# Patient Record
Sex: Male | Born: 1990
Health system: Southern US, Community
[De-identification: ages and names within clinical notes are randomized; demographics above are authoritative.]

## PROBLEM LIST (undated history)

## (undated) DIAGNOSIS — M1990 Unspecified osteoarthritis, unspecified site: Secondary | ICD-10-CM

## (undated) DIAGNOSIS — S0990XA Unspecified injury of head, initial encounter: Secondary | ICD-10-CM

## (undated) DIAGNOSIS — F419 Anxiety disorder, unspecified: Secondary | ICD-10-CM

## (undated) DIAGNOSIS — R4689 Other symptoms and signs involving appearance and behavior: Secondary | ICD-10-CM

## (undated) DIAGNOSIS — K5641 Fecal impaction: Secondary | ICD-10-CM

## (undated) DIAGNOSIS — F1910 Other psychoactive substance abuse, uncomplicated: Secondary | ICD-10-CM

## (undated) DIAGNOSIS — F329 Major depressive disorder, single episode, unspecified: Secondary | ICD-10-CM

## (undated) DIAGNOSIS — M549 Dorsalgia, unspecified: Secondary | ICD-10-CM

## (undated) DIAGNOSIS — T401X1A Poisoning by heroin, accidental (unintentional), initial encounter: Secondary | ICD-10-CM

## (undated) DIAGNOSIS — I429 Cardiomyopathy, unspecified: Secondary | ICD-10-CM

## (undated) DIAGNOSIS — J309 Allergic rhinitis, unspecified: Secondary | ICD-10-CM

## (undated) DIAGNOSIS — T1491XA Suicide attempt, initial encounter: Secondary | ICD-10-CM

## (undated) DIAGNOSIS — F32A Depression, unspecified: Secondary | ICD-10-CM

## (undated) DIAGNOSIS — F988 Other specified behavioral and emotional disorders with onset usually occurring in childhood and adolescence: Secondary | ICD-10-CM

## (undated) DIAGNOSIS — K5900 Constipation, unspecified: Secondary | ICD-10-CM

## (undated) DIAGNOSIS — G8929 Other chronic pain: Secondary | ICD-10-CM

## (undated) HISTORY — DX: Other specified behavioral and emotional disorders with onset usually occurring in childhood and adolescence: F98.8

## (undated) HISTORY — PX: FOOT SURGERY: SHX648

## (undated) HISTORY — PX: NO PAST SURGERIES: SHX2092

## (undated) HISTORY — DX: Allergic rhinitis, unspecified: J30.9

---
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
## 2010-07-23 ENCOUNTER — Emergency Department (HOSPITAL_COMMUNITY): Payer: Medicaid Other

## 2010-07-23 ENCOUNTER — Emergency Department (HOSPITAL_COMMUNITY)
Admission: EM | Admit: 2010-07-23 | Discharge: 2010-07-23 | Disposition: A | Discharge status: Home / Self Care | Payer: Medicaid Other | Attending: Emergency Medicine | Admitting: Emergency Medicine

## 2010-07-23 DIAGNOSIS — W138XXA Fall from, out of or through other building or structure, initial encounter: Secondary | ICD-10-CM | POA: Insufficient documentation

## 2010-07-23 DIAGNOSIS — Y92009 Unspecified place in unspecified non-institutional (private) residence as the place of occurrence of the external cause: Secondary | ICD-10-CM | POA: Insufficient documentation

## 2010-07-23 DIAGNOSIS — IMO0002 Reserved for concepts with insufficient information to code with codable children: Secondary | ICD-10-CM | POA: Insufficient documentation

## 2010-07-23 DIAGNOSIS — R109 Unspecified abdominal pain: Secondary | ICD-10-CM | POA: Insufficient documentation

## 2010-07-23 DIAGNOSIS — F17200 Nicotine dependence, unspecified, uncomplicated: Secondary | ICD-10-CM | POA: Insufficient documentation

## 2010-07-23 MED ORDER — IOHEXOL 300 MG/ML  SOLN
100.0000 mL | Freq: Once | INTRAMUSCULAR | Status: AC | PRN
  Administered 2010-07-23: 100 mL via INTRAVENOUS

## 2010-07-24 ENCOUNTER — Emergency Department (HOSPITAL_COMMUNITY): Payer: Medicaid Other

## 2010-07-24 ENCOUNTER — Emergency Department (HOSPITAL_COMMUNITY)
Admission: EM | Admit: 2010-07-24 | Discharge: 2010-07-25 | Disposition: A | Discharge status: Home / Self Care | Payer: Medicaid Other | Attending: Emergency Medicine | Admitting: Emergency Medicine

## 2010-07-24 DIAGNOSIS — S02609A Fracture of mandible, unspecified, initial encounter for closed fracture: Secondary | ICD-10-CM | POA: Insufficient documentation

## 2010-07-24 DIAGNOSIS — M79609 Pain in unspecified limb: Secondary | ICD-10-CM | POA: Insufficient documentation

## 2010-07-24 DIAGNOSIS — W1789XA Other fall from one level to another, initial encounter: Secondary | ICD-10-CM | POA: Insufficient documentation

## 2010-07-25 ENCOUNTER — Emergency Department (HOSPITAL_COMMUNITY)
Admission: EM | Admit: 2010-07-25 | Discharge: 2010-07-25 | Disposition: A | Discharge status: Home / Self Care | Payer: Medicaid Other | Attending: Emergency Medicine | Admitting: Emergency Medicine

## 2010-07-25 DIAGNOSIS — S02609A Fracture of mandible, unspecified, initial encounter for closed fracture: Secondary | ICD-10-CM | POA: Insufficient documentation

## 2010-07-25 DIAGNOSIS — R221 Localized swelling, mass and lump, neck: Secondary | ICD-10-CM | POA: Insufficient documentation

## 2010-07-25 DIAGNOSIS — R51 Headache: Secondary | ICD-10-CM | POA: Insufficient documentation

## 2010-07-25 DIAGNOSIS — W19XXXA Unspecified fall, initial encounter: Secondary | ICD-10-CM | POA: Insufficient documentation

## 2010-07-25 DIAGNOSIS — R220 Localized swelling, mass and lump, head: Secondary | ICD-10-CM | POA: Insufficient documentation

## 2010-07-25 DIAGNOSIS — R209 Unspecified disturbances of skin sensation: Secondary | ICD-10-CM | POA: Insufficient documentation

## 2010-07-25 DIAGNOSIS — R259 Unspecified abnormal involuntary movements: Secondary | ICD-10-CM | POA: Insufficient documentation

## 2010-08-12 NOTE — Consult Note (Signed)
  NAMERITO, LECOMTE              ACCOUNT NO.:  [id number]  MEDICAL RECORD NO.:  [id number]  LOCATION:  [facility]                         FACILITY:  [facility]  PHYSICIAN:  Kristine Garbe. Ezzard Standing, M.D.DATE OF BIRTH:  09-02-1990  DATE OF CONSULTATION:  07/25/2010 DATE OF DISCHARGE:  07/25/2010                                CONSULTATION   REASON FOR CONSULTATION:  Evaluate the patient with mandibular fracture.  BRIEF NOTE:  Philip Thompson is a 20 year old gentleman who was seen at [facility] last night, where he had a CT scan that showed mandible fracture.  He apparently fell while fishing 3 days ago on July 22, 2010, landing on his face.  He was initially seen in the hospital and sent home on pain medicine but because of persistent pain in his jaw, went back to the hospital on July 24, 2010, at which time he had a CT scan which shows bilateral subcondylar fractures, mandibular neck fractures, and a nondisplaced right body fracture.  He presents here for examination and review of the films.  He is otherwise healthy.  He complains of some numbness on the right side of his face as well as pain and discomfort and a little bit trismus opening his mouth.  PHYSICAL EXAMINATION:  He has minimal swelling.  There is no intraoral openings or lacerations along the jaw or no bleeding.  He has a small laceration of his tongue.  He has had several dental extractions but on bite, his teeth lined up fairly well with a good positioning of teeth. The left neck fracture is nondisplaced, right neck fracture has maybe a millimeter of displacement, but is fairly well lined.  The body fracture is cracked, but really no displacement.  IMPRESSION:  Bilateral nondisplaced subcondylar fractures and the right body fracture, nondisplaced.  RECOMMENDATION:  I discussed with Philip Thompson that I thick we can probably treat this conservatively with a liquid diet, antibiotics, and pain medicine, and reviewed this with him.  I  will have him follow up in my office in 1 week for recheck and Panorex.          ______________________________ Kristine Garbe Ezzard Standing, M.D.     CEN/MEDQ  D:  07/25/2010  T:  07/25/2010  Job:  [id number]  Electronically Signed by Dillard Cannon M.D. on 08/12/2010 11:40:01 AM

## 2010-12-12 ENCOUNTER — Other Ambulatory Visit (HOSPITAL_COMMUNITY): Payer: Self-pay | Admitting: Psychiatry

## 2011-06-21 ENCOUNTER — Ambulatory Visit (HOSPITAL_COMMUNITY): Payer: Medicaid Other | Admitting: Physical Therapy

## 2012-04-10 ENCOUNTER — Other Ambulatory Visit: Payer: Self-pay

## 2012-04-10 MED ORDER — CYCLOBENZAPRINE HCL 10 MG PO TABS: 10.0000 mg | ORAL_TABLET | Freq: Three times a day (TID) | ORAL | Status: DC | PRN

## 2012-06-30 ENCOUNTER — Encounter: Payer: Self-pay | Admitting: *Deleted

## 2012-07-05 ENCOUNTER — Ambulatory Visit (INDEPENDENT_AMBULATORY_CARE_PROVIDER_SITE_OTHER): Payer: Medicaid Other | Admitting: Family Medicine

## 2012-07-05 ENCOUNTER — Encounter: Payer: Self-pay | Admitting: Family Medicine

## 2012-07-05 VITALS — BP 120/80 | Wt 173.0 lb

## 2012-07-05 DIAGNOSIS — M549 Dorsalgia, unspecified: Secondary | ICD-10-CM

## 2012-07-05 DIAGNOSIS — G8929 Other chronic pain: Secondary | ICD-10-CM

## 2012-07-05 MED ORDER — CYCLOBENZAPRINE HCL 10 MG PO TABS: 10.0000 mg | ORAL_TABLET | Freq: Three times a day (TID) | ORAL | Status: DC | PRN

## 2012-07-05 MED ORDER — HYDROCODONE-ACETAMINOPHEN 10-325 MG PO TABS: 1.0000 | ORAL_TABLET | Freq: Four times a day (QID) | ORAL | Status: DC | PRN

## 2012-07-05 NOTE — Progress Notes (Signed)
  Subjective:    Patient ID: Philip Thompson, male    DOB: July 06, 1990, 22 y.o.   MRN: [id number]  HPI Patient arrives at office for follow up on muscle spasms. Condition has occurred off and on for a year. They states low back pain he's had MRI in the past as having dramatically changed has constant pressure pain discomfort certain movements make it worse does a lot of physical labor PMH benign family history noncontributory  Review of Systems Negative abdominal pain rectal bleeding hematuria no numbness or tingling down the legs    Objective:   Physical Exam  Lungs clear hearts regular abdomen soft low back moderate tenderness negative straight leg raise reflexes good      Assessment & Plan:  Chronic pain-hydrocodone 4 times daily patient was talked to at length about the importance of following medication not abusing the medication not combining it with other things. He understands he needs to followup in approximately 4 months and adherent to followup here for further medication. Flexeril may be used only at nighttime

## 2012-07-05 NOTE — Patient Instructions (Signed)
As part of your visit today we have covered chronic pain. You have been given prescription for pain medicines. Certain pain medications such as hydrocodone  allow for refills. Other pain medications such as oxycodone require separate prescriptions. Nonetheless, your expected to come in for a office visit before further pain medications are issued.   We will not refill medications or early nor will we give an extended month supply at the end of these prescriptions.It is your responsibility to keep up with medications. They will not be replaced.  It is your responsibility to schedule an office visit in 3-4 months to be seen before you are out of your medication. Do not call our office to request early refills or additional refills.   We are required by law to adhere to regulations. Failure on our part to follow these regulations could jeopardize our prescription license which in turn would cause us not to be able to care for you.  

## 2012-07-07 ENCOUNTER — Ambulatory Visit: Payer: Self-pay | Admitting: Nurse Practitioner

## 2012-07-10 ENCOUNTER — Ambulatory Visit: Payer: Self-pay | Admitting: Nurse Practitioner

## 2012-08-04 ENCOUNTER — Telehealth: Payer: Self-pay | Admitting: Family Medicine

## 2012-08-04 MED ORDER — LACTULOSE 10 GM/15ML PO SOLN: ORAL | Status: DC

## 2012-08-04 NOTE — Telephone Encounter (Signed)
Sent in Lactulose liq one tblspoon bid for three days, then daily 300 ccs to [facility] in [place]. Mom was notified.

## 2012-08-04 NOTE — Telephone Encounter (Signed)
Patient needs something called in for patients constipation. Mom states he is severely backed up. Cannot afford appointment at this time.    [facility] in [place]

## 2012-08-04 NOTE — Telephone Encounter (Signed)
Lactulose liq one tblspoon bid for three days, then daily 300 ccs

## 2012-09-13 ENCOUNTER — Other Ambulatory Visit: Payer: Self-pay | Admitting: *Deleted

## 2012-09-13 MED ORDER — CYCLOBENZAPRINE HCL 10 MG PO TABS: 10.0000 mg | ORAL_TABLET | Freq: Three times a day (TID) | ORAL | Status: DC | PRN

## 2012-11-13 ENCOUNTER — Other Ambulatory Visit: Payer: Self-pay

## 2012-11-13 MED ORDER — CYCLOBENZAPRINE HCL 10 MG PO TABS: 10.0000 mg | ORAL_TABLET | Freq: Three times a day (TID) | ORAL | Status: DC | PRN

## 2012-11-27 ENCOUNTER — Encounter: Payer: Self-pay | Admitting: Family Medicine

## 2012-11-27 ENCOUNTER — Ambulatory Visit (INDEPENDENT_AMBULATORY_CARE_PROVIDER_SITE_OTHER): Payer: Medicaid Other | Admitting: Family Medicine

## 2012-11-27 VITALS — BP 132/78 | Ht 68.0 in | Wt 186.0 lb

## 2012-11-27 DIAGNOSIS — G8929 Other chronic pain: Secondary | ICD-10-CM

## 2012-11-27 DIAGNOSIS — M549 Dorsalgia, unspecified: Secondary | ICD-10-CM

## 2012-11-27 MED ORDER — HYDROCODONE-ACETAMINOPHEN 10-325 MG PO TABS: 1.0000 | ORAL_TABLET | Freq: Four times a day (QID) | ORAL | Status: DC | PRN

## 2012-11-27 MED ORDER — ZOLPIDEM TARTRATE 10 MG PO TABS: 10.0000 mg | ORAL_TABLET | Freq: Every evening | ORAL | Status: DC | PRN

## 2012-11-27 NOTE — Progress Notes (Signed)
  Subjective:    Patient ID: Philip Thompson, male    DOB: June 29, 1990, 22 y.o.   MRN: [id number]  Back Pain The current episode started more than 1 year ago. He has tried muscle relaxant (hydrocodone) for the symptoms.  This patient was seen today for chronic pain  The medication list was reviewed and updated.  Discussion was held with the patient regarding compliance with pain medication. The patient was advised the importance of maintaining medication and not using illegal substances with these. The patient was educated that we can provide 3 monthly scripts for their medication, it is their responsibility to follow the instructions. Discussion was held with the patient to make sure they're not having significant side effects. Patient is aware that pain medications are meant to minimize the severity of the pain to allow their pain levels to improve to allow for better function. They are aware of that pain medications cannot totally remove their pain.    Wants to discuss a medication to help sleep. He stopped trazadone because it gave him nightmares.  We discussed this at length. He would like to try other options. Discussed Ambien including side effects and the need to do but at least 8 hours he agrees to Ambien. He denies abusing medications.  Review of Systems  Musculoskeletal: Positive for back pain.   denies chest pain shortness breath nausea vomiting diarrhea.     Objective:   Physical Exam Lungs are clear hearts regular pulse normal blood pressure good subjective discomfort in his lower back negative straight leg raise       Assessment & Plan:  Chronic back pain-hydrocodone 3 prescriptions were given followup ongoing troubles proper lifting mechanics discussed Insomnia Ambien 10 mg at nighttime as necessary to help sleep devote at least 8 hours toward sleep when using this medicine

## 2012-12-19 ENCOUNTER — Other Ambulatory Visit: Payer: Self-pay | Admitting: *Deleted

## 2012-12-19 MED ORDER — ZOLPIDEM TARTRATE 10 MG PO TABS: 10.0000 mg | ORAL_TABLET | Freq: Every evening | ORAL | Status: DC | PRN

## 2013-02-20 ENCOUNTER — Ambulatory Visit (INDEPENDENT_AMBULATORY_CARE_PROVIDER_SITE_OTHER): Payer: Self-pay | Admitting: Family Medicine

## 2013-02-20 ENCOUNTER — Encounter: Payer: Self-pay | Admitting: Family Medicine

## 2013-02-20 VITALS — BP 132/78 | Ht 67.75 in | Wt 173.0 lb

## 2013-02-20 DIAGNOSIS — G4700 Insomnia, unspecified: Secondary | ICD-10-CM | POA: Insufficient documentation

## 2013-02-20 DIAGNOSIS — M549 Dorsalgia, unspecified: Secondary | ICD-10-CM

## 2013-02-20 DIAGNOSIS — G8929 Other chronic pain: Secondary | ICD-10-CM

## 2013-02-20 MED ORDER — ZOLPIDEM TARTRATE 10 MG PO TABS: 10.0000 mg | ORAL_TABLET | Freq: Every evening | ORAL | Status: DC | PRN

## 2013-02-20 MED ORDER — HYDROCODONE-ACETAMINOPHEN 10-325 MG PO TABS: 1.0000 | ORAL_TABLET | Freq: Four times a day (QID) | ORAL | Status: DC | PRN

## 2013-02-20 MED ORDER — HYDROCODONE-ACETAMINOPHEN 10-325 MG PO TABS
1.0000 | ORAL_TABLET | Freq: Four times a day (QID) | ORAL | Status: DC | PRN
Start: 2013-02-20 — End: 2013-05-23

## 2013-02-20 NOTE — Progress Notes (Signed)
   Subjective:    Patient ID: Philip Thompson, male    DOB: 06-12-1990, 23 y.o.   MRN: [id number]  HPIFollow up back pain. No concerns. Needs refills on pain meds and on ambien.  Patient relates that he does a good job taking his medicines. He denies abusing them. Uses between 3 and 4 per day. He states he is going to try to wean himself off of pain medicines over the next several months. He is interested in flying planes. He also uses Ambien at nighttime to help with sleep. PMH benign chronic pain   Review of Systems  Constitutional: Negative for activity change, appetite change and fatigue.  Respiratory: Negative for cough.   Cardiovascular: Negative for chest pain.  Gastrointestinal: Negative for abdominal pain.  Neurological: Negative for headaches.  Psychiatric/Behavioral: Negative for behavioral problems.       Objective:   Physical Exam  Vitals reviewed. Constitutional: He appears well-nourished.  Cardiovascular: Normal rate, regular rhythm and normal heart sounds.   No murmur heard. Pulmonary/Chest: Effort normal and breath sounds normal.  Musculoskeletal: He exhibits no edema.  Has subjective discomfort in the low back negative straight leg raise good flexibility  Lymphadenopathy:    He has no cervical adenopathy.  Neurological: He is alert.  Psychiatric: His behavior is normal.          Assessment & Plan:  Patient has chronic back pain he will use his pain medicine followup again in 3-4 months he states he is going try to wean himself off of pain medicines. Hopefully they'll be sometime later this year.  Insomnia use Ambien when necessary

## 2013-05-23 ENCOUNTER — Ambulatory Visit (INDEPENDENT_AMBULATORY_CARE_PROVIDER_SITE_OTHER): Payer: Self-pay | Admitting: Family Medicine

## 2013-05-23 ENCOUNTER — Encounter: Payer: Self-pay | Admitting: Family Medicine

## 2013-05-23 VITALS — BP 132/86 | Ht 69.0 in | Wt 180.2 lb

## 2013-05-23 DIAGNOSIS — G8929 Other chronic pain: Secondary | ICD-10-CM

## 2013-05-23 DIAGNOSIS — M549 Dorsalgia, unspecified: Secondary | ICD-10-CM

## 2013-05-23 DIAGNOSIS — J309 Allergic rhinitis, unspecified: Secondary | ICD-10-CM

## 2013-05-23 MED ORDER — HYDROCODONE-ACETAMINOPHEN 10-325 MG PO TABS: 1.0000 | ORAL_TABLET | Freq: Four times a day (QID) | ORAL | Status: DC | PRN

## 2013-05-23 MED ORDER — ZOLPIDEM TARTRATE 10 MG PO TABS: 10.0000 mg | ORAL_TABLET | Freq: Every evening | ORAL | Status: DC | PRN

## 2013-05-23 MED ORDER — FLUTICASONE PROPIONATE 50 MCG/ACT NA SUSP: 2.0000 | Freq: Every day | NASAL | Status: DC

## 2013-05-23 NOTE — Progress Notes (Signed)
   Subjective:    Patient ID: Philip Thompson, male    DOB: [birth year], 23 y.o.   MRN: [id number]  HPI Patient arrives for a follow up on pain management. Patient states he is taking 2-3 hydrocodone a day-has not weaned down yet but open to discussing what you think about this.  Patient stated he would also like something for his allergies.   Review of Systems He denies drowsiness nausea vomiting diarrhea denies numbness just relates low back pain.    Objective:   Physical Exam Lungs are clear heart regular pulse normal subjective discomfort back negative straight leg raise       Assessment & Plan:  Chronic back pain he uses anywhere between 3 and 4 pain medications per day 3 prescriptions for 120 were given patient denies abusing it. He will followup when he needs any prescription. Prescriptions were verified with his drugstore he is feeling them responsibly  Moderate allergy OTC measures measures as well as Flonase discuss

## 2013-06-21 ENCOUNTER — Other Ambulatory Visit: Payer: Self-pay | Admitting: Family Medicine

## 2013-07-25 ENCOUNTER — Ambulatory Visit (INDEPENDENT_AMBULATORY_CARE_PROVIDER_SITE_OTHER): Payer: BC Managed Care – PPO | Admitting: Family Medicine

## 2013-07-25 ENCOUNTER — Encounter: Payer: Self-pay | Admitting: Family Medicine

## 2013-07-25 VITALS — BP 132/70 | Temp 98.0°F | Ht 69.0 in | Wt 175.0 lb

## 2013-07-25 DIAGNOSIS — J329 Chronic sinusitis, unspecified: Secondary | ICD-10-CM

## 2013-07-25 MED ORDER — BENZONATATE 100 MG PO CAPS: 100.0000 mg | ORAL_CAPSULE | Freq: Three times a day (TID) | ORAL | Status: DC | PRN

## 2013-07-25 MED ORDER — CEFPROZIL 500 MG PO TABS: 500.0000 mg | ORAL_TABLET | Freq: Two times a day (BID) | ORAL | Status: DC

## 2013-07-25 NOTE — Progress Notes (Signed)
   Subjective:    Patient ID: Philip Thompson, male    DOB: 1990-11-19, 23 y.o.   MRN: [id number]  Cough This is a new problem. The current episode started in the past 7 days. Associated symptoms include headaches, myalgias and wheezing. He has tried nothing for the symptoms.   Started a week ago,  First began with cough, real bad sor e thro and runnny nose  Felt bad aching and pain and muscle pain and tenderness  No energy  Coughing up phlegm  No one else sick at home    Review of Systems  Respiratory: Positive for cough and wheezing.   Musculoskeletal: Positive for myalgias.  Neurological: Positive for headaches.       Objective:   Physical Exam  Alert moderate malaise. Good hydration. Neck supple. Pharynx erythematous. Tender anterior nodes. Nasal discharge yellowish. Lungs clear. Heart regular in rhythm.      Assessment & Plan:  Impression rhinosinusitis with lymphadenitis plan antibiotics prescribed. Symptomatic care discussed. WSL

## 2013-08-20 ENCOUNTER — Encounter: Payer: Self-pay | Admitting: Family Medicine

## 2013-08-20 ENCOUNTER — Ambulatory Visit (INDEPENDENT_AMBULATORY_CARE_PROVIDER_SITE_OTHER): Payer: BC Managed Care – PPO | Admitting: Family Medicine

## 2013-08-20 VITALS — BP 120/72 | Ht 69.0 in | Wt 171.0 lb

## 2013-08-20 DIAGNOSIS — G8929 Other chronic pain: Secondary | ICD-10-CM

## 2013-08-20 DIAGNOSIS — M549 Dorsalgia, unspecified: Secondary | ICD-10-CM

## 2013-08-20 MED ORDER — HYDROCODONE-ACETAMINOPHEN 10-325 MG PO TABS: 1.0000 | ORAL_TABLET | Freq: Four times a day (QID) | ORAL | Status: DC | PRN

## 2013-08-20 MED ORDER — FLUTICASONE PROPIONATE 50 MCG/ACT NA SUSP: 2.0000 | Freq: Every day | NASAL | Status: DC

## 2013-08-20 MED ORDER — HYDROCODONE-ACETAMINOPHEN 10-325 MG PO TABS
1.0000 | ORAL_TABLET | Freq: Four times a day (QID) | ORAL | Status: DC | PRN
Start: 2013-08-20 — End: 2013-08-20

## 2013-08-20 NOTE — Progress Notes (Signed)
   Subjective:    Patient ID: Philip Thompson, male    DOB: 09/16/1990, 23 y.o.   MRN: [id number]  HPI This patient was seen today for chronic pain  The medication list was reviewed and updated.   -Compliance with pain medication: YES  The patient was advised the importance of maintaining medication and not using illegal substances with these.  Refills needed: YES on Hydrocodone and Flonase  The patient was educated that we can provide 3 monthly scripts for their medication, it is their responsibility to follow the instructions.  Side effects or complications from medications: none  Patient is aware that pain medications are meant to minimize the severity of the pain to allow their pain levels to improve to allow for better function. They are aware of that pain medications cannot totally remove their pain.  Due for UDT ( at least once per year) : this fall        Review of Systems He denies any numbness or radiation down the legs he does relate low back pain    Objective:   Physical Exam  Vitals reviewed. Constitutional: He appears well-nourished.  Cardiovascular: Normal rate, regular rhythm and normal heart sounds.   No murmur heard. Pulmonary/Chest: Effort normal and breath sounds normal.  Musculoskeletal: He exhibits no edema.  Lymphadenopathy:    He has no cervical adenopathy.  Neurological: He is alert.  Psychiatric: His behavior is normal.   Patient has subjective pain and discomfort in his lower back. Patient had abnormal MRI several years ago.      Assessment & Plan:  Chronic back pain this is been stable pain medication is helping continue current pain medication. Prescriptions given followup 3 months.  Patient counseled to quit smoking

## 2013-08-27 ENCOUNTER — Encounter (HOSPITAL_COMMUNITY): Payer: Self-pay | Admitting: Emergency Medicine

## 2013-08-27 ENCOUNTER — Inpatient Hospital Stay (HOSPITAL_COMMUNITY)
Admission: EM | Admit: 2013-08-27 | Discharge: 2013-08-30 | DRG: 917 | Discharge status: Against Medical Advice | Payer: BC Managed Care – PPO | Attending: Internal Medicine | Admitting: Internal Medicine

## 2013-08-27 ENCOUNTER — Emergency Department (HOSPITAL_COMMUNITY): Payer: BC Managed Care – PPO

## 2013-08-27 DIAGNOSIS — F603 Borderline personality disorder: Secondary | ICD-10-CM | POA: Diagnosis present

## 2013-08-27 DIAGNOSIS — F10921 Alcohol use, unspecified with intoxication delirium: Secondary | ICD-10-CM

## 2013-08-27 DIAGNOSIS — F1010 Alcohol abuse, uncomplicated: Secondary | ICD-10-CM | POA: Diagnosis not present

## 2013-08-27 DIAGNOSIS — F1210 Cannabis abuse, uncomplicated: Secondary | ICD-10-CM | POA: Diagnosis present

## 2013-08-27 DIAGNOSIS — F10929 Alcohol use, unspecified with intoxication, unspecified: Secondary | ICD-10-CM | POA: Diagnosis present

## 2013-08-27 DIAGNOSIS — R45851 Suicidal ideations: Secondary | ICD-10-CM

## 2013-08-27 DIAGNOSIS — R45850 Homicidal ideations: Secondary | ICD-10-CM

## 2013-08-27 DIAGNOSIS — R4182 Altered mental status, unspecified: Secondary | ICD-10-CM | POA: Diagnosis not present

## 2013-08-27 DIAGNOSIS — G8929 Other chronic pain: Secondary | ICD-10-CM

## 2013-08-27 DIAGNOSIS — F1994 Other psychoactive substance use, unspecified with psychoactive substance-induced mood disorder: Secondary | ICD-10-CM | POA: Diagnosis not present

## 2013-08-27 DIAGNOSIS — F17200 Nicotine dependence, unspecified, uncomplicated: Secondary | ICD-10-CM | POA: Diagnosis present

## 2013-08-27 DIAGNOSIS — T50904S Poisoning by unspecified drugs, medicaments and biological substances, undetermined, sequela: Secondary | ICD-10-CM

## 2013-08-27 DIAGNOSIS — F988 Other specified behavioral and emotional disorders with onset usually occurring in childhood and adolescence: Secondary | ICD-10-CM | POA: Diagnosis present

## 2013-08-27 DIAGNOSIS — T50901A Poisoning by unspecified drugs, medicaments and biological substances, accidental (unintentional), initial encounter: Principal | ICD-10-CM | POA: Diagnosis present

## 2013-08-27 DIAGNOSIS — T50902A Poisoning by unspecified drugs, medicaments and biological substances, intentional self-harm, initial encounter: Secondary | ICD-10-CM | POA: Diagnosis present

## 2013-08-27 DIAGNOSIS — I10 Essential (primary) hypertension: Secondary | ICD-10-CM | POA: Diagnosis present

## 2013-08-27 DIAGNOSIS — M549 Dorsalgia, unspecified: Secondary | ICD-10-CM

## 2013-08-27 DIAGNOSIS — T40601A Poisoning by unspecified narcotics, accidental (unintentional), initial encounter: Secondary | ICD-10-CM

## 2013-08-27 DIAGNOSIS — Z781 Physical restraint status: Secondary | ICD-10-CM | POA: Diagnosis present

## 2013-08-27 DIAGNOSIS — F1910 Other psychoactive substance abuse, uncomplicated: Secondary | ICD-10-CM | POA: Diagnosis not present

## 2013-08-27 DIAGNOSIS — R4689 Other symptoms and signs involving appearance and behavior: Secondary | ICD-10-CM | POA: Diagnosis present

## 2013-08-27 DIAGNOSIS — G9340 Encephalopathy, unspecified: Secondary | ICD-10-CM | POA: Diagnosis present

## 2013-08-27 DIAGNOSIS — E870 Hyperosmolality and hypernatremia: Secondary | ICD-10-CM | POA: Diagnosis present

## 2013-08-27 DIAGNOSIS — E876 Hypokalemia: Secondary | ICD-10-CM | POA: Diagnosis present

## 2013-08-27 LAB — URINALYSIS, ROUTINE W REFLEX MICROSCOPIC
Bilirubin Urine: NEGATIVE
Glucose, UA: NEGATIVE mg/dL
HGB URINE DIPSTICK: NEGATIVE
Ketones, ur: NEGATIVE mg/dL
LEUKOCYTES UA: NEGATIVE
Nitrite: NEGATIVE
PH: 5.5 (ref 5.0–8.0)
Protein, ur: NEGATIVE mg/dL
SPECIFIC GRAVITY, URINE: 1.01 (ref 1.005–1.030)
Urobilinogen, UA: 0.2 mg/dL (ref 0.0–1.0)

## 2013-08-27 LAB — COMPREHENSIVE METABOLIC PANEL
ALK PHOS: 88 U/L (ref 39–117)
ALT: 13 U/L (ref 0–53)
AST: 13 U/L (ref 0–37)
Albumin: 4 g/dL (ref 3.5–5.2)
Anion gap: 16 — ABNORMAL HIGH (ref 5–15)
BUN: 5 mg/dL — ABNORMAL LOW (ref 6–23)
CO2: 20 mEq/L (ref 19–32)
Calcium: 8.5 mg/dL (ref 8.4–10.5)
Chloride: 111 mEq/L (ref 96–112)
Creatinine, Ser: 0.93 mg/dL (ref 0.50–1.35)
GFR calc Af Amer: >90 mL/min (ref >90)
GLUCOSE: 111 mg/dL — AB (ref 70–99)
POTASSIUM: 3.2 meq/L — AB (ref 3.7–5.3)
Sodium: 147 mEq/L (ref 137–147)
Total Bilirubin: 0.2 mg/dL — ABNORMAL LOW (ref 0.3–1.2)
Total Protein: 6.7 g/dL (ref 6.0–8.3)

## 2013-08-27 LAB — LIPASE, BLOOD: Lipase: 38 U/L (ref 11–59)

## 2013-08-27 LAB — CBC WITH DIFFERENTIAL/PLATELET
Basophils Absolute: 0 10*3/uL (ref 0.0–0.1)
Basophils Relative: 0 % (ref 0–1)
EOS ABS: 0 10*3/uL (ref 0.0–0.7)
Eosinophils Relative: 0 % (ref 0–5)
HEMATOCRIT: 43.7 % (ref 39.0–52.0)
Hemoglobin: 14.8 g/dL (ref 13.0–17.0)
LYMPHS ABS: 1.1 10*3/uL (ref 0.7–4.0)
Lymphocytes Relative: 16 % (ref 12–46)
MCH: 28.8 pg (ref 26.0–34.0)
MCHC: 33.9 g/dL (ref 30.0–36.0)
MCV: 85.2 fL (ref 78.0–100.0)
Monocytes Absolute: 0.7 10*3/uL (ref 0.1–1.0)
Monocytes Relative: 10 % (ref 3–12)
Neutro Abs: 4.9 10*3/uL (ref 1.7–7.7)
Neutrophils Relative %: 74 % (ref 43–77)
PLATELETS: 161 10*3/uL (ref 150–400)
RBC: 5.13 MIL/uL (ref 4.22–5.81)
RDW: 13.6 % (ref 11.5–15.5)
WBC: 6.7 10*3/uL (ref 4.0–10.5)

## 2013-08-27 LAB — RAPID URINE DRUG SCREEN, HOSP PERFORMED
Amphetamines: NOT DETECTED
Barbiturates: NOT DETECTED
Benzodiazepines: NOT DETECTED
Cocaine: NOT DETECTED
Opiates: NOT DETECTED
TETRAHYDROCANNABINOL: POSITIVE — AB

## 2013-08-27 LAB — ETHANOL: ALCOHOL ETHYL (B): 255 mg/dL — AB (ref 0–11)

## 2013-08-27 MED ORDER — SODIUM CHLORIDE 0.9 % IV SOLN
INTRAVENOUS | Status: DC
  Administered 2013-08-27: 23:00:00 via INTRAVENOUS

## 2013-08-27 MED ORDER — ZIPRASIDONE MESYLATE 20 MG IM SOLR
20.0000 mg | Freq: Once | INTRAMUSCULAR | Status: AC
  Administered 2013-08-27: 20 mg via INTRAMUSCULAR

## 2013-08-27 MED ORDER — ZIPRASIDONE MESYLATE 20 MG IM SOLR
INTRAMUSCULAR | Status: AC
  Administered 2013-08-27: 20 mg via INTRAMUSCULAR
  Filled 2013-08-27: qty 20

## 2013-08-27 MED ORDER — STERILE WATER FOR INJECTION IJ SOLN
INTRAMUSCULAR | Status: AC
  Filled 2013-08-27: qty 10

## 2013-08-27 MED ORDER — SODIUM CHLORIDE 0.9 % IV BOLUS (SEPSIS)
500.0000 mL | Freq: Once | INTRAVENOUS | Status: AC
  Administered 2013-08-27: 500 mL via INTRAVENOUS

## 2013-08-27 MED ORDER — LORAZEPAM 2 MG/ML IJ SOLN
2.0000 mg | Freq: Once | INTRAMUSCULAR | Status: AC
  Administered 2013-08-28: 2 mg via INTRAVENOUS
  Filled 2013-08-27: qty 1

## 2013-08-27 MED ORDER — STERILE WATER FOR INJECTION IJ SOLN
INTRAMUSCULAR | Status: AC
  Administered 2013-08-27: 22:00:00
  Filled 2013-08-27: qty 10

## 2013-08-27 NOTE — ED Notes (Signed)
Pt continues to be aggressive and fighting in the bed. EDP made aware. Stated no more sedation at this time.

## 2013-08-27 NOTE — ED Notes (Signed)
Pt not following commands; responsive to pain. Will not open eyes. Pt aggressive in the bed, kicking and throwing arms. Sheriff's officer has pt in shackles and handcuffed to the bed with spit guard. . No known or observed drug ingestion. Per officer: pt's family reports pt threatening to kill himself and others.

## 2013-08-27 NOTE — ED Notes (Signed)
Pt becoming more verbal. Language is incomprehensible with occasional word coming through "Ya'' got me in [place] jail." Will not follow commands or open eyes to verbal stimuli. Still fighting in the stretcher. Unable to calm pt. Sheriff's officer and security remain at bedside with pt in four point handcuff restraints by Emergency planning/management officer.

## 2013-08-27 NOTE — ED Notes (Signed)
Per EMS: called in by neighbor for possible overdose. Pt combative, not verbally responsive. Escorted by Engineer, manufacturing systems in [place]. Hx of coccaine use. EMS gave 1 mg of narcan with no response. Per sheriff's officer: pt has been "acting out all night" per the father. Threatening to kill everyone and then began taking various drugs". ETOH on board.

## 2013-08-27 NOTE — ED Provider Notes (Addendum)
CSN: [id number]     Arrival date & time 08/27/13  2145 History  This chart was scribed for Philip Mulders, MD by Elon Spanner, ED Scribe. This patient was seen in room APA01/APA01 and the patient's care was started at 9:54 PM.   Chief Complaint  Patient presents with  . Altered Mental Status   LEVEL 5 CAVEAT (ALTERED MENTAL STATUS)  The history is provided by the EMS personnel and the police. No language interpreter was used.    HPI Comments: Philip Thompson is a 23 y.o. male brought in by ambulance, who presents to the Emergency Department complaining of altered mental status.  Per EMS, the sheriff's department responded to the patient's home after being called by the patient's neighbor earlier today following constant erratic behavior during which he expressed suicidal ideation with no specific plan as well as homocidal ideation and active threats toward his family members.  EMS gave 1 mg of Narcan with no response.  EMS states that the patient's father noticed some medication missing from the bathroom cabinet and EMS found 3 pill bottles, Flexeril, Norvasc, and Lasix, at the home.  The flexeril was empty.   EMS states the patient is currently intoxicated.  Per the sheriff's department the patient has a history of previous episodes of similar behavior as well as polysubstance abuse including crack/cocaine, alcohol, and opiates.      Past Medical History  Diagnosis Date  . ADD (attention deficit disorder)   . Allergic rhinitis    History reviewed. No pertinent past surgical history. No family history on file. History  Substance Use Topics  . Smoking status: Current Every Day Smoker  . Smokeless tobacco: Not on file  . Alcohol Use: Yes    Review of Systems  Unable to perform ROS: Mental status change      Allergies  Review of patient's allergies indicates no known allergies.  Home Medications   Prior to Admission medications   Medication Sig Start Date End Date Taking?  Authorizing Provider  cyclobenzaprine (FLEXERIL) 10 MG tablet Take 10 mg by mouth 3 (three) times daily as needed for muscle spasms.   Yes Historical Provider, MD  fluticasone (FLONASE) 50 MCG/ACT nasal spray Place 2 sprays into both nostrils daily. 08/20/13   [name], MD  HYDROcodone-acetaminophen (NORCO) 10-325 MG per tablet Take 1 tablet by mouth every 6 (six) hours as needed. 08/20/13   [name], MD   BP 95/61  Pulse 125  Temp(Src) 99.1 F (37.3 C) (Core (Comment))  Resp 28  SpO2 95% Physical Exam  Nursing note and vitals reviewed. Constitutional: He appears well-developed and well-nourished. No distress.  AGitated.   HENT:  Head: Normocephalic and atraumatic.  Eyes: Conjunctivae and EOM are normal.  Neck: Neck supple. No tracheal deviation present.  Cardiovascular: Normal rate and regular rhythm.   Tachycardic  Pulmonary/Chest: Effort normal and breath sounds normal. No respiratory distress. He has no wheezes. He has no rales.  Abdominal: Bowel sounds are normal. He exhibits no distension.  Flat  Musculoskeletal: Normal range of motion. He exhibits no edema.  Neurological: No cranial nerve deficit. He exhibits normal muscle tone. Coordination normal.  Skin: Skin is warm and dry. No rash noted.  Psychiatric: He has a normal mood and affect. His behavior is normal.    ED Course  Procedures (including critical care time)  DIAGNOSTIC STUDIES: Oxygen Saturation is 98% on RA, normal by my interpretation.    COORDINATION OF CARE:  10:08 PM  Will order labs and imaging.   Labs Review Labs Reviewed  COMPREHENSIVE METABOLIC PANEL - Abnormal; Notable for the following:    Potassium 3.2 (*)    Glucose, Bld 111 (*)    BUN 5 (*)    Total Bilirubin 0.2 (*)    Anion gap 16 (*)    All other components within normal limits  ETHANOL - Abnormal; Notable for the following:    Alcohol, Ethyl (B) 255 (*)    All other components within normal limits  URINE RAPID DRUG SCREEN  (HOSP PERFORMED) - Abnormal; Notable for the following:    Tetrahydrocannabinol POSITIVE (*)    All other components within normal limits  URINALYSIS, ROUTINE W REFLEX MICROSCOPIC  LIPASE, BLOOD  CBC WITH DIFFERENTIAL  ACETAMINOPHEN LEVEL  BLOOD GAS, ARTERIAL  SALICYLATE LEVEL   Results for orders placed during the hospital encounter of 08/27/13  URINALYSIS, ROUTINE W REFLEX MICROSCOPIC      Result Value Ref Range   Color, Urine YELLOW  YELLOW   APPearance CLEAR  CLEAR   Specific Gravity, Urine 1.010  1.005 - 1.030   pH 5.5  5.0 - 8.0   Glucose, UA NEGATIVE  NEGATIVE mg/dL   Hgb urine dipstick NEGATIVE  NEGATIVE   Bilirubin Urine NEGATIVE  NEGATIVE   Ketones, ur NEGATIVE  NEGATIVE mg/dL   Protein, ur NEGATIVE  NEGATIVE mg/dL   Urobilinogen, UA 0.2  0.0 - 1.0 mg/dL   Nitrite NEGATIVE  NEGATIVE   Leukocytes, UA NEGATIVE  NEGATIVE  COMPREHENSIVE METABOLIC PANEL      Result Value Ref Range   Sodium 147  137 - 147 mEq/L   Potassium 3.2 (*) 3.7 - 5.3 mEq/L   Chloride 111  96 - 112 mEq/L   CO2 20  19 - 32 mEq/L   Glucose, Bld 111 (*) 70 - 99 mg/dL   BUN 5 (*) 6 - 23 mg/dL   Creatinine, Ser 1.610.93  0.50 - 1.35 mg/dL   Calcium 8.5  8.4 - 09.610.5 mg/dL   Total Protein 6.7  6.0 - 8.3 g/dL   Albumin 4.0  3.5 - 5.2 g/dL   AST 13  0 - 37 U/L   ALT 13  0 - 53 U/L   Alkaline Phosphatase 88  39 - 117 U/L   Total Bilirubin 0.2 (*) 0.3 - 1.2 mg/dL   GFR calc non Af Amer >90  >90 mL/min   GFR calc Af Amer >90  >90 mL/min   Anion gap 16 (*) 5 - 15  LIPASE, BLOOD      Result Value Ref Range   Lipase 38  11 - 59 U/L  CBC WITH DIFFERENTIAL      Result Value Ref Range   WBC 6.7  4.0 - 10.5 K/uL   RBC 5.13  4.22 - 5.81 MIL/uL   Hemoglobin 14.8  13.0 - 17.0 g/dL   HCT 04.543.7  40.939.0 - 81.152.0 %   MCV 85.2  78.0 - 100.0 fL   MCH 28.8  26.0 - 34.0 pg   MCHC 33.9  30.0 - 36.0 g/dL   RDW 91.413.6  78.211.5 - 95.615.5 %   Platelets 161  150 - 400 K/uL   Neutrophils Relative % 74  43 - 77 %   Neutro Abs 4.9  1.7 -  7.7 K/uL   Lymphocytes Relative 16  12 - 46 %   Lymphs Abs 1.1  0.7 - 4.0 K/uL   Monocytes Relative 10  3 - 12 %  Monocytes Absolute 0.7  0.1 - 1.0 K/uL   Eosinophils Relative 0  0 - 5 %   Eosinophils Absolute 0.0  0.0 - 0.7 K/uL   Basophils Relative 0  0 - 1 %   Basophils Absolute 0.0  0.0 - 0.1 K/uL  ETHANOL      Result Value Ref Range   Alcohol, Ethyl (B) 255 (*) 0 - 11 mg/dL  URINE RAPID DRUG SCREEN (HOSP PERFORMED)      Result Value Ref Range   Opiates NONE DETECTED  NONE DETECTED   Cocaine NONE DETECTED  NONE DETECTED   Benzodiazepines NONE DETECTED  NONE DETECTED   Amphetamines NONE DETECTED  NONE DETECTED   Tetrahydrocannabinol POSITIVE (*) NONE DETECTED   Barbiturates NONE DETECTED  NONE DETECTED     Imaging Review Ct Head Wo Contrast  08/27/2013   CLINICAL DATA:  Altered mental status.  EXAM: CT HEAD WITHOUT CONTRAST  TECHNIQUE: Contiguous axial images were obtained from the base of the skull through the vertex without intravenous contrast.  COMPARISON:  CT of the head and cervical spine May 31, 2013  FINDINGS: Motion degraded examination, which required repeat imaging with mild image quality improvement.  The ventricles and sulci are normal. No intraparenchymal hemorrhage, mass effect nor midline shift. No acute large vascular territory infarcts.  No abnormal extra-axial fluid collections. Basal cisterns are patent.  No skull fracture. The included ocular globes and orbital contents are non-suspicious. Mild paranasal sinus mucosal thickening without air-fluid levels. The mastoid air cells are well aerated.  IMPRESSION: Motion degraded examination without acute intracranial process.   Electronically Signed   By: Awilda Metro   On: 08/27/2013 23:33   Dg Chest Port 1 View  08/27/2013   CLINICAL DATA:  Altered mental status. Possible overdose. History of smoking.  EXAM: PORTABLE CHEST - 1 VIEW  COMPARISON:  Chest radiograph performed 05/31/2013  FINDINGS: The lungs are  well-aerated. Mild vascular congestion is noted. There is no evidence of focal opacification, pleural effusion or pneumothorax.  The cardiomediastinal silhouette is within normal limits. No acute osseous abnormalities are seen.  IMPRESSION: Mild vascular congestion noted; lungs remain grossly clear.   Electronically Signed   By: Roanna Raider M.D.   On: 08/27/2013 22:52     EKG Interpretation   Date/Time:  Monday August 27 2013 21:52:20 EDT Ventricular Rate:  156 PR Interval:  127 QRS Duration: 102 QT Interval:  363 QTC Calculation: 585 R Axis:   89 Text Interpretation:  Sinus tachycardia LAE, consider biatrial enlargement  Minimal ST depression, inferior leads Prolonged QT interval Baseline  wander in lead(s) V3 No previous ECGs available Confirmed by Mehkai Gallo   MD, Jonn [phone]) on 08/27/2013 11:08:52 PM      CRITICAL CARE Performed by: Philip Thompson Total critical care time: 30 Critical care time was exclusive of separately billable procedures and treating other patients. Critical care was necessary to treat or prevent imminent or life-threatening deterioration. Critical care was time spent personally by me on the following activities: development of treatment plan with patient and/or surrogate as well as nursing, discussions with consultants, evaluation of patient's response to treatment, examination of patient, obtaining history from patient or surrogate, ordering and performing treatments and interventions, ordering and review of laboratory studies, ordering and review of radiographic studies, pulse oximetry and re-evaluation of patient's condition.    MDM   Final diagnoses:  Alcohol intoxication, with unspecified complication  Overdose, undetermined intent, sequela    Patient arrived very combative. Presumed  substance abuse. Probably not suicide attempt. It was very aggressive to family members and threatening them. Arrived here extremely agitated not mentating properly.  Patient had to be handcuffed to the bed. Patient was monitored IV was established patient was satting above 90% was tachycardic around 140. The a sinus tach on the monitor. Patient was given 20 mg of Geodon. Some sedative effect but not complete. Urine drug screen showed only THC in the system no cocaine. Alcohol was elevated to 55. Patient's EKG had no widening of the QRS. Electrolytes without any significant abnormalities other than mild hypokalemia. Ativan given patient became very chilled. Prior to giving Ativan, patient was starting to verbalize with profanity words. Overall was starting to show signs of improvement. Discussed with hospitalist will be admitted to step down unit. Arterial blood gas is pending for complete sake aspirin and Tylenol levels are pending. No evidence of a metabolic acidosis on electrolytes. No liver function test abnormalities. There was some question that maybe the patient had overdosed on Flexeril however the bottle for that was back in June it wasn't that many pills with salt and consume any pills at all. Police state the patient has done this in the past usually this is when he gets when he is using drugs.  I personally performed the services described in this documentation, which was scribed in my presence. The recorded information has been reviewed and is accurate.      Philip Mulders, MD 08/28/13 [id number]  Philip Mulders, MD 08/28/13 [phone]

## 2013-08-28 ENCOUNTER — Encounter (HOSPITAL_COMMUNITY): Payer: Self-pay

## 2013-08-28 DIAGNOSIS — T50901A Poisoning by unspecified drugs, medicaments and biological substances, accidental (unintentional), initial encounter: Secondary | ICD-10-CM | POA: Diagnosis present

## 2013-08-28 DIAGNOSIS — F1010 Alcohol abuse, uncomplicated: Secondary | ICD-10-CM | POA: Diagnosis not present

## 2013-08-28 DIAGNOSIS — E870 Hyperosmolality and hypernatremia: Secondary | ICD-10-CM | POA: Diagnosis present

## 2013-08-28 DIAGNOSIS — T40601A Poisoning by unspecified narcotics, accidental (unintentional), initial encounter: Secondary | ICD-10-CM

## 2013-08-28 DIAGNOSIS — E876 Hypokalemia: Secondary | ICD-10-CM | POA: Diagnosis present

## 2013-08-28 DIAGNOSIS — R4689 Other symptoms and signs involving appearance and behavior: Secondary | ICD-10-CM | POA: Diagnosis present

## 2013-08-28 DIAGNOSIS — F1210 Cannabis abuse, uncomplicated: Secondary | ICD-10-CM | POA: Diagnosis present

## 2013-08-28 DIAGNOSIS — F17200 Nicotine dependence, unspecified, uncomplicated: Secondary | ICD-10-CM | POA: Diagnosis present

## 2013-08-28 DIAGNOSIS — G9340 Encephalopathy, unspecified: Secondary | ICD-10-CM | POA: Diagnosis present

## 2013-08-28 DIAGNOSIS — F988 Other specified behavioral and emotional disorders with onset usually occurring in childhood and adolescence: Secondary | ICD-10-CM | POA: Diagnosis present

## 2013-08-28 DIAGNOSIS — F1994 Other psychoactive substance use, unspecified with psychoactive substance-induced mood disorder: Secondary | ICD-10-CM | POA: Diagnosis not present

## 2013-08-28 DIAGNOSIS — R4182 Altered mental status, unspecified: Secondary | ICD-10-CM | POA: Diagnosis present

## 2013-08-28 DIAGNOSIS — F1910 Other psychoactive substance abuse, uncomplicated: Secondary | ICD-10-CM | POA: Diagnosis not present

## 2013-08-28 DIAGNOSIS — R45851 Suicidal ideations: Secondary | ICD-10-CM

## 2013-08-28 DIAGNOSIS — F603 Borderline personality disorder: Secondary | ICD-10-CM | POA: Diagnosis present

## 2013-08-28 DIAGNOSIS — R45850 Homicidal ideations: Secondary | ICD-10-CM | POA: Diagnosis not present

## 2013-08-28 DIAGNOSIS — I10 Essential (primary) hypertension: Secondary | ICD-10-CM | POA: Diagnosis present

## 2013-08-28 DIAGNOSIS — F10929 Alcohol use, unspecified with intoxication, unspecified: Secondary | ICD-10-CM | POA: Diagnosis present

## 2013-08-28 DIAGNOSIS — Z781 Physical restraint status: Secondary | ICD-10-CM | POA: Diagnosis present

## 2013-08-28 DIAGNOSIS — T50902A Poisoning by unspecified drugs, medicaments and biological substances, intentional self-harm, initial encounter: Secondary | ICD-10-CM | POA: Diagnosis present

## 2013-08-28 LAB — BLOOD GAS, ARTERIAL
Acid-base deficit: 2.5 mmol/L — ABNORMAL HIGH (ref 0.0–2.0)
Bicarbonate: 22 mEq/L (ref 20.0–24.0)
DRAWN BY: 21310
O2 SAT: 96.2 %
PATIENT TEMPERATURE: 37
PCO2 ART: 38.8 mmHg (ref 35.0–45.0)
TCO2: 19.3 mmol/L (ref 0–100)
pH, Arterial: 7.371 (ref 7.350–7.450)
pO2, Arterial: 96.8 mmHg (ref 80.0–100.0)

## 2013-08-28 LAB — BASIC METABOLIC PANEL
Anion gap: 13 (ref 5–15)
BUN: 4 mg/dL — ABNORMAL LOW (ref 6–23)
CO2: 22 mEq/L (ref 19–32)
Calcium: 8.6 mg/dL (ref 8.4–10.5)
Chloride: 113 mEq/L — ABNORMAL HIGH (ref 96–112)
Creatinine, Ser: 0.82 mg/dL (ref 0.50–1.35)
GFR calc Af Amer: >90 mL/min (ref >90)
GFR calc non Af Amer: >90 mL/min (ref >90)
Glucose, Bld: 97 mg/dL (ref 70–99)
Potassium: 4 mEq/L (ref 3.7–5.3)
Sodium: 148 mEq/L — ABNORMAL HIGH (ref 137–147)

## 2013-08-28 LAB — MRSA PCR SCREENING: MRSA by PCR: POSITIVE — AB

## 2013-08-28 LAB — ACETAMINOPHEN LEVEL

## 2013-08-28 LAB — SALICYLATE LEVEL

## 2013-08-28 MED ORDER — SODIUM CHLORIDE 0.45 % IV SOLN
INTRAVENOUS | Status: DC
  Administered 2013-08-28 – 2013-08-30 (×5): via INTRAVENOUS

## 2013-08-28 MED ORDER — SODIUM CHLORIDE 0.9 % IV SOLN: INTRAVENOUS | Status: DC

## 2013-08-28 MED ORDER — SODIUM CHLORIDE 0.9 % IV SOLN
INTRAVENOUS | Status: DC
  Administered 2013-08-28: 02:00:00 via INTRAVENOUS

## 2013-08-28 MED ORDER — ONDANSETRON HCL 4 MG/2ML IJ SOLN
4.0000 mg | Freq: Once | INTRAMUSCULAR | Status: AC
  Administered 2013-08-28: 4 mg via INTRAVENOUS
  Filled 2013-08-28: qty 2

## 2013-08-28 MED ORDER — FOLIC ACID 1 MG PO TABS
1.0000 mg | ORAL_TABLET | Freq: Every day | ORAL | Status: DC
  Administered 2013-08-28 – 2013-08-30 (×3): 1 mg via ORAL
  Filled 2013-08-28 (×3): qty 1

## 2013-08-28 MED ORDER — VITAMIN B-1 100 MG PO TABS
100.0000 mg | ORAL_TABLET | Freq: Every day | ORAL | Status: DC
  Administered 2013-08-28 – 2013-08-30 (×3): 100 mg via ORAL
  Filled 2013-08-28 (×3): qty 1

## 2013-08-28 MED ORDER — ENOXAPARIN SODIUM 40 MG/0.4ML ~~LOC~~ SOLN
40.0000 mg | SUBCUTANEOUS | Status: DC
  Administered 2013-08-28 – 2013-08-30 (×3): 40 mg via SUBCUTANEOUS
  Filled 2013-08-28 (×3): qty 0.4

## 2013-08-28 MED ORDER — LORAZEPAM 2 MG/ML IJ SOLN
2.0000 mg | INTRAMUSCULAR | Status: DC | PRN
  Administered 2013-08-28: 3 mg via INTRAVENOUS
  Administered 2013-08-28: 2 mg via INTRAVENOUS
  Administered 2013-08-28: 1 mg via INTRAVENOUS
  Administered 2013-08-28 (×3): 2 mg via INTRAVENOUS
  Filled 2013-08-28: qty 2
  Filled 2013-08-28 (×5): qty 1
  Filled 2013-08-28: qty 2

## 2013-08-28 MED ORDER — MUPIROCIN 2 % EX OINT
1.0000 "application " | TOPICAL_OINTMENT | Freq: Two times a day (BID) | CUTANEOUS | Status: DC
  Administered 2013-08-28 – 2013-08-30 (×5): 1 via NASAL
  Filled 2013-08-28 (×2): qty 22

## 2013-08-28 MED ORDER — HALOPERIDOL LACTATE 5 MG/ML IJ SOLN
5.0000 mg | Freq: Four times a day (QID) | INTRAMUSCULAR | Status: DC | PRN
  Administered 2013-08-28: 5 mg via INTRAMUSCULAR
  Filled 2013-08-28: qty 1

## 2013-08-28 MED ORDER — DEXMEDETOMIDINE HCL IN NACL 200 MCG/50ML IV SOLN
0.2000 ug/kg/h | INTRAVENOUS | Status: AC
  Administered 2013-08-28: 0.2 ug/kg/h via INTRAVENOUS
  Administered 2013-08-28 – 2013-08-29 (×4): 0.5 ug/kg/h via INTRAVENOUS
  Filled 2013-08-28 (×5): qty 50

## 2013-08-28 MED ORDER — ADULT MULTIVITAMIN W/MINERALS CH
1.0000 | ORAL_TABLET | Freq: Every day | ORAL | Status: DC
  Administered 2013-08-28 – 2013-08-30 (×3): 1 via ORAL
  Filled 2013-08-28 (×3): qty 1

## 2013-08-28 MED ORDER — CHLORHEXIDINE GLUCONATE CLOTH 2 % EX PADS
6.0000 | MEDICATED_PAD | Freq: Every day | CUTANEOUS | Status: DC
  Administered 2013-08-28 – 2013-08-30 (×3): 6 via TOPICAL

## 2013-08-28 NOTE — ED Notes (Signed)
Per CT tech and security; pt was handcuffed when he went through CT; his hands were placed on his chest and stomach; abrasions noted to his stomach. No bleeding noted.

## 2013-08-28 NOTE — ED Notes (Signed)
Philip Thompson - mother: [id number]

## 2013-08-28 NOTE — ED Notes (Signed)
MD at bedside. 

## 2013-08-28 NOTE — ED Notes (Signed)
Pt resting. Nad noted at present. Occasional movement with verbal grunting.

## 2013-08-28 NOTE — H&P (Signed)
History and Physical    FOCH WEIDA UXY:[id number] DOB: 09-Feb-1990 DOA: 08/27/2013  Referring physician: Dr. Deretha Emory PCP: Lilyan Punt, MD  Specialists: psychiatry   Chief Complaint: AMS  HPI: Philip Thompson is a 23 y.o. male has a past medical history significant for polysubstance abuse, HTN, is being brought by the police after being called by family and neighbors for erratic behavior. Per report, patient has been expressing suicidal and homicidal thoughts and active threats towards family members and this is in the setting of substance abuse. EMS and the police was called and patient was brought to the ER. In the ER he continued to be very aggressive and needed to be cuffed to the bed as he was trying to bite and spit on ER staff, s/p Geodon with temporary improvement. Per report, his family has noticed that some medications from the flexeril bottle were missing but they are not sure how many. He is also on Norvasc and lasix. Patient does have a history of similar behavior in the past.  UDS positive for marijuana, tylenol and salicylate levels normal, ETOH level elevated.   Review of Systems: unable to obtain ROS to acute encephalopathy  Past Medical History  Diagnosis Date  . ADD (attention deficit disorder)   . Allergic rhinitis    History reviewed. No pertinent past surgical history. Social History:  reports that he has been smoking.  He does not have any smokeless tobacco history on file. He reports that he drinks alcohol. He reports that he uses illicit drugs.  No Known Allergies  No family history on file. unable to verify family history  Prior to Admission medications   Medication Sig Start Date End Date Taking? Authorizing Provider  cyclobenzaprine (FLEXERIL) 10 MG tablet Take 10 mg by mouth 3 (three) times daily as needed for muscle spasms.   Yes Historical Provider, MD  fluticasone (FLONASE) 50 MCG/ACT nasal spray Place 2 sprays into both nostrils daily. 08/20/13    [name], MD  HYDROcodone-acetaminophen (NORCO) 10-325 MG per tablet Take 1 tablet by mouth every 6 (six) hours as needed. 08/20/13   [name], MD   Physical Exam: Filed Vitals:   08/28/13 0005 08/28/13 0030 08/28/13 0100 08/28/13 0103  BP:  95/61 106/66   Pulse:  125 115   Temp: 99.1 F (37.3 C)   98 F (36.7 C)  TempSrc: Core (Comment)   Core (Comment)  Resp:  28 24   SpO2:  95% 94%     General:  Sleeping on my evaluation  Cardiovascular: regular rate without MRG  Respiratory: CTA biL on anterior auscultation, no wheezing  Abdomen: non tender to palpation, positive bowel sounds  Skin: no rashes  Musculoskeletal: no peripheral edema  Labs on Admission:  Basic Metabolic Panel:  Recent Labs Lab 08/27/13 2222  NA 147  K 3.2*  CL 111  CO2 20  GLUCOSE 111*  BUN 5*  CREATININE 0.93  CALCIUM 8.5   Liver Function Tests:  Recent Labs Lab 08/27/13 2222  AST 13  ALT 13  ALKPHOS 88  BILITOT 0.2*  PROT 6.7  ALBUMIN 4.0    Recent Labs Lab 08/27/13 2222  LIPASE 38   No results found for this basename: AMMONIA,  in the last 168 hours CBC:  Recent Labs Lab 08/27/13 2222  WBC 6.7  NEUTROABS 4.9  HGB 14.8  HCT 43.7  MCV 85.2  PLT 161   Cardiac Enzymes: No results found for this basename: CKTOTAL, CKMB,  CKMBINDEX, TROPONINI,  in the last 168 hours  BNP (last 3 results) No results found for this basename: PROBNP,  in the last 8760 hours CBG: No results found for this basename: GLUCAP,  in the last 168 hours  Radiological Exams on Admission: Ct Head Wo Contrast  08/27/2013   CLINICAL DATA:  Altered mental status.  EXAM: CT HEAD WITHOUT CONTRAST  TECHNIQUE: Contiguous axial images were obtained from the base of the skull through the vertex without intravenous contrast.  COMPARISON:  CT of the head and cervical spine May 31, 2013  FINDINGS: Motion degraded examination, which required repeat imaging with mild image quality improvement.  The  ventricles and sulci are normal. No intraparenchymal hemorrhage, mass effect nor midline shift. No acute large vascular territory infarcts.  No abnormal extra-axial fluid collections. Basal cisterns are patent.  No skull fracture. The included ocular globes and orbital contents are non-suspicious. Mild paranasal sinus mucosal thickening without air-fluid levels. The mastoid air cells are well aerated.  IMPRESSION: Motion degraded examination without acute intracranial process.   Electronically Signed   By: Awilda Metroourtnay  Bloomer   On: 08/27/2013 23:33   Dg Chest Port 1 View  08/27/2013   CLINICAL DATA:  Altered mental status. Possible overdose. History of smoking.  EXAM: PORTABLE CHEST - 1 VIEW  COMPARISON:  Chest radiograph performed 05/31/2013  FINDINGS: The lungs are well-aerated. Mild vascular congestion is noted. There is no evidence of focal opacification, pleural effusion or pneumothorax.  The cardiomediastinal silhouette is within normal limits. No acute osseous abnormalities are seen.  IMPRESSION: Mild vascular congestion noted; lungs remain grossly clear.   Electronically Signed   By: Roanna RaiderJeffery  Chang M.D.   On: 08/27/2013 22:52    EKG: Independently reviewed. Sinus tachycardia  Assessment/Plan Active Problems:   Overdose   Alcohol intoxication   Physically aggressive behavior   Severely aggressive behavior   Marijuana abuse   Suicidal ideation   Homicidal ideation   #1 Polysubstance abuse - marijuana, ETOH and possible drug overdose of prescription medication - monitor in SDU for now - ABG reassuring  #2 ETOH abuse - monitor on CIWA - patient still very agitated after high doses of Ativan, will use haldol PRN as well. Closely monitor as it may lower seizure threshold, but patient is high risk for harm to self and others.   #3 Aggressive behavious with HI and reported SI - psychiatry consult  Diet: NPO Fluids: none  DVT Prophylaxis: lovenox  Code Status: Full  Family  Communication: none  Disposition Plan: inpatient  Time spent: 50  This note has been created with Education officer, environmentalDragon speech recognition software and smart phrase technology. Any transcriptional errors are unintentional.   Costin M. Elvera LennoxGherghe, MD [facility] Pager [phone]  If 7PM-7AM, please contact night-coverage [email or web address] Password [email or web address] 08/28/2013, 1:25 AM

## 2013-08-28 NOTE — ED Notes (Signed)
Pt asleep. Nad noted at present. Sheriff's officer remains at bedside.

## 2013-08-28 NOTE — Progress Notes (Signed)
Chart reviewed and patient examined.  Gen: lethargic but easily aroused and combative when awake CV: tachycardic but regular, no m/g/r/ no LE edema Resp: normal effort BS distant clear no wheeze no rhonchi Abd: soft non-distended. Sluggish bowel sound MS: 4 point handcuffs in tact. Joint without swelling/erythema    #1 Polysubstance abuse - marijuana, ETOH and possible drug overdose of prescription medication. ABG reassuring. Continue sedation and restraints  #2 ETOH abuse - monitor on CIWA  - patient still very agitated after high doses of Ativan, will use haldol PRN as well. Closely monitor as it may lower seizure threshold, but patient is high risk for harm to self and others.   #3 Aggressive behavious with HI and reported SI  - psychiatry consult when awake and cooperative  #4. Hypernatremia: mild. Will adjust fluids. Monitor intake and output.     Philip Smothers NP

## 2013-08-28 NOTE — Progress Notes (Signed)
DEPUTY SHERRIFFS CHANGED SHIFTS @ THIS TIME. REDNESS OBSERVED AROUND BOTH ANKLES WHICH ARE CUFFED TO HOSPITAL BED. DEPUTY'S EXAMINED AREAS AND TOLD us THAT THESE WERE OLD INJURIES,NOT FROM CUFFS. HOWEVER CUFFS WERE LOOSENED.  BILATERAL WRIST CUFFS ALSO LOOSENED.Marland Kitchen CUFFS NOT REMOVED YET D/T PT OCCASIONALLY BECOMING AGITATED AND ATTEMPTING TO GET OOB IN ANGER.

## 2013-08-28 NOTE — Plan of Care (Signed)
Problem: Consults Goal: General Medical Patient Education See Patient Education Module for specific education.  Outcome: Not Applicable Date Met:  50/41/36 PT REMAINS W/ AMS D/T ALCOHOL AND DRUG INTOXICATION. UNABLE TO DO ANY EDUCATION, OR TO ARRANGE TELEPSYC  CONCULT.

## 2013-08-28 NOTE — Plan of Care (Signed)
Problem: Phase I Progression Outcomes Goal: Pain controlled with appropriate interventions Outcome: Progressing combative Goal: OOB as tolerated unless otherwise ordered Outcome: Not Applicable Date Met:  71/24/58 Bed rest in handcuffs Deputy in room Goal: Initial discharge plan identified Outcome: Not Met (add Reason) Unknown at time psych consult to be called per order Goal: Voiding-avoid urinary catheter unless indicated Outcome: Progressing Catheter in place/in use Goal: Hemodynamically stable Outcome: Progressing Stable at this time - hemodynamically

## 2013-08-28 NOTE — Progress Notes (Signed)
UR chart review completed.  

## 2013-08-28 NOTE — Progress Notes (Signed)
Pt seen and examined with Ms. Vedia Coffer. Briefly, pt presents with polysubstance abuse and ETOH intoxication. Pt still combative with police at bedside. Agree with Psych when pt is better able to interact.

## 2013-08-29 DIAGNOSIS — F1910 Other psychoactive substance abuse, uncomplicated: Secondary | ICD-10-CM

## 2013-08-29 DIAGNOSIS — F1994 Other psychoactive substance use, unspecified with psychoactive substance-induced mood disorder: Secondary | ICD-10-CM

## 2013-08-29 DIAGNOSIS — R45851 Suicidal ideations: Secondary | ICD-10-CM

## 2013-08-29 DIAGNOSIS — F603 Borderline personality disorder: Secondary | ICD-10-CM

## 2013-08-29 DIAGNOSIS — F10931 Alcohol use, unspecified with withdrawal delirium: Secondary | ICD-10-CM

## 2013-08-29 DIAGNOSIS — F10231 Alcohol dependence with withdrawal delirium: Secondary | ICD-10-CM

## 2013-08-29 DIAGNOSIS — F1210 Cannabis abuse, uncomplicated: Secondary | ICD-10-CM

## 2013-08-29 LAB — BASIC METABOLIC PANEL
Anion gap: 13 (ref 5–15)
BUN: 9 mg/dL (ref 6–23)
CO2: 23 mEq/L (ref 19–32)
CREATININE: 0.79 mg/dL (ref 0.50–1.35)
Calcium: 9.5 mg/dL (ref 8.4–10.5)
Chloride: 105 mEq/L (ref 96–112)
GFR calc non Af Amer: >90 mL/min (ref >90)
Glucose, Bld: 86 mg/dL (ref 70–99)
Potassium: 3.9 mEq/L (ref 3.7–5.3)
Sodium: 141 mEq/L (ref 137–147)

## 2013-08-29 LAB — CBC
HEMATOCRIT: 44 % (ref 39.0–52.0)
Hemoglobin: 14.7 g/dL (ref 13.0–17.0)
MCH: 28.9 pg (ref 26.0–34.0)
MCHC: 33.4 g/dL (ref 30.0–36.0)
MCV: 86.6 fL (ref 78.0–100.0)
Platelets: 166 10*3/uL (ref 150–400)
RBC: 5.08 MIL/uL (ref 4.22–5.81)
RDW: 13.7 % (ref 11.5–15.5)
WBC: 7.2 10*3/uL (ref 4.0–10.5)

## 2013-08-29 MED ORDER — NICOTINE 21 MG/24HR TD PT24
21.0000 mg | MEDICATED_PATCH | Freq: Every day | TRANSDERMAL | Status: DC
  Administered 2013-08-29 – 2013-08-30 (×2): 21 mg via TRANSDERMAL
  Filled 2013-08-29 (×2): qty 1

## 2013-08-29 MED ORDER — LORAZEPAM 2 MG/ML IJ SOLN
1.0000 mg | INTRAMUSCULAR | Status: DC | PRN
  Administered 2013-08-29: 1 mg via INTRAVENOUS
  Administered 2013-08-29: 2 mg via INTRAVENOUS
  Administered 2013-08-29: 1 mg via INTRAVENOUS
  Administered 2013-08-30 (×2): 2 mg via INTRAVENOUS
  Filled 2013-08-29 (×5): qty 1

## 2013-08-29 NOTE — Progress Notes (Signed)
[facility] PROGRESS NOTE  Coral SpikesScott L Lenk ZOX:[id number] DOB: [birth year] DOA: 08/27/2013 PCP: Lilyan PuntLUKING,Rainey, MD  Assessment/Plan: 1. Acute encephalopathy with combativeness and reported suicidal ideations. Patient was initially admitted with altered mental status, was extremely agitated and combative, required 4 point handcuff restraints. He was started on a Precedex infusion which markedly improved his symptoms. At this time, he is calm, cooperative, appears to be oriented and is able to answer questions appropriately. We'll attempt to wean off Precedex and use when necessary Ativan. It is likely that his encephalopathy was related to polysubstance abuse. His mother reports that [facility] has [facility] the past for behavioral issues. Apparently at that time, he also overdosed on medications. The patient says that he was diagnosed with bipolar disorder at some point in the past, but has not been on any medications. His mother is concerned that he may have overdosed on medications prior to this admission in an effort to harm himself. We'll request psychiatry evaluation to determine further disposition. 2. Polysubstance abuse. Patient reports using marijuana, alcohol, as well as hydrocodone. He denies using any other illicit drugs. 3. Alcohol abuse. Patient reports that he drinks every other day he has not experienced alcohol withdrawal in the past. At this time, he is still tachycardic and somewhat tremulous. He has been weaned off a Precedex. Will continue to use Ativan as needed. Mental status appears to be improving. 4. Hypernatremia. Mild. Likely related to decreased by mouth intake. Improved with IV fluid 5.   Code Status: Full code Family Communication: Discussed with patient and mother at the bedside  Disposition Plan: Pending psychiatry evaluation   Consultants:  Psychiatry pending  Procedures:    Antibiotics:    HPI/Subjective: Patient is restrained in  bed. Appears calm, cooperative.  Denies any complaints at present.  Is hungry and wants to eat. Does not have any recollection of events that lead to his hospitalization. He does describe himself as having "mood fluctuations" in the past. His mother reports that he has previously been admitted at her sides hospital for behavioral issues. He denies any current illicit drug use other than marijuana. He was questioned after his mother was asked to leave the room.  Objective: Filed Vitals:   08/29/13 1700  BP: 130/80  Pulse: 112  Temp:   Resp: 20    Intake/Output Summary (Last 24 hours) at 08/29/13 1738 Last data filed at 08/29/13 1700  Gross per 24 hour  Intake 2052.91 ml  Output   2690 ml  Net -637.09 ml   Filed Weights   08/28/13 0303 08/29/13 0436  Weight: 85.1 kg (187 lb 9.8 oz) 87.2 kg (192 lb 3.9 oz)    Exam:   General:  NAD, calm, cooperative  Cardiovascular: S1, S2 tachycardic  Respiratory: CTA B  Abdomen: soft, nt, nd, bs+  Musculoskeletal: no edema b/l   Data Reviewed: Basic Metabolic Panel:  Recent Labs Lab 08/27/13 2222 08/28/13 0403 08/29/13 0433  NA 147 148* 141  K 3.2* 4.0 3.9  CL 111 113* 105  CO2 20 22 23   GLUCOSE 111* 97 86  BUN 5* 4* 9  CREATININE 0.93 0.82 0.79  CALCIUM 8.5 8.6 9.5   Liver Function Tests:  Recent Labs Lab 08/27/13 2222  AST 13  ALT 13  ALKPHOS 88  BILITOT 0.2*  PROT 6.7  ALBUMIN 4.0    Recent Labs Lab 08/27/13 2222  LIPASE 38   No results found for this basename: AMMONIA,  in the  last 168 hours CBC:  Recent Labs Lab 08/27/13 2222 08/29/13 0433  WBC 6.7 7.2  NEUTROABS 4.9  --   HGB 14.8 14.7  HCT 43.7 44.0  MCV 85.2 86.6  PLT 161 166   Cardiac Enzymes: No results found for this basename: CKTOTAL, CKMB, CKMBINDEX, TROPONINI,  in the last 168 hours BNP (last 3 results) No results found for this basename: PROBNP,  in the last 8760 hours CBG: No results found for this basename: GLUCAP,  in the last  168 hours  Recent Results (from the past 240 hour(s))  MRSA PCR SCREENING     Status: Abnormal   Collection Time    08/28/13  1:45 AM      Result Value Ref Range Status   MRSA by PCR POSITIVE (*) NEGATIVE Final   Comment:            The GeneXpert MRSA Assay (FDA     approved for NASAL specimens     only), is one component of a     comprehensive MRSA colonization     surveillance program. It is not     intended to diagnose MRSA     infection nor to guide or     monitor treatment for     MRSA infections.     RESULT CALLED TO, READ BACK BY AND VERIFIED WITH:     Clent Ridges AT 0515 ON [id number] BY FORSYTH K     Studies: Ct Head Wo Contrast  08/27/2013   CLINICAL DATA:  Altered mental status.  EXAM: CT HEAD WITHOUT CONTRAST  TECHNIQUE: Contiguous axial images were obtained from the base of the skull through the vertex without intravenous contrast.  COMPARISON:  CT of the head and cervical spine May 31, 2013  FINDINGS: Motion degraded examination, which required repeat imaging with mild image quality improvement.  The ventricles and sulci are normal. No intraparenchymal hemorrhage, mass effect nor midline shift. No acute large vascular territory infarcts.  No abnormal extra-axial fluid collections. Basal cisterns are patent.  No skull fracture. The included ocular globes and orbital contents are non-suspicious. Mild paranasal sinus mucosal thickening without air-fluid levels. The mastoid air cells are well aerated.  IMPRESSION: Motion degraded examination without acute intracranial process.   Electronically Signed   By: Awilda Metro   On: 08/27/2013 23:33   Dg Chest Port 1 View  08/27/2013   CLINICAL DATA:  Altered mental status. Possible overdose. History of smoking.  EXAM: PORTABLE CHEST - 1 VIEW  COMPARISON:  Chest radiograph performed 05/31/2013  FINDINGS: The lungs are well-aerated. Mild vascular congestion is noted. There is no evidence of focal opacification, pleural effusion or  pneumothorax.  The cardiomediastinal silhouette is within normal limits. No acute osseous abnormalities are seen.  IMPRESSION: Mild vascular congestion noted; lungs remain grossly clear.   Electronically Signed   By: Roanna Raider M.D.   On: 08/27/2013 22:52    Scheduled Meds: . Chlorhexidine Gluconate Cloth  6 each Topical Q0600  . enoxaparin (LOVENOX) injection  40 mg Subcutaneous Q24H  . folic acid  1 mg Oral Daily  . multivitamin with minerals  1 tablet Oral Daily  . mupirocin ointment  1 application Nasal BID  . nicotine  21 mg Transdermal Daily  . thiamine  100 mg Oral Daily   Continuous Infusions: . sodium chloride 75 mL/hr at 08/29/13 0600    Active Problems:   Overdose   Alcohol intoxication   Physically aggressive behavior   Severely  aggressive behavior   Marijuana abuse   Suicidal ideation   Homicidal ideation    Time spent:    [facility]  [facility] Pager [phone]. If 7PM-7AM, please contact night-coverage at [email or web address], password [facility] 08/29/2013, 5:38 PM  LOS: 2 days

## 2013-08-29 NOTE — Progress Notes (Signed)
Patient rested throughout the shift.  Patient's mom called throughout the night to check on him.  Patient was arousable at various times.  Neuro checks done every two hours upon midnight to facilitate less interruptions during his rest last night.
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
## 2013-08-30 ENCOUNTER — Encounter: Payer: Self-pay | Admitting: Family Medicine

## 2013-08-30 DIAGNOSIS — M549 Dorsalgia, unspecified: Secondary | ICD-10-CM

## 2013-08-30 DIAGNOSIS — Y33XXXS Other specified events, undetermined intent, sequela: Secondary | ICD-10-CM

## 2013-08-30 DIAGNOSIS — T50901S Poisoning by unspecified drugs, medicaments and biological substances, accidental (unintentional), sequela: Secondary | ICD-10-CM

## 2013-08-30 DIAGNOSIS — G8929 Other chronic pain: Secondary | ICD-10-CM

## 2013-08-30 LAB — BASIC METABOLIC PANEL
Anion gap: 11 (ref 5–15)
BUN: 8 mg/dL (ref 6–23)
CALCIUM: 9.5 mg/dL (ref 8.4–10.5)
CO2: 27 mEq/L (ref 19–32)
CREATININE: 0.89 mg/dL (ref 0.50–1.35)
Chloride: 105 mEq/L (ref 96–112)
Glucose, Bld: 88 mg/dL (ref 70–99)
Potassium: 3.6 mEq/L — ABNORMAL LOW (ref 3.7–5.3)
Sodium: 143 mEq/L (ref 137–147)

## 2013-08-30 LAB — CBC
HCT: 44.5 % (ref 39.0–52.0)
Hemoglobin: 15.1 g/dL (ref 13.0–17.0)
MCH: 29.3 pg (ref 26.0–34.0)
MCHC: 33.9 g/dL (ref 30.0–36.0)
MCV: 86.2 fL (ref 78.0–100.0)
PLATELETS: 169 10*3/uL (ref 150–400)
RBC: 5.16 MIL/uL (ref 4.22–5.81)
RDW: 13.6 % (ref 11.5–15.5)
WBC: 5.9 10*3/uL (ref 4.0–10.5)

## 2013-08-30 LAB — MAGNESIUM: MAGNESIUM: 2 mg/dL (ref 1.5–2.5)

## 2013-08-30 MED ORDER — POTASSIUM CHLORIDE CRYS ER 20 MEQ PO TBCR
40.0000 meq | EXTENDED_RELEASE_TABLET | Freq: Once | ORAL | Status: AC
  Administered 2013-08-30: 40 meq via ORAL
  Filled 2013-08-30: qty 2

## 2013-08-30 MED ORDER — LORAZEPAM 2 MG/ML IJ SOLN
1.0000 mg | INTRAMUSCULAR | Status: DC | PRN
  Administered 2013-08-30: 1 mg via INTRAVENOUS
  Administered 2013-08-30: 2 mg via INTRAVENOUS
  Filled 2013-08-30 (×2): qty 1

## 2013-08-30 MED ORDER — DIPHENHYDRAMINE HCL 25 MG PO CAPS: 25.0000 mg | ORAL_CAPSULE | Freq: Every evening | ORAL | Status: DC | PRN

## 2013-08-30 NOTE — Progress Notes (Signed)
[facility] PROGRESS NOTE  Philip SpikesScott L Thompson ZOX:[id number] DOB: 17-Sep-1990 DOA: 08/27/2013 PCP: Philip PuntLUKING,Bobbi, MD  Assessment/Plan: 1. Acute encephalopathy with combativeness and reported suicidal ideations. Patient was initially admitted with altered mental status, was extremely agitated and combative, required 4 point handcuff restraints. He was started on a Precedex infusion which markedly improved his symptoms. At this time, he is calm, cooperative, appears to be oriented and is able to answer questions appropriately. Precedex has since weaned off and he has required intermittent Ativan. He was evaluated by psychiatry and felt that he did not make right ear for inpatient psychiatric admission. Recommendations were for outpatient psychiatry followup. 2. Polysubstance abuse. Patient reports using marijuana, alcohol, as well as hydrocodone. He denies using any other illicit drugs. We'll request social work input. 3. Alcohol abuse. Patient reports that he drinks every other day he has not experienced alcohol withdrawal in the past. At this time, he is still tachycardic. He has been weaned off a Precedex. It has required several doses of Ativan overnight. Would continue current treatments for now.. Mental status appears to be improving. 4. Hypernatremia. Mild. Likely related to decreased by mouth intake. Improved with IV fluid   Code Status: Full code Family Communication: Discussed with patient and mother at the bedside  Disposition Plan: Discharge home once improved, likely another 24 hours   Consultants:  Psychiatry  Procedures:    Antibiotics:    HPI/Subjective: Patient is significantly improved. Had some trouble sleeping overnight. Denies any other complaints  Objective: Filed Vitals:   08/30/13 1200  BP: 127/82  Pulse: 110  Temp: 97.7 F (36.5 C)  Resp: 21    Intake/Output Summary (Last 24 hours) at 08/30/13 2008 Last data filed at 08/30/13 1000  Gross per 24 hour   Intake   1950 ml  Output   2500 ml  Net   -550 ml   Filed Weights   08/28/13 0303 08/29/13 0436 08/30/13 0500  Weight: 85.1 kg (187 lb 9.8 oz) 87.2 kg (192 lb 3.9 oz) 84.414 kg (186 lb 1.6 oz)    Exam:   General:  NAD, calm, cooperative  Cardiovascular: S1, S2 tachycardic  Respiratory: CTA B  Abdomen: soft, nt, nd, bs+  Musculoskeletal: no edema b/l   Data Reviewed: Basic Metabolic Panel:  Recent Labs Lab 08/27/13 2222 08/28/13 0403 08/29/13 0433 08/30/13 0856  NA 147 148* 141 143  K 3.2* 4.0 3.9 3.6*  CL 111 113* 105 105  CO2 20 22 23 27   GLUCOSE 111* 97 86 88  BUN 5* 4* 9 8  CREATININE 0.93 0.82 0.79 0.89  CALCIUM 8.5 8.6 9.5 9.5  MG  --   --   --  2.0   Liver Function Tests:  Recent Labs Lab 08/27/13 2222  AST 13  ALT 13  ALKPHOS 88  BILITOT 0.2*  PROT 6.7  ALBUMIN 4.0    Recent Labs Lab 08/27/13 2222  LIPASE 38   No results found for this basename: AMMONIA,  in the last 168 hours CBC:  Recent Labs Lab 08/27/13 2222 08/29/13 0433 08/30/13 0856  WBC 6.7 7.2 5.9  NEUTROABS 4.9  --   --   HGB 14.8 14.7 15.1  HCT 43.7 44.0 44.5  MCV 85.2 86.6 86.2  PLT 161 166 169   Cardiac Enzymes: No results found for this basename: CKTOTAL, CKMB, CKMBINDEX, TROPONINI,  in the last 168 hours BNP (last 3 results) No results found for this basename: PROBNP,  in the last 8760 hours  CBG: No results found for this basename: GLUCAP,  in the last 168 hours  Recent Results (from the past 240 hour(s))  MRSA PCR SCREENING     Status: Abnormal   Collection Time    08/28/13  1:45 AM      Result Value Ref Range Status   MRSA by PCR POSITIVE (*) NEGATIVE Final   Comment:            The GeneXpert MRSA Assay (FDA     approved for NASAL specimens     only), is one component of a     comprehensive MRSA colonization     surveillance program. It is not     intended to diagnose MRSA     infection nor to guide or     monitor treatment for     MRSA  infections.     RESULT CALLED TO, READ BACK BY AND VERIFIED WITH:     WILSON T AT 0515 ON [id number] BY FORSYTH K     Studies: No results found.  Scheduled Meds:  Continuous Infusions:   Active Problems:   Overdose   Alcohol intoxication   Physically aggressive behavior   Severely aggressive behavior   Marijuana abuse   Suicidal ideation   Homicidal ideation    Time spent:    [facility]  [facility] Pager [phone]. If 7PM-7AM, please contact night-coverage at [email or web address], password [facility] 08/30/2013, 8:08 PM  LOS: 3 days

## 2013-08-30 NOTE — Discharge Summary (Signed)
Physician Discharge Summary  LOVELL NUTTALL KGM:[id number] DOB: 02-28-1990 DOA: 08/27/2013  PCP: Lilyan Punt, MD  Admit date: 08/27/2013 Discharge date: 08/30/2013  Time spent: 25 minutes  Recommendations for Outpatient Follow-up:  1. PATIENT LEFT THE HOSPITAL AGAINST MEDICAL ADVICE 2. It was recommended that he followup with psychiatry as an outpatient  Discharge Diagnoses:  Active Problems:   Overdose   Alcohol intoxication   Physically aggressive behavior   Severely aggressive behavior   Marijuana abuse   Suicidal ideation   Homicidal ideation    Filed Weights   08/28/13 0303 08/29/13 0436 08/30/13 0500  Weight: 85.1 kg (187 lb 9.8 oz) 87.2 kg (192 lb 3.9 oz) 84.414 kg (186 lb 1.6 oz)    History of present illness and hospital course:  This patient was admitted to the hospital with altered mental status after he had taken an unknown amount of pills as well as alcohol. The patient was combative. His family felt that he was likely suicidal which is white and overdose of medications. He will require a Precedex infusion to help control his agitation. As his clinical condition condition improved his mentation also improved. He became calm, cooperative and was able to provide an appropriate history. He was reported that he did have a prior history of overdose in the past was admitted at [facility]. There was questionable history of bipolar, but he was not placed on any treatment. He was seen by psychiatry who felt that he did not need inpatient psychiatry criteria and recommended outpatient followup. The patient continues to be tachycardic and required significant doses of Ativan. It was recommended that he continue an inpatient stay until his tachycardia had further improved. The patient was adamant about discharge and insisted on leaving the hospital. He was explained the risks of leaving the hospital AGAINST MEDICAL ADVICE including development of worsening tachycardia, seizures  and life-threatening conditions. The patient acknowledged these risks and still wishes to leave the hospital AGAINST MEDICAL ADVICE.  Consultations:  Psychiatry  Discharge Exam: Filed Vitals:   08/30/13 1200  BP: 127/82  Pulse: 110  Temp: 97.7 F (36.5 C)  Resp: 21    General: Awake, alert, cooperative, no distress Cardiovascular: S1, S2, tachycardic Respiratory: Clear to auscultation bilaterally  Discharge Instructions You were cared for by a hospitalist during your hospital stay. If you have any questions about your discharge medications or the care you received while you were in the hospital after you are discharged, you can call the unit and asked to speak with the hospitalist on call if the hospitalist that took care of you is not available. Once you are discharged, your primary care physician will handle any further medical issues. Please note that NO REFILLS for any discharge medications will be authorized once you are discharged, as it is imperative that you return to your primary care physician (or establish a relationship with a primary care physician if you do not have one) for your aftercare needs so that they can reassess your need for medications and monitor your lab values.     Medication List    ASK your doctor about these medications       cyclobenzaprine 10 MG tablet  Commonly known as:  FLEXERIL  Take 10 mg by mouth every 8 (eight) hours as needed for muscle spasms.     HYDROcodone-acetaminophen 10-325 MG per tablet  Commonly known as:  NORCO  Take 1 tablet by mouth every 6 (six) hours as needed. pain  No Known Allergies    The results of significant diagnostics from this hospitalization (including imaging, microbiology, ancillary and laboratory) are listed below for reference.    Significant Diagnostic Studies: Ct Head Wo Contrast  08/27/2013   CLINICAL DATA:  Altered mental status.  EXAM: CT HEAD WITHOUT CONTRAST  TECHNIQUE: Contiguous axial  images were obtained from the base of the skull through the vertex without intravenous contrast.  COMPARISON:  CT of the head and cervical spine May 31, 2013  FINDINGS: Motion degraded examination, which required repeat imaging with mild image quality improvement.  The ventricles and sulci are normal. No intraparenchymal hemorrhage, mass effect nor midline shift. No acute large vascular territory infarcts.  No abnormal extra-axial fluid collections. Basal cisterns are patent.  No skull fracture. The included ocular globes and orbital contents are non-suspicious. Mild paranasal sinus mucosal thickening without air-fluid levels. The mastoid air cells are well aerated.  IMPRESSION: Motion degraded examination without acute intracranial process.   Electronically Signed   By: Awilda Metroourtnay  Bloomer   On: 08/27/2013 23:33   Dg Chest Port 1 View  08/27/2013   CLINICAL DATA:  Altered mental status. Possible overdose. History of smoking.  EXAM: PORTABLE CHEST - 1 VIEW  COMPARISON:  Chest radiograph performed 05/31/2013  FINDINGS: The lungs are well-aerated. Mild vascular congestion is noted. There is no evidence of focal opacification, pleural effusion or pneumothorax.  The cardiomediastinal silhouette is within normal limits. No acute osseous abnormalities are seen.  IMPRESSION: Mild vascular congestion noted; lungs remain grossly clear.   Electronically Signed   By: Roanna RaiderJeffery  Chang M.D.   On: 08/27/2013 22:52    Microbiology: Recent Results (from the past 240 hour(s))  MRSA PCR SCREENING     Status: Abnormal   Collection Time    08/28/13  1:45 AM      Result Value Ref Range Status   MRSA by PCR POSITIVE (*) NEGATIVE Final   Comment:            The GeneXpert MRSA Assay (FDA     approved for NASAL specimens     only), is one component of a     comprehensive MRSA colonization     surveillance program. It is not     intended to diagnose MRSA     infection nor to guide or     monitor treatment for     MRSA  infections.     RESULT CALLED TO, READ BACK BY AND VERIFIED WITH:     WILSON T AT 0515 ON [id number] BY FORSYTH K     Labs: Basic Metabolic Panel:  Recent Labs Lab 08/27/13 2222 08/28/13 0403 08/29/13 0433 08/30/13 0856  NA 147 148* 141 143  K 3.2* 4.0 3.9 3.6*  CL 111 113* 105 105  CO2 20 22 23 27   GLUCOSE 111* 97 86 88  BUN 5* 4* 9 8  CREATININE 0.93 0.82 0.79 0.89  CALCIUM 8.5 8.6 9.5 9.5  MG  --   --   --  2.0   Liver Function Tests:  Recent Labs Lab 08/27/13 2222  AST 13  ALT 13  ALKPHOS 88  BILITOT 0.2*  PROT 6.7  ALBUMIN 4.0    Recent Labs Lab 08/27/13 2222  LIPASE 38   No results found for this basename: AMMONIA,  in the last 168 hours CBC:  Recent Labs Lab 08/27/13 2222 08/29/13 0433 08/30/13 0856  WBC 6.7 7.2 5.9  NEUTROABS 4.9  --   --  HGB 14.8 14.7 15.1  HCT 43.7 44.0 44.5  MCV 85.2 86.6 86.2  PLT 161 166 169   Cardiac Enzymes: No results found for this basename: CKTOTAL, CKMB, CKMBINDEX, TROPONINI,  in the last 168 hours BNP: BNP (last 3 results) No results found for this basename: PROBNP,  in the last 8760 hours CBG: No results found for this basename: GLUCAP,  in the last 168 hours     Signed:  MEMON,JEHANZEB  [facility] 08/30/2013, 8:14 PM

## 2013-08-30 NOTE — Progress Notes (Addendum)
Patient's heart rate has been running in the 130's to 145's last night with no arrhythmia's.  Talk to the on-call MD, Dr. Craige Cotta who suggested we give him the Ativan with the CIWA protocol to decrease his heart rate.  It has still sustained between 115's and 125's throughout the night even after three doses of Ativan.

## 2013-08-30 NOTE — Consult Note (Signed)
Case discussed, agree with plan 

## 2013-09-21 ENCOUNTER — Emergency Department (HOSPITAL_COMMUNITY)
Admission: EM | Admit: 2013-09-21 | Discharge: 2013-09-22 | Disposition: A | Discharge status: Home / Self Care | Payer: BC Managed Care – PPO | Attending: Emergency Medicine | Admitting: Emergency Medicine

## 2013-09-21 ENCOUNTER — Encounter (HOSPITAL_COMMUNITY): Payer: Self-pay | Admitting: Emergency Medicine

## 2013-09-21 DIAGNOSIS — Z008 Encounter for other general examination: Secondary | ICD-10-CM | POA: Diagnosis not present

## 2013-09-21 DIAGNOSIS — Z046 Encounter for general psychiatric examination, requested by authority: Secondary | ICD-10-CM | POA: Diagnosis present

## 2013-09-21 DIAGNOSIS — Z0000 Encounter for general adult medical examination without abnormal findings: Secondary | ICD-10-CM

## 2013-09-21 DIAGNOSIS — Z8709 Personal history of other diseases of the respiratory system: Secondary | ICD-10-CM | POA: Diagnosis not present

## 2013-09-21 DIAGNOSIS — F17200 Nicotine dependence, unspecified, uncomplicated: Secondary | ICD-10-CM | POA: Diagnosis not present

## 2013-09-21 DIAGNOSIS — Z8659 Personal history of other mental and behavioral disorders: Secondary | ICD-10-CM | POA: Diagnosis not present

## 2013-09-21 LAB — BASIC METABOLIC PANEL
Anion gap: 10 (ref 5–15)
BUN: 11 mg/dL (ref 6–23)
CHLORIDE: 102 meq/L (ref 96–112)
CO2: 27 meq/L (ref 19–32)
CREATININE: 0.97 mg/dL (ref 0.50–1.35)
Calcium: 9.3 mg/dL (ref 8.4–10.5)
GFR calc Af Amer: >90 mL/min (ref >90)
GFR calc non Af Amer: >90 mL/min (ref >90)
Glucose, Bld: 95 mg/dL (ref 70–99)
Potassium: 4 mEq/L (ref 3.7–5.3)
Sodium: 139 mEq/L (ref 137–147)

## 2013-09-21 LAB — CBC WITH DIFFERENTIAL/PLATELET
BASOS ABS: 0 10*3/uL (ref 0.0–0.1)
BASOS PCT: 0 % (ref 0–1)
Eosinophils Absolute: 0.1 10*3/uL (ref 0.0–0.7)
Eosinophils Relative: 2 % (ref 0–5)
HCT: 40.5 % (ref 39.0–52.0)
Hemoglobin: 13.8 g/dL (ref 13.0–17.0)
Lymphocytes Relative: 26 % (ref 12–46)
Lymphs Abs: 2.1 10*3/uL (ref 0.7–4.0)
MCH: 29.3 pg (ref 26.0–34.0)
MCHC: 34.1 g/dL (ref 30.0–36.0)
MCV: 86 fL (ref 78.0–100.0)
MONO ABS: 0.7 10*3/uL (ref 0.1–1.0)
Monocytes Relative: 9 % (ref 3–12)
NEUTROS ABS: 5.1 10*3/uL (ref 1.7–7.7)
Neutrophils Relative %: 63 % (ref 43–77)
PLATELETS: 169 10*3/uL (ref 150–400)
RBC: 4.71 MIL/uL (ref 4.22–5.81)
RDW: 13.2 % (ref 11.5–15.5)
WBC: 8.1 10*3/uL (ref 4.0–10.5)

## 2013-09-21 LAB — ETHANOL: Alcohol, Ethyl (B): <11 mg/dL (ref 0–11)

## 2013-09-21 NOTE — ED Notes (Signed)
Pt states he has not used drugs in 3 weeks

## 2013-09-21 NOTE — BH Assessment (Addendum)
Per Dr. Preston Fleeting Pt is under IVC by mother due to saying he is going to kill himself. Pt denies SI reports occasional drinking, that sts he uses marijuana but denies use in past three weeks. Dr. Preston Fleeting is looking for more information to determine if he will reverse the IVC.  Requested TA equipment be set up for assessment and that IVC paperwork be faxed to [phone]. [facility] staff will call [facility] cart when ready to do assessment.   TA to commence shortly.   Clista Bernhardt, [facility] Triage Specialist 09/22/2013 12:00 AM

## 2013-09-21 NOTE — ED Provider Notes (Signed)
CSN: [id number]     Arrival date & time 09/21/13  2206 History   First MD Initiated Contact with Patient 09/21/13 2330     Chief Complaint  Patient presents with  . V70.1     (Consider location/radiation/quality/duration/timing/severity/associated sxs/prior Treatment) The history is provided by the patient.   23 year old male is brought in by police because his mother felt Treatment papers on him. IVC papers state that he has been depressed and talking of killing himself and stated that he would try to get into an altercation with police to have them shoot him. Patient states that he is not depressed and has never had any suicidal thoughts and that he and his mother have been arguing for the last 2 days and he thinks that his mother was just trying to teach them a lesson. He denies crying spells, or early morning wakening, or anhedonia. He does have a history of drug use but states that the only drug use uses marijuana and is not used to the last 3 weeks. He does smoke one pack of cigarettes a day and drinks occasionally. He denies prior psychiatric history.  Past Medical History  Diagnosis Date  . ADD (attention deficit disorder)   . Allergic rhinitis    History reviewed. No pertinent past surgical history. No family history on file. History  Substance Use Topics  . Smoking status: Current Every Day Smoker -- 1.00 packs/day  . Smokeless tobacco: Not on file  . Alcohol Use: Yes     Comment: occasionaly    Review of Systems  All other systems reviewed and are negative.     Allergies  Review of patient's allergies indicates no known allergies.  Home Medications   Prior to Admission medications   Medication Sig Start Date End Date Taking? Authorizing Provider  cyclobenzaprine (FLEXERIL) 10 MG tablet Take 10 mg by mouth every 8 (eight) hours as needed for muscle spasms.   Yes Historical Provider, MD  HYDROcodone-acetaminophen (NORCO) 10-325 MG per tablet Take 1 tablet by mouth  every 6 (six) hours as needed. pain   Yes Historical Provider, MD   BP 143/95  Pulse 109  Temp(Src) 98.2 F (36.8 C) (Oral)  Resp 16  Ht 5\' 9"  (1.753 m)  Wt 175 lb (79.379 kg)  BMI 25.83 kg/m2  SpO2 100% Physical Exam  Nursing note and vitals reviewed.  22 year old male, resting comfortably and in no acute distress. Vital signs are significant for borderline hypertension, and tachycardia. Oxygen saturation is 100%, which is normal. Head is normocephalic and atraumatic. PERRLA, EOMI. Oropharynx is clear. Neck is nontender and supple without adenopathy or JVD. Back is nontender and there is no CVA tenderness. Lungs are clear without rales, wheezes, or rhonchi. Chest is nontender. Heart has regular rate and rhythm without murmur. Abdomen is soft, flat, nontender without masses or hepatosplenomegaly and peristalsis is normoactive. Extremities have no cyanosis or edema, full range of motion is present. Skin is warm and dry without rash. Neurologic: Mental status is normal, cranial nerves are intact, there are no motor or sensory deficits. Psychiatric: He has a normal affect and makes good eye contact and speaks with normal infections. He does not appear depressed in any way.  ED Course  Procedures (including critical care time) Labs Review Results for orders placed during the hospital encounter of 09/21/13  CBC WITH DIFFERENTIAL      Result Value Ref Range   WBC 8.1  4.0 - 10.5 K/uL   RBC 4.71  4.22 - 5.81 MIL/uL   Hemoglobin 13.8  13.0 - 17.0 g/dL   HCT 16.1  09.6 - 04.5 %   MCV 86.0  78.0 - 100.0 fL   MCH 29.3  26.0 - 34.0 pg   MCHC 34.1  30.0 - 36.0 g/dL   RDW 40.9  81.1 - 91.4 %   Platelets 169  150 - 400 K/uL   Neutrophils Relative % 63  43 - 77 %   Neutro Abs 5.1  1.7 - 7.7 K/uL   Lymphocytes Relative 26  12 - 46 %   Lymphs Abs 2.1  0.7 - 4.0 K/uL   Monocytes Relative 9  3 - 12 %   Monocytes Absolute 0.7  0.1 - 1.0 K/uL   Eosinophils Relative 2  0 - 5 %   Eosinophils  Absolute 0.1  0.0 - 0.7 K/uL   Basophils Relative 0  0 - 1 %   Basophils Absolute 0.0  0.0 - 0.1 K/uL  ETHANOL      Result Value Ref Range   Alcohol, Ethyl (B) <11  0 - 11 mg/dL  URINE RAPID DRUG SCREEN (HOSP PERFORMED)      Result Value Ref Range   Opiates POSITIVE (*) NONE DETECTED   Cocaine NONE DETECTED  NONE DETECTED   Benzodiazepines NONE DETECTED  NONE DETECTED   Amphetamines NONE DETECTED  NONE DETECTED   Tetrahydrocannabinol POSITIVE (*) NONE DETECTED   Barbiturates NONE DETECTED  NONE DETECTED  BASIC METABOLIC PANEL      Result Value Ref Range   Sodium 139  137 - 147 mEq/L   Potassium 4.0  3.7 - 5.3 mEq/L   Chloride 102  96 - 112 mEq/L   CO2 27  19 - 32 mEq/L   Glucose, Bld 95  70 - 99 mg/dL   BUN 11  6 - 23 mg/dL   Creatinine, Ser 7.82  0.50 - 1.35 mg/dL   Calcium 9.3  8.4 - 95.6 mg/dL   GFR calc non Af Amer >90  >90 mL/min   GFR calc Af Amer >90  >90 mL/min   Anion gap 10  5 - 15   MDM   Final diagnoses:  Normal physical exam    Alleged suicidal ideation. All records are reviewed and he does have some visits at [facility] in 2011 but I cannot access those records. Based on my exam, I do not feel he is suicidal but will ask for a psychiatric consultation for confirmation.  TTS consultation is appreciated. They are in agreement that he does not meet criteria for involuntary commitment. He is discharged and is given referral to [facility].    Dione Booze, MD 09/22/13 [phone]

## 2013-09-21 NOTE — ED Notes (Signed)
Pt arrives with officer, Mother took out IVC, Per forms mother states pt threatens to kill himself everyday, pt denies.  Pt denies any drug use today.

## 2013-09-22 LAB — RAPID URINE DRUG SCREEN, HOSP PERFORMED
Amphetamines: NOT DETECTED
BARBITURATES: NOT DETECTED
Benzodiazepines: NOT DETECTED
Cocaine: NOT DETECTED
Opiates: POSITIVE — AB
Tetrahydrocannabinol: POSITIVE — AB

## 2013-09-22 NOTE — BH Assessment (Signed)
Per mother [name], "Honestly, I am extremely concerned for Philip Thompson." Reports daily that he wants to die, and threatens to take his life. "It really scares me." Mom worries that he will follow through because he has overdosed twice.  Per mom pt sts he is hopeless, constantly tells mom within a year he will dead. "I had to at least try to get him some kind of professional help." Per mom has threatened to take pills, and that if he had a gun he would kill himself.   He has overdosed twice. First cocaine, and xanax with other pills taken to [facility] about three months ago. Per mom may have been accidental.  A couple of weeks ago at [facility] OD but is was never discovered what medication was taken. Mom reports this was done on purpose.   Mom reports at [facility] pt was diagnosed with depression and bipolar a couple of years ago. Then he started going to [facility] in [place], and then quit about 4 months ago. Per mom this treatment seemed to help but pt just stopped going.   "[name] tends to let things bother him more than the usual person." Police office across the street posted some things about pt on Facebook that pt was a hell-raiser, and loud in the yard. Other neighbors jumped in and made comments which upset [name]. Pt became upset and wanted to kill himself. This happened about one month ago. Yesterday office called in pt for driving without license, and pt heard it on scanner.    Mom reports pt is prescribed pain medication, and drinks vodka heavily and seems more likely to hurt himself when he is drinking. "I love my kid and I don't want to hurt him in anyway possible." "Going to take out papers was the hardest thing I've ever done." She sts she wishes he could be forced to do OP counseling as it was helpful to him.   Clista Bernhardt, [facility] Triage Specialist 09/22/2013 12:50 AM

## 2013-09-22 NOTE — BH Assessment (Addendum)
Relayed results of assessment to Maryjean Morn, PA. Per Leonette Most, [place] pt does not meet inpt criteria. Pt can be discharged with OP referrals.   Spoke with Dr. Preston Fleeting, EDP who is in agreement with discharging pt with OP referrals if he would like them. Dr. Preston Fleeting reports they have referral sheets to give pt.  Clista Bernhardt, [facility] Triage Specialist 09/22/2013 1:15 AM

## 2013-09-22 NOTE — BH Assessment (Signed)
TA Completed.  Called mom [name] who filed for the IVC. No answer. Left a voicemail requesting a call back. Will attempt to call back if no response in 20 minutes.   Awaiting results of UDS.   Clista Bernhardt, [facility] Triage Specialist 09/22/2013 12:31 AM

## 2013-09-22 NOTE — BH Assessment (Signed)
Tele Assessment Note   Philip Thompson is an 23 y.o. male. Brought in under IVC filed by mother. Information was provided by pt during tele assessment and by mother Philip Thompson via phone call with pt permission. Pt is alert and oriented times 4. Pt denies SI, HI, a/v hallucinations, and self-harm. Pt sts he has been drinking 2-3 weekends a months, 4 -4 beers for the past couple of years. Pt sts he uses marijuana a couple of hits about once a month, socially for the past few years. Pt denies other drug use. Pt denies history of mental health problems or SA treatment. Pt sts he went to counselor a few times four years ago following the unexpected death of his boss. Pt sts he believes his mother filed paper work to teach him a Administrator, Civil Service as they have been arguing the past couple of days. Pt reports before he was served the argument had blown over and he feels safe to return home. Mom reports he is able to return home when discharged.   When pt was seen on 08-27-13 he was discharged to follow up with [facility]. He was dx with substance induced mood disorder.  PER IVC: Respondent has been hospitalized twice within the last 4 months for overdoses. The last time was approximately one months ago. Everyday he talks about killing himself he says that his life is hopeless, what does he have to live for. He talks about suicide by Technical sales engineer. Make them think he has a gun and is going to shoot them so they will shoot him. His mother states he definitely doesn't have a gun. 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 His mother states they have to hide all medication in the home.   Per Mother Philip Thompson:  Pt has had two overdoses in the past two months, and has been drinking vodka heavily. Mom reports pt often makes comments about his life being hopeless and wanting to die. Mom sts he has told her he would not be alive in a year. Mom is concerned for his safety and would like him to return to OP  counseling. Mom did not describe pt as an immanent threat to himself and others.   Axis I: Substance Induced Mood Disorder, per history Axis II: Deferred Axis III:  Past Medical History  Diagnosis Date  . ADD (attention deficit disorder)   . Allergic rhinitis    Axis IV: none Axis V: 51-60 moderate symptoms  Past Medical History:  Past Medical History  Diagnosis Date  . ADD (attention deficit disorder)   . Allergic rhinitis     History reviewed. No pertinent past surgical history.  Family History: No family history on file.  Social History:  reports that he has been smoking.  He does not have any smokeless tobacco history on file. He reports that he drinks alcohol. He reports that he uses illicit drugs.  Additional Social History:  Alcohol / Drug Use Pain Medications: has been taking pain medication for about 1 year, due to back pain See MAR Prescriptions: See MAR Over the Counter: Ibprofen as needed per pt See MAR History of alcohol / drug use?: Yes (Pt denies history of substance abuse. He reports drinking 4-5 beers 2-3 weekends a month for the past couple of years. Pt reports he uses marijuana socially taking a few hits when he is around others who are using, the last use being 2-3 weeks ago. ) Longest period of  sobriety (when/how long): weeks  Negative Consequences of Use: Legal;Personal relationships (Possession of marijuana charge 2 years ago) Withdrawal Symptoms:  (denies) Substance #1 Name of Substance 1: alcohol  1 - Age of First Use: 17 1 - Amount (size/oz): 4-5 beers 1 - Frequency: 2-3 weekends a month 1 - Duration: "couple of years" 1 - Last Use / Amount: last Saturday 4- 5 beers Substance #2 Name of Substance 2: marijuana 2 - Age of First Use: [age] 2 - Amount (size/oz): couple of hits 2 - Frequency: about once per month 2 - Duration: 3 years 2 - Last Use / Amount: 2-3 weeks ago, a couple of hits, reports only uses socially with others Substance #3 Name  of Substance 3: cocaine and xanax 3 - Age of First Use: denies 3 - Amount (size/oz): denies 3 - Frequency: denies 3 - Duration: denies 3 - Last Use / Amount: Mom reports pt overdoses on cocaine and Xanax 3 months ago and was admitted to [facility]  CIWA: CIWA-Ar BP: 143/95 mmHg Pulse Rate: 109 COWS:    PATIENT STRENGTHS: (choose at least two) Special hobby/interest Supportive family/friends Work skills  Allergies: No Known Allergies  Home Medications:  (Not in a hospital admission)  OB/GYN Status:  No LMP for male patient.  General Assessment Data Location of Assessment: [facility] Is this a Tele or Face-to-Face Assessment?: Tele Assessment Is this an Initial Assessment or a Re-assessment for this encounter?: Initial Assessment Living Arrangements: Parent (mother and father) Can pt return to current living arrangement?: Yes Admission Status: Involuntary Is patient capable of signing voluntary admission?: No Transfer from: Home Referral Source:  (mother)     [facility] Crisis Care Plan Living Arrangements: Parent (mother and father) Name of Psychiatrist: none Name of Therapist: none  Education Status Is patient currently in school?: No Current Grade: none Highest grade of school patient has completed: 42 Name of school: na Contact person: na  Risk to self with the past 6 months Suicidal Ideation: No Suicidal Intent: No Is patient at risk for suicide?: No Suicidal Plan?: No Access to Means: No What has been your use of drugs/alcohol within the last 12 months?: Pt reports using alcohol and marijuana. Alcohol 4-5 beers 2-3 times per months and marijuana about once monthly. He is prescribed hydrocodone for pain and reports he takes it as prescribed Previous Attempts/Gestures: No How many times?: 0 (mom reports he has overdosed twice, in past 3 months) Other Self Harm Risks: none Triggers for Past Attempts: None known Intentional Self Injurious Behavior:  None Family Suicide History: No Recent stressful life event(s): Conflict (Comment) (reports recent arguments with mother) Persecutory voices/beliefs?: No Depression: No Depression Symptoms:  (denies) Substance abuse history and/or treatment for substance abuse?: No Suicide prevention information given to non-admitted patients: Yes  Risk to Others within the past 6 months Thoughts of Harm to Others: No Current Homicidal Intent: No Current Homicidal Plan: No Access to Homicidal Means: No Identified Victim: none History of harm to others?: No Assessment of Violence: None Noted Violent Behavior Description: none Does patient have access to weapons?: No Criminal Charges Pending?: No Does patient have a court date: No  Psychosis Hallucinations: None noted Delusions: None noted  Mental Status Report Appear/Hygiene: Unremarkable Eye Contact: Good Motor Activity: Unremarkable Speech: Logical/coherent Level of Consciousness: Alert Mood:  (neutral) Affect:  (consistent with mood) Anxiety Level: None Thought Processes: Coherent;Relevant Judgement: Unimpaired Orientation: Person;Place;Time;Situation Obsessive Compulsive Thoughts/Behaviors: None  Cognitive Functioning Concentration: Normal Memory: Recent Intact;Remote  Intact IQ: Average Insight: Fair Impulse Control: Fair Appetite: Good Weight Loss: 0 Weight Gain: 0 Sleep: No Change Total Hours of Sleep: 8 Vegetative Symptoms: None  ADLScreening [facility] Assessment Services) Patient's cognitive ability adequate to safely complete daily activities?: Yes Patient able to express need for assistance with ADLs?: Yes Independently performs ADLs?: Yes (appropriate for developmental age)  Prior Inpatient Therapy Prior Inpatient Therapy:  (denies, mom reports [place] 3 months ago) Prior Therapy Dates: 3 months ago Prior Therapy Facilty/Provider(s): [facility] Reason for Treatment: Drug overdose  Prior Outpatient Therapy Prior  Outpatient Therapy: Yes Prior Therapy Dates: 2011 and 2015 Prior Therapy Facilty/Provider(s): [facility] [place] Reason for Treatment: depression, bipolar  ADL Screening (condition at time of admission) Patient's cognitive ability adequate to safely complete daily activities?: Yes Is the patient deaf or have difficulty hearing?: No Does the patient have difficulty seeing, even when wearing glasses/contacts?: No Does the patient have difficulty concentrating, remembering, or making decisions?: No Patient able to express need for assistance with ADLs?: Yes Does the patient have difficulty dressing or bathing?: No Independently performs ADLs?: Yes (appropriate for developmental age) Does the patient have difficulty walking or climbing stairs?: No Weakness of Legs: None Weakness of Arms/Hands: None  Home Assistive Devices/Equipment Home Assistive Devices/Equipment: None    Abuse/Neglect Assessment (Assessment to be complete while patient is alone) Physical Abuse: Denies Verbal Abuse: Denies Sexual Abuse: Denies Exploitation of patient/patient's resources: Denies Self-Neglect: Denies Values / Beliefs Cultural Requests During Hospitalization: None Spiritual Requests During Hospitalization: None   Advance Directives (For Healthcare) Does patient have an advance directive?: No Would patient like information on creating an advanced directive?: No - patient declined information Nutrition Screen- [facility] Adult/[facility] Patient's home diet: Regular  Additional Information 1:1 In Past 12 Months?: Yes CIRT Risk: No Elopement Risk: No Does patient have medical clearance?: Yes     Disposition:  Per Maryjean Morn, PA pt does not meet inpt criteria and can be discharged with OP referrals. Dr. Preston Fleeting will rescind IVC.   Clista Bernhardt, [facility] Triage Specialist 09/22/2013 1:33 AM   Glanda Spanbauer M 09/22/2013 1:22 AM

## 2013-09-22 NOTE — Discharge Instructions (Signed)
°  Emergency Department Resource Guide  [facility] Organization         Address  Phone  Notes  [facility]  [phone]   Angie Fava, PhD [address] [place], [place]   [phone] or [phone]   [facility]   [address] [place], [place] [phone]   [facility] [address] [place], [place] [phone] Insurance/Medicaid/sponsorship through [facility] [address]                                    [place], [place] [phone] Therapy/tele-psych/case  [facility] [address], [place] [phone]    Dr. Lolly Mustache  [phone]   [facility]  [employer] [facility] Dept. 1) [address] [place] 2) [address], [place] 3)  [address], [place] [phone]  [phone]   [facility] Child Abuse Hotline [phone] or [phone] (After Hours)

## 2013-11-15 ENCOUNTER — Ambulatory Visit: Payer: BC Managed Care – PPO | Admitting: Family Medicine

## 2014-10-13 ENCOUNTER — Encounter (HOSPITAL_COMMUNITY): Payer: Self-pay | Admitting: *Deleted

## 2014-10-13 ENCOUNTER — Emergency Department (HOSPITAL_COMMUNITY)
Admission: EM | Admit: 2014-10-13 | Discharge: 2014-10-13 | Disposition: A | Discharge status: Home / Self Care | Payer: Medicaid Other | Attending: Emergency Medicine | Admitting: Emergency Medicine

## 2014-10-13 DIAGNOSIS — Z720 Tobacco use: Secondary | ICD-10-CM | POA: Insufficient documentation

## 2014-10-13 DIAGNOSIS — F1210 Cannabis abuse, uncomplicated: Secondary | ICD-10-CM | POA: Insufficient documentation

## 2014-10-13 DIAGNOSIS — Z0000 Encounter for general adult medical examination without abnormal findings: Secondary | ICD-10-CM | POA: Insufficient documentation

## 2014-10-13 DIAGNOSIS — Z8659 Personal history of other mental and behavioral disorders: Secondary | ICD-10-CM | POA: Insufficient documentation

## 2014-10-13 NOTE — ED Provider Notes (Signed)
CSN: [id number]     Arrival date & time 10/13/14  1742 History   First MD Initiated Contact with Patient 10/13/14 1853     Chief Complaint  Patient presents with  . V70.1     (Consider location/radiation/quality/duration/timing/severity/associated sxs/prior Treatment) HPI .Marland Kitchen... Patient encouraged to come to the emergency department by his probation officer. Apparently probation officers made a home visit today and found a small amount of marijuana in his room. They were concerned that he might need detox. Additionally he took 2 pain tablets last night for dental pain. He is also uses recreational marijuana. No suicidal or homicidal ideation. No psychosis.  Past Medical History  Diagnosis Date  . ADD (attention deficit disorder)   . Allergic rhinitis    History reviewed. No pertinent past surgical history. No family history on file. Social History  Substance Use Topics  . Smoking status: Current Every Day Smoker -- 1.00 packs/day  . Smokeless tobacco: None  . Alcohol Use: Yes     Comment: occasionaly    Review of Systems  All other systems reviewed and are negative.     Allergies  Review of patient's allergies indicates no known allergies.  Home Medications   Prior to Admission medications   Medication Sig Start Date End Date Taking? Authorizing Provider  cyclobenzaprine (FLEXERIL) 10 MG tablet Take 10 mg by mouth every 8 (eight) hours as needed for muscle spasms.    Historical Provider, MD  HYDROcodone-acetaminophen (NORCO) 10-325 MG per tablet Take 1 tablet by mouth every 6 (six) hours as needed. pain    Historical Provider, MD   BP 138/91 mmHg  Pulse 105  Temp(Src) 98.2 F (36.8 C) (Oral)  Resp 16  Ht 5\' 9"  (1.753 m)  Wt 175 lb (79.379 kg)  BMI 25.83 kg/m2  SpO2 96% Physical Exam  Constitutional: He is oriented to person, place, and time. He appears well-developed and well-nourished.  HENT:  Head: Normocephalic and atraumatic.  Eyes: Conjunctivae and EOM are  normal. Pupils are equal, round, and reactive to light.  Neck: Normal range of motion. Neck supple.  Cardiovascular: Normal rate and regular rhythm.   Pulmonary/Chest: Effort normal and breath sounds normal.  Abdominal: Soft. Bowel sounds are normal.  Musculoskeletal: Normal range of motion.  Neurological: He is alert and oriented to person, place, and time.  Skin: Skin is warm and dry.  Psychiatric: He has a normal mood and affect. His behavior is normal.  Nursing note and vitals reviewed.   ED Course  Procedures (including critical care time) Labs Review Labs Reviewed - No data to display  Imaging Review No results found. I have personally reviewed and evaluated these images and lab results as part of my medical decision-making.   EKG Interpretation None      MDM   Final diagnoses:  Normal physical exam    Patient had normal physical exam. No suicidal or homicidal ideation. No psychosis. I encouraged patient to avoid any nonprescribed medication or street drugs    Donnetta Hutching, MD 10/13/14 [phone]

## 2014-10-13 NOTE — Discharge Instructions (Signed)
Avoid any behavior that will get you in trouble with your probation officer.

## 2014-10-13 NOTE — ED Notes (Addendum)
Pt states that his probation officer showed up at his house and found marijuana on him. States that she wanted him to come and make sure he didn't need any kind of detox. Pt states he took 2 pain pills for dental pain that were not prescribed to him last night as well. States the only other drug used is marijuana. Pt denies SI, HI.

## 2014-11-25 ENCOUNTER — Ambulatory Visit: Payer: Self-pay | Admitting: Nurse Practitioner

## 2015-04-03 ENCOUNTER — Encounter (HOSPITAL_COMMUNITY): Payer: Self-pay | Admitting: Emergency Medicine

## 2015-04-03 ENCOUNTER — Emergency Department (HOSPITAL_COMMUNITY)
Admission: EM | Admit: 2015-04-03 | Discharge: 2015-04-03 | Disposition: A | Discharge status: Home / Self Care | Payer: Self-pay | Attending: Emergency Medicine | Admitting: Emergency Medicine

## 2015-04-03 ENCOUNTER — Emergency Department (HOSPITAL_COMMUNITY): Payer: Self-pay

## 2015-04-03 DIAGNOSIS — S0990XA Unspecified injury of head, initial encounter: Secondary | ICD-10-CM | POA: Insufficient documentation

## 2015-04-03 DIAGNOSIS — F4310 Post-traumatic stress disorder, unspecified: Secondary | ICD-10-CM | POA: Insufficient documentation

## 2015-04-03 DIAGNOSIS — S0285XA Fracture of orbit, unspecified, initial encounter for closed fracture: Secondary | ICD-10-CM

## 2015-04-03 DIAGNOSIS — Y929 Unspecified place or not applicable: Secondary | ICD-10-CM | POA: Insufficient documentation

## 2015-04-03 DIAGNOSIS — Y999 Unspecified external cause status: Secondary | ICD-10-CM | POA: Insufficient documentation

## 2015-04-03 DIAGNOSIS — Y939 Activity, unspecified: Secondary | ICD-10-CM | POA: Insufficient documentation

## 2015-04-03 DIAGNOSIS — F17210 Nicotine dependence, cigarettes, uncomplicated: Secondary | ICD-10-CM | POA: Insufficient documentation

## 2015-04-03 DIAGNOSIS — S00511A Abrasion of lip, initial encounter: Secondary | ICD-10-CM | POA: Insufficient documentation

## 2015-04-03 DIAGNOSIS — S0232XA Fracture of orbital floor, left side, initial encounter for closed fracture: Secondary | ICD-10-CM | POA: Insufficient documentation

## 2015-04-03 HISTORY — DX: Unspecified injury of head, initial encounter: S09.90XA

## 2015-04-03 MED ORDER — CYCLOPENTOLATE-PHENYLEPHRINE OP SOLN OPTIME - NO CHARGE
OPHTHALMIC | Status: AC
  Filled 2015-04-03: qty 2

## 2015-04-03 MED ORDER — CYCLOPENTOLATE HCL 1 % OP SOLN
2.0000 [drp] | Freq: Once | OPHTHALMIC | Status: DC
Start: 2015-04-03 — End: 2015-04-03
  Filled 2015-04-03: qty 2

## 2015-04-03 MED ORDER — HYDROCODONE-ACETAMINOPHEN 5-325 MG PO TABS: 1.0000 | ORAL_TABLET | ORAL | Status: DC | PRN

## 2015-04-03 MED ORDER — CYCLOPENTOLATE-PHENYLEPHRINE 0.2-1 % OP SOLN
2.0000 [drp] | Freq: Once | OPHTHALMIC | Status: AC
  Administered 2015-04-03: 2 [drp] via OPHTHALMIC

## 2015-04-03 MED ORDER — OXYCODONE-ACETAMINOPHEN 5-325 MG PO TABS
1.0000 | ORAL_TABLET | Freq: Once | ORAL | Status: AC
  Administered 2015-04-03: 1 via ORAL
  Filled 2015-04-03: qty 1

## 2015-04-03 MED ORDER — AMOXICILLIN 500 MG PO CAPS: 500.0000 mg | ORAL_CAPSULE | Freq: Three times a day (TID) | ORAL | Status: AC

## 2015-04-03 MED ORDER — AMOXICILLIN 250 MG PO CAPS
500.0000 mg | ORAL_CAPSULE | Freq: Once | ORAL | Status: AC
  Administered 2015-04-03: 500 mg via ORAL
  Filled 2015-04-03: qty 2
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
## 2015-04-03 NOTE — Discharge Instructions (Signed)
Take the entire course of antibiotics to help avoid infection around your eye as it currently communicates with the broken sinus as discussed.  You may take the oxycodone prescribed for pain relief.  This will make you drowsy - do not drive within 4 hours of taking this medication.  Call Dr. Pollyann Kennedy as discussed.
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
## 2016-06-29 ENCOUNTER — Emergency Department (HOSPITAL_COMMUNITY)
Admission: EM | Admit: 2016-06-29 | Discharge: 2016-06-30 | Disposition: A | Discharge status: Other Acute Inpatient Hospital | Payer: Medicaid Other | Attending: Emergency Medicine | Admitting: Emergency Medicine

## 2016-06-29 ENCOUNTER — Encounter (HOSPITAL_COMMUNITY): Payer: Self-pay | Admitting: Emergency Medicine

## 2016-06-29 DIAGNOSIS — S61411A Laceration without foreign body of right hand, initial encounter: Secondary | ICD-10-CM | POA: Insufficient documentation

## 2016-06-29 DIAGNOSIS — Y929 Unspecified place or not applicable: Secondary | ICD-10-CM | POA: Insufficient documentation

## 2016-06-29 DIAGNOSIS — X780XXA Intentional self-harm by sharp glass, initial encounter: Secondary | ICD-10-CM | POA: Insufficient documentation

## 2016-06-29 DIAGNOSIS — Z79899 Other long term (current) drug therapy: Secondary | ICD-10-CM | POA: Insufficient documentation

## 2016-06-29 DIAGNOSIS — Y999 Unspecified external cause status: Secondary | ICD-10-CM | POA: Insufficient documentation

## 2016-06-29 DIAGNOSIS — R45851 Suicidal ideations: Secondary | ICD-10-CM

## 2016-06-29 DIAGNOSIS — Y939 Activity, unspecified: Secondary | ICD-10-CM | POA: Insufficient documentation

## 2016-06-29 DIAGNOSIS — F17210 Nicotine dependence, cigarettes, uncomplicated: Secondary | ICD-10-CM | POA: Insufficient documentation

## 2016-06-29 DIAGNOSIS — R454 Irritability and anger: Secondary | ICD-10-CM

## 2016-06-29 DIAGNOSIS — S61412A Laceration without foreign body of left hand, initial encounter: Secondary | ICD-10-CM | POA: Insufficient documentation

## 2016-06-29 LAB — COMPREHENSIVE METABOLIC PANEL WITH GFR
ALT: 16 U/L — ABNORMAL LOW (ref 17–63)
AST: 20 U/L (ref 15–41)
Albumin: 4.2 g/dL (ref 3.5–5.0)
Alkaline Phosphatase: 59 U/L (ref 38–126)
Anion gap: 6 (ref 5–15)
BUN: 7 mg/dL (ref 6–20)
CO2: 30 mmol/L (ref 22–32)
Calcium: 9.4 mg/dL (ref 8.9–10.3)
Chloride: 105 mmol/L (ref 101–111)
Creatinine, Ser: 0.79 mg/dL (ref 0.61–1.24)
GFR calc Af Amer: >60 mL/min
GFR calc non Af Amer: >60 mL/min
Glucose, Bld: 101 mg/dL — ABNORMAL HIGH (ref 65–99)
Potassium: 3.5 mmol/L (ref 3.5–5.1)
Sodium: 141 mmol/L (ref 135–145)
Total Bilirubin: 0.8 mg/dL (ref 0.3–1.2)
Total Protein: 7 g/dL (ref 6.5–8.1)

## 2016-06-29 LAB — RAPID URINE DRUG SCREEN, HOSP PERFORMED
Amphetamines: NOT DETECTED
Barbiturates: NOT DETECTED
Benzodiazepines: POSITIVE — AB
Cocaine: NOT DETECTED
Opiates: NOT DETECTED
Tetrahydrocannabinol: POSITIVE — AB

## 2016-06-29 LAB — CBC WITH DIFFERENTIAL/PLATELET
Basophils Absolute: 0 10*3/uL (ref 0.0–0.1)
Basophils Relative: 0 %
Eosinophils Absolute: 0.2 10*3/uL (ref 0.0–0.7)
Eosinophils Relative: 3 %
HCT: 41.5 % (ref 39.0–52.0)
Hemoglobin: 14 g/dL (ref 13.0–17.0)
Lymphocytes Relative: 16 %
Lymphs Abs: 1.1 10*3/uL (ref 0.7–4.0)
MCH: 28.5 pg (ref 26.0–34.0)
MCHC: 33.7 g/dL (ref 30.0–36.0)
MCV: 84.3 fL (ref 78.0–100.0)
Monocytes Absolute: 0.8 10*3/uL (ref 0.1–1.0)
Monocytes Relative: 11 %
Neutro Abs: 5 10*3/uL (ref 1.7–7.7)
Neutrophils Relative %: 70 %
Platelets: 125 10*3/uL — ABNORMAL LOW (ref 150–400)
RBC: 4.92 MIL/uL (ref 4.22–5.81)
RDW: 12.8 % (ref 11.5–15.5)
WBC: 7.1 10*3/uL (ref 4.0–10.5)

## 2016-06-29 LAB — ETHANOL: Alcohol, Ethyl (B): <5 mg/dL

## 2016-06-29 MED ORDER — IBUPROFEN 400 MG PO TABS: 600.0000 mg | ORAL_TABLET | Freq: Three times a day (TID) | ORAL | Status: DC | PRN

## 2016-06-29 MED ORDER — ONDANSETRON HCL 4 MG PO TABS: 4.0000 mg | ORAL_TABLET | Freq: Three times a day (TID) | ORAL | Status: DC | PRN

## 2016-06-29 MED ORDER — ACETAMINOPHEN 325 MG PO TABS: 650.0000 mg | ORAL_TABLET | ORAL | Status: DC | PRN

## 2016-06-29 NOTE — Progress Notes (Signed)
Pt meets criteria for in-patient treatment per Leighton Ruff.  Referrals sent to the following: [address],  [place],  [address],  [address],  [address],  [place],  [place] T. Kaylyn Lim, MSW, LCSWA Clinical Social Work Disposition [phone]
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
## 2016-06-29 NOTE — ED Notes (Signed)
Pt ambulated to restroom & returned to room w/ no complications. 
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
## 2016-06-29 NOTE — ED Notes (Addendum)
Pt informed he was going to staying here while he is being placed for in patient treatment. Pt asking to use the phone, pt informed he could use the phone. Pt called family & started w/ profanity. [facility] & this nurse advised that language would not be allowed.

## 2016-06-29 NOTE — ED Notes (Signed)
After TTS patient had a fit of anger throwing his drink and yelling. When this nurse asked the patient why he was angry he said, "Its because that lady doesn't believe what I said. She wants to keep me." Patient states his main concern is his dad who can't take care of himself at home.  Patient was verbally deescalated using calming conversation.
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
## 2016-06-30 ENCOUNTER — Inpatient Hospital Stay (HOSPITAL_COMMUNITY)
Admission: AD | Admit: 2016-06-30 | Discharge: 2016-07-05 | DRG: 897 | Disposition: A | Discharge status: Home / Self Care | Payer: No Typology Code available for payment source | Attending: Psychiatry | Admitting: Psychiatry

## 2016-06-30 ENCOUNTER — Encounter (HOSPITAL_COMMUNITY): Payer: Self-pay | Admitting: *Deleted

## 2016-06-30 DIAGNOSIS — Z813 Family history of other psychoactive substance abuse and dependence: Secondary | ICD-10-CM | POA: Diagnosis not present

## 2016-06-30 DIAGNOSIS — F17210 Nicotine dependence, cigarettes, uncomplicated: Secondary | ICD-10-CM | POA: Diagnosis present

## 2016-06-30 DIAGNOSIS — F1994 Other psychoactive substance use, unspecified with psychoactive substance-induced mood disorder: Secondary | ICD-10-CM | POA: Diagnosis present

## 2016-06-30 DIAGNOSIS — F1120 Opioid dependence, uncomplicated: Secondary | ICD-10-CM | POA: Clinically undetermined

## 2016-06-30 DIAGNOSIS — Z87820 Personal history of traumatic brain injury: Secondary | ICD-10-CM

## 2016-06-30 DIAGNOSIS — F988 Other specified behavioral and emotional disorders with onset usually occurring in childhood and adolescence: Secondary | ICD-10-CM | POA: Diagnosis present

## 2016-06-30 DIAGNOSIS — R45851 Suicidal ideations: Secondary | ICD-10-CM | POA: Diagnosis present

## 2016-06-30 DIAGNOSIS — F19929 Other psychoactive substance use, unspecified with intoxication, unspecified: Secondary | ICD-10-CM | POA: Diagnosis present

## 2016-06-30 DIAGNOSIS — F1220 Cannabis dependence, uncomplicated: Secondary | ICD-10-CM | POA: Clinically undetermined

## 2016-06-30 DIAGNOSIS — F1290 Cannabis use, unspecified, uncomplicated: Secondary | ICD-10-CM | POA: Diagnosis not present

## 2016-06-30 DIAGNOSIS — F419 Anxiety disorder, unspecified: Secondary | ICD-10-CM | POA: Diagnosis present

## 2016-06-30 DIAGNOSIS — F1390 Sedative, hypnotic, or anxiolytic use, unspecified, uncomplicated: Secondary | ICD-10-CM | POA: Diagnosis not present

## 2016-06-30 HISTORY — DX: Anxiety disorder, unspecified: F41.9

## 2016-06-30 HISTORY — DX: Unspecified osteoarthritis, unspecified site: M19.90

## 2016-06-30 MED ORDER — ACETAMINOPHEN 325 MG PO TABS: 650.0000 mg | ORAL_TABLET | Freq: Four times a day (QID) | ORAL | Status: DC | PRN

## 2016-06-30 MED ORDER — MAGNESIUM HYDROXIDE 400 MG/5ML PO SUSP: 30.0000 mL | Freq: Every day | ORAL | Status: DC | PRN

## 2016-06-30 MED ORDER — ALUM & MAG HYDROXIDE-SIMETH 200-200-20 MG/5ML PO SUSP: 30.0000 mL | ORAL | Status: DC | PRN

## 2016-06-30 MED ORDER — LORAZEPAM 1 MG PO TABS: 1.0000 mg | ORAL_TABLET | ORAL | Status: DC | PRN

## 2016-06-30 MED ORDER — QUETIAPINE FUMARATE 100 MG PO TABS
100.0000 mg | ORAL_TABLET | Freq: Every evening | ORAL | Status: DC | PRN
  Administered 2016-06-30 – 2016-07-04 (×8): 100 mg via ORAL
  Filled 2016-06-30: qty 1
  Filled 2016-06-30 (×2): qty 14
  Filled 2016-06-30 (×13): qty 1

## 2016-06-30 MED ORDER — ZIPRASIDONE MESYLATE 20 MG IM SOLR: 20.0000 mg | INTRAMUSCULAR | Status: DC | PRN

## 2016-06-30 MED ORDER — RISPERIDONE 2 MG PO TBDP: 2.0000 mg | ORAL_TABLET | Freq: Three times a day (TID) | ORAL | Status: DC | PRN

## 2016-06-30 MED ORDER — HYDROXYZINE HCL 25 MG PO TABS
25.0000 mg | ORAL_TABLET | Freq: Four times a day (QID) | ORAL | Status: DC | PRN
  Administered 2016-07-01: 25 mg via ORAL
  Filled 2016-06-30: qty 10
  Filled 2016-06-30: qty 1

## 2016-06-30 NOTE — Tx Team (Signed)
Initial Treatment Plan 06/30/2016 10:21 PM Philip Thompson [id number]    PATIENT STRESSORS: Loss of brother and sister within past 10 months Substance abuse   PATIENT STRENGTHS: Ability for insight Active sense of humor Average or above average intelligence Communication skills General fund of knowledge Physical Health Religious Affiliation Special hobby/interest Supportive family/friends Work skills   PATIENT IDENTIFIED PROBLEMS: "When I get real stressed out, how to calm down"  "How to deal with other angry people better"    Increased risk for suicide  Substance abuse  Anger Issues           DISCHARGE CRITERIA:  Ability to meet basic life and health needs Adequate post-discharge living arrangements Improved stabilization in mood, thinking, and/or behavior Motivation to continue treatment in a less acute level of care Need for constant or close observation no longer present Safe-care adequate arrangements made Verbal commitment to aftercare and medication compliance  PRELIMINARY DISCHARGE PLAN: Attend 12-step recovery group Participate in family therapy Placement in alternative living arrangements Return to previous living arrangement  PATIENT/FAMILY INVOLVEMENT: This treatment plan has been presented to and reviewed with the patient, Philip Thompson, and/or family member.  The patient and family have been given the opportunity to ask questions and make suggestions.  Doristine Johns, RN 06/30/2016, 10:21 PM

## 2016-06-30 NOTE — BHH Counselor (Signed)
Pt is a 26 year old male who presented to [facility] on 06/29/16 under IVC.  Mother reported that Pt is increasingly aggressive toward her and family, and that Pt was suicidal.  Reassessed Pt this morning.  Pt denied suicidal ideation, homicidal ideation, hallucination. Pt: "I didn't think my mom would take this far.  I don't think anyone thought it would go this far."  Pt said that he wanted to go home.  Per IVC paperwork, Pt threatened to kill himself numerous times over the weekend, and he also has a TBI.  Per Dr. Lucianne Muss, Pt meets inpt criteria.

## 2016-06-30 NOTE — Progress Notes (Signed)
Pt accepted to [facility], Bed 501-2.  Elta Guadeloupe, NP accepting, Dr. Elna Breslow attending.  Call report to [phone]. Pt may arrive at  20:00.  Philip Thompson. Kaylyn Lim, MSW, LCSWA Clinical Social Work Disposition [phone]

## 2016-07-01 DIAGNOSIS — Z813 Family history of other psychoactive substance abuse and dependence: Secondary | ICD-10-CM

## 2016-07-01 MED ORDER — DIVALPROEX SODIUM 250 MG PO DR TAB
250.0000 mg | DELAYED_RELEASE_TABLET | Freq: Two times a day (BID) | ORAL | Status: DC
  Administered 2016-07-01 – 2016-07-05 (×9): 250 mg via ORAL
  Filled 2016-07-01 (×14): qty 1

## 2016-07-01 NOTE — Progress Notes (Signed)
Recreation Therapy Notes  Date: 07/01/16 Time: 1000 Location: 500 Hall Dayroom  Group Topic: Wellness  Goal Area(s) Addresses:  Patient will define components of whole wellness. Patient will verbalize benefit of whole wellness.  Intervention: 2 small beach balls, chairs  Activity: Group Juggle.   Patients were arranged in a circle.  Patients were given one ball to start.  Patients were to get the ball going in a good rotation without it coming to a stop, but it the ball could bounce off the ground.  LRT would time how long the ball was in play.  Once the first ball has been in rotation for a while, LRT would add a second ball to the mix.  Patients were to keep both balls going for as long as they could.  Anytime the balls stopped, the time would start over.   Education: Wellness, Building control surveyor.   Education Outcome: Acknowledges education/In group clarification offered/Needs additional education.   Clinical Observations/Feedback: Pt did not attend group.   Caroll Rancher, LRT/CTRS         Caroll Rancher A 07/01/2016 12:13 PM

## 2016-07-01 NOTE — Tx Team (Signed)
Interdisciplinary Treatment and Diagnostic Plan Update  07/05/2016 Time of Session: 9:00 AM Philip Thompson MRN: [id number]  Principal Diagnosis: Bipolar Disorder  Secondary Diagnoses: Principal Problem:   Substance or medication-induced bipolar and related disorder with onset during intoxication [facility]) Active Problems:   Opioid use disorder, moderate, dependence (HCC)   Cannabis use disorder, severe, dependence ([place])   Current Medications:  Current Facility-Administered Medications  Medication Dose Route Frequency Provider Last Rate Last Dose  . acetaminophen (TYLENOL) tablet 650 mg  650 mg Oral Q6H PRN Laverle Hobby, PA-C      . alum & mag hydroxide-simeth (MAALOX/MYLANTA) 200-200-20 MG/5ML suspension 30 mL  30 mL Oral Q4H PRN Patriciaann Clan E, PA-C      . divalproex (DEPAKOTE) DR tablet 250 mg  250 mg Oral Q12H Ambrose Finland, MD   250 mg at 07/01/16 1027  . hydrOXYzine (ATARAX/VISTARIL) tablet 25 mg  25 mg Oral Q6H PRN Patriciaann Clan E, PA-C   25 mg at 07/01/16 1027  . risperiDONE (RISPERDAL M-TABS) disintegrating tablet 2 mg  2 mg Oral Q8H PRN Laverle Hobby, PA-C       And  . LORazepam (ATIVAN) tablet 1 mg  1 mg Oral PRN Patriciaann Clan E, PA-C       And  . ziprasidone (GEODON) injection 20 mg  20 mg Intramuscular PRN Patriciaann Clan E, PA-C      . magnesium hydroxide (MILK OF MAGNESIA) suspension 30 mL  30 mL Oral Daily PRN Laverle Hobby, PA-C      . QUEtiapine (SEROQUEL) tablet 100 mg  100 mg Oral QHS,MR X 1 Laverle Hobby, PA-C   100 mg at 06/30/16 2338    PTA Medications: No prescriptions prior to admission.    Treatment Modalities: Medication Management, Group therapy, Case management,  1 to 1 session with clinician, Psychoeducation, Recreational therapy.   Physician Treatment Plan for Primary Diagnosis: Substance or medication-induced bipolar and related disorder with onset during intoxication ([place]) Long Term Goal(s): Improvement in symptoms so as  ready for discharge  Short Term Goals: Ability to identify changes in lifestyle to reduce recurrence of condition will improve Ability to verbalize feelings will improve Ability to disclose and discuss suicidal ideas Ability to demonstrate self-control will improve Ability to identify and develop effective coping behaviors will improve Ability to maintain clinical measurements within normal limits will improve Compliance with prescribed medications will improve Ability to identify triggers associated with substance abuse/mental health issues will improve  Medication Management: Evaluate patient's response, side effects, and tolerance of medication regimen.  Therapeutic Interventions: 1 to 1 sessions, Unit Group sessions and Medication administration.  Evaluation of Outcomes: Adequate for discharge   Physician Treatment Plan for Secondary Diagnosis: Principal Problem:   Substance or medication-induced bipolar and related disorder with onset during intoxication ([place]) Active Problems:   Opioid use disorder, moderate, dependence ([place])   Cannabis use disorder, severe, dependence ([place])   Long Term Goal(s): Improvement in symptoms so as ready for discharge  Short Term Goals: Ability to identify changes in lifestyle to reduce recurrence of condition will improve Ability to verbalize feelings will improve Ability to disclose and discuss suicidal ideas Ability to demonstrate self-control will improve Ability to identify and develop effective coping behaviors will improve Ability to maintain clinical measurements within normal limits will improve Compliance with prescribed medications will improve Ability to identify triggers associated with substance abuse/mental health issues will improve  Medication Management: Evaluate patient's response, side effects, and tolerance of  medication regimen.  Therapeutic Interventions: 1 to 1 sessions, Unit Group sessions and Medication  administration.  Evaluation of Outcomes: Adequate for discharge    RN Treatment Plan for Primary Diagnosis: Bipolar Disorder Long Term Goal(s): Knowledge of disease and therapeutic regimen to maintain health will improve  Short Term Goals: Ability to identify and develop effective coping behaviors will improve and Compliance with prescribed medications will improve  Medication Management: RN will administer medications as ordered by provider, will assess and evaluate patient's response and provide education to patient for prescribed medication. RN will report any adverse and/or side effects to prescribing provider.  Therapeutic Interventions: 1 on 1 counseling sessions, Psychoeducation, Medication administration, Evaluate responses to treatment, Monitor vital signs and CBGs as ordered, Perform/monitor CIWA, COWS, AIMS and Fall Risk screenings as ordered, Perform wound care treatments as ordered.  Evaluation of Outcomes: Adequate for discharge    LCSW Treatment Plan for Primary Diagnosis: Bipolar Disorder Long Term Goal(s): Safe transition to appropriate next level of care at discharge, Engage patient in therapeutic group addressing interpersonal concerns.  Short Term Goals: Engage patient in aftercare planning with referrals and resources  Therapeutic Interventions: Assess for all discharge needs, 1 to 1 time with Social worker, Explore available resources and support systems, Assess for adequacy in community support network, Educate family and significant other(s) on suicide prevention, Complete Psychosocial Assessment, Interpersonal group therapy.  Evaluation of Outcomes: Met  Return home, follow up outpt   Progress in Treatment: Attending groups: Yes Participating in groups: Yes Taking medication as prescribed: Yes Toleration medication: Yes, no side effects reported at this time Family/Significant other contact made: No Patient understands diagnosis: Yes AEB asking for help with  mood-primarily depression and anger Discussing patient identified problems/goals with staff: Yes Medical problems stabilized or resolved: Yes Denies suicidal/homicidal ideation: Yes Issues/concerns per patient self-inventory: None Other: N/A  New problem(s) identified: None identified at this time.   New Short Term/Long Term Goal(s): None identified at this time.   Discharge Plan or Barriers: Patient will discharge home and follow-up with [facility].  Reason for Continuation of Hospitalization:  Depression Anger Medication stabilization   Estimated Length of Stay: 3-5 days  Attendees: Patient: 07/05/2016  10:52 AM  Physician: Ursula Alert, MD 07/05/2016  10:52 AM  Nursing: Gaylan Gerold, RN 07/05/2016  10:52 AM  RN Care Manager:  07/05/2016  10:52 AM  Social Worker: Glorious Peach, MSW, LCSW-A 07/05/2016  10:52 AM  Recreational Therapist:   07/05/2016  10:52 AM  Other:  07/05/2016  10:52 AM  Other:  07/05/2016  10:52 AM    Scribe for Treatment Team:  Glorious Peach, MSW, LCSW-A  07/05/2016 10:52 AM
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
## 2016-07-01 NOTE — Progress Notes (Signed)
DAR NOTE: Patient presents with anxious affect and depressed mood.  Denies auditory and visual hallucinations.  Described energy level as low and concentration as good.  Rates depression at 2, hopelessness at 2, and anxiety at 3.  Maintained on routine safety checks.  Medications given as prescribed.  Support and encouragement offered as needed.  Attended group and participated.  States goal for today is "to go home."  Patient was visible in the dayroom with minimal interaction with peers.  Vistaril 25 mg given for anxiety with good effect.

## 2016-07-01 NOTE — BHH Counselor (Signed)
Adult Comprehensive Assessment  Patient ID: ARIZ LIFSEY, male   DOB: 10-31-1990, 26 y.o.   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  "It seemed like a good idea at the timje.  I was really drunk."  Back pain and neck pain-left hip pain Substance abuse: Admits to smoking cannabis daily.  Admits to benzo and opiate use-buys off of the street-but minimizes.  Admits to DUI charge in past, to being drunk when he fell off of the bridge  Living/Environment/Situation:  Living Arrangements: Parent How long has patient lived in current situation?: all my life What is atmosphere in current home: Comfortable, Supportive, Chaotic  Family History:  Marital status: Single Are you sexually active?: No What is your sexual orientation?: straight Does patient have children?: No  Childhood History:  By whom was/is the patient raised?: Both parents Description of patient's relationship with caregiver when they were a child: good Patient's description of current relationship with people who raised him/her: "It's rough.  they're fixin to separate Does patient have siblings?: Yes Number of Siblings: 7 Description of patient's current relationship with siblings: all step siblings, 2 died within the past 10 mos-1 died of hepatitis, i died in his sleep Did patient suffer any verbal/emotional/physical/sexual abuse as a child?: No Did patient suffer from severe childhood neglect?: No Has patient ever been sexually abused/assaulted/raped as an adolescent or adult?: No Was the patient ever a victim of a crime or a  disaster?: No Witnessed domestic violence?: No Has patient been effected by domestic violence as an adult?: No  Education:  Highest grade of school patient has completed: 12, plus 1 semester Currently a student?: No Learning disability?: No  Employment/Work Situation:   Employment situation: Employed 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 How long has patient been employed?: since age of 2 Patient's job has been impacted by current illness: No What is the longest time patient has a held a job?: see above Has patient ever been in the [employer]?: No Are There Guns or Other Weapons in Your Home?: No  Financial Resources:   Financial resources: Income from employment, Support from parents / caregiver Does patient have a Lawyer or guardian?: No  Alcohol/Substance Abuse:   What has been your use of drugs/alcohol within the last 12 months?: Weed dailyt, Benzos periodically, optiates every once in awhile,  If attempted suicide, did drugs/alcohol play a role in this?: No Has alcohol/substance abuse ever caused legal problems?: Yes (DUI and possession, 10 days in jail)  Social Support System:   Patient's Community Support System: Good Describe Community Support System: Parents, 2 best friends Type of faith/religion: Ephriam Knuckles How does patient's faith help to cope with current illness?: N/A  Leisure/Recreation:   Leisure and Hobbies: racing go Nurse, learning disability  Strengths/Needs:   What things does the patient do well?: working on Chiropodist, woodworking 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, dispensing  Discharge Plan:   Does patient have access to transportation?: Yes Will patient be returning to same living situation after discharge?: Yes Currently receiving community mental health services: No If no, would patient  like referral for services when discharged?: Yes (What county?) [facility]) Does patient have  financial barriers related to discharge medications?: Yes Patient description of barriers related to discharge medications: no insurance, limited income  Summary/Recommendations:   Summary and Recommendations (to be completed by the evaluator): Coolidge is a 26 YO Caucasian male diagnosed with Bipolar D/O and Polysubstance Use.  He was IVC'd by parents due to out of contro behavior at home, including aggression towards parents and dog, and SI.  He deneis and minimizes these symptoms, as well as substance abuse issues.  Zackari states he has TBI, frontal lobe trauma, from a fall he took from a bridege when he was doing pull-ups over the side in a drunken stupor.  Liandro can benefit from crises stabilization, medicaiton management, therapeutic milieu and referral for services.  Ida Rogue. 07/01/2016

## 2016-07-01 NOTE — B[facility] Group Notes (Signed)
[facility] Group Therapy  07/01/2016 , 10:59 AM    Type of Therapy:  M[facility] [employer] Presentation  Participation Level:  Active  Participation Quality:  Attentive  Affect:  Blunted  Cognitive:  Oriented  Insight:  Limited  Engagement in Therapy:  Engaged  Modes of Intervention:  Discussion, Education and Socialization  Summary of Progress/Problems:  Onalee Hua from [facility] came to present his recovery story and play the guitar.  Stayed the entire time, engaged throughout.  Daryel Gerald B 07/01/2016 , 10:59 AM

## 2016-07-01 NOTE — BHH Suicide Risk Assessment (Signed)
[facility] Admission Suicide Risk Assessment   Nursing information obtained from:  Patient Demographic factors:  Male, Caucasian, Unemployed Current Mental Status:  NA Loss Factors:  Loss of significant relationship Historical Factors:  Family history of mental illness or substance abuse Risk Reduction Factors:  Sense of responsibility to family, Living with another person, especially a relative, Positive social support  Total Time spent with patient: 30 minutes Principal Problem: <principal problem not specified> Diagnosis:   Patient Active Problem List   Diagnosis Date Noted  . Substance induced mood disorder (HCC) [F19.94] 06/30/2016  . Overdose [T50.901A] 08/28/2013  . Alcohol intoxication (HCC) [F10.929] 08/28/2013  . Physically aggressive behavior [R46.89] 08/28/2013  . Severely aggressive behavior [R46.89] 08/28/2013  . Marijuana abuse [F12.10] 08/28/2013  . Suicidal ideation [R45.851] 08/28/2013  . Homicidal ideation [R45.850] 08/28/2013  . Insomnia [G47.00] 02/20/2013  . Chronic back pain [M54.9, G89.29] 07/05/2012   Subjective Data: Patient is a 26 years old young male admitted from [facility] with IVC for increased agitation and aggression. He has history of TBI, substance abuse and no current medication treatment.    Continued Clinical Symptoms:  Alcohol Use Disorder Identification Test Final Score (AUDIT): 0 The "Alcohol Use Disorders Identification Test", Guidelines for Use in Primary Care, Second Edition.  World Science writer [facility]). Score between 0-7:  no or low risk or alcohol related problems. Score between 8-15:  moderate risk of alcohol related problems. Score between 16-19:  high risk of alcohol related problems. Score 20 or above:  warrants further diagnostic evaluation for alcohol dependence and treatment.   CLINICAL FACTORS:   Severe Anxiety and/or Agitation Bipolar Disorder:   Mixed State Depression:   Aggression Impulsivity Recent sense of  peace/wellbeing Alcohol/Substance Abuse/Dependencies More than one psychiatric diagnosis Unstable or Poor Therapeutic Relationship Previous Psychiatric Diagnoses and Treatments Medical Diagnoses and Treatments/Surgeries   Musculoskeletal: Strength & Muscle Tone: within normal limits Gait & Station: normal Patient leans: N/A  Psychiatric Specialty Exam: Physical Exam  ROS  Blood pressure 113/82, pulse 100, temperature 98.6 F (37 C), resp. rate 18, height 5\' 8"  (1.727 m), weight 74.8 kg (165 lb).Body mass index is 25.09 kg/m.  General Appearance: Guarded  Eye Contact:  Good  Speech:  Clear and Coherent  Volume:  Normal  Mood:  Angry, Depressed and Irritable  Affect:  Constricted, Inappropriate and Labile  Thought Process:  Coherent and Goal Directed  Orientation:  Full (Time, Place, and Person)  Thought Content:  WDL and Rumination  Suicidal Thoughts:  No  Homicidal Thoughts:  No  Memory:  Immediate;   Good Recent;   Fair Remote;   Fair  Judgement:  Impaired  Insight:  Fair  Psychomotor Activity:  Decreased  Concentration:  Concentration: Fair and Attention Span: Fair  Recall:  Good  Fund of Knowledge:  Good  Language:  Good  Akathisia:  Negative  Handed:  Right  AIMS (if indicated):     Assets:  Communication Skills Desire for Improvement Financial Resources/Insurance Housing Leisure Time Resilience Social Support Talents/Skills Transportation Vocational/Educational  ADL's:  Intact  Cognition:  Impaired,  Mild  Sleep:  Number of Hours: 5.25      COGNITIVE FEATURES THAT CONTRIBUTE TO RISK:  Closed-mindedness, Loss of executive function and Polarized thinking    SUICIDE RISK:   Mild:  Suicidal ideation of limited frequency, intensity, duration, and specificity.  There are no identifiable plans, no associated intent, mild dysphoria and related symptoms, good self-control (both objective and subjective assessment), few other risk factors,  and identifiable  protective factors, including available and accessible social support.  PLAN OF CARE: Admitted from [facility] with IVC for increased agitation and aggression. He has history of TBI, substance abuse and no current medication treatment. He needs crisis stabilization, medication management and safety monitoring.   I certify that inpatient services furnished can reasonably be expected to improve the patient's condition.   Leata Mouse, MD 07/01/2016, 9:40 AM

## 2016-07-02 LAB — TSH: TSH: 1.648 u[IU]/mL (ref 0.350–4.500)

## 2016-07-02 LAB — LIPID PANEL
CHOL/HDL RATIO: 3.8 ratio
Cholesterol: 124 mg/dL (ref 0–200)
HDL: 33 mg/dL — AB (ref >40)
LDL CALC: 77 mg/dL (ref 0–99)
Triglycerides: 69 mg/dL (ref <150)
VLDL: 14 mg/dL (ref 0–40)

## 2016-07-02 LAB — T4, FREE: FREE T4: 0.95 ng/dL (ref 0.61–1.12)

## 2016-07-02 NOTE — BHH Suicide Risk Assessment (Addendum)
[facility] INPATIENT:  Family/Significant Other Suicide Prevention Education  Suicide Prevention Education:  Education Completed; Mother, Philip Thompson, mother, [phone],  (name of family member/significant other) has been identified by the patient as the family member/significant other with whom the patient will be residing, and identified as the person(s) who will aid the patient in the event of a mental health crisis (suicidal ideations/suicide attempt).  With written consent from the patient, the family member/significant other has been provided the following suicide prevention education, prior to the and/or following the discharge of the patient.  The suicide prevention education provided includes the following:  Suicide risk factors  Suicide prevention and interventions  National Suicide Hotline telephone number  [facility] assessment telephone number  [facility] Emergency Assistance 911  [facility] and/or Residential Mobile Crisis Unit telephone number  Request made of family/significant other to:  Remove weapons (e.g., guns, rifles, knives), all items previously/currently identified as safety concern.    Remove drugs/medications (over-the-counter, prescriptions, illicit drugs), all items previously/currently identified as a safety concern.  The family member/significant other verbalizes understanding of the suicide prevention education information provided.  The family member/significant other agrees to remove the items of safety concern listed above.  Wants information on patients treatment plan and diagnosis.  Mother visited 6/14 and felt "he looked really good" "as sad as the situation is, I think he's doing as well as he can be."  Is tearful about son's decompensation and current sickness.  Mother took out IVC due to aggressive behavior and suicidal threats.  "I don't know if he was just going through a mental breakdown but I know he's had this mental thing  going on from Friday - Tuesday, felt like I didn't have any other options."  Mother feels patient's symptoms have significantly increased since patient fell off bridge 2 - 3 years ago and sustained TBI.  Mother has discussed patient going to [facility] in the past, CSW discussed discharge planning, mother is willing to transport to appointments and at discharge.  Mother wonders if patient has opioid addiction after being prescribed opioids to deal w chronic pain after fall from bridge.  After major medical incident approx 2 years ago when patient was airlifted to [facility], PCP decided to discontinue opioid prescribing. Reviewed safety planning w mother - father has locked all medications in safe in the home, mother will secure patient's medications if patient allows.  Mother will ensure that patient has no access to unsecured firearms in the home.  Patient plans to discharge to live w father in his home; mother visits the home daily and is very available to patient during the day (she works night shifts).     Mother states that others in the family have been diagnosed with Bipolar Disorder and she wonders if patient may have this diagnosis as well.  Philip Thompson 07/02/2016, 12:08 PM

## 2016-07-02 NOTE — BHH Group Notes (Signed)
[facility] LCSW Group Therapy   07/02/2016 1:30 PM   Type of Therapy: Group Therapy   Participation Level: Active   Participation Quality: Attentive, Sharing and Supportive   Affect: Appropriate   Cognitive: Alert and Oriented   Insight: Developing/Improving and Engaged   Engagement in Therapy: Developing/Improving and Engaged   Modes of Intervention: Clarification, Confrontation, Discussion, Education, Exploration, Limit-setting, Orientation, Problem-solving, Rapport Building, Dance movement psychotherapist, Socialization and Support   Summary of Progress/Problems: The topic for today was feelings about relapse. Pt discussed what relapse prevention is to them and identified triggers that they are on the path to relapse. Pt processed their feeling towards relapse and was able to relate to peers. Pt discussed coping skills that can be used for relapse prevention. Patient engaged throughout group. He stated triggers for relapse is feeling judged for past and substance abuse. Patient identified medication compliance and community support as ways to order to reduce relapse.   Hampton Abbot, MSW, LCSW-A 07/02/2016, 3:06PM

## 2016-07-02 NOTE — Progress Notes (Signed)
D: Pt denies SI/HI/AVH. Pt stated he was ok , stated he was doing better.   A: Pt was offered support and encouragement. Pt was given scheduled medications. Pt was encourage to attend groups. Q 15 minute checks were done for safety.   R:Pt attends groups and interacts well with peers and staff. Pt is taking medication. Pt has no complaints.Pt receptive to treatment and safety maintained on unit.

## 2016-07-02 NOTE — Plan of Care (Signed)
Problem: Safety: Goal: Periods of time without injury will increase Outcome: Progressing Pt safe at this time   

## 2016-07-02 NOTE — Progress Notes (Signed)
   D: Pt approached the writer with an intense stare. When asked about his day pt stated, "good".  Then stated, "Now I know what is expected of me and how the days go", referring to the schedule of the unit. Writer observed pt with a visitor whom he stated was his mother. Pt has no questions or concerns.    A:  Support and encouragement was offered. 15 min checks continued for safety.  R: Pt remains safe.

## 2016-07-02 NOTE — Progress Notes (Signed)
Adult Psychoeducational Group Note  Date:  07/02/2016 Time:  9:29 PM  Group Topic/Focus:  Wrap-Up Group:   The focus of this group is to help patients review their daily goal of treatment and discuss progress on daily workbooks.  Participation Level:  Minimal  Participation Quality:  Appropriate  Affect:  Appropriate  Cognitive:  Appropriate  Insight: Appropriate  Engagement in Group:  Engaged  Modes of Intervention:  Socialization and Support  Additional Comments:  Patient attended and participated in group tonight. He reports that today he started to open up, he is not feeling as anxious.  He went to the gym today and his mother visited with him today  Scot Dock 07/02/2016, 9:29 PM

## 2016-07-02 NOTE — Progress Notes (Signed)
Pt did not attend the evening karaoke group. Pt stayed back on the unit during group. [name], NT 07/01/16 

## 2016-07-03 DIAGNOSIS — F1390 Sedative, hypnotic, or anxiolytic use, unspecified, uncomplicated: Secondary | ICD-10-CM

## 2016-07-03 DIAGNOSIS — G4700 Insomnia, unspecified: Secondary | ICD-10-CM

## 2016-07-03 DIAGNOSIS — F1290 Cannabis use, unspecified, uncomplicated: Secondary | ICD-10-CM

## 2016-07-03 DIAGNOSIS — F419 Anxiety disorder, unspecified: Secondary | ICD-10-CM

## 2016-07-03 LAB — HEMOGLOBIN A1C
HEMOGLOBIN A1C: 5.4 % (ref 4.8–5.6)
MEAN PLASMA GLUCOSE: 108 mg/dL

## 2016-07-03 LAB — PROLACTIN: Prolactin: 11.4 ng/mL (ref 4.0–15.2)

## 2016-07-03 NOTE — Progress Notes (Signed)
D: Pt denies SI/HI/AVH. Pt is pleasant and cooperative. Pt stated he was feeling better due to the medications, and feeling more at ease and relaxed. Pt stated he wanted to know when he was leaving.   A: Pt was offered support and encouragement. Pt was given scheduled medications. Pt was encourage to attend groups. Q 15 minute checks were done for safety.   R:Pt attends groups and interacts well with peers and staff. Pt is taking medication. Pt has no complaints.Pt receptive to treatment and safety maintained on unit.

## 2016-07-03 NOTE — Plan of Care (Signed)
Problem: Coping: Goal: Ability to cope will improve Outcome: Progressing Pt denies SI at this time   

## 2016-07-03 NOTE — Progress Notes (Signed)
D: Patient's self inventory sheet: patient has good sleep, recieved sleep medication.fair  Appetite, normal energy level, good concentration. Rated depression 1/10, hopeless 1/10, anxiety 1/10. SI/HI/AVH: denies all. Physical complaints are denied. Goal is "learn to better control my anxiety". Plans to work on "talking with my peers".   A: Medications administered, assessed medication knowledge and education given on medication regimen.  Emotional support and encouragement given patient. R: Denies SI and HI , contracts for safety. Safety maintained with 15 minute checks.
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
## 2016-07-03 NOTE — [facility] Group Notes (Signed)
[facility] LCSW Group Therapy Note  07/03/2016  and  11:15 AM  Type of Therapy and Topic:  Group Therapy: Avoiding Self-Sabotaging and Enabling Behaviors  Participation Level:  Active  Participation Quality:  Attentive and Sharing  Affect:  Flat  Cognitive:  Alert and Oriented  Insight:  Developing/Improving  Engagement in Therapy:  Developing/Improving   Therapeutic models used: Cognitive Behavioral Therapy,  Person-Centered Therapy and Motivational Interviewing  Modes of Intervention:  Discussion, Exploration, Orientation, Rapport Building, Socialization and Support   Summary of Progress/Problems:  The main focus of today's process group was for the patient to identify ways in which they have in the past sabotaged their own recovery. Motivational Interviewing was utilized to identify motivation they may have for wanting to change. The patient identified signs of his tendency to hyper focus and negative outcomes of same.   Carney Bern, LCSW

## 2016-07-03 NOTE — Progress Notes (Signed)
Adult Psychoeducational Group Note  Date:  07/03/2016 Time:  9:38 PM  Group Topic/Focus:  Wrap-Up Group:   The focus of this group is to help patients review their daily goal of treatment and discuss progress on daily workbooks.  Participation Level:  Minimal  Participation Quality:  Intrusive and Sharing  Affect:  Flat  Cognitive:  Oriented  Insight: Improving  Engagement in Group:  Engaged  Modes of Intervention:  Socialization and Support  Additional Comments:  Patient attended and participated in group tonight. She reports having a good day. His mother visited and he socialized with peers. His coping skills is watching nature.  Lita Mains [facility] 07/03/2016, 9:38 PM

## 2016-07-04 NOTE — Progress Notes (Signed)
D: Patient states "I think am doing better". Patient's mother visited this evening and patient stated "It was positive". Seen on dayroom interacting with peers. Verbalizes no concern. Denies pain, SI/HI, AH/VH at this time. No behavior issues noted.  A: Staff offered support and encouragement as needed. Routine safety checks maintained. Will continue to monitor patient.  R: Patient remains safe on unit.
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
## 2016-07-04 NOTE — Progress Notes (Signed)
D: Patient's self inventory sheet: patient has fair sleep, received sleep medication.good  Appetite, normal energy level, good concentration. Rated depression 1/10, hopeless 0/10, anxiety 1/10. SI/HI/AVH: denies all. Physical complaints are denied. Goal is "call and talk to father, go outside to talk with peers about coping skills". Plans to work on "talk with peers and go to all groups".   A: Medications administered, assessed medication knowledge and education given on medication regimen.  Emotional support and encouragement given patient. R: Denies SI and HI , contracts for safety. Safety maintained with 15 minute checks.

## 2016-07-04 NOTE — [facility] Group Notes (Signed)
[facility] LCSW Group Therapy  07/04/2016 11 AM  Type of Therapy:  Group Therapy  Participation Level:  Active  Participation Quality:  Attentive and sharing  Affect:  Appropriate  Cognitive:  Alert and Oriented  Insight:  Developing/Improving  Engagement in Therapy:  Developing/Improving  Modes of Intervention:  Discussion, Education, Exploration, Problem-solving, Socialization and Support  Summary of Progress/Problems: Topic for today was thoughts and feelings regarding discharge. We discussed fears of upcoming changes including judgements, expectations and stigma of mental health issues. We then discussed supports: what constitutes a supportive framework, identification of supports and what to do when others are not supportive. Patients then discussed a specific coping tool to use when others are not available. P:atient processed important qualities for him, in a support.   Carney Bern, LCSW

## 2016-07-05 DIAGNOSIS — F1120 Opioid dependence, uncomplicated: Secondary | ICD-10-CM | POA: Clinically undetermined

## 2016-07-05 DIAGNOSIS — F1994 Other psychoactive substance use, unspecified with psychoactive substance-induced mood disorder: Principal | ICD-10-CM

## 2016-07-05 DIAGNOSIS — F19929 Other psychoactive substance use, unspecified with intoxication, unspecified: Secondary | ICD-10-CM | POA: Diagnosis present

## 2016-07-05 DIAGNOSIS — F1220 Cannabis dependence, uncomplicated: Secondary | ICD-10-CM | POA: Clinically undetermined

## 2016-07-05 DIAGNOSIS — F17210 Nicotine dependence, cigarettes, uncomplicated: Secondary | ICD-10-CM

## 2016-07-05 LAB — VALPROIC ACID LEVEL: VALPROIC ACID LVL: 40 ug/mL — AB (ref 50.0–100.0)

## 2016-07-05 MED ORDER — NICOTINE POLACRILEX 2 MG MT GUM
CHEWING_GUM | OROMUCOSAL | Status: AC
  Filled 2016-07-05: qty 1

## 2016-07-05 MED ORDER — NICOTINE POLACRILEX 2 MG MT GUM: 2.0000 mg | CHEWING_GUM | OROMUCOSAL | 0 refills | Status: DC | PRN

## 2016-07-05 MED ORDER — DIVALPROEX SODIUM 250 MG PO DR TAB: 250.0000 mg | DELAYED_RELEASE_TABLET | Freq: Two times a day (BID) | ORAL | 0 refills | Status: DC

## 2016-07-05 MED ORDER — HYDROXYZINE HCL 25 MG PO TABS: 25.0000 mg | ORAL_TABLET | Freq: Four times a day (QID) | ORAL | 0 refills | Status: DC | PRN

## 2016-07-05 MED ORDER — DIVALPROEX SODIUM 500 MG PO DR TAB
500.0000 mg | DELAYED_RELEASE_TABLET | Freq: Two times a day (BID) | ORAL | Status: DC
  Filled 2016-07-05: qty 14
  Filled 2016-07-05 (×2): qty 1
  Filled 2016-07-05: qty 14

## 2016-07-05 MED ORDER — QUETIAPINE FUMARATE 100 MG PO TABS: 100.0000 mg | ORAL_TABLET | Freq: Every evening | ORAL | 0 refills | Status: DC | PRN

## 2016-07-05 MED ORDER — DIVALPROEX SODIUM 500 MG PO DR TAB: 500.0000 mg | DELAYED_RELEASE_TABLET | Freq: Two times a day (BID) | ORAL | 0 refills | Status: DC

## 2016-07-05 MED ORDER — NICOTINE POLACRILEX 2 MG MT GUM
2.0000 mg | CHEWING_GUM | OROMUCOSAL | Status: DC | PRN
  Administered 2016-07-05: 2 mg via ORAL

## 2016-07-05 NOTE — BHH Suicide Risk Assessment (Signed)
[facility] Discharge Suicide Risk Assessment   Principal Problem: Substance or medication-induced bipolar and related disorder with onset during intoxication [facility]) Discharge Diagnoses:  Patient Active Problem List   Diagnosis Date Noted  . Substance or medication-induced bipolar and related disorder with onset during intoxication (HCC) [F19.94] 07/05/2016  . Opioid use disorder, moderate, dependence (HCC) [F11.20] 07/05/2016  . Cannabis use disorder, severe, dependence (HCC) [F12.20] 07/05/2016  . Overdose [T50.901A] 08/28/2013  . Alcohol intoxication (HCC) [F10.929] 08/28/2013  . Physically aggressive behavior [R46.89] 08/28/2013  . Severely aggressive behavior [R46.89] 08/28/2013  . Marijuana abuse [F12.10] 08/28/2013  . Suicidal ideation [R45.851] 08/28/2013  . Homicidal ideation [R45.850] 08/28/2013  . Insomnia [G47.00] 02/20/2013  . Chronic back pain [M54.9, G89.29] 07/05/2012    Total Time spent with patient: 30 minutes  Musculoskeletal: Strength & Muscle Tone: within normal limits Gait & Station: normal Patient leans: N/A  Psychiatric Specialty Exam: Review of Systems  Psychiatric/Behavioral: Positive for substance abuse. Negative for depression, hallucinations and suicidal ideas.  All other systems reviewed and are negative.   Blood pressure 111/77, pulse 88, temperature 97.5 F (36.4 C), temperature source Oral, resp. rate 20, height 5\' 8"  (1.727 m), weight 74.8 kg (165 lb).Body mass index is 25.09 kg/m.  General Appearance: Casual  Eye Contact::  Fair  Speech:  Clear and Coherent409  Volume:  Normal  Mood:  Euthymic  Affect:  Congruent  Thought Process:  Goal Directed and Descriptions of Associations: Circumstantial  Orientation:  Full (Time, Place, and Person)  Thought Content:  Logical  Suicidal Thoughts:  No  Homicidal Thoughts:  No  Memory:  Immediate;   Fair Recent;   Fair Remote;   Fair  Judgement:  Fair  Insight:  Fair  Psychomotor Activity:  Normal   Concentration:  Fair  Recall:  [employer] of Knowledge:Fair  Language: Fair  Akathisia:  No  Handed:  Right  AIMS (if indicated):     Assets:  Communication Skills Desire for Improvement  Sleep:  Number of Hours: 6.25  Cognition: WNL  ADL's:  Intact   Mental Status Per Nursing Assessment::   On Admission:  NA  Demographic Factors:  Male and Caucasian  Loss Factors: NA  Historical Factors: Impulsivity  Risk Reduction Factors:   Positive social support and Positive therapeutic relationship  Continued Clinical Symptoms:  Previous Psychiatric Diagnoses and Treatments  Cognitive Features That Contribute To Risk:  None    Suicide Risk:  Minimal: No identifiable suicidal ideation.  Patients presenting with no risk factors but with morbid ruminations; may be classified as minimal risk based on the severity of the depressive symptoms  Follow-up Information    [facility], [facility]. Go on 07/07/2016.   Why:  Please follow-up with [facility] walk-in clinic on June 20th at 8:30AM. Please arrive on time for prompt services.  Contact information: [address] [place] [address] [phone]           Plan Of Care/Follow-up recommendations:  Activity:  no restrictions Diet:  regular Tests:  Depakpte level in 5 days - 07/09/16 in the AM prior to Depakote AM dose Other:  none  Mishel Sans, MD 07/05/2016, 11:28 AM

## 2016-07-05 NOTE — Progress Notes (Signed)
Patient ID: Philip Thompson, male   DOB: Jul 13, 1990, 26 y.o.   MRN: [id number]  DAR/Discharge Note- Pt. Denies SI/HI and A/V hallucinations. Patient appears with anxious affect and glaring eye contact on first approach however is cooperative and pleasant during interaction with this Clinical research associate. He reports he feels ready for discharge today. Belongings returned to patient at time of discharge. Patient denies any pain or discomfort. Discharge instructions and medications were reviewed with patient. Patient verbalized understanding of both medications and discharge instructions. Patient discharged to lobby where his ride was waiting for him. Q15 minute safety checks maintained until discharge. No distress upon discharge.

## 2016-07-05 NOTE — Progress Notes (Signed)
Recreation Therapy Notes  Date: 07/05/16 Time: 1000 Location: 500 [address] Dayroom  Group Topic: Coping Skills  Goal Area(s) Addresses:  Patients will be able to identify positive coping skills. Patients will be able to identify the benefits of using positive coping skills. Patients will be able to identify the benefits of using coping skills post d/c.  Behavioral Response: Engaged  Intervention: Orthoptist, pencils  Activity: Orthoptist.  Patients were given a worksheet of a blank spider web.  Patients were to identify the things that have gotten them stuck and brought them into the hospital.  Patients were to then identify coping skills they can use to help deal with these situations and put them outside the web.  Education: Pharmacologist, Building control surveyor.   Education Outcome: Acknowledges understanding/In group clarification offered/Needs additional education.   Clinical Observations/Feedback: Pt stated dealing with the deaths of his brother and sister, built up stress and helping his mother and dad deal with their feelings are what led him here because "I wasn't taking care of myself and let out aggression in the wrong way".  Pt stated his coping skills were listening to pod casts, working with hands (woodworking, build engines) and going to batting cages.  Pt stated using his coping skills will help him "take the time to focus on myself".   Philip Thompson, LRT/CTRS         Philip Thompson, [place] A 07/05/2016 11:55 AM

## 2016-07-05 NOTE — Progress Notes (Signed)
  [facility] Adult Case Management Discharge Plan :  Will you be returning to the same living situation after discharge:  Yes,  return home with mother. At discharge, do you have transportation home?: Yes,  mother will pick patient up. Do you have the ability to pay for your medications: No.  Release of information consent forms completed and in the chart;  Patient's signature needed at discharge.  Patient to Follow up at: Follow-up Information    Services, [facility]. Go on 07/07/2016.   Why:  Please follow-up with [facility] walk-in clinic on June 20th at 8:30AM. Please arrive on time for prompt services.  Contact information: [address] [place] [address] [phone]           Next level of care provider has access to [facility] Link:no  Safety Planning and Suicide Prevention discussed: Yes,  SPE completed with patient and pt's mother.  Have you used any form of tobacco in the last 30 days? (Cigarettes, Smokeless Tobacco, Cigars, and/or Pipes): Yes  Has patient been referred to the Quitline?: Patient refused referral  Patient has been referred for addiction treatment: Yes  Lynden Oxford, MSW, LCSW-A 07/05/2016, 10:59 AM

## 2016-07-05 NOTE — Progress Notes (Signed)
Adult Psychoeducational Group Note  Date:  07/05/2016 Time:  1:32 AM  Group Topic/Focus:  Wrap-Up Group:   The focus of this group is to help patients review their daily goal of treatment and discuss progress on daily workbooks.  Participation Level:  Active  Participation Quality:  Appropriate and Attentive  Affect:  Appropriate  Cognitive:  Alert and Appropriate  Insight: Appropriate and Good  Engagement in Group:  Engaged  Modes of Intervention:  Discussion  Additional Comments:  Patient stated his goal for today was participate in even recreation and to interact with peers. Patient stated he felt great when he achieved his goal today. Patient rated his day a 8 out of 10. Patient stated something positive that happened today was he had a great conversation on the phone with his father and his mother came by to visit him today. Patient stated his goal for tomorrow was to open up more to his peers.   Felipa Furnace 07/05/2016, 1:32 AM
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
## 2016-09-11 ENCOUNTER — Encounter (HOSPITAL_COMMUNITY): Payer: Self-pay | Admitting: Adult Health

## 2016-09-11 ENCOUNTER — Emergency Department (HOSPITAL_COMMUNITY)
Admission: EM | Admit: 2016-09-11 | Discharge: 2016-09-12 | Disposition: A | Discharge status: Home / Self Care | Payer: Self-pay | Attending: Emergency Medicine | Admitting: Emergency Medicine

## 2016-09-11 DIAGNOSIS — F558 Abuse of other non-psychoactive substances: Secondary | ICD-10-CM | POA: Insufficient documentation

## 2016-09-11 DIAGNOSIS — R339 Retention of urine, unspecified: Secondary | ICD-10-CM | POA: Insufficient documentation

## 2016-09-11 DIAGNOSIS — F17210 Nicotine dependence, cigarettes, uncomplicated: Secondary | ICD-10-CM | POA: Insufficient documentation

## 2016-09-11 DIAGNOSIS — Z79899 Other long term (current) drug therapy: Secondary | ICD-10-CM | POA: Insufficient documentation

## 2016-09-11 DIAGNOSIS — R451 Restlessness and agitation: Secondary | ICD-10-CM | POA: Insufficient documentation

## 2016-09-11 DIAGNOSIS — F1910 Other psychoactive substance abuse, uncomplicated: Secondary | ICD-10-CM

## 2016-09-11 LAB — COMPREHENSIVE METABOLIC PANEL
ALT: 11 U/L — AB (ref 17–63)
AST: 14 U/L — ABNORMAL LOW (ref 15–41)
Albumin: 4.4 g/dL (ref 3.5–5.0)
Alkaline Phosphatase: 59 U/L (ref 38–126)
Anion gap: 6 (ref 5–15)
BUN: 13 mg/dL (ref 6–20)
CHLORIDE: 104 mmol/L (ref 101–111)
CO2: 29 mmol/L (ref 22–32)
CREATININE: 0.84 mg/dL (ref 0.61–1.24)
Calcium: 9.4 mg/dL (ref 8.9–10.3)
GFR calc non Af Amer: >60 mL/min (ref >60)
Glucose, Bld: 94 mg/dL (ref 65–99)
POTASSIUM: 3.9 mmol/L (ref 3.5–5.1)
Sodium: 139 mmol/L (ref 135–145)
Total Bilirubin: 0.6 mg/dL (ref 0.3–1.2)
Total Protein: 6.9 g/dL (ref 6.5–8.1)

## 2016-09-11 LAB — CBC WITH DIFFERENTIAL/PLATELET
Basophils Absolute: 0 10*3/uL (ref 0.0–0.1)
Basophils Relative: 0 %
EOS PCT: 2 %
Eosinophils Absolute: 0.2 10*3/uL (ref 0.0–0.7)
HCT: 39.6 % (ref 39.0–52.0)
Hemoglobin: 13.2 g/dL (ref 13.0–17.0)
LYMPHS ABS: 1.7 10*3/uL (ref 0.7–4.0)
Lymphocytes Relative: 15 %
MCH: 28.5 pg (ref 26.0–34.0)
MCHC: 33.3 g/dL (ref 30.0–36.0)
MCV: 85.5 fL (ref 78.0–100.0)
MONO ABS: 1.4 10*3/uL — AB (ref 0.1–1.0)
MONOS PCT: 12 %
Neutro Abs: 8.5 10*3/uL — ABNORMAL HIGH (ref 1.7–7.7)
Neutrophils Relative %: 71 %
Platelets: 122 10*3/uL — ABNORMAL LOW (ref 150–400)
RBC: 4.63 MIL/uL (ref 4.22–5.81)
RDW: 14.1 % (ref 11.5–15.5)
WBC: 11.8 10*3/uL — ABNORMAL HIGH (ref 4.0–10.5)

## 2016-09-11 LAB — RAPID URINE DRUG SCREEN, HOSP PERFORMED
AMPHETAMINES: POSITIVE — AB
BENZODIAZEPINES: NOT DETECTED
Barbiturates: NOT DETECTED
COCAINE: NOT DETECTED
Opiates: NOT DETECTED
Tetrahydrocannabinol: POSITIVE — AB

## 2016-09-11 LAB — ETHANOL

## 2016-09-11 MED ORDER — ACETAMINOPHEN 325 MG PO TABS: 650.0000 mg | ORAL_TABLET | ORAL | Status: DC | PRN

## 2016-09-11 MED ORDER — NICOTINE 21 MG/24HR TD PT24
21.0000 mg | MEDICATED_PATCH | Freq: Every day | TRANSDERMAL | Status: DC
  Administered 2016-09-12: 21 mg via TRANSDERMAL
  Filled 2016-09-11 (×2): qty 1

## 2016-09-11 MED ORDER — QUETIAPINE FUMARATE 100 MG PO TABS
100.0000 mg | ORAL_TABLET | Freq: Every evening | ORAL | Status: DC | PRN
  Administered 2016-09-11: 100 mg via ORAL
  Filled 2016-09-11 (×2): qty 1

## 2016-09-11 MED ORDER — DIVALPROEX SODIUM 250 MG PO DR TAB
500.0000 mg | DELAYED_RELEASE_TABLET | Freq: Two times a day (BID) | ORAL | Status: DC
  Administered 2016-09-11 – 2016-09-12 (×2): 500 mg via ORAL
  Filled 2016-09-11 (×2): qty 2

## 2016-09-11 MED ORDER — HYDROXYZINE HCL 25 MG PO TABS
25.0000 mg | ORAL_TABLET | Freq: Four times a day (QID) | ORAL | Status: DC | PRN
  Administered 2016-09-11: 25 mg via ORAL
  Filled 2016-09-11: qty 1

## 2016-09-11 MED ORDER — ALUM & MAG HYDROXIDE-SIMETH 200-200-20 MG/5ML PO SUSP: 30.0000 mL | Freq: Four times a day (QID) | ORAL | Status: DC | PRN

## 2016-09-11 NOTE — ED Triage Notes (Signed)
PResents with IVC papers, PT cooperative. IVC papers state that mother reports pt is violent and threatening to kill self and father. PER pt and Field seismologist, pt cares for demented father in home and mother comes into town once a day to see them, she has done this paperwork on him multiple times. PT denies thoughts of SI, HI and denies violence. PT is anxious. HE reports that, "I told my mom something that was personal about myself and it made her mad. I don't want to share what it was but it upset her and unsettled her sna that is why she did this"
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
## 2016-09-11 NOTE — ED Notes (Signed)
Patient called father to check on his well being. Father stated to patient that he was not welcomed to come back to the house. Then he called mother and became argumentative  With her, states that she needs to come up her and tell the truth about why he is up here. Patient is very angry with mother stating that he is not going to tell them anything. Patient seems to have crying spell .
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
## 2016-09-12 DIAGNOSIS — F411 Generalized anxiety disorder: Secondary | ICD-10-CM

## 2016-09-12 DIAGNOSIS — F1220 Cannabis dependence, uncomplicated: Secondary | ICD-10-CM

## 2016-09-12 DIAGNOSIS — F1910 Other psychoactive substance abuse, uncomplicated: Secondary | ICD-10-CM

## 2016-09-12 DIAGNOSIS — F17210 Nicotine dependence, cigarettes, uncomplicated: Secondary | ICD-10-CM

## 2016-09-12 LAB — URINALYSIS, ROUTINE W REFLEX MICROSCOPIC
BILIRUBIN URINE: NEGATIVE
Glucose, UA: NEGATIVE mg/dL
Hgb urine dipstick: NEGATIVE
Ketones, ur: NEGATIVE mg/dL
Leukocytes, UA: NEGATIVE
Nitrite: NEGATIVE
PH: 7 (ref 5.0–8.0)
Protein, ur: NEGATIVE mg/dL
SPECIFIC GRAVITY, URINE: 1.005 (ref 1.005–1.030)

## 2016-09-12 MED ORDER — LIDOCAINE HCL 2 % EX GEL
1.0000 "application " | Freq: Once | CUTANEOUS | Status: AC
  Administered 2016-09-12: 1 via TOPICAL
  Filled 2016-09-12: qty 10

## 2016-09-12 MED ORDER — HYDROXYZINE HCL 25 MG PO TABS
25.0000 mg | ORAL_TABLET | Freq: Three times a day (TID) | ORAL | Status: DC | PRN
  Administered 2016-09-12: 25 mg via ORAL
  Filled 2016-09-12: qty 1

## 2016-09-12 MED ORDER — QUETIAPINE FUMARATE 100 MG PO TABS
100.0000 mg | ORAL_TABLET | Freq: Once | ORAL | Status: AC
  Administered 2016-09-12: 100 mg via ORAL

## 2016-09-12 NOTE — ED Notes (Signed)
Pt up and down to bathroom around 10 times in the past hour. When asked about it pt states that this began "a few hours ago", pt broken out in cold sweat and states he is having difficulty urinating and defecating. RN Thayer Ohm [place] notified.
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
## 2016-09-12 NOTE — ED Notes (Signed)
Bladder scan performed, results showed >999 ml of urine, Dr Bebe Shaggy notified,

## 2016-09-12 NOTE — Progress Notes (Signed)
Per Ferne Reus NP, patient does not meet inpatient treatment criteria and was recommended discharge. [facility] RN Mindy was notified.  Melbourne Abts, MSW, LCSWA Clinical social worker in disposition [facility], TTS Office [phone] and [phone] 09/12/2016 11:38 AM

## 2016-09-12 NOTE — ED Notes (Signed)
Pt c/o not being able to urinate again.  Bladder scan showed >630ml.  In and out done and returned.  Pt refused indwelling foley.

## 2016-09-12 NOTE — ED Notes (Addendum)
Pt states to this tech and to RN that he has been having urinary issues for months and that when he told his mother about this she IVC'd him to get him to a hospital.

## 2016-09-12 NOTE — Discharge Instructions (Signed)
Follow up with your family md  °

## 2016-09-12 NOTE — BH Assessment (Signed)
Pt is cooperative and oriented x 4. He reports he didn't sleep well d/t his having a catheter but then he slept better earlier this am. He denies SI and HI. Pt denies [facility] and no delusions noted. Pt reports he has "been doing really good" since d/c from [facility] this past June. He reports he is complaint with his psych meds. Pt reports his mom came to visit from [place] last night, and he told her about his ongoing health issues (urinary). He says he told her that eventually he needed to go to a hospital but he wasn't ready to go yet. Pt reports mom then said he had to go to the hospital and she left to go to magistrate. Pt says he hasn't seen or spoken with mom since last night. [name] disposition pending discussion with TTS provider.

## 2016-09-12 NOTE — ED Provider Notes (Signed)
Pt had episode of urinary retention Bladder scan revealed urine >106ml He has had this previously No new back pain  no leg weakness Will monitor in ED while awaiting psych dispo    Zadie Rhine, MD 09/12/16 [phone]

## 2016-09-12 NOTE — ED Notes (Signed)
Pt states that he told his mother that he has been having problems with his bowels and bladder for " couple of months". She ivc'd him to try and get him some medical help because he would not get it himself. Pt had 1300 mls of straw colored urine out with an in and out catheter.

## 2016-10-10 ENCOUNTER — Encounter (HOSPITAL_COMMUNITY): Payer: Self-pay | Admitting: *Deleted

## 2016-10-10 ENCOUNTER — Emergency Department (HOSPITAL_COMMUNITY)
Admission: EM | Admit: 2016-10-10 | Discharge: 2016-10-11 | Disposition: A | Discharge status: Other Acute Inpatient Hospital | Payer: Self-pay | Attending: Emergency Medicine | Admitting: Emergency Medicine

## 2016-10-10 DIAGNOSIS — F329 Major depressive disorder, single episode, unspecified: Secondary | ICD-10-CM

## 2016-10-10 DIAGNOSIS — F314 Bipolar disorder, current episode depressed, severe, without psychotic features: Secondary | ICD-10-CM | POA: Insufficient documentation

## 2016-10-10 DIAGNOSIS — Z79899 Other long term (current) drug therapy: Secondary | ICD-10-CM | POA: Insufficient documentation

## 2016-10-10 DIAGNOSIS — F17210 Nicotine dependence, cigarettes, uncomplicated: Secondary | ICD-10-CM | POA: Insufficient documentation

## 2016-10-10 DIAGNOSIS — F909 Attention-deficit hyperactivity disorder, unspecified type: Secondary | ICD-10-CM | POA: Insufficient documentation

## 2016-10-10 DIAGNOSIS — F32A Depression, unspecified: Secondary | ICD-10-CM

## 2016-10-10 LAB — CBC
HCT: 37.7 % — ABNORMAL LOW (ref 39.0–52.0)
HEMOGLOBIN: 12.7 g/dL — AB (ref 13.0–17.0)
MCH: 28.5 pg (ref 26.0–34.0)
MCHC: 33.7 g/dL (ref 30.0–36.0)
MCV: 84.7 fL (ref 78.0–100.0)
Platelets: 149 10*3/uL — ABNORMAL LOW (ref 150–400)
RBC: 4.45 MIL/uL (ref 4.22–5.81)
RDW: 13.9 % (ref 11.5–15.5)
WBC: 12.4 10*3/uL — ABNORMAL HIGH (ref 4.0–10.5)

## 2016-10-10 LAB — COMPREHENSIVE METABOLIC PANEL
ALBUMIN: 3.9 g/dL (ref 3.5–5.0)
ALK PHOS: 51 U/L (ref 38–126)
ALT: 9 U/L — ABNORMAL LOW (ref 17–63)
ANION GAP: 6 (ref 5–15)
AST: 12 U/L — ABNORMAL LOW (ref 15–41)
BILIRUBIN TOTAL: 0.4 mg/dL (ref 0.3–1.2)
BUN: 11 mg/dL (ref 6–20)
CALCIUM: 9 mg/dL (ref 8.9–10.3)
CO2: 28 mmol/L (ref 22–32)
CREATININE: 0.81 mg/dL (ref 0.61–1.24)
Chloride: 103 mmol/L (ref 101–111)
GFR calc Af Amer: >60 mL/min (ref >60)
GFR calc non Af Amer: >60 mL/min (ref >60)
GLUCOSE: 77 mg/dL (ref 65–99)
Potassium: 3.8 mmol/L (ref 3.5–5.1)
Sodium: 137 mmol/L (ref 135–145)
TOTAL PROTEIN: 6.2 g/dL — AB (ref 6.5–8.1)

## 2016-10-10 NOTE — ED Triage Notes (Signed)
Pt in with [employer] with IVC papers that the pt's mother took out on him. Pt denies SI/HI and states that what is on the IVC papers are fabricated to "get him out of the house."  Pt was asked about the comment he made about wanting to "hang himself in the woods where animals could eat his body so no one could find him."  Mother reports pt has became physically and verbally aggressive, is not taking his medications for depression as prescribed and is abusing opiates.

## 2016-10-10 NOTE — ED Notes (Signed)
ED Provider at bedside. 

## 2016-10-11 ENCOUNTER — Encounter (HOSPITAL_COMMUNITY): Payer: Self-pay | Admitting: *Deleted

## 2016-10-11 ENCOUNTER — Inpatient Hospital Stay (HOSPITAL_COMMUNITY)
Admission: AD | Admit: 2016-10-11 | Discharge: 2016-10-14 | DRG: 897 | Disposition: A | Discharge status: Home / Self Care | Payer: Federal, State, Local not specified - Other | Attending: Psychiatry | Admitting: Psychiatry

## 2016-10-11 DIAGNOSIS — M1990 Unspecified osteoarthritis, unspecified site: Secondary | ICD-10-CM | POA: Diagnosis present

## 2016-10-11 DIAGNOSIS — F1914 Other psychoactive substance abuse with psychoactive substance-induced mood disorder: Secondary | ICD-10-CM | POA: Diagnosis present

## 2016-10-11 DIAGNOSIS — Z811 Family history of alcohol abuse and dependence: Secondary | ICD-10-CM | POA: Diagnosis not present

## 2016-10-11 DIAGNOSIS — F329 Major depressive disorder, single episode, unspecified: Secondary | ICD-10-CM | POA: Diagnosis present

## 2016-10-11 DIAGNOSIS — J309 Allergic rhinitis, unspecified: Secondary | ICD-10-CM | POA: Diagnosis present

## 2016-10-11 DIAGNOSIS — Z818 Family history of other mental and behavioral disorders: Secondary | ICD-10-CM

## 2016-10-11 DIAGNOSIS — F17210 Nicotine dependence, cigarettes, uncomplicated: Secondary | ICD-10-CM | POA: Diagnosis present

## 2016-10-11 DIAGNOSIS — F19929 Other psychoactive substance use, unspecified with intoxication, unspecified: Secondary | ICD-10-CM | POA: Diagnosis present

## 2016-10-11 DIAGNOSIS — F988 Other specified behavioral and emotional disorders with onset usually occurring in childhood and adolescence: Secondary | ICD-10-CM | POA: Diagnosis present

## 2016-10-11 DIAGNOSIS — F419 Anxiety disorder, unspecified: Secondary | ICD-10-CM | POA: Diagnosis present

## 2016-10-11 DIAGNOSIS — R45851 Suicidal ideations: Secondary | ICD-10-CM | POA: Diagnosis present

## 2016-10-11 DIAGNOSIS — F1120 Opioid dependence, uncomplicated: Secondary | ICD-10-CM | POA: Diagnosis present

## 2016-10-11 DIAGNOSIS — F19129 Other psychoactive substance abuse with intoxication, unspecified: Principal | ICD-10-CM | POA: Diagnosis present

## 2016-10-11 DIAGNOSIS — F1220 Cannabis dependence, uncomplicated: Secondary | ICD-10-CM | POA: Diagnosis present

## 2016-10-11 DIAGNOSIS — M2550 Pain in unspecified joint: Secondary | ICD-10-CM | POA: Diagnosis present

## 2016-10-11 DIAGNOSIS — F319 Bipolar disorder, unspecified: Secondary | ICD-10-CM | POA: Diagnosis present

## 2016-10-11 DIAGNOSIS — F1994 Other psychoactive substance use, unspecified with psychoactive substance-induced mood disorder: Secondary | ICD-10-CM | POA: Diagnosis present

## 2016-10-11 DIAGNOSIS — F1590 Other stimulant use, unspecified, uncomplicated: Secondary | ICD-10-CM | POA: Clinically undetermined

## 2016-10-11 LAB — RAPID URINE DRUG SCREEN, HOSP PERFORMED
AMPHETAMINES: POSITIVE — AB
BARBITURATES: NOT DETECTED
Benzodiazepines: NOT DETECTED
Cocaine: NOT DETECTED
OPIATES: NOT DETECTED
TETRAHYDROCANNABINOL: POSITIVE — AB

## 2016-10-11 LAB — SALICYLATE LEVEL: Salicylate Lvl: <7 mg/dL (ref 2.8–30.0)

## 2016-10-11 LAB — ETHANOL

## 2016-10-11 LAB — ACETAMINOPHEN LEVEL: Acetaminophen (Tylenol), Serum: <10 ug/mL — ABNORMAL LOW (ref 10–30)

## 2016-10-11 LAB — VALPROIC ACID LEVEL: VALPROIC ACID LVL: 19 ug/mL — AB (ref 50.0–100.0)

## 2016-10-11 MED ORDER — MAGNESIUM HYDROXIDE 400 MG/5ML PO SUSP: 30.0000 mL | Freq: Every day | ORAL | Status: DC | PRN

## 2016-10-11 MED ORDER — ONDANSETRON HCL 4 MG PO TABS: 4.0000 mg | ORAL_TABLET | Freq: Three times a day (TID) | ORAL | Status: DC | PRN

## 2016-10-11 MED ORDER — HYDROXYZINE HCL 25 MG PO TABS
25.0000 mg | ORAL_TABLET | Freq: Four times a day (QID) | ORAL | Status: DC | PRN
  Administered 2016-10-11: 25 mg via ORAL
  Filled 2016-10-11: qty 1

## 2016-10-11 MED ORDER — NICOTINE 21 MG/24HR TD PT24
21.0000 mg | MEDICATED_PATCH | Freq: Once | TRANSDERMAL | Status: DC
  Administered 2016-10-11: 21 mg via TRANSDERMAL
  Filled 2016-10-11: qty 1

## 2016-10-11 MED ORDER — ACETAMINOPHEN 325 MG PO TABS: 650.0000 mg | ORAL_TABLET | ORAL | Status: DC | PRN

## 2016-10-11 MED ORDER — HYDROXYZINE HCL 25 MG PO TABS
25.0000 mg | ORAL_TABLET | Freq: Four times a day (QID) | ORAL | Status: DC | PRN
  Administered 2016-10-11 – 2016-10-13 (×4): 25 mg via ORAL
  Filled 2016-10-11: qty 1
  Filled 2016-10-11: qty 10
  Filled 2016-10-11 (×3): qty 1

## 2016-10-11 MED ORDER — ZOLPIDEM TARTRATE 5 MG PO TABS: 5.0000 mg | ORAL_TABLET | Freq: Every evening | ORAL | Status: DC | PRN

## 2016-10-11 MED ORDER — DIVALPROEX SODIUM 500 MG PO DR TAB
500.0000 mg | DELAYED_RELEASE_TABLET | Freq: Two times a day (BID) | ORAL | Status: DC
  Administered 2016-10-11 – 2016-10-14 (×6): 500 mg via ORAL
  Filled 2016-10-11 (×2): qty 1
  Filled 2016-10-11: qty 14
  Filled 2016-10-11 (×2): qty 1
  Filled 2016-10-11: qty 14
  Filled 2016-10-11 (×2): qty 1
  Filled 2016-10-11: qty 14
  Filled 2016-10-11 (×3): qty 1
  Filled 2016-10-11: qty 14
  Filled 2016-10-11: qty 1

## 2016-10-11 MED ORDER — DIVALPROEX SODIUM 250 MG PO DR TAB
500.0000 mg | DELAYED_RELEASE_TABLET | Freq: Two times a day (BID) | ORAL | Status: DC
  Administered 2016-10-11 (×2): 500 mg via ORAL
  Filled 2016-10-11 (×2): qty 2

## 2016-10-11 MED ORDER — LORAZEPAM 1 MG PO TABS
2.0000 mg | ORAL_TABLET | Freq: Once | ORAL | Status: AC | PRN
  Administered 2016-10-11: 2 mg via ORAL
  Filled 2016-10-11: qty 2

## 2016-10-11 MED ORDER — ALUM & MAG HYDROXIDE-SIMETH 200-200-20 MG/5ML PO SUSP: 30.0000 mL | ORAL | Status: DC | PRN

## 2016-10-11 MED ORDER — QUETIAPINE FUMARATE 100 MG PO TABS
100.0000 mg | ORAL_TABLET | Freq: Every evening | ORAL | Status: DC | PRN
  Administered 2016-10-11 (×2): 100 mg via ORAL
  Filled 2016-10-11: qty 1

## 2016-10-11 MED ORDER — QUETIAPINE FUMARATE 100 MG PO TABS
100.0000 mg | ORAL_TABLET | Freq: Every evening | ORAL | Status: DC | PRN
  Administered 2016-10-11 (×2): 100 mg via ORAL
  Filled 2016-10-11 (×6): qty 1

## 2016-10-11 MED ORDER — ACETAMINOPHEN 325 MG PO TABS
650.0000 mg | ORAL_TABLET | Freq: Four times a day (QID) | ORAL | Status: DC | PRN
Start: 2016-10-11 — End: 2016-10-14

## 2016-10-11 NOTE — Progress Notes (Signed)
Admission Note:  26 year old male who presents IVC, in no acute distress, for the treatment of SI and anxiety.  Patient appears anxious and fidgety on admission. Patient was cooperative with admission process. Patient denies SI and contracts for safety upon admission. Patient states "My dad who is in late stage dementia told my mom, following an argument between me and him, that I said I wanted to go out in the middle of the woods and hang myself for animals to eat".  Patient states "I never said such a thing and would not say that".  Patient denies AVH.  Patient identifies "watching my father slowly die of dementia" as his main stressor.  Patient lives with his father and identifies his "friends and family" as support system.  Patient reports previous [facility] admission in June 2018.  Patient denies substance abuse and states "I take an occasional Adderall every once in a while".  While at [facility], patient would like to "get out as fast as possible" and "see medication is adequate".  Skin was assessed and found to be clear of any abnormal marks.  Patient searched and no contraband found, POC and unit policies explained and understanding verbalized. Consents obtained. Food and fluids offered and accepted. Patient had no additional questions or concerns.

## 2016-10-11 NOTE — ED Provider Notes (Signed)
I was informed by nursing staff that patient had received a transfer acceptance at [facility] by Dr. Jama Flavors for further psychiatric evaluation and care.  They were previously medically cleared by Dr. Lynelle Doctor .  On my assessment patient appeared to be medically stable. Their affect was normal and they were upset about pending transfer.  VS were:   Vitals:   10/10/16 2213 10/11/16 0741  BP: 120/66 (!) 106/55  Pulse: 89 87  Resp: 18 16  Temp: 98.2 F (36.8 C) 97.8 F (36.6 C)  SpO2: 100% 99%   Patient repeatedly asked him to talk to me as he was not suicidal says there is some type of mixup. I talked to TTS he said that they reevaluated him twice and that was enough that they recommended inpatient and did not feel the need to reevaluate at this time. Patient was already involuntarily committed by his mother and with uncertainty of the situation, I will uphold the IVC and have him transferred to [facility] for further evaluation and management. Patient is understanding and begrudgingly in agreement with this plan.   I explained risks/benefits of transfer and patient understood.     Marily Memos, MD 10/11/16 1455

## 2016-10-11 NOTE — ED Notes (Addendum)
EDP is in with it now. Pt requesting to be re assessed by [facility] .  Order placed

## 2016-10-11 NOTE — ED Notes (Signed)
Called [facility] for transportation to [facility].

## 2016-10-11 NOTE — ED Notes (Signed)
Pt woke up asking sitter when he was leaving and the pt was notified that he meets inpatient criteria.

## 2016-10-11 NOTE — ED Notes (Signed)
Pt requested to use phone

## 2016-10-11 NOTE — ED Notes (Signed)
Pt meets inpatient criteria  

## 2016-10-11 NOTE — ED Notes (Signed)
MD at bedside. 
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
## 2016-10-11 NOTE — Progress Notes (Signed)
Adult Psychoeducational Group Note  Date:  10/11/2016 Time:  8:46 PM  Group Topic/Focus:  Wrap-Up Group:   The focus of this group is to help patients review their daily goal of treatment and discuss progress on daily workbooks.  Participation Level:  Active  Participation Quality:  Appropriate  Affect:  Appropriate  Cognitive:  Appropriate  Insight: Appropriate  Engagement in Group:  Engaged  Modes of Intervention:  Discussion  Additional Comments:  The patient expressed that he  rates today a 4 because he did not like the SAPPU.  Philip Thompson 10/11/2016, 8:46 PM

## 2016-10-11 NOTE — ED Notes (Signed)
Pt is getting extremely anxious. Is upset and cursing about his mother IVC'ing him. States, "That's all lies. This is fu**ing bull*hit. I want to talk to a doctor." Have notified Dr. Clayborne Dana.
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
## 2016-10-11 NOTE — Tx Team (Signed)
Initial Treatment Plan 10/11/2016 6:14 PM JAMILE DEPETRIS KAJ:[id number]    PATIENT STRESSORS: Marital or family conflict   PATIENT STRENGTHS: Ability for insight Average or above average intelligence Communication skills Physical Health Supportive family/friends   PATIENT IDENTIFIED PROBLEMS: At risk for suicide  Anxiety  "Get out as fast as possible"  "Have doctors see that medication I'm already taking is adequate"               DISCHARGE CRITERIA:  Ability to meet basic life and health needs Improved stabilization in mood, thinking, and/or behavior Motivation to continue treatment in a less acute level of care Verbal commitment to aftercare and medication compliance  PRELIMINARY DISCHARGE PLAN: Outpatient therapy Return to previous living arrangement  PATIENT/FAMILY INVOLVEMENT: This treatment plan has been presented to and reviewed with the patient, Lc L Kashani.  The patient and family have been given the opportunity to ask questions and make suggestions.  Carleene Overlie, RN 10/11/2016, 6:14 PM

## 2016-10-11 NOTE — ED Notes (Signed)
Dr. Erin Hearing in to speak pt again regarding disposition. [facility] continues to say pt will be inpatient

## 2016-10-11 NOTE — ED Notes (Addendum)
Per Dr. Delford Field notes, Dr. Lynelle Doctor feels as if the IVC papers should be rescinded. Dr.Knapp is not rescinding the IVC papers. Dr. Lynelle Doctor states she will not fill out/sign the certificate and the psychiatrist in the am can fill it out.

## 2016-10-11 NOTE — Progress Notes (Signed)
Per Berneice Heinrich , [facility], patient has been accepted to [facility], bed 305-1 ; Accepting provider is Nira Conn, NP; Attending provider is Dr. Jama Flavors .  Patient can arrive now, bed is ready. Number for report is [phone].    Jerald Kief, RN notified.   Baldo Daub MSW, LCSWA CSW Disposition [phone]

## 2016-10-11 NOTE — ED Notes (Signed)
Pt mother, [name], called to report pt posted on Facebook at 2030 last night that he has "lost hope" "has given up" "no one cares". Pt mother would like to be contacted if psychiatric recommendation changes [phone]. Samantha at [facility] aware.

## 2016-10-12 ENCOUNTER — Encounter (HOSPITAL_COMMUNITY): Payer: Self-pay | Admitting: Psychiatry

## 2016-10-12 DIAGNOSIS — R450 Nervousness: Secondary | ICD-10-CM

## 2016-10-12 DIAGNOSIS — R451 Restlessness and agitation: Secondary | ICD-10-CM

## 2016-10-12 DIAGNOSIS — F1994 Other psychoactive substance use, unspecified with psychoactive substance-induced mood disorder: Secondary | ICD-10-CM

## 2016-10-12 DIAGNOSIS — R4586 Emotional lability: Secondary | ICD-10-CM

## 2016-10-12 DIAGNOSIS — F1590 Other stimulant use, unspecified, uncomplicated: Secondary | ICD-10-CM | POA: Clinically undetermined

## 2016-10-12 DIAGNOSIS — G4700 Insomnia, unspecified: Secondary | ICD-10-CM

## 2016-10-12 DIAGNOSIS — F419 Anxiety disorder, unspecified: Secondary | ICD-10-CM

## 2016-10-12 DIAGNOSIS — F319 Bipolar disorder, unspecified: Secondary | ICD-10-CM

## 2016-10-12 DIAGNOSIS — Z9114 Patient's other noncompliance with medication regimen: Secondary | ICD-10-CM

## 2016-10-12 DIAGNOSIS — R454 Irritability and anger: Secondary | ICD-10-CM

## 2016-10-12 MED ORDER — QUETIAPINE FUMARATE 300 MG PO TABS
300.0000 mg | ORAL_TABLET | Freq: Every day | ORAL | Status: DC
  Administered 2016-10-12 – 2016-10-13 (×2): 300 mg via ORAL
  Filled 2016-10-12: qty 7
  Filled 2016-10-12: qty 1
  Filled 2016-10-12: qty 7
  Filled 2016-10-12 (×2): qty 1

## 2016-10-12 MED ORDER — PROPRANOLOL HCL 10 MG PO TABS
10.0000 mg | ORAL_TABLET | Freq: Two times a day (BID) | ORAL | Status: DC
  Administered 2016-10-12 – 2016-10-14 (×4): 10 mg via ORAL
  Filled 2016-10-12: qty 1
  Filled 2016-10-12: qty 14
  Filled 2016-10-12: qty 1
  Filled 2016-10-12: qty 14
  Filled 2016-10-12 (×2): qty 1
  Filled 2016-10-12: qty 14
  Filled 2016-10-12 (×2): qty 1
  Filled 2016-10-12: qty 14

## 2016-10-12 MED ORDER — LORAZEPAM 2 MG/ML IJ SOLN: 1.0000 mg | Freq: Four times a day (QID) | INTRAMUSCULAR | Status: DC | PRN

## 2016-10-12 MED ORDER — LORAZEPAM 1 MG PO TABS: 1.0000 mg | ORAL_TABLET | Freq: Four times a day (QID) | ORAL | Status: DC | PRN

## 2016-10-12 NOTE — [facility] Suicide Risk Assessment (Signed)
[facility] Admission Suicide Risk Assessment   Nursing information obtained from:  Patient Demographic factors:  Male, Caucasian, Unemployed Current Mental Status:  NA Loss Factors:  Loss of significant relationship Historical Factors:  Family history of mental illness or substance abuse Risk Reduction Factors:  Living with another person, especially a relative, Positive social support  Total Time spent with patient: 30 minutes Principal Problem: Substance or medication-induced bipolar and related disorder with onset during intoxication (HCC) Diagnosis:  ( stimulant, cannabis) Patient Active Problem List   Diagnosis Date Noted  . Stimulant use disorder [F15.90] 10/12/2016  . Substance or medication-induced bipolar and related disorder with onset during intoxication (HCC) [F19.94] 07/05/2016  . Opioid use disorder, moderate, dependence (HCC) [F11.20] 07/05/2016  . Cannabis use disorder, severe, dependence (HCC) [F12.20] 07/05/2016  . Overdose [T50.901A] 08/28/2013  . Alcohol intoxication (HCC) [F10.929] 08/28/2013  . Physically aggressive behavior [R46.89] 08/28/2013  . Severely aggressive behavior [R46.89] 08/28/2013  . Marijuana abuse [F12.10] 08/28/2013  . Suicidal ideation [R45.851] 08/28/2013  . Homicidal ideation [R45.850] 08/28/2013  . Insomnia [G47.00] 02/20/2013  . Chronic back pain [M54.9, G89.29] 07/05/2012   Subjective Data: Patient with hx of substance abuse - attempts to minimize abuse , does have past hx, was admitted here in June for similar reasons. Unsure if he were compliant with meds. He states he was, however his depakote level was low - 19. Pt however, currently denies any SI, reports he wants to be started back on medications. Father has dementia and that is a stressor for him since they live together. Has support from girl friend who stays with him.   Continued Clinical Symptoms:  Alcohol Use Disorder Identification Test Final Score (AUDIT): 0 The "Alcohol Use  Disorders Identification Test", Guidelines for Use in Primary Care, Second Edition.  World Science writer [facility]). Score between 0-7:  no or low risk or alcohol related problems. Score between 8-15:  moderate risk of alcohol related problems. Score between 16-19:  high risk of alcohol related problems. Score 20 or above:  warrants further diagnostic evaluation for alcohol dependence and treatment.   CLINICAL FACTORS:   Severe Anxiety and/or Agitation Alcohol/Substance Abuse/Dependencies Previous Psychiatric Diagnoses and Treatments   Musculoskeletal: Strength & Muscle Tone: within normal limits Gait & Station: normal Patient leans: N/A  Psychiatric Specialty Exam: Physical Exam  Review of Systems  Psychiatric/Behavioral: Positive for substance abuse. The patient is nervous/anxious.   All other systems reviewed and are negative.   Blood pressure 103/61, pulse (!) 129, temperature (!) 97.5 F (36.4 C), resp. rate 20, height 5' 7.5" (1.715 m), weight 74.8 kg (165 lb), SpO2 99 %.Body mass index is 25.46 kg/m.  General Appearance: Casual  Eye Contact:  Fair  Speech:  Normal Rate  Volume:  Normal  Mood:  Anxious  Affect:  Congruent  Thought Process:  Goal Directed and Descriptions of Associations: Intact  Orientation:  Full (Time, Place, and Person)  Thought Content:  Rumination  Suicidal Thoughts:  No  Homicidal Thoughts:  No  Memory:  Immediate;   Fair Recent;   Fair Remote;   Fair  Judgement:  Fair  Insight:  Shallow  Psychomotor Activity:  Restlessness  Concentration:  Concentration: Fair and Attention Span: Fair  Recall:  [employer] of Knowledge:  Fair  Language:  Fair  Akathisia:  No  Handed:  Right  AIMS (if indicated):     Assets:  Communication Skills Desire for Improvement  ADL's:  Intact  Cognition:  WNL  Sleep:  Number of Hours: 6.75      COGNITIVE FEATURES THAT CONTRIBUTE TO RISK:  Closed-mindedness, Polarized thinking and Thought constriction  (tunnel vision)    SUICIDE RISK:   Moderate:  Frequent suicidal ideation with limited intensity, and duration, some specificity in terms of plans, no associated intent, good self-control, limited dysphoria/symptomatology, some risk factors present, and identifiable protective factors, including available and accessible social support.  PLAN OF CARE: Discussed restarting Seroquel, Depakote. Pt agrees. Will refer to substance abuse treatment program.  I certify that inpatient services furnished can reasonably be expected to improve the patient's condition.   Jomarie Longs, MD 10/12/2016, 11:55 AM

## 2016-10-12 NOTE — Progress Notes (Signed)
Recreation Therapy Notes  Animal-Assisted Activity (AAA) Program Checklist/Progress Notes Patient Eligibility Criteria Checklist & Daily Group note for Rec Tx Intervention  Date: 09.28.2018 Time: 2:45pm Location: [address]    AAA/T Program Assumption of Risk Form signed by Patient/ or Parent Legal Guardian Yes  Patient is free of allergies or sever asthma Yes  Patient reports no fear of animals Yes  Patient reports no history of cruelty to animals Yes  Patient understands his/her participation is voluntary Yes  Patient washes hands before animal contact Yes  Patient washes hands after animal contact Yes  Behavioral Response: Appropriate    Education: Hand Washing, Appropriate Animal Interaction   Education Outcome: Acknowledges education.   Clinical Observations/Feedback: Patient discussed with MD for appropriateness in pet therapy session. Both LRT and MD agree patient is appropriate for participation. Patient offered participation in session and signed necessary consent form without issue. Patient attended, session, pet therapy dog and primarily observed peer interaction with therapy dog. At approximately 3:15pm patient was asked to leave group by LCSW, patient did not return.   Philip Thompson, LRT/CTRS        Philip Thompson L 10/12/2016 3:14 PM

## 2016-10-12 NOTE — Progress Notes (Signed)
Adult Psychoeducational Group Note  Date:  10/12/2016 Time:  9:31 PM  Group Topic/Focus:  Wrap-Up Group:   The focus of this group is to help patients review their daily goal of treatment and discuss progress on daily workbooks.  Participation Level:  Active  Participation Quality:  Appropriate  Affect:  Appropriate  Cognitive:  Appropriate  Insight: Appropriate  Engagement in Group:  Engaged  Modes of Intervention:  Discussion  Additional Comments:  The patient expressed that he rates today a 10.The patient also said that he attended all groups.  Philip Thompson 10/12/2016, 9:31 PM

## 2016-10-12 NOTE — Progress Notes (Signed)
DAR NOTE: Patient presents with calm affect and pleasant mood.  Denies pain, auditory and visual hallucinations.  Rates depression at 0, hopelessness at 0, and anxiety at 0.  Maintained on routine safety checks.  Medications given as prescribed.  Support and encouragement offered as needed.  Attended group and participated.  Patient observed socializing with peers in the dayroom.  Offered no complaint.

## 2016-10-12 NOTE — Progress Notes (Signed)
  D:Writer observe pt walk to the nurses station several times. When asked if he needed assistance pt informed the writer that he was "wondering about his medications" and also asked the writer for "the paper to write his mother's name down". Stated, his mother needed to tell the nurse about his prescriptions. Writer retrieved the form and pt added his mothers name to the consent.  When asked the circumstances surrounding his adm pt stated, "me and my daddy got into an argument a few days ago and he told my mom some stuff that wasn't true". Pt has no other questions or concerns.    A:  Support and encouragement was offered. 15 min checks continued for safety.  R: Pt remains safe.
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
## 2016-10-12 NOTE — Progress Notes (Signed)
Recreation Therapy Notes  Date: 10/12/16 Time: 1000 Location: [facility] Dayroom  Group Topic: Wellness  Goal Area(s) Addresses:  Patient will define components of whole wellness. Patient will verbalize benefit of whole wellness.  Intervention:  Chairs, 2 small beach balls     Activity: Group Juggle.  Patients were seated in a circle.  Patients were to toss the ball around to each other.  Once patients were able to get the first ball going, LRT would add second ball to the rotation.  Patients were to keep the balls going.  Patients could bounce the balls off the floor but the balls could not stop.  If one of the balls stopped, the person closest to it would throw it back into play.  If both balls stopped, play would start over with one ball.  Education: Wellness, Building control surveyor.   Education Outcome: Acknowledges education/In group clarification offered/Needs additional education.   Clinical Observations/Feedback: Pt did not attend group.   Caroll Rancher, LRT/CTRS         Caroll Rancher A 10/12/2016 11:46 AM
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
## 2016-10-12 NOTE — B[facility] Group Notes (Signed)
B[facility] LCSW Group Therapy  10/12/2016 2:25 PM   Type of Therapy:  Group Therapy  Participation Level:  Active  Participation Quality:  Attentive  Affect:  Appropriate  Cognitive:  Appropriate  Insight:  Improving  Engagement in Therapy:  Engaged  Modes of Intervention:  Clarification, Education, Exploration and Socialization  Summary of Progress/Problems: Today's group focused on resilience.  Stayed the entire time, engaged throughout.  "I'm helping care for my dad who has Alzheimmer's.  He gets really confused, like he was trying to glue wood together with grease but he thought it was glue, and then he gets frustrated and mad and I get frustrated and mad.  But I just remind myself that "It could be worse."  That's what helps me keep gong and not lose my temper."  Philip Thompson 10/12/2016 , 2:25 PM

## 2016-10-13 NOTE — B[facility] Group Notes (Signed)
LCSW Group Therapy Note  10/13/2016 1:15pm  Type of Therapy/Topic:  Group Therapy:  Balance in Life  Participation Level:  Active  Description of Group:    This group will address the concept of balance and how it feels and looks when one is unbalanced. Patients will be encouraged to process areas in their lives that are out of balance and identify reasons for remaining unbalanced. Facilitators will guide patients in utilizing problem-solving interventions to address and correct the stressor making their life unbalanced. Understanding and applying boundaries will be explored and addressed for obtaining and maintaining a balanced life. Patients will be encouraged to explore ways to assertively make their unbalanced needs known to significant others in their lives, using other group members and facilitator for support and feedback.  Therapeutic Goals: 1. Patient will identify two or more emotions or situations they have that consume much of in their lives. 2. Patient will identify signs/triggers that life has become out of balance:  3. Patient will identify two ways to set boundaries in order to achieve balance in their lives:  4. Patient will demonstrate ability to communicate their needs through discussion and/or role plays  Summary of Patient Progress:  Philip Thompson attended group and was there the entire time.  He was an active participant. He feels he is balanced today because he is choosing to be around other people.  He feels that his emotions are even, not up or down. He feels unbalanced when he is lonely.  He attempts to regain balance by getting up and doing something with his hands to keep him busy.  He tries to focus on something different until someone comes back home.    Therapeutic Modalities:   Cognitive Behavioral Therapy Solution-Focused Therapy Assertiveness Training  Aram Beecham, Student-Social Work 10/13/2016 1:24 PM

## 2016-10-13 NOTE — Progress Notes (Signed)
Recreation Therapy Notes  INPATIENT RECREATION THERAPY ASSESSMENT  Patient Details Name: DAYNA EIBEN MRN: [id number] DOB: 05/07/1990 Today's Date: 10/13/2016  Patient Stressors: Family (Dad's health)  Pt stated he was here because he had an argument with his dad and his dad told his mother some things that weren't true.  Coping Skills:   Avoidance, Exercise, Art/Dance, Talking, Music  Personal Challenges: Social Interaction  Leisure Interests (2+):  Crafts - Woodworking, Individual - Other (Comment) (building race engines, Geophysicist/field seismologist)  Awareness of Community Resources:  Yes  Community Resources:  Library, Public affairs consultant, Other (Comment) Stage manager)  Current Use: Yes  Patient Strengths:  Good at working with hands; creative  Patient Identified Areas of Improvement:  Trying to learn with electricity; shop safety  Current Recreation Participation:  Everyday  Patient Goal for Hospitalization:  "Get out as soon as possible"  [place] of Residence:  [place] of Residence:  [place]  Current [place] (including self-harm):  No  Current HI:  No  Consent to Intern Participation: N/A   Caroll Rancher, LRT/CTRS  Caroll Rancher A 10/13/2016, 12:26 PM

## 2016-10-13 NOTE — Progress Notes (Signed)
D: Pt informed the Clinical research associate that his mother visited. Stated, he's being "released in the morning". Informed the Clinical research associate that he plans to go to [facility]. Pt has no questions or concerns.    A:  Support and encouragement was offered. 15 min checks continued for safety.  R: Pt remains safe.

## 2016-10-13 NOTE — Tx Team (Signed)
Interdisciplinary Treatment and Diagnostic Plan Update  10/13/2016 Time of Session: 8:38 AM  Philip Thompson MRN: [id number]  Principal Diagnosis: Substance or medication-induced bipolar and related disorder with onset during intoxication [facility])  Secondary Diagnoses: Principal Problem:   Substance or medication-induced bipolar and related disorder with onset during intoxication ([place]) Active Problems:   Cannabis use disorder, severe, dependence ([place])   Stimulant use disorder   Current Medications:  Current Facility-Administered Medications  Medication Dose Route Frequency Provider Last Rate Last Dose  . acetaminophen (TYLENOL) tablet 650 mg  650 mg Oral Q6H PRN Niel Hummer, NP      . alum & mag hydroxide-simeth (MAALOX/MYLANTA) 200-200-20 MG/5ML suspension 30 mL  30 mL Oral Q4H PRN Elmarie Shiley A, NP      . divalproex (DEPAKOTE) DR tablet 500 mg  500 mg Oral Q12H Elmarie Shiley A, NP   500 mg at 10/13/16 1950  . hydrOXYzine (ATARAX/VISTARIL) tablet 25 mg  25 mg Oral Q6H PRN Niel Hummer, NP   25 mg at 10/12/16 2137  . LORazepam (ATIVAN) tablet 1 mg  1 mg Oral Q6H PRN Ursula Alert, MD       Or  . LORazepam (ATIVAN) injection 1 mg  1 mg Intramuscular Q6H PRN Eappen, Saramma, MD      . magnesium hydroxide (MILK OF MAGNESIA) suspension 30 mL  30 mL Oral Daily PRN Elmarie Shiley A, NP      . ondansetron (ZOFRAN) tablet 4 mg  4 mg Oral Q8H PRN Elmarie Shiley A, NP      . propranolol (INDERAL) tablet 10 mg  10 mg Oral BID Ursula Alert, MD   10 mg at 10/13/16 0829  . QUEtiapine (SEROQUEL) tablet 300 mg  300 mg Oral QHS Eappen, Ria Clock, MD   300 mg at 10/12/16 2136    PTA Medications: Prescriptions Prior to Admission  Medication Sig Dispense Refill Last Dose  . divalproex (DEPAKOTE) 500 MG DR tablet Take 1 tablet (500 mg total) by mouth every 12 (twelve) hours. 60 tablet 0 09/11/2016 at [place]  . hydrOXYzine (ATARAX/VISTARIL) 25 MG tablet Take 1 tablet (25 mg total) by mouth every 6 (six)  hours as needed for anxiety. (Patient taking differently: Take 25 mg by mouth 3 (three) times daily. ) 30 tablet 0 09/11/2016 at Unknown time  . nicotine polacrilex (NICORETTE) 2 MG gum Take 1 each (2 mg total) by mouth as needed for smoking cessation. (Patient not taking: Reported on 09/11/2016) 100 tablet 0 Not Taking at Unknown time  . QUEtiapine (SEROQUEL) 100 MG tablet Take 1 tablet (100 mg total) by mouth at bedtime and may repeat dose one time if needed. (Patient taking differently: Take 300 mg by mouth. ) 30 tablet 0 09/10/2016 at Unknown time    Patient Stressors: Marital or family conflict  Patient Strengths: Ability for insight Average or above average intelligence Communication skills Physical Health Supportive family/friends  Treatment Modalities: Medication Management, Group therapy, Case management,  1 to 1 session with clinician, Psychoeducation, Recreational therapy.   Physician Treatment Plan for Primary Diagnosis: Substance or medication-induced bipolar and related disorder with onset during intoxication ([place]) Long Term Goal(s): Improvement in symptoms so as ready for discharge  Short Term Goals: Ability to identify changes in lifestyle to reduce recurrence of condition will improve Ability to verbalize feelings will improve Ability to demonstrate self-control will improve Ability to identify and develop effective coping behaviors will improve Compliance with prescribed medications will improve Ability to identify triggers  associated with substance abuse/mental health issues will improve  Medication Management: Evaluate patient's response, side effects, and tolerance of medication regimen.  Therapeutic Interventions: 1 to 1 sessions, Unit Group sessions and Medication administration.  Evaluation of Outcomes: Adequate for Discharge  Physician Treatment Plan for Secondary Diagnosis: Principal Problem:   Substance or medication-induced bipolar and related disorder with  onset during intoxication ([place]) Active Problems:   Cannabis use disorder, severe, dependence ([place])   Stimulant use disorder   Long Term Goal(s): Improvement in symptoms so as ready for discharge  Short Term Goals: Ability to identify changes in lifestyle to reduce recurrence of condition will improve Ability to verbalize feelings will improve Ability to demonstrate self-control will improve Ability to identify and develop effective coping behaviors will improve Compliance with prescribed medications will improve Ability to identify triggers associated with substance abuse/mental health issues will improve  Medication Management: Evaluate patient's response, side effects, and tolerance of medication regimen.  Therapeutic Interventions: 1 to 1 sessions, Unit Group sessions and Medication administration.  Evaluation of Outcomes: Adequate for Discharge   RN Treatment Plan for Primary Diagnosis: Substance or medication-induced bipolar and related disorder with onset during intoxication ([place]) Long Term Goal(s): Knowledge of disease and therapeutic regimen to maintain health will improve  Short Term Goals: Ability to identify and develop effective coping behaviors will improve and Compliance with prescribed medications will improve  Medication Management: RN will administer medications as ordered by provider, will assess and evaluate patient's response and provide education to patient for prescribed medication. RN will report any adverse and/or side effects to prescribing provider.  Therapeutic Interventions: 1 on 1 counseling sessions, Psychoeducation, Medication administration, Evaluate responses to treatment, Monitor vital signs and CBGs as ordered, Perform/monitor CIWA, COWS, AIMS and Fall Risk screenings as ordered, Perform wound care treatments as ordered.  Evaluation of Outcomes: Adequate for Discharge    Recreational Therapy Treatment Plan for Primary Diagnosis: Substance or  medication-induced bipolar and related disorder with onset during intoxication ([place]) Long Term Goal(s): Patient will participate in recreation therapy treatment in at least 2 group sessions without prompting from LRT  Short Term Goals: Patient will demonstrate increased ability to follow instructions, as demonstrated by ability to follow LRT instructions on first prompt during recreation therapy group sessions  Treatment Modalities: Group and Pet Therapy  Therapeutic Interventions: Psychoeducation  Evaluation of Outcomes: Progressing   LCSW Treatment Plan for Primary Diagnosis: Substance or medication-induced bipolar and related disorder with onset during intoxication ([place]) Long Term Goal(s): Safe transition to appropriate next level of care at discharge, Engage patient in therapeutic group addressing interpersonal concerns.  Short Term Goals: Engage patient in aftercare planning with referrals and resources  Therapeutic Interventions: Assess for all discharge needs, 1 to 1 time with Social worker, Explore available resources and support systems, Assess for adequacy in community support network, Educate family and significant other(s) on suicide prevention, Complete Psychosocial Assessment, Interpersonal group therapy.  Evaluation of Outcomes: Met Return home, follow up [place] in Treatment: Attending groups: Yes Participating in groups: Yes Taking medication as prescribed: Yes Toleration medication: Yes, no side effects reported at this time Family/Significant other contact made: No  Left message for mother Patient understands diagnosis: No Limited insight Discussing patient identified problems/goals with staff: Yes Medical problems stabilized or resolved: Yes Denies suicidal/homicidal ideation: Yes Issues/concerns per patient self-inventory: None Other: N/A  New problem(s) identified: None identified at this time.   New Short Term/Long Term Goal(s): "I want to  stay on the same medication, and be d/ced as soon as possible."  Discharge Plan or Barriers:   Reason for Continuation of Hospitalization: Mood Instability Medication stabilization   Estimated Length of Stay: Likely d/c tomorrow  Attendees: Patient: Philip Thompson 10/13/2016  8:38 AM  Physician: Ursula Alert, MD 10/13/2016  8:38 AM  Nursing: Sena Hitch, RN 10/13/2016  8:38 AM  RN Care Manager: Lars Pinks, RN 10/13/2016  8:38 AM  Social Worker: Ripley Fraise 10/13/2016  8:38 AM  Recreational Therapist: Victorino Sparrow, LRT/CTRS 10/13/2016  8:38 AM  Other: Norberto Sorenson 10/13/2016  8:38 AM  Other:  10/13/2016  8:38 AM    Scribe for Treatment Team:  Roque Lias LCSW 10/13/2016 8:38 AM

## 2016-10-13 NOTE — Progress Notes (Signed)
D: Patient's self inventory sheet: patient has good sleep, received sleep medication.good  Appetite, normal energy level, good concentration. Rated depression 1/10, hopeless 0/10, anxiety 2/10. SI/HI/AVH: Denies all. Physical complaints are denied. Goal is "after care plan to go home". Plans to work on "talk to doctor".   A: Medications administered, assessed medication knowledge and education given on medication regimen.  Emotional support and encouragement given patient. R: Denies SI and HI , contracts for safety. Safety maintained with 15 minute checks.

## 2016-10-13 NOTE — Progress Notes (Signed)
[facility] MD Progress Note  10/13/2016 12:15 PM Philip Thompson  MRN:  [id number] Subjective:  Patient states " I am OK."  Objective:Patient seen and chart reviewed.Discussed patient with treatment team. Pt today seen as calm , denies any depressive sx today. Pt reports his anxiety is getting better. Pt reports he is tolerating his medications well. Pt denies any ADRs.  Mother - [name] - Research officer, political party. Returned call to mother . Per mother pt was out of control and very depressed and suicidal . Mother thinks he may have not been taking his medications. Mother feels he was acting out and she was afraid for his safety. Mother however , visited him last night and feels he is doing much better and she is OK with him coming home soon.      Principal Problem: Substance or medication-induced bipolar and related disorder with onset during intoxication [facility]) Diagnosis:   Patient Active Problem List   Diagnosis Date Noted  . Stimulant use disorder [F15.90] 10/12/2016  . Substance or medication-induced bipolar and related disorder with onset during intoxication (HCC) [F19.94] 07/05/2016  . Opioid use disorder, moderate, dependence (HCC) [F11.20] 07/05/2016  . Cannabis use disorder, severe, dependence (HCC) [F12.20] 07/05/2016  . Overdose [T50.901A] 08/28/2013  . Alcohol intoxication (HCC) [F10.929] 08/28/2013  . Physically aggressive behavior [R46.89] 08/28/2013  . Severely aggressive behavior [R46.89] 08/28/2013  . Marijuana abuse [F12.10] 08/28/2013  . Suicidal ideation [R45.851] 08/28/2013  . Homicidal ideation [R45.850] 08/28/2013  . Insomnia [G47.00] 02/20/2013  . Chronic back pain [M54.9, G89.29] 07/05/2012   Total Time spent with patient: 30 minutes  Past Psychiatric History: Please see H&P.   Past Medical History:  Past Medical History:  Diagnosis Date  . ADD (attention deficit disorder)   . Allergic rhinitis   . Anxiety   . Arthritis    joint pain  . Frontal head injury      Past Surgical History:  Procedure Laterality Date  . FOOT SURGERY     to get a BB out  . NO PAST SURGERIES     Family History:  Family History  Problem Relation Age of Onset  . Dementia Father   . Alcohol abuse Father    Family Psychiatric  History: Please see H&P.  Social History: Please see H&P.  History  Alcohol Use  . Yes    Comment: hx of. last use 01/2016     History  Drug Use  . Types: Marijuana    Comment: last use last night    Social History   Social History  . Marital status: Single    Spouse name: N/A  . Number of children: N/A  . Years of education: N/A   Social History Main Topics  . Smoking status: Current Every Day Smoker    Packs/day: 1.00    Years: 7.00    Types: Cigarettes  . Smokeless tobacco: Never Used  . Alcohol use Yes     Comment: hx of. last use 01/2016  . Drug use: Yes    Types: Marijuana     Comment: last use last night  . Sexual activity: Yes    Birth control/ protection: Condom   Other Topics Concern  . None   Social History Narrative  . None   Additional Social History:                         Sleep: Fair  Appetite:  Fair  Current Medications: Current Facility-Administered  Medications  Medication Dose Route Frequency Provider Last Rate Last Dose  . acetaminophen (TYLENOL) tablet 650 mg  650 mg Oral Q6H PRN Thermon Leyland, NP      . alum & mag hydroxide-simeth (MAALOX/MYLANTA) 200-200-20 MG/5ML suspension 30 mL  30 mL Oral Q4H PRN Fransisca Kaufmann A, NP      . divalproex (DEPAKOTE) DR tablet 500 mg  500 mg Oral Q12H Fransisca Kaufmann A, NP   500 mg at 10/13/16 4098  . hydrOXYzine (ATARAX/VISTARIL) tablet 25 mg  25 mg Oral Q6H PRN Thermon Leyland, NP   25 mg at 10/12/16 2137  . LORazepam (ATIVAN) tablet 1 mg  1 mg Oral Q6H PRN Jomarie Longs, MD       Or  . LORazepam (ATIVAN) injection 1 mg  1 mg Intramuscular Q6H PRN Wiktoria Hemrick, MD      . magnesium hydroxide (MILK OF MAGNESIA) suspension 30 mL  30 mL Oral Daily  PRN Fransisca Kaufmann A, NP      . ondansetron (ZOFRAN) tablet 4 mg  4 mg Oral Q8H PRN Fransisca Kaufmann A, NP      . propranolol (INDERAL) tablet 10 mg  10 mg Oral BID Jomarie Longs, MD   10 mg at 10/13/16 0829  . QUEtiapine (SEROQUEL) tablet 300 mg  300 mg Oral QHS Allaina Brotzman, Levin Bacon, MD   300 mg at 10/12/16 2136    Lab Results: No results found for this or any previous visit (from the past 48 hour(s)).  Blood Alcohol level:  Lab Results  Component Value Date   ETH <5 10/10/2016   ETH <5 09/11/2016    Metabolic Disorder Labs: Lab Results  Component Value Date   HGBA1C 5.4 07/02/2016   MPG 108 07/02/2016   Lab Results  Component Value Date   PROLACTIN 11.4 07/02/2016   Lab Results  Component Value Date   CHOL 124 07/02/2016   TRIG 69 07/02/2016   HDL 33 (L) 07/02/2016   CHOLHDL 3.8 07/02/2016   VLDL 14 07/02/2016   LDLCALC 77 07/02/2016    Physical Findings: AIMS: Facial and Oral Movements Muscles of Facial Expression: None, normal Lips and Perioral Area: None, normal Jaw: None, normal Tongue: None, normal,Extremity Movements Upper (arms, wrists, hands, fingers): None, normal Lower (legs, knees, ankles, toes): None, normal, Trunk Movements Neck, shoulders, hips: None, normal, Overall Severity Severity of abnormal movements (highest score from questions above): None, normal Incapacitation due to abnormal movements: None, normal Patient's awareness of abnormal movements (rate only patient's report): No Awareness, Dental Status Current problems with teeth and/or dentures?: No Does patient usually wear dentures?: No  CIWA:    COWS:     Musculoskeletal: Strength & Muscle Tone: within normal limits Gait & Station: normal Patient leans: N/A  Psychiatric Specialty Exam: Physical Exam  Nursing note and vitals reviewed.   Review of Systems  Psychiatric/Behavioral: Positive for substance abuse. The patient is nervous/anxious.   All other systems reviewed and are negative.    Blood pressure 102/63, pulse 91, temperature (!) 97.4 F (36.3 C), temperature source Oral, resp. rate 16, height 5' 7.5" (1.715 m), weight 74.8 kg (165 lb), SpO2 99 %.Body mass index is 25.46 kg/m.  General Appearance: Casual  Eye Contact:  Fair  Speech:  Clear and Coherent  Volume:  Decreased  Mood:  Anxious improving  Affect:  Congruent  Thought Process:  Goal Directed and Descriptions of Associations: Circumstantial  Orientation:  Full (Time, Place, and Person)  Thought Content:  Rumination,  improving  Suicidal Thoughts:  No  Homicidal Thoughts:  No  Memory:  Immediate;   Fair Recent;   Fair Remote;   Fair  Judgement:  Fair  Insight:  Fair  Psychomotor Activity:  Normal  Concentration:  Concentration: Fair and Attention Span: Fair  Recall:  [employer] of Knowledge:  Fair  Language:  Fair  Akathisia:  No  Handed:  Right  AIMS (if indicated):     Assets:  Desire for Improvement  ADL's:  Intact  Cognition:  WNL  Sleep:  Number of Hours: 6.75     Treatment Plan Summary:Patient presented with aggression and paranoid , likely substance induced. Pt currently making progress. Mother as documented above - is OK with taking her son home soon. Possible discharge tomorrow.  Daily contact with patient to assess and evaluate symptoms and progress in treatment, Medication management and Plan see below   Depakote DR 500 mg po bid for mood sx. Depakote level - 10/15/2016. Seroquel 300 mg po qhs for psychosis. Propranolol 10 mg po bid for tachycardia. Refer to substance abuse treatment program. EKG reviewed - qtc - wnl. CSW will continue to work on disposition.  Jomarie Longs, MD 10/13/2016, 12:15 PM

## 2016-10-13 NOTE — Progress Notes (Signed)
Adult Psychoeducational Group Note  Date:  10/13/2016 Time:  8:56 PM  Group Topic/Focus:  Wrap-Up Group:   The focus of this group is to help patients review their daily goal of treatment and discuss progress on daily workbooks.  Participation Level:  Active  Participation Quality:  Appropriate  Affect:  Appropriate  Cognitive:  Appropriate  Insight: Appropriate  Engagement in Group:  Engaged  Modes of Intervention:  Discussion  Additional Comments:  The patient expressed that he attended group and rates today 9.The patient also said that he reach his goals.  Octavio Manns 10/13/2016, 8:56 PM

## 2016-10-13 NOTE — [facility] Suicide Risk Assessment (Signed)
[facility] INPATIENT:  Family/Significant Other Suicide Prevention Education  Suicide Prevention Education:  Contact Attempts: [name], mother, [phone] has been identified by the patient as the family member/significant other with whom the patient will be residing, and identified as the person(s) who will aid the patient in the event of a mental health crisis.  With written consent from the patient, two attempts were made to provide suicide prevention education, prior to and/or following the patient's discharge.  We were unsuccessful in providing suicide prevention education.  A suicide education pamphlet was given to the patient to share with family/significant other.  Date and time of first attempt:10/13/2016 9:58 AM  Date and time of second attempt:10/13/2016 4:46 PM   Ida Rogue 10/13/2016, 9:57 AM

## 2016-10-14 ENCOUNTER — Encounter (HOSPITAL_COMMUNITY): Payer: Self-pay | Admitting: *Deleted

## 2016-10-14 ENCOUNTER — Other Ambulatory Visit: Payer: Self-pay

## 2016-10-14 ENCOUNTER — Emergency Department (HOSPITAL_COMMUNITY)
Admission: EM | Admit: 2016-10-14 | Discharge: 2016-10-15 | Disposition: A | Discharge status: Home / Self Care | Payer: Self-pay | Attending: Emergency Medicine | Admitting: Emergency Medicine

## 2016-10-14 DIAGNOSIS — Z811 Family history of alcohol abuse and dependence: Secondary | ICD-10-CM

## 2016-10-14 DIAGNOSIS — F17210 Nicotine dependence, cigarettes, uncomplicated: Secondary | ICD-10-CM

## 2016-10-14 DIAGNOSIS — R440 Auditory hallucinations: Secondary | ICD-10-CM

## 2016-10-14 DIAGNOSIS — F1590 Other stimulant use, unspecified, uncomplicated: Secondary | ICD-10-CM

## 2016-10-14 DIAGNOSIS — F1290 Cannabis use, unspecified, uncomplicated: Secondary | ICD-10-CM

## 2016-10-14 DIAGNOSIS — Z6379 Other stressful life events affecting family and household: Secondary | ICD-10-CM

## 2016-10-14 DIAGNOSIS — F1910 Other psychoactive substance abuse, uncomplicated: Secondary | ICD-10-CM

## 2016-10-14 DIAGNOSIS — R4587 Impulsiveness: Secondary | ICD-10-CM | POA: Insufficient documentation

## 2016-10-14 DIAGNOSIS — F1994 Other psychoactive substance use, unspecified with psychoactive substance-induced mood disorder: Secondary | ICD-10-CM | POA: Diagnosis present

## 2016-10-14 DIAGNOSIS — Z810 Family history of intellectual disabilities: Secondary | ICD-10-CM

## 2016-10-14 DIAGNOSIS — T40604A Poisoning by unspecified narcotics, undetermined, initial encounter: Secondary | ICD-10-CM | POA: Insufficient documentation

## 2016-10-14 HISTORY — DX: Poisoning by heroin, accidental (unintentional), initial encounter: T40.1X1A

## 2016-10-14 LAB — COMPREHENSIVE METABOLIC PANEL
ALT: 12 U/L — ABNORMAL LOW (ref 17–63)
AST: 13 U/L — ABNORMAL LOW (ref 15–41)
Albumin: 4.6 g/dL (ref 3.5–5.0)
Alkaline Phosphatase: 60 U/L (ref 38–126)
Anion gap: 8 (ref 5–15)
BUN: 17 mg/dL (ref 6–20)
CO2: 25 mmol/L (ref 22–32)
Calcium: 9.9 mg/dL (ref 8.9–10.3)
Chloride: 106 mmol/L (ref 101–111)
Creatinine, Ser: 0.94 mg/dL (ref 0.61–1.24)
GFR calc Af Amer: >60 mL/min (ref >60)
GFR calc non Af Amer: >60 mL/min (ref >60)
Glucose, Bld: 102 mg/dL — ABNORMAL HIGH (ref 65–99)
Potassium: 4.2 mmol/L (ref 3.5–5.1)
Sodium: 139 mmol/L (ref 135–145)
Total Bilirubin: 0.3 mg/dL (ref 0.3–1.2)
Total Protein: 7.8 g/dL (ref 6.5–8.1)

## 2016-10-14 LAB — CBC WITH DIFFERENTIAL/PLATELET
Basophils Absolute: 0 10*3/uL (ref 0.0–0.1)
Basophils Relative: 0 %
Eosinophils Absolute: 0.2 10*3/uL (ref 0.0–0.7)
Eosinophils Relative: 1 %
HCT: 44 % (ref 39.0–52.0)
Hemoglobin: 14.8 g/dL (ref 13.0–17.0)
Lymphocytes Relative: 12 %
Lymphs Abs: 2.1 10*3/uL (ref 0.7–4.0)
MCH: 28.5 pg (ref 26.0–34.0)
MCHC: 33.6 g/dL (ref 30.0–36.0)
MCV: 84.6 fL (ref 78.0–100.0)
Monocytes Absolute: 1.6 10*3/uL — ABNORMAL HIGH (ref 0.1–1.0)
Monocytes Relative: 9 %
Neutro Abs: 13.4 10*3/uL — ABNORMAL HIGH (ref 1.7–7.7)
Neutrophils Relative %: 78 %
Platelets: 158 10*3/uL (ref 150–400)
RBC: 5.2 MIL/uL (ref 4.22–5.81)
RDW: 13.9 % (ref 11.5–15.5)
WBC: 17.3 10*3/uL — ABNORMAL HIGH (ref 4.0–10.5)

## 2016-10-14 LAB — ETHANOL: Alcohol, Ethyl (B): <10 mg/dL (ref <10)

## 2016-10-14 MED ORDER — NALOXONE HCL 2 MG/2ML IJ SOSY
2.0000 mg | PREFILLED_SYRINGE | Freq: Once | INTRAMUSCULAR | Status: AC
  Administered 2016-10-14: 2 mg via INTRAVENOUS

## 2016-10-14 MED ORDER — HYDROXYZINE HCL 25 MG PO TABS: 25.0000 mg | ORAL_TABLET | Freq: Four times a day (QID) | ORAL | 0 refills | Status: DC | PRN

## 2016-10-14 MED ORDER — QUETIAPINE FUMARATE 300 MG PO TABS: 300.0000 mg | ORAL_TABLET | Freq: Every day | ORAL | 0 refills | Status: DC

## 2016-10-14 MED ORDER — DIVALPROEX SODIUM 500 MG PO DR TAB: 500.0000 mg | DELAYED_RELEASE_TABLET | Freq: Two times a day (BID) | ORAL | 0 refills | Status: DC

## 2016-10-14 MED ORDER — NALOXONE HCL 2 MG/2ML IJ SOSY
PREFILLED_SYRINGE | INTRAMUSCULAR | Status: AC
  Administered 2016-10-14: 2 mg via INTRAVENOUS
  Filled 2016-10-14: qty 2

## 2016-10-14 MED ORDER — PROPRANOLOL HCL 10 MG PO TABS: 10.0000 mg | ORAL_TABLET | Freq: Two times a day (BID) | ORAL | 0 refills | Status: DC

## 2016-10-14 NOTE — ED Notes (Signed)
Pt has clothes and lighter. Placed in locked locker.

## 2016-10-14 NOTE — [facility] Suicide Risk Assessment (Signed)
[facility] Discharge Suicide Risk Assessment   Principal Problem: Substance or medication-induced bipolar and related disorder with onset during intoxication [facility]) Discharge Diagnoses:  Patient Active Problem List   Diagnosis Date Noted  . Stimulant use disorder [F15.90] 10/12/2016  . Substance or medication-induced bipolar and related disorder with onset during intoxication (HCC) [F19.94] 07/05/2016  . Opioid use disorder, moderate, dependence (HCC) [F11.20] 07/05/2016  . Cannabis use disorder, severe, dependence (HCC) [F12.20] 07/05/2016  . Overdose [T50.901A] 08/28/2013  . Alcohol intoxication (HCC) [F10.929] 08/28/2013  . Physically aggressive behavior [R46.89] 08/28/2013  . Severely aggressive behavior [R46.89] 08/28/2013  . Marijuana abuse [F12.10] 08/28/2013  . Suicidal ideation [R45.851] 08/28/2013  . Homicidal ideation [R45.850] 08/28/2013  . Insomnia [G47.00] 02/20/2013  . Chronic back pain [M54.9, G89.29] 07/05/2012    Total Time spent with patient: 30 minutes  Musculoskeletal: Strength & Muscle Tone: within normal limits Gait & Station: normal Patient leans: N/A  Psychiatric Specialty Exam: ROS denies headache, no chest pain, no dyspnea, no nausea, no vomiting   Blood pressure 93/64, pulse 82, temperature 97.7 F (36.5 C), resp. rate 16, height 5' 7.5" (1.715 m), weight 74.8 kg (165 lb), SpO2 99 %.Body mass index is 25.46 kg/m.  General Appearance: Fairly Groomed  Patent attorney::  Good  Speech:  Normal Rate409  Volume:  Normal  Mood:  denies depression, states " my mood is more upbeat today"  Affect:  Appropriate  Thought Process:  Linear and Descriptions of Associations: Intact  Orientation:  Other:  fully alert and attentive   Thought Content:  denies hallucinations, no delusions, not internally preoccupied   Suicidal Thoughts:  No- denies any suicidal or self injurious ideations   Homicidal Thoughts:  No- denies any violent or homicidal ideations towards anyone   Memory:  recent and remote grossly intact   Judgement:  Other:  improving   Insight:  improving   Psychomotor Activity:  Normal  Concentration:  Good  Recall:  Good  Fund of Knowledge:Good  Language: Good  Akathisia:  Negative  Handed:  Right  AIMS (if indicated):   no abnormal or involuntary movements noted or reported   Assets:  Communication Skills Desire for Improvement Resilience  Sleep:  Number of Hours: 6.75  Cognition: WNL  ADL's:  Intact   Mental Status Per Nursing Assessment::   On Admission:  NA  Demographic Factors:  26 year old single male , lives with father, unemployed   Loss Factors: Argument with father, who has dementia  Historical Factors: History of bipolar disorder , episodes of depression, has history of suicidal attempt in the past . Cannabis Abuse  Risk Reduction Factors:   Sense of responsibility to family, Living with another person, especially a relative and Positive coping skills or problem solving skills  Continued Clinical Symptoms:  At this time patient is alert, attentive, calm, polite, no psychomotor agitation, reports mood is "OK", denies depression, affect is appropriate, slightly anxious, not irritable or expansive, no thought disorder, no suicidal or self injurious ideations, no homicidal or violent ideations, no hallucinations, no delusions expressed. With patient's consent I spoke with his mother via phone, who has been visiting . She corroborates patient is doing better, is at baseline and she agrees that he is ready for discharge, she will pick him up later today . Denies medication side effects, we discussed medication side effects. Cognitive Features That Contribute To Risk:  No gross cognitive deficits noted upon discharge. Is alert , attentive, and oriented x 3  Suicide Risk:  Mild:  Suicidal ideation of limited frequency, intensity, duration, and specificity.  There are no identifiable plans, no associated intent, mild  dysphoria and related symptoms, good self-control (both objective and subjective assessment), few other risk factors, and identifiable protective factors, including available and accessible social support.  Follow-up Information    [facility] Follow up on 10/19/2016.   Why:  Tuesday at 1:00 with Zannie Kehr.  Then October 23 at 2;30 for medication management. Contact information: [address] [place] [address] [phone]           Plan Of Care/Follow-up recommendations:  Activity:  as tolerated  Diet:  Regular  Tests:  NA Other:  NA Patient reports readiness for discharge, no grounds for ongoing involuntary commitment at this time Leaving unit in good spirits, plans to return home, mother will pick him up later today Craige Cotta, MD 10/14/2016, 10:58 AM

## 2016-10-14 NOTE — Progress Notes (Signed)
Recreation Therapy Notes  Date: 10/14/16 Time: 1000 Location: 500 Hall Dayroom  Group Topic: Self-Esteem  Goal Area(s) Addresses:  Patient will successfully identify positive attributes about themselves.  Patient will successfully identify benefit of improved self-esteem.   Behavioral Response: Engaged  Intervention: Worksheets, Freight forwarder  Activity: My Conservator, museum/gallery.  Patients were given a worksheet that was broken down into eight areas (things I'm good at, appearance, helped others, most value, compliments I received, challenges I've overcome, what makes you unique and times I've made others happy).  Patients were to list 3 things for each section.  Education:  Self-Esteem, Building control surveyor.   Education Outcome: Acknowledges education/In group clarification offered/Needs additional education  Clinical Observations/Feedback: Pt expressed he was good at building things, woodworking; what he likes about his appearance is his hair color and he's smaller than he was not to long ago; helped others by telling them their self worth and teaching them what he knows; overcame falling off a bridge; and what makes him unique is graduating from high school at age 26.   Caroll Rancher, LRT/CTRS         Caroll Rancher A 10/14/2016 12:25 PM

## 2016-10-14 NOTE — Progress Notes (Signed)
  [facility] Adult Case Management Discharge Plan :  Will you be returning to the same living situation after discharge:  Yes,  home At discharge, do you have transportation home?: Yes,  mother Do you have the ability to pay for your medications: Yes,  mental health  Release of information consent forms completed and in the chart;  Patient's signature needed at discharge.  Patient to Follow up at: Follow-up Information    [facility] Follow up on 10/19/2016.   Why:  Tuesday at 1:00 with Zannie Kehr.  Then October 23 at 2;30 for medication management. Contact information: [address] [place] [address] [phone]           Next level of care provider has access to [facility] Link:no  Safety Planning and Suicide Prevention discussed: Yes,  yes  Have you used any form of tobacco in the last 30 days? (Cigarettes, Smokeless Tobacco, Cigars, and/or Pipes): Yes  Has patient been referred to the Quitline?: Patient refused referral  Patient has been referred for addiction treatment: Pt. refused referral  Ida Rogue, LCSW 10/14/2016, 10:25 AM
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
## 2016-10-14 NOTE — ED Triage Notes (Signed)
Pt in via [facility] with IVC papers taken out by his mother. Pt very lethargic, pale, with shallow respirations. Pt denies SI/HI and states he took 2-oxycontin today.Pt given narcan and now pt is sitting up taking to the doctor.

## 2016-10-14 NOTE — Discharge Summary (Signed)
Physician Discharge Summary Note  Patient:  Philip Thompson is an 26 y.o., male MRN:  [id number] DOB:  03/12/1990 Patient phone:  [phone] (home)  Patient address:   [address] [place] [address],  Total Time spent with patient: Greater than 30 minutes.  Date of Admission:  10/11/2016  Date of Discharge: 10/14/2016  Reason for Admission: Drug intoxication triggering mood instability.  Principal Problem: Substance or medication-induced bipolar and related disorder with onset during intoxication [facility]  Discharge Diagnoses: Patient Active Problem List   Diagnosis Date Noted  . Stimulant use disorder [F15.90] 10/12/2016  . Substance or medication-induced bipolar and related disorder with onset during intoxication (HCC) [F19.94] 07/05/2016  . Opioid use disorder, moderate, dependence (HCC) [F11.20] 07/05/2016  . Cannabis use disorder, severe, dependence (HCC) [F12.20] 07/05/2016  . Overdose [T50.901A] 08/28/2013  . Alcohol intoxication (HCC) [F10.929] 08/28/2013  . Physically aggressive behavior [R46.89] 08/28/2013  . Severely aggressive behavior [R46.89] 08/28/2013  . Marijuana abuse [F12.10] 08/28/2013  . Suicidal ideation [R45.851] 08/28/2013  . Homicidal ideation [R45.850] 08/28/2013  . Insomnia [G47.00] 02/20/2013  . Chronic back pain [M54.9, G89.29] 07/05/2012   Past Psychiatric History: Stimulant & THC use disorder, Substance induced mood disorder.  Past Medical History:  Past Medical History:  Diagnosis Date  . ADD (attention deficit disorder)   . Allergic rhinitis   . Anxiety   . Arthritis    joint pain  . Frontal head injury     Past Surgical History:  Procedure Laterality Date  . FOOT SURGERY     to get a BB out  . NO PAST SURGERIES     Family History:  Family History  Problem Relation Age of Onset  . Dementia Father   . Alcohol abuse Father    Family Psychiatric  History: See H&P  Social History:  History  Alcohol Use  . Yes   Comment: hx of. last use 01/2016     History  Drug Use  . Types: Marijuana    Comment: last use last night    Social History   Social History  . Marital status: Single    Spouse name: N/A  . Number of children: N/A  . Years of education: N/A   Social History Main Topics  . Smoking status: Current Every Day Smoker    Packs/day: 1.00    Years: 7.00    Types: Cigarettes  . Smokeless tobacco: Never Used  . Alcohol use Yes     Comment: hx of. last use 01/2016  . Drug use: Yes    Types: Marijuana     Comment: last use last night  . Sexual activity: Yes    Birth control/ protection: Condom   Other Topics Concern  . None   Social History Narrative  . None   Hospital Course: This is an admission for Tavita, a 26 year old Caucasian male with hx of mental illness. He is known in this [facility] adult unit from previous hospitalization for mood stabilization treatments. He was discharged from this hospital on 07-05-16 with referral to follow-up care on outpatient basis at the [facility]. Tami is also with hx of substance abuse issues.  During this assessment, Pantelis attempts to minimize the substance abuse, even though his UDS was positive for Amphetamine & THC. He does have past hx, was admitted here in June for similar reasons. Unsure if he was compliant with the recommended discharge medications. He states he was compliant, however his depakote level was  below the therapeutic range at26. Patient currently denies any SIHI. He reports he wants to be started back on those previous medications. He reports that his father has dementia and that is a stressor for him since they live together. Has support from girl friend who stays with him.  After evaluation of his presenting symptoms, Yazir was medicated & discharged on; Depakote DR 500 mg for mood stabilization, Hydroxyzine 25 prn mg for anxiety, Propranolol  10 mg for anxiety & Seroquel 300 mg for mood control. He was also enrolled &  participated in the group counseling sessions being offered & held on this unit. He learned coping skills. He presented no other significant pre-existing medical problems that required treatment or monitoring. He tolerated his treatment regimen without any adverse effects reported.  As Dimetrius's treatment progressed, Improvement was monitored & noted by observation of his daily reports of symptom reduction. His emotional & mental status were also monitored by daily self-inventory assessment reports completed by him & the clinical staff. He was evaluated daily by the treatment team for stability and plans for continued recovery after discharge. He was recommended further treatment options upon discharge by referring & scheduling him an outpatient psychiatric clinic for follow-up visits & medication managment as listed below.     Upon discharge, Gust was both mentally and medically stable. He denies SIHI, auditory/visual/tactile hallucinations, delusional thoughts & or paranoia. He was provided with a 7 days worth supply samples of his [facility] discharge medications. He left [facility] in no apparent distress. Transportation per mother.  Physical Findings: AIMS: Facial and Oral Movements Muscles of Facial Expression: None, normal Lips and Perioral Area: None, normal Jaw: None, normal Tongue: None, normal,Extremity Movements Upper (arms, wrists, hands, fingers): None, normal Lower (legs, knees, ankles, toes): None, normal, Trunk Movements Neck, shoulders, hips: None, normal, Overall Severity Severity of abnormal movements (highest score from questions above): None, normal Incapacitation due to abnormal movements: None, normal Patient's awareness of abnormal movements (rate only patient's report): No Awareness, Dental Status Current problems with teeth and/or dentures?: No Does patient usually wear dentures?: No  CIWA:    COWS:     Musculoskeletal: Strength & Muscle Tone: within normal limits Gait & Station:  normal Patient leans: N/A  Psychiatric Specialty Exam: See SRA by MD Physical Exam  Nursing note and vitals reviewed. Constitutional: He appears well-developed.  HENT:  Head: Normocephalic.  Eyes: Pupils are equal, round, and reactive to light.  Cardiovascular: Normal rate.   Respiratory: Effort normal.  GI: Soft.  Genitourinary:  Genitourinary Comments: Deferred  Musculoskeletal: Normal range of motion.  Neurological: He is alert.  Skin: Skin is warm.  Psychiatric: He has a normal mood and affect. His behavior is normal.    Review of Systems  Constitutional: Negative.   HENT: Negative.   Eyes: Negative.   Respiratory: Negative.   Cardiovascular: Negative.   Gastrointestinal: Negative.   Genitourinary: Negative.   Musculoskeletal: Negative.   Skin: Negative.   Neurological: Negative.   Endo/Heme/Allergies: Negative.   Psychiatric/Behavioral: Positive for depression (Stabilized with medication prior to discharge), hallucinations (Hx. psychosis: Stabilized with medication prior to discharge) and substance abuse (Hx. Amphetamine & THC use disorder). Negative for memory loss and suicidal ideas. The patient has insomnia (Stabilized with medication prior to discharge). The patient is not nervous/anxious.     Blood pressure 93/64, pulse 82, temperature 97.7 F (36.5 C), resp. rate 16, height 5' 7.5" (1.715 m), weight 74.8 kg (165 lb), SpO2 99 %.Body mass index is  25.46 kg/m.   Have you used any form of tobacco in the last 30 days? (Cigarettes, Smokeless Tobacco, Cigars, and/or Pipes): Yes  Has this patient used any form of tobacco in the last 30 days? (Cigarettes, Smokeless Tobacco, Cigars, and/or Pipes): No  Blood Alcohol level:  Lab Results  Component Value Date   [facility] <5 10/10/2016   ETH <5 09/11/2016   Metabolic Disorder Labs:  Lab Results  Component Value Date   HGBA1C 5.4 07/02/2016   MPG 108 07/02/2016   Lab Results  Component Value Date   PROLACTIN 11.4  07/02/2016   Lab Results  Component Value Date   CHOL 124 07/02/2016   TRIG 69 07/02/2016   HDL 33 (L) 07/02/2016   CHOLHDL 3.8 07/02/2016   VLDL 14 07/02/2016   LDLCALC 77 07/02/2016   See Psychiatric Specialty Exam and Suicide Risk Assessment completed by Attending Physician prior to discharge.  Discharge destination:  Home  Is patient on multiple antipsychotic therapies at discharge:  No   Has Patient had three or more failed trials of antipsychotic monotherapy by history:  No  Recommended Plan for Multiple Antipsychotic Therapies: NA  Allergies as of 10/14/2016   No Known Allergies     Medication List    STOP taking these medications   nicotine polacrilex 2 MG gum Commonly known as:  NICORETTE     TAKE these medications     Indication  divalproex 500 MG DR tablet Commonly known as:  DEPAKOTE Take 1 tablet (500 mg total) by mouth every 12 (twelve) hours. For mood stabilization What changed:  additional instructions  Indication:  Mood stabilization   hydrOXYzine 25 MG tablet Commonly known as:  ATARAX/VISTARIL Take 1 tablet (25 mg total) by mouth every 6 (six) hours as needed for anxiety. What changed:  when to take this  reasons to take this  Indication:  Feeling Anxious   propranolol 10 MG tablet Commonly known as:  INDERAL Take 1 tablet (10 mg total) by mouth 2 (two) times daily. For anxiety  Indication:  Anxiety   QUEtiapine 300 MG tablet Commonly known as:  SEROQUEL Take 1 tablet (300 mg total) by mouth at bedtime. For mood control What changed:  medication strength  how much to take  when to take this  additional instructions  Indication:  Mood control      Follow-up Information    [facility] up on 10/19/2016.   Why:  Tuesday at 1:00 with Zannie Kehr.  Then October 23 at 2;30 for medication management. Contact information: [address] [place] [address] [phone]          Follow-up recommendations: Activity:   As tolerated Diet: As recommended by your primary care doctor. Keep all scheduled follow-up appointments as recommended.   Comments: Patient is instructed prior to discharge to: Take all medications as prescribed by his/her mental healthcare provider. Report any adverse effects and or reactions from the medicines to his/her outpatient provider promptly. Patient has been instructed & cautioned: To not engage in alcohol and or illegal drug use while on prescription medicines. In the event of worsening symptoms, patient is instructed to call the crisis hotline, 911 and or go to the nearest ED for appropriate evaluation and treatment of symptoms. To follow-up with his/her primary care provider for your other medical issues, concerns and or health care needs.     Sanjuana Kava, NP, PMHNP, FNP-BC 10/14/2016, 11:29 AM

## 2016-10-14 NOTE — ED Notes (Addendum)
Pt becoming aggitated and cursing at officer. Pt placed in handcuff to right wrist and bed. Pt stating he will "take matters into his own hands."

## 2016-10-14 NOTE — Progress Notes (Signed)
Recreation Therapy Notes  INPATIENT RECREATION TR PLAN  Patient Details Name: Philip Thompson MRN: [id number] DOB: [birth year] Today's Date: 10/14/2016  Rec Therapy Plan Is patient appropriate for Therapeutic Recreation?: Yes Treatment times per week: about 3 days Estimated Length of Stay: 5-7 days TR Treatment/Interventions: Group participation (Comment)  Discharge Criteria Pt will be discharged from therapy if:: Discharged Treatment plan/goals/alternatives discussed and agreed upon by:: Patient/family  Discharge Summary Short term goals set: Patient will attend and participate in Recreation Therapy Group Sessions  Short term goals met: Adequate for discharge Progress toward goals comments: Groups attended Which groups?: Self-esteem Reason goals not met: None Therapeutic equipment acquired: N/A Reason patient discharged from therapy: Discharge from hospital Pt/family agrees with progress & goals achieved: Yes Date patient discharged from therapy: 10/14/16   Victorino Sparrow, LRT/CTRS  Ria Comment, Yecheskel Kurek A 10/14/2016, 12:32 PM

## 2016-10-14 NOTE — Plan of Care (Signed)
Problem: [facility] Participation in Recreation Therapeutic Interventions Goal: STG-Patient will attend/participate in Rec Therapy Group Ses STG-The Patient will attend and participate in Recreation Therapy Group Sessions  Outcome: Adequate for Discharge Pt attended and participated in self esteem recreation therapy group session.  Caroll Rancher, LRT/CTRS

## 2016-10-14 NOTE — ED Notes (Signed)
Pts belongings locked in locker, pt changed into scrubs, pt states he does not need to urinate at this moment

## 2016-10-14 NOTE — Progress Notes (Signed)
Pt discharged to lobby. Pt was stable and appreciative at that time. All papers, samples and prescriptions were given and valuables returned. Verbal understanding expressed. Denies SI/HI and A/VH. Pt given opportunity to express concerns and ask questions.  

## 2016-10-15 ENCOUNTER — Encounter (HOSPITAL_COMMUNITY): Payer: Self-pay | Admitting: Emergency Medicine

## 2016-10-15 DIAGNOSIS — F17210 Nicotine dependence, cigarettes, uncomplicated: Secondary | ICD-10-CM

## 2016-10-15 DIAGNOSIS — F1590 Other stimulant use, unspecified, uncomplicated: Secondary | ICD-10-CM

## 2016-10-15 DIAGNOSIS — T43624A Poisoning by amphetamines, undetermined, initial encounter: Secondary | ICD-10-CM

## 2016-10-15 DIAGNOSIS — Z811 Family history of alcohol abuse and dependence: Secondary | ICD-10-CM

## 2016-10-15 DIAGNOSIS — F1910 Other psychoactive substance abuse, uncomplicated: Secondary | ICD-10-CM

## 2016-10-15 DIAGNOSIS — T424X4A Poisoning by benzodiazepines, undetermined, initial encounter: Secondary | ICD-10-CM

## 2016-10-15 DIAGNOSIS — R450 Nervousness: Secondary | ICD-10-CM

## 2016-10-15 DIAGNOSIS — F419 Anxiety disorder, unspecified: Secondary | ICD-10-CM

## 2016-10-15 DIAGNOSIS — R4587 Impulsiveness: Secondary | ICD-10-CM | POA: Insufficient documentation

## 2016-10-15 DIAGNOSIS — Z810 Family history of intellectual disabilities: Secondary | ICD-10-CM

## 2016-10-15 DIAGNOSIS — F1994 Other psychoactive substance use, unspecified with psychoactive substance-induced mood disorder: Secondary | ICD-10-CM

## 2016-10-15 LAB — RAPID URINE DRUG SCREEN, HOSP PERFORMED
Amphetamines: POSITIVE — AB
Barbiturates: NOT DETECTED
Benzodiazepines: POSITIVE — AB
Cocaine: NOT DETECTED
Opiates: POSITIVE — AB
Tetrahydrocannabinol: POSITIVE — AB

## 2016-10-15 LAB — ACETAMINOPHEN LEVEL: Acetaminophen (Tylenol), Serum: <10 ug/mL — ABNORMAL LOW (ref 10–30)

## 2016-10-15 LAB — SALICYLATE LEVEL: Salicylate Lvl: <7 mg/dL (ref 2.8–30.0)

## 2016-10-15 MED ORDER — IBUPROFEN 400 MG PO TABS: 600.0000 mg | ORAL_TABLET | Freq: Three times a day (TID) | ORAL | Status: DC | PRN

## 2016-10-15 MED ORDER — PROPRANOLOL HCL 10 MG PO TABS
10.0000 mg | ORAL_TABLET | Freq: Two times a day (BID) | ORAL | Status: DC
  Filled 2016-10-15 (×2): qty 1

## 2016-10-15 MED ORDER — ONDANSETRON HCL 4 MG PO TABS: 4.0000 mg | ORAL_TABLET | Freq: Three times a day (TID) | ORAL | Status: DC | PRN

## 2016-10-15 MED ORDER — HYDROXYZINE HCL 25 MG PO TABS
25.0000 mg | ORAL_TABLET | Freq: Four times a day (QID) | ORAL | Status: DC | PRN
  Administered 2016-10-15: 25 mg via ORAL
  Filled 2016-10-15: qty 1

## 2016-10-15 MED ORDER — QUETIAPINE FUMARATE 100 MG PO TABS
300.0000 mg | ORAL_TABLET | Freq: Every day | ORAL | Status: DC
  Administered 2016-10-15: 300 mg via ORAL
  Filled 2016-10-15: qty 3

## 2016-10-15 MED ORDER — DIVALPROEX SODIUM 250 MG PO DR TAB
500.0000 mg | DELAYED_RELEASE_TABLET | Freq: Two times a day (BID) | ORAL | Status: DC
  Administered 2016-10-15: 500 mg via ORAL
  Filled 2016-10-15 (×2): qty 2

## 2016-10-15 MED ORDER — NICOTINE 21 MG/24HR TD PT24
21.0000 mg | MEDICATED_PATCH | Freq: Every day | TRANSDERMAL | Status: DC
  Administered 2016-10-15: 21 mg via TRANSDERMAL
  Filled 2016-10-15: qty 1

## 2016-10-15 NOTE — BH Assessment (Addendum)
Tele Assessment Note   Patient Name: Philip Thompson MRN: [id number] Referring Physician: Wilson Singer, MD Location of Patient: [facility] Location of Provider: [place] is an 26 y.o. male.   Philip Thompson is an 26 y.o. single male who presents unaccompanied to [facility] ED after being petitioned for involuntary commitment by his mother, Philip Thompson [phone]. Pt was discharged from [facility] 10/14/16 at about 2 pm. Per prt record and LE pt returned home and overdosed unintentionally on Oxycontin/Oxycodone. Pt strongly denies any SI. Pt denies HI, SHI and AVH. Upon arriving by [facility] in the [place], pt reportedly was cursing and agitated. LE stayed bedside and as pt became more agitated, pt was handcuffed to his bed for safety.  Pt reports he cares for his father who he lives with, who has dementia. Per pt, his father can be agitated and unreasonable. Pt reports his father overtakes his pain medications, goes into withdrawal and becomes angry. [name] mother who lives in another location checks on pt and father "several times a week." Pt reports he also lives with his girlfriend. He is currently unemployed. He denies any history of abuse or trauma. He denies any legal problems. Pt says he is currently receiving outpatient medication management through [facility].  Pt says he is compliant with medications. Pt has multiple admissions to [facility] this year and several ED visits in which he was observed overnight and then discharged.   Pt denies any history of suicide attempts however pt hx reports at least 3 suicide attempts and other unintentional overdoses. Pt denies current homicidal ideation or any history of aggression or violence however, pt has a hx of physical and verbal aggression with his parents, LE and ED staff as displayed tonight in the ED. Pt denies any history of auditory or visual hallucinations. Pt reports he smokes one joint of marijuana 2-3 times per week. He  denies any other substance use, but then added that he sometimes takes unprescribed Adderall. [name] BAL is negative however urine drug screen is positive for amphetamines, cannabis, benzodiazepines and opiates.   Pt is dressed in hospital scrubs, sleepy and oriented x4. Pt speaks in a slow slurred tone, at normal volume and pace. Motor behavior appears slower than normall. Eye contact is good. [name] mood is stated as "okay" and affect is blunted. Thought process is coherent and relevant. There is no indication pt is currently responding to internal stimuli or experiencing delusional thought content. Pt insists that he is not depressed, he is not suicidal and that he is safe to go home.    Diagnosis: Bipolar I Disorder, Current Episode Depressed, Severe Without Psychotic Features   Past Medical History:  Past Medical History:  Diagnosis Date  . ADD (attention deficit disorder)   . Allergic rhinitis   . Anxiety   . Arthritis    joint pain  . Frontal head injury     Past Surgical History:  Procedure Laterality Date  . FOOT SURGERY     to get a BB out  . NO PAST SURGERIES      Family History:  Family History  Problem Relation Age of Onset  . Dementia Father   . Alcohol abuse Father     Social History:  reports that he has been smoking Cigarettes.  He has a 7.00 pack-year smoking history. He has never used smokeless tobacco. He reports that he drinks alcohol. He reports that he uses drugs, including Marijuana.  Additional  Social History:  Alcohol / Drug Use Prescriptions: SEE MAR History of alcohol / drug use?: Yes Longest period of sobriety (when/how long): UNKNOWN Substance #1 Name of Substance 1: OXYCONTIN/OXYCODONE 1 - Age of First Use: UNK 1 - Amount (size/oz): OD - BROUGHT BACK BY [place] BY PD 1 - Frequency: UNKNOWN 1 - Duration: ONGOING 1 - Last Use / Amount: 10/14/16 Substance #2 Name of Substance 2: ADDERALL - OFF THE STREET 2 - Age of First Use: CHILD 2 - Amount  (size/oz): VARIES 2 - Frequency: WEEKLY 2 - Duration: ONGOING 2 - Last Use / Amount: 10/11/16 Substance #3 Name of Substance 3: CANNABIS 3 - Age of First Use: TEEN 3 - Amount (size/oz): 1/2 TO 1 JOINT 3 - Frequency: 2-3 X WEEK 3 - Duration: ONGOING 3 - Last Use / Amount: 10/14/16 Substance #4 Name of Substance 4: NICOTINE/CIGARETTES 4 - Age of First Use: TEENS 4 - Amount (size/oz): 1 PACK 4 - Frequency: DAY 4 - Duration: ONGOING 4 - Last Use / Amount: 10/14/16 Substance #5 Name of Substance 5: ALCOHOL 5 - Age of First Use: TEEN 5 - Amount (size/oz): UNK 5 - Frequency: UNK 5 - Duration: STOPPED 5 - Last Use / Amount: STS STOPPED 01/2016  CIWA: CIWA-Ar BP: 115/68 Pulse Rate: (!) 123 COWS:    PATIENT STRENGTHS: (choose at least two) Average or above average intelligence Communication skills Supportive family/friends  Allergies: No Known Allergies  Home Medications:  (Not in a hospital admission)  OB/GYN Status:  No LMP for male patient.  General Assessment Data Location of Assessment: [facility] TTS Assessment: In system Is this a Tele or Face-to-Face Assessment?: Tele Assessment Is this an Initial Assessment or a Re-assessment for this encounter?: Initial Assessment Marital status: Single Is patient pregnant?: No Pregnancy Status: No Living Arrangements:  (DAD, GF) Can pt return to current living arrangement?: Yes Admission Status: Involuntary Is patient capable of signing voluntary admission?: Yes Referral Source: Self/Family/Friend Insurance type:  (SELF PAY)     Crisis Care Plan Living Arrangements:  (DAD, GF) Name of Psychiatrist:  ([facility]) Name of Therapist:  ([facility] )  Education Status Is patient currently in school?: No Highest grade of school patient has completed:  ([employer])  Risk to self with the past 6 months Suicidal Ideation: No (DENIES) Has patient been a risk to self within the past 6 months prior to admission? : Yes Suicidal  Intent: No Has patient had any suicidal intent within the past 6 months prior to admission? : Yes Is patient at risk for suicide?: Yes Suicidal Plan?: No Has patient had any suicidal plan within the past 6 months prior to admission? 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 : Yes (comment) Thoughts of Harm to Others: No (DENIES) Current Homicidal Intent: No Current Homicidal  Plan: No Access to Homicidal Means: No Identified Victim:  (NONE) History of harm to others?: Yes Assessment of Violence: On admission Violent Behavior Description:  (ASSAULTIVE BEHAVIOR TOWARD LE & ED STAFF TONIGHT) Does patient have access to weapons?: No Criminal Charges Pending?: No (DENIES) Does patient have a court date: No Is patient on probation?: No  Psychosis Hallucinations: None noted (DENIES) Delusions: None noted  Mental Status Report Appearance/Hygiene: Disheveled Eye Contact: Good Motor Activity: Freedom of movement, Psychomotor retardation Speech: Logical/coherent, Slow, Slurred Level of Consciousness: Quiet/awake Mood: Apathetic, Pleasant Affect: Apathetic, Blunted Anxiety Level: None Thought Processes:  Coherent, Relevant Judgement: Partial Orientation: Person, Place, Time, Situation Obsessive Compulsive Thoughts/Behaviors: None  Cognitive Functioning Concentration: Decreased Memory: Recent Intact, Remote Intact IQ: Average Insight: Poor Impulse Control: Poor Appetite: Fair Weight Loss:  (UTA) Weight Gain:  (UTA) Sleep: No Change Total Hours of Sleep:  (7) Vegetative Symptoms: None  ADLScreening [facility]) Patient's cognitive ability adequate to safely complete daily activities?: Yes Patient able to express need for assistance with ADLs?: Yes Independently performs ADLs?: Yes (appropriate for developmental age)  Prior Inpatient Therapy Prior Inpatient Therapy: Yes Prior Therapy Dates:  (MULTIPLE - LAST D/O 10/14/16 FROM [facility]) Prior Therapy Facilty/Provider(s):  [facility]) Reason for Treatment:  (SUBSTANCE-INDUCED BIPOLAR BY HX; SA)  Prior Outpatient Therapy Prior Outpatient Therapy: Yes Prior Therapy Dates:  (CURRENT) Prior Therapy Facilty/Provider(s):  ([facility]) Reason for Treatment:  (SA, SUBSTANCE-INDUCED BIPOLAR BY HX) Does patient have an ACCT team?: No Does patient have Intensive In-House Services?  : No Does patient have [facility] services? : No Does patient have P4CC services?: No  ADL Screening (condition at time of admission) Patient's cognitive ability adequate to safely complete daily activities?: Yes Patient able to express need for assistance with ADLs?: Yes Independently performs ADLs?: Yes (appropriate for developmental age)       Abuse/Neglect Assessment (Assessment to be complete while patient is alone) Physical Abuse: Denies Verbal Abuse: Denies Sexual Abuse: Denies Exploitation of patient/patient's resources: Denies Self-Neglect: Denies     Regulatory affairs officer (For Healthcare) Does Patient Have a Medical Advance Directive?: No Would patient like information on creating a medical advance directive?: No - Patient declined     Additional Information 1:1 In Past 12 Months?: Yes CIRT Risk: Yes Elopement Risk: Yes Does patient have medical clearance?: Yes     Disposition:  Disposition Initial Assessment Completed for this Encounter: Yes Disposition of Patient: Other dispositions Type of inpatient treatment program: Adult Other disposition(s): Other (Comment) (PENDING REVIEW W [facility] EXTENDER)  This service was provided via telemedicine using a 2-way, interactive audio and video technology.  Names of all persons participating in this telemedicine service and their role in this encounter. Name: Philip Thompson Role: Patient  Name:  Role:   Name:  Role:   Name:  Role:     Consulted Lindon Romp, NP. Recommend observation for 24 hours for safety & stability. Re-evaluation for possible discharge.   Spoke to Dr. Wyvonnia Dusky, [place] [facility] [place] and advised of recommendation. He stated he agreed.   Philip Kurtz, MS, CRC, [place] Triage Specialist [facility] T 10/15/2016 2:21 AM

## 2016-10-15 NOTE — Progress Notes (Signed)
Writer faxed outpatient resources for patient at discharge. [facility] Elmarie Shiley was informed.  Melbourne Abts, MSW, LCSWA Clinical social worker in disposition [facility], TTS Office [phone]

## 2016-10-15 NOTE — ED Notes (Signed)
On telephone with mother

## 2016-10-15 NOTE — ED Notes (Signed)
Meal provided 

## 2016-10-15 NOTE — ED Notes (Signed)
Discussion with pt regarding plan of care, reassessment by [facility] NP  Unknown time till then  Pt ask for and given telephone

## 2016-10-15 NOTE — ED Notes (Signed)
Per [facility] note, pt is to be discharged with outpt SA referrals

## 2016-10-15 NOTE — Discharge Instructions (Signed)
Follow up with primary doctor and outpatient behavioral health resources.

## 2016-10-15 NOTE — ED Notes (Signed)
Pt with multple request regarding how much narcan it took to revive him.  He became very animated regarding his resuscitation and amount of med to recover him.

## 2016-10-15 NOTE — ED Notes (Signed)
Handcuffs removed by [employer].

## 2016-10-15 NOTE — ED Notes (Signed)
Pt speaking with mother on the phone.

## 2016-10-15 NOTE — ED Notes (Signed)
Awaiting eval/reeval bu [facility]

## 2016-10-15 NOTE — ED Notes (Signed)
Pt request to see EDP. Dr Hyacinth Meeker apprised

## 2016-10-15 NOTE — ED Notes (Signed)
TTS Machine in room awaiting [facility] call

## 2016-10-15 NOTE — ED Notes (Signed)
Unable to find clothing for pt  Given blue scrubs to wear home

## 2016-10-15 NOTE — Consult Note (Signed)
Telepsych Consultation   Reason for Consult:  Overdose Referring Physician:  Virgel Manifold, MD Location of Patient: [facility] Location of Provider: [facility]  Patient Identification: Philip Thompson MRN:  [id number] Principal Diagnosis: Polysubstance abuse [facility]) Diagnosis:   Patient Active Problem List   Diagnosis Date Noted  . Polysubstance abuse ([place]) [F19.10] 10/15/2016  . Stimulant use disorder [F15.90] 10/12/2016  . Substance or medication-induced bipolar and related disorder with onset during intoxication ([place]) [F19.94] 07/05/2016  . Opioid use disorder, moderate, dependence ([place]) [F11.20] 07/05/2016  . Cannabis use disorder, severe, dependence ([place]) [F12.20] 07/05/2016  . Substance induced mood disorder ([place]) [F19.94] 06/30/2016  . Overdose [T50.901A] 08/28/2013  . Alcohol intoxication ([place]) [F10.929] 08/28/2013  . Physically aggressive behavior [R46.89] 08/28/2013  . Severely aggressive behavior [R46.89] 08/28/2013  . Marijuana abuse [F12.10] 08/28/2013  . Suicidal ideation [R45.851] 08/28/2013  . Homicidal ideation [R45.850] 08/28/2013  . Insomnia [G47.00] 02/20/2013  . Chronic back pain [M54.9, G89.29] 07/05/2012    Total Time spent with patient: 45 minutes  Subjective:   [name] is a 26 y.o. male patient presents to [place] with complaint of unintentional overdose.Marland Kitchen  HPI:  Philip Thompson, 26 y.o., male patient seen by this provider on 10/15/16.  Chart reviewed and consulted with Dr. Dwyane Dee.  On evaluation Arafat L Pietsch reports that he was just discharged from [facility].  The day he was discharged he brought pills from a friend, Pain pill, Xanax and Adderall.   States that he is also taking Seroquel 300 mg Q hs.  States that he was not trying to kill himself and made excuse why he was taking the medications.  "When I got out the hospital I needed something for the back pain.  I am taking care of my father who has early stages of dementia and it takes  a lot out of me.  I need something to help me relax.  Discussed that the combinations of pills that he was taking decreases CNS and is the cause of a lot of accidental overdoses.  Patient states that he does not want to die.  Patient denies suicidal/homicidal ideation, psychosis, and paranoia.   During interview patient calm/cooperative, alert/oriented x 4, and does not appear to be responding to internal/external stimuli.  Denies suicidal/homicidal ideation, psychosis, and paranoia.  Patient informed of the risk with his polysubstance abuse and offered resources.  Patient stated that he was working with [facility] and has his next appointment next week.    Past Psychiatric History: Polysubstance abuse, Stimulant and THC use disorder, Substance induced mood disorder, MDD,   Risk to Self: Suicidal Ideation: No (DENIES) Suicidal Intent: No Is patient at risk for suicide?: Yes Suicidal Plan?: No Access to Means: No (DENIES ACCESS TO GUNS) What has been your use of drugs/alcohol within the last 12 months?:  (REGULAR USE) How many times?:  (3) Other Self Harm Risks:  (NONE REPORTED) Triggers for Past Attempts: None known Intentional Self Injurious Behavior: None Risk to Others: Homicidal Ideation: No (DENIES) Thoughts of Harm to Others: No (DENIES) Current Homicidal Intent: No Current Homicidal Plan: No Access to Homicidal Means: No Identified Victim:  (NONE) History of harm to others?: Yes Assessment of Violence: On admission Violent Behavior Description:  (ASSAULTIVE BEHAVIOR TOWARD LE & ED STAFF TONIGHT) Does patient have access to weapons?: No Criminal Charges Pending?: No (DENIES) Does patient have a court date: No Prior Inpatient Therapy: Prior Inpatient Therapy: Yes Prior Therapy Dates:  (MULTIPLE -  LAST D/O 10/14/16 FROM [facility]) Prior Therapy Facilty/Provider(s):  [facility]) Reason for Treatment:  (SUBSTANCE-INDUCED BIPOLAR BY HX; SA) Prior Outpatient Therapy: Prior Outpatient Therapy:  Yes Prior Therapy Dates:  (CURRENT) Prior Therapy Facilty/Provider(s):  ([facility]) Reason for Treatment:  (SA, SUBSTANCE-INDUCED BIPOLAR BY HX) Does patient have an ACCT team?: No Does patient have Intensive In-House Services?  : No Does patient have [facility] services? : No Does patient have P4CC services?: No  Past Medical History:  Past Medical History:  Diagnosis Date  . ADD (attention deficit disorder)   . Allergic rhinitis   . Anxiety   . Arthritis    joint pain  . Frontal head injury   . Heroin overdose     Past Surgical History:  Procedure Laterality Date  . FOOT SURGERY     to get a BB out  . NO PAST SURGERIES     Family History:  Family History  Problem Relation Age of Onset  . Dementia Father   . Alcohol abuse Father    Family Psychiatric  History: See above Social History:  History  Alcohol Use  . Yes    Comment: hx of. last use 01/2016     History  Drug Use  . Types: Marijuana    Comment: last use last night    Social History   Social History  . Marital status: Single    Spouse name: N/A  . Number of children: N/A  . Years of education: N/A   Social History Main Topics  . Smoking status: Current Every Day Smoker    Packs/day: 1.00    Years: 7.00    Types: Cigarettes  . Smokeless tobacco: Never Used  . Alcohol use Yes     Comment: hx of. last use 01/2016  . Drug use: Yes    Types: Marijuana     Comment: last use last night  . Sexual activity: Yes    Birth control/ protection: Condom   Other Topics Concern  . None   Social History Narrative  . None   Additional Social History:    Allergies:  No Known Allergies  Labs:  Results for orders placed or performed during the hospital encounter of 10/14/16 (from the past 48 hour(s))  Comprehensive metabolic panel     Status: Abnormal   Collection Time: 10/14/16 10:01 PM  Result Value Ref Range   Sodium 139 135 - 145 mmol/L   Potassium 4.2 3.5 - 5.1 mmol/L   Chloride 106 101 - 111  mmol/L   CO2 25 22 - 32 mmol/L   Glucose, Bld 102 (H) 65 - 99 mg/dL   BUN 17 6 - 20 mg/dL   Creatinine, Ser 0.94 0.61 - 1.24 mg/dL   Calcium 9.9 8.9 - 10.3 mg/dL   Total Protein 7.8 6.5 - 8.1 g/dL   Albumin 4.6 3.5 - 5.0 g/dL   AST 13 (L) 15 - 41 U/L   ALT 12 (L) 17 - 63 U/L   Alkaline Phosphatase 60 38 - 126 U/L   Total Bilirubin 0.3 0.3 - 1.2 mg/dL   GFR calc non Af Amer >60 >60 mL/min   GFR calc Af Amer >60 >60 mL/min    Comment: (NOTE) The eGFR has been calculated using the CKD EPI equation. This calculation has not been validated in all clinical situations. eGFR's persistently <60 mL/min signify possible Chronic Kidney Disease.    Anion gap 8 5 - 15  CBC with Diff     Status: Abnormal  Collection Time: 10/14/16 10:01 PM  Result Value Ref Range   WBC 17.3 (H) 4.0 - 10.5 K/uL   RBC 5.20 4.22 - 5.81 MIL/uL   Hemoglobin 14.8 13.0 - 17.0 g/dL   HCT 44.0 39.0 - 52.0 %   MCV 84.6 78.0 - 100.0 fL   MCH 28.5 26.0 - 34.0 pg   MCHC 33.6 30.0 - 36.0 g/dL   RDW 13.9 11.5 - 15.5 %   Platelets 158 150 - 400 K/uL   Neutrophils Relative % 78 %   Neutro Abs 13.4 (H) 1.7 - 7.7 K/uL   Lymphocytes Relative 12 %   Lymphs Abs 2.1 0.7 - 4.0 K/uL   Monocytes Relative 9 %   Monocytes Absolute 1.6 (H) 0.1 - 1.0 K/uL   Eosinophils Relative 1 %   Eosinophils Absolute 0.2 0.0 - 0.7 K/uL   Basophils Relative 0 %   Basophils Absolute 0.0 0.0 - 0.1 K/uL  Ethanol     Status: None   Collection Time: 10/14/16 10:02 PM  Result Value Ref Range   Alcohol, Ethyl (B) <10 <10 mg/dL    Comment:        LOWEST DETECTABLE LIMIT FOR SERUM ALCOHOL IS 10 mg/dL FOR MEDICAL PURPOSES ONLY Please note change in reference range.   Urine rapid drug screen (hosp performed)     Status: Abnormal   Collection Time: 10/14/16 10:03 PM  Result Value Ref Range   Opiates POSITIVE (A) NONE DETECTED   Cocaine NONE DETECTED NONE DETECTED   Benzodiazepines POSITIVE (A) NONE DETECTED   Amphetamines POSITIVE (A) NONE  DETECTED   Tetrahydrocannabinol POSITIVE (A) NONE DETECTED   Barbiturates NONE DETECTED NONE DETECTED    Comment:        DRUG SCREEN FOR MEDICAL PURPOSES ONLY.  IF CONFIRMATION IS NEEDED FOR ANY PURPOSE, NOTIFY LAB WITHIN 5 DAYS.        LOWEST DETECTABLE LIMITS FOR URINE DRUG SCREEN Drug Class       Cutoff (ng/mL) Amphetamine      1000 Barbiturate      200 Benzodiazepine   536 Tricyclics       144 Opiates          300 Cocaine          300 THC              50   Salicylate level     Status: None   Collection Time: 10/14/16 11:29 PM  Result Value Ref Range   Salicylate Lvl <3.1 2.8 - 30.0 mg/dL  Acetaminophen level     Status: Abnormal   Collection Time: 10/14/16 11:29 PM  Result Value Ref Range   Acetaminophen (Tylenol), Serum <10 (L) 10 - 30 ug/mL    Comment:        THERAPEUTIC CONCENTRATIONS VARY SIGNIFICANTLY. A RANGE OF 10-30 ug/mL MAY BE AN EFFECTIVE CONCENTRATION FOR MANY PATIENTS. HOWEVER, SOME ARE BEST TREATED AT CONCENTRATIONS OUTSIDE THIS RANGE. ACETAMINOPHEN CONCENTRATIONS >150 ug/mL AT 4 HOURS AFTER INGESTION AND >50 ug/mL AT 12 HOURS AFTER INGESTION ARE OFTEN ASSOCIATED WITH TOXIC REACTIONS.     Medications:  Current Facility-Administered Medications  Medication Dose Route Frequency Provider Last Rate Last Dose  . divalproex (DEPAKOTE) DR tablet 500 mg  500 mg Oral Q12H Virgel Manifold, MD   500 mg at 10/15/16 1018  . hydrOXYzine (ATARAX/VISTARIL) tablet 25 mg  25 mg Oral Q6H PRN Virgel Manifold, MD   25 mg at 10/15/16 0737  . ibuprofen (ADVIL,MOTRIN) tablet 600  mg  600 mg Oral Q8H PRN Virgel Manifold, MD      . nicotine (NICODERM CQ - dosed in mg/24 hours) patch 21 mg  21 mg Transdermal Daily Virgel Manifold, MD   21 mg at 10/15/16 1018  . ondansetron (ZOFRAN) tablet 4 mg  4 mg Oral Q8H PRN Virgel Manifold, MD      . propranolol (INDERAL) tablet 10 mg  10 mg Oral BID Virgel Manifold, MD      . QUEtiapine (SEROQUEL) tablet 300 mg  300 mg Oral QHS Virgel Manifold, MD   300 mg at 10/15/16 0228   Current Outpatient Prescriptions  Medication Sig Dispense Refill  . divalproex (DEPAKOTE) 500 MG DR tablet Take 1 tablet (500 mg total) by mouth every 12 (twelve) hours. For mood stabilization 60 tablet 0  . hydrOXYzine (ATARAX/VISTARIL) 25 MG tablet Take 1 tablet (25 mg total) by mouth every 6 (six) hours as needed for anxiety. 60 tablet 0  . QUEtiapine (SEROQUEL) 300 MG tablet Take 1 tablet (300 mg total) by mouth at bedtime. For mood control 30 tablet 0  . propranolol (INDERAL) 10 MG tablet Take 1 tablet (10 mg total) by mouth 2 (two) times daily. For anxiety 60 tablet 0    Musculoskeletal: Strength & Muscle Tone: within normal limits Gait & Station: normal Patient leans: N/A  Psychiatric Specialty Exam: Physical Exam  Nursing note and vitals reviewed. Constitutional: He is oriented to person, place, and time.  Neck: Normal range of motion.  Respiratory: Effort normal.  Musculoskeletal: Normal range of motion.  Neurological: He is alert and oriented to person, place, and time.  Psychiatric: His speech is normal and behavior is normal. Thought content normal. His mood appears anxious. Cognition and memory are normal. He expresses impulsivity. He exhibits a depressed mood.    Review of Systems  Psychiatric/Behavioral: Positive for depression (Stable) and substance abuse (Polysubstance abuse). Hallucinations: denies. Memory loss: Denies. Suicidal ideas: Denies. The patient is nervous/anxious (Stable). Insomnia: Denies.   All other systems reviewed and are negative.   Blood pressure 118/78, pulse 88, temperature (!) 96.7 F (35.9 C), temperature source Axillary, resp. rate 20, SpO2 100 %.There is no height or weight on file to calculate BMI.  General Appearance: Casual  Eye Contact:  Good  Speech:  Clear and Coherent and Normal Rate  Volume:  Normal  Mood:  Anxious  Affect:  Congruent  Thought Process:  Coherent and Goal Directed   Orientation:  Full (Time, Place, and Person)  Thought Content:  denies hallucinations, delusions, and paranoia  Suicidal Thoughts:  No  Homicidal Thoughts:  No  Memory:  Immediate;   Good Recent;   Good Remote;   Good  Judgement:  Fair  Insight:  Lacking  Psychomotor Activity:  Normal  Concentration:  Concentration: Good and Attention Span: Good  Recall:  Good  Fund of Knowledge:  Fair  Language:  Good  Akathisia:  No  Handed:  Right  AIMS (if indicated):     Assets:  Communication Skills Desire for Improvement Housing Social Support  ADL's:  Intact  Cognition:  WNL  Sleep:        Treatment Plan Summary: Plan Discharge home with outpatient resources for substance abuse.  Keep appointment with [facility]  Disposition: No evidence of imminent risk to self or others at present.   Patient does not meet criteria for psychiatric inpatient admission.  This service was provided via telemedicine using a 2-way, interactive audio and video technology.  Names of all persons participating in this telemedicine service and their role in this encounter. Name: Earleen Newport NP Role: Telepsych  Name: Dr. Dwyane Dee Role: Psychiatrist  Name:  Role:   Name:  Role:     Earleen Newport, NP 10/15/2016 5:11 PM

## 2016-10-15 NOTE — ED Notes (Signed)
Pt is pale and sleeping  Respirations are shallow Pulse ox applied

## 2016-10-15 NOTE — ED Notes (Signed)
Pt is now very lethargic and cannot hold his eyes open.  Pt has not left his room and had no belongings to have taken any meds.  Will continue to monitor.

## 2016-10-15 NOTE — ED Notes (Signed)
Pt reports that he is due meds, wants to know what is on his IVC paperwork   Pt is educated that meds will be determined after eval by [facility] NP and that determination of commitment is physician and extender as well as pt determination

## 2016-10-15 NOTE — ED Notes (Signed)
Pt very hyperverbal at this time stating he does not need to be here.  When asked if he felt he had a substance abuse issue, pt stated he did not think so.  Admits to using substances recreationally but does not think this is a problem.  Denies SI/HI and states he is tired of people controlling his life.

## 2016-10-15 NOTE — ED Notes (Signed)
Lunch has been provided

## 2016-10-15 NOTE — ED Notes (Signed)
[employer] signed off  

## 2017-01-21 ENCOUNTER — Ambulatory Visit: Payer: BLUE CROSS/BLUE SHIELD | Admitting: Nurse Practitioner

## 2017-01-21 ENCOUNTER — Other Ambulatory Visit: Payer: Self-pay | Admitting: Nurse Practitioner

## 2017-01-21 ENCOUNTER — Telehealth: Payer: Self-pay | Admitting: Nurse Practitioner

## 2017-01-21 DIAGNOSIS — F1994 Other psychoactive substance use, unspecified with psychoactive substance-induced mood disorder: Secondary | ICD-10-CM

## 2017-01-21 DIAGNOSIS — F19929 Other psychoactive substance use, unspecified with intoxication, unspecified: Secondary | ICD-10-CM | POA: Diagnosis not present

## 2017-01-21 MED ORDER — QUETIAPINE FUMARATE 300 MG PO TABS: 300.0000 mg | ORAL_TABLET | Freq: Every day | ORAL | 0 refills | Status: DC

## 2017-01-21 MED ORDER — DIVALPROEX SODIUM 500 MG PO DR TAB: 500.0000 mg | DELAYED_RELEASE_TABLET | Freq: Two times a day (BID) | ORAL | 0 refills | Status: DC

## 2017-01-21 NOTE — Telephone Encounter (Signed)
I sent in one month refills on his meds to buy [place] time to get him to another mental health provider. Let us know Monday what she finds out. Thanks.

## 2017-01-21 NOTE — Telephone Encounter (Signed)
Patient is unable to get into [place] today, and all of the home behavioral health offices seemed to be closed today.  She is going to try again on Monday.

## 2017-01-21 NOTE — Telephone Encounter (Signed)
Patient notified and is going to get an appointment with mental health for further refills.

## 2017-01-22 ENCOUNTER — Encounter: Payer: Self-pay | Admitting: Nurse Practitioner

## 2017-01-22 NOTE — Progress Notes (Addendum)
Subjective:  Presents with his mother to discuss his mental health care. Mother present per his request. Has stopped Propranolol but continues to take his Depakote and Seroquel. Started on these medications during an admission to the behavioral health unit on 9/24. Both he and his mother state he is stable and doing much better. Went to [facility] for care but they did not want to continue his medications. Sought care at [facility] but they stated that there is nothing they can do for him. He was discharged with some of his meds but is getting to run out and wants to find a source for his mental health. Denies suicidal or homicidal thoughts or ideation.  Depression screen [facility] 2/9 01/21/2017 08/20/2013  Decreased Interest 1 0  Down, Depressed, Hopeless 0 0  PHQ - 2 Score 1 0     Objective:   There were no vitals taken for this visit. Patient is calm. Thoughts logical, coherent and relevant. Dressed appropriately. Making good eye contact.   Assessment:   Problem List Items Addressed This Visit      Nervous and Auditory   Substance or medication-induced bipolar and related disorder with onset during intoxication [facility]) - Primary       Plan:  Explained to patient and his mother that his complex needs require psychiatric care. He was in the office around 2:20. We gave him the number for [facility] in [place] which accepts walk ins until 3 pm but they were booked for the day when his mother called them. Plans to try again Monday. Given one month refill on his medications to allow time to find appropriate care. Also consulted with our referral coordinator for assistance. Given numbers for [facility] in [place] and [place]. To call us back if they need assistance or referral. Seek help immediately if urgent issues.

## 2017-01-27 ENCOUNTER — Other Ambulatory Visit: Payer: Self-pay | Admitting: Nurse Practitioner

## 2017-02-07 NOTE — Progress Notes (Deleted)
Psychiatric Initial Adult Assessment   Patient Identification: Philip Thompson MRN:  [id number] Date of Evaluation:  02/07/2017 Referral Source: *** Chief Complaint:   Visit Diagnosis: No diagnosis found.  History of Present Illness:   Philip Thompson is a 27 y.o. year old male with a history of polysubstance abuse (stimulant, THC), r/o substance induced mood disorder, depression, who presents for follow up appointment for No diagnosis found.      Associated Signs/Symptoms: Depression Symptoms:  {DEPRESSION SYMPTOMS:20000} (Hypo) Manic Symptoms:  {BHH MANIC SYMPTOMS:22872} Anxiety Symptoms:  {BHH ANXIETY SYMPTOMS:22873} Psychotic Symptoms:  {BHH PSYCHOTIC WUJWJXBJ:47829} PTSD Symptoms: {BHH PTSD FAOZHYQM:57846}  Past Psychiatric History:  Outpatient: in 08/2016, IVC'd by his mother for becoming violent in the setting of amphetamines and marijuana use Psychiatry admission: in June, September 2018 at [facility] for worsening aggression in the setting of substance use Previous suicide attempt:  Past trials of medication:  History of violence:   Previous Psychotropic Medications: {YES/NO:21197}  Substance Abuse History in the last 12 months:  {yes no:314532}  Consequences of Substance Abuse: {BHH CONSEQUENCES OF SUBSTANCE ABUSE:22880}  Past Medical History:  Past Medical History:  Diagnosis Date  . ADD (attention deficit disorder)   . Allergic rhinitis   . Anxiety   . Arthritis    joint pain  . Frontal head injury   . Heroin overdose [facility])     Past Surgical History:  Procedure Laterality Date  . FOOT SURGERY     to get a BB out  . NO PAST SURGERIES      Family Psychiatric History: ***  Family History:  Family History  Problem Relation Age of Onset  . Dementia Father   . Alcohol abuse Father     Social History:   Social History   Socioeconomic History  . Marital status: Single    Spouse name: Not on file  . Number of children: Not on file  . Years of  education: Not on file  . Highest education level: Not on file  Social Needs  . Financial resource strain: Not on file  . Food insecurity - worry: Not on file  . Food insecurity - inability: Not on file  . Transportation needs - medical: Not on file  . Transportation needs - non-medical: Not on file  Occupational History  . Not on file  Tobacco Use  . Smoking status: Current Every Day Smoker    Packs/day: 1.00    Years: 7.00    Pack years: 7.00    Types: Cigarettes  . Smokeless tobacco: Never Used  Substance and Sexual Activity  . Alcohol use: Yes    Comment: hx of. last use 01/2016  . Drug use: Yes    Types: Marijuana    Comment: last use last night  . Sexual activity: Yes    Birth control/protection: Condom  Other Topics Concern  . Not on file  Social History Narrative  . Not on file    Additional Social History: ***  Allergies:  No Known Allergies  Metabolic Disorder Labs: Lab Results  Component Value Date   HGBA1C 5.4 07/02/2016   MPG 108 07/02/2016   Lab Results  Component Value Date   PROLACTIN 11.4 07/02/2016   Lab Results  Component Value Date   CHOL 124 07/02/2016   TRIG 69 07/02/2016   HDL 33 (L) 07/02/2016   CHOLHDL 3.8 07/02/2016   VLDL 14 07/02/2016   LDLCALC 77 07/02/2016     Current Medications: Current Outpatient Medications  Medication Sig Dispense Refill  . divalproex (DEPAKOTE) 500 MG DR tablet Take 1 tablet (500 mg total) by mouth every 12 (twelve) hours. For mood stabilization 60 tablet 0  . hydrOXYzine (ATARAX/VISTARIL) 25 MG tablet Take 1 tablet (25 mg total) by mouth every 6 (six) hours as needed for anxiety. 60 tablet 0  . propranolol (INDERAL) 10 MG tablet Take 1 tablet (10 mg total) by mouth 2 (two) times daily. For anxiety 60 tablet 0  . QUEtiapine (SEROQUEL) 300 MG tablet Take 1 tablet (300 mg total) by mouth at bedtime. For mood control 30 tablet 0   No current facility-administered medications for this visit.      Neurologic: Headache: {BHH YES OR NO:22294} Seizure: {BHH YES OR NO:22294} Paresthesias:{BHH YES OR LG:92119}  Musculoskeletal: Strength & Muscle Tone: {desc; muscle tone:32375} Gait & Station: {PE GAIT ED ERDE:08144} Patient leans: {Patient Leans:21022755}  Psychiatric Specialty Exam: ROS  There were no vitals taken for this visit.There is no height or weight on file to calculate BMI.  General Appearance: {Appearance:22683}  Eye Contact:  {BHH EYE CONTACT:22684}  Speech:  {Speech:22685}  Volume:  {Volume (PAA):22686}  Mood:  {BHH MOOD:22306}  Affect:  {Affect (PAA):22687}  Thought Process:  {Thought Process (PAA):22688}  Orientation:  {BHH ORIENTATION (PAA):22689}  Thought Content:  {Thought Content:22690}  Suicidal Thoughts:  {ST/HT (PAA):22692}  Homicidal Thoughts:  {ST/HT (PAA):22692}  Memory:  {BHH MEMORY:22881}  Judgement:  {Judgement (PAA):22694}  Insight:  {Insight (PAA):22695}  Psychomotor Activity:  {Psychomotor (PAA):22696}  Concentration:  {Concentration:21399}  Recall:  {BHH GOOD/FAIR/POOR:22877}  Fund of Knowledge:{BHH GOOD/FAIR/POOR:22877}  Language: {BHH GOOD/FAIR/POOR:22877}  Akathisia:  {BHH YES OR NO:22294}  Handed:  {Handed:22697}  AIMS (if indicated):  ***  Assets:  {Assets (PAA):22698}  ADL's:  {BHH YJE'H:63149}  Cognition: {chl bhh cognition:304700322}  Sleep:  ***    Treatment Plan Summary: {CHL AMB BH MD TX FWYO:[id number]}   Neysa Hotter, MD 1/21/20199:32 AM

## 2017-02-09 ENCOUNTER — Ambulatory Visit (HOSPITAL_COMMUNITY): Payer: Self-pay | Admitting: Psychiatry

## 2017-02-10 ENCOUNTER — Other Ambulatory Visit: Payer: Self-pay | Admitting: Nurse Practitioner

## 2017-02-10 ENCOUNTER — Telehealth: Payer: Self-pay | Admitting: Nurse Practitioner

## 2017-02-10 MED ORDER — HYDROXYZINE HCL 25 MG PO TABS: 25.0000 mg | ORAL_TABLET | Freq: Four times a day (QID) | ORAL | 0 refills | Status: DC | PRN

## 2017-02-10 MED ORDER — QUETIAPINE FUMARATE 300 MG PO TABS: 300.0000 mg | ORAL_TABLET | Freq: Every day | ORAL | 0 refills | Status: DC

## 2017-02-10 MED ORDER — DIVALPROEX SODIUM 500 MG PO DR TAB: 500.0000 mg | DELAYED_RELEASE_TABLET | Freq: Two times a day (BID) | ORAL | 0 refills | Status: DC

## 2017-02-10 NOTE — Telephone Encounter (Signed)
Mom called stating that [facility] called him two days before his appt to let him know that they wouldn't see him on his scheduled appt. Pt is unable to be seen until April. Mom is wanting to know if we will refill his meds until his is able to be seen. Please advise.

## 2017-02-10 NOTE — Telephone Encounter (Signed)
Left message to return call 

## 2017-02-10 NOTE — Telephone Encounter (Signed)
Sent in a month's refills. Since this is so far out for him to be seen, I recommend an office visit with me sometime in the next 30 days just for [place] to touch base and make sure he is stable. Then I can refill to last him to psychiatry appointment. Thanks.

## 2017-02-10 NOTE — Telephone Encounter (Signed)
Pt notified. He will call back to scheduled office visit

## 2017-02-10 NOTE — Telephone Encounter (Signed)
Left message to return call to find out which meds he needs refills on

## 2017-02-10 NOTE — Telephone Encounter (Signed)
appt April 2nd with specialist. Can you refill depakote, seroquel and vistaril to last til then the drug store.

## 2017-02-25 ENCOUNTER — Ambulatory Visit: Payer: BLUE CROSS/BLUE SHIELD | Admitting: Nurse Practitioner

## 2017-03-09 ENCOUNTER — Encounter: Payer: Self-pay | Admitting: Nurse Practitioner

## 2017-03-09 ENCOUNTER — Ambulatory Visit: Payer: BLUE CROSS/BLUE SHIELD | Admitting: Nurse Practitioner

## 2017-03-09 VITALS — BP 128/80 | Ht 67.5 in | Wt 194.2 lb

## 2017-03-09 DIAGNOSIS — Z029 Encounter for administrative examinations, unspecified: Secondary | ICD-10-CM

## 2017-03-09 DIAGNOSIS — G8929 Other chronic pain: Secondary | ICD-10-CM

## 2017-03-09 DIAGNOSIS — F1994 Other psychoactive substance use, unspecified with psychoactive substance-induced mood disorder: Secondary | ICD-10-CM | POA: Diagnosis not present

## 2017-03-09 DIAGNOSIS — M5442 Lumbago with sciatica, left side: Secondary | ICD-10-CM

## 2017-03-09 DIAGNOSIS — F5104 Psychophysiologic insomnia: Secondary | ICD-10-CM

## 2017-03-09 DIAGNOSIS — F19929 Other psychoactive substance use, unspecified with intoxication, unspecified: Secondary | ICD-10-CM | POA: Diagnosis not present

## 2017-03-09 MED ORDER — TRAZODONE HCL 50 MG PO TABS: ORAL_TABLET | ORAL | 2 refills | Status: DC

## 2017-03-09 NOTE — Progress Notes (Signed)
Subjective:    Patient ID: Philip Thompson, male    DOB: 15-Aug-1990, 27 y.o.   MRN: [id number]  CC: med discussion  HPI:  27 year old white male presents today for med discussion.  He is concerned because his medications have not been adequately controlling his anxiety and he has been having trouble sleeping because of that.  Has taken Trazodone in the past without difficulty. He also has trouble sleeping because of pain in his left leg.  The pain started five years ago but has gotten worse over the past two years.  It occurs every other week for multiple days.  He describes the pain as a severe pulling sensation, like his muscles are tearing apart.  The pain travels from his hip to his toes.  There is no obvious trigger for the pain, but he believes his previous back issues contribute to it.  He has seen Dr Channing Mutters in the past for his back problems.  Changing positions, stretching, and flexeril do not help the pain.  Past Medical History:  Diagnosis Date  . ADD (attention deficit disorder)   . Allergic rhinitis   . Anxiety   . Arthritis    joint pain  . Frontal head injury   . Heroin overdose [facility])    Past Surgical History:  Procedure Laterality Date  . FOOT SURGERY     to get a BB out  . NO PAST SURGERIES     Accidents/injuries: none in the past three months. Hospitalizations: none in the past three months. Immunizations: UTD  Family History  Problem Relation Age of Onset  . Dementia Father   . Alcohol abuse Father   . Diabetes Brother   . Cancer - Ovarian Maternal Grandmother   . Cancer - Lung Paternal Grandmother   . Cancer Paternal Grandfather   . Diabetes Other   . Hypertension Other   . Heart disease Other   . Heart attack Other    Psychosocial history: currently unemployed and lives with his father.  Has had increased stress since his father's stroke. Tries to maintain a healthy diet.  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 Smokes one ppd for the last eight  years.  Does not drink alcohol.  Denies any other drug use.  Has not traveled outside the [place] in the last six months.  Sexual history: currently does not have any sexual partners.  Used condoms for STI protection and contraception in the past.  No Known Allergies  Outpatient Medications Prior to Visit  Medication Sig Dispense Refill  . divalproex (DEPAKOTE) 500 MG DR tablet Take 1 tablet (500 mg total) by mouth every 12 (twelve) hours. For mood stabilization 60 tablet 0  . hydrOXYzine (ATARAX/VISTARIL) 25 MG tablet Take 1 tablet (25 mg total) by mouth every 6 (six) hours as needed for anxiety. 60 tablet 0  . propranolol (INDERAL) 10 MG tablet Take 1 tablet (10 mg total) by mouth 2 (two) times daily. For anxiety 60 tablet 0  . QUEtiapine (SEROQUEL) 300 MG tablet Take 1 tablet (300 mg total) by mouth at bedtime. For mood control 30 tablet 0   No facility-administered medications prior to visit.   Stopped taking propanolol because it was not helping him.  Review of Systems  Constitutional: Negative for activity change, appetite change and fatigue.  Respiratory: Negative for chest tightness and shortness of breath.   Cardiovascular: Negative for chest pain.  Musculoskeletal: Positive for myalgias.  Muscle spasms in left leg.  Travels from left hip down to toes.  Psychiatric/Behavioral: Positive for sleep disturbance. Negative for agitation, decreased concentration, hallucinations, self-injury and suicidal ideas. The patient is nervous/anxious.        No thoughts of harming self or others.  He is seeing a psychiatrist on April 1st       Objective:   Physical Exam  Blood pressure 128/80, height 5' 7.5" (1.715 m), weight 194 lb 3.2 oz (88.1 kg). Body mass index is 29.97 kg/m.  Constitutional: not in acute distress, well nourished. Respiratory: lung sounds clear. CV: S1S2 auscultated, no adventitious heart sounds. MS:  When sitting he leans toward his right side and supports his left  lower back with his left arm.  Appears uncomfortable while sitting.  Tenderness of left hip and lower back on palpation.  Straight leg raise positive for left leg, negative for right.  Normal ROM left hip. Spine flexion smooth and without pain, able to touch toes.  Difficulty walking on toes on left side.  See MRI lumbar spine report from 07/03/2009. Neuro:  Alert; patellar reflex 2+ bilaterally.  Achilles reflex 1+ bilaterally.  Psych: Attentive, mildly anxious.  No thoughts of harming self or others. Thoughts logical, coherent and relevant. Dressed appropriately. Making good eye contact.      Assessment & Plan:    Problem List Items Addressed This Visit      Nervous and Auditory   Substance or medication-induced bipolar and related disorder with onset during intoxication (HCC) - Primary     Other   Chronic back pain   Relevant Orders   MR Lumbar Spine Wo Contrast   Insomnia     Meds ordered this encounter  Medications  . traZODone (DESYREL) 50 MG tablet    Sig: One po qhs x 7 d then 2 po qhs    Dispense:  60 tablet    Refill:  2    Order Specific Question:   Supervising Provider    Answer:   Merlyn Albert [[id number]]    Diagnostic tests: MRI lumbar spine without contrast  Patient education:  Counseled on the importance of following up with a psychiatrist on 04/18/17 regarding his psychiatric medicine.  Will restart trazodone 50 mg today for sleep, after one week increase to 100 mg.  If symptoms do not improve after two weeks call the office, sooner if problems. Further follow up based on MRI results.  Return if symptoms worsen or fail to improve.

## 2017-03-10 ENCOUNTER — Encounter: Payer: Self-pay | Admitting: Nurse Practitioner

## 2017-03-10 NOTE — Progress Notes (Signed)
25 minutes was spent with the patient.  This statement verifies that 25 minutes was indeed spent with the patient. Greater than half the time was spent in discussion, counseling and answering questions  regarding the issues that the patient came in for today as reflected in the diagnosis (s) please refer to documentation for further details.

## 2017-03-15 ENCOUNTER — Telehealth: Payer: Self-pay | Admitting: Family Medicine

## 2017-03-15 NOTE — Telephone Encounter (Signed)
[facility] for pt - need to give pt appt info for MRI & explain that the insurance did not approve the prior auth request & pt will be responsible for paying for scan  [facility] for pt's mom - can only ask mom to have pt call us, there's not a DPR on file (will also need this so that [facility] billing can set up payment plan with mom due to pt is an adult & must give permission for mom to be given info)

## 2017-03-17 ENCOUNTER — Ambulatory Visit (HOSPITAL_COMMUNITY)
Admission: RE | Admit: 2017-03-17 | Discharge: 2017-03-17 | Disposition: A | Discharge status: Home / Self Care | Payer: Self-pay | Source: Ambulatory Visit | Attending: Nurse Practitioner | Admitting: Nurse Practitioner

## 2017-03-17 DIAGNOSIS — M5126 Other intervertebral disc displacement, lumbar region: Secondary | ICD-10-CM | POA: Insufficient documentation

## 2017-03-17 DIAGNOSIS — M545 Low back pain: Secondary | ICD-10-CM | POA: Diagnosis not present

## 2017-03-17 DIAGNOSIS — G8929 Other chronic pain: Secondary | ICD-10-CM | POA: Insufficient documentation

## 2017-03-17 DIAGNOSIS — M4316 Spondylolisthesis, lumbar region: Secondary | ICD-10-CM | POA: Insufficient documentation

## 2017-03-17 DIAGNOSIS — M5442 Lumbago with sciatica, left side: Secondary | ICD-10-CM | POA: Insufficient documentation

## 2017-03-21 ENCOUNTER — Other Ambulatory Visit: Payer: Self-pay | Admitting: Nurse Practitioner

## 2017-03-21 ENCOUNTER — Telehealth: Payer: Self-pay | Admitting: Family Medicine

## 2017-03-21 DIAGNOSIS — M545 Low back pain: Secondary | ICD-10-CM

## 2017-03-21 MED ORDER — GABAPENTIN 300 MG PO CAPS: 300.0000 mg | ORAL_CAPSULE | Freq: Two times a day (BID) | ORAL | 2 refills | Status: DC

## 2017-03-21 MED ORDER — CYCLOBENZAPRINE HCL 10 MG PO TABS: 10.0000 mg | ORAL_TABLET | Freq: Three times a day (TID) | ORAL | 0 refills | Status: DC | PRN

## 2017-03-21 MED ORDER — PREDNISONE 20 MG PO TABS: ORAL_TABLET | ORAL | 0 refills | Status: DC

## 2017-03-21 NOTE — Telephone Encounter (Signed)
Spoke with pt, MRI done 03/17/17

## 2017-03-21 NOTE — Telephone Encounter (Signed)
Caller name: Lynden Ang Relationship to patient: Mother DPR: NO Call back Number: Pharmacy: Last visit: 03/09/17   Reason for call: Patients mother called wanting to know the results of the MRI informed her that due to HIPPA we were not able to give her those. I informed her we would need to speak directly to the patient regarding his care until the DPR is on file she is listed as his emergency contact. She reports that the patient is in severe pain and having back spasms. She is asking if he can have anything for pain. I let her know I would forward the request back to the nurse she said she would call back at 1pm with patient on the line to get results. Per mom he would like to know next steps and want to get some relief of pain asking Eber Jones to prescribe something until he can get in to see who ever we recommend.

## 2017-03-21 NOTE — Telephone Encounter (Signed)
Will call in medication for back spasms and nerve pain. Let us know by the end of the week if no improvement. Please put in referral to neurosurgeon of his choice. Thanks.

## 2017-03-21 NOTE — Telephone Encounter (Signed)
Patient advised per Philip Thompson #1. His MRI is still showing bulging discs with some compression of a nerve. Recommend referral to neurologist of his choice. #2. He has a large amount of stool in the colon (constipation). Recommend he take Miralax or other OTC product to get bowels cleaned out. Patient verbalized understanding. Referral ordered in Epic.

## 2017-03-21 NOTE — Telephone Encounter (Signed)
Please see MRI report. We have tried to get in touch with them about his results.

## 2017-03-23 ENCOUNTER — Encounter: Payer: Self-pay | Admitting: Family Medicine

## 2017-03-25 ENCOUNTER — Inpatient Hospital Stay (HOSPITAL_COMMUNITY)
Admission: AD | Admit: 2017-03-25 | Discharge: 2017-03-29 | DRG: 885 | Disposition: A | Discharge status: Home / Self Care | Payer: BLUE CROSS/BLUE SHIELD | Source: Intra-hospital | Attending: Psychiatry | Admitting: Psychiatry

## 2017-03-25 ENCOUNTER — Encounter (HOSPITAL_COMMUNITY): Payer: Self-pay

## 2017-03-25 ENCOUNTER — Emergency Department (HOSPITAL_COMMUNITY)
Admission: EM | Admit: 2017-03-25 | Discharge: 2017-03-25 | Disposition: A | Discharge status: Other Acute Inpatient Hospital | Payer: Self-pay | Attending: Emergency Medicine | Admitting: Emergency Medicine

## 2017-03-25 ENCOUNTER — Encounter (HOSPITAL_COMMUNITY): Payer: Self-pay | Admitting: *Deleted

## 2017-03-25 DIAGNOSIS — F1293 Cannabis use, unspecified with withdrawal: Secondary | ICD-10-CM | POA: Insufficient documentation

## 2017-03-25 DIAGNOSIS — J309 Allergic rhinitis, unspecified: Secondary | ICD-10-CM | POA: Diagnosis present

## 2017-03-25 DIAGNOSIS — M62838 Other muscle spasm: Secondary | ICD-10-CM | POA: Diagnosis present

## 2017-03-25 DIAGNOSIS — F1994 Other psychoactive substance use, unspecified with psychoactive substance-induced mood disorder: Secondary | ICD-10-CM | POA: Diagnosis present

## 2017-03-25 DIAGNOSIS — Z811 Family history of alcohol abuse and dependence: Secondary | ICD-10-CM | POA: Diagnosis not present

## 2017-03-25 DIAGNOSIS — Z810 Family history of intellectual disabilities: Secondary | ICD-10-CM | POA: Diagnosis not present

## 2017-03-25 DIAGNOSIS — G4700 Insomnia, unspecified: Secondary | ICD-10-CM | POA: Diagnosis present

## 2017-03-25 DIAGNOSIS — F988 Other specified behavioral and emotional disorders with onset usually occurring in childhood and adolescence: Secondary | ICD-10-CM | POA: Diagnosis not present

## 2017-03-25 DIAGNOSIS — F332 Major depressive disorder, recurrent severe without psychotic features: Secondary | ICD-10-CM | POA: Diagnosis not present

## 2017-03-25 DIAGNOSIS — Z809 Family history of malignant neoplasm, unspecified: Secondary | ICD-10-CM

## 2017-03-25 DIAGNOSIS — F1910 Other psychoactive substance abuse, uncomplicated: Secondary | ICD-10-CM | POA: Diagnosis present

## 2017-03-25 DIAGNOSIS — R45851 Suicidal ideations: Secondary | ICD-10-CM | POA: Diagnosis present

## 2017-03-25 DIAGNOSIS — F1290 Cannabis use, unspecified, uncomplicated: Secondary | ICD-10-CM | POA: Diagnosis not present

## 2017-03-25 DIAGNOSIS — R440 Auditory hallucinations: Secondary | ICD-10-CM | POA: Diagnosis not present

## 2017-03-25 DIAGNOSIS — Z833 Family history of diabetes mellitus: Secondary | ICD-10-CM | POA: Diagnosis not present

## 2017-03-25 DIAGNOSIS — M1990 Unspecified osteoarthritis, unspecified site: Secondary | ICD-10-CM | POA: Diagnosis not present

## 2017-03-25 DIAGNOSIS — F209 Schizophrenia, unspecified: Secondary | ICD-10-CM | POA: Diagnosis present

## 2017-03-25 DIAGNOSIS — F3110 Bipolar disorder, current episode manic without psychotic features, unspecified: Secondary | ICD-10-CM | POA: Insufficient documentation

## 2017-03-25 DIAGNOSIS — Z79899 Other long term (current) drug therapy: Secondary | ICD-10-CM | POA: Diagnosis not present

## 2017-03-25 DIAGNOSIS — F17210 Nicotine dependence, cigarettes, uncomplicated: Secondary | ICD-10-CM | POA: Diagnosis not present

## 2017-03-25 DIAGNOSIS — F1110 Opioid abuse, uncomplicated: Secondary | ICD-10-CM | POA: Insufficient documentation

## 2017-03-25 DIAGNOSIS — F3111 Bipolar disorder, current episode manic without psychotic features, mild: Secondary | ICD-10-CM

## 2017-03-25 LAB — SALICYLATE LEVEL: Salicylate Lvl: <7 mg/dL (ref 2.8–30.0)

## 2017-03-25 LAB — COMPREHENSIVE METABOLIC PANEL
ALBUMIN: 4.1 g/dL (ref 3.5–5.0)
ALT: 12 U/L — AB (ref 17–63)
AST: 18 U/L (ref 15–41)
Alkaline Phosphatase: 70 U/L (ref 38–126)
Anion gap: 12 (ref 5–15)
BUN: 11 mg/dL (ref 6–20)
CHLORIDE: 97 mmol/L — AB (ref 101–111)
CO2: 26 mmol/L (ref 22–32)
CREATININE: 0.82 mg/dL (ref 0.61–1.24)
Calcium: 9.4 mg/dL (ref 8.9–10.3)
GFR calc Af Amer: >60 mL/min (ref >60)
GFR calc non Af Amer: >60 mL/min (ref >60)
GLUCOSE: 106 mg/dL — AB (ref 65–99)
POTASSIUM: 4.2 mmol/L (ref 3.5–5.1)
Sodium: 135 mmol/L (ref 135–145)
Total Bilirubin: 0.7 mg/dL (ref 0.3–1.2)
Total Protein: 7.2 g/dL (ref 6.5–8.1)

## 2017-03-25 LAB — CBC
HEMATOCRIT: 38.7 % — AB (ref 39.0–52.0)
HEMOGLOBIN: 12.7 g/dL — AB (ref 13.0–17.0)
MCH: 27.7 pg (ref 26.0–34.0)
MCHC: 32.8 g/dL (ref 30.0–36.0)
MCV: 84.3 fL (ref 78.0–100.0)
Platelets: 169 10*3/uL (ref 150–400)
RBC: 4.59 MIL/uL (ref 4.22–5.81)
RDW: 14.5 % (ref 11.5–15.5)
WBC: 11 10*3/uL — AB (ref 4.0–10.5)

## 2017-03-25 LAB — ETHANOL: Alcohol, Ethyl (B): <10 mg/dL (ref <10)

## 2017-03-25 LAB — RAPID URINE DRUG SCREEN, HOSP PERFORMED
AMPHETAMINES: POSITIVE — AB
BENZODIAZEPINES: POSITIVE — AB
Barbiturates: NOT DETECTED
Cocaine: NOT DETECTED
Opiates: POSITIVE — AB
TETRAHYDROCANNABINOL: POSITIVE — AB

## 2017-03-25 LAB — ACETAMINOPHEN LEVEL: Acetaminophen (Tylenol), Serum: <10 ug/mL — ABNORMAL LOW (ref 10–30)

## 2017-03-25 LAB — VALPROIC ACID LEVEL: VALPROIC ACID LVL: 31 ug/mL — AB (ref 50.0–100.0)

## 2017-03-25 MED ORDER — NICOTINE 21 MG/24HR TD PT24
MEDICATED_PATCH | TRANSDERMAL | Status: AC
  Filled 2017-03-25: qty 1

## 2017-03-25 MED ORDER — QUETIAPINE FUMARATE 300 MG PO TABS
300.0000 mg | ORAL_TABLET | Freq: Every day | ORAL | Status: DC
  Administered 2017-03-26 – 2017-03-28 (×3): 300 mg via ORAL
  Filled 2017-03-25 (×4): qty 1
  Filled 2017-03-25: qty 7
  Filled 2017-03-25 (×2): qty 1

## 2017-03-25 MED ORDER — ALUM & MAG HYDROXIDE-SIMETH 200-200-20 MG/5ML PO SUSP: 30.0000 mL | ORAL | Status: DC | PRN

## 2017-03-25 MED ORDER — DIVALPROEX SODIUM 500 MG PO DR TAB
500.0000 mg | DELAYED_RELEASE_TABLET | Freq: Two times a day (BID) | ORAL | Status: DC
  Administered 2017-03-26 – 2017-03-29 (×7): 500 mg via ORAL
  Filled 2017-03-25 (×5): qty 1
  Filled 2017-03-25: qty 14
  Filled 2017-03-25: qty 1
  Filled 2017-03-25: qty 14
  Filled 2017-03-25 (×6): qty 1

## 2017-03-25 MED ORDER — TRAZODONE HCL 50 MG PO TABS: 50.0000 mg | ORAL_TABLET | Freq: Every evening | ORAL | Status: DC | PRN

## 2017-03-25 MED ORDER — GABAPENTIN 300 MG PO CAPS
300.0000 mg | ORAL_CAPSULE | Freq: Two times a day (BID) | ORAL | Status: DC
  Administered 2017-03-25 – 2017-03-28 (×6): 300 mg via ORAL
  Filled 2017-03-25 (×12): qty 1

## 2017-03-25 MED ORDER — ACETAMINOPHEN 325 MG PO TABS
650.0000 mg | ORAL_TABLET | Freq: Four times a day (QID) | ORAL | Status: DC | PRN
  Administered 2017-03-27 – 2017-03-29 (×3): 650 mg via ORAL
  Filled 2017-03-25 (×3): qty 2

## 2017-03-25 MED ORDER — MAGNESIUM HYDROXIDE 400 MG/5ML PO SUSP: 30.0000 mL | Freq: Every day | ORAL | Status: DC | PRN

## 2017-03-25 MED ORDER — HYDROXYZINE HCL 25 MG PO TABS
25.0000 mg | ORAL_TABLET | Freq: Four times a day (QID) | ORAL | Status: DC | PRN
  Administered 2017-03-25 – 2017-03-26 (×2): 25 mg via ORAL
  Filled 2017-03-25 (×2): qty 1

## 2017-03-25 NOTE — BH Assessment (Addendum)
Tele Assessment Note   Patient Name: Philip Thompson MRN: [id number] Referring Physician: Devoria Albe, MD Location of Patient: [facility] Location of Provider: Behavioral Health TTS Department  Philip Thompson is an 27 y.o. male who presents to the ED under IVC initiated by his father. According to the IVC, the respondent "has been doing some type of drugs. He is hearing voices and he talks in some strange language. Respondent said today that he was going to hang himself or overdose on something. He has attempted suicide before. He has also cut himself in the past. Respondent also mixes alcohol with drugs. He has been diagnosed with Bipolar and Schizophrenia. He has lost weight recently and he does not sleep properly. Personal hygiene is suffering as well."  TTS spoke with the pt's mother in order to obtain collateral information. [name] mother states the incident began on Wednesday, Mom states the pt was talking to himself. Mom reports on Thursday things escalated and the pt made threats to kill himself, continued talking to himself, experiencing VH, and mom was afraid the pt was going to harm himself. Mom reports the pt told her that he was going to kill himself and was talking to her on the phone as if he was standing in the same room with him. Pt reportedly said father was in the other room going through his belongings at that exact moment and stealing his things, however his father was not in the home. [name] mother reports that the pt said many times yesterday that he wanted to kill himself, became combative, and she thought he was going to hurt father. Mom states the pt was crying hysterically with her on the phone and told her that he wished he was dead and did not exist. Mom states the pt has attempted suicide in the past and currently does not have an OPT provider. Pt reportedly has an upcoming appt with BH - OPT on April 1st.   During the assessment the pt appeared anxious. The pt responded to this  writer's questions with irrelevant statements. Pt was asked to identify his highest level of education and pt began speaking rapidly and incoherently for several minutes until he stopped speaking abruptly and placed his hand on his head. Pt was asked if he has used any drugs PTA and pt declined. This Clinical research associate asked the pt why he believes his labs are currently positive for opiates, Benzodiazepines, Amphetamines, and cannabis and pt stated "I don't know." Pt appears to me minimizing his substance abuse. Upon review of the pt's chart, he has an extensive hx of substance abuse and inpt admissions to [facility] c/o similar concerns. Pt has been IVC'd by his mother in the past. Most recent TTS assessment took place on 10/15/16, At that time the pt was IVC'd by his mother due to stealing his fathers pain medications, hx of suicide attempts, and hx of violence and aggression towards his family. Pt vehemently denies this during the assessment. Pt states he does not want TTS to contact his family because he does not trust his mother because "she's the one who put me in here."  Per Nira Conn, NP pt is recommended for inpt treatment, [facility] bed availability pending per [facility]. [name] nurse Claris Gower, RN has been advised of the disposition and states she will advise EDP Devoria Albe, MD of the disposition.   Diagnosis: Bipolar I Disorder, Current Episode Manic, Severe With Psychotic Features; Opioid use disorder, severe; Cannabis use disorder, severe  Past Medical History:  Past Medical History:  Diagnosis Date  . ADD (attention deficit disorder)   . Allergic rhinitis   . Anxiety   . Arthritis    joint pain  . Frontal head injury   . Heroin overdose [facility])     Past Surgical History:  Procedure Laterality Date  . FOOT SURGERY     to get a BB out  . NO PAST SURGERIES      Family History:  Family History  Problem Relation Age of Onset  . Dementia Father   . Alcohol abuse Father   . Diabetes Brother   . Cancer -  Ovarian Maternal Grandmother   . Cancer - Lung Paternal Grandmother   . Cancer Paternal Grandfather   . Diabetes Other   . Hypertension Other   . Heart disease Other   . Heart attack Other     Social History:  reports that he has been smoking cigarettes.  He has a 7.00 pack-year smoking history. he has never used smokeless tobacco. He reports that he drinks alcohol. He reports that he uses drugs. Drug: Marijuana.  Additional Social History:  Alcohol / Drug Use Pain Medications: See MAR Prescriptions: See MAR Over the Counter: See MAR History of alcohol / drug use?: Yes Longest period of sobriety (when/how long): unknown, pt denies abuse even though chart shows severe substance abuse  Substance #1 Name of Substance 1: Cannabis  1 - Age of First Use: unknown, pt is denying use even though labs are positive on arrival to ED 1 - Last Use / Amount: unknown, pt is denying use even though labs are positive on arrival to ED Substance #2 Name of Substance 2: Benzos 2 - Age of First Use: unknown, pt is denying use even though labs are positive on arrival to ED 2 - Last Use / Amount: unknown, pt is denying use even though labs are positive on arrival to ED Substance #3 Name of Substance 3: Opiates  3 - Age of First Use: unknown, pt is denying use even though labs are positive on arrival to ED 3 - Last Use / Amount: unknown, pt is denying use even though labs are positive on arrival to ED Substance #4 Name of Substance 4: Amphetamine  4 - Age of First Use: unknown, pt is denying use even though labs are positive on arrival to ED 4 - Last Use / Amount: unknown, pt is denying use even though labs are positive on arrival to ED  CIWA: CIWA-Ar BP: 138/85 Pulse Rate: (!) 115 COWS:    Allergies: No Known Allergies  Home Medications:  (Not in a hospital admission)  OB/GYN Status:  No LMP for male patient.  General Assessment Data Location of Assessment: [facility] TTS Assessment: In system Is  this a Tele or Face-to-Face Assessment?: Tele Assessment Is this an Initial Assessment or a Re-assessment for this encounter?: Initial Assessment Marital status: Single Is patient pregnant?: No Pregnancy Status: No Living Arrangements: Parent Can pt return to current living arrangement?: Yes Admission Status: Involuntary Is patient capable of signing voluntary admission?: No Referral Source: Self/Family/Friend Insurance type: none     Crisis Care Plan Living Arrangements: Parent Name of Psychiatrist: none reported Name of Therapist: none reported  Education Status Is patient currently in school?: No Is the patient employed, unemployed or receiving disability?: Unemployed  Risk to self with the past 6 months Suicidal Ideation: Yes-Currently Present(per reports from collateral ) Has patient been a risk to self within the past 6  months prior to admission? : Yes Suicidal Intent: No-Not Currently/Within Last 6 Months Has patient had any suicidal intent within the past 6 months prior to admission? : Yes Is patient at risk for suicide?: Yes Suicidal Plan?: No-Not Currently/Within Last 6 Months Has patient had any suicidal plan within the past 6 months prior to admission? : Yes Access to Means: (unknown) What has been your use of drugs/alcohol within the last 12 months?: pt denies use, labs are positive for cannabis, opiates, amphetamines, and benzos. Possibly prescribed medication,  Previous Attempts/Gestures: Yes How many times?: 3(per reports from collateral ) Triggers for Past Attempts: Unpredictable Intentional Self Injurious Behavior: None Family Suicide History: No Recent stressful life event(s): Other (Comment), Conflict (Comment)(ongoing substance abuse per chart ) Persecutory voices/beliefs?: No Depression: Yes Depression Symptoms: Tearfulness, Feeling angry/irritable, Feeling worthless/self pity, Isolating Substance abuse history and/or treatment for substance abuse?:  Yes Suicide prevention information given to non-admitted patients: Not applicable  Risk to Others within the past 6 months Homicidal Ideation: No Does patient have any lifetime risk of violence toward others beyond the six months prior to admission? : Yes (comment)(per reports from collateral ) Thoughts of Harm to Others: No-Not Currently Present/Within Last 6 Months Current Homicidal Intent: No Current Homicidal Plan: No Access to Homicidal Means: No History of harm to others?: Yes Assessment of Violence: In past 6-12 months Violent Behavior Description: per reports from collateral and chart review, pt has been aggressive and violent with family in the past  Does patient have access to weapons?: No Criminal Charges Pending?: No Does patient have a court date: No Is patient on probation?: No  Psychosis Hallucinations: Visual(per reports from collateral ) Delusions: None noted  Mental Status Report Appearance/Hygiene: In scrubs, Bizarre Eye Contact: Poor Motor Activity: Restlessness Speech: Incoherent, Pressured, Slurred Level of Consciousness: Restless Mood: Anxious, Labile, Helpless, Depressed Affect: Anxious, Depressed Anxiety Level: Severe Thought Processes: Thought Blocking, Irrelevant Judgement: Impaired Orientation: Person, Place, Time, Appropriate for developmental age Obsessive Compulsive Thoughts/Behaviors: None  Cognitive Functioning Concentration: Decreased Memory: Remote Intact, Recent Intact Is patient IDD: No Is patient DD?: No Insight: Poor Impulse Control: Poor Appetite: Good Have you had any weight changes? : No Change Sleep: No Change Total Hours of Sleep: 9 Vegetative Symptoms: None  ADLScreening [facility] Assessment Services) Patient's cognitive ability adequate to safely complete daily activities?: Yes Patient able to express need for assistance with ADLs?: Yes Independently performs ADLs?: Yes (appropriate for developmental age)  Prior Inpatient  Therapy Prior Inpatient Therapy: Yes Prior Therapy Dates: 2018 Prior Therapy Facilty/Provider(s): [facility] Reason for Treatment: SUBSTANCE ABUSE, OVERDOSE  Prior Outpatient Therapy Prior Outpatient Therapy: Yes Prior Therapy Dates: 2018 Prior Therapy Facilty/Provider(s): [facility] Reason for Treatment: med management  Does patient have an ACCT team?: No Does patient have Intensive In-House Services?  : No Does patient have [facility] services? : No Does patient have P4CC services?: No  ADL Screening (condition at time of admission) Patient's cognitive ability adequate to safely complete daily activities?: Yes Is the patient deaf or have difficulty hearing?: No Does the patient have difficulty seeing, even when wearing glasses/contacts?: No Does the patient have difficulty concentrating, remembering, or making decisions?: Yes Patient able to express need for assistance with ADLs?: Yes Does the patient have difficulty dressing or bathing?: No Independently performs ADLs?: Yes (appropriate for developmental age) Does the patient have difficulty walking or climbing stairs?: No Weakness of Legs: None Weakness of Arms/Hands: None  Home Assistive Devices/Equipment Home Assistive Devices/Equipment: None  Abuse/Neglect Assessment (Assessment to be complete while patient is alone) Abuse/Neglect Assessment Can Be Completed: Yes Physical Abuse: Denies Verbal Abuse: Denies Sexual Abuse: Denies Exploitation of patient/patient's resources: Denies Self-Neglect: Denies     Merchant navy officer (For Healthcare) Does Patient Have a Medical Advance Directive?: No Would patient like information on creating a medical advance directive?: No - Patient declined    Additional Information 1:1 In Past 12 Months?: No CIRT Risk: No Elopement Risk: No Does patient have medical clearance?: Yes     Disposition: Per Nira Conn, NP pt is recommended for inpt treatment, [facility] bed availability pending per  [facility]. [name] nurse Claris Gower, RN has been advised of the disposition and states she will advise EDP Devoria Albe, MD of the disposition.   Disposition Initial Assessment Completed for this Encounter: Yes Disposition of Patient: Admit Type of inpatient treatment program: Adult(per Nira Conn, NP) Patient refused recommended treatment: No  This service was provided via telemedicine using a 2-way, interactive audio and video technology.  Names of all persons participating in this telemedicine service and their role in this encounter. Name: JAIS DEMIR Role: Patient   Name: Princess Bruins Role: TTS          Karolee Ohs 03/25/2017 5:38 AM

## 2017-03-25 NOTE — ED Notes (Signed)
Report to Boyd Kerbs RN, [facility] Adult unit
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
## 2017-03-25 NOTE — ED Notes (Addendum)
Pt given phone with instructions for five minute phone call allowed.- pt unable to get in contact with anyone

## 2017-03-25 NOTE — Progress Notes (Signed)
Patient did not attend AA group meeting. 

## 2017-03-25 NOTE — Progress Notes (Signed)
TTS spoke with pt's mother, [name] at [phone] who states the pt has [employer] and she has a copy of his insurance card if needed.  Princess Bruins, MSW, LCSW Therapeutic Triage Specialist  [phone]

## 2017-03-25 NOTE — Progress Notes (Signed)
Pt accepted to [facility] Bed 302-1  Celesta Gentile, is the accepting provider.  Dr. Jama Flavors is the attending provider.  Call report to [phone]  Deanna, RN @  [facility] notified.   Pt is IVC.  Pt may be transported by [facility]. Pt is scheduled to arrive at [facility] as soon as transport can be arranged.  Timmothy Euler. Kaylyn Lim, MSW, LCSWA Disposition Clinical Social Work [phone] (cell) [phone] (office)

## 2017-03-25 NOTE — ED Notes (Signed)
Pt standing at doorway cussing . He believes he has heard his fathers truck and mothers voice. Attempt to reorient, unable to reorient. Security called

## 2017-03-25 NOTE — ED Triage Notes (Signed)
Pt in by [employer] with IVC papers.  Pt states he thinks his mother took out papers on him because he was arguing with and yelling at his father.  IVC papers state pt stated he wanted to hang himself or overdose, but patient denies this and states "she always does this to me"   Pt is very wide eyed and appears anxious, denies recent drug use or abuse.

## 2017-03-25 NOTE — ED Notes (Signed)
Pt accepted at [facility] Rm 302. Accepting Nira Conn

## 2017-03-25 NOTE — Progress Notes (Signed)
Nursing Progress Note: 7p-7a D: Pt currently presents with a flat/bizarre/isolative affect and behavior. Pt states "I'm not going out of this bed." Not interacting with the milieu. Pt reports fair sleep during the previous night with current medication regimen. Pt did not attend wrap-up group.  A: Pt refused medications per providers orders. Pt bathroom covered in feces. Pt smeared fecal matter on floor and toilet with towels. [name] labs and vitals were monitored throughout the night. Pt supported emotionally and encouraged to express concerns and questions. Pt educated on medications.  R: [name] safety ensured with 15 minute and environmental checks. Pt currently denies SI, HI, and AVH. Pt verbally contracts to seek staff if SI,HI, or AVH occurs and to consult with staff before acting on any harmful thoughts. Will continue to monitor.

## 2017-03-25 NOTE — Progress Notes (Signed)
Per Nira Conn, NP pt is recommended for inpt treatment, [facility] bed availability pending per [facility]. [name] nurse Claris Gower, RN has been advised of the disposition and states she will advise EDP Devoria Albe, MD of the disposition.   Princess Bruins, MSW, LCSW Therapeutic Triage Specialist  [phone]

## 2017-03-25 NOTE — ED Notes (Signed)
Spoke with [facility]- recommending inpatient tx and report they will probably have a bed this morning after some discharges.

## 2017-03-25 NOTE — ED Provider Notes (Signed)
[facility] EMERGENCY DEPARTMENT Provider Note   CSN: [id number] Arrival date & time: 03/25/17  0018  Time seen 02:10 AM   History   Chief Complaint Chief Complaint  Patient presents with  . Medical Clearance  . Addiction Problem   Level 5 caveat for psychiatric illness   HPI Philip Thompson is a 27 y.o. male.  HPI patient was brought in by the police with IVC papers that his mother took out on him.  Patient states his father has dementia and had a heart attack 3 weeks ago.  He states his father is no longer allowed to drive however his father did not understand that.  He states he has been fussing with his father for the past 3 days.  He states tonight his mother walked in and did not understand what they were fussing about.  He denies saying anything about harming himself.  He denies arguments getting physical.  However patient is very difficult to get history from.  When I asked him questions he will start answering about something else.  For instance he states that he wants to be on [employer] because it keeps him calm.  That is where he lives.  However we were talking about something else.  I asked him if he is ever done anything to harm himself in the past and he states he fell off a bridge and broke several bones.  When I asked him if he did it on purpose he said no.  Patient states he goes to behavioral health and he states he is taking his medications.  He states his brother recently died from a MI although I could not tell if that is what really happened, he also states his sister died from hepatitis C and drug abuse.  PCP [name], MD Psych  [facility]  Past Medical History:  Diagnosis Date  . ADD (attention deficit disorder)   . Allergic rhinitis   . Anxiety   . Arthritis    joint pain  . Frontal head injury   . Heroin overdose [facility])     Patient Active Problem List   Diagnosis Date Noted  . Polysubstance abuse (HCC) 10/15/2016  . Poor impulse control   .  Stimulant use disorder 10/12/2016  . Substance or medication-induced bipolar and related disorder with onset during intoxication (HCC) 07/05/2016  . Opioid use disorder, moderate, dependence (HCC) 07/05/2016  . Cannabis use disorder, severe, dependence (HCC) 07/05/2016  . Substance induced mood disorder (HCC) 06/30/2016  . Opiate overdose (HCC) 08/28/2013  . Alcohol intoxication (HCC) 08/28/2013  . Physically aggressive behavior 08/28/2013  . Severely aggressive behavior 08/28/2013  . Marijuana abuse 08/28/2013  . Suicidal ideation 08/28/2013  . Homicidal ideation 08/28/2013  . Insomnia 02/20/2013  . Chronic back pain 07/05/2012    Past Surgical History:  Procedure Laterality Date  . FOOT SURGERY     to get a BB out  . NO PAST SURGERIES         Home Medications    Prior to Admission medications   Medication Sig Start Date End Date Taking? Authorizing Provider  cyclobenzaprine (FLEXERIL) 10 MG tablet Take 1 tablet (10 mg total) by mouth 3 (three) times daily as needed for muscle spasms. 03/21/17   Campbell Riches, NP  divalproex (DEPAKOTE) 500 MG DR tablet Take 1 tablet (500 mg total) by mouth every 12 (twelve) hours. For mood stabilization 02/10/17   Campbell Riches, NP  gabapentin (NEURONTIN) 300 MG  capsule Take 1 capsule (300 mg total) by mouth 2 (two) times daily. 03/21/17   Campbell Riches, NP  hydrOXYzine (ATARAX/VISTARIL) 25 MG tablet Take 1 tablet (25 mg total) by mouth every 6 (six) hours as needed for anxiety. 02/10/17   Campbell Riches, NP  predniSONE (DELTASONE) 20 MG tablet 3 po qd x 3 d then 2 po qd x 3 d then 1 po qd x 2 d 03/21/17   Campbell Riches, NP  propranolol (INDERAL) 10 MG tablet Take 1 tablet (10 mg total) by mouth 2 (two) times daily. For anxiety 10/14/16   Armandina Stammer I, NP  QUEtiapine (SEROQUEL) 300 MG tablet Take 1 tablet (300 mg total) by mouth at bedtime. For mood control 02/10/17   Campbell Riches, NP  traZODone (DESYREL) 50 MG tablet One  po qhs x 7 d then 2 po qhs 03/09/17   Campbell Riches, NP    Family History Family History  Problem Relation Age of Onset  . Dementia Father   . Alcohol abuse Father   . Diabetes Brother   . Cancer - Ovarian Maternal Grandmother   . Cancer - Lung Paternal Grandmother   . Cancer Paternal Grandfather   . Diabetes Other   . Hypertension Other   . Heart disease Other   . Heart attack Other     Social History Social History   Tobacco Use  . Smoking status: Current Every Day Smoker    Packs/day: 1.00    Years: 7.00    Pack years: 7.00    Types: Cigarettes  . Smokeless tobacco: Never Used  Substance Use Topics  . Alcohol use: Yes    Comment: hx of. last use 01/2016  . Drug use: Yes    Types: Marijuana    Comment: last use last night  lives with parents   Allergies   Patient has no known allergies.   Review of Systems Review of Systems  Unable to perform ROS: Psychiatric disorder     Physical Exam Updated Vital Signs BP 138/85 (BP Location: Right Arm)   Pulse (!) 115   Temp 98.3 F (36.8 C) (Oral)   Resp 20   Ht 5\' 7"  (1.702 m)   Wt 83.9 kg (185 lb)   SpO2 98%   BMI 28.98 kg/m   Vital signs normal except for tachycardia   Physical Exam  Constitutional: He appears well-developed and well-nourished.  Non-toxic appearance. He does not appear ill. No distress.  HENT:  Head: Normocephalic and atraumatic.  Right Ear: External ear normal.  Left Ear: External ear normal.  Nose: Nose normal. No mucosal edema or rhinorrhea.  Mouth/Throat: Oropharynx is clear and moist and mucous membranes are normal. No dental abscesses or uvula swelling.  Eyes: Conjunctivae and EOM are normal. Pupils are equal, round, and reactive to light.  Neck: Normal range of motion and full passive range of motion without pain. Neck supple.  Cardiovascular: Normal rate, regular rhythm and normal heart sounds. Exam reveals no gallop and no friction rub.  No murmur heard. Pulmonary/Chest:  Effort normal and breath sounds normal. No respiratory distress. He has no wheezes. He has no rhonchi. He has no rales. He exhibits no tenderness and no crepitus.  Abdominal: Soft. Normal appearance and bowel sounds are normal. He exhibits no distension. There is no tenderness. There is no rebound and no guarding.  Musculoskeletal: Normal range of motion. He exhibits no edema or tenderness.  Moves all extremities well.  Neurological: He is alert. He has normal strength. No cranial nerve deficit.  Patient states it is February and that it is Saturday, both are wrong, he did get the correct year  Skin: Skin is warm, dry and intact. No rash noted. No erythema. No pallor.  Psychiatric: His mood appears anxious. His speech is rapid and/or pressured and tangential.  Although patient appears to be cooperative his thought processes very disorganized.  He jumps around from topic to topic and does not always answer the question that is being asked of him.  His speech at times can be rapid and soft and hard to understand.  Nursing note and vitals reviewed.    ED Treatments / Results  Labs (all labs ordered are listed, but only abnormal results are displayed) Results for orders placed or performed during the hospital encounter of 03/25/17  Comprehensive metabolic panel  Result Value Ref Range   Sodium 135 135 - 145 mmol/L   Potassium 4.2 3.5 - 5.1 mmol/L   Chloride 97 (L) 101 - 111 mmol/L   CO2 26 22 - 32 mmol/L   Glucose, Bld 106 (H) 65 - 99 mg/dL   BUN 11 6 - 20 mg/dL   Creatinine, Ser 9.20 0.61 - 1.24 mg/dL   Calcium 9.4 8.9 - 10.0 mg/dL   Total Protein 7.2 6.5 - 8.1 g/dL   Albumin 4.1 3.5 - 5.0 g/dL   AST 18 15 - 41 U/L   ALT 12 (L) 17 - 63 U/L   Alkaline Phosphatase 70 38 - 126 U/L   Total Bilirubin 0.7 0.3 - 1.2 mg/dL   GFR calc non Af Amer >60 >60 mL/min   GFR calc Af Amer >60 >60 mL/min   Anion gap 12 5 - 15  Ethanol  Result Value Ref Range   Alcohol, Ethyl (B) <10 <10 mg/dL    Salicylate level  Result Value Ref Range   Salicylate Lvl <7.0 2.8 - 30.0 mg/dL  Acetaminophen level  Result Value Ref Range   Acetaminophen (Tylenol), Serum <10 (L) 10 - 30 ug/mL  cbc  Result Value Ref Range   WBC 11.0 (H) 4.0 - 10.5 K/uL   RBC 4.59 4.22 - 5.81 MIL/uL   Hemoglobin 12.7 (L) 13.0 - 17.0 g/dL   HCT 71.2 (L) 19.7 - 58.8 %   MCV 84.3 78.0 - 100.0 fL   MCH 27.7 26.0 - 34.0 pg   MCHC 32.8 30.0 - 36.0 g/dL   RDW 32.5 49.8 - 26.4 %   Platelets 169 150 - 400 K/uL  Rapid urine drug screen (hospital performed)  Result Value Ref Range   Opiates POSITIVE (A) NONE DETECTED   Cocaine NONE DETECTED NONE DETECTED   Benzodiazepines POSITIVE (A) NONE DETECTED   Amphetamines POSITIVE (A) NONE DETECTED   Tetrahydrocannabinol POSITIVE (A) NONE DETECTED   Barbiturates NONE DETECTED NONE DETECTED  Valproic acid level  Result Value Ref Range   Valproic Acid Lvl 31 (L) 50.0 - 100.0 ug/mL   Laboratory interpretation all normal except positive UDS, subtherapeutic valproic acid, leukocytosis    EKG  EKG Interpretation None       Radiology No results found.  Procedures Procedures (including critical care time)  Medications Ordered in ED Medications - No data to display   Initial Impression / Assessment and Plan / ED Course  I have reviewed the triage vital signs and the nursing notes.  Pertinent labs & imaging results that were available during my care of the patient were reviewed  by me and considered in my medical decision making (see chart for details).    Patient presents with IVC papers initiated by his mother.  Although he does not express suicidal thoughts to me patient does not appear to be mentally normal, he is having tangential thinking and inappropriate response to questions.  3:15 AM after reviewing his laboratory results TTS consult was ordered.  TTS has evaluated patient and recommends inpatient admission.  They also report he has bipolar disorder and  manic state.  They state he can come to behavioral health after 8 AM this morning for admission.  I did the second opinion of his IVC paperwork.  Final Clinical Impressions(s) / ED Diagnoses   Final diagnoses:  Polysubstance abuse (HCC)  Bipolar disord, crnt episode manic w/o psych features, mild [facility])    Plan admission to [facility]  Devoria Albe, MD, Concha Pyo, MD 03/25/17 [phone]

## 2017-03-25 NOTE — ED Notes (Signed)
Communications called to send law enforcement to transport pt to [facility].

## 2017-03-25 NOTE — Progress Notes (Signed)
Pt is 27 yo caucasian male admitted to [facility] today after being IVC'd by his mother ( note says his father) because of drug abuse and inappropriate behaviors. He has a garbled-like speech that sometimes makes it difficult to understand his words. HE says he does not workm, and that he stays at home to care for his father. He denies suicidality in his family but states he has tried to attmept suicide before. HE says he smokes pot and takes " painpills" and his uds is positive for pot, benzodiazepine, and amphetamines. He reports his 2 siblings ( brother and sisiter) died both from drug overdose. At this point, he coptnracts for safety, admission is complete and he is oriented to the unit.

## 2017-03-25 NOTE — Progress Notes (Signed)
Disposition pending review of IVC and collateral information. RN advised that [facility] needs a copy of IVC papers faxed to [phone].  Princess Bruins, MSW, LCSW Therapeutic Triage Specialist  [phone]

## 2017-03-25 NOTE — ED Notes (Signed)
Awaiting transport.

## 2017-03-25 NOTE — ED Notes (Signed)
Pt making nonsensical statements

## 2017-03-25 NOTE — ED Notes (Signed)
ED Provider at bedside. 

## 2017-03-25 NOTE — Progress Notes (Signed)
[facility] Group Notes:  (Nursing/MHT/Case Management/Adjunct)  Date:  03/25/2017  Time:  4:46 PM  Type of Therapy:  Nurse Education  Participation Level:  Active  Participation Quality:  Appropriate  Affect:  Appropriate  Cognitive:  Alert and Oriented  Insight:  Appropriate  Engagement in Group:  Engaged  Modes of Intervention:  Activity, Socialization and Support  Summary of Progress/Problems:The purpose of this group is to educate and support patients on the benefits of relaxation techniques.   Beatrix Shipper 03/25/2017, 4:46 PM

## 2017-03-25 NOTE — ED Notes (Signed)
Call to [facility] [phone] for transport

## 2017-03-25 NOTE — Tx Team (Signed)
Initial Treatment Plan 03/25/2017 6:46 PM IZREAL PU [id number]    PATIENT STRESSORS: Educational concerns Financial difficulties Health problems   PATIENT STRENGTHS: Ability for insight Active sense of humor Average or above average intelligence   PATIENT IDENTIFIED PROBLEMS: MDD with SI  Polysubstance Abuse            " It could always be worse"  " I am strong enough"     DISCHARGE CRITERIA:  Ability to meet basic life and health needs Adequate post-discharge living arrangements Improved stabilization in mood, thinking, and/or behavior  PRELIMINARY DISCHARGE PLAN: Attend aftercare/continuing care group Attend PHP/IOP Attend 12-step recovery group  PATIENT/FAMILY INVOLVEMENT: This treatment plan has been presented to and reviewed with the patient, [facility], and/or family member.  The patient and family have been given the opportunity to ask questions and make suggestions.  Rich Brave, RN 03/25/2017, 6:46 PM

## 2017-03-26 DIAGNOSIS — F332 Major depressive disorder, recurrent severe without psychotic features: Principal | ICD-10-CM

## 2017-03-26 DIAGNOSIS — F1994 Other psychoactive substance use, unspecified with psychoactive substance-induced mood disorder: Secondary | ICD-10-CM

## 2017-03-26 DIAGNOSIS — R440 Auditory hallucinations: Secondary | ICD-10-CM

## 2017-03-26 MED ORDER — NAPROXEN 500 MG PO TABS
500.0000 mg | ORAL_TABLET | Freq: Two times a day (BID) | ORAL | Status: DC | PRN
  Administered 2017-03-26 – 2017-03-27 (×4): 500 mg via ORAL
  Filled 2017-03-26 (×4): qty 1

## 2017-03-26 MED ORDER — ONDANSETRON 4 MG PO TBDP
ORAL_TABLET | ORAL | Status: AC
  Filled 2017-03-26: qty 1

## 2017-03-26 MED ORDER — LORAZEPAM 1 MG PO TABS
1.0000 mg | ORAL_TABLET | Freq: Four times a day (QID) | ORAL | Status: DC | PRN
  Administered 2017-03-26 – 2017-03-27 (×3): 1 mg via ORAL
  Filled 2017-03-26 (×3): qty 1

## 2017-03-26 MED ORDER — LORAZEPAM 1 MG PO TABS
1.0000 mg | ORAL_TABLET | Freq: Four times a day (QID) | ORAL | Status: DC | PRN
  Administered 2017-03-26: 1 mg via ORAL
  Filled 2017-03-26: qty 1

## 2017-03-26 MED ORDER — VITAMIN B-1 100 MG PO TABS
100.0000 mg | ORAL_TABLET | Freq: Every day | ORAL | Status: DC
  Administered 2017-03-26 – 2017-03-29 (×4): 100 mg via ORAL
  Filled 2017-03-26 (×7): qty 1

## 2017-03-26 MED ORDER — LORAZEPAM 1 MG PO TABS
ORAL_TABLET | ORAL | Status: AC
  Filled 2017-03-26: qty 1

## 2017-03-26 MED ORDER — METHOCARBAMOL 500 MG PO TABS
ORAL_TABLET | ORAL | Status: AC
  Filled 2017-03-26: qty 1

## 2017-03-26 MED ORDER — METHOCARBAMOL 500 MG PO TABS
500.0000 mg | ORAL_TABLET | Freq: Three times a day (TID) | ORAL | Status: DC | PRN
  Administered 2017-03-26: 500 mg via ORAL

## 2017-03-26 MED ORDER — ADULT MULTIVITAMIN W/MINERALS CH
1.0000 | ORAL_TABLET | Freq: Every day | ORAL | Status: DC
  Administered 2017-03-26 – 2017-03-29 (×4): 1 via ORAL
  Filled 2017-03-26 (×7): qty 1

## 2017-03-26 MED ORDER — LORAZEPAM 1 MG PO TABS
1.0000 mg | ORAL_TABLET | Freq: Once | ORAL | Status: AC
  Administered 2017-03-26: 1 mg via ORAL

## 2017-03-26 MED ORDER — LORAZEPAM 1 MG PO TABS: 1.0000 mg | ORAL_TABLET | Freq: Four times a day (QID) | ORAL | Status: DC | PRN

## 2017-03-26 MED ORDER — HYDROXYZINE HCL 25 MG PO TABS
25.0000 mg | ORAL_TABLET | Freq: Four times a day (QID) | ORAL | Status: DC | PRN
  Administered 2017-03-27 – 2017-03-28 (×4): 25 mg via ORAL
  Filled 2017-03-26: qty 10
  Filled 2017-03-26 (×3): qty 1

## 2017-03-26 MED ORDER — ONDANSETRON 4 MG PO TBDP
4.0000 mg | ORAL_TABLET | Freq: Three times a day (TID) | ORAL | Status: DC | PRN
  Administered 2017-03-26 – 2017-03-27 (×2): 4 mg via ORAL
  Filled 2017-03-26: qty 1

## 2017-03-26 MED ORDER — METHOCARBAMOL 750 MG PO TABS
750.0000 mg | ORAL_TABLET | Freq: Three times a day (TID) | ORAL | Status: DC | PRN
  Administered 2017-03-26 – 2017-03-27 (×2): 750 mg via ORAL
  Filled 2017-03-26 (×2): qty 1

## 2017-03-26 MED ORDER — THIAMINE HCL 100 MG/ML IJ SOLN: 100.0000 mg | Freq: Once | INTRAMUSCULAR | Status: DC

## 2017-03-26 NOTE — [facility] Group Notes (Signed)
[facility] Group Notes:  (Nursing/MHT/Case Management/Adjunct)  Date:  03/26/2017  Time:  3:03 PM  Type of Therapy:  Psychoeducational Skills  Participation Level:  Did Not Attend  Participation Quality:  Did not attend  Affect:  Did not attend  Cognitive:  Did not attend  Insight:  None  Engagement in Group:  Did not attend  Modes of Intervention:  Did not attend  Summary of Progress/Problems: Pt did not attend Psychoeducational group with music.   Jacquelyne Balint Shanta 03/26/2017, 3:03 PM

## 2017-03-26 NOTE — Progress Notes (Signed)
Patient ID: Philip Thompson, male   DOB: 1990/08/26, 27 y.o.   MRN: [id number]     D: pt has been very isolative today on the unit, he remained in his room most of the day. Pt did not attend any groups nor did he engage in any treatment. Pt reported lost of withdrawal symptoms today on the unit, Tanika NP was made aware new orders noted. Pt reported that he felt better after getting medication and that the medication was helping with his withdrawal. Pt reported that he was depressed, hopeless, and that that he was having lots of anxiety. Pt also reported that he was having spasms all over his body, body aches, and was having some N/V.  Pt reported being negative SI/HI, no AH/VH noted. A: 15 min checks continued for patient safety. R: Pt safety maintained.

## 2017-03-26 NOTE — [facility] Group Notes (Signed)
[facility] Group Notes:  (Nursing/MHT/Case Management/Adjunct)  Date:  03/26/2017  Time:  10:25 AM  Type of Therapy:  Psychoeducational Skills  Participation Level:  Did Not Attend  Participation Quality:  Did not attend  Affect:  Did not attend  Cognitive:  Did not attend  Insight:  None  Engagement in Group:  Did not attend  Modes of Intervention:  Did not attend  Summary of Progress/Problems: Pt did not attend Psychoeducational group with topic anger management.   Evolett Somarriba Shanta 03/26/2017, 10:25 AM 

## 2017-03-26 NOTE — B[facility] Counselor (Signed)
Adult Comprehensive Assessment  Patient ID: Philip Thompson, male   DOB: 01-20-1990, 27 y.o.   MRN: [id number]  Information Source: Information source: Patient  Current Stressors:  Education issues:  Denies stressors Employment / Job issues: Denies stressors - self employed as Lawyer Family Relationships:   Father had a stroke 2 weeks ago, and pt takes care of him at night, has been stressful.  Father also has dementia, and the stroke has worsened this coniderably. Financial / Lack of resources (include bankruptcy): Denies stressors Physical health (include injuries &life threatening diseases):  Ongoing pain, just had a MRI with his neurosurgeon last week, they want to put rods and screws in his lower back, but there is a money issue - In 2011 fell 45 feet from a bridge doing pull ups over the edge. "It seemed like a good idea at the time. I was really drunk." Back pain and neck pain-left hip pain.  He has TBI/frontal lobe damage, problems with memory. Social relationships:  Denies stressors, states there are no negative people in his life Substance abuse:  States substance abuse is not a stressor. Admits to smoking cannabis-states he has gone from daily to weekends. Admits to opiate use-buys off of the street-but minimizes. Admits to DUI charge in past, to being drunk when he fell off of the bridge Bereavement/Loss:  Denies stressors  Living/Environment/Situation:  Living Arrangements: Parent, living with dad-parents separated, mom comes into the home several hours a day to help How long has patient lived in current situation?: all my life What is atmosphere in current home: Comfortable, Supportive, Chaotic - Mother takes care of father during the day, pt takes care of him at night  Family History:  Marital status: Single, broke up with girlfriend "still best friend, but no longer romantically involved" Are you sexually active?: Yes What is your sexual  orientation?: straight Does patient have children?: No  Childhood History:  By whom was/is the patient raised?: Both parents Description of patient's relationship with caregiver when they were a child: good Patient's description of current relationship with people who raised him/her: "It's rough. they're fixin to separate Does patient have siblings?: Yes Number of Siblings: 7 Description of patient's current relationship with siblings: all step siblings, 2 died within the past 10 mos-1 died of hepatitis, i died in his sleep Did patient suffer any verbal/emotional/physical/sexual abuse as a child?: No Did patient suffer from severe childhood neglect?: No Has patient ever been sexually abused/assaulted/raped as an adolescent or adult?: No Was the patient ever a victim of a crime or a disaster?: No Witnessed domestic violence?: No Has patient been effected by domestic violence as an adult?: No  Education:  Highest grade of school patient has completed: 12, plus 1 semester Currently a student?: No Learning disability?: No  Employment/Work Situation:  Employment situation: Employed 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 How long has patient been employed?: since age of 48 Patient's job has been impacted by current illness: No What is the longest time patient has a held a job?: see above Has patient ever been in the [employer]?: No Are There Guns or Other Weapons in Your Home?: No  Financial Resources:  Financial resources: Income from employment, Support from parents / caregiver / income from employment (Has [employer]) Does patient have a Lawyer or guardian?: No  Alcohol/Substance Abuse:  What has been your use of drugs/alcohol within the last 12 months?: Weed mainly on weekends,  opiates  every once in awhile, no alcohol (states he and alcohol do not mix) If attempted suicide, did drugs/alcohol play a role in this?: No Has  alcohol/substance abuse ever caused legal problems?: Yes (DUI and possession, 10 days in jail)  Social Support System: Patient's Community Support System: Good Describe Community Support System: Parents, 2 best friends Type of faith/religion: Ephriam Knuckles How does patient's faith help to cope with current illness?: Helps knowing he has a support system in times of need  Leisure/Recreation:  Leisure and Hobbies: racing go Nurse, learning disability  Strengths/Needs:  What things does the patient do well?: working on Chi[employer], woodworking In what areas does patient struggle / problems for patient:  Getting driver's license back, seeing how far and fast father is going downhill since stroke 2 weeks ago  Discharge Plan:  Does patient have access to transportation?: Yes (mother) Will patient be returning to same living situation after discharge?: Yes (with father) Currently receiving community mental health services: Yes, [facility] (medication, therapy) If no, would patient like referral for services when discharged?:  Does patient have financial barriers related to discharge medications?: No Patient description of barriers related to discharge medication:  Has BCBS now  Summary/Recommendations:   Summary and Recommendations (to be completed by the evaluator): Patient is a 26yo male readmitted under IVC with reports of suicidal ideation, disorganized behavior, auditory/visual hallucinations, delusions, mixing drugs and alcohol, and losing weight/not sleeping/deteriorating hygiene.  He reports weekend use of marijuana and occasional use of opiates and denies alcohol use, but his labs were positive also for benzos and amphetamines.  Primary stressors include father with dementia having a stroke 2 weeks ago and having increased dementia symptoms as a result.  He minimizes his drug use and denies suicidal ideation, states he needs to leave the hospital and go help care for his father.  He had  frontal lobe damage in 2011 from a fall from a bridge while intoxicated and doing pull ups, states he has memory issues and his mother helps him.  He thinks he follows up with [facility], but collateral information shows he does not have current providers, has an upcoming appointment at [facility].  Patient will benefit from crisis stabilization, medication evaluation, group therapy and psychoeducation, in addition to case management for discharge planning. At discharge it is recommended that Patient adhere to the established discharge plan and continue in treatment.  Ambrose Mantle, LCSW 03/26/2017, 3:52 PM

## 2017-03-26 NOTE — Progress Notes (Signed)
Adult Psychoeducational Group Note  Date:  03/26/2017 Time:  8:31 PM  Group Topic/Focus:  Wrap-Up Group:   The focus of this group is to help patients review their daily goal of treatment and discuss progress on daily workbooks.  Participation Level:  Active  Participation Quality:  Appropriate  Affect:  Appropriate  Cognitive:  Appropriate  Insight: Appropriate  Engagement in Group:  Engaged  Modes of Intervention:  Discussion  Additional Comments:  The patient expressed that he rates today a 4.The patient also said that he attended music therapy.  Octavio Manns 03/26/2017, 8:31 PM

## 2017-03-26 NOTE — [facility] Group Notes (Signed)
LCSW Group Therapy Note  03/26/2017   9:30-10:30am (300 hall)                10:30-11:30am (400 hall)                11:30am-12:00pm (500 hall)  Type of Therapy and Topic:  Group Therapy: Anger Cues and Responses  Participation Level:  Did Not Attend   Description of Group:   In this group, patients learned how to recognize the physical, cognitive, emotional, and behavioral responses they have to anger-provoking situations.  They identified a recent time they became angry and how they reacted.  They analyzed how their reaction was possibly beneficial and how it was possibly unhelpful.  The group discussed a variety of healthier coping skills that could help with such a situation in the future.  Deep breathing was practiced briefly.  Therapeutic Goals: 1. Patients will remember their last incident of anger and how they felt emotionally and physically, what their thoughts were at the time, and how they behaved. 2. Patients will identify how their behavior at that time worked for them, as well as how it worked against them. 3. Patients will explore possible new behaviors to use in future anger situations. 4. Patients will learn that anger itself is normal and cannot be eliminated, and that healthier reactions can assist with resolving conflict rather than worsening situations.  Summary of Patient Progress:  N/A  Therapeutic Modalities:   Cognitive Behavioral Therapy  Lynnell Chad  03/26/2017 12:00pm

## 2017-03-26 NOTE — [facility] Group Notes (Signed)
[facility] Group Notes:  (Nursing/MHT/Case Management/Adjunct)  Date:  03/26/2017  Time:  9:41 AM  Type of Therapy:  Orientation/Goals group  Participation Level:  Did Not Attend  Participation Quality:  Did Not Attend  Affect:  Did Not Attend  Cognitive:  Did Not Attend  Insight:  None  Engagement in Group:  Did Not Attend  Modes of Intervention:  Did Not Attend  Summary of Progress/Problems: Pt did not attend patient self inventory group/orientation group.   Danyella Mcginty Shanta 03/26/2017, 9:41 AM 

## 2017-03-26 NOTE — [facility] Suicide Risk Assessment (Addendum)
[facility] Admission Suicide Risk Assessment   Nursing information obtained from:  Patient Demographic factors:  Male, Caucasian, Low socioeconomic status Current Mental Status:  Suicidal ideation indicated by patient Loss Factors:  Decrease in vocational status, Loss of significant relationship, Financial problems / change in socioeconomic status Historical Factors:  Family history of suicide Risk Reduction Factors:  Sense of responsibility to family  Total Time spent with patient: 45 minutes Principal Problem: Substance induced mood disorder ([place]) Diagnosis:   Patient Active Problem List   Diagnosis Date Noted  . MDD (major depressive disorder), recurrent severe, without psychosis ([place]) [F33.2] 03/25/2017  . Polysubstance abuse ([place]) [F19.10] 10/15/2016  . Poor impulse control [R45.87]   . Stimulant use disorder [F15.90] 10/12/2016  . Substance or medication-induced bipolar and related disorder with onset during intoxication ([place]) [T41.962, F19.94] 07/05/2016  . Opioid use disorder, moderate, dependence ([place]) [F11.20] 07/05/2016  . Cannabis use disorder, severe, dependence ([place]) [F12.20] 07/05/2016  . Substance induced mood disorder ([place]) [F19.94] 06/30/2016  . Opiate overdose ([place]) [T40.601A] 08/28/2013  . Alcohol intoxication ([place]) [F10.929] 08/28/2013  . Physically aggressive behavior [R46.89] 08/28/2013  . Severely aggressive behavior [R46.89] 08/28/2013  . Marijuana abuse [F12.10] 08/28/2013  . Suicidal ideation [R45.851] 08/28/2013  . Homicidal ideation [R45.850] 08/28/2013  . Insomnia [G47.00] 02/20/2013  . Chronic back pain [M54.9, G89.29] 07/05/2012    Continued Clinical Symptoms:    The "Alcohol Use Disorders Identification Test", Guidelines for Use in Primary Care, Second Edition.  World Pharmacologist [facility]). Score between 0-7:  no or low risk or alcohol related problems. Score between 8-15:  moderate risk of alcohol related problems. Score between 16-19:  high risk  of alcohol related problems. Score 20 or above:  warrants further diagnostic evaluation for alcohol dependence and treatment.   CLINICAL FACTORS:  27 year old male. Presented to ED on 3/8 under IVC.  As per IVC the respondent "has been doing some type of drugs. He is hearing voices and he talks in some strange language. Respondent said today that he was going to hang himself or overdose on something. He has attempted suicide before. He has also cut himself in the past. Respondent also mixes alcohol with drugs. He has been diagnosed with Bipolar and Schizophrenia. He has lost weight recently and he does not sleep properly. Personal hygiene is suffering as well." Of note, patient presented anxious,pressured and at times incoherent in ED, and yesterday  patient was exhibiting isolative,  Disorganized, bizarre  behaviors on unit yesterday ,including  smearing feces on floor  At present patient presents calm, better organized.  Minimizes events leading to admission and states he feels his mother generated IVC " because my father and I were arguing ". He denies having recent suicidal ideations . Reports he has been doing " OK recently", minimizes depression, denies SI.  Patient endorse cannabis abuse and recent use of Xanax X1 ( denies pattern of BZD abuse or dependence). Admission UDS positive for Amphetamines, BZDs, Opiates, Cannabis. Admision BAL <7. He denies alcohol abuse   Patient has history of prior psychiatric admissions , and was admitted to [facility] in 09/2016, due to suicidal ideations, aggressive behavior and medication noncompliance . He was diagnosed with Substance Induced Mood Disorder . He was discharged on Depakote, Seroquel, Inderal. Patient states he feels these medications helped significantly, denies side effects .   Dx- Substance Induced Mood Disorder, Substance Induced Psychosis. Consider Polysubstance Dependence   Plan- Inpatient admission  Restarted on Depakote 500 mgrs Q 12  hours,  Seroquel 300 mgrs QHS , and on Ativan 1 mgr Q 6 hours PRN for anxiety, agitation or potential WDL .         Musculoskeletal: Strength & Muscle Tone: within normal limits no psychomotor agitation at this time, no overt restlessness, no distal tremors noted  Gait & Station: normal Patient leans: N/A  Psychiatric Specialty Exam: Physical Exam  ROS denies headache, denies visual disturbances, denies chest pain, no shortness of breath, no vomiting   Blood pressure 120/70, pulse (!) 102, temperature 98.1 F (36.7 C), temperature source Oral, resp. rate 16, height _0  (1.702 m), weight 88.9 kg (196 lb), SpO2 99 %.Body mass index is 30.7 kg/m.  General Appearance: Fairly Groomed  Eye Contact:  Good  Speech:  Normal Rate  Volume:  Normal  Mood:  denies feeling depressed, presents anxious   Affect:  anxious   Thought Process:  Linear and Descriptions of Associations: Intact- mostly linear but does become vaguely tangential with open ended questions   Orientation:  Full (Time, Place, and Person)- currently alert, attentive, oriented, no presentation of delirium at present   Thought Content:  denies hallucinations, no delusions expressed,does not currently present internally preoccupied   Suicidal Thoughts:  No denies suicidal or self injurious ideations, contracts for safety on unit, denies homicidal or violent ideations , specifically also denies homicidal ideations toward family members   Homicidal Thoughts:  No  Memory:  immediate recall 3/3  and 3/3 at 3 minutes   Judgement:  Other:  limited   Insight:  limited   Psychomotor Activity:  Normal no current overt psychomotor agitation   Concentration:  Concentration: Fair and Attention Span: Fair  Recall:  Good  Fund of Knowledge:  Good  Language:  Good  Akathisia:  Negative  Handed:  Right  AIMS (if indicated):     Assets:  Desire for Improvement Resilience  ADL's:  Improving   Cognition:  WNL  Sleep:  Number of Hours: 6.75       COGNITIVE FEATURES THAT CONTRIBUTE TO RISK:  Closed-mindedness and Loss of executive function    SUICIDE RISK:   Moderate:  Frequent suicidal ideation with limited intensity, and duration, some specificity in terms of plans, no associated intent, good self-control, limited dysphoria/symptomatology, some risk factors present, and identifiable protective factors, including available and accessible social support.  PLAN OF CARE: Patient will be admitted to inpatient psychiatric unit for stabilization and safety. Will provide and encourage milieu participation. Provide medication management and maked adjustments as needed.  Will follow daily.    I certify that inpatient services furnished can reasonably be expected to improve the patient's condition.   Jenne Campus, MD 03/26/2017, 3:04 PM

## 2017-03-27 DIAGNOSIS — Z79899 Other long term (current) drug therapy: Secondary | ICD-10-CM

## 2017-03-27 DIAGNOSIS — F17210 Nicotine dependence, cigarettes, uncomplicated: Secondary | ICD-10-CM

## 2017-03-27 LAB — HEMOGLOBIN A1C
Hgb A1c MFr Bld: 5.4 % (ref 4.8–5.6)
Mean Plasma Glucose: 108.28 mg/dL

## 2017-03-27 LAB — LIPID PANEL
CHOLESTEROL: 115 mg/dL (ref 0–200)
HDL: 35 mg/dL — ABNORMAL LOW (ref >40)
LDL CALC: 65 mg/dL (ref 0–99)
Total CHOL/HDL Ratio: 3.3 RATIO
Triglycerides: 76 mg/dL (ref <150)
VLDL: 15 mg/dL (ref 0–40)

## 2017-03-27 LAB — TSH: TSH: 3.679 u[IU]/mL (ref 0.350–4.500)

## 2017-03-27 MED ORDER — CYCLOBENZAPRINE HCL 5 MG PO TABS
ORAL_TABLET | ORAL | Status: AC
  Filled 2017-03-27: qty 1

## 2017-03-27 MED ORDER — CYCLOBENZAPRINE HCL 5 MG PO TABS
5.0000 mg | ORAL_TABLET | Freq: Three times a day (TID) | ORAL | Status: DC | PRN
  Administered 2017-03-27 – 2017-03-29 (×5): 5 mg via ORAL
  Filled 2017-03-27 (×4): qty 1
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
## 2017-03-27 NOTE — [facility] Group Notes (Signed)
[facility] Group Notes:  (Nursing/MHT/Case Management/Adjunct)  Date:  03/27/2017  Time:  10:14 AM  Type of Therapy:  Orientation/Goals group  Participation Level:  Did Not Attend  Participation Quality:  Did Not Attend  Affect:  Did Not Attend  Cognitive:  Did Not Attend  Insight:  None  Engagement in Group:  Did Not Attend  Modes of Intervention:  Did Not Attend  Summary of Progress/Problems: Pt did not attend patient self inventory group/orientation group.   Jacquelyne Balint Shanta 03/27/2017, 10:14 AM

## 2017-03-27 NOTE — Progress Notes (Signed)
Writer spoke with patient 1:1 and he reports that his day was better. His mother visited and he was informed of his medications for hs. He attended group and watched TV briefly in the dayroom before taking his medications. He reports that he has social anxiety and does not feel comfortable around a lot of people and he likes it better on the 500 hall. Support given and safety maintained on unit with 15 min checks.

## 2017-03-27 NOTE — [facility] Group Notes (Signed)
[facility] Group Notes:  (Nursing/MHT/Case Management/Adjunct)  Date:  03/27/2017  Time:  2:55 PM  Type of Therapy:  Psychoeducational Skills  Participation Level:  Did Not Attend  Participation Quality:  Did not attend  Affect:  Did not attend  Cognitive:  Did not attend  Insight:  None  Engagement in Group:  Did not attend  Modes of Intervention:  Did not attend  Summary of Progress/Problems: Pt did not attend Psychoeducational group with topic love languages.  Philip Thompson 03/27/2017, 2:55 PM

## 2017-03-27 NOTE — Progress Notes (Signed)
Patient ID: Philip Thompson, male   DOB: Mar 26, 1990, 27 y.o.   MRN: [id number]    D: Pt has been very isolative today on the unit, he remained in his room most of the day. Pt did not attend any groups nor did he engage in any treatment. Pt reported lost of withdrawal symptoms today on the unit, Tanika NP was made aware new orders noted. Pt reported that he felt better after getting medication and that the medication was helping with his withdrawal. Pt reported that he was depressed, hopeless, and that that he was having lots of anxiety. Pt also reported that he was having spasms all over his body, body aches, and was having some N/V.  Pt reported being negative SI/HI, no AH/VH noted. A: 15 min checks continued for patient safety. R: Pt safety maintained.

## 2017-03-27 NOTE — [facility] Group Notes (Signed)
[facility] Group Notes:  (Nursing/MHT/Case Management/Adjunct)  Date:  03/27/2017  Time:  10:18 AM  Type of Therapy:  Psychoeducational Skills  Participation Level:  Did Not Attend  Participation Quality:  Did not attend  Affect:  Did not attend  Cognitive:  Did not attend  Insight:  None  Engagement in Group:  Did not attend  Modes of Intervention:  Did not attend  Summary of Progress/Problems: Pt did not attend Psychoeducational group with topic healthy support systems.   Jacquelyne Balint Shanta 03/27/2017, 10:18 AM

## 2017-03-27 NOTE — [facility] Group Notes (Signed)
[facility] Group Notes: (Clinical Social Work)   03/27/2017      Type of Therapy:  Group Therapy   Participation Level:  Did Not Attend despite MHT prompting   Ambrose Mantle, LCSW 03/27/2017, 11:38 AM

## 2017-03-27 NOTE — Progress Notes (Signed)
Patient has been isolative to his room most of the night. His mother came to visit and inquired about his medications. Patient c/o muscle spasms he experiences in his left leg from a past fall. He received robaxin, heat packs and naproxen for his pain. He c/o experiencing withdrawal symptoms, sweating, chill and body aches. He received medication to aid with his withdrawal and encouraged plenty of fluids. Safety maintained on unit with 15 min checks.

## 2017-03-28 MED ORDER — IBUPROFEN 600 MG PO TABS
600.0000 mg | ORAL_TABLET | Freq: Four times a day (QID) | ORAL | Status: DC | PRN
  Administered 2017-03-28 – 2017-03-29 (×2): 600 mg via ORAL
  Filled 2017-03-28 (×2): qty 1

## 2017-03-28 MED ORDER — GABAPENTIN 300 MG PO CAPS
300.0000 mg | ORAL_CAPSULE | Freq: Three times a day (TID) | ORAL | Status: DC
  Administered 2017-03-28 – 2017-03-29 (×4): 300 mg via ORAL
  Filled 2017-03-28 (×2): qty 21
  Filled 2017-03-28 (×5): qty 1
  Filled 2017-03-28: qty 21
  Filled 2017-03-28: qty 1

## 2017-03-28 NOTE — Progress Notes (Signed)
Nursing Progress Note: 7p-7a D: Pt currently presents with a anxious/depressed/sad affect and behavior. Pt states "I feel bad physically but better mentally. I have had a lot of muscle spasms today. They have really been something. I feel better and more hopeful for the new day mentally." Interacting appropriately with the milieu. Pt reports good sleep during the previous night with current medication regimen. Pt did attend wrap-up group.  A: Pt provided with medications per providers orders. [name] labs and vitals were monitored throughout the night. Pt supported emotionally and encouraged to express concerns and questions. Pt educated on medications.  R: [name] safety ensured with 15 minute and environmental checks. Pt currently denies SI, HI, and AVH. Pt verbally contracts to seek staff if SI,HI, or AVH occurs and to consult with staff before acting on any harmful thoughts. Will continue to monitor.

## 2017-03-28 NOTE — Progress Notes (Signed)
Psychoeducational Group Note  Date:  03/28/2017 Time:  0922  Group Topic/Focus:  Goals Group:   The focus of this group is to help patients establish daily goals to achieve during treatment and discuss how the patient can incorporate goal setting into their daily lives to aide in recovery.  Participation Level: Did Not Attend  Participation Quality:  Not Applicable  Affect:  Not Applicable  Cognitive:  Not Applicable  Insight:  Not Applicable  Engagement in Group: Not Applicable  Additional Comments:  Pt was asleep and could not attend the AM group.  Jaleeya Mcnelly E 03/28/2017, 9:22 AM

## 2017-03-28 NOTE — [facility] Group Notes (Signed)
LCSW Group Therapy Note   03/28/2017 1:15pm   Type of Therapy and Topic:  Group Therapy:  Overcoming Obstacles   Participation Level:  Did Not Attend   Description of Group:    In this group patients will be encouraged to explore what they see as obstacles to their own wellness and recovery. They will be guided to discuss their thoughts, feelings, and behaviors related to these obstacles. The group will process together ways to cope with barriers, with attention given to specific choices patients can make. Each patient will be challenged to identify changes they are motivated to make in order to overcome their obstacles. This group will be process-oriented, with patients participating in exploration of their own experiences as well as giving and receiving support and challenge from other group members.   Therapeutic Goals: 1. Patient will identify personal and current obstacles as they relate to admission. 2. Patient will identify barriers that currently interfere with their wellness or overcoming obstacles.  3. Patient will identify feelings, thought process and behaviors related to these barriers. 4. Patient will identify two changes they are willing to make to overcome these obstacles:      Summary of Patient Progress      Therapeutic Modalities:   Cognitive Behavioral Therapy Solution Focused Therapy Motivational Interviewing Relapse Prevention Therapy  Ida Rogue, LCSW 03/28/2017 2:10 PM

## 2017-03-28 NOTE — Progress Notes (Signed)
The patient shared in group that he had a pretty good day, but he mentioned that he was experiencing muscle spasms. His goal for tomorrow is to simply "push through" his day and deal with his issues.

## 2017-03-28 NOTE — [facility] Suicide Risk Assessment (Addendum)
[facility] INPATIENT:  Family/Significant Other Suicide Prevention Education  Suicide Prevention Education:  Education Completed; Philip Thompson, mother [phone] has been identified by the patient as the family member/significant other with whom the patient will be residing, and identified as the person(s) who will aid the patient in the event of a mental health crisis (suicidal ideations/suicide attempt).  With written consent from the patient, the family member/significant other has been provided the following suicide prevention education, prior to the and/or following the discharge of the patient.  The suicide prevention education provided includes the following:  Suicide risk factors  Suicide prevention and interventions  National Suicide Hotline telephone number  [facility] assessment telephone number  [facility] Emergency Assistance 911  [facility] and/or Residential Mobile Crisis Unit telephone number  Request made of family/significant other to:  Remove weapons (e.g., guns, rifles, knives), all items previously/currently identified as safety concern.    Remove drugs/medications (over-the-counter, prescriptions, illicit drugs), all items previously/currently identified as a safety concern.  The family member/significant other verbalizes understanding of the suicide prevention education information provided.  The family member/significant other agrees to remove the items of safety concern listed above. Mother is concerned about drug use-she is afraid that he could die from overdose.  Mother has encouraged pt to get help, but he has declined to do so. Right now she feels she does not have leverage because he is living with his father, and his father wants to have him stay there  because 2 of his siblings have died of OD.   Baldo Daub [facility] Apr 15, 2017, 9:49 AM

## 2017-03-28 NOTE — Progress Notes (Signed)
Recreation Therapy Notes  INPATIENT RECREATION THERAPY ASSESSMENT  Patient Details Name: Philip Thompson MRN: [id number] DOB: December 06, 1990 Today's Date: 03/28/2017       Information Obtained From: Patient  Able to Participate in Assessment/Interview: Yes  Patient Presentation: Responsive  Reason for Admission (Per Patient): Patient reports being admitted into the hospital because his mother brought him in for taking too much medicine   Patient Stressors: Patient did not report any stressors   Coping Skills:   Ashby Dawes, wood working, Solicitor (2+):  Crafts - Woodworking  Frequency of Recreation/Participation: Data processing manager of Community Resources:  Yes  Community Resources:  Park, Tree surgeon, Research scientist (physical sciences)  Current Use: Yes  Expressed Interest in [employer] Information: No  [employer] of Residence:  [place]   Patient Main Form of Transportation: Set designer  Patient Strengths:  Glass blower/designer, working with Firefighter, Optician, dispensing   Patient Identified Areas of Improvement:  Patient was not able to identigy any areas of improvements   Patient Goal for Hospitalization:  To go home   Current SI (including self-harm):  No  Current HI:  No  Current AVH: No  Staff Intervention Plan: Group Attendance, Collaborate with Interdisciplinary Treatment Team  Consent to Intern Participation: Yes   Sheryle Hail, Recreation Therapy Intern   Sheryle Hail 03/28/2017, 2:19 PM

## 2017-03-28 NOTE — Progress Notes (Signed)
DAR NOTE: Patient presents with anxious affect and depressed mood.  Denies suicidal thoughts, auditory and visual hallucinations.  Described energy level as normal and concentration as good.  Rates depression at 2, hopelessness at 2, and anxiety at 4.  Maintained on routine safety checks.  Medications given as prescribed.  Support and encouragement offered as needed.   States goal for today is "group activities."  Patient is withdrawn and isolates to his room.   Vistaril 25 mg given for complain of anxiety with good effect.  Complain of leg spasm.  Requested and received Flexeril 5 mg with good effect.

## 2017-03-28 NOTE — Tx Team (Signed)
Interdisciplinary Treatment and Diagnostic Plan Update  03/28/2017 Time of Session: 8:55 AM  Philip Thompson MRN: [id number]  Principal Diagnosis: Substance induced mood disorder ([place])  Secondary Diagnoses: Principal Problem:   Substance induced mood disorder ([place]) Active Problems:   Polysubstance abuse ([place])   Current Medications:  Current Facility-Administered Medications  Medication Dose Route Frequency Provider Last Rate Last Dose  . acetaminophen (TYLENOL) tablet 650 mg  650 mg Oral Q6H PRN Ethelene Hal, NP   650 mg at 03/27/17 2003  . alum & mag hydroxide-simeth (MAALOX/MYLANTA) 200-200-20 MG/5ML suspension 30 mL  30 mL Oral Q4H PRN Ethelene Hal, NP      . cyclobenzaprine (FLEXERIL) tablet 5 mg  5 mg Oral TID PRN Derrill Center, NP   5 mg at 03/28/17 0803  . divalproex (DEPAKOTE) DR tablet 500 mg  500 mg Oral Q12H Ethelene Hal, NP   500 mg at 03/28/17 0803  . gabapentin (NEURONTIN) capsule 300 mg  300 mg Oral BID Ethelene Hal, NP   300 mg at 03/28/17 7544  . hydrOXYzine (ATARAX/VISTARIL) tablet 25 mg  25 mg Oral Q6H PRN Cobos, Myer Peer, MD   25 mg at 03/27/17 2145  . LORazepam (ATIVAN) tablet 1 mg  1 mg Oral Q6H PRN Cobos, Myer Peer, MD   1 mg at 03/27/17 1729  . magnesium hydroxide (MILK OF MAGNESIA) suspension 30 mL  30 mL Oral Daily PRN Ethelene Hal, NP      . multivitamin with minerals tablet 1 tablet  1 tablet Oral Daily Cobos, Myer Peer, MD   1 tablet at 03/28/17 0803  . naproxen (NAPROSYN) tablet 500 mg  500 mg Oral BID PRN Derrill Center, NP   500 mg at 03/27/17 1728  . ondansetron (ZOFRAN-ODT) disintegrating tablet 4 mg  4 mg Oral Q8H PRN Derrill Center, NP   4 mg at 03/27/17 0834  . QUEtiapine (SEROQUEL) tablet 300 mg  300 mg Oral QHS Ethelene Hal, NP   300 mg at 03/27/17 2145  . thiamine (VITAMIN B-1) tablet 100 mg  100 mg Oral Daily Cobos, Myer Peer, MD   100 mg at 03/28/17 0803    PTA  Medications: Medications Prior to Admission  Medication Sig Dispense Refill Last Dose  . cyclobenzaprine (FLEXERIL) 10 MG tablet Take 1 tablet (10 mg total) by mouth 3 (three) times daily as needed for muscle spasms. 30 tablet 0 Past Month at Unknown time  . divalproex (DEPAKOTE) 500 MG DR tablet Take 1 tablet (500 mg total) by mouth every 12 (twelve) hours. For mood stabilization 60 tablet 0 Past Month at Unknown time  . gabapentin (NEURONTIN) 300 MG capsule Take 1 capsule (300 mg total) by mouth 2 (two) times daily. 60 capsule 2 Past Month at Unknown time  . hydrOXYzine (ATARAX/VISTARIL) 25 MG tablet Take 1 tablet (25 mg total) by mouth every 6 (six) hours as needed for anxiety. 60 tablet 0 03/24/2017 at Unknown time  . QUEtiapine (SEROQUEL) 300 MG tablet Take 1 tablet (300 mg total) by mouth at bedtime. For mood control 30 tablet 0 03/24/2017 at Unknown time  . predniSONE (DELTASONE) 20 MG tablet 3 po qd x 3 d then 2 po qd x 3 d then 1 po qd x 2 d 17 tablet 0 Unknown at Unknown time  . propranolol (INDERAL) 10 MG tablet Take 1 tablet (10 mg total) by mouth 2 (two) times daily. For anxiety 60 tablet 0 Unknown  at Unknown time  . traZODone (DESYREL) 50 MG tablet One po qhs x 7 d then 2 po qhs 60 tablet 2 More than a month at Unknown time    Patient Stressors: Educational concerns Financial difficulties Health problems  Patient Strengths: Ability for insight Active sense of humor Average or above average intelligence  Treatment Modalities: Medication Management, Group therapy, Case management,  1 to 1 session with clinician, Psychoeducation, Recreational therapy.   Physician Treatment Plan for Primary Diagnosis: Substance induced mood disorder ([place]) Long Term Goal(s): Improvement in symptoms so as ready for discharge  Short Term Goals: Compliance with prescribed medications will improve Ability to identify triggers associated with substance abuse/mental health issues will improve Ability to  verbalize feelings will improve Ability to disclose and discuss suicidal ideas Ability to demonstrate self-control will improve  Medication Management: Evaluate patient's response, side effects, and tolerance of medication regimen.  Therapeutic Interventions: 1 to 1 sessions, Unit Group sessions and Medication administration.  Evaluation of Outcomes: Progressing  Physician Treatment Plan for Secondary Diagnosis: Principal Problem:   Substance induced mood disorder ([place]) Active Problems:   Polysubstance abuse ([place])   Long Term Goal(s): Improvement in symptoms so as ready for discharge  Short Term Goals: Compliance with prescribed medications will improve Ability to identify triggers associated with substance abuse/mental health issues will improve Ability to verbalize feelings will improve Ability to disclose and discuss suicidal ideas Ability to demonstrate self-control will improve  Medication Management: Evaluate patient's response, side effects, and tolerance of medication regimen.  Therapeutic Interventions: 1 to 1 sessions, Unit Group sessions and Medication administration.  Evaluation of Outcomes: Progressing   RN Treatment Plan for Primary Diagnosis: Substance induced mood disorder ([place]) Long Term Goal(s): Knowledge of disease and therapeutic regimen to maintain health will improve  Short Term Goals: Ability to identify and develop effective coping behaviors will improve and Compliance with prescribed medications will improve  Medication Management: RN will administer medications as ordered by provider, will assess and evaluate patient's response and provide education to patient for prescribed medication. RN will report any adverse and/or side effects to prescribing provider.  Therapeutic Interventions: 1 on 1 counseling sessions, Psychoeducation, Medication administration, Evaluate responses to treatment, Monitor vital signs and CBGs as ordered, Perform/monitor CIWA,  COWS, AIMS and Fall Risk screenings as ordered, Perform wound care treatments as ordered.  Evaluation of Outcomes: Progressing   LCSW Treatment Plan for Primary Diagnosis: Substance induced mood disorder ([place]) Long Term Goal(s): Safe transition to appropriate next level of care at discharge, Engage patient in therapeutic group addressing interpersonal concerns.  Short Term Goals: Engage patient in aftercare planning with referrals and resources  Therapeutic Interventions: Assess for all discharge needs, 1 to 1 time with Social worker, Explore available resources and support systems, Assess for adequacy in community support network, Educate family and significant other(s) on suicide prevention, Complete Psychosocial Assessment, Interpersonal group therapy.  Evaluation of Outcomes: Met  Although offered a referral to substance abuse rehab, pt declined.  Will return home, follow up Daphne in Treatment: Attending groups: Yes Participating in groups: Yes Taking medication as prescribed: Yes Toleration medication: Yes, no side effects reported at this time Family/Significant other contact made: Yes Patient understands diagnosis: No Limited insight-in denial about substance abuse Discussing patient identified problems/goals with staff: Yes Medical problems stabilized or resolved: Yes Denies suicidal/homicidal ideation: Yes Issues/concerns per patient self-inventory: None Other: N/A  New problem(s) identified: None identified at this time.   New  Short Term/Long Term Goal(s): "I don't really have any goals for my stay.  The meds are good. I think I may have overused some prescribed medication."  Discharge Plan or Barriers:   Reason for Continuation of Hospitalization:  Medication stabilization   Estimated Length of Stay: 3/15  Attendees: Patient: Philip Thompson 03/28/2017  8:55 AM  Physician: Maris Berger, MD 03/28/2017  8:55 AM  Nursing: Sena Hitch, RN 03/28/2017  8:55 AM  RN Care Manager: Lars Pinks, RN 03/28/2017  8:55 AM  Social Worker: Ripley Fraise 03/28/2017  8:55 AM  Recreational Therapist: Winfield Cunas 03/28/2017  8:55 AM  Other: Norberto Sorenson 03/28/2017  8:55 AM  Other:  03/28/2017  8:55 AM    Scribe for Treatment Team:  Roque Lias LCSW 03/28/2017 8:55 AM

## 2017-03-28 NOTE — Progress Notes (Addendum)
Recreation Therapy Notes  Date: 3.11.19 Time: 10:00 am Location: [address] Dayroom   Group Topic: Coping Skills   Goal Area(s) Addresses:  Goal 1.1: To improve coping skills  . Patient will Identify what a coping skills is  . Patient will identify personal coping skills  . Patient will be able to identify how coping skills can improve their wellness  Behavioral Response: Appropriate   Intervention: Game   Activity: Coping Skills Bingo: Each patient received one game board. Patients were able to design their own bingo card using colored pencils. Patients then passed around a cup of calling cards. Patient pulled one calling card and then explained how the selected coping skill could benefit them. The activity finished once all the calling cards have been called.   Education: Radiographer, therapeutic, Self-Esteem   Education Outcome: Acknowledges education  Clinical Observations/Feedback: Patient attended and participated appropriately during Recreation Therapy group treatment. Patient actively engaged in group activity, successfully identifying what a coping skill was and how they can be used in his everyday life. Patient was able to identify his personal coping skills stating "listening to Podcasts and working outside." Patient successfully met Goal 1.1 (see above)   Ranell Patrick, Recreation Therapy Intern  Ranell Patrick 03/28/2017 11:24 AM

## 2017-03-28 NOTE — Progress Notes (Signed)
[facility] MD Progress Note  03/28/2017 3:36 PM Philip Thompson  MRN:  [id number] Subjective:    Philip Thompson is a 27 y/o M with history of MDD, substance-induced mood disorder, and polysubstance abuse who was admitted on IVC with agitation, psychosis, and reported SI with plan to hang himself as per collateral from his mother. Pt was restarted on previous discharge medication combination of depakote, seroquel, and inderal. He was also started on gabapentin and flexeril for his complaint of muscle spasm. Pt reported improvement of his presenting symptoms.  Today upon evaluation, pt shares, "I've been dealing with these spasms and I basically over took my medications and it made me sort of foggy headed." Pt does not recall if he ever made any statements of SI, and he denies current and any recent SI. He denies HI/AVH as well. He reports that he is tolerating his medication well today, and his only complaint is some ongoing sensation of muscle spasm in his leg. Pt agrees to continue his current regimen without changes with exception of increasing dose of gabapentin.  Principal Problem: Substance induced mood disorder (HCC) Diagnosis:   Patient Active Problem List   Diagnosis Date Noted  . MDD (major depressive disorder), recurrent severe, without psychosis (HCC) [F33.2] 03/25/2017  . Polysubstance abuse (HCC) [F19.10] 10/15/2016  . Poor impulse control [R45.87]   . Stimulant use disorder [F15.90] 10/12/2016  . Substance or medication-induced bipolar and related disorder with onset during intoxication (HCC) [N82.956, F19.94] 07/05/2016  . Opioid use disorder, moderate, dependence (HCC) [F11.20] 07/05/2016  . Cannabis use disorder, severe, dependence (HCC) [F12.20] 07/05/2016  . Substance induced mood disorder (HCC) [F19.94] 06/30/2016  . Opiate overdose (HCC) [T40.601A] 08/28/2013  . Alcohol intoxication (HCC) [F10.929] 08/28/2013  . Physically aggressive behavior [R46.89] 08/28/2013  . Severely  aggressive behavior [R46.89] 08/28/2013  . Marijuana abuse [F12.10] 08/28/2013  . Suicidal ideation [R45.851] 08/28/2013  . Homicidal ideation [R45.850] 08/28/2013  . Insomnia [G47.00] 02/20/2013  . Chronic back pain [M54.9, G89.29] 07/05/2012   Total Time spent with patient: 30 minutes  Past Psychiatric History: see H&P  Past Medical History:  Past Medical History:  Diagnosis Date  . ADD (attention deficit disorder)   . Allergic rhinitis   . Anxiety   . Arthritis    joint pain  . Frontal head injury   . Heroin overdose [facility])     Past Surgical History:  Procedure Laterality Date  . FOOT SURGERY     to get a BB out  . NO PAST SURGERIES     Family History:  Family History  Problem Relation Age of Onset  . Dementia Father   . Alcohol abuse Father   . Diabetes Brother   . Cancer - Ovarian Maternal Grandmother   . Cancer - Lung Paternal Grandmother   . Cancer Paternal Grandfather   . Diabetes Other   . Hypertension Other   . Heart disease Other   . Heart attack Other    Family Psychiatric  History: see H&P Social History:  Social History   Substance and Sexual Activity  Alcohol Use Yes   Comment: hx of. last use 01/2016     Social History   Substance and Sexual Activity  Drug Use Yes  . Types: Marijuana   Comment: last use last night    Social History   Socioeconomic History  . Marital status: Single    Spouse name: None  . Number of children: None  . Years of education: None  .  Highest education level: None  Social Needs  . Financial resource strain: None  . Food insecurity - worry: None  . Food insecurity - inability: None  . Transportation needs - medical: None  . Transportation needs - non-medical: None  Occupational History  . None  Tobacco Use  . Smoking status: Current Every Day Smoker    Packs/day: 1.00    Years: 7.00    Pack years: 7.00    Types: Cigarettes  . Smokeless tobacco: Never Used  Substance and Sexual Activity  . Alcohol  use: Yes    Comment: hx of. last use 01/2016  . Drug use: Yes    Types: Marijuana    Comment: last use last night  . Sexual activity: Yes    Birth control/protection: Condom  Other Topics Concern  . None  Social History Narrative  . None   Additional Social History:    Pain Medications: (P) See MAR Prescriptions: (P) See MAR Over the Counter: (P) See MAR History of alcohol / drug use?: (P) Yes Negative Consequences of Use: (P) Financial, Legal, Personal relationships Withdrawal Symptoms: (P) Anorexia Name of Substance 1: (P) Cannabis  1 - Age of First Use: (P) unknown, pt is denying use even though labs are positive on arrival to ED 1 - Last Use / Amount: (P) unknown, pt is denying use even though labs are positive on arrival to ED Name of Substance 2: (P) Benzos 2 - Age of First Use: (P) unknown, pt is denying use even though labs are positive on arrival to ED 2 - Amount (size/oz): (P) VARIES Name of Substance 3: (P) Opiates  3 - Frequency: (P) 2-3 X WEEK 3 - Last Use / Amount: (P) unknown, pt is denying use even though labs are positive on arrival to ED Name of Substance 4: (P) Amphetamine  4 - Age of First Use: (P) unknown, pt is denying use even though labs are positive on arrival to ED            Sleep: Fair  Appetite:  Good  Current Medications: Current Facility-Administered Medications  Medication Dose Route Frequency Provider Last Rate Last Dose  . acetaminophen (TYLENOL) tablet 650 mg  650 mg Oral Q6H PRN Laveda Abbe, NP   650 mg at 03/28/17 1043  . alum & mag hydroxide-simeth (MAALOX/MYLANTA) 200-200-20 MG/5ML suspension 30 mL  30 mL Oral Q4H PRN Laveda Abbe, NP      . cyclobenzaprine (FLEXERIL) tablet 5 mg  5 mg Oral TID PRN Oneta Rack, NP   5 mg at 03/28/17 0803  . divalproex (DEPAKOTE) DR tablet 500 mg  500 mg Oral Q12H Laveda Abbe, NP   500 mg at 03/28/17 0803  . gabapentin (NEURONTIN) capsule 300 mg  300 mg Oral TID  Jolyne Loa T, MD   300 mg at 03/28/17 1414  . hydrOXYzine (ATARAX/VISTARIL) tablet 25 mg  25 mg Oral Q6H PRN Cobos, Rockey Situ, MD   25 mg at 03/28/17 0942  . ibuprofen (ADVIL,MOTRIN) tablet 600 mg  600 mg Oral Q6H PRN Micheal Likens, MD      . LORazepam (ATIVAN) tablet 1 mg  1 mg Oral Q6H PRN Cobos, Rockey Situ, MD   1 mg at 03/27/17 1729  . magnesium hydroxide (MILK OF MAGNESIA) suspension 30 mL  30 mL Oral Daily PRN Laveda Abbe, NP      . multivitamin with minerals tablet 1 tablet  1 tablet Oral Daily Cobos, Madaline Guthrie  A, MD   1 tablet at 03/28/17 0803  . ondansetron (ZOFRAN-ODT) disintegrating tablet 4 mg  4 mg Oral Q8H PRN Oneta Rack, NP   4 mg at 03/27/17 0834  . QUEtiapine (SEROQUEL) tablet 300 mg  300 mg Oral QHS Laveda Abbe, NP   300 mg at 03/27/17 2145  . thiamine (VITAMIN B-1) tablet 100 mg  100 mg Oral Daily Cobos, Rockey Situ, MD   100 mg at 03/28/17 0803    Lab Results:  Results for orders placed or performed during the hospital encounter of 03/25/17 (from the past 48 hour(s))  TSH     Status: None   Collection Time: 03/27/17  6:54 AM  Result Value Ref Range   TSH 3.679 0.350 - 4.500 uIU/mL    Comment: Performed by a 3rd Generation assay with a functional sensitivity of <=0.01 uIU/mL. Performed at [facility], [address] [place], [place] [address]   Lipid panel     Status: Abnormal   Collection Time: 03/27/17  6:54 AM  Result Value Ref Range   Cholesterol 115 0 - 200 mg/dL   Triglycerides 76 <604 mg/dL   HDL 35 (L) >54 mg/dL   Total CHOL/HDL Ratio 3.3 RATIO   VLDL 15 0 - 40 mg/dL   LDL Cholesterol 65 0 - 99 mg/dL    Comment:        Total Cholesterol/HDL:CHD Risk Coronary Heart Disease Risk Table                     Men   Women  1/2 Average Risk   3.4   3.3  Average Risk       5.0   4.4  2 X Average Risk   9.6   7.1  3 X Average Risk  23.4   11.0        Use the calculated Patient Ratio above and  the CHD Risk Table to determine the patient's CHD Risk.        ATP III CLASSIFICATION (LDL):  <100     mg/dL   Optimal  098-119  mg/dL   Near or Above                    Optimal  130-159  mg/dL   Borderline  147-829  mg/dL   High  >562     mg/dL   Very High Performed at [facility], [address], [place], [place] [address]   Hemoglobin A1c     Status: None   Collection Time: 03/27/17  6:54 AM  Result Value Ref Range   Hgb A1c MFr Bld 5.4 4.8 - 5.6 %    Comment: (NOTE) Pre diabetes:          5.7%-6.4% Diabetes:              >6.4% Glycemic control for   <7.0% adults with diabetes    Mean Plasma Glucose 108.28 mg/dL    Comment: Performed at [facility], [address], [place], [place] [address]    Blood Alcohol level:  Lab Results  Component Value Date   [facility] <10 03/25/2017   ETH <10 10/14/2016    Metabolic Disorder Labs: Lab Results  Component Value Date   HGBA1C 5.4 03/27/2017   MPG 108.28 03/27/2017   MPG 108 07/02/2016   Lab Results  Component Value Date   PROLACTIN 11.4 07/02/2016   Lab Results  Component  Value Date   CHOL 115 03/27/2017   TRIG 76 03/27/2017   HDL 35 (L) 03/27/2017   CHOLHDL 3.3 03/27/2017   VLDL 15 03/27/2017   LDLCALC 65 03/27/2017   LDLCALC 77 07/02/2016    Physical Findings: AIMS: Facial and Oral Movements Muscles of Facial Expression: None, normal Lips and Perioral Area: None, normal Jaw: None, normal Tongue: None, normal,Extremity Movements Upper (arms, wrists, hands, fingers): None, normal Lower (legs, knees, ankles, toes): None, normal, Trunk Movements Neck, shoulders, hips: None, normal, Overall Severity Severity of abnormal movements (highest score from questions above): None, normal Incapacitation due to abnormal movements: None, normal Patient's awareness of abnormal movements (rate only patient's report): No Awareness, Dental Status Current problems with teeth and/or dentures?: No Does  patient usually wear dentures?: No  CIWA:  CIWA-Ar Total: 0 COWS:  COWS Total Score: 3  Musculoskeletal: Strength & Muscle Tone: within normal limits Gait & Station: normal Patient leans: N/A  Psychiatric Specialty Exam: Physical Exam  Nursing note and vitals reviewed.   Review of Systems  Constitutional: Negative for chills and fever.  Respiratory: Negative for cough and shortness of breath.   Cardiovascular: Negative for chest pain.  Gastrointestinal: Negative for abdominal pain, heartburn, nausea and vomiting.  Musculoskeletal:       Reports L leg spasms    Blood pressure 97/69, pulse (!) 120, temperature (!) 97.2 F (36.2 C), temperature source Oral, resp. rate 20, height 5\' 7"  (1.702 m), weight 88.9 kg (196 lb), SpO2 99 %.Body mass index is 30.7 kg/m.  General Appearance: Casual  Eye Contact:  Good  Speech:  Clear and Coherent and Normal Rate  Volume:  Normal  Mood:  Anxious and Euthymic  Affect:  Appropriate, Congruent and Constricted  Thought Process:  Coherent and Goal Directed  Orientation:  Full (Time, Place, and Person)  Thought Content:  Logical  Suicidal Thoughts:  No  Homicidal Thoughts:  No  Memory:  Immediate;   Fair Recent;   Fair Remote;   Fair  Judgement:  Poor  Insight:  Lacking  Psychomotor Activity:  Normal  Concentration:  Concentration: Fair  Recall:  [employer] of Knowledge:  Fair  Language:  Fair  Akathisia:  No  Handed:    AIMS (if indicated):     Assets:  Communication Skills Resilience Social Support  ADL's:  Intact  Cognition:  WNL  Sleep:  Number of Hours: 6.25   Treatment Plan Summary: Daily contact with patient to assess and evaluate symptoms and progress in treatment and Medication management   -Continue inpatient hospitalization  - Substance-induced mood disorder   - continue Depakote 500 mg PO BID   -change  Neurontin 300 mg PO BID to neurontin 300mg  po TID   - continue Seroquel 300 mg PO  QHS  -Insomnia   -Continue  with Trazodone 100 mg PO for insomnia  -Will continue to monitor vitals ,medication compliance and treatment side effects while patient is here.   -CSW will continue working on disposition.   -Patient to participate in therapeutic milieu  Micheal Likens, MD 03/28/2017, 3:36 PM

## 2017-03-29 DIAGNOSIS — F1290 Cannabis use, unspecified, uncomplicated: Secondary | ICD-10-CM

## 2017-03-29 DIAGNOSIS — R45851 Suicidal ideations: Secondary | ICD-10-CM

## 2017-03-29 DIAGNOSIS — G4700 Insomnia, unspecified: Secondary | ICD-10-CM

## 2017-03-29 DIAGNOSIS — Z810 Family history of intellectual disabilities: Secondary | ICD-10-CM

## 2017-03-29 DIAGNOSIS — Z811 Family history of alcohol abuse and dependence: Secondary | ICD-10-CM

## 2017-03-29 MED ORDER — HYDROXYZINE HCL 25 MG PO TABS: 25.0000 mg | ORAL_TABLET | Freq: Four times a day (QID) | ORAL | 0 refills | Status: DC | PRN

## 2017-03-29 MED ORDER — IBUPROFEN 600 MG PO TABS: 600.0000 mg | ORAL_TABLET | Freq: Four times a day (QID) | ORAL | 0 refills | Status: DC | PRN

## 2017-03-29 MED ORDER — DIVALPROEX SODIUM 500 MG PO DR TAB: 500.0000 mg | DELAYED_RELEASE_TABLET | Freq: Two times a day (BID) | ORAL | 0 refills | Status: DC

## 2017-03-29 MED ORDER — CYCLOBENZAPRINE HCL 10 MG PO TABS: 10.0000 mg | ORAL_TABLET | Freq: Three times a day (TID) | ORAL | 0 refills | Status: DC | PRN

## 2017-03-29 MED ORDER — QUETIAPINE FUMARATE 300 MG PO TABS
300.0000 mg | ORAL_TABLET | Freq: Every day | ORAL | 0 refills | Status: DC
Start: 2017-03-29 — End: 2017-04-19

## 2017-03-29 MED ORDER — GABAPENTIN 300 MG PO CAPS: 300.0000 mg | ORAL_CAPSULE | Freq: Three times a day (TID) | ORAL | 0 refills | Status: DC

## 2017-03-29 NOTE — Progress Notes (Signed)
D: Pt A & O X 4. Denies SI, HI, AVH and pain when assessed. Presents with flat affect and depressed mood. Visible in milieu at intervals during shift. Minimal but appropriate interactions observed with peers and staff. Pt d/c home as ordered. Picked up in the lobby by his mother.  A: Scheduled and PRN medications administered as ordered with verbal education and effects monitored. D/C instructions reviewed with pt including medication samples, prescriptions and follow up appointment; compliance encouraged. All belongings in locker 35 returned to pt at time of departure. Q 15 minutes safety checks maintained till time of d/cwithout self harm gestures or outburst. R: Pt receptive to care. Compliant with medications, denied adverse drug reactions at this time. Verbalized understanding related to d/c instructions. Pt signed belonging sheet in agreement with items received. Vitals stable, appears to be in no physical distress at time of d/c from facility.

## 2017-03-29 NOTE — Plan of Care (Signed)
3.12.19 Patient attended and participated appropriately during Recreation Therapy group session successfully engaging in groups with a calm and appropriate mood at least 2x

## 2017-03-29 NOTE — Progress Notes (Signed)
Recreation Therapy Notes  Date: 3.12.19 Time: 10:00 a.m. Location: [address] Dayroom   Group Topic: Self-Esteem   Goal Area(s) Addresses:  Goal 1.1: To increase self-esteem  - Group will improve mood through participation during Recreation Therapy tx.  - Group will identify at least two positive traits by the end of therapy session.   - Group will identify the importance of self-esteem  Behavioral Response: Appropriate   Intervention: Craft/Art    Activity: Positive Affirmation Banner: Patients had the opportunity to color in a positive affirmation banner of their choice using the color pencils and markers provided.    Education: Self-Esteem   Education Outcome: Acknowledges Education  Clinical Observations/Feedback: Patient was engaged during group activity appropriately participating during Recreation Therapy group tx. Patient was able to identify two positive traits stating "persistant and challenging". Patient was able to identify the importance of self-esteem. Patient actively participated during introduction and closing discussion. Patient successfully met Goal 1.1 (see above).   Ranell Patrick, Recreation Therapy Intern   Ranell Patrick 03/29/2017 11:19 AM

## 2017-03-29 NOTE — Discharge Summary (Addendum)
Physician Discharge Summary Note  Patient:  Philip Thompson is an 27 y.o., male MRN:  [id number] DOB:  [birth year] Patient phone:  [phone] (home)  Patient address:   [address] [place] [address],  Total Time spent with patient: Greater than 30 minutes.  Date of Admission:  03/25/2017  Date of Discharge: 03/29/2017  Reason for Admission: Worsening psychosis, agitation & suicidal ideations with plans to hang himself.  Principal Problem: Substance induced mood disorder Philip Thompson)  Discharge Diagnoses: Patient Active Problem List   Diagnosis Date Noted  . MDD (major depressive disorder), recurrent severe, without psychosis (HCC) [F33.2] 03/25/2017  . Polysubstance abuse (HCC) [F19.10] 10/15/2016  . Poor impulse control [R45.87]   . Stimulant use disorder [F15.90] 10/12/2016  . Substance or medication-induced bipolar and related disorder with onset during intoxication (HCC) [O13.086, F19.94] 07/05/2016  . Opioid use disorder, moderate, dependence (HCC) [F11.20] 07/05/2016  . Cannabis use disorder, severe, dependence (HCC) [F12.20] 07/05/2016  . Substance induced mood disorder (HCC) [F19.94] 06/30/2016  . Opiate overdose (HCC) [T40.601A] 08/28/2013  . Alcohol intoxication (HCC) [F10.929] 08/28/2013  . Physically aggressive behavior [R46.89] 08/28/2013  . Severely aggressive behavior [R46.89] 08/28/2013  . Marijuana abuse [F12.10] 08/28/2013  . Suicidal ideation [R45.851] 08/28/2013  . Homicidal ideation [R45.850] 08/28/2013  . Insomnia [G47.00] 02/20/2013  . Chronic back pain [M54.9, G89.29] 07/05/2012   Past Psychiatric History: Stimulant & THC use disorder, Substance induced mood disorder.  Past Medical History:  Past Medical History:  Diagnosis Date  . ADD (attention deficit disorder)   . Allergic rhinitis   . Anxiety   . Arthritis    joint pain  . Frontal head injury   . Heroin overdose [facility])     Past Surgical History:  Procedure Laterality Date  . FOOT  SURGERY     to get a BB out  . NO PAST SURGERIES     Family History:  Family History  Problem Relation Age of Onset  . Dementia Father   . Alcohol abuse Father   . Diabetes Brother   . Cancer - Ovarian Maternal Grandmother   . Cancer - Lung Paternal Grandmother   . Cancer Paternal Grandfather   . Diabetes Other   . Hypertension Other   . Heart disease Other   . Heart attack Other    Family Psychiatric  History: See H&P  Social History:  Social History   Substance and Sexual Activity  Alcohol Use Yes   Comment: hx of. last use 01/2016     Social History   Substance and Sexual Activity  Drug Use Yes  . Types: Marijuana   Comment: last use last night    Social History   Socioeconomic History  . Marital status: Single    Spouse name: None  . Number of children: None  . Years of education: None  . Highest education level: None  Social Needs  . Financial resource strain: None  . Food insecurity - worry: None  . Food insecurity - inability: None  . Transportation needs - medical: None  . Transportation needs - non-medical: None  Occupational History  . None  Tobacco Use  . Smoking status: Current Every Day Smoker    Packs/day: 1.00    Years: 7.00    Pack years: 7.00    Types: Cigarettes  . Smokeless tobacco: Never Used  Substance and Sexual Activity  . Alcohol use: Yes    Comment: hx of. last use 01/2016  . Drug use: Yes  Types: Marijuana    Comment: last use last night  . Sexual activity: Yes    Birth control/protection: Condom  Other Topics Concern  . None  Social History Narrative  . None   Hospital Course: (Per md's SRA):  Philip Thompson is a 27 y/o M with history of MDD, substance-induced mood disorder, and polysubstance abuse who was admitted on IVC with agitation, psychosis, and reported SI with plan to hang himself as per collateral from his mother. Pt was restarted on previous discharge medication combination of depakote, seroquel, and inderal.  He was also started on gabapentin and flexeril for his complaint of muscle spasm. Pt reported improvement of his presenting symptoms.  After admission assessment & evaluation of his presenting symptoms, Philip Thompson was started on the medication regimen for his presenting symptoms. He received & was discharged on; Depakote DR 500 mg for mood stabilization, Gabapentin 300 mg for agitation, Hydroxyzine 25 prn mg for anxiety & Seroquel 300 mg for mood control. He was also enrolled & participated in the group counseling sessions being offered & held on this unit. He learned coping skills. He presented other significant pre-existing medical problems that required treatment or monitoring. He was resumed & discharged on his pertinent home medications for those health issues. He tolerated his treatment regimen without any adverse effects reported.  Today upon his discharge evaluation by the attending psychiatrist, pt shares, "I'm good today." He denies any specific concerns. His only physical complaint is some ongoing leg muscle spasms, which he reports are at baseline and are minimally bothersome. He denies SI/HI/AH/VH. He is sleeping well and his appetite is good. He is tolerating his medication regimen well, and he is in agreement to continue his current regimen without changes. Discussed with patient about importance of only taking medications exactly as prescribed to him, and pt verbalized good understanding. He was able to engage in safety planning including plan to return to [facility] or contact emergency services if he feels unable to maintain his own safety or the safety of others. Pt had no further questions, comments, or concerns.  Upon discharge, Philip Thompson was both mentally and medically stable. He denies SIHI, auditory/visual/tactile hallucinations, delusional thoughts & or paranoia. He will continue mental health care on an outpatient basis as noted below. He was provided with a 7 days worth supply samples of his [facility]  discharge medications. He left [facility] in no apparent distress with all personal belongings. Transportation per mother.  Physical Findings: AIMS: Facial and Oral Movements Muscles of Facial Expression: None, normal Lips and Perioral Area: None, normal Jaw: None, normal Tongue: None, normal,Extremity Movements Upper (arms, wrists, hands, fingers): None, normal Lower (legs, knees, ankles, toes): None, normal, Trunk Movements Neck, shoulders, hips: None, normal, Overall Severity Severity of abnormal movements (highest score from questions above): None, normal Incapacitation due to abnormal movements: None, normal Patient's awareness of abnormal movements (rate only patient's report): No Awareness, Dental Status Current problems with teeth and/or dentures?: No Does patient usually wear dentures?: No  CIWA:  CIWA-Ar Total: 7 COWS:  COWS Total Score: 3  Musculoskeletal: Strength & Muscle Tone: within normal limits Gait & Station: normal Patient leans: N/A  Psychiatric Specialty Exam: See SRA by MD Physical Exam  Nursing note and vitals reviewed. Constitutional: He appears well-developed.  HENT:  Head: Normocephalic.  Eyes: Pupils are equal, round, and reactive to light.  Cardiovascular: Normal rate.  Respiratory: Effort normal.  GI: Soft.  Genitourinary:  Genitourinary Comments: Deferred  Musculoskeletal: Normal range of motion.  Neurological: He is alert.  Skin: Skin is warm.  Psychiatric: He has a normal mood and affect. His behavior is normal.    Review of Systems  Constitutional: Negative.   HENT: Negative.   Eyes: Negative.   Respiratory: Negative.   Cardiovascular: Negative.   Gastrointestinal: Negative.   Genitourinary: Negative.   Musculoskeletal: Negative.   Skin: Negative.   Neurological: Negative.   Endo/Heme/Allergies: Negative.   Psychiatric/Behavioral: Positive for depression (Stabilized with medication prior to discharge), hallucinations (Hx. psychosis:  Stabilized with medication prior to discharge) and substance abuse (Hx. Amphetamine & THC use disorder). Negative for memory loss and suicidal ideas. The patient has insomnia (Stabilized with medication prior to discharge). The patient is not nervous/anxious.     Blood pressure 125/67, pulse (!) 120, temperature 97.7 F (36.5 C), temperature source Oral, resp. rate 20, height 5\' 7"  (1.702 m), weight 88.9 kg (196 lb), SpO2 99 %.Body mass index is 30.7 kg/m.      Has this patient used any form of tobacco in the last 30 days? (Cigarettes, Smokeless Tobacco, Cigars, and/or Pipes): No  Blood Alcohol level:  Lab Results  Component Value Date   ETH <10 03/25/2017   ETH <10 10/14/2016   Metabolic Disorder Labs:  Lab Results  Component Value Date   HGBA1C 5.4 03/27/2017   MPG 108.28 03/27/2017   MPG 108 07/02/2016   Lab Results  Component Value Date   PROLACTIN 11.4 07/02/2016   Lab Results  Component Value Date   CHOL 115 03/27/2017   TRIG 76 03/27/2017   HDL 35 (L) 03/27/2017   CHOLHDL 3.3 03/27/2017   VLDL 15 03/27/2017   LDLCALC 65 03/27/2017   LDLCALC 77 07/02/2016   See Psychiatric Specialty Exam and Suicide Risk Assessment completed by Attending Physician prior to discharge.  Discharge destination:  Home  Is patient on multiple antipsychotic therapies at discharge:  No   Has Patient had three or more failed trials of antipsychotic monotherapy by history:  No  Recommended Plan for Multiple Antipsychotic Therapies: NA  Allergies as of 03/29/2017   No Known Allergies     Medication List    STOP taking these medications   predniSONE 20 MG tablet Commonly known as:  DELTASONE   propranolol 10 MG tablet Commonly known as:  INDERAL   traZODone 50 MG tablet Commonly known as:  DESYREL     TAKE these medications     Indication  cyclobenzaprine 10 MG tablet Commonly known as:  FLEXERIL Take 1 tablet (10 mg total) by mouth 3 (three) times daily as needed for  muscle spasms.  Indication:  Muscle Spasm   divalproex 500 MG DR tablet Commonly known as:  DEPAKOTE Take 1 tablet (500 mg total) by mouth every 12 (twelve) hours. For mood stabilization  Indication:  Mood stabilization   gabapentin 300 MG capsule Commonly known as:  NEURONTIN Take 1 capsule (300 mg total) by mouth 3 (three) times daily. For agitation What changed:    when to take this  additional instructions  Indication:  Agitation   hydrOXYzine 25 MG tablet Commonly known as:  ATARAX/VISTARIL Take 1 tablet (25 mg total) by mouth every 6 (six) hours as needed for anxiety.  Indication:  Feeling Anxious   ibuprofen 600 MG tablet Commonly known as:  ADVIL,MOTRIN Take 1 tablet (600 mg total) by mouth every 6 (six) hours as needed for moderate pain. (May buy from over the counter): For pain  Indication:  Pain   QUEtiapine  300 MG tablet Commonly known as:  SEROQUEL Take 1 tablet (300 mg total) by mouth at bedtime. For mood control  Indication:  Mood control      Follow-up Information    [facility] [place] Follow up on 04/19/2017.   Specialty:  Behavioral Health Why:  The first Tuesday in April at 1:00 with Dr Rene Kocher for your hospital follow up appointment Contact information: [address] [place] [address] [id number] mc [place] [address] [phone]         Follow-up recommendations: Activity:  As tolerated Diet: As recommended by your primary care doctor. Keep all scheduled follow-up appointments as recommended.   Comments: Patient is instructed prior to discharge to: Take all medications as prescribed by his/her mental healthcare provider. Report any adverse effects and or reactions from the medicines to his/her outpatient provider promptly. Patient has been instructed & cautioned: To not engage in alcohol and or illegal drug use while on prescription medicines. In the event of worsening symptoms, patient is instructed to call  the crisis hotline, 911 and or go to the nearest ED for appropriate evaluation and treatment of symptoms. To follow-up with his/her primary care provider for your other medical issues, concerns and or health care needs.     Armandina Stammer, NP, PMHNP, FNP-BC 03/29/2017, 10:45 AM   Patient seen, Suicide Assessment Completed.  Disposition Plan Reviewed

## 2017-03-29 NOTE — Progress Notes (Signed)
  [facility] Adult Case Management Discharge Plan :  Will you be returning to the same living situation after discharge:  Yes,  home At discharge, do you have transportation home?: Yes,  mother Do you have the ability to pay for your medications: insurance   Release of information consent forms completed and in the chart;  Patient's signature needed at discharge.  Patient to Follow up at: Follow-up Information    BE[facility] OUTPAT[facility] THERAPY [place] Follow up on 04/19/2017.   Specialty:  Behavioral Health Why:  The first Tuesday in April at 1:00 with Dr Rene Kocher for your hospital follow up appointment Contact information: [address] [place] [phone] mc [place] [address] [phone]          Next level of care provider has access to [facility] Link:yes  Safety Planning and Suicide Prevention discussed: Yes,  yes     Has patient been referred to the Quitline?: Patient refused referral  Patient has been referred for addiction treatment: Pt. refused referral  Ida Rogue, LCSW 03/29/2017, 9:49 AM

## 2017-03-29 NOTE — Progress Notes (Signed)
Recreation Therapy Notes  INPATIENT RECREATION TR PLAN  Patient Details Name: BRAXEN DOBEK MRN: [id number] DOB: July 12, 1990 Today's Date: 03/29/2017  Rec Therapy Plan Is patient appropriate for Therapeutic Recreation?: Yes Treatment times per week: At least three  Estimated Length of Stay: 5-7 days  TR Treatment/Interventions: Group participation (Appropriate participation in Recreation Therapy group tx.)  Discharge Criteria Pt will be discharged from therapy if:: Discharged Treatment plan/goals/alternatives discussed and agreed upon by:: Patient/family  Discharge Summary Short term goals set: See Care Plan Short term goals met: Complete Progress toward goals comments: Groups attended Which groups?: Coping skills, Self-esteem Therapeutic equipment acquired: None  Reason patient discharged from therapy: Discharge from hospital Pt/family agrees with progress & goals achieved: Yes Date patient discharged from therapy: 03/29/17  Ranell Patrick, Recreation Therapy Intern   Ranell Patrick 03/29/2017, 9:36 AM

## 2017-03-29 NOTE — [facility] Suicide Risk Assessment (Signed)
[facility] Discharge Suicide Risk Assessment   Principal Problem: Substance induced mood disorder [facility]) Discharge Diagnoses:  Patient Active Problem List   Diagnosis Date Noted  . MDD (major depressive disorder), recurrent severe, without psychosis (HCC) [F33.2] 03/25/2017  . Polysubstance abuse (HCC) [F19.10] 10/15/2016  . Poor impulse control [R45.87]   . Stimulant use disorder [F15.90] 10/12/2016  . Substance or medication-induced bipolar and related disorder with onset during intoxication (HCC) [K80.034, F19.94] 07/05/2016  . Opioid use disorder, moderate, dependence (HCC) [F11.20] 07/05/2016  . Cannabis use disorder, severe, dependence (HCC) [F12.20] 07/05/2016  . Substance induced mood disorder (HCC) [F19.94] 06/30/2016  . Opiate overdose (HCC) [T40.601A] 08/28/2013  . Alcohol intoxication (HCC) [F10.929] 08/28/2013  . Physically aggressive behavior [R46.89] 08/28/2013  . Severely aggressive behavior [R46.89] 08/28/2013  . Marijuana abuse [F12.10] 08/28/2013  . Suicidal ideation [R45.851] 08/28/2013  . Homicidal ideation [R45.850] 08/28/2013  . Insomnia [G47.00] 02/20/2013  . Chronic back pain [M54.9, G89.29] 07/05/2012    Total Time spent with patient: 30 minutes  Musculoskeletal: Strength & Muscle Tone: within normal limits Gait & Station: normal Patient leans: N/A  Psychiatric Specialty Exam: Review of Systems  Constitutional: Negative for chills and fever.  Respiratory: Negative for cough and shortness of breath.   Cardiovascular: Negative for chest pain.  Gastrointestinal: Negative for abdominal pain, heartburn, nausea and vomiting.  Psychiatric/Behavioral: Negative for depression, hallucinations and suicidal ideas. The patient is not nervous/anxious.     Blood pressure 125/67, pulse (!) 120, temperature 97.7 F (36.5 C), temperature source Oral, resp. rate 20, height 5\' 7"  (1.702 m), weight 88.9 kg (196 lb), SpO2 99 %.Body mass index is 30.7 kg/m.  General Appearance:  Casual and Fairly Groomed  Patent attorney::  Good  Speech:  Clear and Coherent and Normal Rate  Volume:  Normal  Mood:  Euthymic  Affect:  Appropriate, Congruent and Constricted  Thought Process:  Coherent and Goal Directed  Orientation:  Full (Time, Place, and Person)  Thought Content:  Logical  Suicidal Thoughts:  No  Homicidal Thoughts:  No  Memory:  Immediate;   Fair Recent;   Fair Remote;   Fair  Judgement:  Fair  Insight:  Fair  Psychomotor Activity:  Normal  Concentration:  Fair  Recall:  [employer] of Knowledge:Fair  Language: Fair  Akathisia:  No  Handed:    AIMS (if indicated):     Assets:  Architect Housing Physical Health Resilience Social Support  Sleep:  Number of Hours: 5.5  Cognition: WNL  ADL's:  Intact   Mental Status Per Nursing Assessment::   On Admission:  Suicidal ideation indicated by patient  Demographic Factors:  Male, Caucasian, Low socioeconomic status and Unemployed  Loss Factors: Financial problems/change in socioeconomic status  Historical Factors: Prior suicide attempts, Family history of mental illness or substance abuse and Impulsivity  Risk Reduction Factors:   Sense of responsibility to family, Living with another person, especially a relative, Positive social support, Positive therapeutic relationship and Positive coping skills or problem solving skills  Continued Clinical Symptoms:  Severe Anxiety and/or Agitation Alcohol/Substance Abuse/Dependencies More than one psychiatric diagnosis  Cognitive Features That Contribute To Risk:  None    Suicide Risk:  Minimal: No identifiable suicidal ideation.  Patients presenting with no risk factors but with morbid ruminations; may be classified as minimal risk based on the severity of the depressive symptoms  Follow-up Information    BEH[facility]IORAL [facility] OUTPATIENT THERAPY [place] Follow up on 04/19/2017.   Specialty:  Behavioral Health Why:   The first Tuesday in April at 1:00 with Dr Rene Kocher for your hospital follow up appointment Contact information: [address] [place] [phone] mc [place] [address] [phone]        Subjective Data:  Philip Thompson is a 27 y/o M with history of MDD, substance-induced mood disorder, and polysubstance abuse who was admitted on IVC with agitation, psychosis, and reported SI with plan to hang himself as per collateral from his mother. Pt was restarted on previous discharge medication combination of depakote, seroquel, and inderal. He was also started on gabapentin and flexeril for his complaint of muscle spasm. Pt reported improvement of his presenting symptoms.  Today upon evaluation, pt shares, "I'm good today." He denies any specific concerns. His only physical complaint is some ongoing leg muscle spasms, which he reports are at baseline and are minimally bothersome. He denies SI/HI/AH/VH. He is sleeping well and his appetite is good. He is tolerating his medication regimen well, and he is in agreement to continue his current regimen without changes. Discussed with patient about importance of only taking medications exactly as prescribed to him, and pt verbalized good understanding. He was able to engage in safety planning including plan to return to [facility] or contact emergency services if he feels unable to maintain his own safety or the safety of others. Pt had no further questions, comments, or concerns.   Plan Of Care/Follow-up recommendations:   -Discharge to outpatient level of care  - Substance-induced mood disorder             - continue Depakote 500 mg PO BID             -continue neurontin 300mg  po TID             - continue Seroquel 300 mg PO QHS   -Insomnia             -Continue with Trazodone 100 mgPOfor insomnia  Activity:  as tolerated Diet:  normal Tests:  NA Other:  see above for DC plan  Philip Likens, MD 03/29/2017, 11:06 AM

## 2017-04-12 DIAGNOSIS — M545 Low back pain: Secondary | ICD-10-CM | POA: Diagnosis not present

## 2017-04-12 DIAGNOSIS — M4317 Spondylolisthesis, lumbosacral region: Secondary | ICD-10-CM | POA: Diagnosis not present

## 2017-04-12 DIAGNOSIS — M5417 Radiculopathy, lumbosacral region: Secondary | ICD-10-CM | POA: Diagnosis not present

## 2017-04-12 DIAGNOSIS — M4306 Spondylolysis, lumbar region: Secondary | ICD-10-CM | POA: Diagnosis not present

## 2017-04-14 ENCOUNTER — Ambulatory Visit: Payer: BLUE CROSS/BLUE SHIELD | Admitting: Family Medicine

## 2017-04-19 ENCOUNTER — Encounter (HOSPITAL_COMMUNITY): Payer: Self-pay | Admitting: Psychiatry

## 2017-04-19 ENCOUNTER — Ambulatory Visit (HOSPITAL_COMMUNITY): Payer: BLUE CROSS/BLUE SHIELD | Admitting: Psychiatry

## 2017-04-19 DIAGNOSIS — R4587 Impulsiveness: Secondary | ICD-10-CM | POA: Diagnosis not present

## 2017-04-19 DIAGNOSIS — S069X1S Unspecified intracranial injury with loss of consciousness of 30 minutes or less, sequela: Secondary | ICD-10-CM | POA: Diagnosis not present

## 2017-04-19 DIAGNOSIS — Z810 Family history of intellectual disabilities: Secondary | ICD-10-CM | POA: Diagnosis not present

## 2017-04-19 DIAGNOSIS — F332 Major depressive disorder, recurrent severe without psychotic features: Secondary | ICD-10-CM

## 2017-04-19 DIAGNOSIS — F1994 Other psychoactive substance use, unspecified with psychoactive substance-induced mood disorder: Secondary | ICD-10-CM

## 2017-04-19 DIAGNOSIS — Z813 Family history of other psychoactive substance abuse and dependence: Secondary | ICD-10-CM

## 2017-04-19 DIAGNOSIS — F1910 Other psychoactive substance abuse, uncomplicated: Secondary | ICD-10-CM

## 2017-04-19 DIAGNOSIS — F17210 Nicotine dependence, cigarettes, uncomplicated: Secondary | ICD-10-CM

## 2017-04-19 DIAGNOSIS — F19929 Other psychoactive substance use, unspecified with intoxication, unspecified: Secondary | ICD-10-CM

## 2017-04-19 DIAGNOSIS — F4010 Social phobia, unspecified: Secondary | ICD-10-CM

## 2017-04-19 DIAGNOSIS — S069XAA Unspecified intracranial injury with loss of consciousness status unknown, initial encounter: Secondary | ICD-10-CM | POA: Insufficient documentation

## 2017-04-19 DIAGNOSIS — Z811 Family history of alcohol abuse and dependence: Secondary | ICD-10-CM | POA: Diagnosis not present

## 2017-04-19 DIAGNOSIS — F1290 Cannabis use, unspecified, uncomplicated: Secondary | ICD-10-CM

## 2017-04-19 DIAGNOSIS — F09 Unspecified mental disorder due to known physiological condition: Secondary | ICD-10-CM

## 2017-04-19 DIAGNOSIS — F419 Anxiety disorder, unspecified: Secondary | ICD-10-CM

## 2017-04-19 DIAGNOSIS — S069X9A Unspecified intracranial injury with loss of consciousness of unspecified duration, initial encounter: Secondary | ICD-10-CM | POA: Insufficient documentation

## 2017-04-19 MED ORDER — CITALOPRAM HYDROBROMIDE 20 MG PO TABS: ORAL_TABLET | ORAL | 0 refills | Status: DC

## 2017-04-19 MED ORDER — QUETIAPINE FUMARATE 300 MG PO TABS: 300.0000 mg | ORAL_TABLET | Freq: Every day | ORAL | 0 refills | Status: DC

## 2017-04-19 MED ORDER — GABAPENTIN 300 MG PO CAPS: 300.0000 mg | ORAL_CAPSULE | Freq: Three times a day (TID) | ORAL | 0 refills | Status: DC

## 2017-04-19 MED ORDER — DIVALPROEX SODIUM 500 MG PO DR TAB: 500.0000 mg | DELAYED_RELEASE_TABLET | Freq: Two times a day (BID) | ORAL | 0 refills | Status: DC

## 2017-04-19 MED ORDER — QUETIAPINE FUMARATE 50 MG PO TABS: 50.0000 mg | ORAL_TABLET | Freq: Two times a day (BID) | ORAL | 2 refills | Status: DC | PRN

## 2017-04-19 MED ORDER — GABAPENTIN 300 MG PO CAPS
300.0000 mg | ORAL_CAPSULE | Freq: Three times a day (TID) | ORAL | 0 refills | Status: DC
Start: 2017-04-19 — End: 2017-04-19

## 2017-04-19 MED ORDER — QUETIAPINE FUMARATE 200 MG PO TABS: 200.0000 mg | ORAL_TABLET | Freq: Every day | ORAL | 0 refills | Status: DC

## 2017-04-19 NOTE — Patient Instructions (Signed)
[facility] - [facility] Memory and cognitive testing [phone]
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
## 2017-04-27 ENCOUNTER — Telehealth: Payer: Self-pay | Admitting: Family Medicine

## 2017-04-27 NOTE — Telephone Encounter (Signed)
Feet and legs swollen. One is swollen so tight that if he hits it it will weep. Going on for about a month. Mom was wondering if  Urgent care would xray? Consulted with Dr. Lorin Picket and he stated to go to ER therefore if any test need to be run, they would have the proper equipment to do so. Pt mom and pt advised to go to ER. Both verbalized understanding.

## 2017-05-11 ENCOUNTER — Ambulatory Visit: Payer: BLUE CROSS/BLUE SHIELD | Admitting: Nurse Practitioner

## 2017-05-11 ENCOUNTER — Encounter: Payer: Self-pay | Admitting: Nurse Practitioner

## 2017-05-11 VITALS — BP 108/72 | Wt 208.2 lb

## 2017-05-11 DIAGNOSIS — R609 Edema, unspecified: Secondary | ICD-10-CM | POA: Diagnosis not present

## 2017-05-11 MED ORDER — FUROSEMIDE 20 MG PO TABS: 20.0000 mg | ORAL_TABLET | Freq: Every day | ORAL | 2 refills | Status: DC

## 2017-05-11 MED ORDER — POTASSIUM CHLORIDE CRYS ER 10 MEQ PO TBCR: 10.0000 meq | EXTENDED_RELEASE_TABLET | Freq: Every day | ORAL | 2 refills | Status: DC

## 2017-05-11 NOTE — Progress Notes (Signed)
Subjective: Presents with complaints of swelling in his legs that began about 2 months ago.  Noticed it more in the left leg at first, began in the right leg over the past 2 to 3 weeks.  No recent changes in medications or dosages.  His current mental health regimen is working well.  Regular follow-up with mental health.  No chest pain, shortness of breath, palpitations or orthopnea.  No excessive sodium intake.  Objective:   BP 108/72   Wt 208 lb 3.2 oz (94.4 kg)   BMI 32.61 kg/m  NAD.  Alert, oriented.  Lungs clear.  Heart regular rate and rhythm.  No murmur or gallop noted.  No JVD.  Lower extremities 2-3+ pitting edema noted bilaterally. Patient had extensive lab work done in the month of March see results.  Nothing noted that would explain this level of edema.  Assessment:  Peripheral edema    Plan:   Meds ordered this encounter  Medications  . furosemide (LASIX) 20 MG tablet    Sig: Take 1 tablet (20 mg total) by mouth daily. Prn leg swelling    Dispense:  30 tablet    Refill:  2    Order Specific Question:   Supervising Provider    Answer:   Merlyn Albert [[id number]]  . potassium chloride (K-DUR,KLOR-CON) 10 MEQ tablet    Sig: Take 1 tablet (10 mEq total) by mouth daily. With furosemide    Dispense:  30 tablet    Refill:  2    Order Specific Question:   Supervising Provider    Answer:   Merlyn Albert [[id number]]   With no other symptomatology, it is possible that edema is due to 1 of his medications prescribed by his psychiatrist.  Patient to discuss this with mental health at his next visit.  Trial of Lasix along with potassium as directed.  Call back in 1 week if no improvement, sooner if worse.  Warning signs were reviewed including signs of CHF and orthopnea.

## 2017-05-23 ENCOUNTER — Other Ambulatory Visit: Payer: Self-pay | Admitting: Nurse Practitioner

## 2017-05-31 ENCOUNTER — Ambulatory Visit (HOSPITAL_COMMUNITY): Payer: BLUE CROSS/BLUE SHIELD | Admitting: Licensed Clinical Social Worker

## 2017-05-31 DIAGNOSIS — F1994 Other psychoactive substance use, unspecified with psychoactive substance-induced mood disorder: Secondary | ICD-10-CM

## 2017-05-31 DIAGNOSIS — F332 Major depressive disorder, recurrent severe without psychotic features: Secondary | ICD-10-CM | POA: Diagnosis not present

## 2017-05-31 DIAGNOSIS — F19929 Other psychoactive substance use, unspecified with intoxication, unspecified: Secondary | ICD-10-CM | POA: Diagnosis not present

## 2017-06-01 ENCOUNTER — Encounter (HOSPITAL_COMMUNITY): Payer: Self-pay | Admitting: Licensed Clinical Social Worker
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
## 2017-06-21 ENCOUNTER — Encounter (HOSPITAL_COMMUNITY): Payer: Self-pay | Admitting: Psychiatry

## 2017-06-21 ENCOUNTER — Ambulatory Visit (HOSPITAL_COMMUNITY): Payer: BLUE CROSS/BLUE SHIELD | Admitting: Psychiatry

## 2017-06-21 DIAGNOSIS — Z79899 Other long term (current) drug therapy: Secondary | ICD-10-CM

## 2017-06-21 DIAGNOSIS — S069X1S Unspecified intracranial injury with loss of consciousness of 30 minutes or less, sequela: Secondary | ICD-10-CM

## 2017-06-21 DIAGNOSIS — Z818 Family history of other mental and behavioral disorders: Secondary | ICD-10-CM

## 2017-06-21 DIAGNOSIS — F332 Major depressive disorder, recurrent severe without psychotic features: Secondary | ICD-10-CM

## 2017-06-21 DIAGNOSIS — F17210 Nicotine dependence, cigarettes, uncomplicated: Secondary | ICD-10-CM | POA: Diagnosis not present

## 2017-06-21 DIAGNOSIS — Z811 Family history of alcohol abuse and dependence: Secondary | ICD-10-CM

## 2017-06-21 DIAGNOSIS — R4587 Impulsiveness: Secondary | ICD-10-CM

## 2017-06-21 MED ORDER — DIVALPROEX SODIUM 500 MG PO DR TAB: 500.0000 mg | DELAYED_RELEASE_TABLET | Freq: Two times a day (BID) | ORAL | 0 refills | Status: DC

## 2017-06-21 MED ORDER — QUETIAPINE FUMARATE 300 MG PO TABS: 600.0000 mg | ORAL_TABLET | Freq: Every day | ORAL | 0 refills | Status: DC

## 2017-06-21 MED ORDER — CITALOPRAM HYDROBROMIDE 40 MG PO TABS: 40.0000 mg | ORAL_TABLET | Freq: Every day | ORAL | 0 refills | Status: DC

## 2017-06-21 MED ORDER — TRAZODONE HCL 100 MG PO TABS: 100.0000 mg | ORAL_TABLET | Freq: Every day | ORAL | 0 refills | Status: DC

## 2017-06-21 MED ORDER — BUSPIRONE HCL 15 MG PO TABS: 15.0000 mg | ORAL_TABLET | Freq: Two times a day (BID) | ORAL | 1 refills | Status: DC | PRN
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
## 2017-07-18 ENCOUNTER — Ambulatory Visit (HOSPITAL_COMMUNITY): Payer: BLUE CROSS/BLUE SHIELD | Admitting: Licensed Clinical Social Worker

## 2017-07-22 ENCOUNTER — Telehealth (HOSPITAL_COMMUNITY): Payer: Self-pay

## 2017-07-22 DIAGNOSIS — S069X1S Unspecified intracranial injury with loss of consciousness of 30 minutes or less, sequela: Secondary | ICD-10-CM

## 2017-07-22 DIAGNOSIS — F332 Major depressive disorder, recurrent severe without psychotic features: Secondary | ICD-10-CM

## 2017-07-22 NOTE — Telephone Encounter (Addendum)
Patients mom called asking can the patient received a refill on Seroquel 300mg  tabs.    Mom said that the son is taking 2 1/2 tablets at night that is the only way that he can fall asleep. Next appointment is 08-03-17 Please advise

## 2017-07-22 NOTE — Telephone Encounter (Signed)
okay to send a refill of 2.5 tablets - 750 mg qhs for 30 days

## 2017-07-25 MED ORDER — QUETIAPINE FUMARATE 300 MG PO TABS: 600.0000 mg | ORAL_TABLET | Freq: Every day | ORAL | 0 refills | Status: DC

## 2017-08-03 ENCOUNTER — Ambulatory Visit (HOSPITAL_COMMUNITY): Payer: BLUE CROSS/BLUE SHIELD | Admitting: Psychiatry

## 2017-08-03 ENCOUNTER — Encounter (HOSPITAL_COMMUNITY): Payer: Self-pay | Admitting: Psychiatry

## 2017-08-03 DIAGNOSIS — S069X1S Unspecified intracranial injury with loss of consciousness of 30 minutes or less, sequela: Secondary | ICD-10-CM

## 2017-08-03 DIAGNOSIS — R4587 Impulsiveness: Secondary | ICD-10-CM

## 2017-08-03 DIAGNOSIS — F332 Major depressive disorder, recurrent severe without psychotic features: Secondary | ICD-10-CM

## 2017-08-03 MED ORDER — DIVALPROEX SODIUM 500 MG PO DR TAB: 500.0000 mg | DELAYED_RELEASE_TABLET | Freq: Two times a day (BID) | ORAL | 0 refills | Status: DC

## 2017-08-03 MED ORDER — CITALOPRAM HYDROBROMIDE 40 MG PO TABS: 40.0000 mg | ORAL_TABLET | Freq: Every day | ORAL | 0 refills | Status: DC

## 2017-08-03 MED ORDER — QUETIAPINE FUMARATE 300 MG PO TABS: 600.0000 mg | ORAL_TABLET | Freq: Every day | ORAL | 3 refills | Status: DC

## 2017-08-03 MED ORDER — GABAPENTIN 300 MG PO CAPS: 300.0000 mg | ORAL_CAPSULE | Freq: Three times a day (TID) | ORAL | 0 refills | Status: DC

## 2017-08-03 MED ORDER — BUSPIRONE HCL 15 MG PO TABS: 15.0000 mg | ORAL_TABLET | Freq: Three times a day (TID) | ORAL | 3 refills | Status: DC

## 2017-08-03 MED ORDER — TRAZODONE HCL 100 MG PO TABS: 100.0000 mg | ORAL_TABLET | Freq: Every day | ORAL | 0 refills | Status: DC
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
## 2017-08-08 ENCOUNTER — Ambulatory Visit (HOSPITAL_COMMUNITY): Payer: BLUE CROSS/BLUE SHIELD | Admitting: Licensed Clinical Social Worker

## 2017-08-08 DIAGNOSIS — F332 Major depressive disorder, recurrent severe without psychotic features: Secondary | ICD-10-CM

## 2017-08-09 ENCOUNTER — Encounter (HOSPITAL_COMMUNITY): Payer: Self-pay | Admitting: Licensed Clinical Social Worker

## 2017-08-09 NOTE — Progress Notes (Signed)
   THERAPIST PROGRESS NOTE  Session Time: 4:00 pm-4:50 pm  Participation Level: Active  Behavioral Response: CasualAlertDepressed  Type of Therapy: Individual Therapy  Treatment Goals addressed: Coping  Interventions: CBT and Solution Focused  Summary: Philip Thompson is a 27 y.o. male who presents  oriented x5 (person, place, situation, time and object), alert, casually dressed, appropriately groomed, average height, average weight and cooperative for an assessment to address mood and sleep. Patient has a history of medical treatment including frontal head injury and heroin overdose. Patient has a history of mental health treatment including outpatient therapy and medication management. Patient denies suicidal and homicidal ideations. Patient denies psychosis including auditory and visual hallucinations. Patient current substance abuse but admits to casual substance use. Patient denies a history of elopement. Patient is at low risk for lethality.   Physically: Patient is having leg spasms that are painful for him. His hip and back hurt but they always hurt. Patient is experiencing improved sleep. Spiritually/values: None identified.  Relationships: Patient is getting along with his mother and family. Patient's friend's mother passed away. Patient is being a support to his friend which has brought up feelings of his siblings passing away.  Emotionally/Mentally/Behavior: Patient's mood has been depressive for the past several days. He had a friend whose mother passed away. He is trying to be a support but it brought up feelings of how disrespectful the funeral home was to his family when his siblings passed away (didn't do a good job preparing the bodies).After discussion, patient said the most helpful thing for him is to be consistent with getting out of his room, getting out of the house, spend time with friends when he can and keep up with his health.     Patient engaged in session. He  responded well to interventions. Patient continues to meet criteria for Major depressive disorder, recurrent severe, without psychosis and Substance or medication-induced bipolar and related disorder with onset during intoxication. Patient will continue in outpatient therapy due to being the least restrictive service to meet his needs. Patient made minimal progress on his goals.    Suicidal/Homicidal: Negativewith intent/plan  Therapist Response: Therapist reviewed patient's recent thoughts and behaviors. Therapist utilized CBT to address mood. Therapist processed patient's feelings to identify triggers. Therapist discussed his grief related to his family and how he can manage his mood through behavior modification.   Plan: Return again in 3 weeks.  Diagnosis: Axis I: Major depressive disorder, recurrent severe, without psychosis    Axis II: No diagnosis    Bynum Bellows, LCSW 08/09/2017

## 2017-08-10 ENCOUNTER — Telehealth (HOSPITAL_COMMUNITY): Payer: Self-pay

## 2017-08-10 NOTE — Telephone Encounter (Signed)
Prior auth for patients Seroquel came from Cover my Meds, this was submitted today and we are waiting for the response. I will continue to check and inform patient if it is approved.

## 2017-08-22 ENCOUNTER — Ambulatory Visit (HOSPITAL_COMMUNITY): Payer: BLUE CROSS/BLUE SHIELD | Admitting: Licensed Clinical Social Worker

## 2017-09-07 ENCOUNTER — Encounter (HOSPITAL_COMMUNITY): Payer: Self-pay | Admitting: Psychiatry

## 2017-09-07 ENCOUNTER — Ambulatory Visit (HOSPITAL_COMMUNITY): Payer: BLUE CROSS/BLUE SHIELD | Admitting: Psychiatry

## 2017-09-07 DIAGNOSIS — S069X1S Unspecified intracranial injury with loss of consciousness of 30 minutes or less, sequela: Secondary | ICD-10-CM

## 2017-09-07 DIAGNOSIS — R4587 Impulsiveness: Secondary | ICD-10-CM | POA: Diagnosis not present

## 2017-09-07 DIAGNOSIS — F1210 Cannabis abuse, uncomplicated: Secondary | ICD-10-CM

## 2017-09-07 DIAGNOSIS — K5900 Constipation, unspecified: Secondary | ICD-10-CM

## 2017-09-07 DIAGNOSIS — F17210 Nicotine dependence, cigarettes, uncomplicated: Secondary | ICD-10-CM

## 2017-09-07 DIAGNOSIS — F332 Major depressive disorder, recurrent severe without psychotic features: Secondary | ICD-10-CM | POA: Diagnosis not present

## 2017-09-07 DIAGNOSIS — Z811 Family history of alcohol abuse and dependence: Secondary | ICD-10-CM

## 2017-09-07 MED ORDER — BUSPIRONE HCL 15 MG PO TABS: 15.0000 mg | ORAL_TABLET | Freq: Three times a day (TID) | ORAL | 3 refills | Status: DC

## 2017-09-07 MED ORDER — DIVALPROEX SODIUM 500 MG PO DR TAB: 500.0000 mg | DELAYED_RELEASE_TABLET | Freq: Two times a day (BID) | ORAL | 0 refills | Status: DC

## 2017-09-07 MED ORDER — GABAPENTIN 300 MG PO CAPS: 600.0000 mg | ORAL_CAPSULE | Freq: Three times a day (TID) | ORAL | 0 refills | Status: DC

## 2017-09-07 MED ORDER — QUETIAPINE FUMARATE 400 MG PO TABS: 800.0000 mg | ORAL_TABLET | Freq: Every day | ORAL | 0 refills | Status: DC

## 2017-09-07 MED ORDER — CITALOPRAM HYDROBROMIDE 40 MG PO TABS: 40.0000 mg | ORAL_TABLET | Freq: Every day | ORAL | 0 refills | Status: DC

## 2017-09-07 MED ORDER — TRAZODONE HCL 100 MG PO TABS: 100.0000 mg | ORAL_TABLET | Freq: Every day | ORAL | 0 refills | Status: DC
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
## 2017-09-11 ENCOUNTER — Telehealth: Payer: Self-pay | Admitting: Family Medicine

## 2017-09-11 NOTE — Telephone Encounter (Signed)
The patient's behavioral health doctor has requested we see the patient for constipation  Please touch base with the patient let him know that we are available to see him for medical follow-up/evaluation of any constipation issues he is having-he can schedule a visit at his convenience

## 2017-09-12 ENCOUNTER — Encounter: Payer: Self-pay | Admitting: Psychology

## 2017-09-12 ENCOUNTER — Encounter: Payer: BLUE CROSS/BLUE SHIELD | Attending: Psychology | Admitting: Psychology

## 2017-09-12 DIAGNOSIS — Z8041 Family history of malignant neoplasm of ovary: Secondary | ICD-10-CM | POA: Insufficient documentation

## 2017-09-12 DIAGNOSIS — S069X2A Unspecified intracranial injury with loss of consciousness of 31 minutes to 59 minutes, initial encounter: Secondary | ICD-10-CM | POA: Diagnosis not present

## 2017-09-12 DIAGNOSIS — F333 Major depressive disorder, recurrent, severe with psychotic symptoms: Secondary | ICD-10-CM

## 2017-09-12 DIAGNOSIS — F988 Other specified behavioral and emotional disorders with onset usually occurring in childhood and adolescence: Secondary | ICD-10-CM | POA: Diagnosis not present

## 2017-09-12 DIAGNOSIS — Z801 Family history of malignant neoplasm of trachea, bronchus and lung: Secondary | ICD-10-CM | POA: Insufficient documentation

## 2017-09-12 DIAGNOSIS — Z833 Family history of diabetes mellitus: Secondary | ICD-10-CM | POA: Insufficient documentation

## 2017-09-12 DIAGNOSIS — Z811 Family history of alcohol abuse and dependence: Secondary | ICD-10-CM | POA: Diagnosis not present

## 2017-09-12 DIAGNOSIS — G8921 Chronic pain due to trauma: Secondary | ICD-10-CM | POA: Diagnosis not present

## 2017-09-12 DIAGNOSIS — F0630 Mood disorder due to known physiological condition, unspecified: Secondary | ICD-10-CM | POA: Diagnosis not present

## 2017-09-12 DIAGNOSIS — Z8249 Family history of ischemic heart disease and other diseases of the circulatory system: Secondary | ICD-10-CM | POA: Insufficient documentation
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
## 2017-09-15 ENCOUNTER — Emergency Department (HOSPITAL_COMMUNITY): Payer: BLUE CROSS/BLUE SHIELD

## 2017-09-15 ENCOUNTER — Other Ambulatory Visit: Payer: Self-pay

## 2017-09-15 ENCOUNTER — Encounter (HOSPITAL_COMMUNITY): Payer: Self-pay | Admitting: Emergency Medicine

## 2017-09-15 ENCOUNTER — Inpatient Hospital Stay (HOSPITAL_COMMUNITY)
Admission: EM | Admit: 2017-09-15 | Discharge: 2017-09-19 | DRG: 394 | Disposition: A | Discharge status: Home / Self Care | Payer: BLUE CROSS/BLUE SHIELD | Attending: Internal Medicine | Admitting: Internal Medicine

## 2017-09-15 DIAGNOSIS — R609 Edema, unspecified: Secondary | ICD-10-CM | POA: Diagnosis present

## 2017-09-15 DIAGNOSIS — D696 Thrombocytopenia, unspecified: Secondary | ICD-10-CM

## 2017-09-15 DIAGNOSIS — F1120 Opioid dependence, uncomplicated: Secondary | ICD-10-CM | POA: Diagnosis not present

## 2017-09-15 DIAGNOSIS — K6389 Other specified diseases of intestine: Secondary | ICD-10-CM | POA: Diagnosis not present

## 2017-09-15 DIAGNOSIS — G8929 Other chronic pain: Secondary | ICD-10-CM | POA: Diagnosis not present

## 2017-09-15 DIAGNOSIS — E876 Hypokalemia: Secondary | ICD-10-CM | POA: Diagnosis not present

## 2017-09-15 DIAGNOSIS — R042 Hemoptysis: Secondary | ICD-10-CM | POA: Diagnosis not present

## 2017-09-15 DIAGNOSIS — M549 Dorsalgia, unspecified: Secondary | ICD-10-CM | POA: Diagnosis present

## 2017-09-15 DIAGNOSIS — J9811 Atelectasis: Secondary | ICD-10-CM | POA: Diagnosis not present

## 2017-09-15 DIAGNOSIS — I361 Nonrheumatic tricuspid (valve) insufficiency: Secondary | ICD-10-CM | POA: Diagnosis not present

## 2017-09-15 DIAGNOSIS — I2699 Other pulmonary embolism without acute cor pulmonale: Secondary | ICD-10-CM | POA: Diagnosis not present

## 2017-09-15 DIAGNOSIS — K598 Other specified functional intestinal disorders: Secondary | ICD-10-CM | POA: Diagnosis not present

## 2017-09-15 DIAGNOSIS — M1990 Unspecified osteoarthritis, unspecified site: Secondary | ICD-10-CM | POA: Diagnosis present

## 2017-09-15 DIAGNOSIS — K5900 Constipation, unspecified: Secondary | ICD-10-CM

## 2017-09-15 DIAGNOSIS — Z8249 Family history of ischemic heart disease and other diseases of the circulatory system: Secondary | ICD-10-CM

## 2017-09-15 DIAGNOSIS — J9601 Acute respiratory failure with hypoxia: Secondary | ICD-10-CM | POA: Diagnosis not present

## 2017-09-15 DIAGNOSIS — Z720 Tobacco use: Secondary | ICD-10-CM | POA: Diagnosis not present

## 2017-09-15 DIAGNOSIS — Z811 Family history of alcohol abuse and dependence: Secondary | ICD-10-CM | POA: Diagnosis not present

## 2017-09-15 DIAGNOSIS — K5641 Fecal impaction: Secondary | ICD-10-CM | POA: Diagnosis not present

## 2017-09-15 DIAGNOSIS — F988 Other specified behavioral and emotional disorders with onset usually occurring in childhood and adolescence: Secondary | ICD-10-CM | POA: Diagnosis present

## 2017-09-15 DIAGNOSIS — F329 Major depressive disorder, single episode, unspecified: Secondary | ICD-10-CM | POA: Diagnosis not present

## 2017-09-15 DIAGNOSIS — K5909 Other constipation: Secondary | ICD-10-CM | POA: Diagnosis present

## 2017-09-15 DIAGNOSIS — Z930 Tracheostomy status: Secondary | ICD-10-CM | POA: Diagnosis not present

## 2017-09-15 DIAGNOSIS — Z79899 Other long term (current) drug therapy: Secondary | ICD-10-CM | POA: Diagnosis not present

## 2017-09-15 DIAGNOSIS — E039 Hypothyroidism, unspecified: Secondary | ICD-10-CM | POA: Diagnosis present

## 2017-09-15 DIAGNOSIS — K5939 Other megacolon: Secondary | ICD-10-CM | POA: Diagnosis present

## 2017-09-15 DIAGNOSIS — R140 Abdominal distension (gaseous): Secondary | ICD-10-CM | POA: Diagnosis not present

## 2017-09-15 DIAGNOSIS — F419 Anxiety disorder, unspecified: Secondary | ICD-10-CM | POA: Diagnosis present

## 2017-09-15 DIAGNOSIS — R600 Localized edema: Secondary | ICD-10-CM | POA: Diagnosis not present

## 2017-09-15 DIAGNOSIS — E43 Unspecified severe protein-calorie malnutrition: Secondary | ICD-10-CM | POA: Diagnosis not present

## 2017-09-15 DIAGNOSIS — F1220 Cannabis dependence, uncomplicated: Secondary | ICD-10-CM | POA: Diagnosis not present

## 2017-09-15 DIAGNOSIS — K5981 Ogilvie syndrome: Secondary | ICD-10-CM

## 2017-09-15 DIAGNOSIS — I428 Other cardiomyopathies: Secondary | ICD-10-CM | POA: Diagnosis present

## 2017-09-15 DIAGNOSIS — K5649 Other impaction of intestine: Secondary | ICD-10-CM | POA: Diagnosis not present

## 2017-09-15 DIAGNOSIS — Z833 Family history of diabetes mellitus: Secondary | ICD-10-CM | POA: Diagnosis not present

## 2017-09-15 DIAGNOSIS — Z716 Tobacco abuse counseling: Secondary | ICD-10-CM | POA: Diagnosis not present

## 2017-09-15 DIAGNOSIS — Z87820 Personal history of traumatic brain injury: Secondary | ICD-10-CM | POA: Diagnosis not present

## 2017-09-15 DIAGNOSIS — K5931 Toxic megacolon: Secondary | ICD-10-CM | POA: Diagnosis not present

## 2017-09-15 DIAGNOSIS — F332 Major depressive disorder, recurrent severe without psychotic features: Secondary | ICD-10-CM | POA: Diagnosis present

## 2017-09-15 DIAGNOSIS — Z791 Long term (current) use of non-steroidal anti-inflammatories (NSAID): Secondary | ICD-10-CM | POA: Diagnosis not present

## 2017-09-15 DIAGNOSIS — F17210 Nicotine dependence, cigarettes, uncomplicated: Secondary | ICD-10-CM | POA: Diagnosis present

## 2017-09-15 HISTORY — DX: Other chronic pain: G89.29

## 2017-09-15 HISTORY — DX: Dorsalgia, unspecified: M54.9

## 2017-09-15 LAB — MAGNESIUM: MAGNESIUM: 2.1 mg/dL (ref 1.7–2.4)

## 2017-09-15 LAB — COMPREHENSIVE METABOLIC PANEL
ALT: 12 U/L (ref 0–44)
AST: 14 U/L — ABNORMAL LOW (ref 15–41)
Albumin: 4.1 g/dL (ref 3.5–5.0)
Alkaline Phosphatase: 79 U/L (ref 38–126)
Anion gap: 7 (ref 5–15)
BUN: 7 mg/dL (ref 6–20)
CO2: 27 mmol/L (ref 22–32)
Calcium: 9.2 mg/dL (ref 8.9–10.3)
Chloride: 105 mmol/L (ref 98–111)
Creatinine, Ser: 0.89 mg/dL (ref 0.61–1.24)
GFR calc Af Amer: >60 mL/min (ref >60)
GFR calc non Af Amer: >60 mL/min (ref >60)
Glucose, Bld: 115 mg/dL — ABNORMAL HIGH (ref 70–99)
Potassium: 3.2 mmol/L — ABNORMAL LOW (ref 3.5–5.1)
Sodium: 139 mmol/L (ref 135–145)
Total Bilirubin: 0.7 mg/dL (ref 0.3–1.2)
Total Protein: 7.4 g/dL (ref 6.5–8.1)

## 2017-09-15 LAB — CBC WITH DIFFERENTIAL/PLATELET
Basophils Absolute: 0 10*3/uL (ref 0.0–0.1)
Basophils Relative: 0 %
Eosinophils Absolute: 0.1 10*3/uL (ref 0.0–0.7)
Eosinophils Relative: 2 %
HCT: 43.8 % (ref 39.0–52.0)
Hemoglobin: 14.7 g/dL (ref 13.0–17.0)
Lymphocytes Relative: 24 %
Lymphs Abs: 1.6 10*3/uL (ref 0.7–4.0)
MCH: 28.5 pg (ref 26.0–34.0)
MCHC: 33.6 g/dL (ref 30.0–36.0)
MCV: 85 fL (ref 78.0–100.0)
Monocytes Absolute: 0.6 10*3/uL (ref 0.1–1.0)
Monocytes Relative: 9 %
Neutro Abs: 4.5 10*3/uL (ref 1.7–7.7)
Neutrophils Relative %: 65 %
Platelets: 146 10*3/uL — ABNORMAL LOW (ref 150–400)
RBC: 5.15 MIL/uL (ref 4.22–5.81)
RDW: 14.6 % (ref 11.5–15.5)
WBC: 6.9 10*3/uL (ref 4.0–10.5)

## 2017-09-15 LAB — PHOSPHORUS: PHOSPHORUS: 3.3 mg/dL (ref 2.5–4.6)

## 2017-09-15 MED ORDER — POTASSIUM CHLORIDE IN NACL 20-0.45 MEQ/L-% IV SOLN
INTRAVENOUS | Status: AC
  Administered 2017-09-15: 22:00:00 via INTRAVENOUS
  Filled 2017-09-15 (×5): qty 1000

## 2017-09-15 MED ORDER — TRAZODONE HCL 50 MG PO TABS
100.0000 mg | ORAL_TABLET | Freq: Every day | ORAL | Status: DC
  Administered 2017-09-16 – 2017-09-18 (×4): 100 mg via ORAL
  Filled 2017-09-15 (×4): qty 2

## 2017-09-15 MED ORDER — POTASSIUM CHLORIDE IN NACL 20-0.9 MEQ/L-% IV SOLN
INTRAVENOUS | Status: DC
  Administered 2017-09-15 – 2017-09-19 (×8): via INTRAVENOUS
  Filled 2017-09-15 (×2): qty 1000

## 2017-09-15 MED ORDER — CYCLOBENZAPRINE HCL 10 MG PO TABS
10.0000 mg | ORAL_TABLET | Freq: Three times a day (TID) | ORAL | Status: DC | PRN
  Administered 2017-09-16 – 2017-09-19 (×4): 10 mg via ORAL
  Filled 2017-09-15 (×4): qty 1

## 2017-09-15 MED ORDER — POTASSIUM CHLORIDE CRYS ER 10 MEQ PO TBCR: 10.0000 meq | EXTENDED_RELEASE_TABLET | Freq: Every day | ORAL | Status: DC | PRN

## 2017-09-15 MED ORDER — ONDANSETRON HCL 4 MG PO TABS: 4.0000 mg | ORAL_TABLET | Freq: Four times a day (QID) | ORAL | Status: DC | PRN

## 2017-09-15 MED ORDER — HEPARIN SODIUM (PORCINE) 5000 UNIT/ML IJ SOLN
5000.0000 [IU] | Freq: Three times a day (TID) | INTRAMUSCULAR | Status: DC
  Administered 2017-09-16: 5000 [IU] via SUBCUTANEOUS
  Filled 2017-09-15: qty 1

## 2017-09-15 MED ORDER — ONDANSETRON HCL 4 MG/2ML IJ SOLN: 4.0000 mg | Freq: Four times a day (QID) | INTRAMUSCULAR | Status: DC | PRN

## 2017-09-15 MED ORDER — DIVALPROEX SODIUM 250 MG PO DR TAB
500.0000 mg | DELAYED_RELEASE_TABLET | Freq: Two times a day (BID) | ORAL | Status: DC
  Administered 2017-09-16 – 2017-09-19 (×8): 500 mg via ORAL
  Filled 2017-09-15 (×8): qty 2

## 2017-09-15 MED ORDER — GABAPENTIN 300 MG PO CAPS
600.0000 mg | ORAL_CAPSULE | Freq: Three times a day (TID) | ORAL | Status: DC
  Administered 2017-09-16 – 2017-09-19 (×11): 600 mg via ORAL
  Filled 2017-09-15 (×11): qty 2

## 2017-09-15 MED ORDER — CITALOPRAM HYDROBROMIDE 20 MG PO TABS
40.0000 mg | ORAL_TABLET | Freq: Every day | ORAL | Status: DC
  Administered 2017-09-16 – 2017-09-19 (×4): 40 mg via ORAL
  Filled 2017-09-15 (×4): qty 2

## 2017-09-15 MED ORDER — MILK AND MOLASSES ENEMA
1.0000 | Freq: Once | RECTAL | Status: AC | PRN
  Administered 2017-09-16: 250 mL via RECTAL
  Filled 2017-09-15: qty 250

## 2017-09-15 MED ORDER — TRAMADOL HCL 50 MG PO TABS
50.0000 mg | ORAL_TABLET | Freq: Two times a day (BID) | ORAL | Status: DC | PRN
  Filled 2017-09-15: qty 1

## 2017-09-15 MED ORDER — ACETAMINOPHEN 650 MG RE SUPP: 650.0000 mg | Freq: Four times a day (QID) | RECTAL | Status: DC | PRN

## 2017-09-15 MED ORDER — KETOROLAC TROMETHAMINE 30 MG/ML IJ SOLN
15.0000 mg | Freq: Once | INTRAMUSCULAR | Status: AC
  Administered 2017-09-15: 15 mg via INTRAVENOUS
  Filled 2017-09-15: qty 1

## 2017-09-15 MED ORDER — LORAZEPAM 2 MG/ML IJ SOLN
1.0000 mg | Freq: Once | INTRAMUSCULAR | Status: AC
  Administered 2017-09-15: 1 mg via INTRAVENOUS
  Filled 2017-09-15: qty 1

## 2017-09-15 MED ORDER — IOPAMIDOL (ISOVUE-300) INJECTION 61%
100.0000 mL | Freq: Once | INTRAVENOUS | Status: AC | PRN
  Administered 2017-09-15: 100 mL via INTRAVENOUS

## 2017-09-15 MED ORDER — BUSPIRONE HCL 5 MG PO TABS
15.0000 mg | ORAL_TABLET | Freq: Three times a day (TID) | ORAL | Status: DC
  Administered 2017-09-16 – 2017-09-19 (×11): 15 mg via ORAL
  Filled 2017-09-15 (×11): qty 3

## 2017-09-15 MED ORDER — QUETIAPINE FUMARATE 100 MG PO TABS
800.0000 mg | ORAL_TABLET | Freq: Every day | ORAL | Status: DC
  Administered 2017-09-16 – 2017-09-18 (×4): 800 mg via ORAL
  Filled 2017-09-15 (×4): qty 8

## 2017-09-15 MED ORDER — ACETAMINOPHEN 325 MG PO TABS
650.0000 mg | ORAL_TABLET | Freq: Four times a day (QID) | ORAL | Status: DC | PRN
  Administered 2017-09-17 – 2017-09-18 (×2): 650 mg via ORAL
  Filled 2017-09-15 (×2): qty 2

## 2017-09-15 NOTE — Progress Notes (Signed)
According to Patient and his mother, after several failed enemas at home, patient is refusing anymore enemas tonight. He states his rectum is sore and hurting and wants to wait until tomorrow. Risks and benefits of the decision has been discussed with both the patient and his mother who seems to play an advocate type role in his health care decisions.

## 2017-09-15 NOTE — ED Triage Notes (Signed)
Pt states he has on had squirts of bm's x 3 weeks. Has not had normal bm in 3 weeks. Pt has prevalent abd distention noted. Pt has ble swelling which pt states is side effect of gabapentin and has been intermittent x 4 months. Pt pale to face. Red around eyes.
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
## 2017-09-15 NOTE — ED Provider Notes (Signed)
[facility] EMERGENCY DEPARTMENT Provider Note   CSN: [id number] Arrival date & time: 09/15/17  1800     History   Chief Complaint Chief Complaint  Patient presents with  . Abdominal Pain    HPI Philip Thompson is a 27 y.o. male.  HPI   27yM with abdominal pain and distension. Increasing over several weeks. Constipation. Reports last significant bowel movement at least three weeks ago. Reports he is having some "squirts" of watery stool. Increasingly discomfort. Reports having some difficulty urinating. Mild nausea. No vomiting. No prior abdominal surgery.   Past Medical History:  Diagnosis Date  . ADD (attention deficit disorder)   . Allergic rhinitis   . Anxiety   . Arthritis    joint pain  . Chronic back pain   . Frontal head injury   . Heroin overdose [facility])     Patient Active Problem List   Diagnosis Date Noted  . Traumatic brain injury (HCC) 04/19/2017  . Mild cognitive disorder 04/19/2017  . MDD (major depressive disorder), recurrent severe, without psychosis (HCC) 03/25/2017  . Polysubstance abuse (HCC) 10/15/2016  . Substance or medication-induced bipolar and related disorder with onset during intoxication (HCC) 07/05/2016  . Opioid use disorder, moderate, dependence (HCC) 07/05/2016  . Cannabis use disorder, severe, dependence (HCC) 07/05/2016  . Chronic back pain 07/05/2012    Past Surgical History:  Procedure Laterality Date  . FOOT SURGERY     to get a BB out  . NO PAST SURGERIES          Home Medications    Prior to Admission medications   Medication Sig Start Date End Date Taking? Authorizing Provider  busPIRone (BUSPAR) 15 MG tablet Take 1 tablet (15 mg total) by mouth 3 (three) times daily. 09/07/17   Eksir, Bo Mcclintock, MD  citalopram (CELEXA) 40 MG tablet Take 1 tablet (40 mg total) by mouth daily. 09/07/17 09/07/18  Burnard Leigh, MD  cyclobenzaprine (FLEXERIL) 10 MG tablet Take 1 tablet (10 mg total) by mouth 3 (three) times  daily as needed for muscle spasms. 03/29/17   Armandina Stammer I, NP  divalproex (DEPAKOTE) 500 MG DR tablet Take 1 tablet (500 mg total) by mouth every 12 (twelve) hours. For mood stabilization 09/07/17   Eksir, Bo Mcclintock, MD  furosemide (LASIX) 20 MG tablet Take 1 tablet (20 mg total) by mouth daily. Prn leg swelling 05/11/17   Campbell Riches, NP  gabapentin (NEURONTIN) 300 MG capsule Take 2 capsules (600 mg total) by mouth 3 (three) times daily. For agitation 09/07/17 09/07/18  Eksir, Bo Mcclintock, MD  ibuprofen (ADVIL,MOTRIN) 600 MG tablet Take 1 tablet (600 mg total) by mouth every 6 (six) hours as needed for moderate pain. (May buy from over the counter): For pain 03/29/17   Armandina Stammer I, NP  potassium chloride (K-DUR,KLOR-CON) 10 MEQ tablet Take 1 tablet (10 mEq total) by mouth daily. With furosemide 05/11/17   Campbell Riches, NP  QUEtiapine (SEROQUEL) 400 MG tablet Take 2 tablets (800 mg total) by mouth at bedtime. 09/07/17   Burnard Leigh, MD  traMADol Janean Sark) 50 MG tablet  05/03/17   [provider]  traZODone (DESYREL) 100 MG tablet Take 1 tablet (100 mg total) by mouth at bedtime. 09/07/17   Eksir, Bo Mcclintock, MD    Family History Family History  Problem Relation Age of Onset  . Dementia Father   . Alcohol abuse Father   . Diabetes Brother   . Cancer -  Ovarian Maternal Grandmother   . Cancer - Lung Paternal Grandmother   . Cancer Paternal Grandfather   . Diabetes Other   . Hypertension Other   . Heart disease Other   . Heart attack Other     Social History Social History   Tobacco Use  . Smoking status: Current Every Day Smoker    Packs/day: 1.00    Years: 7.00    Pack years: 7.00    Types: Cigarettes  . Smokeless tobacco: Never Used  Substance Use Topics  . Alcohol use: Not Currently    Comment: hx of. last use 01/2016  . Drug use: Yes    Types: Marijuana    Comment: last used marijuana 3 weeks ago. heroin last used  6 months ago.       Allergies   Patient has no known allergies.   Review of Systems Review of Systems  All systems reviewed and negative, other than as noted in HPI.  Physical Exam Updated Vital Signs BP 131/87   Pulse (!) 106   Temp 97.8 F (36.6 C) (Oral)   Resp 15   SpO2 96%   Physical Exam  Constitutional: He appears well-developed and well-nourished. No distress.  HENT:  Head: Normocephalic and atraumatic.  Eyes: Conjunctivae are normal. Right eye exhibits no discharge. Left eye exhibits no discharge.  Neck: Neck supple.  Cardiovascular: Normal rate, regular rhythm and normal heart sounds. Exam reveals no gallop and no friction rub.  No murmur heard. Pulmonary/Chest: Effort normal and breath sounds normal. No respiratory distress.  Abdominal: He exhibits distension. There is tenderness.  Massive distension. Tense. Diffuse tenderness which seems mild to moderate. No peritonitis.   Genitourinary:  Genitourinary Comments: DRE: poor rectal tone. Hard stool palpated high in rectum. I was unable to remove any significant amount. There was some leakage of watery brown stool.   Musculoskeletal: He exhibits no edema or tenderness.  Neurological: He is alert.  Skin: Skin is warm and dry.  Psychiatric: He has a normal mood and affect. His behavior is normal. Thought content normal.  Nursing note and vitals reviewed.    ED Treatments / Results  Labs (all labs ordered are listed, but only abnormal results are displayed) Labs Reviewed  CBC WITH DIFFERENTIAL/PLATELET - Abnormal; Notable for the following components:      Result Value   Platelets 146 (*)    All other components within normal limits  COMPREHENSIVE METABOLIC PANEL - Abnormal; Notable for the following components:   Potassium 3.2 (*)    Glucose, Bld 115 (*)    AST 14 (*)    All other components within normal limits    EKG None  Radiology Ct Abdomen Pelvis W Contrast  Result Date: 09/15/2017 CLINICAL DATA:  Abnormal  bowel movements for 3 weeks. Abdominal distension. Abdominal discomfort. EXAM: CT ABDOMEN AND PELVIS WITH CONTRAST TECHNIQUE: Multidetector CT imaging of the abdomen and pelvis was performed using the standard protocol following bolus administration of intravenous contrast. CONTRAST:  ISOVUE-300 IOPAMIDOL (ISOVUE-300) INJECTION 61% COMPARISON:  None. FINDINGS: Lower chest: No acute findings. Hepatobiliary: No focal liver abnormality is seen. No gallstones, gallbladder wall thickening, or biliary dilatation. Pancreas: Unremarkable. No pancreatic ductal dilatation or surrounding inflammatory changes. Spleen: Normal in size without focal abnormality. Adrenals/Urinary Tract: Adrenal glands are unremarkable. Kidneys are normal, without renal calculi, focal lesion, or hydronephrosis. Bladder is displaced anteriorly. Stomach/Bowel: Massive distention of the rectal vault by stool. Additional severe distension of the remainder of the colon with  stool. No dilated small bowel loops seen. Vascular/Lymphatic: No significant vascular findings are present. No enlarged abdominal or pelvic lymph nodes. Mildly prominent lymph nodes within the bilateral inguinal regions. Reproductive: No obvious abnormality. Other: No free fluid or abscess collection identified. No free intraperitoneal air. Musculoskeletal: No acute findings. Bilateral chronic pars interarticularis defects at the L5-S1 level with associated grade 1 spondylolisthesis. IMPRESSION: 1. Massive distension of the rectal vault and rectosigmoid colon by mostly stool and some gas, compatible with impaction and/or severe constipation. Additional fairly marked distension of the remainder of the colon, filled with stool throughout, again compatible with impaction and/or severe constipation. 2. No small bowel dilatation appreciated. 3. Majority of the intra-abdominal organs are displaced to some degree by mass effect from the distended colon. Bladder is displaced anteriorly  by the massively distended rectosigmoid colon. Electronically Signed   By: Bary Richard M.D.   On: 09/15/2017 19:58    Procedures Procedures (including critical care time)  Medications Ordered in ED Medications  iopamidol (ISOVUE-300) 61 % injection 100 mL (100 mLs Intravenous Contrast Given 09/15/17 1918)     Initial Impression / Assessment and Plan / ED Course   I have reviewed the triage vital signs and the nursing notes.  Pertinent labs & imaging results that were available during my care of the patient were reviewed by me and considered in my medical decision making (see chart for details).     27yM with increasing abdominal pain/distension. Massive rectal/colonic distension. Probably from chronic constipation. I have seen him as a patient previously for unrelated reasons and know he has a history of opiate abuse. He is prescribed tramadol. He denies any other pain or illicit opiate use otherwise recently. I tried to manually disimpact but was unable to remove any significant amount. Will admit and try enemas overnight. He is afebrile. No peritonitis. No leukocytosis. CT w/o evidence of perforation. Discussed briefly with Dr Darrick Penna, GI. No emergent need to be seen tonight. Will see in consultation tomorrow.   Final Clinical Impressions(s) / ED Diagnoses   Final diagnoses:  Megacolon  Constipation, unspecified constipation type    ED Discharge Orders    None       Raeford Razor, MD 09/15/17 2136

## 2017-09-16 ENCOUNTER — Observation Stay (HOSPITAL_COMMUNITY): Payer: BLUE CROSS/BLUE SHIELD

## 2017-09-16 DIAGNOSIS — F332 Major depressive disorder, recurrent severe without psychotic features: Secondary | ICD-10-CM

## 2017-09-16 DIAGNOSIS — F1120 Opioid dependence, uncomplicated: Secondary | ICD-10-CM

## 2017-09-16 DIAGNOSIS — R600 Localized edema: Secondary | ICD-10-CM

## 2017-09-16 DIAGNOSIS — Z720 Tobacco use: Secondary | ICD-10-CM | POA: Diagnosis not present

## 2017-09-16 DIAGNOSIS — K5900 Constipation, unspecified: Secondary | ICD-10-CM

## 2017-09-16 DIAGNOSIS — K5931 Toxic megacolon: Secondary | ICD-10-CM | POA: Diagnosis not present

## 2017-09-16 DIAGNOSIS — E876 Hypokalemia: Secondary | ICD-10-CM | POA: Diagnosis not present

## 2017-09-16 DIAGNOSIS — F1220 Cannabis dependence, uncomplicated: Secondary | ICD-10-CM | POA: Diagnosis not present

## 2017-09-16 LAB — BASIC METABOLIC PANEL
ANION GAP: 7 (ref 5–15)
BUN: 8 mg/dL (ref 6–20)
CALCIUM: 9 mg/dL (ref 8.9–10.3)
CO2: 24 mmol/L (ref 22–32)
Chloride: 109 mmol/L (ref 98–111)
Creatinine, Ser: 0.84 mg/dL (ref 0.61–1.24)
GFR calc non Af Amer: >60 mL/min (ref >60)
GLUCOSE: 101 mg/dL — AB (ref 70–99)
Potassium: 4.1 mmol/L (ref 3.5–5.1)
Sodium: 140 mmol/L (ref 135–145)

## 2017-09-16 LAB — MRSA PCR SCREENING: MRSA by PCR: NEGATIVE

## 2017-09-16 MED ORDER — LIDOCAINE 5 % EX OINT
TOPICAL_OINTMENT | Freq: Once | CUTANEOUS | Status: AC
  Administered 2017-09-16: 11:00:00 via TOPICAL
  Filled 2017-09-16: qty 35.44

## 2017-09-16 MED ORDER — NICOTINE 21 MG/24HR TD PT24
21.0000 mg | MEDICATED_PATCH | Freq: Every day | TRANSDERMAL | Status: DC | PRN
  Administered 2017-09-17 – 2017-09-18 (×2): 21 mg via TRANSDERMAL
  Filled 2017-09-16 (×2): qty 1

## 2017-09-16 MED ORDER — MAGNESIUM CITRATE PO SOLN: 1.0000 | Freq: Once | ORAL | Status: DC

## 2017-09-16 NOTE — Consult Note (Signed)
Referring Provider: [facility] Primary Care Physician:  [name], MD Primary Gastroenterologist:  Dr. Jena Gauss (previously unassigned)  Date of Admission: 09/15/17 Date of Consultation: 09/16/17  Reason for Consultation: Obstipation, fecal impaction  HPI:  Philip Thompson is a 27 y.o. male with a past medical history of ADD, anxiety, polysubstance abuse, drug-induced cardiomyopathy.  Emergency department physician and hospitalist notes reviewed.  Per these notes patient has a history of struggles with constipation and noted imaging studies as far back as 2012 show a large amount of stool burden in the rectosigmoid colon and elsewhere.  No treatment sought for constipation, no stool softener or other remedies used at home.  Typically 2-3 bowel movements a week but if this point he states his last bowel movement was about 3 weeks ago.  He is tried an enema at home 3 times without effect.  No fever, nausea, vomiting, diarrhea.  He does have "squirts" of liquid stool.  He presented to the emergency department with complaints of worsening abdominal distention and discomfort.  CT of the abdomen and pelvis was completed and showed massive distention of the rectal vault and rectosigmoid colon by stool and some gas compatible with impaction and/or severe constipation.  Fairly marked distention with the remainder of the colon filled with stool throughout.  No small bowel dilation.  Majority of intra-abdominal organs are displaced by mass-effect from the descending colon.  Patient refused enemas both last night and this morning despite risks of possible need for surgery if worsening.  Query need for possible colectomy and general surgery has been consulted as has GI.  Currently magnesium citrate 1 bottle is ordered as his milk of molasses enema, although the patient has refused enemas.  Labs in the emergency department show CBC essentially normal other than mildly suppressed platelet count 146 with  some history of this approximately 1 year ago.  CMP normal other than mild hypokalemia at 3.2.  Today he states he's feeling ok. He confirms the above histories. Denies significant abdominal pain. His Mom is bedside and states he has previously been having abdominal pain. No knows his abdomen is distended and feels full. Denies N/V, fever, chills. Admits recent LE edema which he attributes to gabapentin. Denies any other GI symptoms at this time.  Past Medical History:  Diagnosis Date  . ADD (attention deficit disorder)   . Allergic rhinitis   . Anxiety   . Arthritis    joint pain  . Chronic back pain   . Frontal head injury   . Heroin overdose [facility])     Past Surgical History:  Procedure Laterality Date  . FOOT SURGERY     to get a BB out  . NO PAST SURGERIES      Prior to Admission medications   Medication Sig Start Date End Date Taking? Authorizing Provider  busPIRone (BUSPAR) 15 MG tablet Take 1 tablet (15 mg total) by mouth 3 (three) times daily. 09/07/17  Yes Eksir, Bo Mcclintock, MD  citalopram (CELEXA) 40 MG tablet Take 1 tablet (40 mg total) by mouth daily. 09/07/17 09/07/18 Yes Eksir, Bo Mcclintock, MD  cyclobenzaprine (FLEXERIL) 10 MG tablet Take 1 tablet (10 mg total) by mouth 3 (three) times daily as needed for muscle spasms. 03/29/17  Yes Armandina Stammer I, NP  divalproex (DEPAKOTE) 500 MG DR tablet Take 1 tablet (500 mg total) by mouth every 12 (twelve) hours. For mood stabilization 09/07/17  Yes Eksir, Bo Mcclintock, MD  furosemide (LASIX) 20 MG tablet Take  1 tablet (20 mg total) by mouth daily. Prn leg swelling Patient taking differently: Take 20 mg by mouth daily as needed for fluid or edema. As needed for leg swelling 05/11/17  Yes Sherie Don C, NP  gabapentin (NEURONTIN) 300 MG capsule Take 2 capsules (600 mg total) by mouth 3 (three) times daily. For agitation 09/07/17 09/07/18 Yes Eksir, Bo Mcclintock, MD  ibuprofen (ADVIL,MOTRIN) 600 MG tablet Take 1 tablet (600  mg total) by mouth every 6 (six) hours as needed for moderate pain. (May buy from over the counter): For pain 03/29/17  Yes Armandina Stammer I, NP  potassium chloride (K-DUR,KLOR-CON) 10 MEQ tablet Take 1 tablet (10 mEq total) by mouth daily. With furosemide Patient taking differently: Take 10 mEq by mouth daily as needed (only takes when Lasix (Furosemide)). With furosemide 05/11/17  Yes Campbell Riches, NP  QUEtiapine (SEROQUEL) 400 MG tablet Take 2 tablets (800 mg total) by mouth at bedtime. 09/07/17  Yes Eksir, Bo Mcclintock, MD  traMADol (ULTRAM) 50 MG tablet Take 50 mg by mouth every 12 (twelve) hours as needed for moderate pain or severe pain.  05/03/17  Yes [provider]  traZODone (DESYREL) 100 MG tablet Take 1 tablet (100 mg total) by mouth at bedtime. 09/07/17  Yes Eksir, Bo Mcclintock, MD    Current Facility-Administered Medications  Medication Dose Route Frequency Provider Last Rate Last Dose  . 0.9 % NaCl with KCl 20 mEq/ L  infusion   Intravenous Continuous Bobette Mo, MD 125 mL/hr at 09/15/17 2353    . acetaminophen (TYLENOL) tablet 650 mg  650 mg Oral Q6H PRN Bobette Mo, MD       Or  . acetaminophen (TYLENOL) suppository 650 mg  650 mg Rectal Q6H PRN Bobette Mo, MD      . busPIRone (BUSPAR) tablet 15 mg  15 mg Oral TID Bobette Mo, MD   15 mg at 09/16/17 0018  . citalopram (CELEXA) tablet 40 mg  40 mg Oral Daily Bobette Mo, MD      . cyclobenzaprine [facility]) tablet 10 mg  10 mg Oral TID PRN Bobette Mo, MD      . divalproex (DEPAKOTE) DR tablet 500 mg  500 mg Oral Q12H Bobette Mo, MD   500 mg at 09/16/17 0019  . gabapentin (NEURONTIN) capsule 600 mg  600 mg Oral TID Bobette Mo, MD   600 mg at 09/16/17 0018  . heparin injection 5,000 Units  5,000 Units Subcutaneous Q8H Bobette Mo, MD   5,000 Units at 09/16/17 0018  . magnesium citrate solution 1 Bottle  1 Bottle Oral Once Bobette Mo,  MD      . milk and molasses enema  1 enema Rectal Once PRN Bobette Mo, MD      . nicotine (NICODERM CQ - dosed in mg/24 hours) patch 21 mg  21 mg Transdermal Daily PRN Bobette Mo, MD      . ondansetron [facility]) tablet 4 mg  4 mg Oral Q6H PRN Bobette Mo, MD       Or  . ondansetron [facility]) injection 4 mg  4 mg Intravenous Q6H PRN Bobette Mo, MD      . potassium chloride (K-DUR,KLOR-CON) CR tablet 10 mEq  10 mEq Oral Daily PRN Bobette Mo, MD      . QUEtiapine (SEROQUEL) tablet 800 mg  800 mg Oral QHS Bobette Mo, MD   800 mg  at 09/16/17 0050  . traZODone (DESYREL) tablet 100 mg  100 mg Oral QHS Bobette Mo, MD   100 mg at 09/16/17 0018    Allergies as of 09/15/2017  . (No Known Allergies)    Family History  Problem Relation Age of Onset  . Dementia Father   . Alcohol abuse Father   . Diabetes Brother   . Cancer - Ovarian Maternal Grandmother   . Cancer - Lung Paternal Grandmother   . Cancer Paternal Grandfather   . Diabetes Other   . Hypertension Other   . Heart disease Other   . Heart attack Other     Social History   Socioeconomic History  . Marital status: Single    Spouse name: Not on file  . Number of children: Not on file  . Years of education: Not on file  . Highest education level: 12th grade  Occupational History  . Not on file  Social Needs  . Financial resource strain: Somewhat hard  . Food insecurity:    Worry: Never true    Inability: Never true  . Transportation needs:    Medical: No    Non-medical: No  Tobacco Use  . Smoking status: Current Every Day Smoker    Packs/day: 1.00    Years: 7.00    Pack years: 7.00    Types: Cigarettes  . Smokeless tobacco: Never Used  Substance and Sexual Activity  . Alcohol use: Not Currently    Comment: hx of. last use 01/2016  . Drug use: Yes    Types: Marijuana    Comment: last used marijuana 3 weeks ago. heroin last used  6 months ago.   Marland Kitchen Sexual  activity: Yes    Birth control/protection: Condom  Lifestyle  . Physical activity:    Days per week: 0 days    Minutes per session: 0 min  . Stress: To some extent  Relationships  . Social connections:    Talks on phone: More than three times a week    Gets together: Twice a week    Attends religious service: Never    Active member of club or organization: No    Attends meetings of clubs or organizations: Never    Relationship status: Never married  . Intimate partner violence:    Fear of current or ex partner: No    Emotionally abused: No    Physically abused: No    Forced sexual activity: No  Other Topics Concern  . Not on file  Social History Narrative  . Not on file    Review of Systems: General: Negative for anorexia, weight loss, fever, chills, fatigue, weakness. ENT: Negative for hoarseness, difficulty swallowing. CV: Negative for chest pain, angina, palpitations, peripheral edema.  Respiratory: Negative for dyspnea at rest, cough, sputum, wheezing.  GI: See history of present illness. Derm: Negative for rash or itching.  Endo: Negative for unusual weight change.  Heme: Negative for bruising or bleeding. Allergy: Negative for rash or hives.  Physical Exam: Vital signs in last 24 hours: Temp:  [97.6 F (36.4 C)-98.3 F (36.8 C)] 98.3 F (36.8 C) (08/30 0829) Pulse Rate:  [102-132] 103 (08/30 0300) Resp:  [15-20] 20 (08/30 0000) BP: (116-138)/(74-94) 123/83 (08/30 0300) SpO2:  [95 %-99 %] 95 % (08/30 0300) Weight:  [91.7 kg] 91.7 kg (08/30 0500) Last BM Date: 08/18/17 General:   Alert,  Well-developed, well-nourished, pleasant and cooperative in NAD Head:  Normocephalic and atraumatic. Eyes:  Sclera clear, no icterus.  Conjunctiva pink. Ears:  Normal auditory acuity. Neck:  Supple; no masses or thyromegaly. Lungs:  Clear throughout to auscultation.   No wheezes, crackles, or rhonchi. No acute distress. Heart:  Regular rate and rhythm; no murmurs, clicks,  rubs,  or gallops. Abdomen:  Firm, significantly distended, with TTP noted. Unable to evaluate for  masses, hepatosplenomegaly or hernias due to distension. Normal bowel sounds, without guarding, and without rebound.   Rectal:  Deferred.   Msk:  Symmetrical without gross deformities. Pulses:  Normal bilateral DP pulses noted. Extremities:  Without clubbing. 2-3+ edema noted bilateral LE Neurologic:  Alert and  oriented x4;  grossly normal neurologically. Skin:  Intact without significant lesions or rashes. Psych:  Alert and cooperative. Normal mood and affect.  Intake/Output from previous day: 08/29 0701 - 08/30 0700 In: 1022.9 [I.V.:1022.9] Out: -  Intake/Output this shift: No intake/output data recorded.  Lab Results: Recent Labs    09/15/17 1828  WBC 6.9  HGB 14.7  HCT 43.8  PLT 146*   BMET Recent Labs    09/15/17 1828 09/16/17 0735  NA 139 140  K 3.2* 4.1  CL 105 109  CO2 27 24  GLUCOSE 115* 101*  BUN 7 8  CREATININE 0.89 0.84  CALCIUM 9.2 9.0   LFT Recent Labs    09/15/17 1828  PROT 7.4  ALBUMIN 4.1  AST 14*  ALT 12  ALKPHOS 79  BILITOT 0.7   PT/INR No results for input(s): LABPROT, INR in the last 72 hours. Hepatitis Panel No results for input(s): HEPBSAG, HCVAB, HEPAIGM, HEPBIGM in the last 72 hours. C-Diff No results for input(s): CDIFFTOX in the last 72 hours.  Studies/Results: Ct Abdomen Pelvis W Contrast  Result Date: 09/15/2017 CLINICAL DATA:  Abnormal bowel movements for 3 weeks. Abdominal distension. Abdominal discomfort. EXAM: CT ABDOMEN AND PELVIS WITH CONTRAST TECHNIQUE: Multidetector CT imaging of the abdomen and pelvis was performed using the standard protocol following bolus administration of intravenous contrast. CONTRAST:  ISOVUE-300 IOPAMIDOL (ISOVUE-300) INJECTION 61% COMPARISON:  None. FINDINGS: Lower chest: No acute findings. Hepatobiliary: No focal liver abnormality is seen. No gallstones, gallbladder wall thickening, or  biliary dilatation. Pancreas: Unremarkable. No pancreatic ductal dilatation or surrounding inflammatory changes. Spleen: Normal in size without focal abnormality. Adrenals/Urinary Tract: Adrenal glands are unremarkable. Kidneys are normal, without renal calculi, focal lesion, or hydronephrosis. Bladder is displaced anteriorly. Stomach/Bowel: Massive distention of the rectal vault by stool. Additional severe distension of the remainder of the colon with stool. No dilated small bowel loops seen. Vascular/Lymphatic: No significant vascular findings are present. No enlarged abdominal or pelvic lymph nodes. Mildly prominent lymph nodes within the bilateral inguinal regions. Reproductive: No obvious abnormality. Other: No free fluid or abscess collection identified. No free intraperitoneal air. Musculoskeletal: No acute findings. Bilateral chronic pars interarticularis defects at the L5-S1 level with associated grade 1 spondylolisthesis. IMPRESSION: 1. Massive distension of the rectal vault and rectosigmoid colon by mostly stool and some gas, compatible with impaction and/or severe constipation. Additional fairly marked distension of the remainder of the colon, filled with stool throughout, again compatible with impaction and/or severe constipation. 2. No small bowel dilatation appreciated. 3. Majority of the intra-abdominal organs are displaced to some degree by mass effect from the distended colon. Bladder is displaced anteriorly by the massively distended rectosigmoid colon. Electronically Signed   By: Bary Richard M.D.   On: 09/15/2017 19:58    Impression: 27 year old male with history of polysubstance abuse and underlying chronic constipation,  like OIC from opioids, presented to the ER with complaints of no BM x 3 weeks despite enemas. Not on chronic treatment of constipation. Also with associated abdominal pain and distension. CT with massive distension of the rectal vault and rectosigmoid colon compatible with  impaction and likely obstipation; remaining colon with significant stool burden as well. Noted internal organ mass effect from degree of distension. The patient was admitted and ordered enemas, which he has been refusing.   Today he remains significantly distended with a firm abdomen and tenderness on exam. Surgery has been consulted as well for backup/possible colectomy pending progression. Discussed the degree of severity of his condition with the patient and his mother and the need to disimpact the patinet. Digital disimpaction in the ER failed. The patient did agree with enemas, offered topical lidocain per rectum to aid in rectal discomfort.  He will eventually need chronic treatment of constipation, likely OIC. Given opiates and amounts variable in the setting of polysubstance abuse I feel Amitiza may be a better initial choice over Movantic (which needs to be stopped with cessation of opioids).  Plan: 1. Topical lidocaine to rectum with enema 2. Proceed with enema 3. Will eventually need chronic constipation management 4. May eventually need colectomy pending results and progression - surgery following 5. If enemas unsucessful, can reattempt some digital disimpaction and possibly neostigmine with close monitoring 6. May need flex sig pending results 7. Supportive measures   Thank you for allowing [place] to participate in the care of Malosi L Gieselman  Wynne Dust, DNP, AGNP-C Adult & Gerontological Nurse Practitioner [facility]      LOS: 0 days     09/16/2017, 8:37 AM

## 2017-09-16 NOTE — Progress Notes (Signed)
PROGRESS NOTE  CHEO BANAAG OBS:[id number] DOB: 1990/06/25 DOA: 09/15/2017 PCP: [name], MD  Brief History:  27 year old male with a history of chronic back pain, substance-induced mood disorder, polysubstance abuse including previous history of heroin, cocaine, THC, tobacco, alcohol, TBI, drug-induced cardiomyopathy presenting with 3-week history of increasing abdominal distention.  The patient has had chronic struggles with constipation.  He has had multiple imaging studies dating back to 07/23/2010 which has shown a large amount of stool burden particularly in his rectosigmoid colon.  He states that he normally does not use any type of stool softener cathartic at home.  He states that he routinely has 2-3 bowel movements per week.  However, his last bowel movement was over 1 week prior to this admission.  He states that in this past week, he has tried an over-the-counter enema on 3 occasions without effect.  He denies any fevers, chills, nausea, vomiting, diarrhea, hematochezia, melena.  He states that he has been clean and denies any illicit drug use at this time.  He continues to smoke 1 pack/day.  He has not seen his primary care physician for over 1 year.  He has not sought any medical care regarding his constipation in the past.  Because of increased abdominal distention, the patient presented for further evaluation.  CT of the abdomen and pelvis showed massive distention of the rectosigmoid colon with stool.  There is also severe distention of the remainder of the colon with stool with mild mass-effect on the majority of the intra-abdominal organs.  There was also mildly prominent bilateral inguinal lymph nodes.  Assessment/Plan: Obstipation -Patient currently refuses an enema at this time even after discussion of the benefits, risks, and alternatives -Concerned that the patient may need colectomy -General surgery consult -GI consult  Hypokalemia -Replete -Check  magnesium--2.1  Major depressive disorder, recurrent severe -Patient follows psychiatry -Continue BuSpar, Celexa, Depakote, Seroquel, trazodone, gabapentin  Tobacco abuse -I have discussed tobacco cessation with the patient.  I have counseled the patient regarding the negative impacts of continued tobacco use including but not limited to lung cancer, COPD, and cardiovascular disease.  I have discussed alternatives to tobacco and modalities that may help facilitate tobacco cessation including but not limited to biofeedback, hypnosis, and medications.  Total time spent with tobacco counseling was 4 minutes.  Traumatic brain injury history -Patient follows neuropsychology and psychiatry  History of polysubstance abuse/Cannibis use -Patient states that he has been sober for over 1 year -still uses THC  Nonischemic cardiomyopathy -Patient had repeat echocardiogram June 2015 at [facility] which showed improvement of his EF  Lower extremity edema -venous duplex   Disposition Plan:   Not stable for d/c--anticipate at least 2-3 days  Family Communication:  No Family at bedside  Consultants:  GI, general surgery  Code Status:  FULL  DVT Prophylaxis:  [place] Heparin    Procedures: As Listed in Progress Note Above  Antibiotics: None    Subjective: Patient is passing flatus but no bowel movement since admission.  He states that his abdominal pain is minimal.  He denies any nausea, vomiting, diarrhea, hematochezia, melena.  Denies any fever, chills, chest pain, shortness breath, palpitations, dizziness.  Objective: Vitals:   09/16/17 0000 09/16/17 0100 09/16/17 0200 09/16/17 0300  BP: 125/83 126/82 119/79 123/83  Pulse: (!) 120 (!) 102 (!) 103 (!) 103  Resp: 20     Temp:      TempSrc:  SpO2: 97% 96% 95% 95%  Weight:      Height:        Intake/Output Summary (Last 24 hours) at 09/16/2017 1610 Last data filed at 09/16/2017 0300 Gross per 24 hour  Intake 1022.91 ml  Output -    Net 1022.91 ml   Weight change:  Exam:   General:  Pt is alert, follows commands appropriately, not in acute distress  HEENT: No icterus, No thrush, No neck mass, [place]/AT  Cardiovascular: RRR, S1/S2, no rubs, no gallops  Respiratory: Bibasilar crackles but no wheezing  Abdomen: Soft/+BS, non tender, moderate distended, no guarding  Extremities: 2+LE edema, No lymphangitis, No petechiae, No rashes, no synovitis   Data Reviewed: I have personally reviewed following labs and imaging studies Basic Metabolic Panel: Recent Labs  Lab 09/15/17 1828  NA 139  K 3.2*  CL 105  CO2 27  GLUCOSE 115*  BUN 7  CREATININE 0.89  CALCIUM 9.2  MG 2.1  PHOS 3.3   Liver Function Tests: Recent Labs  Lab 09/15/17 1828  AST 14*  ALT 12  ALKPHOS 79  BILITOT 0.7  PROT 7.4  ALBUMIN 4.1   No results for input(s): LIPASE, AMYLASE in the last 168 hours. No results for input(s): AMMONIA in the last 168 hours. Coagulation Profile: No results for input(s): INR, PROTIME in the last 168 hours. CBC: Recent Labs  Lab 09/15/17 1828  WBC 6.9  NEUTROABS 4.5  HGB 14.7  HCT 43.8  MCV 85.0  PLT 146*   Cardiac Enzymes: No results for input(s): CKTOTAL, CKMB, CKMBINDEX, TROPONINI in the last 168 hours. BNP: Invalid input(s): POCBNP CBG: No results for input(s): GLUCAP in the last 168 hours. HbA1C: No results for input(s): HGBA1C in the last 72 hours. Urine analysis:    Component Value Date/Time   COLORURINE STRAW (A) 09/12/2016 0326   APPEARANCEUR CLEAR 09/12/2016 0326   LABSPEC 1.005 09/12/2016 0326   PHURINE 7.0 09/12/2016 0326   GLUCOSEU NEGATIVE 09/12/2016 0326   HGBUR NEGATIVE 09/12/2016 0326   BILIRUBINUR NEGATIVE 09/12/2016 0326   KETONESUR NEGATIVE 09/12/2016 0326   PROTEINUR NEGATIVE 09/12/2016 0326   UROBILINOGEN 0.2 08/27/2013 2203   NITRITE NEGATIVE 09/12/2016 0326   LEUKOCYTESUR NEGATIVE 09/12/2016 0326   Sepsis  Labs: @LABRCNTIP (procalcitonin:4,lacticidven:4) ) Recent Results (from the past 240 hour(s))  MRSA PCR Screening     Status: None   Collection Time: 09/15/17 10:44 PM  Result Value Ref Range Status   MRSA by PCR NEGATIVE NEGATIVE Final    Comment:        The GeneXpert MRSA Assay (FDA approved for NASAL specimens only), is one component of a comprehensive MRSA colonization surveillance program. It is not intended to diagnose MRSA infection nor to guide or monitor treatment for MRSA infections. Performed at [facility], [address] [place], [place] [address]      Scheduled Meds: . busPIRone  15 mg Oral TID  . citalopram  40 mg Oral Daily  . divalproex  500 mg Oral Q12H  . gabapentin  600 mg Oral TID  . heparin  5,000 Units Subcutaneous Q8H  . magnesium citrate  1 Bottle Oral Once  . QUEtiapine  800 mg Oral QHS  . traZODone  100 mg Oral QHS   Continuous Infusions: . 0.9 % NaCl with KCl 20 mEq / L 125 mL/hr at 09/15/17 2353    Procedures/Studies: Ct Abdomen Pelvis W Contrast  Result Date: 09/15/2017 CLINICAL DATA:  Abnormal bowel movements for 3 weeks. Abdominal  distension. Abdominal discomfort. EXAM: CT ABDOMEN AND PELVIS WITH CONTRAST TECHNIQUE: Multidetector CT imaging of the abdomen and pelvis was performed using the standard protocol following bolus administration of intravenous contrast. CONTRAST:  ISOVUE-300 IOPAMIDOL (ISOVUE-300) INJECTION 61% COMPARISON:  None. FINDINGS: Lower chest: No acute findings. Hepatobiliary: No focal liver abnormality is seen. No gallstones, gallbladder wall thickening, or biliary dilatation. Pancreas: Unremarkable. No pancreatic ductal dilatation or surrounding inflammatory changes. Spleen: Normal in size without focal abnormality. Adrenals/Urinary Tract: Adrenal glands are unremarkable. Kidneys are normal, without renal calculi, focal lesion, or hydronephrosis. Bladder is displaced anteriorly. Stomach/Bowel: Massive distention of the  rectal vault by stool. Additional severe distension of the remainder of the colon with stool. No dilated small bowel loops seen. Vascular/Lymphatic: No significant vascular findings are present. No enlarged abdominal or pelvic lymph nodes. Mildly prominent lymph nodes within the bilateral inguinal regions. Reproductive: No obvious abnormality. Other: No free fluid or abscess collection identified. No free intraperitoneal air. Musculoskeletal: No acute findings. Bilateral chronic pars interarticularis defects at the L5-S1 level with associated grade 1 spondylolisthesis. IMPRESSION: 1. Massive distension of the rectal vault and rectosigmoid colon by mostly stool and some gas, compatible with impaction and/or severe constipation. Additional fairly marked distension of the remainder of the colon, filled with stool throughout, again compatible with impaction and/or severe constipation. 2. No small bowel dilatation appreciated. 3. Majority of the intra-abdominal organs are displaced to some degree by mass effect from the distended colon. Bladder is displaced anteriorly by the massively distended rectosigmoid colon. Electronically Signed   By: Bary Richard M.D.   On: 09/15/2017 19:58    Catarina Hartshorn, DO  [facility] Pager [phone]  If 7PM-7AM, please contact night-coverage [email or web address] Password [email or web address] 09/16/2017, 6:37 AM   LOS: 0 days

## 2017-09-17 DIAGNOSIS — K5649 Other impaction of intestine: Secondary | ICD-10-CM | POA: Diagnosis not present

## 2017-09-17 DIAGNOSIS — K5900 Constipation, unspecified: Secondary | ICD-10-CM | POA: Diagnosis not present

## 2017-09-17 DIAGNOSIS — K5931 Toxic megacolon: Secondary | ICD-10-CM | POA: Diagnosis not present

## 2017-09-17 DIAGNOSIS — E876 Hypokalemia: Secondary | ICD-10-CM | POA: Diagnosis not present

## 2017-09-17 DIAGNOSIS — F1220 Cannabis dependence, uncomplicated: Secondary | ICD-10-CM | POA: Diagnosis not present

## 2017-09-17 DIAGNOSIS — K5641 Fecal impaction: Secondary | ICD-10-CM | POA: Diagnosis not present

## 2017-09-17 DIAGNOSIS — F332 Major depressive disorder, recurrent severe without psychotic features: Secondary | ICD-10-CM | POA: Diagnosis not present

## 2017-09-17 LAB — CBC
HEMATOCRIT: 37.7 % — AB (ref 39.0–52.0)
Hemoglobin: 12.5 g/dL — ABNORMAL LOW (ref 13.0–17.0)
MCH: 28.2 pg (ref 26.0–34.0)
MCHC: 33.2 g/dL (ref 30.0–36.0)
MCV: 84.9 fL (ref 78.0–100.0)
PLATELETS: 141 10*3/uL — AB (ref 150–400)
RBC: 4.44 MIL/uL (ref 4.22–5.81)
RDW: 14.9 % (ref 11.5–15.5)
WBC: 8 10*3/uL (ref 4.0–10.5)

## 2017-09-17 LAB — BASIC METABOLIC PANEL
ANION GAP: 7 (ref 5–15)
BUN: 7 mg/dL (ref 6–20)
CO2: 24 mmol/L (ref 22–32)
CREATININE: 0.76 mg/dL (ref 0.61–1.24)
Calcium: 9 mg/dL (ref 8.9–10.3)
Chloride: 108 mmol/L (ref 98–111)
GFR calc non Af Amer: >60 mL/min (ref >60)
Glucose, Bld: 112 mg/dL — ABNORMAL HIGH (ref 70–99)
POTASSIUM: 3.9 mmol/L (ref 3.5–5.1)
SODIUM: 139 mmol/L (ref 135–145)

## 2017-09-17 MED ORDER — MILK AND MOLASSES ENEMA
1.0000 | Freq: Once | RECTAL | Status: AC
  Administered 2017-09-17: 250 mL via RECTAL

## 2017-09-17 MED ORDER — KETOROLAC TROMETHAMINE 15 MG/ML IJ SOLN
15.0000 mg | Freq: Four times a day (QID) | INTRAMUSCULAR | Status: DC | PRN
  Administered 2017-09-17 – 2017-09-19 (×4): 15 mg via INTRAVENOUS
  Filled 2017-09-17 (×4): qty 1

## 2017-09-17 MED ORDER — POLYETHYLENE GLYCOL 3350 17 G PO PACK
17.0000 g | PACK | Freq: Three times a day (TID) | ORAL | Status: DC
  Administered 2017-09-17 – 2017-09-19 (×7): 17 g via ORAL
  Filled 2017-09-17 (×7): qty 1

## 2017-09-17 MED ORDER — LORAZEPAM 2 MG/ML IJ SOLN
1.0000 mg | Freq: Once | INTRAMUSCULAR | Status: AC
  Administered 2017-09-17: 1 mg via INTRAVENOUS
  Filled 2017-09-17: qty 1

## 2017-09-17 NOTE — Progress Notes (Signed)
Attempt additional Milk of Molasses enema today. Unfortunately gastrografin enema not available at [facility] until Tuesday. Will see if we can get results today with bowel regimen, if no we may have to consider gastrografin enema over the weekend at [facility].   Leanna Battles. Dixon Boos [facility] [phone] 8/31/201911:53 AM

## 2017-09-17 NOTE — Progress Notes (Signed)
PROGRESS NOTE  Philip Thompson ZOX:[id number] DOB: 1990-03-20 DOA: 09/15/2017 PCP: [name], MD  Brief History:  27 year old male with a history of chronic back pain, substance-induced mood disorder, polysubstance abuse including previous history of heroin, cocaine, THC, tobacco, alcohol, TBI, drug-induced cardiomyopathy presenting with 3-week history of increasing abdominal distention.  The patient has had chronic struggles with constipation.  He has had multiple imaging studies dating back to 07/23/2010 which has shown a large amount of stool burden particularly in his rectosigmoid colon.  He states that he normally does not use any type of stool softener cathartic at home.  He states that he routinely has 2-3 bowel movements per week.  However, his last bowel movement was over 1 week prior to this admission.  He states that in this past week, he has tried an over-the-counter enema on 3 occasions without effect.  He denies any fevers, chills, nausea, vomiting, diarrhea, hematochezia, melena.  He states that he has been clean and denies any illicit drug use at this time.  He continues to smoke 1 pack/day.  He has not seen his primary care physician for over 1 year.  He has not sought any medical care regarding his constipation in the past.  Because of increased abdominal distention, the patient presented for further evaluation.  CT of the abdomen and pelvis showed massive distention of the rectosigmoid colon with stool.  There is also severe distention of the remainder of the colon with stool with mild mass-effect on the majority of the intra-abdominal organs.  There was also mildly prominent bilateral inguinal lymph nodes.  Assessment/Plan: Obstipation -appreciate GI-->MOM enema with minimal results -keep in SDU if pt will need neostigmine -Concerned that the patient may need colectomy -General surgery consult--discussed with Dr. Elsie Amis medical therapy  first  Hypokalemia -Repleted -Check magnesium--2.1  Major depressive disorder, recurrent severe -Patient follows psychiatry -Continue BuSpar, Celexa, Depakote, Seroquel, trazodone, gabapentin  Tobacco abuse -I have discussed tobacco cessation with the patient.  I have counseled the patient regarding the negative impacts of continued tobacco use including but not limited to lung cancer, COPD, and cardiovascular disease.  I have discussed alternatives to tobacco and modalities that may help facilitate tobacco cessation including but not limited to biofeedback, hypnosis, and medications.  Total time spent with tobacco counseling was 4 minutes.  Traumatic brain injury history -Patient follows neuropsychology and psychiatry  History of polysubstance abuse/Cannibis use -Patient states that he has been sober for over 1 year -still uses THC  Nonischemic cardiomyopathy -Patient had repeat echocardiogram June 2015 at [facility] which showed improvement of his EF  Lower extremity edema -venous duplex--neg -distended abd likely impeding venous return   Disposition Plan:  remain in SDU in anticipation of possible neostigmine Family Communication:  No Family at bedside  Consultants:  GI, general surgery  Code Status:  FULL  DVT Prophylaxis:  [place] Heparin    Procedures: As Listed in Progress Note Above  Antibiotics: None   Subjective: Pt states he has only had liquid "leakage" after MOM enema.  No substantive BM.  Still feels full and distended in abd.  No n/v, cp, sob, f,c.  Wants to eat.  Objective: Vitals:   09/17/17 0400 09/17/17 0444 09/17/17 0504 09/17/17 0805  BP:  (!) 142/93 118/84   Pulse:   (!) 107   Resp:  19 (!) 24   Temp: 97.7 F (36.5 C)   (!) 97.5 F (36.4 C)  TempSrc: Oral   Oral  SpO2:   97%   Weight: 93.9 kg     Height:       No intake or output data in the 24 hours ending 09/17/17 0831 Weight change: 2.2 kg Exam:   General:  Pt is alert,  follows commands appropriately, not in acute distress  HEENT: No icterus, No thrush, No neck mass, [place]/AT  Cardiovascular: RRR, S1/S2, no rubs, no gallops  Respiratory: CTA bilaterally, no wheezing, no crackles, no rhonchi  Abdomen: Soft/+BS, non tender, moderate distended, no guarding  Extremities: 1 + LE edema, No lymphangitis, No petechiae, No rashes, no synovitis   Data Reviewed: I have personally reviewed following labs and imaging studies Basic Metabolic Panel: Recent Labs  Lab 09/15/17 1828 09/16/17 0735 09/17/17 0540  NA 139 140 139  K 3.2* 4.1 3.9  CL 105 109 108  CO2 27 24 24   GLUCOSE 115* 101* 112*  BUN 7 8 7   CREATININE 0.89 0.84 0.76  CALCIUM 9.2 9.0 9.0  MG 2.1  --   --   PHOS 3.3  --   --    Liver Function Tests: Recent Labs  Lab 09/15/17 1828  AST 14*  ALT 12  ALKPHOS 79  BILITOT 0.7  PROT 7.4  ALBUMIN 4.1   No results for input(s): LIPASE, AMYLASE in the last 168 hours. No results for input(s): AMMONIA in the last 168 hours. Coagulation Profile: No results for input(s): INR, PROTIME in the last 168 hours. CBC: Recent Labs  Lab 09/15/17 1828 09/17/17 0540  WBC 6.9 8.0  NEUTROABS 4.5  --   HGB 14.7 12.5*  HCT 43.8 37.7*  MCV 85.0 84.9  PLT 146* 141*   Cardiac Enzymes: No results for input(s): CKTOTAL, CKMB, CKMBINDEX, TROPONINI in the last 168 hours. BNP: Invalid input(s): POCBNP CBG: No results for input(s): GLUCAP in the last 168 hours. HbA1C: No results for input(s): HGBA1C in the last 72 hours. Urine analysis:    Component Value Date/Time   COLORURINE STRAW (A) 09/12/2016 0326   APPEARANCEUR CLEAR 09/12/2016 0326   LABSPEC 1.005 09/12/2016 0326   PHURINE 7.0 09/12/2016 0326   GLUCOSEU NEGATIVE 09/12/2016 0326   HGBUR NEGATIVE 09/12/2016 0326   BILIRUBINUR NEGATIVE 09/12/2016 0326   KETONESUR NEGATIVE 09/12/2016 0326   PROTEINUR NEGATIVE 09/12/2016 0326   UROBILINOGEN 0.2 08/27/2013 2203   NITRITE NEGATIVE 09/12/2016  0326   LEUKOCYTESUR NEGATIVE 09/12/2016 0326   Sepsis Labs: @LABRCNTIP (procalcitonin:4,lacticidven:4) ) Recent Results (from the past 240 hour(s))  MRSA PCR Screening     Status: None   Collection Time: 09/15/17 10:44 PM  Result Value Ref Range Status   MRSA by PCR NEGATIVE NEGATIVE Final    Comment:        The GeneXpert MRSA Assay (FDA approved for NASAL specimens only), is one component of a comprehensive MRSA colonization surveillance program. It is not intended to diagnose MRSA infection nor to guide or monitor treatment for MRSA infections. Performed at [facility], [address], [place], [place] [address]      Scheduled Meds: . busPIRone  15 mg Oral TID  . citalopram  40 mg Oral Daily  . divalproex  500 mg Oral Q12H  . gabapentin  600 mg Oral TID  . heparin  5,000 Units Subcutaneous Q8H  . magnesium citrate  1 Bottle Oral Once  . QUEtiapine  800 mg Oral QHS  . traZODone  100 mg Oral QHS   Continuous Infusions: . 0.9 % NaCl  with KCl 20 mEq / L 125 mL/hr at 09/17/17 6045    Procedures/Studies: Ct Abdomen Pelvis W Contrast  Result Date: 09/15/2017 CLINICAL DATA:  Abnormal bowel movements for 3 weeks. Abdominal distension. Abdominal discomfort. EXAM: CT ABDOMEN AND PELVIS WITH CONTRAST TECHNIQUE: Multidetector CT imaging of the abdomen and pelvis was performed using the standard protocol following bolus administration of intravenous contrast. CONTRAST:  ISOVUE-300 IOPAMIDOL (ISOVUE-300) INJECTION 61% COMPARISON:  None. FINDINGS: Lower chest: No acute findings. Hepatobiliary: No focal liver abnormality is seen. No gallstones, gallbladder wall thickening, or biliary dilatation. Pancreas: Unremarkable. No pancreatic ductal dilatation or surrounding inflammatory changes. Spleen: Normal in size without focal abnormality. Adrenals/Urinary Tract: Adrenal glands are unremarkable. Kidneys are normal, without renal calculi, focal lesion, or hydronephrosis. Bladder is  displaced anteriorly. Stomach/Bowel: Massive distention of the rectal vault by stool. Additional severe distension of the remainder of the colon with stool. No dilated small bowel loops seen. Vascular/Lymphatic: No significant vascular findings are present. No enlarged abdominal or pelvic lymph nodes. Mildly prominent lymph nodes within the bilateral inguinal regions. Reproductive: No obvious abnormality. Other: No free fluid or abscess collection identified. No free intraperitoneal air. Musculoskeletal: No acute findings. Bilateral chronic pars interarticularis defects at the L5-S1 level with associated grade 1 spondylolisthesis. IMPRESSION: 1. Massive distension of the rectal vault and rectosigmoid colon by mostly stool and some gas, compatible with impaction and/or severe constipation. Additional fairly marked distension of the remainder of the colon, filled with stool throughout, again compatible with impaction and/or severe constipation. 2. No small bowel dilatation appreciated. 3. Majority of the intra-abdominal organs are displaced to some degree by mass effect from the distended colon. Bladder is displaced anteriorly by the massively distended rectosigmoid colon. Electronically Signed   By: Bary Richard M.D.   On: 09/15/2017 19:58   US Venous Img Lower Bilateral  Result Date: 09/16/2017 CLINICAL DATA:  Bilateral lower extremity edema for 2 months EXAM: BILATERAL LOWER EXTREMITY VENOUS DUPLEX ULTRASOUND TECHNIQUE: Doppler venous assessment of the bilateral lower extremity deep venous system was performed, including characterization of spectral flow, compressibility, and phasicity. COMPARISON:  None. FINDINGS: There is complete compressibility of the bilateral common femoral, femoral, and popliteal veins. Doppler analysis demonstrates respiratory phasicity and augmentation of flow with calf compression. No obvious superficial vein or calf vein thrombosis. IMPRESSION: No evidence of lower extremity DVT.  Electronically Signed   By: Jolaine Click M.D.   On: 09/16/2017 12:45    Catarina Hartshorn, DO  [facility] Pager [phone]  If 7PM-7AM, please contact night-coverage [email or web address] Password [email or web address] 09/17/2017, 8:31 AM   LOS: 0 days

## 2017-09-17 NOTE — Progress Notes (Signed)
Subjective:  No change in abdominal distention or discomfort.  No change in rectal discomfort.  Allowed 2 attempts of enemas yesterday, at least one was MOM.  According to nursing staff, they were unsuccessful in getting milk of molasses above the impaction.  Liquid went in and came back out immediately.    Objective: Vital signs in last 24 hours: Temp:  [97.5 F (36.4 C)-98.6 F (37 C)] 97.5 F (36.4 C) (08/31 0805) Pulse Rate:  [101-119] 101 (08/31 0700) Resp:  [17-24] 21 (08/31 0700) BP: (118-143)/(84-94) 143/94 (08/31 0700) SpO2:  [95 %-97 %] 95 % (08/31 0700) Weight:  [93.9 kg] 93.9 kg (08/31 0400) Last BM Date: 09/16/17(Small;liquid) General:   Alert,  Well-developed, well-nourished, pleasant and cooperative in NAD.  Mother at bedside. Head:  Normocephalic and atraumatic. Eyes:  Sclera clear, no icterus.  Abdomen: Firm, significantly distended, positive bowel sounds, mildly under with palpation.  without guarding, and without rebound.   Extremities:  Without clubbing, deformity.  2+ edema bilateral lower extremities. Neurologic:  Alert and  oriented x4;  grossly normal neurologically. Skin:  Intact without significant lesions or rashes. Psych:  Alert and cooperative. Normal mood and affect.  Intake/Output from previous day: No intake/output data recorded. Intake/Output this shift: Total I/O In: 1431.3 [I.V.:1431.3] Out: -   Lab Results: CBC Recent Labs    09/15/17 1828 09/17/17 0540  WBC 6.9 8.0  HGB 14.7 12.5*  HCT 43.8 37.7*  MCV 85.0 84.9  PLT 146* 141*   BMET Recent Labs    09/15/17 1828 09/16/17 0735 09/17/17 0540  NA 139 140 139  K 3.2* 4.1 3.9  CL 105 109 108  CO2 27 24 24   GLUCOSE 115* 101* 112*  BUN 7 8 7   CREATININE 0.89 0.84 0.76  CALCIUM 9.2 9.0 9.0   LFTs Recent Labs    09/15/17 1828  BILITOT 0.7  ALKPHOS 79  AST 14*  ALT 12  PROT 7.4  ALBUMIN 4.1   No results for input(s): LIPASE in the last 72 hours. PT/INR No results for  input(s): LABPROT, INR in the last 72 hours.    Imaging Studies: Ct Abdomen Pelvis W Contrast  Result Date: 09/15/2017 CLINICAL DATA:  Abnormal bowel movements for 3 weeks. Abdominal distension. Abdominal discomfort. EXAM: CT ABDOMEN AND PELVIS WITH CONTRAST TECHNIQUE: Multidetector CT imaging of the abdomen and pelvis was performed using the standard protocol following bolus administration of intravenous contrast. CONTRAST:  ISOVUE-300 IOPAMIDOL (ISOVUE-300) INJECTION 61% COMPARISON:  None. FINDINGS: Lower chest: No acute findings. Hepatobiliary: No focal liver abnormality is seen. No gallstones, gallbladder wall thickening, or biliary dilatation. Pancreas: Unremarkable. No pancreatic ductal dilatation or surrounding inflammatory changes. Spleen: Normal in size without focal abnormality. Adrenals/Urinary Tract: Adrenal glands are unremarkable. Kidneys are normal, without renal calculi, focal lesion, or hydronephrosis. Bladder is displaced anteriorly. Stomach/Bowel: Massive distention of the rectal vault by stool. Additional severe distension of the remainder of the colon with stool. No dilated small bowel loops seen. Vascular/Lymphatic: No significant vascular findings are present. No enlarged abdominal or pelvic lymph nodes. Mildly prominent lymph nodes within the bilateral inguinal regions. Reproductive: No obvious abnormality. Other: No free fluid or abscess collection identified. No free intraperitoneal air. Musculoskeletal: No acute findings. Bilateral chronic pars interarticularis defects at the L5-S1 level with associated grade 1 spondylolisthesis. IMPRESSION: 1. Massive distension of the rectal vault and rectosigmoid colon by mostly stool and some gas, compatible with impaction and/or severe constipation. Additional fairly marked distension of the remainder of  the colon, filled with stool throughout, again compatible with impaction and/or severe constipation. 2. No small bowel dilatation  appreciated. 3. Majority of the intra-abdominal organs are displaced to some degree by mass effect from the distended colon. Bladder is displaced anteriorly by the massively distended rectosigmoid colon. Electronically Signed   By: Bary Richard M.D.   On: 09/15/2017 19:58   US Venous Img Lower Bilateral  Result Date: 09/16/2017 CLINICAL DATA:  Bilateral lower extremity edema for 2 months EXAM: BILATERAL LOWER EXTREMITY VENOUS DUPLEX ULTRASOUND TECHNIQUE: Doppler venous assessment of the bilateral lower extremity deep venous system was performed, including characterization of spectral flow, compressibility, and phasicity. COMPARISON:  None. FINDINGS: There is complete compressibility of the bilateral common femoral, femoral, and popliteal veins. Doppler analysis demonstrates respiratory phasicity and augmentation of flow with calf compression. No obvious superficial vein or calf vein thrombosis. IMPRESSION: No evidence of lower extremity DVT. Electronically Signed   By: Jolaine Click M.D.   On: 09/16/2017 12:45  [2 weeks]   Assessment:  27 year old male with history of polysubstance abuse and underlying chronic constipation presenting with history of no BM in 3 weeks, significant abdominal distention and abdominal pain.  CT showing massive distention of the rectal vault and rectosigmoid colon as well as marked distention of the remainder of the colon filled with stool compatible with impaction and/or severe constipation.  Attempted milk of molasses enema yesterday around 1 PM unsuccessful as outlined above.  Magnesium citrate refused by patient yesterday.  Plan: 1. Discussed with Dr. Jena Gauss. Would be helpful to try gastrografin enema for therapeutic/diagnostic purposes if we can, likely not available at [facility] on weekend/Labor Day. May have to go to [facility] for procedure.  2. Add Miralax 17grams TID to soft stool. 3. Consider additional attempts at milk of molasses enema today depending on if gastrografin  enema available today or not.  4. No plans for flex sig for decompression purposes at this time, not felt to be very helpful.  5. Continue to monitor closely for decline or signs of perforation.   Leanna Battles. Dixon Boos [facility] [phone] 8/31/201911:22 AM     LOS: 0 days

## 2017-09-18 ENCOUNTER — Inpatient Hospital Stay (HOSPITAL_COMMUNITY): Payer: BLUE CROSS/BLUE SHIELD

## 2017-09-18 ENCOUNTER — Ambulatory Visit (HOSPITAL_COMMUNITY)
Admission: EM | Admit: 2017-09-18 | Payer: BLUE CROSS/BLUE SHIELD | Source: Ambulatory Visit | Admitting: Gastroenterology

## 2017-09-18 ENCOUNTER — Observation Stay (HOSPITAL_COMMUNITY): Payer: BLUE CROSS/BLUE SHIELD

## 2017-09-18 DIAGNOSIS — K5641 Fecal impaction: Secondary | ICD-10-CM | POA: Diagnosis not present

## 2017-09-18 DIAGNOSIS — F17210 Nicotine dependence, cigarettes, uncomplicated: Secondary | ICD-10-CM | POA: Diagnosis present

## 2017-09-18 DIAGNOSIS — K5931 Toxic megacolon: Secondary | ICD-10-CM | POA: Diagnosis not present

## 2017-09-18 DIAGNOSIS — Z8249 Family history of ischemic heart disease and other diseases of the circulatory system: Secondary | ICD-10-CM | POA: Diagnosis not present

## 2017-09-18 DIAGNOSIS — R042 Hemoptysis: Secondary | ICD-10-CM | POA: Diagnosis not present

## 2017-09-18 DIAGNOSIS — G8929 Other chronic pain: Secondary | ICD-10-CM | POA: Diagnosis present

## 2017-09-18 DIAGNOSIS — F419 Anxiety disorder, unspecified: Secondary | ICD-10-CM | POA: Diagnosis present

## 2017-09-18 DIAGNOSIS — K5909 Other constipation: Secondary | ICD-10-CM | POA: Diagnosis present

## 2017-09-18 DIAGNOSIS — K5900 Constipation, unspecified: Secondary | ICD-10-CM | POA: Diagnosis not present

## 2017-09-18 DIAGNOSIS — K5939 Other megacolon: Secondary | ICD-10-CM | POA: Diagnosis present

## 2017-09-18 DIAGNOSIS — M1990 Unspecified osteoarthritis, unspecified site: Secondary | ICD-10-CM | POA: Diagnosis present

## 2017-09-18 DIAGNOSIS — Z79899 Other long term (current) drug therapy: Secondary | ICD-10-CM | POA: Diagnosis not present

## 2017-09-18 DIAGNOSIS — Z87820 Personal history of traumatic brain injury: Secondary | ICD-10-CM | POA: Diagnosis not present

## 2017-09-18 DIAGNOSIS — F332 Major depressive disorder, recurrent severe without psychotic features: Secondary | ICD-10-CM | POA: Diagnosis present

## 2017-09-18 DIAGNOSIS — I428 Other cardiomyopathies: Secondary | ICD-10-CM | POA: Diagnosis present

## 2017-09-18 DIAGNOSIS — Z833 Family history of diabetes mellitus: Secondary | ICD-10-CM | POA: Diagnosis not present

## 2017-09-18 DIAGNOSIS — E039 Hypothyroidism, unspecified: Secondary | ICD-10-CM | POA: Diagnosis present

## 2017-09-18 DIAGNOSIS — Z716 Tobacco abuse counseling: Secondary | ICD-10-CM | POA: Diagnosis not present

## 2017-09-18 DIAGNOSIS — E876 Hypokalemia: Secondary | ICD-10-CM | POA: Diagnosis present

## 2017-09-18 DIAGNOSIS — Z791 Long term (current) use of non-steroidal anti-inflammatories (NSAID): Secondary | ICD-10-CM | POA: Diagnosis not present

## 2017-09-18 DIAGNOSIS — K5649 Other impaction of intestine: Secondary | ICD-10-CM | POA: Diagnosis not present

## 2017-09-18 DIAGNOSIS — J9811 Atelectasis: Secondary | ICD-10-CM | POA: Diagnosis not present

## 2017-09-18 DIAGNOSIS — F1120 Opioid dependence, uncomplicated: Secondary | ICD-10-CM | POA: Diagnosis present

## 2017-09-18 DIAGNOSIS — R609 Edema, unspecified: Secondary | ICD-10-CM | POA: Diagnosis present

## 2017-09-18 DIAGNOSIS — Z811 Family history of alcohol abuse and dependence: Secondary | ICD-10-CM | POA: Diagnosis not present

## 2017-09-18 DIAGNOSIS — M549 Dorsalgia, unspecified: Secondary | ICD-10-CM | POA: Diagnosis present

## 2017-09-18 DIAGNOSIS — F988 Other specified behavioral and emotional disorders with onset usually occurring in childhood and adolescence: Secondary | ICD-10-CM | POA: Diagnosis present

## 2017-09-18 DIAGNOSIS — D696 Thrombocytopenia, unspecified: Secondary | ICD-10-CM | POA: Diagnosis present

## 2017-09-18 DIAGNOSIS — F1220 Cannabis dependence, uncomplicated: Secondary | ICD-10-CM | POA: Diagnosis not present

## 2017-09-18 LAB — PROTIME-INR
INR: 1.09
Prothrombin Time: 14 seconds (ref 11.4–15.2)

## 2017-09-18 LAB — APTT: aPTT: 33 seconds (ref 24–36)

## 2017-09-18 LAB — BASIC METABOLIC PANEL
Anion gap: 5 (ref 5–15)
BUN: 8 mg/dL (ref 6–20)
CALCIUM: 8.9 mg/dL (ref 8.9–10.3)
CHLORIDE: 111 mmol/L (ref 98–111)
CO2: 23 mmol/L (ref 22–32)
Creatinine, Ser: 0.79 mg/dL (ref 0.61–1.24)
Glucose, Bld: 96 mg/dL (ref 70–99)
Potassium: 4.1 mmol/L (ref 3.5–5.1)
SODIUM: 139 mmol/L (ref 135–145)

## 2017-09-18 LAB — CBC
HCT: 37 % — ABNORMAL LOW (ref 39.0–52.0)
HEMOGLOBIN: 12 g/dL — AB (ref 13.0–17.0)
MCH: 27.5 pg (ref 26.0–34.0)
MCHC: 32.4 g/dL (ref 30.0–36.0)
MCV: 84.9 fL (ref 78.0–100.0)
Platelets: 129 10*3/uL — ABNORMAL LOW (ref 150–400)
RBC: 4.36 MIL/uL (ref 4.22–5.81)
RDW: 14.8 % (ref 11.5–15.5)
WBC: 7.6 10*3/uL (ref 4.0–10.5)

## 2017-09-18 LAB — FOLATE: FOLATE: 6.5 ng/mL (ref >5.9)

## 2017-09-18 LAB — TSH: TSH: 6.624 u[IU]/mL — AB (ref 0.350–4.500)

## 2017-09-18 LAB — MAGNESIUM: MAGNESIUM: 1.8 mg/dL (ref 1.7–2.4)

## 2017-09-18 LAB — VITAMIN B12: VITAMIN B 12: 331 pg/mL (ref 180–914)

## 2017-09-18 LAB — T4, FREE: Free T4: 0.62 ng/dL — ABNORMAL LOW (ref 0.82–1.77)

## 2017-09-18 MED ORDER — LORAZEPAM 2 MG/ML IJ SOLN
1.0000 mg | Freq: Once | INTRAMUSCULAR | Status: AC
  Administered 2017-09-18: 1 mg via INTRAVENOUS
  Filled 2017-09-18: qty 1

## 2017-09-18 MED ORDER — LIDOCAINE 5 % EX OINT
TOPICAL_OINTMENT | Freq: Once | CUTANEOUS | Status: AC
  Administered 2017-09-18: 12:00:00 via TOPICAL
  Filled 2017-09-18: qty 35.44

## 2017-09-18 MED ORDER — MAGNESIUM SULFATE 2 GM/50ML IV SOLN
2.0000 g | Freq: Once | INTRAVENOUS | Status: AC
  Administered 2017-09-18: 2 g via INTRAVENOUS
  Filled 2017-09-18: qty 50

## 2017-09-18 MED ORDER — DIATRIZOATE MEGLUMINE & SODIUM 66-10 % PO SOLN
ORAL | Status: AC
  Filled 2017-09-18: qty 240

## 2017-09-18 NOTE — Progress Notes (Signed)
PROGRESS NOTE  Philip Thompson ZYS:[id number] DOB: [birth year] DOA: 09/15/2017 PCP: [name], MD  Brief History: 27 year old male with a history of chronic back pain, substance-induced mood disorder, polysubstance abuse including previous history of heroin, cocaine, THC, tobacco, alcohol, TBI, drug-induced cardiomyopathy presenting with 3-week history of increasing abdominal distention. The patient has had chronic struggles with constipation. He has had multiple imaging studies dating back to 07/23/2010 which has shown a large amount of stool burden particularly in his rectosigmoid colon. He states that he normally does not use any type of stool softener cathartic at home. He states that he routinely has 2-3 bowel movements per week. However,his last bowel movement was over 1 week prior to this admission. He states that in this past week, he has tried an over-the-counter enema on 3 occasions without effect. He denies any fevers, chills, nausea, vomiting, diarrhea, hematochezia, melena. He states that he has been clean and denies any illicit drug use at this time. He continues to smoke 1 pack/day. He has not seen his primary care physician for over 1 year. He has not sought any medical care regarding his constipation in the past. Because of increased abdominal distention, the patient presented for further evaluation. CT of the abdomen and pelvis showed massive distention of the rectosigmoid colon with stool. There is also severe distention of the remainder of the colon with stool with mild mass-effect on the majority of the intra-abdominal organs. There was also mildly prominent bilateral inguinal lymph nodes.  Assessment/Plan: Obstipation -appreciate GI-->MOM enema x 2 with minimal results -planning gastrograffin enema -keep in SDU if pt will need neostigmine -Concerned that the patient may need colectomy -General surgery consult--discussed with Dr. Elsie Amis  medical therapy first  Hemoptysis -CXR -check coags -D-dimer  Hypokalemia -Repleted -Check magnesium--1.8  Major depressive disorder, recurrent severe -Patient follows psychiatry -Continue BuSpar, Celexa, Depakote, Seroquel, trazodone, gabapentin  Tobacco abuse -I have discussed tobacco cessation with the patient. I have counseled the patient regarding the negative impacts of continued tobacco use including but not limited to lung cancer, COPD, and cardiovascular disease. I have discussed alternatives to tobacco and modalities that may help facilitate tobacco cessation including but not limited to biofeedback, hypnosis, and medications. Total time spent with tobacco counseling was 4 minutes.  Traumatic brain injury history -Patient follows neuropsychology and psychiatry  History of polysubstance abuse/Cannibis use -Patient states that he has been sober for over 1 year -still uses THC  Nonischemic cardiomyopathy -Patient had repeat echocardiogram June 2015 at [facility] which showed improvement of his EF  Lower extremity edema -venous duplex--neg -distended abd likely impeding venous return  Thrombocytopenia -HIV -serum B12 -folate -hep B surface antigen -hep C antibody   Disposition Plan:remain in SDU in anticipation of possible neostigmine Family Communication:mother updated at bedside  Consultants:GI, general surgery  Code Status: FULL  DVT Prophylaxis: SCDs   Procedures: As Listed in Progress Note Above  Antibiotics: None   Subjective:   Objective: Vitals:   09/18/17 0500 09/18/17 0600 09/18/17 0700 09/18/17 0744  BP:      Pulse:      Resp: 14 18 13 14   Temp:    98 F (36.7 C)  TempSrc:    Oral  SpO2:      Weight:      Height:        Intake/Output Summary (Last 24 hours) at 09/18/2017 0905 Last data filed at 09/18/2017 0600 Gross per 24 hour  Intake 2625 ml  Output -  Net 2625 ml   Weight change:   Exam:   General:  Pt is alert, follows commands appropriately, not in acute distress  HEENT: No icterus, No thrush, No neck mass, [place]re/AT  Cardiovascular: RRR, S1/S2, no rubs, no gallops  Respiratory: CTA bilaterally, no wheezing, no crackles, no rhonchi  Abdomen: Soft/+BS, non tender, non distended, no guarding  Extremities: No edema, No lymphangitis, No petechiae, No rashes, no synovitis   Data Reviewed: I have personally reviewed following labs and imaging studies Basic Metabolic Panel: Recent Labs  Lab 09/15/17 1828 09/16/17 0735 09/17/17 0540 09/18/17 0439  NA 139 140 139 139  K 3.2* 4.1 3.9 4.1  CL 105 109 108 111  CO2 27 24 24 23   GLUCOSE 115* 101* 112* 96  BUN 7 8 7 8   CREATININE 0.89 0.84 0.76 0.79  CALCIUM 9.2 9.0 9.0 8.9  MG 2.1  --   --  1.8  PHOS 3.3  --   --   --    Liver Function Tests: Recent Labs  Lab 09/15/17 1828  AST 14*  ALT 12  ALKPHOS 79  BILITOT 0.7  PROT 7.4  ALBUMIN 4.1   No results for input(s): LIPASE, AMYLASE in the last 168 hours. No results for input(s): AMMONIA in the last 168 hours. Coagulation Profile: No results for input(s): INR, PROTIME in the last 168 hours. CBC: Recent Labs  Lab 09/15/17 1828 09/17/17 0540 09/18/17 0439  WBC 6.9 8.0 7.6  NEUTROABS 4.5  --   --   HGB 14.7 12.5* 12.0*  HCT 43.8 37.7* 37.0*  MCV 85.0 84.9 84.9  PLT 146* 141* 129*   Cardiac Enzymes: No results for input(s): CKTOTAL, CKMB, CKMBINDEX, TROPONINI in the last 168 hours. BNP: Invalid input(s): POCBNP CBG: No results for input(s): GLUCAP in the last 168 hours. HbA1C: No results for input(s): HGBA1C in the last 72 hours. Urine analysis:    Component Value Date/Time   COLORURINE STRAW (A) 09/12/2016 0326   APPEARANCEUR CLEAR 09/12/2016 0326   LABSPEC 1.005 09/12/2016 0326   PHURINE 7.0 09/12/2016 0326   GLUCOSEU NEGATIVE 09/12/2016 0326   HGBUR NEGATIVE 09/12/2016 0326   BILIRUBINUR NEGATIVE 09/12/2016 0326   KETONESUR  NEGATIVE 09/12/2016 0326   PROTEINUR NEGATIVE 09/12/2016 0326   UROBILINOGEN 0.2 08/27/2013 2203   NITRITE NEGATIVE 09/12/2016 0326   LEUKOCYTESUR NEGATIVE 09/12/2016 0326   Sepsis Labs: @LABRCNTIP (procalcitonin:4,lacticidven:4) ) Recent Results (from the past 240 hour(s))  MRSA PCR Screening     Status: None   Collection Time: 09/15/17 10:44 PM  Result Value Ref Range Status   MRSA by PCR NEGATIVE NEGATIVE Final    Comment:        The GeneXpert MRSA Assay (FDA approved for NASAL specimens only), is one component of a comprehensive MRSA colonization surveillance program. It is not intended to diagnose MRSA infection nor to guide or monitor treatment for MRSA infections. Performed at [facility], [address], [place], [place] [address]      Scheduled Meds: . busPIRone  15 mg Oral TID  . citalopram  40 mg Oral Daily  . divalproex  500 mg Oral Q12H  . gabapentin  600 mg Oral TID  . heparin  5,000 Units Subcutaneous Q8H  . magnesium citrate  1 Bottle Oral Once  . polyethylene glycol  17 g Oral TID  . QUEtiapine  800 mg Oral QHS  . traZODone  100 mg Oral QHS   Continuous Infusions: .  0.9 % NaCl with KCl 20 mEq / L 125 mL/hr at 09/17/17 2225    Procedures/Studies: Ct Abdomen Pelvis W Contrast  Result Date: 09/15/2017 CLINICAL DATA:  Abnormal bowel movements for 3 weeks. Abdominal distension. Abdominal discomfort. EXAM: CT ABDOMEN AND PELVIS WITH CONTRAST TECHNIQUE: Multidetector CT imaging of the abdomen and pelvis was performed using the standard protocol following bolus administration of intravenous contrast. CONTRAST:  ISOVUE-300 IOPAMIDOL (ISOVUE-300) INJECTION 61% COMPARISON:  None. FINDINGS: Lower chest: No acute findings. Hepatobiliary: No focal liver abnormality is seen. No gallstones, gallbladder wall thickening, or biliary dilatation. Pancreas: Unremarkable. No pancreatic ductal dilatation or surrounding inflammatory changes. Spleen: Normal in size without  focal abnormality. Adrenals/Urinary Tract: Adrenal glands are unremarkable. Kidneys are normal, without renal calculi, focal lesion, or hydronephrosis. Bladder is displaced anteriorly. Stomach/Bowel: Massive distention of the rectal vault by stool. Additional severe distension of the remainder of the colon with stool. No dilated small bowel loops seen. Vascular/Lymphatic: No significant vascular findings are present. No enlarged abdominal or pelvic lymph nodes. Mildly prominent lymph nodes within the bilateral inguinal regions. Reproductive: No obvious abnormality. Other: No free fluid or abscess collection identified. No free intraperitoneal air. Musculoskeletal: No acute findings. Bilateral chronic pars interarticularis defects at the L5-S1 level with associated grade 1 spondylolisthesis. IMPRESSION: 1. Massive distension of the rectal vault and rectosigmoid colon by mostly stool and some gas, compatible with impaction and/or severe constipation. Additional fairly marked distension of the remainder of the colon, filled with stool throughout, again compatible with impaction and/or severe constipation. 2. No small bowel dilatation appreciated. 3. Majority of the intra-abdominal organs are displaced to some degree by mass effect from the distended colon. Bladder is displaced anteriorly by the massively distended rectosigmoid colon. Electronically Signed   By: Bary Richard M.D.   On: 09/15/2017 19:58   US Venous Img Lower Bilateral  Result Date: 09/16/2017 CLINICAL DATA:  Bilateral lower extremity edema for 2 months EXAM: BILATERAL LOWER EXTREMITY VENOUS DUPLEX ULTRASOUND TECHNIQUE: Doppler venous assessment of the bilateral lower extremity deep venous system was performed, including characterization of spectral flow, compressibility, and phasicity. COMPARISON:  None. FINDINGS: There is complete compressibility of the bilateral common femoral, femoral, and popliteal veins. Doppler analysis demonstrates  respiratory phasicity and augmentation of flow with calf compression. No obvious superficial vein or calf vein thrombosis. IMPRESSION: No evidence of lower extremity DVT. Electronically Signed   By: Jolaine Click M.D.   On: 09/16/2017 12:45    Catarina Hartshorn, DO  [facility] Pager [phone]  If 7PM-7AM, please contact night-coverage [email or web address] Password [email or web address] 09/18/2017, 9:05 AM   LOS: 0 days

## 2017-09-18 NOTE — Progress Notes (Signed)
Subjective:  No success with enemas yesterday.  Passing some liquid from the rectum but no solid stool.  No nausea/vomiting.  Complains of abdominal distention/discomfort/rectal discomfort which is unchanged.    Objective: Vital signs in last 24 hours: Temp:  [97.4 F (36.3 C)-98.4 F (36.9 C)] 98 F (36.7 C) (09/01 0744) Pulse Rate:  [97] 97 (08/31 1115) Resp:  [13-23] 14 (09/01 0744) BP: (118-135)/(75-93) 118/78 (09/01 0000) SpO2:  [98 %-99 %] 99 % (09/01 0000) Last BM Date: 09/17/17(liquid) General:   Alert,  Well-developed, well-nourished, pleasant and cooperative in NAD Head:  Normocephalic and atraumatic. Eyes:  Sclera clear, no icterus.  Abdomen: Firm, significantly distended, mildly tender with palpation. Normal bowel sounds, without guarding, and without rebound.   Rectal: liquid brown stool leaking out, internal rectal exam, large amount of soft stool barely in reach of gloved finger, minimal amount of stool was disimpacted. Small amount of blood in finger.  Extremities:  Without clubbing, deformity.  Plus edema bilateral lower extremities.  Neurologic:  Alert and  oriented x4;  grossly normal neurologically. Skin:  Intact without significant lesions or rashes. Psych:  Alert and cooperative. Normal mood and affect.  Intake/Output from previous day: 08/31 0701 - 09/01 0700 In: 4056.3 [I.V.:4056.3] Out: -  Intake/Output this shift: No intake/output data recorded.  Lab Results: CBC Recent Labs    09/15/17 1828 09/17/17 0540 09/18/17 0439  WBC 6.9 8.0 7.6  HGB 14.7 12.5* 12.0*  HCT 43.8 37.7* 37.0*  MCV 85.0 84.9 84.9  PLT 146* 141* 129*   BMET Recent Labs    09/16/17 0735 09/17/17 0540 09/18/17 0439  NA 140 139 139  K 4.1 3.9 4.1  CL 109 108 111  CO2 24 24 23   GLUCOSE 101* 112* 96  BUN 8 7 8   CREATININE 0.84 0.76 0.79  CALCIUM 9.0 9.0 8.9   LFTs Recent Labs    09/15/17 1828  BILITOT 0.7  ALKPHOS 79  AST 14*  ALT 12  PROT 7.4  ALBUMIN 4.1    No results for input(s): LIPASE in the last 72 hours. PT/INR No results for input(s): LABPROT, INR in the last 72 hours.    Imaging Studies: Ct Abdomen Pelvis W Contrast  Result Date: 09/15/2017 CLINICAL DATA:  Abnormal bowel movements for 3 weeks. Abdominal distension. Abdominal discomfort. EXAM: CT ABDOMEN AND PELVIS WITH CONTRAST TECHNIQUE: Multidetector CT imaging of the abdomen and pelvis was performed using the standard protocol following bolus administration of intravenous contrast. CONTRAST:  ISOVUE-300 IOPAMIDOL (ISOVUE-300) INJECTION 61% COMPARISON:  None. FINDINGS: Lower chest: No acute findings. Hepatobiliary: No focal liver abnormality is seen. No gallstones, gallbladder wall thickening, or biliary dilatation. Pancreas: Unremarkable. No pancreatic ductal dilatation or surrounding inflammatory changes. Spleen: Normal in size without focal abnormality. Adrenals/Urinary Tract: Adrenal glands are unremarkable. Kidneys are normal, without renal calculi, focal lesion, or hydronephrosis. Bladder is displaced anteriorly. Stomach/Bowel: Massive distention of the rectal vault by stool. Additional severe distension of the remainder of the colon with stool. No dilated small bowel loops seen. Vascular/Lymphatic: No significant vascular findings are present. No enlarged abdominal or pelvic lymph nodes. Mildly prominent lymph nodes within the bilateral inguinal regions. Reproductive: No obvious abnormality. Other: No free fluid or abscess collection identified. No free intraperitoneal air. Musculoskeletal: No acute findings. Bilateral chronic pars interarticularis defects at the L5-S1 level with associated grade 1 spondylolisthesis. IMPRESSION: 1. Massive distension of the rectal vault and rectosigmoid colon by mostly stool and some gas, compatible with impaction and/or severe  constipation. Additional fairly marked distension of the remainder of the colon, filled with stool throughout, again  compatible with impaction and/or severe constipation. 2. No small bowel dilatation appreciated. 3. Majority of the intra-abdominal organs are displaced to some degree by mass effect from the distended colon. Bladder is displaced anteriorly by the massively distended rectosigmoid colon. Electronically Signed   By: Bary Richard M.D.   On: 09/15/2017 19:58   US Venous Img Lower Bilateral  Result Date: 09/16/2017 CLINICAL DATA:  Bilateral lower extremity edema for 2 months EXAM: BILATERAL LOWER EXTREMITY VENOUS DUPLEX ULTRASOUND TECHNIQUE: Doppler venous assessment of the bilateral lower extremity deep venous system was performed, including characterization of spectral flow, compressibility, and phasicity. COMPARISON:  None. FINDINGS: There is complete compressibility of the bilateral common femoral, femoral, and popliteal veins. Doppler analysis demonstrates respiratory phasicity and augmentation of flow with calf compression. No obvious superficial vein or calf vein thrombosis. IMPRESSION: No evidence of lower extremity DVT. Electronically Signed   By: Jolaine Click M.D.   On: 09/16/2017 12:45  [2 weeks]   Assessment:  27 year old male with history of polysubstance abuse and underlying chronic constipation presented with history of no bowel movement for 3 weeks, significant abdominal distention and abdominal pain.  CT showing massive distention of the rectal vault and rectosigmoid colon as well as marked distention of the remainder of the colon filled with stool compatible with impaction and/or severe constipation.  Milk of molasses enema x2, soapsuds enema over the course of last 48 hours have been unsuccessful.  Patient on MiraLAX 17 g 3 times daily. Unsuccessful manual disimpaction.   Discussed with Dr. Jena Gauss who has recommended therapeutic/diagnostic Gastrografin enema which unfortunately is not available at her facility over the week/Labor Day. I spoke with Dr. Charise Killian down at [facility] who has kindly  agreed to perform procedure today and requested patient be brought down as soon as possible. He is available until 6pm today. Discussed with Dr. Arbutus Leas as well.   Plan: 1. Gastrografin enema today at [facility]. Will return after procedure.   Leanna Battles. Dixon Boos [facility] [phone] 9/1/201910:48 AM     LOS: 0 days

## 2017-09-18 NOTE — Progress Notes (Signed)
Gastrografin enema completed 1500 today. Preliminary KUB demonstrated massive colorectal distention with stool similar to recent CT. Technically successful therapeutic gastrografin enema.  Contrast only reached the proximal descending colon. Significant residual stool post evacuation.   Will continue to monitor overnight.   Leanna Battles. Dixon Boos [facility] [phone] 9/1/20196:00 PM

## 2017-09-19 DIAGNOSIS — K598 Other specified functional intestinal disorders: Secondary | ICD-10-CM

## 2017-09-19 DIAGNOSIS — K5981 Ogilvie syndrome: Secondary | ICD-10-CM

## 2017-09-19 DIAGNOSIS — K5909 Other constipation: Secondary | ICD-10-CM

## 2017-09-19 LAB — BASIC METABOLIC PANEL
Anion gap: 6 (ref 5–15)
BUN: 9 mg/dL (ref 6–20)
CALCIUM: 9.2 mg/dL (ref 8.9–10.3)
CHLORIDE: 111 mmol/L (ref 98–111)
CO2: 25 mmol/L (ref 22–32)
Creatinine, Ser: 0.71 mg/dL (ref 0.61–1.24)
Glucose, Bld: 95 mg/dL (ref 70–99)
Potassium: 4.9 mmol/L (ref 3.5–5.1)
SODIUM: 142 mmol/L (ref 135–145)

## 2017-09-19 LAB — CBC
HCT: 37.7 % — ABNORMAL LOW (ref 39.0–52.0)
Hemoglobin: 12.3 g/dL — ABNORMAL LOW (ref 13.0–17.0)
MCH: 27.6 pg (ref 26.0–34.0)
MCHC: 32.6 g/dL (ref 30.0–36.0)
MCV: 84.5 fL (ref 78.0–100.0)
Platelets: 136 10*3/uL — ABNORMAL LOW (ref 150–400)
RBC: 4.46 MIL/uL (ref 4.22–5.81)
RDW: 15.1 % (ref 11.5–15.5)
WBC: 10.1 10*3/uL (ref 4.0–10.5)

## 2017-09-19 LAB — MAGNESIUM: MAGNESIUM: 2 mg/dL (ref 1.7–2.4)

## 2017-09-19 MED ORDER — LEVOTHYROXINE SODIUM 50 MCG PO TABS: 50.0000 ug | ORAL_TABLET | Freq: Every day | ORAL | 1 refills | Status: DC

## 2017-09-19 MED ORDER — LINACLOTIDE 290 MCG PO CAPS: 290.0000 ug | ORAL_CAPSULE | Freq: Every day | ORAL | 1 refills | Status: DC

## 2017-09-19 MED ORDER — DOCUSATE SODIUM 100 MG PO CAPS: 100.0000 mg | ORAL_CAPSULE | Freq: Two times a day (BID) | ORAL | 0 refills | Status: DC

## 2017-09-19 MED ORDER — DOCUSATE SODIUM 100 MG PO CAPS
100.0000 mg | ORAL_CAPSULE | Freq: Two times a day (BID) | ORAL | Status: DC
  Administered 2017-09-19: 100 mg via ORAL
  Filled 2017-09-19: qty 1

## 2017-09-19 MED ORDER — LINACLOTIDE 145 MCG PO CAPS: 290.0000 ug | ORAL_CAPSULE | Freq: Every day | ORAL | Status: DC

## 2017-09-19 NOTE — Progress Notes (Signed)
Patient discharged home today per MD orders. Patient vital signs WDL. IV removed and site WDL. Discharge Instructions including follow up appointments, medications, and education reviewed with patient. Patient verbalizes understanding. Patient is transported out via wheelchair.  

## 2017-09-19 NOTE — Discharge Summary (Addendum)
Physician Discharge Summary  Philip Thompson ZOX:[id number] DOB: [birth year] DOA: 09/15/2017  PCP: [name], MD  Admit date: 09/15/2017 Discharge date: 09/19/2017  Admitted From: Home Disposition:  Home  Recommendations for Outpatient Follow-up:  1. Follow up with PCP in 1-2 weeks 2. Please obtain BMP/CBC in one week    Discharge Condition: Stable CODE STATUS: FULL Diet recommendation: soft   Brief/Interim Summary: 27 year old male with a history of chronic back pain, substance-induced mood disorder, polysubstance abuse including previous history of heroin, cocaine, THC, tobacco, alcohol, TBI, drug-induced cardiomyopathy presenting with 3-week history of increasing abdominal distention. The patient has had chronic struggles with constipation. He has had multiple imaging studies dating back to 07/23/2010 which has shown a large amount of stool burden particularly in his rectosigmoid colon. He states that he normally does not use any type of stool softener cathartic at home. He states that he routinely has 2-3 bowel movements per week. However,his last bowel movement was over 1 week prior to this admission. He states that in this past week, he has tried an over-the-counter enema on 3 occasions without effect. He denies any fevers, chills, nausea, vomiting, diarrhea, hematochezia, melena. He states that he has been clean and denies any illicit drug use at this time. He continues to smoke 1 pack/day. He has not seen his primary care physician for over 1 year. He has not sought any medical care regarding his constipation in the past. Because of increased abdominal distention, the patient presented for further evaluation. CT of the abdomen and pelvis showed massive distention of the rectosigmoid colon with stool. There is also severe distention of the remainder of the colon with stool with mild mass-effect on the majority of the intra-abdominal organs. There was also mildly  prominent bilateral inguinal lymph nodes.  Discharge Diagnoses:  Megacolon/Ogilvie's Syndrome -appreciate GI-->MOM enema x 2 with minimal results -09/18/17-->gastrograffin enema-->no obstruction-->multiple BMs -General surgery consult--discussed with Dr. Elsie Amis medical therapy first -GI cleared pt for d/c with Linzess and colace bid  Hemoptysis -CXR--bibasilar atelectasis -check coags--normal -resolved  Hypokalemia -Repleted -Check magnesium--2.0  Major depressive disorder, recurrent severe -Patient follows psychiatry -Continue BuSpar, Celexa, Depakote, Seroquel, trazodone, gabapentin  Tobacco abuse -I have discussed tobacco cessation with the patient. I have counseled the patient regarding the negative impacts of continued tobacco use including but not limited to lung cancer, COPD, and cardiovascular disease. I have discussed alternatives to tobacco and modalities that may help facilitate tobacco cessation including but not limited to biofeedback, hypnosis, and medications. Total time spent with tobacco counseling was 4 minutes.  Traumatic brain injury history -Patient follows neuropsychology and psychiatry  History of polysubstance abuse/Cannibis use -Patient states that he has been sober for over 1 year -still uses THC  Nonischemic cardiomyopathy -Patient had repeat echocardiogram June 2015 at [facility] which showed improvement of his EF  Lower extremity edema -venous duplex--neg -distended abd likely impeding venous return  Thrombocytopenia -HIV -serum B12--331 -folate--6.5 -hep B surface antigen--pending -hep C antibody--pending  Hypothyroidism -TSH 6.624 -Free T4--0.62 -start synthroid 50 mcg daily -repeat TFTs in one month   Discharge Instructions   Allergies as of 09/19/2017   No Known Allergies     Medication List    TAKE these medications   busPIRone 15 MG tablet Commonly known as:  BUSPAR Take 1 tablet (15 mg total) by mouth  3 (three) times daily.   citalopram 40 MG tablet Commonly known as:  CELEXA Take 1 tablet (40 mg total) by mouth daily.  cyclobenzaprine 10 MG tablet Commonly known as:  FLEXERIL Take 1 tablet (10 mg total) by mouth 3 (three) times daily as needed for muscle spasms.   divalproex 500 MG DR tablet Commonly known as:  DEPAKOTE Take 1 tablet (500 mg total) by mouth every 12 (twelve) hours. For mood stabilization   docusate sodium 100 MG capsule Commonly known as:  COLACE Take 1 capsule (100 mg total) by mouth 2 (two) times daily.   furosemide 20 MG tablet Commonly known as:  LASIX Take 1 tablet (20 mg total) by mouth daily. Prn leg swelling What changed:    when to take this  reasons to take this  additional instructions   gabapentin 300 MG capsule Commonly known as:  NEURONTIN Take 2 capsules (600 mg total) by mouth 3 (three) times daily. For agitation   ibuprofen 600 MG tablet Commonly known as:  ADVIL,MOTRIN Take 1 tablet (600 mg total) by mouth every 6 (six) hours as needed for moderate pain. (May buy from over the counter): For pain   levothyroxine 50 MCG tablet Commonly known as:  SYNTHROID, LEVOTHROID Take 1 tablet (50 mcg total) by mouth daily before breakfast.   linaclotide 290 MCG Caps capsule Commonly known as:  LINZESS Take 1 capsule (290 mcg total) by mouth daily before breakfast. Start taking on:  09/20/2017   potassium chloride 10 MEQ tablet Commonly known as:  K-DUR,KLOR-CON Take 1 tablet (10 mEq total) by mouth daily. With furosemide What changed:    when to take this  reasons to take this   QUEtiapine 400 MG tablet Commonly known as:  SEROQUEL Take 2 tablets (800 mg total) by mouth at bedtime.   traMADol 50 MG tablet Commonly known as:  ULTRAM Take 50 mg by mouth every 12 (twelve) hours as needed for moderate pain or severe pain.   traZODone 100 MG tablet Commonly known as:  DESYREL Take 1 tablet (100 mg total) by mouth at bedtime.       Follow-up Information    Rourk, Gerrit Friends, MD Follow up in 4 week(s).   Specialty:  Gastroenterology Contact information: [address] [place] [address] [phone]          No Known Allergies  Consultations:  GI  General surgery   Procedures/Studies: Dg Chest 2 View  Result Date: 09/18/2017 CLINICAL DATA:  Hemoptysis. EXAM: CHEST - 2 VIEW COMPARISON:  CT abdomen pelvis dated September 15, 2017. Chest x-ray dated August 27, 2013. FINDINGS: The heart size and mediastinal contours are within normal limits. Normal pulmonary vascularity. Mild subsegmental atelectasis in the right middle lobe and bilateral lower lobes. Nodular density in the lower lobes on the lateral view likely represents a vessel, as no discrete nodule is seen in this area on recent CT abdomen from 2 days ago. No acute osseous abnormality. Unchanged markedly distended colon in the upper abdomen. IMPRESSION: 1. Bibasilar atelectasis. Electronically Signed   By: Obie Dredge M.D.   On: 09/18/2017 12:16   Ct Abdomen Pelvis W Contrast  Result Date: 09/15/2017 CLINICAL DATA:  Abnormal bowel movements for 3 weeks. Abdominal distension. Abdominal discomfort. EXAM: CT ABDOMEN AND PELVIS WITH CONTRAST TECHNIQUE: Multidetector CT imaging of the abdomen and pelvis was performed using the standard protocol following bolus administration of intravenous contrast. CONTRAST:  ISOVUE-300 IOPAMIDOL (ISOVUE-300) INJECTION 61% COMPARISON:  None. FINDINGS: Lower chest: No acute findings. Hepatobiliary: No focal liver abnormality is seen. No gallstones, gallbladder wall thickening, or biliary dilatation. Pancreas: Unremarkable. No pancreatic ductal  dilatation or surrounding inflammatory changes. Spleen: Normal in size without focal abnormality. Adrenals/Urinary Tract: Adrenal glands are unremarkable. Kidneys are normal, without renal calculi, focal lesion, or hydronephrosis. Bladder is displaced anteriorly. Stomach/Bowel:  Massive distention of the rectal vault by stool. Additional severe distension of the remainder of the colon with stool. No dilated small bowel loops seen. Vascular/Lymphatic: No significant vascular findings are present. No enlarged abdominal or pelvic lymph nodes. Mildly prominent lymph nodes within the bilateral inguinal regions. Reproductive: No obvious abnormality. Other: No free fluid or abscess collection identified. No free intraperitoneal air. Musculoskeletal: No acute findings. Bilateral chronic pars interarticularis defects at the L5-S1 level with associated grade 1 spondylolisthesis. IMPRESSION: 1. Massive distension of the rectal vault and rectosigmoid colon by mostly stool and some gas, compatible with impaction and/or severe constipation. Additional fairly marked distension of the remainder of the colon, filled with stool throughout, again compatible with impaction and/or severe constipation. 2. No small bowel dilatation appreciated. 3. Majority of the intra-abdominal organs are displaced to some degree by mass effect from the distended colon. Bladder is displaced anteriorly by the massively distended rectosigmoid colon. Electronically Signed   By: Bary Richard M.D.   On: 09/15/2017 19:58   US Venous Img Lower Bilateral  Result Date: 09/16/2017 CLINICAL DATA:  Bilateral lower extremity edema for 2 months EXAM: BILATERAL LOWER EXTREMITY VENOUS DUPLEX ULTRASOUND TECHNIQUE: Doppler venous assessment of the bilateral lower extremity deep venous system was performed, including characterization of spectral flow, compressibility, and phasicity. COMPARISON:  None. FINDINGS: There is complete compressibility of the bilateral common femoral, femoral, and popliteal veins. Doppler analysis demonstrates respiratory phasicity and augmentation of flow with calf compression. No obvious superficial vein or calf vein thrombosis. IMPRESSION: No evidence of lower extremity DVT. Electronically Signed   By: Jolaine Click M.D.   On: 09/16/2017 12:45   Dg Colon W/water Sol Cm  Result Date: 09/18/2017 CLINICAL DATA:  Obstipation and severe fecal impaction. Therapeutic Gastrografin enema requested. EXAM: COLON WITH WATER SOLUTION CONTRAST COMPARISON:  CT abdomen pelvis dated September 15, 2017. FLUOROSCOPY TIME:  Radiation Exposure Index (as provided by the fluoroscopic device): 81.1 mGy. If the device does not provide the exposure index: Fluoroscopy Time:  1 minutes, 30 seconds Number of Acquired Images:  11 FINDINGS: Preliminary KUB demonstrates massive colorectal distention with stool, similar to recent CT. An enema catheter was inserted and retention balloon distended under fluoroscopy. Gastrografin was then introduced and only could be advanced to the descending colon despite repositioning the patient. Gastrografin contrast outlines the normal appearing mucosa of the rectosigmoid and descending colon. Post evacuation x-rays demonstrate significant residual stool throughout the colon. IMPRESSION: 1. Technically successful therapeutic Gastrografin enema. Contrast only reached the proximal descending colon. Significant residual stool post evacuation. Electronically Signed   By: Obie Dredge M.D.   On: 09/18/2017 15:35        Discharge Exam: Vitals:   09/18/17 2114 09/19/17 0611  BP: 125/88 117/87  Pulse: (!) 117 (!) 115  Resp: 20 18  Temp: (!) 97.3 F (36.3 C) 98.1 F (36.7 C)  SpO2: 98% 95%   Vitals:   09/18/17 1405 09/18/17 1606 09/18/17 2114 09/19/17 0611  BP: (!) 131/92 (!) 137/94 125/88 117/87  Pulse: 90 (!) 102 (!) 117 (!) 115  Resp: 18  20 18   Temp:   (!) 97.3 F (36.3 C) 98.1 F (36.7 C)  TempSrc:   Axillary Oral  SpO2: 98% 96% 98% 95%  Weight:  Height:        General: Pt is alert, awake, not in acute distress Cardiovascular: RRR, S1/S2 +, no rubs, no gallops Respiratory: CTA bilaterally, no wheezing, no rhonchi Abdominal: Soft, NT, mildly distended, bowel sounds + Extremities:  1+LE edema, no cyanosis   The results of significant diagnostics from this hospitalization (including imaging, microbiology, ancillary and laboratory) are listed below for reference.    Significant Diagnostic Studies: Dg Chest 2 View  Result Date: 09/18/2017 CLINICAL DATA:  Hemoptysis. EXAM: CHEST - 2 VIEW COMPARISON:  CT abdomen pelvis dated September 15, 2017. Chest x-ray dated August 27, 2013. FINDINGS: The heart size and mediastinal contours are within normal limits. Normal pulmonary vascularity. Mild subsegmental atelectasis in the right middle lobe and bilateral lower lobes. Nodular density in the lower lobes on the lateral view likely represents a vessel, as no discrete nodule is seen in this area on recent CT abdomen from 2 days ago. No acute osseous abnormality. Unchanged markedly distended colon in the upper abdomen. IMPRESSION: 1. Bibasilar atelectasis. Electronically Signed   By: Obie Dredge M.D.   On: 09/18/2017 12:16   Ct Abdomen Pelvis W Contrast  Result Date: 09/15/2017 CLINICAL DATA:  Abnormal bowel movements for 3 weeks. Abdominal distension. Abdominal discomfort. EXAM: CT ABDOMEN AND PELVIS WITH CONTRAST TECHNIQUE: Multidetector CT imaging of the abdomen and pelvis was performed using the standard protocol following bolus administration of intravenous contrast. CONTRAST:  ISOVUE-300 IOPAMIDOL (ISOVUE-300) INJECTION 61% COMPARISON:  None. FINDINGS: Lower chest: No acute findings. Hepatobiliary: No focal liver abnormality is seen. No gallstones, gallbladder wall thickening, or biliary dilatation. Pancreas: Unremarkable. No pancreatic ductal dilatation or surrounding inflammatory changes. Spleen: Normal in size without focal abnormality. Adrenals/Urinary Tract: Adrenal glands are unremarkable. Kidneys are normal, without renal calculi, focal lesion, or hydronephrosis. Bladder is displaced anteriorly. Stomach/Bowel: Massive distention of the rectal vault by stool. Additional severe  distension of the remainder of the colon with stool. No dilated small bowel loops seen. Vascular/Lymphatic: No significant vascular findings are present. No enlarged abdominal or pelvic lymph nodes. Mildly prominent lymph nodes within the bilateral inguinal regions. Reproductive: No obvious abnormality. Other: No free fluid or abscess collection identified. No free intraperitoneal air. Musculoskeletal: No acute findings. Bilateral chronic pars interarticularis defects at the L5-S1 level with associated grade 1 spondylolisthesis. IMPRESSION: 1. Massive distension of the rectal vault and rectosigmoid colon by mostly stool and some gas, compatible with impaction and/or severe constipation. Additional fairly marked distension of the remainder of the colon, filled with stool throughout, again compatible with impaction and/or severe constipation. 2. No small bowel dilatation appreciated. 3. Majority of the intra-abdominal organs are displaced to some degree by mass effect from the distended colon. Bladder is displaced anteriorly by the massively distended rectosigmoid colon. Electronically Signed   By: Bary Richard M.D.   On: 09/15/2017 19:58   US Venous Img Lower Bilateral  Result Date: 09/16/2017 CLINICAL DATA:  Bilateral lower extremity edema for 2 months EXAM: BILATERAL LOWER EXTREMITY VENOUS DUPLEX ULTRASOUND TECHNIQUE: Doppler venous assessment of the bilateral lower extremity deep venous system was performed, including characterization of spectral flow, compressibility, and phasicity. COMPARISON:  None. FINDINGS: There is complete compressibility of the bilateral common femoral, femoral, and popliteal veins. Doppler analysis demonstrates respiratory phasicity and augmentation of flow with calf compression. No obvious superficial vein or calf vein thrombosis. IMPRESSION: No evidence of lower extremity DVT. Electronically Signed   By: Jolaine Click M.D.   On: 09/16/2017 12:45   Dg  Colon W/water Sol Cm  Result  Date: 09/18/2017 CLINICAL DATA:  Obstipation and severe fecal impaction. Therapeutic Gastrografin enema requested. EXAM: COLON WITH WATER SOLUTION CONTRAST COMPARISON:  CT abdomen pelvis dated September 15, 2017. FLUOROSCOPY TIME:  Radiation Exposure Index (as provided by the fluoroscopic device): 81.1 mGy. If the device does not provide the exposure index: Fluoroscopy Time:  1 minutes, 30 seconds Number of Acquired Images:  11 FINDINGS: Preliminary KUB demonstrates massive colorectal distention with stool, similar to recent CT. An enema catheter was inserted and retention balloon distended under fluoroscopy. Gastrografin was then introduced and only could be advanced to the descending colon despite repositioning the patient. Gastrografin contrast outlines the normal appearing mucosa of the rectosigmoid and descending colon. Post evacuation x-rays demonstrate significant residual stool throughout the colon. IMPRESSION: 1. Technically successful therapeutic Gastrografin enema. Contrast only reached the proximal descending colon. Significant residual stool post evacuation. Electronically Signed   By: Obie Dredge M.D.   On: 09/18/2017 15:35     Microbiology: Recent Results (from the past 240 hour(s))  MRSA PCR Screening     Status: None   Collection Time: 09/15/17 10:44 PM  Result Value Ref Range Status   MRSA by PCR NEGATIVE NEGATIVE Final    Comment:        The GeneXpert MRSA Assay (FDA approved for NASAL specimens only), is one component of a comprehensive MRSA colonization surveillance program. It is not intended to diagnose MRSA infection nor to guide or monitor treatment for MRSA infections. Performed at [facility], [address], [place], [place] [address]      Labs: Basic Metabolic Panel: Recent Labs  Lab 09/15/17 1828 09/16/17 0735 09/17/17 0540 09/18/17 0439 09/19/17 0501  NA 139 140 139 139 142  K 3.2* 4.1 3.9 4.1 4.9  CL 105 109 108 111 111  CO2 27 24 24 23 25   GLUCOSE  115* 101* 112* 96 95  BUN 7 8 7 8 9   CREATININE 0.89 0.84 0.76 0.79 0.71  CALCIUM 9.2 9.0 9.0 8.9 9.2  MG 2.1  --   --  1.8 2.0  PHOS 3.3  --   --   --   --    Liver Function Tests: Recent Labs  Lab 09/15/17 1828  AST 14*  ALT 12  ALKPHOS 79  BILITOT 0.7  PROT 7.4  ALBUMIN 4.1   No results for input(s): LIPASE, AMYLASE in the last 168 hours. No results for input(s): AMMONIA in the last 168 hours. CBC: Recent Labs  Lab 09/15/17 1828 09/17/17 0540 09/18/17 0439 09/19/17 0501  WBC 6.9 8.0 7.6 10.1  NEUTROABS 4.5  --   --   --   HGB 14.7 12.5* 12.0* 12.3*  HCT 43.8 37.7* 37.0* 37.7*  MCV 85.0 84.9 84.9 84.5  PLT 146* 141* 129* 136*   Cardiac Enzymes: No results for input(s): CKTOTAL, CKMB, CKMBINDEX, TROPONINI in the last 168 hours. BNP: Invalid input(s): POCBNP CBG: No results for input(s): GLUCAP in the last 168 hours.  Time coordinating discharge:  36 minutes  Signed:  Catarina Hartshorn, DO [facility] Pager: [phone] 09/19/2017, 1:03 PM

## 2017-09-19 NOTE — Progress Notes (Signed)
Patient without complaints this morning.  Gastrografin enema yesterday revealed no obstruction and did have therapeutic benefit although quite a bit of upstream stool remains.   Vital signs in last 24 hours: Temp:  [97.3 F (36.3 C)-98.1 F (36.7 C)] 98.1 F (36.7 C) (09/02 0611) Pulse Rate:  [90-117] 115 (09/02 0611) Resp:  [15-20] 18 (09/02 0611) BP: (117-137)/(85-94) 117/87 (09/02 0611) SpO2:  [95 %-98 %] 95 % (09/02 0611) Last BM Date: 09/18/17 General:   Alert,  , pleasant and cooperative in NAD Abdomen:  Remains distended. Soft with very minimal tenderness to Palpation diffusely. Extremities:  Without clubbing or edema.    Intake/Output from previous day: 09/01 0701 - 09/02 0700 In: 2860 [P.O.:360; I.V.:2500] Out: -  Intake/Output this shift: No intake/output data recorded.  Lab Results: Recent Labs    09/17/17 0540 09/18/17 0439 09/19/17 0501  WBC 8.0 7.6 10.1  HGB 12.5* 12.0* 12.3*  HCT 37.7* 37.0* 37.7*  PLT 141* 129* 136*   BMET Recent Labs    09/17/17 0540 09/18/17 0439 09/19/17 0501  NA 139 139 142  K 3.9 4.1 4.9  CL 108 111 111  CO2 24 23 25   GLUCOSE 112* 96 95  BUN 7 8 9   CREATININE 0.76 0.79 0.71  CALCIUM 9.0 8.9 9.2   LFT No results for input(s): PROT, ALBUMIN, AST, ALT, ALKPHOS, BILITOT, BILIDIR, IBILI in the last 72 hours. PT/INR Recent Labs    09/18/17 0953  LABPROT 14.0  INR 1.09   Hepatitis Panel No results for input(s): HEPBSAG, HCVAB, HEPAIGM, HEPBIGM in the last 72 hours. C-Diff No results for input(s): CDIFFTOX in the last 72 hours.  Studies/Results: Dg Chest 2 View  Result Date: 09/18/2017 CLINICAL DATA:  Hemoptysis. EXAM: CHEST - 2 VIEW COMPARISON:  CT abdomen pelvis dated September 15, 2017. Chest x-ray dated August 27, 2013. FINDINGS: The heart size and mediastinal contours are within normal limits. Normal pulmonary vascularity. Mild subsegmental atelectasis in the right middle lobe and bilateral lower lobes. Nodular  density in the lower lobes on the lateral view likely represents a vessel, as no discrete nodule is seen in this area on recent CT abdomen from 2 days ago. No acute osseous abnormality. Unchanged markedly distended colon in the upper abdomen. IMPRESSION: 1. Bibasilar atelectasis. Electronically Signed   By: Obie Dredge M.D.   On: 09/18/2017 12:16   Dg Colon W/water Sol Cm  Result Date: 09/18/2017 CLINICAL DATA:  Obstipation and severe fecal impaction. Therapeutic Gastrografin enema requested. EXAM: COLON WITH WATER SOLUTION CONTRAST COMPARISON:  CT abdomen pelvis dated September 15, 2017. FLUOROSCOPY TIME:  Radiation Exposure Index (as provided by the fluoroscopic device): 81.1 mGy. If the device does not provide the exposure index: Fluoroscopy Time:  1 minutes, 30 seconds Number of Acquired Images:  11 FINDINGS: Preliminary KUB demonstrates massive colorectal distention with stool, similar to recent CT. An enema catheter was inserted and retention balloon distended under fluoroscopy. Gastrografin was then introduced and only could be advanced to the descending colon despite repositioning the patient. Gastrografin contrast outlines the normal appearing mucosa of the rectosigmoid and descending colon. Post evacuation x-rays demonstrate significant residual stool throughout the colon. IMPRESSION: 1. Technically successful therapeutic Gastrografin enema. Contrast only reached the proximal descending colon. Significant residual stool post evacuation. Electronically Signed   By: Obie Dredge M.D.   On: 09/18/2017 15:35   Impression:  27 year old gentleman with chronic constipation, megacolon (Ogilive's syndrome) along with recent fecal impaction. Status post Gastrografin enema which confirmed  patency of the rectosigmoid region along with some therapeutic effect although significant stool burden and distention remains..   Recommendations:  Begin Linzess 290 daily  Colace 100 mg twice daily.  If no bowel  movement in 2 days patient is to report to [place]. From a GI standpoint,  Can continue management as an outpatient - could be discharged any time.  Low residue diet.

## 2017-09-20 LAB — HEPATITIS B SURFACE ANTIGEN: Hepatitis B Surface Ag: NEGATIVE

## 2017-09-20 LAB — HIV ANTIBODY (ROUTINE TESTING W REFLEX): HIV Screen 4th Generation wRfx: NONREACTIVE

## 2017-09-20 LAB — HEPATITIS C ANTIBODY: HCV Ab: <0.1 s/co ratio (ref 0.0–0.9)

## 2017-09-21 ENCOUNTER — Other Ambulatory Visit: Payer: Self-pay

## 2017-09-21 ENCOUNTER — Telehealth: Payer: Self-pay | Admitting: Internal Medicine

## 2017-09-21 ENCOUNTER — Encounter (HOSPITAL_COMMUNITY): Payer: Self-pay | Admitting: Emergency Medicine

## 2017-09-21 ENCOUNTER — Inpatient Hospital Stay (HOSPITAL_COMMUNITY)
Admission: EM | Admit: 2017-09-21 | Discharge: 2017-10-21 | DRG: 003 | Disposition: A | Discharge status: Rehabilitation Facility | Payer: BLUE CROSS/BLUE SHIELD | Attending: Internal Medicine | Admitting: Internal Medicine

## 2017-09-21 ENCOUNTER — Emergency Department (HOSPITAL_COMMUNITY): Payer: BLUE CROSS/BLUE SHIELD

## 2017-09-21 DIAGNOSIS — F329 Major depressive disorder, single episode, unspecified: Secondary | ICD-10-CM | POA: Diagnosis present

## 2017-09-21 DIAGNOSIS — Z432 Encounter for attention to ileostomy: Secondary | ICD-10-CM | POA: Diagnosis not present

## 2017-09-21 DIAGNOSIS — J189 Pneumonia, unspecified organism: Secondary | ICD-10-CM | POA: Diagnosis not present

## 2017-09-21 DIAGNOSIS — M79A3 Nontraumatic compartment syndrome of abdomen: Secondary | ICD-10-CM | POA: Diagnosis present

## 2017-09-21 DIAGNOSIS — F1120 Opioid dependence, uncomplicated: Secondary | ICD-10-CM | POA: Diagnosis not present

## 2017-09-21 DIAGNOSIS — D72829 Elevated white blood cell count, unspecified: Secondary | ICD-10-CM | POA: Diagnosis not present

## 2017-09-21 DIAGNOSIS — K5981 Ogilvie syndrome: Secondary | ICD-10-CM

## 2017-09-21 DIAGNOSIS — Z833 Family history of diabetes mellitus: Secondary | ICD-10-CM

## 2017-09-21 DIAGNOSIS — Z79899 Other long term (current) drug therapy: Secondary | ICD-10-CM

## 2017-09-21 DIAGNOSIS — K668 Other specified disorders of peritoneum: Secondary | ICD-10-CM | POA: Diagnosis not present

## 2017-09-21 DIAGNOSIS — I428 Other cardiomyopathies: Secondary | ICD-10-CM | POA: Diagnosis not present

## 2017-09-21 DIAGNOSIS — R64 Cachexia: Secondary | ICD-10-CM | POA: Diagnosis not present

## 2017-09-21 DIAGNOSIS — K567 Ileus, unspecified: Secondary | ICD-10-CM | POA: Diagnosis not present

## 2017-09-21 DIAGNOSIS — I2699 Other pulmonary embolism without acute cor pulmonale: Secondary | ICD-10-CM

## 2017-09-21 DIAGNOSIS — K5939 Other megacolon: Secondary | ICD-10-CM | POA: Diagnosis not present

## 2017-09-21 DIAGNOSIS — F1910 Other psychoactive substance abuse, uncomplicated: Secondary | ICD-10-CM | POA: Diagnosis not present

## 2017-09-21 DIAGNOSIS — A4101 Sepsis due to Methicillin susceptible Staphylococcus aureus: Secondary | ICD-10-CM | POA: Diagnosis not present

## 2017-09-21 DIAGNOSIS — R1113 Vomiting of fecal matter: Secondary | ICD-10-CM | POA: Diagnosis not present

## 2017-09-21 DIAGNOSIS — R0902 Hypoxemia: Secondary | ICD-10-CM | POA: Diagnosis not present

## 2017-09-21 DIAGNOSIS — R5381 Other malaise: Secondary | ICD-10-CM | POA: Diagnosis not present

## 2017-09-21 DIAGNOSIS — G894 Chronic pain syndrome: Secondary | ICD-10-CM

## 2017-09-21 DIAGNOSIS — I871 Compression of vein: Secondary | ICD-10-CM | POA: Diagnosis not present

## 2017-09-21 DIAGNOSIS — Z781 Physical restraint status: Secondary | ICD-10-CM

## 2017-09-21 DIAGNOSIS — K3184 Gastroparesis: Secondary | ICD-10-CM

## 2017-09-21 DIAGNOSIS — K5641 Fecal impaction: Secondary | ICD-10-CM | POA: Diagnosis not present

## 2017-09-21 DIAGNOSIS — F32A Depression, unspecified: Secondary | ICD-10-CM

## 2017-09-21 DIAGNOSIS — F332 Major depressive disorder, recurrent severe without psychotic features: Secondary | ICD-10-CM | POA: Diagnosis not present

## 2017-09-21 DIAGNOSIS — R000 Tachycardia, unspecified: Secondary | ICD-10-CM

## 2017-09-21 DIAGNOSIS — Z01810 Encounter for preprocedural cardiovascular examination: Secondary | ICD-10-CM | POA: Diagnosis not present

## 2017-09-21 DIAGNOSIS — Z430 Encounter for attention to tracheostomy: Secondary | ICD-10-CM | POA: Diagnosis not present

## 2017-09-21 DIAGNOSIS — J90 Pleural effusion, not elsewhere classified: Secondary | ICD-10-CM | POA: Diagnosis not present

## 2017-09-21 DIAGNOSIS — Z87820 Personal history of traumatic brain injury: Secondary | ICD-10-CM | POA: Diagnosis not present

## 2017-09-21 DIAGNOSIS — F17210 Nicotine dependence, cigarettes, uncomplicated: Secondary | ICD-10-CM | POA: Diagnosis present

## 2017-09-21 DIAGNOSIS — J439 Emphysema, unspecified: Secondary | ICD-10-CM | POA: Diagnosis present

## 2017-09-21 DIAGNOSIS — Y95 Nosocomial condition: Secondary | ICD-10-CM | POA: Diagnosis not present

## 2017-09-21 DIAGNOSIS — J155 Pneumonia due to Escherichia coli: Secondary | ICD-10-CM | POA: Diagnosis not present

## 2017-09-21 DIAGNOSIS — Z419 Encounter for procedure for purposes other than remedying health state, unspecified: Secondary | ICD-10-CM

## 2017-09-21 DIAGNOSIS — Z9889 Other specified postprocedural states: Secondary | ICD-10-CM | POA: Diagnosis not present

## 2017-09-21 DIAGNOSIS — J438 Other emphysema: Secondary | ICD-10-CM | POA: Diagnosis present

## 2017-09-21 DIAGNOSIS — J690 Pneumonitis due to inhalation of food and vomit: Secondary | ICD-10-CM | POA: Diagnosis not present

## 2017-09-21 DIAGNOSIS — Z931 Gastrostomy status: Secondary | ICD-10-CM | POA: Diagnosis not present

## 2017-09-21 DIAGNOSIS — K56699 Other intestinal obstruction unspecified as to partial versus complete obstruction: Secondary | ICD-10-CM | POA: Diagnosis not present

## 2017-09-21 DIAGNOSIS — R918 Other nonspecific abnormal finding of lung field: Secondary | ICD-10-CM | POA: Diagnosis not present

## 2017-09-21 DIAGNOSIS — A4151 Sepsis due to Escherichia coli [E. coli]: Secondary | ICD-10-CM | POA: Diagnosis not present

## 2017-09-21 DIAGNOSIS — D62 Acute posthemorrhagic anemia: Secondary | ICD-10-CM | POA: Diagnosis not present

## 2017-09-21 DIAGNOSIS — R1310 Dysphagia, unspecified: Secondary | ICD-10-CM | POA: Diagnosis not present

## 2017-09-21 DIAGNOSIS — R5082 Postprocedural fever: Secondary | ICD-10-CM | POA: Diagnosis not present

## 2017-09-21 DIAGNOSIS — E876 Hypokalemia: Secondary | ICD-10-CM | POA: Diagnosis present

## 2017-09-21 DIAGNOSIS — K5909 Other constipation: Secondary | ICD-10-CM | POA: Diagnosis not present

## 2017-09-21 DIAGNOSIS — Z79890 Hormone replacement therapy (postmenopausal): Secondary | ICD-10-CM

## 2017-09-21 DIAGNOSIS — D696 Thrombocytopenia, unspecified: Secondary | ICD-10-CM | POA: Diagnosis not present

## 2017-09-21 DIAGNOSIS — I952 Hypotension due to drugs: Secondary | ICD-10-CM | POA: Diagnosis not present

## 2017-09-21 DIAGNOSIS — I824Y9 Acute embolism and thrombosis of unspecified deep veins of unspecified proximal lower extremity: Secondary | ICD-10-CM

## 2017-09-21 DIAGNOSIS — Z915 Personal history of self-harm: Secondary | ICD-10-CM

## 2017-09-21 DIAGNOSIS — F1490 Cocaine use, unspecified, uncomplicated: Secondary | ICD-10-CM | POA: Diagnosis not present

## 2017-09-21 DIAGNOSIS — Z86711 Personal history of pulmonary embolism: Secondary | ICD-10-CM | POA: Diagnosis not present

## 2017-09-21 DIAGNOSIS — Z801 Family history of malignant neoplasm of trachea, bronchus and lung: Secondary | ICD-10-CM

## 2017-09-21 DIAGNOSIS — Z79891 Long term (current) use of opiate analgesic: Secondary | ICD-10-CM

## 2017-09-21 DIAGNOSIS — J9601 Acute respiratory failure with hypoxia: Secondary | ICD-10-CM

## 2017-09-21 DIAGNOSIS — J9690 Respiratory failure, unspecified, unspecified whether with hypoxia or hypercapnia: Secondary | ICD-10-CM

## 2017-09-21 DIAGNOSIS — R042 Hemoptysis: Secondary | ICD-10-CM | POA: Diagnosis present

## 2017-09-21 DIAGNOSIS — E43 Unspecified severe protein-calorie malnutrition: Secondary | ICD-10-CM

## 2017-09-21 DIAGNOSIS — F988 Other specified behavioral and emotional disorders with onset usually occurring in childhood and adolescence: Secondary | ICD-10-CM | POA: Diagnosis present

## 2017-09-21 DIAGNOSIS — Z431 Encounter for attention to gastrostomy: Secondary | ICD-10-CM | POA: Diagnosis not present

## 2017-09-21 DIAGNOSIS — R195 Other fecal abnormalities: Secondary | ICD-10-CM | POA: Diagnosis not present

## 2017-09-21 DIAGNOSIS — K559 Vascular disorder of intestine, unspecified: Secondary | ICD-10-CM | POA: Diagnosis not present

## 2017-09-21 DIAGNOSIS — R0602 Shortness of breath: Secondary | ICD-10-CM

## 2017-09-21 DIAGNOSIS — I82409 Acute embolism and thrombosis of unspecified deep veins of unspecified lower extremity: Secondary | ICD-10-CM | POA: Diagnosis not present

## 2017-09-21 DIAGNOSIS — J9811 Atelectasis: Secondary | ICD-10-CM | POA: Diagnosis not present

## 2017-09-21 DIAGNOSIS — Z4659 Encounter for fitting and adjustment of other gastrointestinal appliance and device: Secondary | ICD-10-CM | POA: Diagnosis not present

## 2017-09-21 DIAGNOSIS — G4700 Insomnia, unspecified: Secondary | ICD-10-CM | POA: Diagnosis not present

## 2017-09-21 DIAGNOSIS — F419 Anxiety disorder, unspecified: Secondary | ICD-10-CM | POA: Diagnosis not present

## 2017-09-21 DIAGNOSIS — K5669 Other partial intestinal obstruction: Secondary | ICD-10-CM | POA: Diagnosis not present

## 2017-09-21 DIAGNOSIS — Z95828 Presence of other vascular implants and grafts: Secondary | ICD-10-CM | POA: Diagnosis not present

## 2017-09-21 DIAGNOSIS — K56609 Unspecified intestinal obstruction, unspecified as to partial versus complete obstruction: Secondary | ICD-10-CM

## 2017-09-21 DIAGNOSIS — J9819 Other pulmonary collapse: Secondary | ICD-10-CM | POA: Diagnosis not present

## 2017-09-21 DIAGNOSIS — K6389 Other specified diseases of intestine: Secondary | ICD-10-CM

## 2017-09-21 DIAGNOSIS — Z9049 Acquired absence of other specified parts of digestive tract: Secondary | ICD-10-CM | POA: Diagnosis not present

## 2017-09-21 DIAGNOSIS — Z978 Presence of other specified devices: Secondary | ICD-10-CM

## 2017-09-21 DIAGNOSIS — E162 Hypoglycemia, unspecified: Secondary | ICD-10-CM | POA: Diagnosis not present

## 2017-09-21 DIAGNOSIS — R112 Nausea with vomiting, unspecified: Secondary | ICD-10-CM | POA: Diagnosis not present

## 2017-09-21 DIAGNOSIS — E039 Hypothyroidism, unspecified: Secondary | ICD-10-CM | POA: Diagnosis not present

## 2017-09-21 DIAGNOSIS — Z4682 Encounter for fitting and adjustment of non-vascular catheter: Secondary | ICD-10-CM | POA: Diagnosis not present

## 2017-09-21 DIAGNOSIS — G931 Anoxic brain damage, not elsewhere classified: Secondary | ICD-10-CM | POA: Diagnosis not present

## 2017-09-21 DIAGNOSIS — R140 Abdominal distension (gaseous): Secondary | ICD-10-CM

## 2017-09-21 DIAGNOSIS — F1290 Cannabis use, unspecified, uncomplicated: Secondary | ICD-10-CM | POA: Diagnosis not present

## 2017-09-21 DIAGNOSIS — L8990 Pressure ulcer of unspecified site, unspecified stage: Secondary | ICD-10-CM

## 2017-09-21 DIAGNOSIS — M549 Dorsalgia, unspecified: Secondary | ICD-10-CM | POA: Diagnosis present

## 2017-09-21 DIAGNOSIS — K598 Other specified functional intestinal disorders: Secondary | ICD-10-CM

## 2017-09-21 DIAGNOSIS — Z6820 Body mass index (BMI) 20.0-20.9, adult: Secondary | ICD-10-CM

## 2017-09-21 DIAGNOSIS — R7309 Other abnormal glucose: Secondary | ICD-10-CM | POA: Diagnosis not present

## 2017-09-21 DIAGNOSIS — Z930 Tracheostomy status: Secondary | ICD-10-CM | POA: Diagnosis not present

## 2017-09-21 DIAGNOSIS — D638 Anemia in other chronic diseases classified elsewhere: Secondary | ICD-10-CM | POA: Diagnosis present

## 2017-09-21 DIAGNOSIS — I429 Cardiomyopathy, unspecified: Secondary | ICD-10-CM | POA: Diagnosis not present

## 2017-09-21 DIAGNOSIS — I361 Nonrheumatic tricuspid (valve) insufficiency: Secondary | ICD-10-CM | POA: Diagnosis not present

## 2017-09-21 DIAGNOSIS — I441 Atrioventricular block, second degree: Secondary | ICD-10-CM | POA: Diagnosis present

## 2017-09-21 DIAGNOSIS — J181 Lobar pneumonia, unspecified organism: Secondary | ICD-10-CM | POA: Diagnosis not present

## 2017-09-21 DIAGNOSIS — Z5329 Procedure and treatment not carried out because of patient's decision for other reasons: Secondary | ICD-10-CM | POA: Diagnosis present

## 2017-09-21 DIAGNOSIS — R600 Localized edema: Secondary | ICD-10-CM | POA: Diagnosis present

## 2017-09-21 DIAGNOSIS — Z8249 Family history of ischemic heart disease and other diseases of the circulatory system: Secondary | ICD-10-CM

## 2017-09-21 DIAGNOSIS — J15211 Pneumonia due to Methicillin susceptible Staphylococcus aureus: Secondary | ICD-10-CM | POA: Diagnosis not present

## 2017-09-21 DIAGNOSIS — R0682 Tachypnea, not elsewhere classified: Secondary | ICD-10-CM | POA: Diagnosis not present

## 2017-09-21 DIAGNOSIS — Z7901 Long term (current) use of anticoagulants: Secondary | ICD-10-CM | POA: Diagnosis not present

## 2017-09-21 DIAGNOSIS — E871 Hypo-osmolality and hyponatremia: Secondary | ICD-10-CM | POA: Diagnosis not present

## 2017-09-21 DIAGNOSIS — Z932 Ileostomy status: Secondary | ICD-10-CM | POA: Diagnosis not present

## 2017-09-21 DIAGNOSIS — Z811 Family history of alcohol abuse and dependence: Secondary | ICD-10-CM

## 2017-09-21 DIAGNOSIS — R451 Restlessness and agitation: Secondary | ICD-10-CM | POA: Diagnosis not present

## 2017-09-21 DIAGNOSIS — E46 Unspecified protein-calorie malnutrition: Secondary | ICD-10-CM

## 2017-09-21 DIAGNOSIS — Z791 Long term (current) use of non-steroidal anti-inflammatories (NSAID): Secondary | ICD-10-CM

## 2017-09-21 DIAGNOSIS — K6289 Other specified diseases of anus and rectum: Secondary | ICD-10-CM | POA: Diagnosis not present

## 2017-09-21 HISTORY — DX: Other symptoms and signs involving appearance and behavior: R46.89

## 2017-09-21 HISTORY — DX: Other psychoactive substance abuse, uncomplicated: F19.10

## 2017-09-21 HISTORY — DX: Fecal impaction: K56.41

## 2017-09-21 HISTORY — DX: Depression, unspecified: F32.A

## 2017-09-21 HISTORY — DX: Suicide attempt, initial encounter: T14.91XA

## 2017-09-21 HISTORY — DX: Major depressive disorder, single episode, unspecified: F32.9

## 2017-09-21 HISTORY — DX: Cardiomyopathy, unspecified: I42.9

## 2017-09-21 HISTORY — DX: Constipation, unspecified: K59.00

## 2017-09-21 LAB — I-STAT CG4 LACTIC ACID, ED: Lactic Acid, Venous: 1.06 mmol/L (ref 0.5–1.9)

## 2017-09-21 LAB — MAGNESIUM: Magnesium: 1.9 mg/dL (ref 1.7–2.4)

## 2017-09-21 LAB — COMPREHENSIVE METABOLIC PANEL
ALT: 13 U/L (ref 0–44)
AST: 18 U/L (ref 15–41)
Albumin: 4.3 g/dL (ref 3.5–5.0)
Alkaline Phosphatase: 92 U/L (ref 38–126)
Anion gap: 9 (ref 5–15)
BUN: 10 mg/dL (ref 6–20)
CHLORIDE: 103 mmol/L (ref 98–111)
CO2: 27 mmol/L (ref 22–32)
Calcium: 9.9 mg/dL (ref 8.9–10.3)
Creatinine, Ser: 0.66 mg/dL (ref 0.61–1.24)
GFR calc Af Amer: >60 mL/min (ref >60)
GFR calc non Af Amer: >60 mL/min (ref >60)
Glucose, Bld: 105 mg/dL — ABNORMAL HIGH (ref 70–99)
POTASSIUM: 3.7 mmol/L (ref 3.5–5.1)
Sodium: 139 mmol/L (ref 135–145)
Total Bilirubin: 0.8 mg/dL (ref 0.3–1.2)
Total Protein: 8.2 g/dL — ABNORMAL HIGH (ref 6.5–8.1)

## 2017-09-21 LAB — CBC
HEMATOCRIT: 43.5 % (ref 39.0–52.0)
Hemoglobin: 14.8 g/dL (ref 13.0–17.0)
MCH: 28.6 pg (ref 26.0–34.0)
MCHC: 34 g/dL (ref 30.0–36.0)
MCV: 84.1 fL (ref 78.0–100.0)
Platelets: 170 10*3/uL (ref 150–400)
RBC: 5.17 MIL/uL (ref 4.22–5.81)
RDW: 14.7 % (ref 11.5–15.5)
WBC: 12.3 10*3/uL — ABNORMAL HIGH (ref 4.0–10.5)

## 2017-09-21 LAB — LIPASE, BLOOD: LIPASE: 37 U/L (ref 11–51)

## 2017-09-21 MED ORDER — ONDANSETRON HCL 4 MG/2ML IJ SOLN
4.0000 mg | Freq: Once | INTRAMUSCULAR | Status: AC
  Administered 2017-09-22: 4 mg via INTRAVENOUS
  Filled 2017-09-21: qty 2

## 2017-09-21 MED ORDER — MORPHINE SULFATE (PF) 4 MG/ML IV SOLN
4.0000 mg | Freq: Once | INTRAVENOUS | Status: AC
  Administered 2017-09-21: 4 mg via INTRAVENOUS
  Filled 2017-09-21: qty 1

## 2017-09-21 MED ORDER — SODIUM CHLORIDE 0.9 % IV BOLUS
1000.0000 mL | Freq: Once | INTRAVENOUS | Status: AC
  Administered 2017-09-22: 1000 mL via INTRAVENOUS

## 2017-09-21 NOTE — ED Triage Notes (Signed)
Pt recently admitted for megacolon and had little BM while in hospital. Now pt unable to eat solids, abd is severely distended. Pt pale and unable to pass stool.

## 2017-09-21 NOTE — ED Notes (Signed)
Bladder Scan Performed. urine measured. Rancour, MD notified.

## 2017-09-21 NOTE — Telephone Encounter (Signed)
Spoke with pts mother. Pt isn't improving since being discharged from [facility] on  09/19/17. Pt is having abdomen pain with abdomen swelling. Coughing with a small amount a blood coming up, vomiting off and on. Pt started Colace, synthroid and Linzess as directed. Pt hasn't had a bowel movement since he was released from the hospital. Pts mother was advised to take pt back to the ED if pt vomits uncontrollable and symptoms worsen.

## 2017-09-21 NOTE — Telephone Encounter (Signed)
[name] mother, Lynden Ang, called wanting to let RMR that patient isn't any better since his ER visit on 9/2 and wanted to know what he would recommend. I offer her an OV for tomorrow but she didn't want to accept it. She would rather speak to the nurse. [phone]

## 2017-09-22 ENCOUNTER — Encounter (HOSPITAL_COMMUNITY): Payer: Self-pay | Admitting: Family Medicine

## 2017-09-22 ENCOUNTER — Inpatient Hospital Stay (HOSPITAL_COMMUNITY): Payer: BLUE CROSS/BLUE SHIELD

## 2017-09-22 ENCOUNTER — Other Ambulatory Visit (HOSPITAL_COMMUNITY): Payer: Self-pay

## 2017-09-22 ENCOUNTER — Emergency Department (HOSPITAL_COMMUNITY): Payer: BLUE CROSS/BLUE SHIELD

## 2017-09-22 DIAGNOSIS — I361 Nonrheumatic tricuspid (valve) insufficiency: Secondary | ICD-10-CM

## 2017-09-22 DIAGNOSIS — Z431 Encounter for attention to gastrostomy: Secondary | ICD-10-CM | POA: Diagnosis not present

## 2017-09-22 DIAGNOSIS — Z01810 Encounter for preprocedural cardiovascular examination: Secondary | ICD-10-CM | POA: Diagnosis not present

## 2017-09-22 DIAGNOSIS — F1490 Cocaine use, unspecified, uncomplicated: Secondary | ICD-10-CM | POA: Diagnosis not present

## 2017-09-22 DIAGNOSIS — F17210 Nicotine dependence, cigarettes, uncomplicated: Secondary | ICD-10-CM | POA: Diagnosis not present

## 2017-09-22 DIAGNOSIS — D62 Acute posthemorrhagic anemia: Secondary | ICD-10-CM | POA: Diagnosis not present

## 2017-09-22 DIAGNOSIS — J181 Lobar pneumonia, unspecified organism: Secondary | ICD-10-CM | POA: Diagnosis not present

## 2017-09-22 DIAGNOSIS — F419 Anxiety disorder, unspecified: Secondary | ICD-10-CM | POA: Diagnosis not present

## 2017-09-22 DIAGNOSIS — Z430 Encounter for attention to tracheostomy: Secondary | ICD-10-CM | POA: Diagnosis not present

## 2017-09-22 DIAGNOSIS — K6289 Other specified diseases of anus and rectum: Secondary | ICD-10-CM | POA: Diagnosis not present

## 2017-09-22 DIAGNOSIS — K5909 Other constipation: Secondary | ICD-10-CM | POA: Diagnosis present

## 2017-09-22 DIAGNOSIS — I2699 Other pulmonary embolism without acute cor pulmonale: Secondary | ICD-10-CM | POA: Diagnosis not present

## 2017-09-22 DIAGNOSIS — J9819 Other pulmonary collapse: Secondary | ICD-10-CM | POA: Diagnosis not present

## 2017-09-22 DIAGNOSIS — G931 Anoxic brain damage, not elsewhere classified: Secondary | ICD-10-CM | POA: Diagnosis not present

## 2017-09-22 DIAGNOSIS — M549 Dorsalgia, unspecified: Secondary | ICD-10-CM | POA: Diagnosis present

## 2017-09-22 DIAGNOSIS — F1120 Opioid dependence, uncomplicated: Secondary | ICD-10-CM | POA: Diagnosis not present

## 2017-09-22 DIAGNOSIS — Y95 Nosocomial condition: Secondary | ICD-10-CM | POA: Diagnosis not present

## 2017-09-22 DIAGNOSIS — E039 Hypothyroidism, unspecified: Secondary | ICD-10-CM | POA: Diagnosis not present

## 2017-09-22 DIAGNOSIS — R918 Other nonspecific abnormal finding of lung field: Secondary | ICD-10-CM | POA: Diagnosis not present

## 2017-09-22 DIAGNOSIS — R000 Tachycardia, unspecified: Secondary | ICD-10-CM | POA: Diagnosis not present

## 2017-09-22 DIAGNOSIS — A4101 Sepsis due to Methicillin susceptible Staphylococcus aureus: Secondary | ICD-10-CM | POA: Diagnosis not present

## 2017-09-22 DIAGNOSIS — K5641 Fecal impaction: Secondary | ICD-10-CM

## 2017-09-22 DIAGNOSIS — R5381 Other malaise: Secondary | ICD-10-CM | POA: Diagnosis not present

## 2017-09-22 DIAGNOSIS — J690 Pneumonitis due to inhalation of food and vomit: Secondary | ICD-10-CM | POA: Diagnosis not present

## 2017-09-22 DIAGNOSIS — R1310 Dysphagia, unspecified: Secondary | ICD-10-CM | POA: Diagnosis not present

## 2017-09-22 DIAGNOSIS — F1910 Other psychoactive substance abuse, uncomplicated: Secondary | ICD-10-CM | POA: Diagnosis not present

## 2017-09-22 DIAGNOSIS — R64 Cachexia: Secondary | ICD-10-CM | POA: Diagnosis not present

## 2017-09-22 DIAGNOSIS — K5939 Other megacolon: Secondary | ICD-10-CM | POA: Diagnosis not present

## 2017-09-22 DIAGNOSIS — J439 Emphysema, unspecified: Secondary | ICD-10-CM | POA: Diagnosis present

## 2017-09-22 DIAGNOSIS — K5669 Other partial intestinal obstruction: Secondary | ICD-10-CM | POA: Diagnosis not present

## 2017-09-22 DIAGNOSIS — J155 Pneumonia due to Escherichia coli: Secondary | ICD-10-CM | POA: Diagnosis not present

## 2017-09-22 DIAGNOSIS — M79A3 Nontraumatic compartment syndrome of abdomen: Secondary | ICD-10-CM | POA: Diagnosis not present

## 2017-09-22 DIAGNOSIS — Z931 Gastrostomy status: Secondary | ICD-10-CM | POA: Diagnosis not present

## 2017-09-22 DIAGNOSIS — G894 Chronic pain syndrome: Secondary | ICD-10-CM | POA: Diagnosis not present

## 2017-09-22 DIAGNOSIS — K559 Vascular disorder of intestine, unspecified: Secondary | ICD-10-CM | POA: Diagnosis not present

## 2017-09-22 DIAGNOSIS — F988 Other specified behavioral and emotional disorders with onset usually occurring in childhood and adolescence: Secondary | ICD-10-CM | POA: Diagnosis present

## 2017-09-22 DIAGNOSIS — F329 Major depressive disorder, single episode, unspecified: Secondary | ICD-10-CM | POA: Diagnosis not present

## 2017-09-22 DIAGNOSIS — K598 Other specified functional intestinal disorders: Secondary | ICD-10-CM | POA: Diagnosis not present

## 2017-09-22 DIAGNOSIS — K3184 Gastroparesis: Secondary | ICD-10-CM | POA: Diagnosis not present

## 2017-09-22 DIAGNOSIS — E43 Unspecified severe protein-calorie malnutrition: Secondary | ICD-10-CM | POA: Diagnosis not present

## 2017-09-22 DIAGNOSIS — F332 Major depressive disorder, recurrent severe without psychotic features: Secondary | ICD-10-CM | POA: Diagnosis not present

## 2017-09-22 DIAGNOSIS — I871 Compression of vein: Secondary | ICD-10-CM | POA: Diagnosis present

## 2017-09-22 DIAGNOSIS — J9811 Atelectasis: Secondary | ICD-10-CM | POA: Diagnosis not present

## 2017-09-22 DIAGNOSIS — J90 Pleural effusion, not elsewhere classified: Secondary | ICD-10-CM | POA: Diagnosis not present

## 2017-09-22 DIAGNOSIS — Z930 Tracheostomy status: Secondary | ICD-10-CM | POA: Diagnosis not present

## 2017-09-22 DIAGNOSIS — R0902 Hypoxemia: Secondary | ICD-10-CM | POA: Diagnosis not present

## 2017-09-22 DIAGNOSIS — Z87820 Personal history of traumatic brain injury: Secondary | ICD-10-CM | POA: Diagnosis not present

## 2017-09-22 DIAGNOSIS — A4151 Sepsis due to Escherichia coli [E. coli]: Secondary | ICD-10-CM | POA: Diagnosis not present

## 2017-09-22 DIAGNOSIS — J15211 Pneumonia due to Methicillin susceptible Staphylococcus aureus: Secondary | ICD-10-CM | POA: Diagnosis not present

## 2017-09-22 DIAGNOSIS — Z432 Encounter for attention to ileostomy: Secondary | ICD-10-CM | POA: Diagnosis not present

## 2017-09-22 DIAGNOSIS — D696 Thrombocytopenia, unspecified: Secondary | ICD-10-CM | POA: Diagnosis not present

## 2017-09-22 DIAGNOSIS — I428 Other cardiomyopathies: Secondary | ICD-10-CM | POA: Diagnosis not present

## 2017-09-22 DIAGNOSIS — K6389 Other specified diseases of intestine: Secondary | ICD-10-CM

## 2017-09-22 DIAGNOSIS — R7309 Other abnormal glucose: Secondary | ICD-10-CM | POA: Diagnosis not present

## 2017-09-22 DIAGNOSIS — K56609 Unspecified intestinal obstruction, unspecified as to partial versus complete obstruction: Secondary | ICD-10-CM | POA: Diagnosis not present

## 2017-09-22 DIAGNOSIS — I82409 Acute embolism and thrombosis of unspecified deep veins of unspecified lower extremity: Secondary | ICD-10-CM | POA: Diagnosis not present

## 2017-09-22 DIAGNOSIS — R140 Abdominal distension (gaseous): Secondary | ICD-10-CM | POA: Diagnosis not present

## 2017-09-22 DIAGNOSIS — J9601 Acute respiratory failure with hypoxia: Secondary | ICD-10-CM | POA: Diagnosis not present

## 2017-09-22 DIAGNOSIS — R112 Nausea with vomiting, unspecified: Secondary | ICD-10-CM | POA: Diagnosis not present

## 2017-09-22 DIAGNOSIS — Z4682 Encounter for fitting and adjustment of non-vascular catheter: Secondary | ICD-10-CM | POA: Diagnosis not present

## 2017-09-22 DIAGNOSIS — J9690 Respiratory failure, unspecified, unspecified whether with hypoxia or hypercapnia: Secondary | ICD-10-CM | POA: Diagnosis not present

## 2017-09-22 DIAGNOSIS — R042 Hemoptysis: Secondary | ICD-10-CM | POA: Diagnosis present

## 2017-09-22 DIAGNOSIS — J189 Pneumonia, unspecified organism: Secondary | ICD-10-CM | POA: Diagnosis not present

## 2017-09-22 DIAGNOSIS — E162 Hypoglycemia, unspecified: Secondary | ICD-10-CM | POA: Diagnosis not present

## 2017-09-22 DIAGNOSIS — K567 Ileus, unspecified: Secondary | ICD-10-CM | POA: Diagnosis not present

## 2017-09-22 DIAGNOSIS — R0602 Shortness of breath: Secondary | ICD-10-CM | POA: Diagnosis not present

## 2017-09-22 DIAGNOSIS — E876 Hypokalemia: Secondary | ICD-10-CM | POA: Diagnosis present

## 2017-09-22 DIAGNOSIS — K668 Other specified disorders of peritoneum: Secondary | ICD-10-CM | POA: Diagnosis not present

## 2017-09-22 DIAGNOSIS — E871 Hypo-osmolality and hyponatremia: Secondary | ICD-10-CM | POA: Diagnosis not present

## 2017-09-22 DIAGNOSIS — D72829 Elevated white blood cell count, unspecified: Secondary | ICD-10-CM | POA: Diagnosis not present

## 2017-09-22 LAB — APTT: aPTT: 37 seconds — ABNORMAL HIGH (ref 24–36)

## 2017-09-22 LAB — BASIC METABOLIC PANEL
ANION GAP: 7 (ref 5–15)
BUN: 10 mg/dL (ref 6–20)
CHLORIDE: 105 mmol/L (ref 98–111)
CO2: 27 mmol/L (ref 22–32)
Calcium: 9 mg/dL (ref 8.9–10.3)
Creatinine, Ser: 0.58 mg/dL — ABNORMAL LOW (ref 0.61–1.24)
GFR calc non Af Amer: >60 mL/min (ref >60)
Glucose, Bld: 111 mg/dL — ABNORMAL HIGH (ref 70–99)
POTASSIUM: 4 mmol/L (ref 3.5–5.1)
Sodium: 139 mmol/L (ref 135–145)

## 2017-09-22 LAB — ECHOCARDIOGRAM COMPLETE
Height: 68 in
WEIGHTICAEL: 3308.66 [oz_av]

## 2017-09-22 LAB — TROPONIN I

## 2017-09-22 LAB — PROTIME-INR
INR: 1.1
PROTHROMBIN TIME: 14.1 s (ref 11.4–15.2)

## 2017-09-22 LAB — HEPARIN LEVEL (UNFRACTIONATED): Heparin Unfractionated: 0.33 IU/mL (ref 0.30–0.70)

## 2017-09-22 MED ORDER — IOPAMIDOL (ISOVUE-370) INJECTION 76%
100.0000 mL | Freq: Once | INTRAVENOUS | Status: AC | PRN
  Administered 2017-09-22: 100 mL via INTRAVENOUS

## 2017-09-22 MED ORDER — LORAZEPAM 2 MG/ML IJ SOLN
0.5000 mg | Freq: Four times a day (QID) | INTRAMUSCULAR | Status: DC | PRN
  Administered 2017-09-22 – 2017-09-23 (×3): 0.5 mg via INTRAVENOUS
  Filled 2017-09-22 (×4): qty 1

## 2017-09-22 MED ORDER — SODIUM CHLORIDE 0.9% FLUSH
3.0000 mL | Freq: Two times a day (BID) | INTRAVENOUS | Status: DC
  Administered 2017-09-23 – 2017-10-16 (×31): 3 mL via INTRAVENOUS
  Administered 2017-10-16: 10 mL via INTRAVENOUS
  Administered 2017-10-17: 3 mL via INTRAVENOUS
  Administered 2017-10-17: 10 mL via INTRAVENOUS
  Administered 2017-10-18 – 2017-10-21 (×3): 3 mL via INTRAVENOUS

## 2017-09-22 MED ORDER — SODIUM CHLORIDE 0.45 % IV SOLN
INTRAVENOUS | Status: AC
  Administered 2017-09-22: 02:00:00 via INTRAVENOUS

## 2017-09-22 MED ORDER — NICOTINE 21 MG/24HR TD PT24
21.0000 mg | MEDICATED_PATCH | Freq: Every day | TRANSDERMAL | Status: DC | PRN
  Filled 2017-09-22: qty 1

## 2017-09-22 MED ORDER — ONDANSETRON HCL 4 MG PO TABS
4.0000 mg | ORAL_TABLET | Freq: Four times a day (QID) | ORAL | Status: DC | PRN
  Filled 2017-09-22: qty 1

## 2017-09-22 MED ORDER — MORPHINE SULFATE (PF) 2 MG/ML IV SOLN
2.0000 mg | INTRAVENOUS | Status: AC | PRN
  Administered 2017-09-22: 2 mg via INTRAVENOUS
  Filled 2017-09-22: qty 1

## 2017-09-22 MED ORDER — MILK AND MOLASSES ENEMA
1.0000 | Freq: Once | RECTAL | Status: DC
  Filled 2017-09-22: qty 250

## 2017-09-22 MED ORDER — SODIUM CHLORIDE 0.9 % IV BOLUS: 1000.0000 mL | Freq: Once | INTRAVENOUS | Status: DC

## 2017-09-22 MED ORDER — ACETAMINOPHEN 325 MG PO TABS: 650.0000 mg | ORAL_TABLET | Freq: Four times a day (QID) | ORAL | Status: DC | PRN

## 2017-09-22 MED ORDER — KETOROLAC TROMETHAMINE 30 MG/ML IJ SOLN
15.0000 mg | Freq: Four times a day (QID) | INTRAMUSCULAR | Status: DC | PRN
  Administered 2017-09-22 – 2017-09-26 (×16): 30 mg via INTRAVENOUS
  Filled 2017-09-22 (×17): qty 1

## 2017-09-22 MED ORDER — ONDANSETRON HCL 4 MG/2ML IJ SOLN
4.0000 mg | Freq: Four times a day (QID) | INTRAMUSCULAR | Status: DC | PRN
  Administered 2017-09-29 – 2017-10-07 (×3): 4 mg via INTRAVENOUS
  Filled 2017-09-22 (×3): qty 2

## 2017-09-22 MED ORDER — ACETAMINOPHEN 650 MG RE SUPP: 650.0000 mg | Freq: Four times a day (QID) | RECTAL | Status: DC | PRN

## 2017-09-22 MED ORDER — HEPARIN (PORCINE) IN NACL 100-0.45 UNIT/ML-% IJ SOLN
2050.0000 [IU]/h | INTRAMUSCULAR | Status: AC
  Administered 2017-09-22 (×2): 1500 [IU]/h via INTRAVENOUS
  Administered 2017-09-23 – 2017-09-25 (×4): 1800 [IU]/h via INTRAVENOUS
  Administered 2017-09-25 – 2017-09-26 (×3): 2050 [IU]/h via INTRAVENOUS
  Filled 2017-09-22 (×9): qty 250

## 2017-09-22 MED ORDER — HEPARIN BOLUS VIA INFUSION
6000.0000 [IU] | Freq: Once | INTRAVENOUS | Status: AC
  Administered 2017-09-22: 6000 [IU] via INTRAVENOUS

## 2017-09-22 MED ORDER — MORPHINE SULFATE (PF) 2 MG/ML IV SOLN: 2.0000 mg | INTRAVENOUS | Status: DC | PRN

## 2017-09-22 NOTE — Progress Notes (Signed)
ANTICOAGULATION CONSULT NOTE - Preliminary  Pharmacy Consult for Heparin Indication: Pulmonary embolism  No Known Allergies  Patient Measurements: Height: 5\' 8"  (172.7 cm) Weight: 206 lb 12.7 oz (93.8 kg) IBW/kg (Calculated) : 68.4 HEPARIN DW (KG): 88   Vital Signs: Temp: 97.9 F (36.6 C) (09/04 2204) Temp Source: Oral (09/04 2204) BP: 143/84 (09/05 0004) Pulse Rate: 116 (09/05 0004)  Labs: Recent Labs    09/19/17 0501 09/21/17 2237  HGB 12.3* 14.8  HCT 37.7* 43.5  PLT 136* 170  CREATININE 0.71 0.66   Estimated Creatinine Clearance: 154.2 mL/min (by C-G formula based on SCr of 0.66 mg/dL).  Medical History: Past Medical History:  Diagnosis Date  . ADD (attention deficit disorder)   . Allergic rhinitis   . Anxiety   . Arthritis    joint pain  . Chronic back pain   . Frontal head injury   . Heroin overdose (HCC)     Medications:   Assessment: 27 yo male seen in the ED for severe abdominal pain and SOB. Radiology results show acute pulmonary embolism. Pharmacy has been consulted for IV heparin dosing.  Goal of Therapy:  Heparin level goal: 0.3-0.7 units/ml Monitor platelets by anticoagulation protocol: Yes   Plan:  Heparin I bolus: 6000 units x 1 Heparin infusion: 1500 units/hr Heparin level in 6-8 hrs  Preliminary review of pertinent patient information completed.  Jeani Hawking clinical pharmacist will complete review during morning rounds to assess the patient and finalize treatment regimen.  Arelia Sneddon, [facility] 09/22/2017,1:34 AM
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
## 2017-09-22 NOTE — Progress Notes (Signed)
ANTICOAGULATION CONSULT NOTE - Initial Consult  Pharmacy Consult for heparin Indication: pulmonary embolus  No Known Allergies  Patient Measurements: Height: 5\' 8"  (172.7 cm) Weight: 206 lb 12.7 oz (93.8 kg) IBW/kg (Calculated) : 68.4 Heparin Dosing Weight: 88 kg  Vital Signs: Temp: 98.5 F (36.9 C) (09/05 0424) Temp Source: Oral (09/05 0424) BP: 141/101 (09/05 0424) Pulse Rate: 99 (09/05 0424)  Labs: Recent Labs    09/21/17 2237 09/22/17 0212 09/22/17 0553 09/22/17 1025  HGB 14.8  --   --   --   HCT 43.5  --   --   --   PLT 170  --   --   --   APTT 37*  --   --   --   LABPROT 14.1  --   --   --   INR 1.10  --   --   --   HEPARINUNFRC  --   --   --  0.33  CREATININE 0.66  --  0.58*  --   TROPONINI  --  <0.03  --   --     Estimated Creatinine Clearance: 154.2 mL/min (A) (by C-G formula based on SCr of 0.58 mg/dL (L)).   Medical History: Past Medical History:  Diagnosis Date  . ADD (attention deficit disorder)   . Allergic rhinitis   . Anxiety   . Arthritis    joint pain  . Chronic back pain   . Frontal head injury   . Heroin overdose (HCC)     Medications:  Medications Prior to Admission  Medication Sig Dispense Refill Last Dose  . busPIRone (BUSPAR) 15 MG tablet Take 1 tablet (15 mg total) by mouth 3 (three) times daily. 90 tablet 3 09/21/2017 at Unknown time  . citalopram (CELEXA) 40 MG tablet Take 1 tablet (40 mg total) by mouth daily. 90 tablet 0 09/21/2017 at Unknown time  . cyclobenzaprine (FLEXERIL) 10 MG tablet Take 1 tablet (10 mg total) by mouth 3 (three) times daily as needed for muscle spasms. 30 tablet 0 Past Week at Unknown time  . divalproex (DEPAKOTE) 500 MG DR tablet Take 1 tablet (500 mg total) by mouth every 12 (twelve) hours. For mood stabilization 180 tablet 0 09/21/2017 at Unknown time  . docusate sodium (COLACE) 100 MG capsule Take 1 capsule (100 mg total) by mouth 2 (two) times daily. (Patient taking differently: Take 100 mg by mouth  daily. ) 60 capsule 0 09/21/2017 at Unknown time  . furosemide (LASIX) 20 MG tablet Take 1 tablet (20 mg total) by mouth daily. Prn leg swelling (Patient taking differently: Take 20 mg by mouth daily as needed for fluid or edema. As needed for leg swelling) 30 tablet 2 unknown  . gabapentin (NEURONTIN) 300 MG capsule Take 2 capsules (600 mg total) by mouth 3 (three) times daily. For agitation 540 capsule 0 Past Week at Unknown time  . ibuprofen (ADVIL,MOTRIN) 600 MG tablet Take 1 tablet (600 mg total) by mouth every 6 (six) hours as needed for moderate pain. (May buy from over the counter): For pain 1 tablet 0 Past Month at Unknown time  . levothyroxine (SYNTHROID) 50 MCG tablet Take 1 tablet (50 mcg total) by mouth daily before breakfast. 30 tablet 1 09/21/2017 at Unknown time  . linaclotide (LINZESS) 290 MCG CAPS capsule Take 1 capsule (290 mcg total) by mouth daily before breakfast. 30 capsule 1 09/21/2017 at Unknown time  . potassium chloride (K-DUR,KLOR-CON) 10 MEQ tablet Take 1 tablet (10 mEq total)  by mouth daily. With furosemide (Patient taking differently: Take 10 mEq by mouth daily as needed (only takes when Lasix (Furosemide)). With furosemide) 30 tablet 2 unknown  . QUEtiapine (SEROQUEL) 400 MG tablet Take 2 tablets (800 mg total) by mouth at bedtime. 180 tablet 0 09/21/2017 at Unknown time  . traMADol (ULTRAM) 50 MG tablet Take 50 mg by mouth every 12 (twelve) hours as needed for moderate pain or severe pain.   0 09/21/2017 at Unknown time  . traZODone (DESYREL) 100 MG tablet Take 1 tablet (100 mg total) by mouth at bedtime. 90 tablet 0 09/21/2017 at Unknown time    Assessment: 27 yo male seen in the ED for severe abdominal pain and SOB. Radiology results show acute pulmonary embolism. Pharmacy has been consulted for IV heparin dosing.  Heparin level therapeutic at 0.33  Goal of Therapy:  Heparin level 0.3-0.7 units/ml Monitor platelets by anticoagulation protocol: Yes   Plan:  Continue heparin  infusion at 1500 units/hr Check anti-Xa level daily while on heparin Continue to monitor H&H and platelets   Judeth Cornfield, PharmD Clinical Pharmacist 09/22/2017 11:46 AM

## 2017-09-22 NOTE — Progress Notes (Signed)
Patient ordered NG tube, rectal tube, and milk of molasses enema.  Explained to patient that procedures/treatments were for patient benefit, and he may have some pain relief.  Patient refused all three treatments.
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
## 2017-09-22 NOTE — Progress Notes (Signed)
Patient briefly seen and examined, database reviewed.  Patient admitted earlier today due to cough with hemoptysis as well as continued abdominal pain and distention.  He is known to have massive colon dilatation/Ogilvie and has failed/refused medical therapy as per latest GI recommendations.  He is at imminent risk of perforation, have discussed case with Dr. Lovell Sheehan who will see in consultation.  For his new PE, CT does not demonstrate right heart strain, 2D echo has been requested with results pending, will continue on IV heparin for now due to possibility of imminent surgery.  He is not hypoxemic.  We will continue to follow closely.  Peggye Pitt, MD [facility] Pager: [phone]

## 2017-09-22 NOTE — Progress Notes (Signed)
*  PRELIMINARY RESULTS* Echocardiogram 2D Echocardiogram has been performed.  Philip Thompson 09/22/2017, 11:53 AM

## 2017-09-22 NOTE — ED Notes (Signed)
Patient transported to CT 

## 2017-09-22 NOTE — Progress Notes (Signed)
EKG completed and placed on pts chart.  

## 2017-09-22 NOTE — Consult Note (Addendum)
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 descending colon despite repositioning the patient. Gastrografin contrast outlines the normal appearing mucosa of the rectosigmoid and descending colon. Post evacuation x-rays demonstrate significant residual stool throughout the colon. IMPRESSION: 1. Technically successful therapeutic Gastrografin enema. Contrast only reached the proximal descending colon. Significant residual stool post evacuation. Electronically Signed   By: Obie Dredge M.D.   On: 09/18/2017 15:35  [4 week]   Impression: 27 year old gentleman presenting with ongoing abdominal distention/pain, obstipation, colonic distention and now with complaints of hemoptysis.  Work-up revealed bilateral pulmonary emboli.  Patient currently on continuous heparin. Repeat CT imaging performed.  Continued massive distention of the colon throughout with mostly stool, large amount stool in the rectum. Rectum measures 17 cm.  I reviewed films with Dr. Tyron Russell.  Patient cecum measures 9 to 10 cm, sigmoid colon up to 18 cm.  Some degree of venous compression throughout the abdomen particularly common iliac, left common iliac.  Patient has failed  medical management at this point.  I have discussed extensively with Dr. Jena Gauss.  Patient is at increased risk of bowel perforation.  Would recommend surgical consultation for further recommendations and management.  Patient with multiple complicating factors including new bilateral pulmonary emboli on heparin, prior diagnosis of cardiomyopathy in 2015 in the setting of polysubstance  abuse with reported normalization of EF when rechecked later in 2015.  May benefit from cardiology evaluation for input regarding possible neostigmine use.  Plan: 1. Surgery consult.  2. Consider cardiology consult.   We would like to thank you for the opportunity to participate in the care of Tameron L Signor.  Leanna Battles. Dixon Boos [facility] [phone] 9/5/20199:53 AM     LOS: 0 days     Addendum: Discussed with Dr. Lovell Sheehan, who will see patient but patient currently not a surgical candidate in setting of bilateral pulmonary emboli. Plans for ECHO.   Leanna Battles. Dixon Boos [facility] [phone] 9/5/201910:25 AM

## 2017-09-23 ENCOUNTER — Encounter (HOSPITAL_COMMUNITY): Payer: Self-pay | Admitting: Physician Assistant

## 2017-09-23 LAB — CBC
HCT: 37.4 % — ABNORMAL LOW (ref 39.0–52.0)
Hemoglobin: 12.3 g/dL — ABNORMAL LOW (ref 13.0–17.0)
MCH: 27.8 pg (ref 26.0–34.0)
MCHC: 32.9 g/dL (ref 30.0–36.0)
MCV: 84.4 fL (ref 78.0–100.0)
PLATELETS: 136 10*3/uL — AB (ref 150–400)
RBC: 4.43 MIL/uL (ref 4.22–5.81)
RDW: 14.4 % (ref 11.5–15.5)
WBC: 5.9 10*3/uL (ref 4.0–10.5)

## 2017-09-23 LAB — HEPARIN LEVEL (UNFRACTIONATED)
HEPARIN UNFRACTIONATED: 0.53 [IU]/mL (ref 0.30–0.70)
Heparin Unfractionated: 0.14 IU/mL — ABNORMAL LOW (ref 0.30–0.70)
Heparin Unfractionated: 0.33 IU/mL (ref 0.30–0.70)

## 2017-09-23 LAB — BASIC METABOLIC PANEL
Anion gap: 10 (ref 5–15)
BUN: 11 mg/dL (ref 6–20)
CALCIUM: 9.1 mg/dL (ref 8.9–10.3)
CO2: 25 mmol/L (ref 22–32)
CREATININE: 0.64 mg/dL (ref 0.61–1.24)
Chloride: 104 mmol/L (ref 98–111)
GFR calc Af Amer: >60 mL/min (ref >60)
GFR calc non Af Amer: >60 mL/min (ref >60)
GLUCOSE: 91 mg/dL (ref 70–99)
Potassium: 3.4 mmol/L — ABNORMAL LOW (ref 3.5–5.1)
Sodium: 139 mmol/L (ref 135–145)

## 2017-09-23 MED ORDER — HEPARIN BOLUS VIA INFUSION
2500.0000 [IU] | Freq: Once | INTRAVENOUS | Status: AC
  Administered 2017-09-23: 2500 [IU] via INTRAVENOUS
  Filled 2017-09-23: qty 2500

## 2017-09-23 MED ORDER — LEVOTHYROXINE SODIUM 100 MCG IV SOLR
37.5000 ug | Freq: Every day | INTRAVENOUS | Status: DC
  Administered 2017-09-23 – 2017-10-06 (×13): 37.5 ug via INTRAVENOUS
  Filled 2017-09-23 (×14): qty 5

## 2017-09-23 MED ORDER — MORPHINE SULFATE (PF) 2 MG/ML IV SOLN
2.0000 mg | Freq: Once | INTRAVENOUS | Status: AC
  Administered 2017-09-23: 2 mg via INTRAVENOUS
  Filled 2017-09-23: qty 1

## 2017-09-23 MED ORDER — NICOTINE 21 MG/24HR TD PT24
21.0000 mg | MEDICATED_PATCH | Freq: Every day | TRANSDERMAL | Status: DC
  Administered 2017-09-23 – 2017-09-26 (×4): 21 mg via TRANSDERMAL
  Filled 2017-09-23 (×4): qty 1

## 2017-09-23 MED ORDER — LORAZEPAM 2 MG/ML IJ SOLN
0.5000 mg | INTRAMUSCULAR | Status: DC | PRN
  Administered 2017-09-23: 0.5 mg via INTRAVENOUS
  Filled 2017-09-23: qty 1

## 2017-09-23 MED ORDER — SODIUM CHLORIDE 0.9 % IV SOLN
INTRAVENOUS | Status: DC
  Administered 2017-09-23 – 2017-09-24 (×2): via INTRAVENOUS
  Administered 2017-09-24: 1000 mL via INTRAVENOUS
  Administered 2017-09-24 – 2017-09-25 (×2): via INTRAVENOUS
  Administered 2017-09-25: 1000 mL via INTRAVENOUS
  Administered 2017-09-26 – 2017-09-27 (×4): via INTRAVENOUS

## 2017-09-23 MED ORDER — NICOTINE 14 MG/24HR TD PT24: 14.0000 mg | MEDICATED_PATCH | Freq: Every day | TRANSDERMAL | Status: DC

## 2017-09-23 MED ORDER — LORAZEPAM 2 MG/ML IJ SOLN
0.5000 mg | Freq: Once | INTRAMUSCULAR | Status: AC
  Administered 2017-09-23: 0.5 mg via INTRAVENOUS

## 2017-09-23 MED ORDER — LORAZEPAM 2 MG/ML IJ SOLN
1.0000 mg | INTRAMUSCULAR | Status: DC | PRN
  Administered 2017-09-23 – 2017-09-27 (×15): 1 mg via INTRAVENOUS
  Filled 2017-09-23 (×17): qty 1

## 2017-09-23 MED ORDER — NITROGLYCERIN 0.4 MG SL SUBL: 0.4000 mg | SUBLINGUAL_TABLET | SUBLINGUAL | Status: DC | PRN

## 2017-09-23 MED ORDER — NITROGLYCERIN 0.4 MG SL SUBL
SUBLINGUAL_TABLET | SUBLINGUAL | Status: AC
  Administered 2017-09-23: 0.4 mg
  Filled 2017-09-23: qty 1
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
## 2017-09-23 NOTE — Progress Notes (Signed)
ANTICOAGULATION CONSULT NOTE  Pharmacy Consult for heparin Indication: pulmonary embolus  No Known Allergies  Patient Measurements: Height: 5\' 8"  (172.7 cm) Weight: 206 lb 12.7 oz (93.8 kg) IBW/kg (Calculated) : 68.4 Heparin Dosing Weight: 88 kg  Vital Signs: Temp: 97.9 F (36.6 C) (09/06 2250) Temp Source: Oral (09/06 2250) BP: 142/108 (09/06 2120) Pulse Rate: 106 (09/06 2120)  Labs: Recent Labs    09/21/17 2237 09/22/17 0212 09/22/17 0553  09/23/17 0529 09/23/17 1430 09/23/17 2138  HGB 14.8  --   --   --  12.3*  --   --   HCT 43.5  --   --   --  37.4*  --   --   PLT 170  --   --   --  136*  --   --   APTT 37*  --   --   --   --   --   --   LABPROT 14.1  --   --   --   --   --   --   INR 1.10  --   --   --   --   --   --   HEPARINUNFRC  --   --   --    < > 0.14* 0.53 0.33  CREATININE 0.66  --  0.58*  --  0.64  --   --   TROPONINI  --  <0.03  --   --   --   --   --    < > = values in this interval not displayed.    Estimated Creatinine Clearance: 154.2 mL/min (by C-G formula based on SCr of 0.64 mg/dL).   Medical History: Past Medical History:  Diagnosis Date  . ADD (attention deficit disorder)   . Aggression   . Allergic rhinitis   . Anxiety   . Arthritis    joint pain  . Cardiomyopathy (HCC)    a. EF <25% in 2015 during admission for multi substance intoxication/respiratory failure requiring intubation.  . Chronic back pain   . Constipation   . Depression   . Fecal obstruction (HCC)   . Frontal head injury   . Heroin overdose (HCC)   . Obstipation   . Polysubstance abuse (HCC)   . Suicide attempt [facility])     Medications:  Medications Prior to Admission  Medication Sig Dispense Refill Last Dose  . busPIRone (BUSPAR) 15 MG tablet Take 1 tablet (15 mg total) by mouth 3 (three) times daily. 90 tablet 3 09/21/2017 at Unknown time  . citalopram (CELEXA) 40 MG tablet Take 1 tablet (40 mg total) by mouth daily. 90 tablet 0 09/21/2017 at Unknown time  .  cyclobenzaprine (FLEXERIL) 10 MG tablet Take 1 tablet (10 mg total) by mouth 3 (three) times daily as needed for muscle spasms. 30 tablet 0 Past Week at Unknown time  . divalproex (DEPAKOTE) 500 MG DR tablet Take 1 tablet (500 mg total) by mouth every 12 (twelve) hours. For mood stabilization 180 tablet 0 09/21/2017 at Unknown time  . docusate sodium (COLACE) 100 MG capsule Take 1 capsule (100 mg total) by mouth 2 (two) times daily. (Patient taking differently: Take 100 mg by mouth daily. ) 60 capsule 0 09/21/2017 at Unknown time  . furosemide (LASIX) 20 MG tablet Take 1 tablet (20 mg total) by mouth daily. Prn leg swelling (Patient taking differently: Take 20 mg by mouth daily as needed for fluid or edema. As needed for leg swelling) 30 tablet  2 unknown  . gabapentin (NEURONTIN) 300 MG capsule Take 2 capsules (600 mg total) by mouth 3 (three) times daily. For agitation 540 capsule 0 Past Week at Unknown time  . ibuprofen (ADVIL,MOTRIN) 600 MG tablet Take 1 tablet (600 mg total) by mouth every 6 (six) hours as needed for moderate pain. (May buy from over the counter): For pain 1 tablet 0 Past Month at Unknown time  . levothyroxine (SYNTHROID) 50 MCG tablet Take 1 tablet (50 mcg total) by mouth daily before breakfast. 30 tablet 1 09/21/2017 at Unknown time  . linaclotide (LINZESS) 290 MCG CAPS capsule Take 1 capsule (290 mcg total) by mouth daily before breakfast. 30 capsule 1 09/21/2017 at Unknown time  . potassium chloride (K-DUR,KLOR-CON) 10 MEQ tablet Take 1 tablet (10 mEq total) by mouth daily. With furosemide (Patient taking differently: Take 10 mEq by mouth daily as needed (only takes when Lasix (Furosemide)). With furosemide) 30 tablet 2 unknown  . QUEtiapine (SEROQUEL) 400 MG tablet Take 2 tablets (800 mg total) by mouth at bedtime. 180 tablet 0 09/21/2017 at Unknown time  . traMADol (ULTRAM) 50 MG tablet Take 50 mg by mouth every 12 (twelve) hours as needed for moderate pain or severe pain.   0 09/21/2017 at  Unknown time  . traZODone (DESYREL) 100 MG tablet Take 1 tablet (100 mg total) by mouth at bedtime. 90 tablet 0 09/21/2017 at Unknown time    Assessment: 27 yo male seen in the ED for severe abdominal pain and SOB. Radiology results show acute pulmonary embolism. Pharmacy has been consulted for IV heparin dosing.  Heparin level therapeutic at 0.53 Transfer to [facility].  HL now 0.33 units/ml  Goal of Therapy:  Heparin level 0.3-0.7 units/ml Monitor platelets by anticoagulation protocol: Yes   Plan:  Continue heparin infusion at 1800 units/hr Check anti-Xa level daily while on heparin Continue to monitor H&H and platelets   Talbert Cage, PharmD Clinical Pharmacist 09/23/2017 10:54 PM
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
## 2017-09-23 NOTE — Progress Notes (Signed)
NP, [place] notified that patient stated mid CP at a rate of 9/10, EKG taken with ST as the result. States shooting pain to left side.  RR RN notified and 1 nitro tab given per rapid's orders.  Troponins ordered.  After 1 nitro given, pain decreased to 7/10.  No other orders given.  Will continue to monitor patient.

## 2017-09-23 NOTE — Progress Notes (Signed)
PROGRESS NOTE    Philip Thompson  WJX:[id number] DOB: 1990-09-06 DOA: 09/21/2017 PCP: [name], MD    Brief Narrative: 27 year old with past medical history relevant for multiple psychiatric problems including major depression/anxiety, polysubstance abuse, bipolar disorder, hypothyroidism, chronic back pain who initially presented on 09/15/2017 with severe constipation and found to have megacolon/Ogilvie syndrome discharged after enemas and presented again on 09/22/2017 with hemoptysis and found to have persistent megacolon as well as bilateral pulmonary embolism.  He was transferred from [facility] to here for evaluation by cardiology and monitored administration of neostigmine.   Assessment & Plan:   Principal Problem:   Pulmonary embolism, bilateral (HCC) Active Problems:   MDD (major depressive disorder), recurrent severe, without psychosis (HCC)   Leg edema   Fecal impaction (HCC)   Ogilvie's syndrome   Acute respiratory failure with hypoxia (HCC)   Hypothyroidism   Colon distention   #) Ogilvie's syndrome/megacolon: Unclear etiology.  He is mildly hypothyroid likely in the setting of noncompliance.  On review of the chart he is not on chronic or standing and indeed only takes tramadol. -General surgery consult - [facility] consult -Cardiology consult - Subspecialties will discuss possible administration of neostigmine and/or rectal tube -N.p.o. -Hold home linaclotide 290 mcg   #) Bilateral pulmonary embolism: Unclear etiology however concerned that because of massive distention there is sludging of the venous return in the iliacs and IVC causing pulmonary emboli. - Echo on 09/22/2017 shows a normal ejection fraction with low normal RV systolic function -Continue heparin drip -Cardiology consult per above  #) Psychiatric history: Patient has history of bipolar disorder, major depression - Hold home citalopram 40 mg daily -Hold home divalproex 500 mg every 12 -Hold home  quetiapine 800 mg nightly -Hold home trazodone 100 mg nightly -Hold home buspirone 50 mg 3 times daily -Continue PRN IV lorazepam  #) Hypothyroidsim: Last TSH was mildly elevated. - Hold home levothyroxine 50 mg -Start levothyroxine 37.5 mcg daily IV  #) Chronic back pain: -Hold home gabapentin 600 mg 3 times daily - Hold home cyclobenzaprine 10 mg 3 times daily as needed  Fluids: Gentle IV fluids Electrolytes: Monitor and supplement Nutrition: N.p.o.  Prophylaxis: Heparin drip  Disposition: Pending subspecialty evaluation and consideration of surgical treatment versus medical treatment  Full code   Consultants:   Gastroenterology, equal  Cardiology  General surgery  Procedures:   Echo 09/22/2017: Normal EF, low normal RV function  Antimicrobials:  None   Subjective: Patient reports he is in significant pain.  He denies any nausea, vomiting, diarrhea.  He has not had any flatus.  Objective: Vitals:   09/22/17 2139 09/23/17 0641 09/23/17 1407 09/23/17 1728  BP: (!) 140/101 (!) 145/94 (!) 130/98 (!) 136/99  Pulse: (!) 108 (!) 105 (!) 115 (!) 107  Resp: 20 20 20  (!) 25  Temp: (!) 97.3 F (36.3 C) 98.1 F (36.7 C) 98.4 F (36.9 C) 97.6 F (36.4 C)  TempSrc: Oral Oral  Oral  SpO2: 98% 93% 99% 100%  Weight:      Height:        Intake/Output Summary (Last 24 hours) at 09/23/2017 1741 Last data filed at 09/23/2017 0900 Gross per 24 hour  Intake 354 ml  Output -  Net 354 ml   Filed Weights   09/22/17 0128  Weight: 93.8 kg    Examination:  General exam: Alert, awake, oriented x 3 Respiratory system: Clear to auscultation. Respiratory effort normal. Cardiovascular system:RRR. No murmurs, rubs, gallops.  Gastrointestinal system: Abdomen is massively distended, nontender to palpation.  Hypoactive bowel sounds  Central nervous system: Alert and oriented. No focal neurological deficits. Extremities: No lower extremity edema Skin: No rashes visible  skin Psychiatry: Judgement and insight appear normal. Mood & affect appropriate.    Data Reviewed: I have personally reviewed following labs and imaging studies  CBC: Recent Labs  Lab 09/17/17 0540 09/18/17 0439 09/19/17 0501 09/21/17 2237 09/23/17 0529  WBC 8.0 7.6 10.1 12.3* 5.9  HGB 12.5* 12.0* 12.3* 14.8 12.3*  HCT 37.7* 37.0* 37.7* 43.5 37.4*  MCV 84.9 84.9 84.5 84.1 84.4  PLT 141* 129* 136* 170 136*   Basic Metabolic Panel: Recent Labs  Lab 09/18/17 0439 09/19/17 0501 09/21/17 2237 09/22/17 0553 09/23/17 0529  NA 139 142 139 139 139  K 4.1 4.9 3.7 4.0 3.4*  CL 111 111 103 105 104  CO2 23 25 27 27 25   GLUCOSE 96 95 105* 111* 91  BUN 8 9 10 10 11   CREATININE 0.79 0.71 0.66 0.58* 0.64  CALCIUM 8.9 9.2 9.9 9.0 9.1  MG 1.8 2.0 1.9  --   --    GFR: Estimated Creatinine Clearance: 154.2 mL/min (by C-G formula based on SCr of 0.64 mg/dL). Liver Function Tests: Recent Labs  Lab 09/21/17 2237  AST 18  ALT 13  ALKPHOS 92  BILITOT 0.8  PROT 8.2*  ALBUMIN 4.3   Recent Labs  Lab 09/21/17 2237  LIPASE 37   No results for input(s): AMMONIA in the last 168 hours. Coagulation Profile: Recent Labs  Lab 09/18/17 0953 09/21/17 2237  INR 1.09 1.10   Cardiac Enzymes: Recent Labs  Lab 09/22/17 0212  TROPONINI <0.03   BNP (last 3 results) No results for input(s): PROBNP in the last 8760 hours. HbA1C: No results for input(s): HGBA1C in the last 72 hours. CBG: No results for input(s): GLUCAP in the last 168 hours. Lipid Profile: No results for input(s): CHOL, HDL, LDLCALC, TRIG, CHOLHDL, LDLDIRECT in the last 72 hours. Thyroid Function Tests: No results for input(s): TSH, T4TOTAL, FREET4, T3FREE, THYROIDAB in the last 72 hours. Anemia Panel: No results for input(s): VITAMINB12, FOLATE, FERRITIN, TIBC, IRON, RETICCTPCT in the last 72 hours. Sepsis Labs: Recent Labs  Lab 09/21/17 2304  LATICACIDVEN 1.06    Recent Results (from the past 240 hour(s))   MRSA PCR Screening     Status: None   Collection Time: 09/15/17 10:44 PM  Result Value Ref Range Status   MRSA by PCR NEGATIVE NEGATIVE Final    Comment:        The GeneXpert MRSA Assay (FDA approved for NASAL specimens only), is one component of a comprehensive MRSA colonization surveillance program. It is not intended to diagnose MRSA infection nor to guide or monitor treatment for MRSA infections. Performed at [facility], [address] [place], [place] [address]          Radiology Studies: Dg Abdomen 1 View  Result Date: 09/21/2017 CLINICAL DATA:  Megacolon with little bowel movement while in hospital. EXAM: ABDOMEN - 1 VIEW COMPARISON:  Gastrografin enema images from 09/18/2017 and CT from 09/15/2017 FINDINGS: Significant stool retention throughout the visualized colon compatible with severe constipation and obstipation. Free air identified. No acute osseous appearing abnormality limited by overlying stool filled bowel. IMPRESSION: Significant stool retention throughout the colon. No significant change. Electronically Signed   By: Tollie Eth M.D.   On: 09/21/2017 23:22   Ct Angio Chest Pe W Or Wo Contrast  Result Date: 09/22/2017 CLINICAL DATA:  Recent admission for megacolon. Now unable to eat solids with severely distended abdomen. Shortness of breath. EXAM: CT ANGIOGRAPHY CHEST CT ABDOMEN AND PELVIS WITH CONTRAST TECHNIQUE: Multidetector CT imaging of the chest was performed using the standard protocol during bolus administration of intravenous contrast. Multiplanar CT image reconstructions and MIPs were obtained to evaluate the vascular anatomy. Multidetector CT imaging of the abdomen and pelvis was performed using the standard protocol during bolus administration of intravenous contrast. CONTRAST:  ISOVUE-370 IOPAMIDOL (ISOVUE-370) INJECTION 76% COMPARISON:  CT abdomen and pelvis 09/15/2017. FINDINGS: CTA CHEST FINDINGS Cardiovascular: Motion artifact limits  examination. There is good opacification of the central and segmental pulmonary arteries. Filling defects are demonstrated in the distal main and lower lobe pulmonary arteries bilaterally consistent with bilateral pulmonary emboli. Normal caliber thoracic aorta. Great vessel origins are patent. Normal heart size. Small pericardial effusion. Normal RV to LV ratio at 0.8. Mediastinum/Nodes: No significant lymphadenopathy. Esophagus is decompressed. Lungs/Pleura: Emphysematous changes and fibrosis in both lungs. Fluid or thickened pleura in the fissures. Patchy areas of infiltration or consolidation in both lungs could represent pneumonia or infarcts. No pleural effusions. No pneumothorax. Airways are patent. Musculoskeletal: No chest wall abnormality. No acute or significant osseous findings. Review of the MIP images confirms the above findings. CT ABDOMEN and PELVIS FINDINGS Hepatobiliary: No focal liver lesions. Gallbladder is mildly dilated with probable sludge. No wall thickening. Bile ducts are not dilated. Pancreas: Unremarkable. No pancreatic ductal dilatation or surrounding inflammatory changes. Spleen: Normal in size without focal abnormality. Adrenals/Urinary Tract: Adrenal glands are unremarkable. Kidneys are normal, without renal calculi, focal lesion, or hydronephrosis. Bladder is thick-walled, suggesting cystitis. Stomach/Bowel: There is massive distention of the colon throughout. The colon is filled with gas and stool. Large amount of stool in the rectum. Rectal diameter measures up to 17 cm. Rectal wall thickening may indicate stercoral colitis. Small bowel and stomach are decompressed. Vascular/Lymphatic: No significant vascular findings are present. No enlarged abdominal or pelvic lymph nodes. Prominent bilateral groin lymph nodes of nonspecific etiology, probably reactive. Reproductive: Prostate is unremarkable. Other: No free air or free fluid in the abdomen. Edema in the subcutaneous fat.  Musculoskeletal: No acute or significant osseous findings. Review of the MIP images confirms the above findings. IMPRESSION: 1. Positive examination for bilateral pulmonary emboli. No evidence of right heart strain. 2. Diffuse emphysematous changes in the lungs. Patchy airspace disease may represent pneumonia or infarcts. 3. Massive distention of the colon throughout. Colon is filled with gas and mostly stool. Large amount of stool in the rectum with rectal wall thickening may indicate stercoral colitis. Emphysema (ICD10-J43.9). These results were called by telephone at the time of interpretation on 09/22/2017 at 1:07 am to Dr. Cheron Schaumann , who verbally acknowledged these results. Electronically Signed   By: Burman Nieves M.D.   On: 09/22/2017 01:07   Ct Abdomen Pelvis W Contrast  Result Date: 09/22/2017 CLINICAL DATA:  Recent admission for megacolon. Now unable to eat solids with severely distended abdomen. Shortness of breath. EXAM: CT ANGIOGRAPHY CHEST CT ABDOMEN AND PELVIS WITH CONTRAST TECHNIQUE: Multidetector CT imaging of the chest was performed using the standard protocol during bolus administration of intravenous contrast. Multiplanar CT image reconstructions and MIPs were obtained to evaluate the vascular anatomy. Multidetector CT imaging of the abdomen and pelvis was performed using the standard protocol during bolus administration of intravenous contrast. CONTRAST:  ISOVUE-370 IOPAMIDOL (ISOVUE-370) INJECTION 76% COMPARISON:  CT abdomen and pelvis 09/15/2017.  FINDINGS: CTA CHEST FINDINGS Cardiovascular: Motion artifact limits examination. There is good opacification of the central and segmental pulmonary arteries. Filling defects are demonstrated in the distal main and lower lobe pulmonary arteries bilaterally consistent with bilateral pulmonary emboli. Normal caliber thoracic aorta. Great vessel origins are patent. Normal heart size. Small pericardial effusion. Normal RV to LV ratio at 0.8.  Mediastinum/Nodes: No significant lymphadenopathy. Esophagus is decompressed. Lungs/Pleura: Emphysematous changes and fibrosis in both lungs. Fluid or thickened pleura in the fissures. Patchy areas of infiltration or consolidation in both lungs could represent pneumonia or infarcts. No pleural effusions. No pneumothorax. Airways are patent. Musculoskeletal: No chest wall abnormality. No acute or significant osseous findings. Review of the MIP images confirms the above findings. CT ABDOMEN and PELVIS FINDINGS Hepatobiliary: No focal liver lesions. Gallbladder is mildly dilated with probable sludge. No wall thickening. Bile ducts are not dilated. Pancreas: Unremarkable. No pancreatic ductal dilatation or surrounding inflammatory changes. Spleen: Normal in size without focal abnormality. Adrenals/Urinary Tract: Adrenal glands are unremarkable. Kidneys are normal, without renal calculi, focal lesion, or hydronephrosis. Bladder is thick-walled, suggesting cystitis. Stomach/Bowel: There is massive distention of the colon throughout. The colon is filled with gas and stool. Large amount of stool in the rectum. Rectal diameter measures up to 17 cm. Rectal wall thickening may indicate stercoral colitis. Small bowel and stomach are decompressed. Vascular/Lymphatic: No significant vascular findings are present. No enlarged abdominal or pelvic lymph nodes. Prominent bilateral groin lymph nodes of nonspecific etiology, probably reactive. Reproductive: Prostate is unremarkable. Other: No free air or free fluid in the abdomen. Edema in the subcutaneous fat. Musculoskeletal: No acute or significant osseous findings. Review of the MIP images confirms the above findings. IMPRESSION: 1. Positive examination for bilateral pulmonary emboli. No evidence of right heart strain. 2. Diffuse emphysematous changes in the lungs. Patchy airspace disease may represent pneumonia or infarcts. 3. Massive distention of the colon throughout. Colon is  filled with gas and mostly stool. Large amount of stool in the rectum with rectal wall thickening may indicate stercoral colitis. Emphysema (ICD10-J43.9). These results were called by telephone at the time of interpretation on 09/22/2017 at 1:07 am to Dr. Cheron Schaumann , who verbally acknowledged these results. Electronically Signed   By: Burman Nieves M.D.   On: 09/22/2017 01:07   Dg Chest Portable 1 View  Result Date: 09/21/2017 CLINICAL DATA:  Shortness of breath EXAM: PORTABLE CHEST 1 VIEW COMPARISON:  08/18/2017 08/27/2013 FINDINGS: Low lung volumes. Subsegmental atelectasis left base. Increasing opacity at the right base. Stable cardiomediastinal silhouette. No pneumothorax. IMPRESSION: Low lung volumes with atelectasis at the left base. Increasing airspace disease at the right base, atelectasis versus mild infiltrate. Electronically Signed   By: Jasmine Pang M.D.   On: 09/21/2017 23:28        Scheduled Meds: . milk and molasses  1 enema Rectal Once  . nicotine  21 mg Transdermal Daily  . sodium chloride flush  3 mL Intravenous Q12H   Continuous Infusions: . heparin 1,800 Units/hr (09/23/17 0827)     LOS: 1 day    Time spent: 35    Delaine Lame, MD [facility]  If 7PM-7AM, please contact night-coverage [email or web address] Password [email or web address] 09/23/2017, 5:41 PM

## 2017-09-23 NOTE — Progress Notes (Signed)
PROGRESS NOTE    Philip Thompson  JWJ:[id number] DOB: [birth year] DOA: 09/21/2017 PCP: [name], MD     Brief Narrative:  27 year old young man admitted from home on 9/4 due to cough with hemoptysis as well as continued abdominal pain and distention.  He has a history of Ogilvie's and massive colonic distention with recent hospitalization during which she was somewhat decompressed with a Gastrografin enema and was discharged home.  Due to hemoptysis CT scan was performed that confirmed bilateral PE and admission was requested.   Assessment & Plan:   Principal Problem:   Pulmonary embolism, bilateral (HCC) Active Problems:   MDD (major depressive disorder), recurrent severe, without psychosis (HCC)   Leg edema   Fecal impaction (HCC)   Ogilvie's syndrome   Acute respiratory failure with hypoxia (HCC)   Hypothyroidism   Colon distention   Bilateral pulmonary embolism -Possibly due to impediment of venous return due to massively distended colon. -Continue IV heparin for now, at some point will need to be transition to oral anticoagulants after we have determined whether surgery might be necessary. -2D echo with ejection fraction of 55 to 60%, normal wall thickness, trivial pericardial effusion.  Right ventricle size is normal, thickness is normal systolic function was low normal.  Massive colonic distention, question Ogilvie's with concern for impending colonic perforation -At this point he has failed most medical management.  After further discussion with GI it is felt that a trial of neostigmine for colonic decompression is the last step prior to surgery. -He has been seen in consultation by both surgery and GI at [facility], GI has requested cardiology to opine on safety of neostigmine in this patient with an acute PE and prior history of cardiomyopathy. -Cardiology is concerned that administration of neostigmine might cause significant bradycardia or ventricular  arrhythmias that might hemodynamically compromise him in the face of his recent PE and low normal right ventricular systolic function.  Furthermore his massive colonic distention is thought to be compressing the inferior vena cava and possibly decreasing venous return. -After gathering opinions from all consultants in the case, it is felt that this patient would best be served by transferring to [facility] for administration of neostigmine there where he could be closely followed by critical care and cardiology if need be. Also he is considered a high risk candidate for surgery and it would be risky to perform surgery here at [facility] if he fails attempted neostigmine stimulation of the bowel given limited critical care services.  Depression/anxiety -PRN IV Ativan for now, consider restarting oral meds including Celexa, Depakote, trazodone, Seroquel and BuSpar once tolerating the p.o. route.   DVT prophylaxis: IV heparin Code Status: Full code Family Communication: Mother at bedside updated on plan of care and all questions answered Disposition Plan: Transfer to [facility] for attempt at neostigmine stimulation of the bowel, if fails will need surgery.  Consultants:   GI  General surgery  Cardiology  Procedures:   None  Antimicrobials:  Anti-infectives (From admission, onward)   None       Subjective: Sitting up at bedside, complains of significant abdominal distention, had very small bowel movement earlier today.  Objective: Vitals:   09/22/17 2024 09/22/17 2139 09/23/17 0641 09/23/17 1407  BP:  (!) 140/101 (!) 145/94 (!) 130/98  Pulse:  (!) 108 (!) 105 (!) 115  Resp:  20 20 20   Temp:  (!) 97.3 F (36.3 C) 98.1 F (36.7 C) 98.4  F (36.9 C)  TempSrc:  Oral Oral   SpO2: 93% 98% 93% 99%  Weight:      Height:        Intake/Output Summary (Last 24 hours) at 09/23/2017 1559 Last data filed at 09/23/2017 0900 Gross per 24 hour  Intake 354 ml    Output -  Net 354 ml   Filed Weights   09/22/17 0128  Weight: 93.8 kg    Examination:  General exam: Alert, awake, oriented x 3 Respiratory system: Clear to auscultation. Respiratory effort normal. Cardiovascular system:RRR. No murmurs, rubs, gallops. Gastrointestinal system: Abdomen is massively distended, nontender to palpation.  Hypoactive bowel sounds  Central nervous system: Alert and oriented. No focal neurological deficits. Extremities: No C/C/E, +pedal pulses Skin: No rashes, lesions or ulcers Psychiatry: Judgement and insight appear normal. Mood & affect appropriate.     Data Reviewed: I have personally reviewed following labs and imaging studies  CBC: Recent Labs  Lab 09/17/17 0540 09/18/17 0439 09/19/17 0501 09/21/17 2237 09/23/17 0529  WBC 8.0 7.6 10.1 12.3* 5.9  HGB 12.5* 12.0* 12.3* 14.8 12.3*  HCT 37.7* 37.0* 37.7* 43.5 37.4*  MCV 84.9 84.9 84.5 84.1 84.4  PLT 141* 129* 136* 170 136*   Basic Metabolic Panel: Recent Labs  Lab 09/18/17 0439 09/19/17 0501 09/21/17 2237 09/22/17 0553 09/23/17 0529  NA 139 142 139 139 139  K 4.1 4.9 3.7 4.0 3.4*  CL 111 111 103 105 104  CO2 23 25 27 27 25   GLUCOSE 96 95 105* 111* 91  BUN 8 9 10 10 11   CREATININE 0.79 0.71 0.66 0.58* 0.64  CALCIUM 8.9 9.2 9.9 9.0 9.1  MG 1.8 2.0 1.9  --   --    GFR: Estimated Creatinine Clearance: 154.2 mL/min (by C-G formula based on SCr of 0.64 mg/dL). Liver Function Tests: Recent Labs  Lab 09/21/17 2237  AST 18  ALT 13  ALKPHOS 92  BILITOT 0.8  PROT 8.2*  ALBUMIN 4.3   Recent Labs  Lab 09/21/17 2237  LIPASE 37   No results for input(s): AMMONIA in the last 168 hours. Coagulation Profile: Recent Labs  Lab 09/18/17 0953 09/21/17 2237  INR 1.09 1.10   Cardiac Enzymes: Recent Labs  Lab 09/22/17 0212  TROPONINI <0.03   BNP (last 3 results) No results for input(s): PROBNP in the last 8760 hours. HbA1C: No results for input(s): HGBA1C in the last 72  hours. CBG: No results for input(s): GLUCAP in the last 168 hours. Lipid Profile: No results for input(s): CHOL, HDL, LDLCALC, TRIG, CHOLHDL, LDLDIRECT in the last 72 hours. Thyroid Function Tests: No results for input(s): TSH, T4TOTAL, FREET4, T3FREE, THYROIDAB in the last 72 hours. Anemia Panel: No results for input(s): VITAMINB12, FOLATE, FERRITIN, TIBC, IRON, RETICCTPCT in the last 72 hours. Urine analysis:    Component Value Date/Time   COLORURINE STRAW (A) 09/12/2016 0326   APPEARANCEUR CLEAR 09/12/2016 0326   LABSPEC 1.005 09/12/2016 0326   PHURINE 7.0 09/12/2016 0326   GLUCOSEU NEGATIVE 09/12/2016 0326   HGBUR NEGATIVE 09/12/2016 0326   BILIRUBINUR NEGATIVE 09/12/2016 0326   KETONESUR NEGATIVE 09/12/2016 0326   PROTEINUR NEGATIVE 09/12/2016 0326   UROBILINOGEN 0.2 08/27/2013 2203   NITRITE NEGATIVE 09/12/2016 0326   LEUKOCYTESUR NEGATIVE 09/12/2016 0326   Sepsis Labs: @LABRCNTIP (procalcitonin:4,lacticidven:4)  ) Recent Results (from the past 240 hour(s))  MRSA PCR Screening     Status: None   Collection Time: 09/15/17 10:44 PM  Result Value Ref Range Status  MRSA by PCR NEGATIVE NEGATIVE Final    Comment:        The GeneXpert MRSA Assay (FDA approved for NASAL specimens only), is one component of a comprehensive MRSA colonization surveillance program. It is not intended to diagnose MRSA infection nor to guide or monitor treatment for MRSA infections. Performed at [facility], [address] [place], [place] [address]          Radiology Studies: Dg Abdomen 1 View  Result Date: 09/21/2017 CLINICAL DATA:  Megacolon with little bowel movement while in hospital. EXAM: ABDOMEN - 1 VIEW COMPARISON:  Gastrografin enema images from 09/18/2017 and CT from 09/15/2017 FINDINGS: Significant stool retention throughout the visualized colon compatible with severe constipation and obstipation. Free air identified. No acute osseous appearing abnormality limited by  overlying stool filled bowel. IMPRESSION: Significant stool retention throughout the colon. No significant change. Electronically Signed   By: Tollie Eth M.D.   On: 09/21/2017 23:22   Ct Angio Chest Pe W Or Wo Contrast  Result Date: 09/22/2017 CLINICAL DATA:  Recent admission for megacolon. Now unable to eat solids with severely distended abdomen. Shortness of breath. EXAM: CT ANGIOGRAPHY CHEST CT ABDOMEN AND PELVIS WITH CONTRAST TECHNIQUE: Multidetector CT imaging of the chest was performed using the standard protocol during bolus administration of intravenous contrast. Multiplanar CT image reconstructions and MIPs were obtained to evaluate the vascular anatomy. Multidetector CT imaging of the abdomen and pelvis was performed using the standard protocol during bolus administration of intravenous contrast. CONTRAST:  ISOVUE-370 IOPAMIDOL (ISOVUE-370) INJECTION 76% COMPARISON:  CT abdomen and pelvis 09/15/2017. FINDINGS: CTA CHEST FINDINGS Cardiovascular: Motion artifact limits examination. There is good opacification of the central and segmental pulmonary arteries. Filling defects are demonstrated in the distal main and lower lobe pulmonary arteries bilaterally consistent with bilateral pulmonary emboli. Normal caliber thoracic aorta. Great vessel origins are patent. Normal heart size. Small pericardial effusion. Normal RV to LV ratio at 0.8. Mediastinum/Nodes: No significant lymphadenopathy. Esophagus is decompressed. Lungs/Pleura: Emphysematous changes and fibrosis in both lungs. Fluid or thickened pleura in the fissures. Patchy areas of infiltration or consolidation in both lungs could represent pneumonia or infarcts. No pleural effusions. No pneumothorax. Airways are patent. Musculoskeletal: No chest wall abnormality. No acute or significant osseous findings. Review of the MIP images confirms the above findings. CT ABDOMEN and PELVIS FINDINGS Hepatobiliary: No focal liver lesions. Gallbladder is mildly  dilated with probable sludge. No wall thickening. Bile ducts are not dilated. Pancreas: Unremarkable. No pancreatic ductal dilatation or surrounding inflammatory changes. Spleen: Normal in size without focal abnormality. Adrenals/Urinary Tract: Adrenal glands are unremarkable. Kidneys are normal, without renal calculi, focal lesion, or hydronephrosis. Bladder is thick-walled, suggesting cystitis. Stomach/Bowel: There is massive distention of the colon throughout. The colon is filled with gas and stool. Large amount of stool in the rectum. Rectal diameter measures up to 17 cm. Rectal wall thickening may indicate stercoral colitis. Small bowel and stomach are decompressed. Vascular/Lymphatic: No significant vascular findings are present. No enlarged abdominal or pelvic lymph nodes. Prominent bilateral groin lymph nodes of nonspecific etiology, probably reactive. Reproductive: Prostate is unremarkable. Other: No free air or free fluid in the abdomen. Edema in the subcutaneous fat. Musculoskeletal: No acute or significant osseous findings. Review of the MIP images confirms the above findings. IMPRESSION: 1. Positive examination for bilateral pulmonary emboli. No evidence of right heart strain. 2. Diffuse emphysematous changes in the lungs. Patchy airspace disease may represent pneumonia or infarcts. 3. Massive distention of the  colon throughout. Colon is filled with gas and mostly stool. Large amount of stool in the rectum with rectal wall thickening may indicate stercoral colitis. Emphysema (ICD10-J43.9). These results were called by telephone at the time of interpretation on 09/22/2017 at 1:07 am to Dr. Cheron Schaumann , who verbally acknowledged these results. Electronically Signed   By: Burman Nieves M.D.   On: 09/22/2017 01:07   Ct Abdomen Pelvis W Contrast  Result Date: 09/22/2017 CLINICAL DATA:  Recent admission for megacolon. Now unable to eat solids with severely distended abdomen. Shortness of breath. EXAM:  CT ANGIOGRAPHY CHEST CT ABDOMEN AND PELVIS WITH CONTRAST TECHNIQUE: Multidetector CT imaging of the chest was performed using the standard protocol during bolus administration of intravenous contrast. Multiplanar CT image reconstructions and MIPs were obtained to evaluate the vascular anatomy. Multidetector CT imaging of the abdomen and pelvis was performed using the standard protocol during bolus administration of intravenous contrast. CONTRAST:  ISOVUE-370 IOPAMIDOL (ISOVUE-370) INJECTION 76% COMPARISON:  CT abdomen and pelvis 09/15/2017. FINDINGS: CTA CHEST FINDINGS Cardiovascular: Motion artifact limits examination. There is good opacification of the central and segmental pulmonary arteries. Filling defects are demonstrated in the distal main and lower lobe pulmonary arteries bilaterally consistent with bilateral pulmonary emboli. Normal caliber thoracic aorta. Great vessel origins are patent. Normal heart size. Small pericardial effusion. Normal RV to LV ratio at 0.8. Mediastinum/Nodes: No significant lymphadenopathy. Esophagus is decompressed. Lungs/Pleura: Emphysematous changes and fibrosis in both lungs. Fluid or thickened pleura in the fissures. Patchy areas of infiltration or consolidation in both lungs could represent pneumonia or infarcts. No pleural effusions. No pneumothorax. Airways are patent. Musculoskeletal: No chest wall abnormality. No acute or significant osseous findings. Review of the MIP images confirms the above findings. CT ABDOMEN and PELVIS FINDINGS Hepatobiliary: No focal liver lesions. Gallbladder is mildly dilated with probable sludge. No wall thickening. Bile ducts are not dilated. Pancreas: Unremarkable. No pancreatic ductal dilatation or surrounding inflammatory changes. Spleen: Normal in size without focal abnormality. Adrenals/Urinary Tract: Adrenal glands are unremarkable. Kidneys are normal, without renal calculi, focal lesion, or hydronephrosis. Bladder is thick-walled,  suggesting cystitis. Stomach/Bowel: There is massive distention of the colon throughout. The colon is filled with gas and stool. Large amount of stool in the rectum. Rectal diameter measures up to 17 cm. Rectal wall thickening may indicate stercoral colitis. Small bowel and stomach are decompressed. Vascular/Lymphatic: No significant vascular findings are present. No enlarged abdominal or pelvic lymph nodes. Prominent bilateral groin lymph nodes of nonspecific etiology, probably reactive. Reproductive: Prostate is unremarkable. Other: No free air or free fluid in the abdomen. Edema in the subcutaneous fat. Musculoskeletal: No acute or significant osseous findings. Review of the MIP images confirms the above findings. IMPRESSION: 1. Positive examination for bilateral pulmonary emboli. No evidence of right heart strain. 2. Diffuse emphysematous changes in the lungs. Patchy airspace disease may represent pneumonia or infarcts. 3. Massive distention of the colon throughout. Colon is filled with gas and mostly stool. Large amount of stool in the rectum with rectal wall thickening may indicate stercoral colitis. Emphysema (ICD10-J43.9). These results were called by telephone at the time of interpretation on 09/22/2017 at 1:07 am to Dr. Cheron Schaumann , who verbally acknowledged these results. Electronically Signed   By: Burman Nieves M.D.   On: 09/22/2017 01:07   Dg Chest Portable 1 View  Result Date: 09/21/2017 CLINICAL DATA:  Shortness of breath EXAM: PORTABLE CHEST 1 VIEW COMPARISON:  08/18/2017 08/27/2013 FINDINGS: Low lung volumes. Subsegmental atelectasis  left base. Increasing opacity at the right base. Stable cardiomediastinal silhouette. No pneumothorax. IMPRESSION: Low lung volumes with atelectasis at the left base. Increasing airspace disease at the right base, atelectasis versus mild infiltrate. Electronically Signed   By: Jasmine Pang M.D.   On: 09/21/2017 23:28        Scheduled Meds: . LORazepam   0.5 mg Intravenous Once  . milk and molasses  1 enema Rectal Once  . nicotine  21 mg Transdermal Daily  . sodium chloride flush  3 mL Intravenous Q12H   Continuous Infusions: . heparin 1,800 Units/hr (09/23/17 0827)     LOS: 1 day    Time spent: 35 minutes. Greater than 50% of this time was spent in direct contact with the patient and with patient's mother, coordinating care and discussing relevant ongoing clinical issues, including need for decompression of the bowel, plan to attempt neostigmine as a last ditch effort prior to surgery that would likely consist of colectomy and ileostomy, need to transfer to [facility] given limited critical care services at [facility] during the weekend.Chaya Jan, MD [facility] Pager [phone]  If 7PM-7AM, please contact night-coverage [email or web address] Password [email or web address] 09/23/2017, 3:59 PM

## 2017-09-23 NOTE — Progress Notes (Signed)
ANTICOAGULATION CONSULT NOTE  Pharmacy Consult for heparin Indication: pulmonary embolus  No Known Allergies  Patient Measurements: Height: 5\' 8"  (172.7 cm) Weight: 206 lb 12.7 oz (93.8 kg) IBW/kg (Calculated) : 68.4 Heparin Dosing Weight: 88 kg  Vital Signs: Temp: 98.4 F (36.9 C) (09/06 1407) Temp Source: Oral (09/06 0641) BP: 130/98 (09/06 1407) Pulse Rate: 115 (09/06 1407)  Labs: Recent Labs    09/21/17 2237 09/22/17 0212 09/22/17 0553 09/22/17 1025 09/23/17 0529 09/23/17 1430  HGB 14.8  --   --   --  12.3*  --   HCT 43.5  --   --   --  37.4*  --   PLT 170  --   --   --  136*  --   APTT 37*  --   --   --   --   --   LABPROT 14.1  --   --   --   --   --   INR 1.10  --   --   --   --   --   HEPARINUNFRC  --   --   --  0.33 0.14* 0.53  CREATININE 0.66  --  0.58*  --  0.64  --   TROPONINI  --  <0.03  --   --   --   --     Estimated Creatinine Clearance: 154.2 mL/min (by C-G formula based on SCr of 0.64 mg/dL).   Medical History: Past Medical History:  Diagnosis Date  . ADD (attention deficit disorder)   . Aggression   . Allergic rhinitis   . Anxiety   . Arthritis    joint pain  . Cardiomyopathy (HCC)    a. EF <25% in 2015 during admission for multi substance intoxication/respiratory failure requiring intubation.  . Chronic back pain   . Constipation   . Depression   . Fecal obstruction (HCC)   . Frontal head injury   . Heroin overdose (HCC)   . Obstipation   . Polysubstance abuse (HCC)   . Suicide attempt [facility])     Medications:  Medications Prior to Admission  Medication Sig Dispense Refill Last Dose  . busPIRone (BUSPAR) 15 MG tablet Take 1 tablet (15 mg total) by mouth 3 (three) times daily. 90 tablet 3 09/21/2017 at Unknown time  . citalopram (CELEXA) 40 MG tablet Take 1 tablet (40 mg total) by mouth daily. 90 tablet 0 09/21/2017 at Unknown time  . cyclobenzaprine (FLEXERIL) 10 MG tablet Take 1 tablet (10 mg total) by mouth 3 (three) times daily as  needed for muscle spasms. 30 tablet 0 Past Week at Unknown time  . divalproex (DEPAKOTE) 500 MG DR tablet Take 1 tablet (500 mg total) by mouth every 12 (twelve) hours. For mood stabilization 180 tablet 0 09/21/2017 at Unknown time  . docusate sodium (COLACE) 100 MG capsule Take 1 capsule (100 mg total) by mouth 2 (two) times daily. (Patient taking differently: Take 100 mg by mouth daily. ) 60 capsule 0 09/21/2017 at Unknown time  . furosemide (LASIX) 20 MG tablet Take 1 tablet (20 mg total) by mouth daily. Prn leg swelling (Patient taking differently: Take 20 mg by mouth daily as needed for fluid or edema. As needed for leg swelling) 30 tablet 2 unknown  . gabapentin (NEURONTIN) 300 MG capsule Take 2 capsules (600 mg total) by mouth 3 (three) times daily. For agitation 540 capsule 0 Past Week at Unknown time  . ibuprofen (ADVIL,MOTRIN) 600 MG tablet Take 1  tablet (600 mg total) by mouth every 6 (six) hours as needed for moderate pain. (May buy from over the counter): For pain 1 tablet 0 Past Month at Unknown time  . levothyroxine (SYNTHROID) 50 MCG tablet Take 1 tablet (50 mcg total) by mouth daily before breakfast. 30 tablet 1 09/21/2017 at Unknown time  . linaclotide (LINZESS) 290 MCG CAPS capsule Take 1 capsule (290 mcg total) by mouth daily before breakfast. 30 capsule 1 09/21/2017 at Unknown time  . potassium chloride (K-DUR,KLOR-CON) 10 MEQ tablet Take 1 tablet (10 mEq total) by mouth daily. With furosemide (Patient taking differently: Take 10 mEq by mouth daily as needed (only takes when Lasix (Furosemide)). With furosemide) 30 tablet 2 unknown  . QUEtiapine (SEROQUEL) 400 MG tablet Take 2 tablets (800 mg total) by mouth at bedtime. 180 tablet 0 09/21/2017 at Unknown time  . traMADol (ULTRAM) 50 MG tablet Take 50 mg by mouth every 12 (twelve) hours as needed for moderate pain or severe pain.   0 09/21/2017 at Unknown time  . traZODone (DESYREL) 100 MG tablet Take 1 tablet (100 mg total) by mouth at bedtime.  90 tablet 0 09/21/2017 at Unknown time    Assessment: 27 yo male seen in the ED for severe abdominal pain and SOB. Radiology results show acute pulmonary embolism. Pharmacy has been consulted for IV heparin dosing.  Heparin level therapeutic at 0.53  Goal of Therapy:  Heparin level 0.3-0.7 units/ml Monitor platelets by anticoagulation protocol: Yes   Plan:  Continue heparin infusion at 1800 units/hr Check anti-Xa level daily while on heparin Continue to monitor H&H and platelets   Judeth Cornfield, PharmD Clinical Pharmacist 09/23/2017 2:55 PM

## 2017-09-23 NOTE — Progress Notes (Addendum)
Pt asking for ativan, food, ice and water. Pt currently sitting on bed, talking on cell phone. Placed pt on stepdown monitor.   Pt educated on need for NPO status and reasons for maintaining NPO status. Pt educated several times as he continued to ask for food and water. [name] mother at bedside.

## 2017-09-23 NOTE — Significant Event (Signed)
Rapid Response Event Note  Overview:Pt here with PE and Ogilvie syndrome.  Pt c/o of 9/10 chest pain. Time Called: 2247 Arrival Time: 2250 Event Type: Cardiac  Initial Focused Assessment: Pt laying in bed, appears uncomfortable. T-98.1, HR-135 (ST), BP-139/96, RR-25 with accessory muscle use, SpO2-98% on 2L.  Lungs clear t/o.  Abd large and distended. Pt having difficulty finding a comfortable position d/t his distended abd. Pt c/o chest pain that is  radiating to his L side and mild SOB.  Interventions: EKG-ST NTG SL X 1(Chest pain reduced from 9/10 to 7/10) Troponin X 1 Toradol X 1 Ativan X 1 Plan of Care (if not transferred): Continue to monitor.   Call RRT if further assistance needed. Event Summary: Name of Physician Notified: Bruna Potter, NP at 2303    at    Outcome: Stayed in room and stabalized  Event End Time: 2315  Terrilyn Saver

## 2017-09-23 NOTE — Progress Notes (Signed)
Called to pt's room by pt's mother. Pt states that he is "anxious and paranoid". Pt states he feels "Like people are after me and bad things are gonna happen". Pt assured that he was safe and staff will make sure he stays safe. MD paged. Ativan changed from q6 to q4.

## 2017-09-23 NOTE — Progress Notes (Signed)
ANTICOAGULATION CONSULT NOTE  Pharmacy Consult for heparin Indication: pulmonary embolus  No Known Allergies  Patient Measurements: Height: 5\' 8"  (172.7 cm) Weight: 206 lb 12.7 oz (93.8 kg) IBW/kg (Calculated) : 68.4 Heparin Dosing Weight: 88 kg  Vital Signs: Temp: 98.1 F (36.7 C) (09/06 0641) Temp Source: Oral (09/06 0641) BP: 145/94 (09/06 0641) Pulse Rate: 105 (09/06 0641)  Labs: Recent Labs    09/21/17 2237 09/22/17 0212 09/22/17 0553 09/22/17 1025 09/23/17 0529  HGB 14.8  --   --   --  12.3*  HCT 43.5  --   --   --  37.4*  PLT 170  --   --   --  136*  APTT 37*  --   --   --   --   LABPROT 14.1  --   --   --   --   INR 1.10  --   --   --   --   HEPARINUNFRC  --   --   --  0.33 0.14*  CREATININE 0.66  --  0.58*  --  0.64  TROPONINI  --  <0.03  --   --   --     Estimated Creatinine Clearance: 154.2 mL/min (by C-G formula based on SCr of 0.64 mg/dL).   Medical History: Past Medical History:  Diagnosis Date  . ADD (attention deficit disorder)   . Allergic rhinitis   . Anxiety   . Arthritis    joint pain  . Chronic back pain   . Frontal head injury   . Heroin overdose (HCC)     Medications:  Medications Prior to Admission  Medication Sig Dispense Refill Last Dose  . busPIRone (BUSPAR) 15 MG tablet Take 1 tablet (15 mg total) by mouth 3 (three) times daily. 90 tablet 3 09/21/2017 at Unknown time  . citalopram (CELEXA) 40 MG tablet Take 1 tablet (40 mg total) by mouth daily. 90 tablet 0 09/21/2017 at Unknown time  . cyclobenzaprine (FLEXERIL) 10 MG tablet Take 1 tablet (10 mg total) by mouth 3 (three) times daily as needed for muscle spasms. 30 tablet 0 Past Week at Unknown time  . divalproex (DEPAKOTE) 500 MG DR tablet Take 1 tablet (500 mg total) by mouth every 12 (twelve) hours. For mood stabilization 180 tablet 0 09/21/2017 at Unknown time  . docusate sodium (COLACE) 100 MG capsule Take 1 capsule (100 mg total) by mouth 2 (two) times daily. (Patient taking  differently: Take 100 mg by mouth daily. ) 60 capsule 0 09/21/2017 at Unknown time  . furosemide (LASIX) 20 MG tablet Take 1 tablet (20 mg total) by mouth daily. Prn leg swelling (Patient taking differently: Take 20 mg by mouth daily as needed for fluid or edema. As needed for leg swelling) 30 tablet 2 unknown  . gabapentin (NEURONTIN) 300 MG capsule Take 2 capsules (600 mg total) by mouth 3 (three) times daily. For agitation 540 capsule 0 Past Week at Unknown time  . ibuprofen (ADVIL,MOTRIN) 600 MG tablet Take 1 tablet (600 mg total) by mouth every 6 (six) hours as needed for moderate pain. (May buy from over the counter): For pain 1 tablet 0 Past Month at Unknown time  . levothyroxine (SYNTHROID) 50 MCG tablet Take 1 tablet (50 mcg total) by mouth daily before breakfast. 30 tablet 1 09/21/2017 at Unknown time  . linaclotide (LINZESS) 290 MCG CAPS capsule Take 1 capsule (290 mcg total) by mouth daily before breakfast. 30 capsule 1 09/21/2017 at Unknown time  .  potassium chloride (K-DUR,KLOR-CON) 10 MEQ tablet Take 1 tablet (10 mEq total) by mouth daily. With furosemide (Patient taking differently: Take 10 mEq by mouth daily as needed (only takes when Lasix (Furosemide)). With furosemide) 30 tablet 2 unknown  . QUEtiapine (SEROQUEL) 400 MG tablet Take 2 tablets (800 mg total) by mouth at bedtime. 180 tablet 0 09/21/2017 at Unknown time  . traMADol (ULTRAM) 50 MG tablet Take 50 mg by mouth every 12 (twelve) hours as needed for moderate pain or severe pain.   0 09/21/2017 at Unknown time  . traZODone (DESYREL) 100 MG tablet Take 1 tablet (100 mg total) by mouth at bedtime. 90 tablet 0 09/21/2017 at Unknown time    Assessment: 27 yo male seen in the ED for severe abdominal pain and SOB. Radiology results show acute pulmonary embolism. Pharmacy has been consulted for IV heparin dosing.  Heparin level subtherapeutic at 0.14  Goal of Therapy:  Heparin level 0.3-0.7 units/ml Monitor platelets by anticoagulation  protocol: Yes   Plan:  Rebolus heparin 2500 units x 1  Increase heparin infusion to 1800 units/hr Check anti-Xa level in 6 hours and daily while on heparin Continue to monitor H&H and platelets   Judeth Cornfield, PharmD Clinical Pharmacist 09/23/2017 7:45 AM

## 2017-09-23 NOTE — Progress Notes (Signed)
Pt arrived from [facility] via [place]. Paged MD admissions via amion to notify of Arrival. Rose from Admissions called back. Dr Clearnce Sorrel to be hospitalist. [facility] in pharmacy notified of heparin gtt. States she will notify pharmacist. Pt given CHG bath.

## 2017-09-23 NOTE — Progress Notes (Signed)
Subjective: Continued abdominal distension and abdominal pain. No N/V. No bowel movement. Legs swollen. No breathing difficulties today. No other GI complaints.  Objective: Vital signs in last 24 hours: Temp:  [97.3 F (36.3 C)-98.3 F (36.8 C)] 98.1 F (36.7 C) (09/06 0641) Pulse Rate:  [90-108] 105 (09/06 0641) Resp:  [20] 20 (09/06 0641) BP: (140-145)/(91-104) 145/94 (09/06 0641) SpO2:  [93 %-100 %] 93 % (09/06 0641) Last BM Date: 09/19/17 General:   Alert and oriented, pleasant Head:  Normocephalic and atraumatic. Eyes:  No icterus, sclera clear. Conjuctiva pink.  Heart:  S1, S2 present, no murmurs noted.  Lungs: Clear to auscultation bilaterally, without wheezing, rales, or rhonchi.  Abdomen:  Bowel sounds present, significantly distended, firm, tender. No HSM or hernias noted. No rebound or guarding. No masses appreciated but limited exam due to degree of distension. Msk:  Symmetrical without gross deformities. Pulses:  Normal bilateral DP pulses noted. Extremities:  Without clubbing. Notable 2-3+ pitting edema bilateral LEs. Neurologic:  Alert and  oriented x4;  grossly normal neurologically. Psych:  Alert and cooperative. Normal mood and affect.  Intake/Output from previous day: 09/05 0701 - 09/06 0700 In: 354 [I.V.:354] Out: -  Intake/Output this shift: No intake/output data recorded.  Lab Results: Recent Labs    09/21/17 2237 09/23/17 0529  WBC 12.3* 5.9  HGB 14.8 12.3*  HCT 43.5 37.4*  PLT 170 136*   BMET Recent Labs    09/21/17 2237 09/22/17 0553 09/23/17 0529  NA 139 139 139  K 3.7 4.0 3.4*  CL 103 105 104  CO2 27 27 25   GLUCOSE 105* 111* 91  BUN 10 10 11   CREATININE 0.66 0.58* 0.64  CALCIUM 9.9 9.0 9.1   LFT Recent Labs    09/21/17 2237  PROT 8.2*  ALBUMIN 4.3  AST 18  ALT 13  ALKPHOS 92  BILITOT 0.8   PT/INR Recent Labs    09/21/17 2237  LABPROT 14.1  INR 1.10   Hepatitis Panel No results for input(s): HEPBSAG, HCVAB,  HEPAIGM, HEPBIGM in the last 72 hours.   Studies/Results: Dg Abdomen 1 View  Result Date: 09/21/2017 CLINICAL DATA:  Megacolon with little bowel movement while in hospital. EXAM: ABDOMEN - 1 VIEW COMPARISON:  Gastrografin enema images from 09/18/2017 and CT from 09/15/2017 FINDINGS: Significant stool retention throughout the visualized colon compatible with severe constipation and obstipation. Free air identified. No acute osseous appearing abnormality limited by overlying stool filled bowel. IMPRESSION: Significant stool retention throughout the colon. No significant change. Electronically Signed   By: Tollie Eth M.D.   On: 09/21/2017 23:22   Ct Angio Chest Pe W Or Wo Contrast  Result Date: 09/22/2017 CLINICAL DATA:  Recent admission for megacolon. Now unable to eat solids with severely distended abdomen. Shortness of breath. EXAM: CT ANGIOGRAPHY CHEST CT ABDOMEN AND PELVIS WITH CONTRAST TECHNIQUE: Multidetector CT imaging of the chest was performed using the standard protocol during bolus administration of intravenous contrast. Multiplanar CT image reconstructions and MIPs were obtained to evaluate the vascular anatomy. Multidetector CT imaging of the abdomen and pelvis was performed using the standard protocol during bolus administration of intravenous contrast. CONTRAST:  ISOVUE-370 IOPAMIDOL (ISOVUE-370) INJECTION 76% COMPARISON:  CT abdomen and pelvis 09/15/2017. FINDINGS: CTA CHEST FINDINGS Cardiovascular: Motion artifact limits examination. There is good opacification of the central and segmental pulmonary arteries. Filling defects are demonstrated in the distal main and lower lobe pulmonary arteries bilaterally consistent with bilateral pulmonary emboli. Normal caliber thoracic  aorta. Great vessel origins are patent. Normal heart size. Small pericardial effusion. Normal RV to LV ratio at 0.8. Mediastinum/Nodes: No significant lymphadenopathy. Esophagus is decompressed. Lungs/Pleura:  Emphysematous changes and fibrosis in both lungs. Fluid or thickened pleura in the fissures. Patchy areas of infiltration or consolidation in both lungs could represent pneumonia or infarcts. No pleural effusions. No pneumothorax. Airways are patent. Musculoskeletal: No chest wall abnormality. No acute or significant osseous findings. Review of the MIP images confirms the above findings. CT ABDOMEN and PELVIS FINDINGS Hepatobiliary: No focal liver lesions. Gallbladder is mildly dilated with probable sludge. No wall thickening. Bile ducts are not dilated. Pancreas: Unremarkable. No pancreatic ductal dilatation or surrounding inflammatory changes. Spleen: Normal in size without focal abnormality. Adrenals/Urinary Tract: Adrenal glands are unremarkable. Kidneys are normal, without renal calculi, focal lesion, or hydronephrosis. Bladder is thick-walled, suggesting cystitis. Stomach/Bowel: There is massive distention of the colon throughout. The colon is filled with gas and stool. Large amount of stool in the rectum. Rectal diameter measures up to 17 cm. Rectal wall thickening may indicate stercoral colitis. Small bowel and stomach are decompressed. Vascular/Lymphatic: No significant vascular findings are present. No enlarged abdominal or pelvic lymph nodes. Prominent bilateral groin lymph nodes of nonspecific etiology, probably reactive. Reproductive: Prostate is unremarkable. Other: No free air or free fluid in the abdomen. Edema in the subcutaneous fat. Musculoskeletal: No acute or significant osseous findings. Review of the MIP images confirms the above findings. IMPRESSION: 1. Positive examination for bilateral pulmonary emboli. No evidence of right heart strain. 2. Diffuse emphysematous changes in the lungs. Patchy airspace disease may represent pneumonia or infarcts. 3. Massive distention of the colon throughout. Colon is filled with gas and mostly stool. Large amount of stool in the rectum with rectal wall  thickening may indicate stercoral colitis. Emphysema (ICD10-J43.9). These results were called by telephone at the time of interpretation on 09/22/2017 at 1:07 am to Dr. Cheron Schaumann , who verbally acknowledged these results. Electronically Signed   By: Burman Nieves M.D.   On: 09/22/2017 01:07   Ct Abdomen Pelvis W Contrast  Result Date: 09/22/2017 CLINICAL DATA:  Recent admission for megacolon. Now unable to eat solids with severely distended abdomen. Shortness of breath. EXAM: CT ANGIOGRAPHY CHEST CT ABDOMEN AND PELVIS WITH CONTRAST TECHNIQUE: Multidetector CT imaging of the chest was performed using the standard protocol during bolus administration of intravenous contrast. Multiplanar CT image reconstructions and MIPs were obtained to evaluate the vascular anatomy. Multidetector CT imaging of the abdomen and pelvis was performed using the standard protocol during bolus administration of intravenous contrast. CONTRAST:  ISOVUE-370 IOPAMIDOL (ISOVUE-370) INJECTION 76% COMPARISON:  CT abdomen and pelvis 09/15/2017. FINDINGS: CTA CHEST FINDINGS Cardiovascular: Motion artifact limits examination. There is good opacification of the central and segmental pulmonary arteries. Filling defects are demonstrated in the distal main and lower lobe pulmonary arteries bilaterally consistent with bilateral pulmonary emboli. Normal caliber thoracic aorta. Great vessel origins are patent. Normal heart size. Small pericardial effusion. Normal RV to LV ratio at 0.8. Mediastinum/Nodes: No significant lymphadenopathy. Esophagus is decompressed. Lungs/Pleura: Emphysematous changes and fibrosis in both lungs. Fluid or thickened pleura in the fissures. Patchy areas of infiltration or consolidation in both lungs could represent pneumonia or infarcts. No pleural effusions. No pneumothorax. Airways are patent. Musculoskeletal: No chest wall abnormality. No acute or significant osseous findings. Review of the MIP images confirms the  above findings. CT ABDOMEN and PELVIS FINDINGS Hepatobiliary: No focal liver lesions. Gallbladder is mildly dilated with  probable sludge. No wall thickening. Bile ducts are not dilated. Pancreas: Unremarkable. No pancreatic ductal dilatation or surrounding inflammatory changes. Spleen: Normal in size without focal abnormality. Adrenals/Urinary Tract: Adrenal glands are unremarkable. Kidneys are normal, without renal calculi, focal lesion, or hydronephrosis. Bladder is thick-walled, suggesting cystitis. Stomach/Bowel: There is massive distention of the colon throughout. The colon is filled with gas and stool. Large amount of stool in the rectum. Rectal diameter measures up to 17 cm. Rectal wall thickening may indicate stercoral colitis. Small bowel and stomach are decompressed. Vascular/Lymphatic: No significant vascular findings are present. No enlarged abdominal or pelvic lymph nodes. Prominent bilateral groin lymph nodes of nonspecific etiology, probably reactive. Reproductive: Prostate is unremarkable. Other: No free air or free fluid in the abdomen. Edema in the subcutaneous fat. Musculoskeletal: No acute or significant osseous findings. Review of the MIP images confirms the above findings. IMPRESSION: 1. Positive examination for bilateral pulmonary emboli. No evidence of right heart strain. 2. Diffuse emphysematous changes in the lungs. Patchy airspace disease may represent pneumonia or infarcts. 3. Massive distention of the colon throughout. Colon is filled with gas and mostly stool. Large amount of stool in the rectum with rectal wall thickening may indicate stercoral colitis. Emphysema (ICD10-J43.9). These results were called by telephone at the time of interpretation on 09/22/2017 at 1:07 am to Dr. Cheron Schaumann , who verbally acknowledged these results. Electronically Signed   By: Burman Nieves M.D.   On: 09/22/2017 01:07   Dg Chest Portable 1 View  Result Date: 09/21/2017 CLINICAL DATA:  Shortness of  breath EXAM: PORTABLE CHEST 1 VIEW COMPARISON:  08/18/2017 08/27/2013 FINDINGS: Low lung volumes. Subsegmental atelectasis left base. Increasing opacity at the right base. Stable cardiomediastinal silhouette. No pneumothorax. IMPRESSION: Low lung volumes with atelectasis at the left base. Increasing airspace disease at the right base, atelectasis versus mild infiltrate. Electronically Signed   By: Jasmine Pang M.D.   On: 09/21/2017 23:28    Assessment: 27 year old gentleman presenting with ongoing abdominal distention/pain, obstipation, colonic distention and now with complaints of hemoptysis.  Work-up revealed bilateral pulmonary emboli.  Patient currently on continuous heparin. Repeat CT imaging performed which found continued massive distention of the colon throughout with mostly stool, large amount stool in the rectum. Rectum measures 17 cm.  Films were reviewed with Dr. Tyron Russell.  Patient cecum measures 9 to 10 cm, sigmoid colon up to 18 cm.  Some degree of venous compression throughout the abdomen particularly common iliac, left common iliac.  Patient has failed medical management at this point.  Patient is at increased risk of bowel perforation.  It was recommended to have a formal surgical consultation for further recommendations and management.  Patient with multiple complicating factors including new bilateral pulmonary emboli on heparin, prior diagnosis of cardiomyopathy in 2015 in the setting of polysubstance abuse with reported normalization of EF when rechecked later in 2015. Cardiology and surgery was consulted for input related to possible neostigmine administration for bowel decompression.  Today he continues with abdominal distension, tenderness. No bowel movement. Breathing well off oxygen. Heparin continues. He continues with bilateral LE edema likely from poor venous return related to compression from massively distended rectum. We discussed possible treatment options (including  neostigmine) and possible need for urgent surgery should he suffer a bowel perforation. Awaiting formal sugical note, but impression from previous discussions is that the patient is not a surgical candidate here due to multiple PE, heparin, risk of further embolus, and other complicating factors. We also discussed  the likelihood of eventual surgical resection.  Case was discuss with Ronie Spies, PA and Dr. Dietrich Pates, MD of cardiology. They feel iven complex medical presentation that the patient should be transferred to [facility] for administration of neostigmine due to more significant resources (cardiology, bedside temp pacer capabilities, trauma surgery, neuro ICU/specialists, etc) for all possible complications that could arise in this very complex individual.    Plan: 1. Patient would likely benefit from transfer to [facility] for higher level of care, will discuss with Dr. Darrick Penna 2. Further recommendations to follow. 3. Supportive measures.   Thank you for allowing [place] to participate in the care of Wendy L Hester  Wynne Dust, DNP, AGNP-C Adult & Gerontological Nurse Practitioner [facility]     LOS: 1 day    09/23/2017, 8:49 AM
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
## 2017-09-23 NOTE — Consult Note (Signed)
Subjective:   HPI  The patient is a 27 year old male who was transferred to this hospital today from [facility] was in [facility] a week ago with colonic distention and diagnosed with Ogilvie syndrome. The patient has a long history of chronic constipation and tells me that he has been constipated ever since he was a young child. He also has a long history of narcotic use which has been chronic. A recent CT scan showed evidence of colonic inertia and megacolon. He was seen both at [facility] by a surgeon and also here by a surgeon (Dr. Luisa Hart). In reviewing Dr. Nadara Mustard note it appears that they have discussed total colectomy to helps all this problem. It was felt however that more immediate decompression would be of benefit as at this time he would be a high risk for surgery. He was also seen by gastroenterology Dr. Benard Rink at [facility]. In reviewing records it appears that he has failed medical therapy and they were considering the use of neostigmine in order to decompress the colon from his Ogilvie syndrome. He was transferred to this hospital however because they did not have the monitoring capacity at the other hospital to give neostigmine. He has developed bilateral pulmonary emboli confirmed on CT scan. And he is on heparin at this time. He tells me that he was sent home from [facility] with a little improvement in his abdominal distention and was sent home on Linzess for constipation that it did not help. He has been passing some gas. He had a Gastrografin enema at the other hospital on September 1 which was felt to be a successful procedure however the Gastrografin could only be advanced up to the descending colon. There was significant stool postevacuation. The CT scan showed bilateral pulmonary emboli and massive distention of the colon throughout filled with gas and mostly stool.     Past Medical History:  Diagnosis Date  . ADD (attention deficit  disorder)   . Aggression   . Allergic rhinitis   . Anxiety   . Arthritis    joint pain  . Cardiomyopathy (HCC)    a. EF <25% in 2015 during admission for multi substance intoxication/respiratory failure requiring intubation.  . Chronic back pain   . Constipation   . Depression   . Fecal obstruction (HCC)   . Frontal head injury   . Heroin overdose (HCC)   . Obstipation   . Polysubstance abuse (HCC)   . Suicide attempt [facility])    Past Surgical History:  Procedure Laterality Date  . FOOT SURGERY     to get a BB out  . NO PAST SURGERIES     Social History   Socioeconomic History  . Marital status: Single    Spouse name: Not on file  . Number of children: Not on file  . Years of education: Not on file  . Highest education level: 12th grade  Occupational History  . Not on file  Social Needs  . Financial resource strain: Somewhat hard  . Food insecurity:    Worry: Never true    Inability: Never true  . Transportation needs:    Medical: No    Non-medical: No  Tobacco Use  . Smoking status: Current Every Day Smoker    Packs/day: 1.00    Years: 7.00    Pack years: 7.00    Types: Cigarettes  . Smokeless tobacco: Never Used  Substance and Sexual Activity  . Alcohol use: Not  Currently    Comment: hx of. last use 01/2016  . Drug use: Yes    Types: Marijuana, Cocaine    Comment: last used marijuana 3 weeks ago. heroin last used  6 months ago.   Marland Kitchen Sexual activity: Yes    Birth control/protection: Condom  Lifestyle  . Physical activity:    Days per week: 0 days    Minutes per session: 0 min  . Stress: To some extent  Relationships  . Social connections:    Talks on phone: More than three times a week    Gets together: Twice a week    Attends religious service: Never    Active member of club or organization: No    Attends meetings of clubs or organizations: Never    Relationship status: Never married  . Intimate partner violence:    Fear of current or ex partner: No     Emotionally abused: No    Physically abused: No    Forced sexual activity: No  Other Topics Concern  . Not on file  Social History Narrative  . Not on file   family history includes Alcohol abuse in his father; Cancer in his paternal grandfather; Cancer - Lung in his paternal grandmother; Cancer - Ovarian in his maternal grandmother; Dementia in his father; Diabetes in his brother and other; Heart attack in his other; Heart disease in his other; Hypertension in his other.  Current Facility-Administered Medications:  .  0.9 %  sodium chloride infusion, , Intravenous, Continuous, Purohit, Shrey C, MD, Last Rate: 100 mL/hr at 09/23/17 1847 .  acetaminophen (TYLENOL) tablet 650 mg, 650 mg, Oral, Q6H PRN **OR** acetaminophen (TYLENOL) suppository 650 mg, 650 mg, Rectal, Q6H PRN, Philip Aspen, Limmie Patricia, MD .  heparin ADULT infusion 100 units/mL (25000 units/252mL sodium chloride 0.45%), 1,800 Units/hr, Intravenous, Continuous, Philip Aspen, Limmie Patricia, MD, Last Rate: 18 mL/hr at 09/23/17 0827, 1,800 Units/hr at 09/23/17 0827 .  ketorolac (TORADOL) 30 MG/ML injection 15-30 mg, 15-30 mg, Intravenous, Q6H PRN, Philip Aspen, Limmie Patricia, MD, 30 mg at 09/23/17 1545 .  levothyroxine (SYNTHROID, LEVOTHROID) injection 37.5 mcg, 37.5 mcg, Intravenous, Daily, Purohit, Shrey C, MD .  LORazepam (ATIVAN) injection 1 mg, 1 mg, Intravenous, Q4H PRN, Purohit, Shrey C, MD .  milk and molasses enema, 1 enema, Rectal, Once, Philip Aspen, Limmie Patricia, MD .  nicotine (NICODERM CQ - dosed in mg/24 hours) patch 21 mg, 21 mg, Transdermal, Daily, Philip Aspen, Limmie Patricia, MD, 21 mg at 09/23/17 1451 .  ondansetron (ZOFRAN) tablet 4 mg, 4 mg, Oral, Q6H PRN **OR** ondansetron (ZOFRAN) injection 4 mg, 4 mg, Intravenous, Q6H PRN, Philip Aspen, Limmie Patricia, MD .  sodium chloride flush (NS) 0.9 % injection 3 mL, 3 mL, Intravenous, Q12H, Philip Aspen, Limmie Patricia, MD, 3 mL at 09/23/17 1346 No Known Allergies    Objective:     BP (!) 136/99   Pulse (!) 107   Temp 97.6 F (36.4 C) (Oral)   Resp (!) 25   Ht 5\' 8"  (1.727 m)   Wt 93.8 kg   SpO2 100%   BMI 31.44 kg/m   He is up walking in the room. He does not appear in acute distress.  Heart regular rhythm  Lungs clear  Abdomen: Distended and firm not tender to palpation. No signs of peritonitis  Laboratory No components found for: D1    Assessment:     Ogilvie syndrome  Pulmonary embolus bilaterally  Narcotic and substance abuse per history  Chronic constipation      Plan:     The patient may benefit from the use of neostigmine. He was transferred to this hospital for this. I would recommend trying neostigmine to see if this helps. I would recommend using the dosage of 2 mg IV over 3-5 minutes. Atropine should be available in the event of bradycardia. We try this over a couple of days and see if this helps. If this did not help then the next step would be to consider colonic decompression. The patient states that they did mention that to him at the other hospital but both he and the doctors there according to him were fearful of doing this especially with the pulmonary emboli and his being on heparin now. I would recommend trying neostigmine as mentioned above first. Please order this when monitoring is available and ready. We will follow.

## 2017-09-24 DIAGNOSIS — K598 Other specified functional intestinal disorders: Secondary | ICD-10-CM

## 2017-09-24 LAB — CBC
HCT: 39.6 % (ref 39.0–52.0)
HEMOGLOBIN: 12.8 g/dL — AB (ref 13.0–17.0)
MCH: 27.4 pg (ref 26.0–34.0)
MCHC: 32.3 g/dL (ref 30.0–36.0)
MCV: 84.8 fL (ref 78.0–100.0)
PLATELETS: 165 10*3/uL (ref 150–400)
RBC: 4.67 MIL/uL (ref 4.22–5.81)
RDW: 14.2 % (ref 11.5–15.5)
WBC: 7.3 10*3/uL (ref 4.0–10.5)

## 2017-09-24 LAB — COMPREHENSIVE METABOLIC PANEL
ALT: 13 U/L (ref 0–44)
AST: 15 U/L (ref 15–41)
Alkaline Phosphatase: 74 U/L (ref 38–126)
BUN: 11 mg/dL (ref 6–20)
Calcium: 9.5 mg/dL (ref 8.9–10.3)
Creatinine, Ser: 0.8 mg/dL (ref 0.61–1.24)
GFR calc non Af Amer: >60 mL/min (ref >60)
Total Bilirubin: 0.6 mg/dL (ref 0.3–1.2)
Total Protein: 6.5 g/dL (ref 6.5–8.1)

## 2017-09-24 LAB — HEPARIN LEVEL (UNFRACTIONATED): Heparin Unfractionated: 0.36 IU/mL (ref 0.30–0.70)

## 2017-09-24 LAB — COMPREHENSIVE METABOLIC PANEL WITH GFR
Albumin: 3.6 g/dL (ref 3.5–5.0)
Anion gap: 10 (ref 5–15)
CO2: 28 mmol/L (ref 22–32)
Chloride: 102 mmol/L (ref 98–111)
GFR calc Af Amer: >60 mL/min (ref >60)
Glucose, Bld: 108 mg/dL — ABNORMAL HIGH (ref 70–99)
Potassium: 3.2 mmol/L — ABNORMAL LOW (ref 3.5–5.1)
Sodium: 140 mmol/L (ref 135–145)

## 2017-09-24 LAB — BASIC METABOLIC PANEL
ANION GAP: 8 (ref 5–15)
BUN: 11 mg/dL (ref 6–20)
CO2: 26 mmol/L (ref 22–32)
Calcium: 9.2 mg/dL (ref 8.9–10.3)
Chloride: 104 mmol/L (ref 98–111)
Creatinine, Ser: 0.81 mg/dL (ref 0.61–1.24)
GFR calc Af Amer: >60 mL/min (ref >60)
GLUCOSE: 102 mg/dL — AB (ref 70–99)
POTASSIUM: 3.5 mmol/L (ref 3.5–5.1)
Sodium: 138 mmol/L (ref 135–145)

## 2017-09-24 LAB — MAGNESIUM
Magnesium: 1.8 mg/dL (ref 1.7–2.4)
Magnesium: 1.7 mg/dL (ref 1.7–2.4)

## 2017-09-24 LAB — PHOSPHORUS: Phosphorus: 3.7 mg/dL (ref 2.5–4.6)

## 2017-09-24 LAB — TROPONIN I: Troponin I: <0.03 ng/mL (ref <0.03)

## 2017-09-24 MED ORDER — ORAL CARE MOUTH RINSE
15.0000 mL | Freq: Two times a day (BID) | OROMUCOSAL | Status: DC
  Administered 2017-09-24 – 2017-09-25 (×3): 15 mL via OROMUCOSAL

## 2017-09-24 NOTE — Progress Notes (Signed)
   Subjective/Chief Complaint: No change.    Objective: Vital signs in last 24 hours: Temp:  [97.5 F (36.4 C)-98.4 F (36.9 C)] 97.5 F (36.4 C) (09/07 0802) Pulse Rate:  [101-133] 101 (09/07 0802) Resp:  [11-25] 16 (09/07 0802) BP: (126-142)/(95-108) 134/101 (09/07 0802) SpO2:  [93 %-100 %] 99 % (09/07 0802) Weight:  [94.7 kg] 94.7 kg (09/07 0408) Last BM Date: 08/18/17  Intake/Output from previous day: 09/06 0701 - 09/07 0700 In: 1263.5 [I.V.:1263.5] Out: -  Intake/Output this shift: No intake/output data recorded.  General appearance: somnolent and chronically ill/ cachectic Resp: not tachypneic Cardio: mildly tachycardic.  GI: distended, mildly tender Skin: Skin color, texture, turgor normal. No rashes or lesions Neurologic: Grossly normal  Lab Results:  Recent Labs    09/23/17 0529 09/24/17 0016  WBC 5.9 7.3  HGB 12.3* 12.8*  HCT 37.4* 39.6  PLT 136* 165   BMET Recent Labs    09/23/17 0529 09/24/17 0016  NA 139 140  K 3.4* 3.2*  CL 104 102  CO2 25 28  GLUCOSE 91 108*  BUN 11 11  CREATININE 0.64 0.80  CALCIUM 9.1 9.5   PT/INR Recent Labs    09/21/17 2237  LABPROT 14.1  INR 1.10   ABG No results for input(s): PHART, HCO3 in the last 72 hours.  Invalid input(s): PCO2, PO2  Studies/Results: No results found.  Anti-infectives: Anti-infectives (From admission, onward)   None      Assessment/Plan: Megacolon with rectal fecal impaction- Colonic inertia versus short segment Hirschsprung's disease vs patient has a long-standing history of chronic narcotic abuse and chronic substance abuse.  This certainly can lead to this condition.  He has a long-standing history of chronic constipation. Ultimately he would benefit from total abdominal colectomy with ileostomy.  His rectal stump will be too large for anastomosis and we need time to decompress.   Acute bilateral pulmonary emboli- Any surgical intervention in the next few days is ill  advised.  He has significant cardiovascular risk given his large pulmonary embolus and this needs time to mature.  Appreciate GI consultation considering neostigmine vs short term endoscopic decompression.  This is a chronic long-standing problem therefore risk of perforation is less compared to acute megacolon.  His white count is normal.  He may feel better with decompression.  Neostigmine can be used but I doubt it will be very helpful in this circumstance since this is a chronic long-term problem with severe colonic megacolon and inertia.  We will follow for now.  Ideally, total abdominal colectomy next week after at least having on heparin for 5 to 7 days would be beneficial.   LOS: 2 days    Berna Bue 09/24/2017

## 2017-09-24 NOTE — Progress Notes (Addendum)
PROGRESS NOTE  BUEL MOLDER ZOX:[id number] DOB: 1990-05-07 DOA: 09/21/2017 PCP: [name], MD  Brief Narrative: 27 year old man PMH polysubstance abuse in remission, major depression, hypothyroidism, chronic constipation presenting with abdominal pain, massive abdominal distention, nausea, vomiting.  Known to have megacolon with massive stool retention unrelieved by Gastrografin enema.  Admitted to [facility] 9/5 for bilateral pulmonary emboli, acute hypoxic respiratory failure, Ogilvie syndrome with massive retention of stool.  GI recommended neostigmine.  Cardiology felt it best to transfer to [facility] for closer monitoring and cardiology support.  General surgery recommended colectomy but at this time wishes to defer until next week given acute PE.  Assessment/Plan Refractory megacolon, Ogilvie syndrome.  Failed maximal medical therapy.  GI recommended neostigmine 2 mg IV via slow 5-minute push on September 5.  Per that note, this needs to be given with cardiac monitoring watching for bradycardia and transient asystole.  Cardiology saw patient September 6 and recommended transfer to [facility] for access to cardiology there given potential for bradycardia or ventricular arrhythmias, especially in the setting of bilateral pulmonary embolism.  Although intensivist was repeatedly mentioned in the chart, the patient was transferred to the hospitalist service given clinical stability. --Discussed with Dr. Evette Cristal this morning, he continues to recommend neostigmine, requests hospitalist service order. --Will discuss with cardiology risk/benefit, any further recommendations before proceeding.  Repeat BMP, magnesium, phosphorus and EKG prior to initiation. Addendum.  Discussed with Dr. Antoine Poche.  Discussed risk/benefit with patient and mother at bedside.  They declined to accept the risk of medication. I updated Dr. Evette Cristal.    Bilateral pulmonary emboli with acute hypoxic respiratory failure.   Thought secondary to impediment and venous return.  No evidence of heart strain by CT or echocardiogram.  Bilateral lower extremity venous Dopplers negative.. EKG 9/6 ST. --Continue heparin infusion  Massive bilateral lower extremity edema.  Venous Dopplers negative.  Likely secondary to impeded venous return from massive colonic distention.  Emphysema, subclinical.  CT suggested opacity airspace disease which in this clinical context may be related to infarcts.  No evidence clinically of pneumonia.  Hypothyroidism, started on Synthroid 50 mcg on discharge 9/2. --Continue levothyroxine  PMH ADD, anxiety, major depressive disorder, polysubstance abuse,  --Citalopram, Depakote, quetiapine, trazodone, buspirone on hold  Chronic back pain.  Gabapentin and cyclobenzaprine on hold.  DVT prophylaxis:  Heparin infusion Code Status: Full Family Communication: none Disposition Plan: home    Brendia Sacks, MD  [facility] Direct contact: [phone] --Via amion app OR  --[email or web address]; password [email or web address]  7PM-7AM contact night coverage as above 09/24/2017, 11:13 AM  LOS: 2 days   Consultants:  GI  General surgery  Cardiology   Procedures:  Echo Study Conclusions  - Left ventricle: The cavity size was normal. Wall thickness was   normal. Systolic function was normal. The estimated ejection   fraction was in the range of 55% to 60%. - Pericardium, extracardiac: A trivial pericardial effusion was   identified.  Antimicrobials:    Interval history/Subjective: No BM. Has abd pain and back pain. A little SOB. Had an episode of chest pain overnight.  Objective: Vitals:  Vitals:   09/24/17 0802 09/24/17 0900  BP: (!) 134/101 (!) 128/111  Pulse: (!) 101 (!) 110  Resp: 16 19  Temp: (!) 97.5 F (36.4 C) 97.9 F (36.6 C)  SpO2: 99% 96%    Exam:  Constitutional:  . Appears calm, ill but non-toxic Eyes:  . pupils and irises appear normal . Normal lids  ENMT:   . Bitemporal wasting noted Respiratory:  . CTA bilaterally, no w/r/r.  . Respiratory effort normal.  Cardiovascular:  . RRR, no m/r/g . No LE extremity edema   . Telemetry SR Abdomen:  . Abdomen markedly distended, decreased BS Psychiatric:  . Mental status o Mood, affect appropriate  I have personally reviewed the following:   Labs:  Potassium 3.2, remainder CMP unremarkable at midnight.  Imaging reviewed including CT chest and abdomen pelvis.    Scheduled Meds: . levothyroxine  37.5 mcg Intravenous Daily  . mouth rinse  15 mL Mouth Rinse BID  . milk and molasses  1 enema Rectal Once  . nicotine  21 mg Transdermal Daily  . sodium chloride flush  3 mL Intravenous Q12H   Continuous Infusions: . sodium chloride 100 mL/hr at 09/24/17 0416  . heparin 1,800 Units/hr (09/23/17 2107)    Principal Problem:   Ogilvie's syndrome Active Problems:   MDD (major depressive disorder), recurrent severe, without psychosis (HCC)   Leg edema   Fecal impaction (HCC)   Acute respiratory failure with hypoxia (HCC)   Pulmonary embolism, bilateral (HCC)   Hypothyroidism   Colon distention   LOS: 2 days    Time spent review of chart, discussion with patient, coordination with GI and cardiology 50 minutes, greater than 50% in coordination and counseling.

## 2017-09-24 NOTE — Progress Notes (Signed)
[facility] Progress Note  Subjective: No change in clinical status today. Abdomen still distended. Patient is walking in the room.  Objective: Vital signs in last 24 hours: Temp:  [97.5 F (36.4 C)-98.4 F (36.9 C)] 97.9 F (36.6 C) (09/07 0900) Pulse Rate:  [101-133] 110 (09/07 0900) Resp:  [11-25] 19 (09/07 0900) BP: (126-142)/(95-111) 128/111 (09/07 0900) SpO2:  [93 %-100 %] 96 % (09/07 0900) Weight:  [94.7 kg] 94.7 kg (09/07 0408) Weight change:    PE:  No distress  Abdomen distended tense no change from yesterday  Lab Results: Results for orders placed or performed during the hospital encounter of 09/21/17 (from the past 24 hour(s))  Heparin level (unfractionated)     Status: None   Collection Time: 09/23/17  2:30 PM  Result Value Ref Range   Heparin Unfractionated 0.53 0.30 - 0.70 IU/mL  Heparin level (unfractionated)     Status: None   Collection Time: 09/23/17  9:38 PM  Result Value Ref Range   Heparin Unfractionated 0.33 0.30 - 0.70 IU/mL  CBC     Status: Abnormal   Collection Time: 09/24/17 12:16 AM  Result Value Ref Range   WBC 7.3 4.0 - 10.5 K/uL   RBC 4.67 4.22 - 5.81 MIL/uL   Hemoglobin 12.8 (L) 13.0 - 17.0 g/dL   HCT 22.2 97.9 - 89.2 %   MCV 84.8 78.0 - 100.0 fL   MCH 27.4 26.0 - 34.0 pg   MCHC 32.3 30.0 - 36.0 g/dL   RDW 11.9 41.7 - 40.8 %   Platelets 165 150 - 400 K/uL  Comprehensive metabolic panel     Status: Abnormal   Collection Time: 09/24/17 12:16 AM  Result Value Ref Range   Sodium 140 135 - 145 mmol/L   Potassium 3.2 (L) 3.5 - 5.1 mmol/L   Chloride 102 98 - 111 mmol/L   CO2 28 22 - 32 mmol/L   Glucose, Bld 108 (H) 70 - 99 mg/dL   BUN 11 6 - 20 mg/dL   Creatinine, Ser 1.44 0.61 - 1.24 mg/dL   Calcium 9.5 8.9 - 81.8 mg/dL   Total Protein 6.5 6.5 - 8.1 g/dL   Albumin 3.6 3.5 - 5.0 g/dL   AST 15 15 - 41 U/L   ALT 13 0 - 44 U/L   Alkaline Phosphatase 74 38 - 126 U/L   Total Bilirubin 0.6 0.3 - 1.2 mg/dL   GFR calc non Af  Amer >60 >60 mL/min   GFR calc Af Amer >60 >60 mL/min   Anion gap 10 5 - 15  Magnesium     Status: None   Collection Time: 09/24/17 12:16 AM  Result Value Ref Range   Magnesium 1.8 1.7 - 2.4 mg/dL  Heparin level (unfractionated)     Status: None   Collection Time: 09/24/17 12:16 AM  Result Value Ref Range   Heparin Unfractionated 0.36 0.30 - 0.70 IU/mL  Troponin I     Status: None   Collection Time: 09/24/17 12:16 AM  Result Value Ref Range   Troponin I <0.03 <0.03 ng/mL    Studies/Results: No results found.    Assessment: Ogilvy syndrome  With megacolon  Plan:   Trial of neostigmine. If this fails attempt at colonic decompression    Gwenevere Abbot 09/24/2017, 10:21 AM  Pager: [phone] If no answer or after 5 PM call [phone]

## 2017-09-24 NOTE — Progress Notes (Signed)
RN paged provider to make aware BP 135/109, awaiting response.  P.J. Henderson Newcomer, RN

## 2017-09-24 NOTE — Progress Notes (Signed)
ANTICOAGULATION CONSULT NOTE  Pharmacy Consult for heparin Indication: pulmonary embolus  No Known Allergies  Patient Measurements: Height: 5\' 8"  (172.7 cm) Weight: 208 lb 12.4 oz (94.7 kg) IBW/kg (Calculated) : 68.4 Heparin Dosing Weight: 88 kg  Vital Signs: Temp: 97.9 F (36.6 C) (09/07 0900) Temp Source: Oral (09/07 0900) BP: 128/111 (09/07 0900) Pulse Rate: 110 (09/07 0900)  Labs: Recent Labs    09/21/17 2237 09/22/17 0212 09/22/17 0553  09/23/17 0529 09/23/17 1430 09/23/17 2138 09/24/17 0016  HGB 14.8  --   --   --  12.3*  --   --  12.8*  HCT 43.5  --   --   --  37.4*  --   --  39.6  PLT 170  --   --   --  136*  --   --  165  APTT 37*  --   --   --   --   --   --   --   LABPROT 14.1  --   --   --   --   --   --   --   INR 1.10  --   --   --   --   --   --   --   HEPARINUNFRC  --   --   --    < > 0.14* 0.53 0.33 0.36  CREATININE 0.66  --  0.58*  --  0.64  --   --  0.80  TROPONINI  --  <0.03  --   --   --   --   --  <0.03   < > = values in this interval not displayed.    Estimated Creatinine Clearance: 154.8 mL/min (by C-G formula based on SCr of 0.8 mg/dL).   Medical History: Past Medical History:  Diagnosis Date  . ADD (attention deficit disorder)   . Aggression   . Allergic rhinitis   . Anxiety   . Arthritis    joint pain  . Cardiomyopathy (HCC)    a. EF <25% in 2015 during admission for multi substance intoxication/respiratory failure requiring intubation.  . Chronic back pain   . Constipation   . Depression   . Fecal obstruction (HCC)   . Frontal head injury   . Heroin overdose (HCC)   . Obstipation   . Polysubstance abuse (HCC)   . Suicide attempt [facility])     Medications:  Medications Prior to Admission  Medication Sig Dispense Refill Last Dose  . busPIRone (BUSPAR) 15 MG tablet Take 1 tablet (15 mg total) by mouth 3 (three) times daily. 90 tablet 3 09/21/2017 at Unknown time  . citalopram (CELEXA) 40 MG tablet Take 1 tablet (40 mg total) by  mouth daily. 90 tablet 0 09/21/2017 at Unknown time  . cyclobenzaprine (FLEXERIL) 10 MG tablet Take 1 tablet (10 mg total) by mouth 3 (three) times daily as needed for muscle spasms. 30 tablet 0 Past Week at Unknown time  . divalproex (DEPAKOTE) 500 MG DR tablet Take 1 tablet (500 mg total) by mouth every 12 (twelve) hours. For mood stabilization 180 tablet 0 09/21/2017 at Unknown time  . docusate sodium (COLACE) 100 MG capsule Take 1 capsule (100 mg total) by mouth 2 (two) times daily. (Patient taking differently: Take 100 mg by mouth daily. ) 60 capsule 0 09/21/2017 at Unknown time  . furosemide (LASIX) 20 MG tablet Take 1 tablet (20 mg total) by mouth daily. Prn leg swelling (Patient taking differently: Take 20  mg by mouth daily as needed for fluid or edema. As needed for leg swelling) 30 tablet 2 unknown  . gabapentin (NEURONTIN) 300 MG capsule Take 2 capsules (600 mg total) by mouth 3 (three) times daily. For agitation 540 capsule 0 Past Week at Unknown time  . ibuprofen (ADVIL,MOTRIN) 600 MG tablet Take 1 tablet (600 mg total) by mouth every 6 (six) hours as needed for moderate pain. (May buy from over the counter): For pain 1 tablet 0 Past Month at Unknown time  . levothyroxine (SYNTHROID) 50 MCG tablet Take 1 tablet (50 mcg total) by mouth daily before breakfast. 30 tablet 1 09/21/2017 at Unknown time  . linaclotide (LINZESS) 290 MCG CAPS capsule Take 1 capsule (290 mcg total) by mouth daily before breakfast. 30 capsule 1 09/21/2017 at Unknown time  . potassium chloride (K-DUR,KLOR-CON) 10 MEQ tablet Take 1 tablet (10 mEq total) by mouth daily. With furosemide (Patient taking differently: Take 10 mEq by mouth daily as needed (only takes when Lasix (Furosemide)). With furosemide) 30 tablet 2 unknown  . QUEtiapine (SEROQUEL) 400 MG tablet Take 2 tablets (800 mg total) by mouth at bedtime. 180 tablet 0 09/21/2017 at Unknown time  . traMADol (ULTRAM) 50 MG tablet Take 50 mg by mouth every 12 (twelve) hours as  needed for moderate pain or severe pain.   0 09/21/2017 at Unknown time  . traZODone (DESYREL) 100 MG tablet Take 1 tablet (100 mg total) by mouth at bedtime. 90 tablet 0 09/21/2017 at Unknown time    Assessment: 27 yo male seen in the ED for severe abdominal pain and SOB. Radiology results show acute pulmonary embolism. Pharmacy has been consulted for IV heparin dosing.  Heparin level therapeutic at 0.36   Goal of Therapy:  Heparin level 0.3-0.7 units/ml Monitor platelets by anticoagulation protocol: Yes   Plan:  Continue heparin infusion at 1800 units/hr Check anti-Xa level daily while on heparin Continue to monitor H&H and platelets   Casy Brunetto A. Jeanella Craze, PharmD, BCPS Clinical Pharmacist [place] Pager: [phone] Please utilize Amion for appropriate phone number to reach the unit pharmacist [facility])   09/24/2017 11:29 AM

## 2017-09-24 NOTE — Progress Notes (Signed)
MD notified that patient's HR is in high 120s and low 130s. Patient is asymptomatic, still complaining of abdominal pain; he is ambulating in his room. Will continue to monitor.

## 2017-09-24 NOTE — Progress Notes (Signed)
Progress Note  Patient Name: Philip Thompson Date of Encounter: 09/24/2017  Primary Cardiologist:   No primary care provider on file.   Subjective   Episode of chest pain last night and given NTG. No chest pain now .  The pain is in his back and abdomen.    Inpatient Medications    Scheduled Meds: . levothyroxine  37.5 mcg Intravenous Daily  . mouth rinse  15 mL Mouth Rinse BID  . milk and molasses  1 enema Rectal Once  . nicotine  21 mg Transdermal Daily  . sodium chloride flush  3 mL Intravenous Q12H   Continuous Infusions: . sodium chloride 100 mL/hr at 09/24/17 0416  . heparin 1,800 Units/hr (09/23/17 2107)   PRN Meds: acetaminophen **OR** acetaminophen, ketorolac, LORazepam, nitroGLYCERIN, ondansetron **OR** ondansetron (ZOFRAN) IV   Vital Signs    Vitals:   09/24/17 0015 09/24/17 0408 09/24/17 0802 09/24/17 0900  BP: (!) 126/95 (!) 134/101 (!) 134/101 (!) 128/111  Pulse: (!) 132 (!) 105 (!) 101 (!) 110  Resp: (!) 23 11 16 19   Temp: 97.6 F (36.4 C) 97.8 F (36.6 C) (!) 97.5 F (36.4 C) 97.9 F (36.6 C)  TempSrc: Oral Oral Oral Oral  SpO2: 93% 100% 99% 96%  Weight:  94.7 kg    Height:        Intake/Output Summary (Last 24 hours) at 09/24/2017 1101 Last data filed at 09/24/2017 0307 Gross per 24 hour  Intake 1263.46 ml  Output -  Net 1263.46 ml   Filed Weights   09/22/17 0128 09/24/17 0408  Weight: 93.8 kg 94.7 kg    Telemetry    NSR, sinus tach- Personally Reviewed  ECG    Sinus tach, rate 109, no acute ST T wave changes - Personally Reviewed  Physical Exam   GEN:   Acute abdominal and back pain Neck:   Patient standing up.  Unable to assess.  Respiratory: Clear  GI:   Distended and decreased BS MS:n  Severe  edema; No deformity. Neuro:  Nonfocal  Psych: Normal affect   Labs    Chemistry Recent Labs  Lab 09/21/17 2237 09/22/17 0553 09/23/17 0529 09/24/17 0016  NA 139 139 139 140  K 3.7 4.0 3.4* 3.2*  CL 103 105 104 102  CO2  27 27 25 28   GLUCOSE 105* 111* 91 108*  BUN 10 10 11 11   CREATININE 0.66 0.58* 0.64 0.80  CALCIUM 9.9 9.0 9.1 9.5  PROT 8.2*  --   --  6.5  ALBUMIN 4.3  --   --  3.6  AST 18  --   --  15  ALT 13  --   --  13  ALKPHOS 92  --   --  74  BILITOT 0.8  --   --  0.6  GFRNONAA >60 >60 >60 >60  GFRAA >60 >60 >60 >60  ANIONGAP 9 7 10 10      Hematology Recent Labs  Lab 09/21/17 2237 09/23/17 0529 09/24/17 0016  WBC 12.3* 5.9 7.3  RBC 5.17 4.43 4.67  HGB 14.8 12.3* 12.8*  HCT 43.5 37.4* 39.6  MCV 84.1 84.4 84.8  MCH 28.6 27.8 27.4  MCHC 34.0 32.9 32.3  RDW 14.7 14.4 14.2  PLT 170 136* 165    Cardiac Enzymes Recent Labs  Lab 09/22/17 0212 09/24/17 0016  TROPONINI <0.03 <0.03   No results for input(s): TROPIPOC in the last 168 hours.   BNPNo results for input(s): BNP, PROBNP in  the last 168 hours.   DDimer No results for input(s): DDIMER in the last 168 hours.   Radiology    No results found.  Cardiac Studies   ECHO:  Study Conclusions  - Left ventricle: The cavity size was normal. Wall thickness was   normal. Systolic function was normal. The estimated ejection   fraction was in the range of 55% to 60%. - Pericardium, extracardiac: A trivial pericardial effusion was   identified.   Patient Profile     27 y.o. male with a hx of ADD, anxiety, depression, polysubstance abuse, transient cardiomyopathy 2015 in setting of acute intoxication/VDRF who is being seen for the evaluation of neostigmine administration at the request of Dr. Lovell Sheehan.  Assessment & Plan    CHEST PAIN:  Non anginal   OGILVIE SYNDROME:  Plan is to consider neostigmine.  Tele without arrhythmias.  There are no cardiac contraindications to neostigmine.  However, there are the known and listed possible cardiac complications which includes asystole and bradycardia and he and his family need to be aware and there needs to continue to be monitoring.    Risks need to be weighted against the benefits  per the GI and primary team.   ACUTE BILATERAL PE:  Continue IV heparin.      For questions or updates, please contact [facility] Please consult [email or web address] for contact info under Cardiology/STEMI.   Signed, Rollene Rotunda, MD  09/24/2017, 11:01 AM

## 2017-09-25 LAB — CBC
HCT: 36 % — ABNORMAL LOW (ref 39.0–52.0)
Hemoglobin: 11.7 g/dL — ABNORMAL LOW (ref 13.0–17.0)
MCH: 27.5 pg (ref 26.0–34.0)
MCHC: 32.5 g/dL (ref 30.0–36.0)
MCV: 84.7 fL (ref 78.0–100.0)
Platelets: 128 10*3/uL — ABNORMAL LOW (ref 150–400)
RBC: 4.25 MIL/uL (ref 4.22–5.81)
RDW: 14.2 % (ref 11.5–15.5)
WBC: 5.9 10*3/uL (ref 4.0–10.5)

## 2017-09-25 LAB — BASIC METABOLIC PANEL
Anion gap: 9 (ref 5–15)
BUN: 11 mg/dL (ref 6–20)
CHLORIDE: 106 mmol/L (ref 98–111)
CO2: 24 mmol/L (ref 22–32)
CREATININE: 0.64 mg/dL (ref 0.61–1.24)
Calcium: 9.1 mg/dL (ref 8.9–10.3)
Glucose, Bld: 110 mg/dL — ABNORMAL HIGH (ref 70–99)
POTASSIUM: 3.4 mmol/L — AB (ref 3.5–5.1)
SODIUM: 139 mmol/L (ref 135–145)

## 2017-09-25 LAB — HEPARIN LEVEL (UNFRACTIONATED)
HEPARIN UNFRACTIONATED: 0.28 [IU]/mL — AB (ref 0.30–0.70)
HEPARIN UNFRACTIONATED: 0.35 [IU]/mL (ref 0.30–0.70)
HEPARIN UNFRACTIONATED: 0.42 [IU]/mL (ref 0.30–0.70)

## 2017-09-25 MED ORDER — ACETAMINOPHEN 10 MG/ML IV SOLN
1000.0000 mg | Freq: Four times a day (QID) | INTRAVENOUS | Status: AC
  Administered 2017-09-25 – 2017-09-26 (×4): 1000 mg via INTRAVENOUS
  Filled 2017-09-25 (×5): qty 100

## 2017-09-25 NOTE — Progress Notes (Signed)
ANTICOAGULATION CONSULT NOTE  Pharmacy Consult for heparin Indication: pulmonary embolus  No Known Allergies  Patient Measurements: Height: 5\' 8"  (172.7 cm) Weight: 218 lb 14.7 oz (99.3 kg) IBW/kg (Calculated) : 68.4 Heparin Dosing Weight: 88 kg  Vital Signs: Temp: 98.5 F (36.9 C) (09/08 0803) Temp Source: Oral (09/08 0803) BP: 123/88 (09/08 0803) Pulse Rate: 106 (09/08 0803)  Labs: Recent Labs    09/23/17 0529  09/24/17 0016 09/24/17 1135 09/25/17 0251 09/25/17 1022  HGB 12.3*  --  12.8*  --  11.7*  --   HCT 37.4*  --  39.6  --  36.0*  --   PLT 136*  --  165  --  128*  --   HEPARINUNFRC 0.14*   < > 0.36  --  0.28* 0.35  CREATININE 0.64  --  0.80 0.81 0.64  --   TROPONINI  --   --  <0.03  --   --   --    < > = values in this interval not displayed.    Estimated Creatinine Clearance: 158.5 mL/min (by C-G formula based on SCr of 0.64 mg/dL).   Medical History: Past Medical History:  Diagnosis Date  . ADD (attention deficit disorder)   . Aggression   . Allergic rhinitis   . Anxiety   . Arthritis    joint pain  . Cardiomyopathy (HCC)    a. EF <25% in 2015 during admission for multi substance intoxication/respiratory failure requiring intubation.  . Chronic back pain   . Constipation   . Depression   . Fecal obstruction (HCC)   . Frontal head injury   . Heroin overdose (HCC)   . Obstipation   . Polysubstance abuse (HCC)   . Suicide attempt [facility])     Medications:  Medications Prior to Admission  Medication Sig Dispense Refill Last Dose  . busPIRone (BUSPAR) 15 MG tablet Take 1 tablet (15 mg total) by mouth 3 (three) times daily. 90 tablet 3 09/21/2017 at Unknown time  . citalopram (CELEXA) 40 MG tablet Take 1 tablet (40 mg total) by mouth daily. 90 tablet 0 09/21/2017 at Unknown time  . cyclobenzaprine (FLEXERIL) 10 MG tablet Take 1 tablet (10 mg total) by mouth 3 (three) times daily as needed for muscle spasms. 30 tablet 0 Past Week at Unknown time  .  divalproex (DEPAKOTE) 500 MG DR tablet Take 1 tablet (500 mg total) by mouth every 12 (twelve) hours. For mood stabilization 180 tablet 0 09/21/2017 at Unknown time  . docusate sodium (COLACE) 100 MG capsule Take 1 capsule (100 mg total) by mouth 2 (two) times daily. (Patient taking differently: Take 100 mg by mouth daily. ) 60 capsule 0 09/21/2017 at Unknown time  . furosemide (LASIX) 20 MG tablet Take 1 tablet (20 mg total) by mouth daily. Prn leg swelling (Patient taking differently: Take 20 mg by mouth daily as needed for fluid or edema. As needed for leg swelling) 30 tablet 2 unknown  . gabapentin (NEURONTIN) 300 MG capsule Take 2 capsules (600 mg total) by mouth 3 (three) times daily. For agitation 540 capsule 0 Past Week at Unknown time  . ibuprofen (ADVIL,MOTRIN) 600 MG tablet Take 1 tablet (600 mg total) by mouth every 6 (six) hours as needed for moderate pain. (May buy from over the counter): For pain 1 tablet 0 Past Month at Unknown time  . levothyroxine (SYNTHROID) 50 MCG tablet Take 1 tablet (50 mcg total) by mouth daily before breakfast. 30 tablet 1 09/21/2017  at Unknown time  . linaclotide (LINZESS) 290 MCG CAPS capsule Take 1 capsule (290 mcg total) by mouth daily before breakfast. 30 capsule 1 09/21/2017 at Unknown time  . potassium chloride (K-DUR,KLOR-CON) 10 MEQ tablet Take 1 tablet (10 mEq total) by mouth daily. With furosemide (Patient taking differently: Take 10 mEq by mouth daily as needed (only takes when Lasix (Furosemide)). With furosemide) 30 tablet 2 unknown  . QUEtiapine (SEROQUEL) 400 MG tablet Take 2 tablets (800 mg total) by mouth at bedtime. 180 tablet 0 09/21/2017 at Unknown time  . traMADol (ULTRAM) 50 MG tablet Take 50 mg by mouth every 12 (twelve) hours as needed for moderate pain or severe pain.   0 09/21/2017 at Unknown time  . traZODone (DESYREL) 100 MG tablet Take 1 tablet (100 mg total) by mouth at bedtime. 90 tablet 0 09/21/2017 at Unknown time    Assessment: 27 yo male  seen in the ED for severe abdominal pain and SOB. Radiology results show acute pulmonary embolism. Pharmacy has been consulted for IV heparin dosing.  Heparin level therapeutic at 0.35 but at lower end of range.   Goal of Therapy:  Heparin level 0.3-0.7 units/ml Monitor platelets by anticoagulation protocol: Yes   Plan:  Increase heparin infusion to 2050 units/hr Confirmatory heparin level in 6 hours Check anti-Xa level daily while on heparin Continue to monitor H&H and platelets   Yaritzy Huser A. Jeanella Craze, PharmD, BCPS Clinical Pharmacist [place] Pager: [phone] Please utilize Amion for appropriate phone number to reach the unit pharmacist [facility])   09/25/2017 11:42 AM

## 2017-09-25 NOTE — Progress Notes (Signed)
ANTICOAGULATION CONSULT NOTE - Follow Up Consult  Pharmacy Consult for heparin Indication: pulmonary embolus  Labs: Recent Labs    09/23/17 0529  09/23/17 2138 09/24/17 0016 09/24/17 1135 09/25/17 0251  HGB 12.3*  --   --  12.8*  --  11.7*  HCT 37.4*  --   --  39.6  --  36.0*  PLT 136*  --   --  165  --  128*  HEPARINUNFRC 0.14*   < > 0.33 0.36  --  0.28*  CREATININE 0.64  --   --  0.80 0.81 0.64  TROPONINI  --   --   --  <0.03  --   --    < > = values in this interval not displayed.    Assessment: 27yo male now subtherapeutic on heparin after two levels at lower end of goal, would prefer higher levels with bilateral PE.  Goal of Therapy:  Heparin level 0.3-0.7 units/ml   Plan:  Will increase heparin gtt by 10% to 2000 units/hr and check level in 6 hours.    Vernard Gambles, PharmD, BCPS 09/25/2017 3:53 AM

## 2017-09-25 NOTE — Progress Notes (Signed)
Progress Note  Patient Name: Philip Thompson Date of Encounter: 09/25/2017  Primary Cardiologist:   No primary care provider on file.   Subjective   Back and abdominal pain not any better than previous.    Inpatient Medications    Scheduled Meds: . levothyroxine  37.5 mcg Intravenous Daily  . mouth rinse  15 mL Mouth Rinse BID  . milk and molasses  1 enema Rectal Once  . nicotine  21 mg Transdermal Daily  . sodium chloride flush  3 mL Intravenous Q12H   Continuous Infusions: . sodium chloride 1,000 mL (09/25/17 0912)  . heparin 2,000 Units/hr (09/25/17 0549)   PRN Meds: ketorolac, LORazepam, nitroGLYCERIN, ondansetron **OR** ondansetron (ZOFRAN) IV   Vital Signs    Vitals:   09/24/17 2355 09/25/17 0425 09/25/17 0700 09/25/17 0803  BP: (!) 135/109 112/76  123/88  Pulse: (!) 122 (!) 101  (!) 106  Resp: (!) 21 15  17   Temp: 97.7 F (36.5 C) 98.1 F (36.7 C)  98.5 F (36.9 C)  TempSrc: Oral Oral  Oral  SpO2: 90% 96%  94%  Weight:   99.3 kg   Height:        Intake/Output Summary (Last 24 hours) at 09/25/2017 0937 Last data filed at 09/25/2017 0912 Gross per 24 hour  Intake 2243.17 ml  Output -  Net 2243.17 ml   Filed Weights   09/22/17 0128 09/24/17 0408 09/25/17 0700  Weight: 93.8 kg 94.7 kg 99.3 kg    Telemetry    Sinus tach- Personally Reviewed  ECG    NA - Personally Reviewed  Physical Exam   GEN: No  acute distress.   Neck: No  JVD Cardiac: RRR, no murmurs, rubs, or gallops.  Respiratory: Clear  to auscultation bilaterally. GI:    Distended  MS:  Massive edema; No deformity. Neuro:   Nonfocal  Psych: Oriented and appropriate     Labs    Chemistry Recent Labs  Lab 09/21/17 2237  09/24/17 0016 09/24/17 1135 09/25/17 0251  NA 139   < > 140 138 139  K 3.7   < > 3.2* 3.5 3.4*  CL 103   < > 102 104 106  CO2 27   < > 28 26 24   GLUCOSE 105*   < > 108* 102* 110*  BUN 10   < > 11 11 11   CREATININE 0.66   < > 0.80 0.81 0.64  CALCIUM  9.9   < > 9.5 9.2 9.1  PROT 8.2*  --  6.5  --   --   ALBUMIN 4.3  --  3.6  --   --   AST 18  --  15  --   --   ALT 13  --  13  --   --   ALKPHOS 92  --  74  --   --   BILITOT 0.8  --  0.6  --   --   GFRNONAA >60   < > >60 >60 >60  GFRAA >60   < > >60 >60 >60  ANIONGAP 9   < > 10 8 9    < > = values in this interval not displayed.     Hematology Recent Labs  Lab 09/23/17 0529 09/24/17 0016 09/25/17 0251  WBC 5.9 7.3 5.9  RBC 4.43 4.67 4.25  HGB 12.3* 12.8* 11.7*  HCT 37.4* 39.6 36.0*  MCV 84.4 84.8 84.7  MCH 27.8 27.4 27.5  MCHC 32.9 32.3 32.5  RDW 14.4 14.2 14.2  PLT 136* 165 128*    Cardiac Enzymes Recent Labs  Lab 09/22/17 0212 09/24/17 0016  TROPONINI <0.03 <0.03   No results for input(s): TROPIPOC in the last 168 hours.   BNPNo results for input(s): BNP, PROBNP in the last 168 hours.   DDimer No results for input(s): DDIMER in the last 168 hours.   Radiology    No results found.  Cardiac Studies   ECHO:  Study Conclusions  - Left ventricle: The cavity size was normal. Wall thickness was   normal. Systolic function was normal. The estimated ejection   fraction was in the range of 55% to 60%. - Pericardium, extracardiac: A trivial pericardial effusion was   identified.   Patient Profile     27 y.o. male with a hx of ADD, anxiety, depression, polysubstance abuse, transient cardiomyopathy 2015 in setting of acute intoxication/VDRF who is being seen for the evaluation of neostigmine administration at the request of Dr. Lovell Sheehan.  Assessment & Plan    CHEST PAIN:  Non anginal.  No further cardiac work up.    OGILVIE SYNDROME:  Plan for neostigmine is on hold.    ACUTE BILATERAL PE:  Continue IV heparin.  Eventually will need DOAC.    We will see as needed.     For questions or updates, please contact [facility] Please consult [email or web address] for contact info under Cardiology/STEMI.   Signed, Rollene Rotunda, MD  09/25/2017, 9:37 AM

## 2017-09-25 NOTE — Progress Notes (Signed)
[facility] Progress Note  Subjective: The patient was seen today. He still complains of abdominal distention and discomfort in the abdomen. He is requesting more stronger medication for the abdominal pain. He refused neostigmine administration to try to decompress the colon. I spoke to him today about colonoscopy to try to put a scope in the colon to suction out the air and hopefully decompress his colon, but he refuses that also. I told him that we were just trying to help to decompress the colon so that it would not perforate. He tells me he's had this problem all his life. He just wants a long-term fix which he thinks is going to be the colectomy. I told him that in the short-term we were trying to decompress the colon which would help his pain and hopefully keep him from perforating, but he is refusing both neostigmine and colonoscopic decompression  Objective: Vital signs in last 24 hours: Temp:  [97.7 F (36.5 C)-98.6 F (37 C)] 98.5 F (36.9 C) (09/08 0803) Pulse Rate:  [101-132] 106 (09/08 0803) Resp:  [15-21] 17 (09/08 0803) BP: (112-142)/(76-109) 123/88 (09/08 0803) SpO2:  [90 %-98 %] 94 % (09/08 0803) Weight:  [99.3 kg] 99.3 kg (09/08 0700) Weight change: 4.6 kg   PE:  Alert and oriented  Abdomen markedly distended and firm  Lab Results: Results for orders placed or performed during the hospital encounter of 09/21/17 (from the past 24 hour(s))  Magnesium     Status: None   Collection Time: 09/24/17 11:35 AM  Result Value Ref Range   Magnesium 1.7 1.7 - 2.4 mg/dL  Phosphorus     Status: None   Collection Time: 09/24/17 11:35 AM  Result Value Ref Range   Phosphorus 3.7 2.5 - 4.6 mg/dL  Basic metabolic panel     Status: Abnormal   Collection Time: 09/24/17 11:35 AM  Result Value Ref Range   Sodium 138 135 - 145 mmol/L   Potassium 3.5 3.5 - 5.1 mmol/L   Chloride 104 98 - 111 mmol/L   CO2 26 22 - 32 mmol/L   Glucose, Bld 102 (H) 70 - 99 mg/dL   BUN 11 6 -  20 mg/dL   Creatinine, Ser 0.60 0.61 - 1.24 mg/dL   Calcium 9.2 8.9 - 15.6 mg/dL   GFR calc non Af Amer >60 >60 mL/min   GFR calc Af Amer >60 >60 mL/min   Anion gap 8 5 - 15  CBC     Status: Abnormal   Collection Time: 09/25/17  2:51 AM  Result Value Ref Range   WBC 5.9 4.0 - 10.5 K/uL   RBC 4.25 4.22 - 5.81 MIL/uL   Hemoglobin 11.7 (L) 13.0 - 17.0 g/dL   HCT 15.3 (L) 79.4 - 32.7 %   MCV 84.7 78.0 - 100.0 fL   MCH 27.5 26.0 - 34.0 pg   MCHC 32.5 30.0 - 36.0 g/dL   RDW 61.4 70.9 - 29.5 %   Platelets 128 (L) 150 - 400 K/uL  Heparin level (unfractionated)     Status: Abnormal   Collection Time: 09/25/17  2:51 AM  Result Value Ref Range   Heparin Unfractionated 0.28 (L) 0.30 - 0.70 IU/mL  Basic metabolic panel     Status: Abnormal   Collection Time: 09/25/17  2:51 AM  Result Value Ref Range   Sodium 139 135 - 145 mmol/L   Potassium 3.4 (L) 3.5 - 5.1 mmol/L   Chloride 106 98 - 111 mmol/L   CO2  24 22 - 32 mmol/L   Glucose, Bld 110 (H) 70 - 99 mg/dL   BUN 11 6 - 20 mg/dL   Creatinine, Ser 0.86 0.61 - 1.24 mg/dL   Calcium 9.1 8.9 - 57.8 mg/dL   GFR calc non Af Amer >60 >60 mL/min   GFR calc Af Amer >60 >60 mL/min   Anion gap 9 5 - 15    Studies/Results: No results found.    Assessment: Ogilvie syndrome  Plan:   As mentioned above the patient is refusing neostigmine as well as colonoscopic decompression.    SAM F Cheria Sadiq 09/25/2017, 11:09 AM  Pager: [phone] If no answer or after 5 PM call [phone]

## 2017-09-25 NOTE — Progress Notes (Signed)
I came back this afternoon to speak to the patient again and his mother who is present. I explained colonic decompression. They are concerned about the risks of the procedure which include bleeding, infection, and perforation. They both are still unsure of what course to take, and at this time still will not consent to colonoscopy and decompression. They are going to think about it however.

## 2017-09-25 NOTE — Progress Notes (Signed)
PROGRESS NOTE  Philip Thompson RUE:[id number] DOB: 1990-07-16 DOA: 09/21/2017 PCP: [name], MD  Brief Narrative: 27 year old man PMH polysubstance abuse in remission, major depression, hypothyroidism, chronic constipation presenting with abdominal pain, massive abdominal distention, nausea, vomiting.  Known to have megacolon with massive stool retention unrelieved by Gastrografin enema.  Admitted to [facility] 9/5 for bilateral pulmonary emboli, acute hypoxic respiratory failure, Ogilvie syndrome with massive retention of stool.  GI recommended neostigmine.  Cardiology felt it best to transfer to [facility] for closer monitoring and cardiology support.  General surgery recommended colectomy but at this time wishes to defer until next week given acute PE.  Assessment/Plan Refractory megacolon, Ogilvie syndrome.  Failed maximal medical therapy.  GI recommended neostigmine 2 mg IV via slow 5-minute push on September 5.  Per that note, this needs to be given with cardiac monitoring watching for bradycardia and transient asystole.  Cardiology saw patient September 6 and recommended transfer to [facility] for access to cardiology there given potential for bradycardia or ventricular arrhythmias, especially in the setting of bilateral pulmonary embolism.  Although intensivist was repeatedly mentioned in the chart, the patient was transferred to the hospitalist service given clinical stability. --Discussed with Dr. Evette Cristal.  Offered decompression colonoscopy, patient refused. --Patient in significant pain unrelieved with ketorolac. Will try IV acetaminophen and avoid narcotics if possible. Discussed with Dr. Evette Cristal, can give morphine if needed.   Bilateral pulmonary emboli with acute hypoxic respiratory failure.  Thought secondary to impediment and venous return.  No evidence of heart strain by CT or echocardiogram.  Bilateral lower extremity venous Dopplers negative.. EKG 9/6 ST. --Oxygenation stable on  nasal cannula 3 L.  Continue heparin infusion  Massive bilateral lower extremity edema.  Venous Dopplers negative.  Likely secondary to impeded venous return from massive colonic distention.  Emphysema, subclinical.  CT suggested opacity airspace disease which in this clinical context may be related to infarcts.  No evidence clinically of pneumonia.  Hypothyroidism, started on Synthroid 50 mcg on discharge 9/2. --Continue levothyroxine  PMH ADD, anxiety, major depressive disorder, polysubstance abuse,  --Citalopram, Depakote, quetiapine, trazodone, buspirone on hold  Chronic back pain.  Gabapentin and cyclobenzaprine on hold.  DVT prophylaxis:  Heparin infusion Code Status: Full Family Communication: none Disposition Plan: home    Brendia Sacks, MD  [facility] Direct contact: [phone] --Via amion app OR  --[email or web address]; password [email or web address]  7PM-7AM contact night coverage as above 09/25/2017, 10:53 AM  LOS: 3 days   Consultants:  GI  General surgery  Cardiology   Procedures:  Echo Study Conclusions  - Left ventricle: The cavity size was normal. Wall thickness was   normal. Systolic function was normal. The estimated ejection   fraction was in the range of 55% to 60%. - Pericardium, extracardiac: A trivial pericardial effusion was   identified.  Antimicrobials:    Interval history/Subjective: "I feel like I'm on my deathbed." pain severe and unrelieved. No BM. No vomiting.  Objective: Vitals:  Vitals:   09/25/17 0425 09/25/17 0803  BP: 112/76 123/88  Pulse: (!) 101 (!) 106  Resp: 15 17  Temp: 98.1 F (36.7 C) 98.5 F (36.9 C)  SpO2: 96% 94%    Exam: Constitutional:   . Appears calm, ill, very uncomfortable but non-toxic ENMT:  . grossly normal hearing  Respiratory:  . CTA bilaterally, no w/r/r.  . Respiratory effort normal.  Cardiovascular:  . RRR, no m/r/g . No change in 3+ BLE extremity edema   Abdomen:  .  Massively distended,  decreased bowel sounds Psychiatric:  . Mental status o Mood, affect appropriate  I have personally reviewed the following:   Labs: o Potassium 3.4, remainder BMP unremarkable.  Platelets 128.  Scheduled Meds: . levothyroxine  37.5 mcg Intravenous Daily  . mouth rinse  15 mL Mouth Rinse BID  . milk and molasses  1 enema Rectal Once  . nicotine  21 mg Transdermal Daily  . sodium chloride flush  3 mL Intravenous Q12H   Continuous Infusions: . sodium chloride 1,000 mL (09/25/17 0912)  . heparin 2,000 Units/hr (09/25/17 0549)    Principal Problem:   Ogilvie's syndrome Active Problems:   MDD (major depressive disorder), recurrent severe, without psychosis (HCC)   Leg edema   Fecal impaction (HCC)   Acute respiratory failure with hypoxia (HCC)   Pulmonary embolism, bilateral (HCC)   Hypothyroidism   Colon distention   LOS: 3 days

## 2017-09-25 NOTE — Progress Notes (Signed)
[facility]/Trauma Progress Note      Assessment/Plan Principal Problem:   Ogilvie's syndrome Active Problems:   MDD (major depressive disorder), recurrent severe, without psychosis (HCC)   Leg edema   Fecal impaction (HCC)   Acute respiratory failure with hypoxia (HCC)   Pulmonary embolism, bilateral (HCC)   Hypothyroidism   Colon distention  Megacolon with rectal fecal impaction- Colonic inertia versus short segment Hirschsprung's disease vs patient has a long-standing history of chronic narcotic abuse and chronic substance abuse. This certainly can lead to this condition. He has a long-standing history of chronic constipation. Ultimately he would benefit from total abdominal colectomy with ileostomy. His rectal stump will be too large for anastomosis and we need time to decompress.  Acute bilateral pulmonary emboli- Any surgical intervention in the next few days is ill advised. He has significant cardiovascular risk given his large pulmonary embolus and this needs time to mature. Appreciate GI consultation considering neostigmine vs short term endoscopic decompression. This is a chronic long-standing problem therefore risk of perforation is less compared to acute megacolon. His white count is normal. He may feel better with decompression. Neostigmine can be used but I doubt it will be very helpful in this circumstance since this is a chronic long-term problem with severe colonic megacolon and inertia. We will follow for now. Ideally, total abdominal colectomy next week after at least having on heparin for 5 to 7 days would be beneficial.  We will follow  FEN: NPO VTE: SCD's, heparin ID: none Foley: none Follow up:  TBD    LOS: 3 days    Subjective: CC: Abdominal pain and bloating  I woke pt up. He states his pain is not too bad. His pain and bloating is not worse than yesterday. No nausea or vomiting overnight. No fever or chills. No new complaints.    Objective: Vital signs in last 24 hours: Temp:  [97.7 F (36.5 C)-98.6 F (37 C)] 98.5 F (36.9 C) (09/08 0803) Pulse Rate:  [101-132] 106 (09/08 0803) Resp:  [15-21] 17 (09/08 0803) BP: (112-142)/(76-111) 123/88 (09/08 0803) SpO2:  [90 %-98 %] 94 % (09/08 0803) Weight:  [99.3 kg] 99.3 kg (09/08 0700) Last BM Date: 08/18/17  Intake/Output from previous day: 09/07 0701 - 09/08 0700 In: 2032.5 [I.V.:2032.5] Out: -  Intake/Output this shift: No intake/output data recorded.  PE: General appearance: somnolent and chronically ill/ cachectic Resp: rate and effort normal  GI: very distended, firm, +BS, mild generalized TTP Skin: No rashes noted, warm and dry Neurologic: Grossly normal  Anti-infectives: Anti-infectives (From admission, onward)   None      Lab Results:  Recent Labs    09/24/17 0016 09/25/17 0251  WBC 7.3 5.9  HGB 12.8* 11.7*  HCT 39.6 36.0*  PLT 165 128*   BMET Recent Labs    09/24/17 1135 09/25/17 0251  NA 138 139  K 3.5 3.4*  CL 104 106  CO2 26 24  GLUCOSE 102* 110*  BUN 11 11  CREATININE 0.81 0.64  CALCIUM 9.2 9.1   PT/INR No results for input(s): LABPROT, INR in the last 72 hours. CMP     Component Value Date/Time   NA 139 09/25/2017 0251   K 3.4 (L) 09/25/2017 0251   CL 106 09/25/2017 0251   CO2 24 09/25/2017 0251   GLUCOSE 110 (H) 09/25/2017 0251   BUN 11 09/25/2017 0251   CREATININE 0.64 09/25/2017 0251   CALCIUM 9.1 09/25/2017 0251   PROT 6.5 09/24/2017 0016  ALBUMIN 3.6 09/24/2017 0016   AST 15 09/24/2017 0016   ALT 13 09/24/2017 0016   ALKPHOS 74 09/24/2017 0016   BILITOT 0.6 09/24/2017 0016   GFRNONAA >60 09/25/2017 0251   GFRAA >60 09/25/2017 0251   Lipase     Component Value Date/Time   LIPASE 37 09/21/2017 2237    Studies/Results: No results found.    Jerre Simon , [facility] 09/25/2017, 8:56 AM  Pager: [phone] Mon-Wed, Friday 7:00am-4:30pm Thurs 7am-11:30am  Consults:  [phone]

## 2017-09-25 NOTE — Progress Notes (Signed)
ANTICOAGULATION CONSULT NOTE  Pharmacy Consult for heparin Indication: pulmonary embolus  No Known Allergies  Patient Measurements: Height: 5\' 8"  (172.7 cm) Weight: 218 lb 14.7 oz (99.3 kg) IBW/kg (Calculated) : 68.4 Heparin Dosing Weight: 88 kg  Vital Signs: Temp: 97.8 F (36.6 C) (09/08 1947) Temp Source: Oral (09/08 1947) BP: 109/72 (09/08 1947) Pulse Rate: 130 (09/08 1947)  Labs: Recent Labs    09/23/17 0529  09/24/17 0016 09/24/17 1135 09/25/17 0251 09/25/17 1022 09/25/17 1856  HGB 12.3*  --  12.8*  --  11.7*  --   --   HCT 37.4*  --  39.6  --  36.0*  --   --   PLT 136*  --  165  --  128*  --   --   HEPARINUNFRC 0.14*   < > 0.36  --  0.28* 0.35 0.42  CREATININE 0.64  --  0.80 0.81 0.64  --   --   TROPONINI  --   --  <0.03  --   --   --   --    < > = values in this interval not displayed.    Estimated Creatinine Clearance: 158.5 mL/min (by C-G formula based on SCr of 0.64 mg/dL).   Medical History: Past Medical History:  Diagnosis Date  . ADD (attention deficit disorder)   . Aggression   . Allergic rhinitis   . Anxiety   . Arthritis    joint pain  . Cardiomyopathy (HCC)    a. EF <25% in 2015 during admission for multi substance intoxication/respiratory failure requiring intubation.  . Chronic back pain   . Constipation   . Depression   . Fecal obstruction (HCC)   . Frontal head injury   . Heroin overdose (HCC)   . Obstipation   . Polysubstance abuse (HCC)   . Suicide attempt [facility])     Medications:  Medications Prior to Admission  Medication Sig Dispense Refill Last Dose  . busPIRone (BUSPAR) 15 MG tablet Take 1 tablet (15 mg total) by mouth 3 (three) times daily. 90 tablet 3 09/21/2017 at Unknown time  . citalopram (CELEXA) 40 MG tablet Take 1 tablet (40 mg total) by mouth daily. 90 tablet 0 09/21/2017 at Unknown time  . cyclobenzaprine (FLEXERIL) 10 MG tablet Take 1 tablet (10 mg total) by mouth 3 (three) times daily as needed for muscle spasms. 30  tablet 0 Past Week at Unknown time  . divalproex (DEPAKOTE) 500 MG DR tablet Take 1 tablet (500 mg total) by mouth every 12 (twelve) hours. For mood stabilization 180 tablet 0 09/21/2017 at Unknown time  . docusate sodium (COLACE) 100 MG capsule Take 1 capsule (100 mg total) by mouth 2 (two) times daily. (Patient taking differently: Take 100 mg by mouth daily. ) 60 capsule 0 09/21/2017 at Unknown time  . furosemide (LASIX) 20 MG tablet Take 1 tablet (20 mg total) by mouth daily. Prn leg swelling (Patient taking differently: Take 20 mg by mouth daily as needed for fluid or edema. As needed for leg swelling) 30 tablet 2 unknown  . gabapentin (NEURONTIN) 300 MG capsule Take 2 capsules (600 mg total) by mouth 3 (three) times daily. For agitation 540 capsule 0 Past Week at Unknown time  . ibuprofen (ADVIL,MOTRIN) 600 MG tablet Take 1 tablet (600 mg total) by mouth every 6 (six) hours as needed for moderate pain. (May buy from over the counter): For pain 1 tablet 0 Past Month at Unknown time  . levothyroxine (SYNTHROID)  50 MCG tablet Take 1 tablet (50 mcg total) by mouth daily before breakfast. 30 tablet 1 09/21/2017 at Unknown time  . linaclotide (LINZESS) 290 MCG CAPS capsule Take 1 capsule (290 mcg total) by mouth daily before breakfast. 30 capsule 1 09/21/2017 at Unknown time  . potassium chloride (K-DUR,KLOR-CON) 10 MEQ tablet Take 1 tablet (10 mEq total) by mouth daily. With furosemide (Patient taking differently: Take 10 mEq by mouth daily as needed (only takes when Lasix (Furosemide)). With furosemide) 30 tablet 2 unknown  . QUEtiapine (SEROQUEL) 400 MG tablet Take 2 tablets (800 mg total) by mouth at bedtime. 180 tablet 0 09/21/2017 at Unknown time  . traMADol (ULTRAM) 50 MG tablet Take 50 mg by mouth every 12 (twelve) hours as needed for moderate pain or severe pain.   0 09/21/2017 at Unknown time  . traZODone (DESYREL) 100 MG tablet Take 1 tablet (100 mg total) by mouth at bedtime. 90 tablet 0 09/21/2017 at  Unknown time    Assessment: 27 yo male seen in the ED for severe abdominal pain and SOB. Radiology results show acute pulmonary embolism. Pharmacy has been consulted for IV heparin dosing.  Heparin level therapeutic at 0.42.    Goal of Therapy:  Heparin level 0.3-0.7 units/ml Monitor platelets by anticoagulation protocol: Yes   Plan:  Continue heparin infusion at 2050 units/hr Check anti-Xa level daily while on heparin Continue to monitor H&H and platelets   Arah Aro A. Jeanella Craze, PharmD, BCPS Clinical Pharmacist [place] Pager: [phone] Please utilize Amion for appropriate phone number to reach the unit pharmacist [facility])   09/25/2017 7:54 PM

## 2017-09-25 NOTE — Progress Notes (Addendum)
Ffamily called nurses in patient's room and asked to talk with his doctor. They said that the patient is in a lot of pain (even with the pain medicine scheduled and PRN) and they asked to do something for him. MD notified. Will continue to monitor.

## 2017-09-26 DIAGNOSIS — Z01810 Encounter for preprocedural cardiovascular examination: Secondary | ICD-10-CM

## 2017-09-26 LAB — PHOSPHORUS: Phosphorus: 4.3 mg/dL (ref 2.5–4.6)

## 2017-09-26 LAB — BASIC METABOLIC PANEL
Anion gap: 10 (ref 5–15)
BUN: 11 mg/dL (ref 6–20)
CALCIUM: 8.9 mg/dL (ref 8.9–10.3)
CHLORIDE: 106 mmol/L (ref 98–111)
CO2: 23 mmol/L (ref 22–32)
CREATININE: 0.74 mg/dL (ref 0.61–1.24)
GFR calc Af Amer: >60 mL/min (ref >60)
GFR calc non Af Amer: >60 mL/min (ref >60)
GLUCOSE: 108 mg/dL — AB (ref 70–99)
Potassium: 3.6 mmol/L (ref 3.5–5.1)
Sodium: 139 mmol/L (ref 135–145)

## 2017-09-26 LAB — CBC
HCT: 38.3 % — ABNORMAL LOW (ref 39.0–52.0)
Hemoglobin: 12.6 g/dL — ABNORMAL LOW (ref 13.0–17.0)
MCH: 27.9 pg (ref 26.0–34.0)
MCHC: 32.9 g/dL (ref 30.0–36.0)
MCV: 84.9 fL (ref 78.0–100.0)
PLATELETS: 151 10*3/uL (ref 150–400)
RBC: 4.51 MIL/uL (ref 4.22–5.81)
RDW: 14.6 % (ref 11.5–15.5)
WBC: 7.6 10*3/uL (ref 4.0–10.5)

## 2017-09-26 LAB — ABO/RH: ABO/RH(D): B POS

## 2017-09-26 LAB — HEPARIN LEVEL (UNFRACTIONATED): Heparin Unfractionated: 0.43 IU/mL (ref 0.30–0.70)

## 2017-09-26 LAB — PROTIME-INR
INR: 1.26
Prothrombin Time: 15.7 seconds — ABNORMAL HIGH (ref 11.4–15.2)

## 2017-09-26 LAB — MAGNESIUM: Magnesium: 1.7 mg/dL (ref 1.7–2.4)

## 2017-09-26 MED ORDER — MORPHINE SULFATE (PF) 2 MG/ML IV SOLN
2.0000 mg | Freq: Once | INTRAVENOUS | Status: AC
  Administered 2017-09-26: 2 mg via INTRAVENOUS
  Filled 2017-09-26: qty 1

## 2017-09-26 MED ORDER — ACETAMINOPHEN 10 MG/ML IV SOLN
1000.0000 mg | Freq: Four times a day (QID) | INTRAVENOUS | Status: AC
  Administered 2017-09-26 – 2017-09-27 (×4): 1000 mg via INTRAVENOUS
  Filled 2017-09-26 (×4): qty 100

## 2017-09-26 MED ORDER — SODIUM CHLORIDE 0.9 % IV BOLUS
500.0000 mL | Freq: Once | INTRAVENOUS | Status: AC
  Administered 2017-09-26: 500 mL via INTRAVENOUS

## 2017-09-26 MED ORDER — MORPHINE SULFATE (PF) 2 MG/ML IV SOLN
2.0000 mg | INTRAVENOUS | Status: DC | PRN
  Administered 2017-09-26 – 2017-09-27 (×3): 2 mg via INTRAVENOUS
  Filled 2017-09-26 (×3): qty 1

## 2017-09-26 MED ORDER — METOPROLOL TARTRATE 5 MG/5ML IV SOLN
5.0000 mg | Freq: Once | INTRAVENOUS | Status: AC
  Administered 2017-09-26: 5 mg via INTRAVENOUS
  Filled 2017-09-26: qty 5

## 2017-09-26 NOTE — Progress Notes (Signed)
ANTICOAGULATION CONSULT NOTE - Follow Up Consult  Pharmacy Consult for Heparin Indication: pulmonary embolus  No Known Allergies  Patient Measurements: Height: 5\' 8"  (172.7 cm) Weight: 233 lb 8 oz (105.9 kg) IBW/kg (Calculated) : 68.4 Heparin Dosing Weight:    Vital Signs: Temp: 97.6 F (36.4 C) (09/09 0419) Temp Source: Oral (09/09 0419) BP: 140/116 (09/09 0820) Pulse Rate: 115 (09/09 0820)  Labs: Recent Labs    09/24/17 0016 09/24/17 1135 09/25/17 0251 09/25/17 1022 09/25/17 1856 09/26/17 0626  HGB 12.8*  --  11.7*  --   --  12.6*  HCT 39.6  --  36.0*  --   --  38.3*  PLT 165  --  128*  --   --  151  LABPROT  --   --   --   --   --  15.7*  INR  --   --   --   --   --  1.26  HEPARINUNFRC 0.36  --  0.28* 0.35 0.42 0.43  CREATININE 0.80 0.81 0.64  --   --  0.74  TROPONINI <0.03  --   --   --   --   --     Estimated Creatinine Clearance: 163.6 mL/min (by C-G formula based on SCr of 0.74 mg/dL).   Assessment: Anticoag: Radiology results show acute pulmonary embolism (This is felt related to GI issue/ impediment in venous return ) . Hl: 0.43. Hgb 12.6.  platelets: 151. INR 1.26.  Goal of Therapy:  Heparin level 0.3-0.7 units/ml Monitor platelets by anticoagulation protocol: Yes   Plan:  Continue heparin infusion at 2050 units/hr- off 0400 in AM Surgery 9/10: abdominal colectomy with ileostomy.  Lesette Frary S. Merilynn Finland, PharmD, [facility] Clinical Staff Pharmacist Pager [phone]  Misty Stanley Stillinger 09/26/2017,10:48 AM

## 2017-09-26 NOTE — Progress Notes (Signed)
PROGRESS NOTE  SHAHMEER VENEZIALE HER:[id number] DOB: 1990-09-29 DOA: 09/21/2017 PCP: [name], MD  Brief Narrative: 27 year old man PMH polysubstance abuse in remission, major depression, hypothyroidism, chronic constipation presenting with abdominal pain, massive abdominal distention, nausea, vomiting.  Known to have megacolon with massive stool retention unrelieved by Gastrografin enema.  Admitted to [facility] 9/5 for bilateral pulmonary emboli, acute hypoxic respiratory failure, Ogilvie syndrome with massive retention of stool.  GI recommended neostigmine.  Cardiology felt it best to transfer to [facility] for closer monitoring and cardiology support.  General surgery recommended colectomy but deferred until patient had heparin 5-7 days.  Patient ultimately declined neostigmine and colonic decompression.  Plan is for subtotal colectomy and ileostomy 9/10.  Assessment/Plan Refractory megacolon, Ogilvie syndrome.  Failed maximal medical therapy.  GI recommended neostigmine but because of the patient's PE, recommendation was made to transfer to [facility] for closer monitoring.  Given the risk of bradycardia or ventricular arrhythmias, especially in the setting of bilateral pulmonary embolism, the patient ultimately declined neostigmine.  He was offered colonic decompression by GI and also declined this. --Plan is for subtotal colectomy and ileostomy 9/10. --Pain control --RN reports a weekend RN observed the patient eating applesauce.  Is not clear whether this report is true.   Bilateral pulmonary emboli with acute hypoxic respiratory failure.  Thought secondary to impediment and venous return.  No evidence of heart strain by CT or echocardiogram.  Bilateral lower extremity venous Dopplers negative.. EKG 9/6 ST. --Oxygenation stable on room air.  Continue heparin infusion perioperatively as able.    Massive bilateral lower extremity edema.  Venous Dopplers negative.  Likely secondary to  impeded venous return from massive colonic distention. --No skin change.  Expect will improve postoperatively and when diet can be initiated.  Emphysema, subclinical.  CT suggested opacity airspace disease which in this clinical context may be related to infarcts.  No evidence clinically of pneumonia. --Appears asymptomatic.  Hypothyroidism, started on Synthroid 50 mcg on discharge 9/2. --Continue levothyroxine.  PMH ADD, anxiety, major depressive disorder, polysubstance abuse,  --Citalopram, Depakote, quetiapine, trazodone, buspirone remain on hold, these can be started postoperatively when able.  Chronic back pain.  Gabapentin and cyclobenzaprine on hold.  DVT prophylaxis:  Heparin infusion Code Status: Full Family Communication: Mother at bedside Disposition Plan: home    Brendia Sacks, MD  [facility] Direct contact: [phone] --Via amion app OR  --[email or web address]; password [email or web address]  7PM-7AM contact night coverage as above 09/26/2017, 10:27 AM  LOS: 4 days   Consultants:  GI  General surgery  Cardiology   Procedures:  Echo Study Conclusions  - Left ventricle: The cavity size was normal. Wall thickness was   normal. Systolic function was normal. The estimated ejection   fraction was in the range of 55% to 60%. - Pericardium, extracardiac: A trivial pericardial effusion was   identified.  Antimicrobials:    Interval history/Subjective: Severe pain, no sleep.   Objective: Vitals:  Vitals:   09/26/17 0419 09/26/17 0820  BP:  (!) 140/116  Pulse:  (!) 115  Resp:  (!) 24  Temp: 97.6 F (36.4 C)   SpO2:  93%    Exam: Constitutional:   . Appears calm, ill, uncomfortable Respiratory:  . CTA bilaterally, no w/r/r.  . Respiratory effort normal.  Cardiovascular:  . RRR, no m/r/g . No change 3+ BLE extremity edema   Abdomen:  Marland Kitchen Massively distended Psychiatric:  . Mental status o Mood, affect appropriate  I have personally  reviewed the  following:   Labs:  K+, Mg, phos WNL. BMP unremarkable  CBC stable  Scheduled Meds: . levothyroxine  37.5 mcg Intravenous Daily  . mouth rinse  15 mL Mouth Rinse BID  . milk and molasses  1 enema Rectal Once  . nicotine  21 mg Transdermal Daily  . sodium chloride flush  3 mL Intravenous Q12H   Continuous Infusions: . sodium chloride 100 mL/hr at 09/26/17 0329  . heparin 2,050 Units/hr (09/26/17 0512)    Principal Problem:   Ogilvie's syndrome Active Problems:   MDD (major depressive disorder), recurrent severe, without psychosis (HCC)   Leg edema   Fecal impaction (HCC)   Acute respiratory failure with hypoxia (HCC)   Pulmonary embolism, bilateral (HCC)   Hypothyroidism   Colon distention   LOS: 4 days

## 2017-09-26 NOTE — Progress Notes (Addendum)
[facility]/Trauma Progress Note      Assessment/Plan Principal Problem:   Ogilvie's syndrome Active Problems:   MDD (major depressive disorder), recurrent severe, without psychosis (HCC)   Leg edema   Fecal impaction (HCC)   Acute respiratory failure with hypoxia (HCC)   Pulmonary embolism, bilateral (HCC)   Hypothyroidism   Colon distention  Megacolon with rectal fecal impaction -Colonic inertia versusshort segmentHirschsprung's diseasevspatient has a long-standing history of chronic narcotic abuse and chronic substance abuse.  - recommend total abdominal colectomy with ileostomy.  Acute b/l PE - AppreciateGI considering short term endoscopic decompression.  - WBC WNL - more distention today - Ideally, surgery after heparin for 5 to 7 days would be beneficial. - Pt and mother is refusing neostigmine and decompression. Decompression may be necessary until pt is ready for surgery. I discussed this with mom. Will discuss with MD.   FEN: NPO VTE: SCD's, heparin ID: none Foley: none Follow up:  TBD  Plan: total abdominal colectomy this week with ileostomy. Timing and possible GI decompression prior to be determined by MD. Pt will need cardiac clearance prior to OR. I paged cards master. PT/INR pending   LOS: 4 days    Subjective: CC: abdominal pain  Some nausea without vomiting. Pain is worse. He does not feel his distention is worse. Mom at bedside and she is very concerned about perforation. She does not want to do neostigmine. She is unsure about decompression. I discussed that decompression might be needed before pt is ready to go to the OR 2/2 PE.   Objective: Vital signs in last 24 hours: Temp:  [97.6 F (36.4 C)-98.6 F (37 C)] 97.6 F (36.4 C) (09/09 0419) Pulse Rate:  [111-130] 129 (09/09 0016) Resp:  [14-22] 21 (09/09 0016) BP: (109-137)/(72-96) 137/96 (09/09 0016) SpO2:  [92 %-98 %] 92 % (09/09 0016) Weight:  [105.9 kg] 105.9 kg  (09/09 0612) Last BM Date: 08/18/17  Intake/Output from previous day: 09/08 0701 - 09/09 0700 In: 1414.9 [I.V.:1319.8; IV Piggyback:95.1] Out: -  Intake/Output this shift: No intake/output data recorded.  PE: General appearance:somnolent and chronically ill/ cachectic Resp:rate increased, effort normal VX:YIAX distended, firm, absent BS, generalized TTP without peritonitis  Skin:No rashes noted, warm and dry Neurologic:Grossly normal  Anti-infectives: Anti-infectives (From admission, onward)   None      Lab Results:  Recent Labs    09/25/17 0251 09/26/17 0626  WBC 5.9 7.6  HGB 11.7* 12.6*  HCT 36.0* 38.3*  PLT 128* 151   BMET Recent Labs    09/24/17 1135 09/25/17 0251  NA 138 139  K 3.5 3.4*  CL 104 106  CO2 26 24  GLUCOSE 102* 110*  BUN 11 11  CREATININE 0.81 0.64  CALCIUM 9.2 9.1   PT/INR No results for input(s): LABPROT, INR in the last 72 hours. CMP     Component Value Date/Time   NA 139 09/25/2017 0251   K 3.4 (L) 09/25/2017 0251   CL 106 09/25/2017 0251   CO2 24 09/25/2017 0251   GLUCOSE 110 (H) 09/25/2017 0251   BUN 11 09/25/2017 0251   CREATININE 0.64 09/25/2017 0251   CALCIUM 9.1 09/25/2017 0251   PROT 6.5 09/24/2017 0016   ALBUMIN 3.6 09/24/2017 0016   AST 15 09/24/2017 0016   ALT 13 09/24/2017 0016   ALKPHOS 74 09/24/2017 0016   BILITOT 0.6 09/24/2017 0016   GFRNONAA >60 09/25/2017 0251   GFRAA >60 09/25/2017 0251   Lipase  Component Value Date/Time   LIPASE 37 09/21/2017 2237    Studies/Results: No results found.    Jerre Simon , [facility] 09/26/2017, 8:21 AM  Pager: [phone] Mon-Wed, Friday 7:00am-4:30pm Thurs 7am-11:30am  Consults: [phone]

## 2017-09-26 NOTE — Progress Notes (Signed)
Telemetry monitors notified RN of ST 130s. On assessment, pt was not in distress or experiencing chest pain. RN consulted with MD [name] who is unconcerned as long as the rhythm remains ST. BP was stable at 135/93.

## 2017-09-26 NOTE — Plan of Care (Signed)

## 2017-09-26 NOTE — Progress Notes (Signed)
Patient heart rate in the 130-140's sustained.  Notified Bodenheimer, NP.  Received an order for Metoprolol 5mg  injection and 500 mL bolus.  Will carry out order and continue to monitor the patient.

## 2017-09-26 NOTE — Progress Notes (Signed)
The patient was seen today. His abdomen is still markedly distended. He and his mother have refused both neostigmine as well as colonoscopic decompression to try to help out. Surgery has seen him and it appears they are heading towards total abdominal colectomy with ileostomy. The patient and his mother are agreeable to that.

## 2017-09-26 NOTE — Progress Notes (Signed)
Patient very uncomfortable. Patient stated his pain is still 9/10 after the Toradol was administered.  Patient is getting in and out of bed due to being uncomfortable.  Patient and family is urging for something stronger for pain.  Will notified the MD. Will continue to monitor the patient

## 2017-09-26 NOTE — Progress Notes (Signed)
Progress Note  Patient Name: Philip Thompson Date of Encounter: 09/26/2017  Primary Cardiologist: Dr. Satira Sark. Sherron Monday [facility]) last seen in 2016  Subjective   Feels miserable with significant amount of abdominal pain. Denies CP. No dyspnea. Family present at beside.    Inpatient Medications    Scheduled Meds: . levothyroxine  37.5 mcg Intravenous Daily  . mouth rinse  15 mL Mouth Rinse BID  . milk and molasses  1 enema Rectal Once  . nicotine  21 mg Transdermal Daily  . sodium chloride flush  3 mL Intravenous Q12H   Continuous Infusions: . sodium chloride 100 mL/hr at 09/26/17 0329  . heparin 2,050 Units/hr (09/26/17 0512)   PRN Meds: ketorolac, LORazepam, nitroGLYCERIN, ondansetron **OR** ondansetron (ZOFRAN) IV   Vital Signs    Vitals:   09/26/17 0016 09/26/17 0419 09/26/17 0612 09/26/17 0820  BP: (!) 137/96   (!) 140/116  Pulse: (!) 129   (!) 115  Resp: (!) 21   (!) 24  Temp: 97.9 F (36.6 C) 97.6 F (36.4 C)    TempSrc: Oral Oral    SpO2: 92%   93%  Weight:   105.9 kg   Height:        Intake/Output Summary (Last 24 hours) at 09/26/2017 0905 Last data filed at 09/25/2017 1830 Gross per 24 hour  Intake 1414.85 ml  Output -  Net 1414.85 ml   Filed Weights   09/24/17 0408 09/25/17 0700 09/26/17 0612  Weight: 94.7 kg 99.3 kg 105.9 kg    Telemetry    Sinus tach in the 120s-130s. No other arrhythmias  - Personally Reviewed  ECG    Sinus tach 127 bpm - Personally Reviewed  Physical Exam   GEN: young WM with severely distended abdomen, appears uncomfortable but no respiratory distress.   Neck: No JVD Cardiac: regular rhythm, tachy rate, no murmurs, rubs, or gallops.  Respiratory: Clear to auscultation bilaterally. GI: severely distended, (no palpation performed due to severity of pain) MS: No edema; No deformity. Neuro:  Nonfocal  Psych: Normal affect   Labs    Chemistry Recent Labs  Lab 09/21/17 2237  09/24/17 0016 09/24/17 1135 09/25/17 0251    NA 139   < > 140 138 139  K 3.7   < > 3.2* 3.5 3.4*  CL 103   < > 102 104 106  CO2 27   < > 28 26 24   GLUCOSE 105*   < > 108* 102* 110*  BUN 10   < > 11 11 11   CREATININE 0.66   < > 0.80 0.81 0.64  CALCIUM 9.9   < > 9.5 9.2 9.1  PROT 8.2*  --  6.5  --   --   ALBUMIN 4.3  --  3.6  --   --   AST 18  --  15  --   --   ALT 13  --  13  --   --   ALKPHOS 92  --  74  --   --   BILITOT 0.8  --  0.6  --   --   GFRNONAA >60   < > >60 >60 >60  GFRAA >60   < > >60 >60 >60  ANIONGAP 9   < > 10 8 9    < > = values in this interval not displayed.     Hematology Recent Labs  Lab 09/24/17 0016 09/25/17 0251 09/26/17 0626  WBC 7.3 5.9 7.6  RBC 4.67 4.25 4.51  HGB 12.8* 11.7* 12.6*  HCT 39.6 36.0* 38.3*  MCV 84.8 84.7 84.9  MCH 27.4 27.5 27.9  MCHC 32.3 32.5 32.9  RDW 14.2 14.2 14.6  PLT 165 128* 151    Cardiac Enzymes Recent Labs  Lab 09/22/17 0212 09/24/17 0016  TROPONINI <0.03 <0.03   No results for input(s): TROPIPOC in the last 168 hours.   BNPNo results for input(s): BNP, PROBNP in the last 168 hours.   DDimer No results for input(s): DDIMER in the last 168 hours.   Radiology    No results found.  Cardiac Studies   2D Echo 09/22/17 Study Conclusions  - Left ventricle: The cavity size was normal. Wall thickness was   normal. Systolic function was normal. The estimated ejection   fraction was in the range of 55% to 60%. - Pericardium, extracardiac: A trivial pericardial effusion was   identified.  Patient Profile     27 y.o. male with a hx of ADD, anxiety, depression, polysubstance abuse, transient cardiomyopathy 2015 in setting of acute drug intoxication/VDRFwhom cardiology was initially consulted to see for the evaluation of possible neostigmine administrationat the request of Dr. Lovell Sheehan. Cardiology now asked to re-evaluate for preoperative cardiovascular risk assessment/clearance prior to undergoing colon surgery. We are also asked to weigh in on  anticoagulation.   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 He is needing to undergo likely total abdominal colectomy w/ ileostomy.   Assessment & Plan    1. Megacolon: Ogilvie's syndrome. No neostigmine given (patient refused). Surgery has evaluated pt. Recommendations are for total abdominal colectomy with ileostomy.   2. Bilateral PE: This is felt related to GI issue/ impediment in venous return (evidence of venous compression on CT scan). 2D echo with normal RV function. Currently on IV heparin. Once post op, he will need transition to an oral anticoagulant. Long term, given his compliance concerns and GI concerns, coumadin may be a safer option as it can be monitored.   3. Pre-op: Pt has prior history of cardiomyopathy, in the setting of acute drug intoxication/VDRF in 2015. EF then was 25%, but recovered back to normal. No other cardiac history. Echo this admit shows normal LVEF. No significant valvular abnormalities. Only mild TR and MR. No RV strain from PE. EKG on 9/6 showed sinus tach at a rate of 127 bpm w/ Nonspecific T wave abnormality, in the setting of bilateral PE and abdominal pain. Tele also sinus tach in the 120-130s, but no other arrhythmias. He denies any anginal symptoms and prior to this admit, pt could perform >4 METS of physical activity, w/o exertional CP or dyspnea. No indication for further cardiac w/u prior to surgery. Can proceed. Cardiology will follow along. MD to follow with further recs regarding perioperative anticoagulation in the setting of PE.   For questions or updates, please contact [facility] Please consult [email or web address] for contact info under        Signed, Robbie Lis, PA-C  09/26/2017, 9:05 AM

## 2017-09-27 ENCOUNTER — Inpatient Hospital Stay (HOSPITAL_COMMUNITY): Payer: BLUE CROSS/BLUE SHIELD | Admitting: Anesthesiology

## 2017-09-27 ENCOUNTER — Encounter (HOSPITAL_COMMUNITY)
Admission: EM | Disposition: A | Discharge status: Rehabilitation Facility | Payer: Self-pay | Source: Home / Self Care | Attending: Pulmonary Disease

## 2017-09-27 ENCOUNTER — Inpatient Hospital Stay (HOSPITAL_COMMUNITY): Payer: BLUE CROSS/BLUE SHIELD

## 2017-09-27 ENCOUNTER — Encounter (HOSPITAL_COMMUNITY): Payer: Self-pay | Admitting: Orthopedic Surgery

## 2017-09-27 DIAGNOSIS — R600 Localized edema: Secondary | ICD-10-CM

## 2017-09-27 DIAGNOSIS — R112 Nausea with vomiting, unspecified: Secondary | ICD-10-CM

## 2017-09-27 DIAGNOSIS — J9601 Acute respiratory failure with hypoxia: Secondary | ICD-10-CM

## 2017-09-27 HISTORY — PX: COLECTOMY: SHX59

## 2017-09-27 LAB — BASIC METABOLIC PANEL
ANION GAP: 15 (ref 5–15)
Anion gap: 12 (ref 5–15)
BUN: 17 mg/dL (ref 6–20)
BUN: 20 mg/dL (ref 6–20)
CHLORIDE: 103 mmol/L (ref 98–111)
CHLORIDE: 104 mmol/L (ref 98–111)
CO2: 22 mmol/L (ref 22–32)
CO2: 19 mmol/L — AB (ref 22–32)
CREATININE: 1.12 mg/dL (ref 0.61–1.24)
Calcium: 9.2 mg/dL (ref 8.9–10.3)
Calcium: 8.4 mg/dL — ABNORMAL LOW (ref 8.9–10.3)
Creatinine, Ser: 0.86 mg/dL (ref 0.61–1.24)
GFR calc Af Amer: >60 mL/min (ref >60)
GFR calc non Af Amer: >60 mL/min (ref >60)
GFR calc non Af Amer: >60 mL/min (ref >60)
Glucose, Bld: 125 mg/dL — ABNORMAL HIGH (ref 70–99)
Glucose, Bld: 113 mg/dL — ABNORMAL HIGH (ref 70–99)
POTASSIUM: 4.2 mmol/L (ref 3.5–5.1)
Potassium: 4.1 mmol/L (ref 3.5–5.1)
SODIUM: 138 mmol/L (ref 135–145)
Sodium: 137 mmol/L (ref 135–145)

## 2017-09-27 LAB — POCT I-STAT 3, ART BLOOD GAS (G3+)
Acid-base deficit: 1 mmol/L (ref 0.0–2.0)
Bicarbonate: 24.7 mmol/L (ref 20.0–28.0)
O2 SAT: 99 %
PCO2 ART: 43.9 mmHg (ref 32.0–48.0)
PO2 ART: 150 mmHg — AB (ref 83.0–108.0)
Patient temperature: 98.6
TCO2: 26 mmol/L (ref 22–32)
pH, Arterial: 7.358 (ref 7.350–7.450)

## 2017-09-27 LAB — CBC
HCT: 41.4 % (ref 39.0–52.0)
HCT: 39.4 % (ref 39.0–52.0)
HEMATOCRIT: 39.5 % (ref 39.0–52.0)
HEMOGLOBIN: 13.1 g/dL (ref 13.0–17.0)
HEMOGLOBIN: 12.8 g/dL — AB (ref 13.0–17.0)
Hemoglobin: 13.5 g/dL (ref 13.0–17.0)
MCH: 27.6 pg (ref 26.0–34.0)
MCH: 28.2 pg (ref 26.0–34.0)
MCH: 28.1 pg (ref 26.0–34.0)
MCHC: 32.6 g/dL (ref 30.0–36.0)
MCHC: 33.2 g/dL (ref 30.0–36.0)
MCHC: 32.5 g/dL (ref 30.0–36.0)
MCV: 84.5 fL (ref 78.0–100.0)
MCV: 84.9 fL (ref 78.0–100.0)
MCV: 86.4 fL (ref 78.0–100.0)
PLATELETS: 141 10*3/uL — AB (ref 150–400)
Platelets: 171 10*3/uL (ref 150–400)
Platelets: 129 10*3/uL — ABNORMAL LOW (ref 150–400)
RBC: 4.9 MIL/uL (ref 4.22–5.81)
RBC: 4.65 MIL/uL (ref 4.22–5.81)
RBC: 4.56 MIL/uL (ref 4.22–5.81)
RDW: 14.6 % (ref 11.5–15.5)
RDW: 14.6 % (ref 11.5–15.5)
RDW: 14.7 % (ref 11.5–15.5)
WBC: 4.2 10*3/uL (ref 4.0–10.5)
WBC: 5.8 10*3/uL (ref 4.0–10.5)
WBC: 8.6 10*3/uL (ref 4.0–10.5)

## 2017-09-27 LAB — POCT I-STAT 4, (NA,K, GLUC, HGB,HCT)
Glucose, Bld: 107 mg/dL — ABNORMAL HIGH (ref 70–99)
HEMATOCRIT: 28 % — AB (ref 39.0–52.0)
Hemoglobin: 9.5 g/dL — ABNORMAL LOW (ref 13.0–17.0)
Potassium: 4.5 mmol/L (ref 3.5–5.1)
SODIUM: 138 mmol/L (ref 135–145)

## 2017-09-27 LAB — BLOOD GAS, ARTERIAL
ACID-BASE DEFICIT: 2.2 mmol/L — AB (ref 0.0–2.0)
BICARBONATE: 21.5 mmol/L (ref 20.0–28.0)
DRAWN BY: 419771
FIO2: 100
O2 SAT: 95.5 %
PATIENT TEMPERATURE: 98.6
pCO2 arterial: 33.4 mmHg (ref 32.0–48.0)
pH, Arterial: 7.424 (ref 7.350–7.450)
pO2, Arterial: 83.2 mmHg (ref 83.0–108.0)

## 2017-09-27 LAB — PHOSPHORUS: PHOSPHORUS: 4.7 mg/dL — AB (ref 2.5–4.6)

## 2017-09-27 LAB — SURGICAL PCR SCREEN
MRSA, PCR: NEGATIVE
Staphylococcus aureus: NEGATIVE

## 2017-09-27 LAB — HEPARIN LEVEL (UNFRACTIONATED): Heparin Unfractionated: 0.13 IU/mL — ABNORMAL LOW (ref 0.30–0.70)

## 2017-09-27 LAB — MAGNESIUM: Magnesium: 1.6 mg/dL — ABNORMAL LOW (ref 1.7–2.4)

## 2017-09-27 LAB — PREPARE RBC (CROSSMATCH)

## 2017-09-27 LAB — TRIGLYCERIDES: Triglycerides: 99 mg/dL (ref <150)

## 2017-09-27 SURGERY — COLECTOMY, TOTAL
Anesthesia: General | Site: Abdomen

## 2017-09-27 MED ORDER — SODIUM CHLORIDE 0.9 % IV SOLN
INTRAVENOUS | Status: DC | PRN
  Administered 2017-09-27: 50 ug/min via INTRAVENOUS

## 2017-09-27 MED ORDER — ROCURONIUM BROMIDE 50 MG/5ML IV SOSY
PREFILLED_SYRINGE | INTRAVENOUS | Status: AC
  Filled 2017-09-27: qty 5

## 2017-09-27 MED ORDER — LACTATED RINGERS IV SOLN
INTRAVENOUS | Status: DC | PRN
  Administered 2017-09-27: 11:00:00 via INTRAVENOUS

## 2017-09-27 MED ORDER — LACTATED RINGERS IV SOLN
INTRAVENOUS | Status: AC
  Administered 2017-09-27 – 2017-09-29 (×5): via INTRAVENOUS

## 2017-09-27 MED ORDER — DEXAMETHASONE SODIUM PHOSPHATE 10 MG/ML IJ SOLN
INTRAMUSCULAR | Status: AC
  Filled 2017-09-27: qty 1

## 2017-09-27 MED ORDER — DEXAMETHASONE SODIUM PHOSPHATE 10 MG/ML IJ SOLN
INTRAMUSCULAR | Status: DC | PRN
  Administered 2017-09-27: 10 mg via INTRAVENOUS

## 2017-09-27 MED ORDER — PROPOFOL 500 MG/50ML IV EMUL
INTRAVENOUS | Status: DC | PRN
  Administered 2017-09-27: 150 ug/kg/min via INTRAVENOUS
  Administered 2017-09-27: 100 ug/kg/min via INTRAVENOUS

## 2017-09-27 MED ORDER — FENTANYL CITRATE (PF) 250 MCG/5ML IJ SOLN
INTRAMUSCULAR | Status: DC | PRN
  Administered 2017-09-27 (×6): 50 ug via INTRAVENOUS
  Administered 2017-09-27: 100 ug via INTRAVENOUS
  Administered 2017-09-27 (×2): 50 ug via INTRAVENOUS

## 2017-09-27 MED ORDER — MAGNESIUM SULFATE 2 GM/50ML IV SOLN
2.0000 g | Freq: Once | INTRAVENOUS | Status: AC
  Administered 2017-09-27: 2 g via INTRAVENOUS
  Filled 2017-09-27: qty 50

## 2017-09-27 MED ORDER — KETAMINE HCL 10 MG/ML IJ SOLN
INTRAMUSCULAR | Status: DC | PRN
  Administered 2017-09-27 (×5): 10 mg via INTRAVENOUS

## 2017-09-27 MED ORDER — MIDAZOLAM HCL 5 MG/5ML IJ SOLN
INTRAMUSCULAR | Status: DC | PRN
  Administered 2017-09-27: 4 mg via INTRAVENOUS
  Administered 2017-09-27: 2 mg via INTRAVENOUS

## 2017-09-27 MED ORDER — FENTANYL 2500MCG IN NS 250ML (10MCG/ML) PREMIX INFUSION
0.0000 ug/h | INTRAVENOUS | Status: DC
  Administered 2017-09-27: 25 ug/h via INTRAVENOUS
  Administered 2017-09-28: 150 ug/h via INTRAVENOUS
  Administered 2017-09-28 – 2017-09-29 (×2): 300 ug/h via INTRAVENOUS
  Filled 2017-09-27 (×4): qty 250

## 2017-09-27 MED ORDER — LIDOCAINE 2% (20 MG/ML) 5 ML SYRINGE
INTRAMUSCULAR | Status: DC | PRN
  Administered 2017-09-27: 60 mg via INTRAVENOUS

## 2017-09-27 MED ORDER — FAMOTIDINE IN NACL 20-0.9 MG/50ML-% IV SOLN
20.0000 mg | Freq: Two times a day (BID) | INTRAVENOUS | Status: DC
  Administered 2017-09-27 – 2017-09-29 (×6): 20 mg via INTRAVENOUS
  Filled 2017-09-27 (×7): qty 50

## 2017-09-27 MED ORDER — KETAMINE HCL 50 MG/5ML IJ SOSY
PREFILLED_SYRINGE | INTRAMUSCULAR | Status: AC
  Filled 2017-09-27: qty 5

## 2017-09-27 MED ORDER — HEPARIN (PORCINE) IN NACL 100-0.45 UNIT/ML-% IJ SOLN
2050.0000 [IU]/h | INTRAMUSCULAR | Status: DC
  Administered 2017-09-27 – 2017-09-28 (×3): 2050 [IU]/h via INTRAVENOUS
  Filled 2017-09-27 (×3): qty 250

## 2017-09-27 MED ORDER — ORAL CARE MOUTH RINSE
15.0000 mL | OROMUCOSAL | Status: DC
  Administered 2017-09-27 – 2017-09-29 (×22): 15 mL via OROMUCOSAL

## 2017-09-27 MED ORDER — ROCURONIUM BROMIDE 10 MG/ML (PF) SYRINGE
PREFILLED_SYRINGE | INTRAVENOUS | Status: DC | PRN
  Administered 2017-09-27: 20 mg via INTRAVENOUS
  Administered 2017-09-27: 50 mg via INTRAVENOUS
  Administered 2017-09-27: 30 mg via INTRAVENOUS
  Administered 2017-09-27: 50 mg via INTRAVENOUS

## 2017-09-27 MED ORDER — MIDAZOLAM HCL 2 MG/2ML IJ SOLN
INTRAMUSCULAR | Status: AC
  Filled 2017-09-27: qty 2

## 2017-09-27 MED ORDER — HEPARIN (PORCINE) IN NACL 100-0.45 UNIT/ML-% IJ SOLN: 2050.0000 [IU]/h | INTRAMUSCULAR | Status: DC

## 2017-09-27 MED ORDER — PROPOFOL 1000 MG/100ML IV EMUL
INTRAVENOUS | Status: AC
  Filled 2017-09-27: qty 100

## 2017-09-27 MED ORDER — PIPERACILLIN-TAZOBACTAM 3.375 G IVPB
3.3750 g | Freq: Three times a day (TID) | INTRAVENOUS | Status: DC
  Administered 2017-09-27 – 2017-10-06 (×28): 3.375 g via INTRAVENOUS
  Filled 2017-09-27 (×29): qty 50

## 2017-09-27 MED ORDER — FENTANYL CITRATE (PF) 100 MCG/2ML IJ SOLN: 100.0000 ug | INTRAMUSCULAR | Status: DC | PRN

## 2017-09-27 MED ORDER — FENTANYL CITRATE (PF) 250 MCG/5ML IJ SOLN
INTRAMUSCULAR | Status: AC
  Filled 2017-09-27: qty 5

## 2017-09-27 MED ORDER — NOREPINEPHRINE 4 MG/250ML-% IV SOLN: 0.0000 ug/min | INTRAVENOUS | Status: DC

## 2017-09-27 MED ORDER — PHENYLEPHRINE 40 MCG/ML (10ML) SYRINGE FOR IV PUSH (FOR BLOOD PRESSURE SUPPORT)
PREFILLED_SYRINGE | INTRAVENOUS | Status: DC | PRN
  Administered 2017-09-27: 80 ug via INTRAVENOUS

## 2017-09-27 MED ORDER — CHLORHEXIDINE GLUCONATE 0.12% ORAL RINSE (MEDLINE KIT)
15.0000 mL | Freq: Two times a day (BID) | OROMUCOSAL | Status: DC
  Administered 2017-09-27 – 2017-10-14 (×35): 15 mL via OROMUCOSAL

## 2017-09-27 MED ORDER — LIDOCAINE 2% (20 MG/ML) 5 ML SYRINGE
INTRAMUSCULAR | Status: AC
  Filled 2017-09-27: qty 5

## 2017-09-27 MED ORDER — ALBUMIN HUMAN 5 % IV SOLN
INTRAVENOUS | Status: DC | PRN
  Administered 2017-09-27 (×2): via INTRAVENOUS

## 2017-09-27 MED ORDER — SUCCINYLCHOLINE CHLORIDE 200 MG/10ML IV SOSY
PREFILLED_SYRINGE | INTRAVENOUS | Status: DC | PRN
  Administered 2017-09-27: 100 mg via INTRAVENOUS

## 2017-09-27 MED ORDER — ACETAMINOPHEN 10 MG/ML IV SOLN
1000.0000 mg | Freq: Four times a day (QID) | INTRAVENOUS | Status: AC | PRN
  Administered 2017-09-27 – 2017-09-28 (×2): 1000 mg via INTRAVENOUS
  Filled 2017-09-27 (×3): qty 100

## 2017-09-27 MED ORDER — PROPOFOL 10 MG/ML IV BOLUS
INTRAVENOUS | Status: DC | PRN
  Administered 2017-09-27: 200 mg via INTRAVENOUS

## 2017-09-27 MED ORDER — ONDANSETRON HCL 4 MG/2ML IJ SOLN
INTRAMUSCULAR | Status: DC | PRN
  Administered 2017-09-27: 4 mg via INTRAVENOUS

## 2017-09-27 MED ORDER — ACETAMINOPHEN 10 MG/ML IV SOLN
1000.0000 mg | Freq: Four times a day (QID) | INTRAVENOUS | Status: DC
  Filled 2017-09-27: qty 100

## 2017-09-27 MED ORDER — SUCCINYLCHOLINE CHLORIDE 200 MG/10ML IV SOSY
PREFILLED_SYRINGE | INTRAVENOUS | Status: AC
  Filled 2017-09-27: qty 10

## 2017-09-27 MED ORDER — SODIUM CHLORIDE 0.9 % IV SOLN: 10.0000 mL/h | Freq: Once | INTRAVENOUS | Status: DC

## 2017-09-27 MED ORDER — ONDANSETRON HCL 4 MG/2ML IJ SOLN
INTRAMUSCULAR | Status: AC
  Filled 2017-09-27: qty 2

## 2017-09-27 MED ORDER — PROPOFOL 10 MG/ML IV BOLUS
INTRAVENOUS | Status: AC
  Filled 2017-09-27: qty 20

## 2017-09-27 MED ORDER — PHENYLEPHRINE 40 MCG/ML (10ML) SYRINGE FOR IV PUSH (FOR BLOOD PRESSURE SUPPORT)
PREFILLED_SYRINGE | INTRAVENOUS | Status: AC
  Filled 2017-09-27: qty 10

## 2017-09-27 MED ORDER — 0.9 % SODIUM CHLORIDE (POUR BTL) OPTIME
TOPICAL | Status: DC | PRN
  Administered 2017-09-27: 4000 mL

## 2017-09-27 MED ORDER — PROPOFOL 1000 MG/100ML IV EMUL
0.0000 ug/kg/min | INTRAVENOUS | Status: DC
  Administered 2017-09-27: 50 ug/kg/min via INTRAVENOUS
  Administered 2017-09-27 – 2017-09-28 (×2): 40 ug/kg/min via INTRAVENOUS
  Administered 2017-09-28: 30 ug/kg/min via INTRAVENOUS
  Administered 2017-09-28 (×2): 40 ug/kg/min via INTRAVENOUS
  Administered 2017-09-29: 50 ug/kg/min via INTRAVENOUS
  Administered 2017-09-29: 45 ug/kg/min via INTRAVENOUS
  Filled 2017-09-27 (×9): qty 100

## 2017-09-27 SURGICAL SUPPLY — 69 items
BLADE CLIPPER SURG (BLADE) IMPLANT
CANISTER SUCT 3000ML PPV (MISCELLANEOUS) ×2 IMPLANT
CHLORAPREP W/TINT 26ML (MISCELLANEOUS) ×2 IMPLANT
COVER BACK TABLE 60X90IN (DRAPES) ×2 IMPLANT
COVER MAYO STAND STRL (DRAPES) ×4 IMPLANT
COVER SURGICAL LIGHT HANDLE (MISCELLANEOUS) ×2 IMPLANT
COVER TABLE BACK 60X90 (DRAPES) ×2 IMPLANT
DRAPE HALF SHEET 40X57 (DRAPES) IMPLANT
DRAPE INCISE IOBAN 66X45 STRL (DRAPES) IMPLANT
DRAPE LAPAROSCOPIC ABDOMINAL (DRAPES) ×2 IMPLANT
DRAPE UTILITY XL STRL (DRAPES) ×2 IMPLANT
DRAPE WARM FLUID 44X44 (DRAPE) ×2 IMPLANT
DRSG OPSITE POSTOP 4X10 (GAUZE/BANDAGES/DRESSINGS) ×2 IMPLANT
DRSG OPSITE POSTOP 4X8 (GAUZE/BANDAGES/DRESSINGS) IMPLANT
ELECT BLADE 6.5 EXT (BLADE) IMPLANT
ELECT CAUTERY BLADE 6.4 (BLADE) ×4 IMPLANT
ELECT REM PT RETURN 9FT ADLT (ELECTROSURGICAL) ×2
ELECTRODE REM PT RTRN 9FT ADLT (ELECTROSURGICAL) ×1 IMPLANT
GLOVE BIO SURGEON STRL SZ7 (GLOVE) ×4 IMPLANT
GLOVE BIOGEL M 8.0 STRL (GLOVE) ×8 IMPLANT
GLOVE BIOGEL M STRL SZ7.5 (GLOVE) ×8 IMPLANT
GLOVE BIOGEL PI IND STRL 7.5 (GLOVE) ×4 IMPLANT
GLOVE BIOGEL PI IND STRL 8 (GLOVE) ×2 IMPLANT
GLOVE BIOGEL PI INDICATOR 7.5 (GLOVE) ×4
GLOVE BIOGEL PI INDICATOR 8 (GLOVE) ×2
GLOVE SURG SIGNA 7.5 PF LTX (GLOVE) ×8 IMPLANT
GOWN STRL REUS W/ TWL LRG LVL3 (GOWN DISPOSABLE) ×4 IMPLANT
GOWN STRL REUS W/ TWL XL LVL3 (GOWN DISPOSABLE) ×4 IMPLANT
GOWN STRL REUS W/TWL LRG LVL3 (GOWN DISPOSABLE) ×4
GOWN STRL REUS W/TWL XL LVL3 (GOWN DISPOSABLE) ×4
KIT BASIN OR (CUSTOM PROCEDURE TRAY) ×2 IMPLANT
KIT OSTOMY DRAINABLE 2.75 STR (WOUND CARE) ×2 IMPLANT
KIT TURNOVER KIT B (KITS) ×2 IMPLANT
LEGGING LITHOTOMY PAIR STRL (DRAPES) IMPLANT
LIGASURE IMPACT 36 18CM CVD LR (INSTRUMENTS) ×2 IMPLANT
NS IRRIG 1000ML POUR BTL (IV SOLUTION) ×8 IMPLANT
PACK COLON (CUSTOM PROCEDURE TRAY) ×2 IMPLANT
PACK GENERAL/GYN (CUSTOM PROCEDURE TRAY) IMPLANT
PAD ARMBOARD 7.5X6 YLW CONV (MISCELLANEOUS) ×4 IMPLANT
PENCIL BUTTON HOLSTER BLD 10FT (ELECTRODE) ×2 IMPLANT
RELOAD AUTO 90-4.8 TA90 GRN (ENDOMECHANICALS) ×2 IMPLANT
RELOAD PROXIMATE 100 BLUE (MISCELLANEOUS) ×2 IMPLANT
RELOAD PROXIMATE 100MM BLUE (MISCELLANEOUS) ×2
SEALANT PATCH FIBRIN 2X4IN (MISCELLANEOUS) ×2 IMPLANT
SPECIMEN JAR X LARGE (MISCELLANEOUS) ×2 IMPLANT
SPONGE LAP 18X18 X RAY DECT (DISPOSABLE) ×12 IMPLANT
STAPLER PROXIMATE 100MM BLUE (MISCELLANEOUS) ×2 IMPLANT
STAPLER TA90 4.8 THK SLIMI (STAPLE) ×4 IMPLANT
STAPLER VISISTAT 35W (STAPLE) ×2 IMPLANT
SUCTION POOLE TIP (SUCTIONS) ×2 IMPLANT
SURGILUBE 2OZ TUBE FLIPTOP (MISCELLANEOUS) IMPLANT
SUT ETHILON 2 0 FS 18 (SUTURE) ×2 IMPLANT
SUT PDS AB 1 TP1 96 (SUTURE) ×4 IMPLANT
SUT PROLENE 2 0 CT2 30 (SUTURE) IMPLANT
SUT PROLENE 2 0 KS (SUTURE) IMPLANT
SUT SILK 2 0 SH CR/8 (SUTURE) ×14 IMPLANT
SUT SILK 2 0 TIES 10X30 (SUTURE) ×4 IMPLANT
SUT SILK 3 0 SH CR/8 (SUTURE) ×2 IMPLANT
SUT SILK 3 0 TIES 10X30 (SUTURE) ×2 IMPLANT
SUT VIC AB 3-0 SH 18 (SUTURE) ×4 IMPLANT
SYR BULB IRRIGATION 50ML (SYRINGE) ×2 IMPLANT
SYSTEM CHEST DRAIN TLS 7FR (DRAIN) ×2 IMPLANT
TOWEL OR 17X26 10 PK STRL BLUE (TOWEL DISPOSABLE) ×4 IMPLANT
TRAY FOLEY MTR SLVR 14FR STAT (SET/KITS/TRAYS/PACK) ×2 IMPLANT
TRAY PROCTOSCOPIC FIBER OPTIC (SET/KITS/TRAYS/PACK) IMPLANT
TUBE CONNECTING 12X1/4 (SUCTIONS) ×2 IMPLANT
TUBE CONNECTING 20X1/4 (TUBING) ×2 IMPLANT
UNDERPAD 30X30 (UNDERPADS AND DIAPERS) IMPLANT
YANKAUER SUCT BULB TIP NO VENT (SUCTIONS) IMPLANT

## 2017-09-27 NOTE — Significant Event (Addendum)
Rapid Response Event Note  Received a call at 550 that patient had vomited 800cc of foul smelling emesis, RNs were concerned that patient had aspirated. I was on call with a patient with a medical emergency, I asked that the RNs paged [facility] NP and request that provider come see that patient as soon as they could. When I arrived at 0615, MD had seen the patient and ordered that NG tube be placed. Patient's saturations prior to NGT insertion were in the 84-86% on 2L [place], I increased his oxygen, patient vomited again prior to NGT being placed, vomited during NGT insertion, and after. Saturations dropped in the uppers 70s, patient was placed on NRB 15L and NGT was secured. During placement, brown/foul smelling emesis was retreived from NGT. Patient was suction as much as possible during this event, but I do think that patient aspirated prior to NGT being placed. I had another RN paged the [facility] MD back to bedside, I informed MD that patient was hypoxic and requiring NRB and lethargic now, earlier when I saw, he was much more alert. Patient now appeared fatigued, still would talk when promptly but was sleepy. STAT CXR/ABD XR and ABG were ordered and MD came to bedside, patient was placed on NRB 15L at 0639, ABG taken at 0647, resulted 7.42/33.4/83.2/21.5/95.5 on 100% NRB 15L. Bilateral breath sounds confirmed. NTG flushed 20cc x 3 (clogging up), set to LIWS.   Total patient has put out well over 1.5L from vomiting and NG collection.   TR[facility] MD did consult PCCM, Day PCCM MD will come see patient, transfer orders to ICU were written. Day TRH MD came and was updated as well.   During this event, HR was in the 150s, RR in the 50s, and sats were low in the 80s. After HR was 130-140s, RR in the uppers 30s, patient stated he felt a little better but still was uncomfortable, SBP stable, afebrile.   Call Time 550 End Time 735

## 2017-09-27 NOTE — Op Note (Signed)
SUBTOTAL COLECTOMY AND ILEOSTOMY  Procedure Note  Philip Thompson 09/21/2017 - 09/27/2017   Pre-op Diagnosis: colonic inertia     Post-op Diagnosis: same  Procedure(s): SUBTOTAL COLECTOMY AND ILEOSTOMY  Surgeon(s): Manus Rudd, MD Abigail Miyamoto, MD  Anesthesia: General  Staff:  Circulator: Nelma Rothman, RN Physician Assistant: Sherrie George, PA-C Relief Circulator: Maree Erie, RN Scrub Person: Dennie Maizes Circulator Assistant: Maureen Ralphs, RN  Estimated Blood Loss: 300 mL               Specimens: sent to path  Indications: This is Thompson 27 year old gentleman with complete colonic inertia and severe obstructing constipation who presents the need for Thompson subtotal colectomy.  He is also developed DVTs and is on heparin for pulmonary embolism.  He has Thompson history of chronic narcotic abuse.  Findings: The patient was found to have Thompson massively distended colon from the cecum all the way to the distal rectum and pelvic brim.  There was no evidence of perforation.  Procedure: The patient was brought to the operating room and identified as correct patient.  He was placed upon the operating table and general anesthesia was induced.  He was then placed in the lithotomy position.  We then disimpacted Thompson large amount of stool from his rectum and anus.  Next his abdomen was prepped and draped in usual sterile fashion.  Thompson created Thompson large midline incision with Thompson scalpel.  We then dissected down through the fascia and peritoneum with the cautery.  Upon entering the abdomen, the patient was found to have Thompson massively distended colon with Thompson large redundant loop of sigmoid colon.  We mobilized the distal ileum and then transected with Thompson GIA 100 stapler.  We then mobilized the appendix and cecum working up along the white line of Toldt.  Several adhesions were taken down with the LigaSure.  We then dissected around the hepatic flexure moving toward the splenic flexure.  The splenic  flexure was then slowly taken down as well with electrocautery.  We then continued to mobilize the descending colon along the white line of Toldt going down to the sigmoid colon and rectum.  The distal rectum was so large that Thompson GIA 100 stapler would not go across it and we cannot even close it in any measure.  I also tried Thompson TX 90 and was unsuccessful as well.  We milked as much stool distally and again out of the rectum and anus is possible.  I then came across the distal rectum with several Kocher clamps and then transected with Thompson scalpel.  The very large colon specimen was then placed on the back table.  It was then sent to pathology for evaluation.  At this point, I oversewed the rectal stump with interrupted 2-0 silk sutures.  Good closure appeared to be achieved and the tissue appeared viable.  We then thoroughly irrigated the abdomen with several liters normal saline.  Hemostasis was achieved on the rectal stump with several sutures and cautery.  We also achieved hemostasis at the spleen with the cautery and Thompson piece of [place].  At this point Thompson [address] [place] drain was placed into the pelvis to the left lower quadrant direct vision and sutured in place.  We made an opening in the left lower abdomen with Thompson scalpel laterally for the ostomy and created opening circumferentially with cautery.  We then dissected out the muscle layers and opened the peritoneum.  The distal ileum was  then pulled out of this as the end ileostomy.  The patient's midline fascia was then closed with running #1 looped PDS suture.  The skin was irrigated and closed with skin staples.  The patient's ostomy was then matured with interrupted 3-0 Vicryl sutures.  An ostomy appliance was applied.  The patient remained stable throughout the procedure.  He was then left intubated and taken in Thompson stable condition from the operating room to the intensive care unit.          Philip Thompson   Date: 09/27/2017  Time: 1:17 PM

## 2017-09-27 NOTE — Anesthesia Preprocedure Evaluation (Signed)
Anesthesia Evaluation  Patient identified by MRN, date of birth, ID band Patient awake    Reviewed: Allergy & Precautions, NPO status , Patient's Chart, lab work & pertinent test results  History of Anesthesia Complications Negative for: history of anesthetic complications  Airway Mallampati: II  TM Distance: >3 FB Neck ROM: Full    Dental   Pulmonary Current Smoker, PE Currently in acute respiratory failure requiring non-rebreather oxygen; recent b/l PE and suspected aspiration event  Rapid shallow breathing  + rhonchi        Cardiovascular Exercise Tolerance: Poor  Rhythm:Regular Rate:Tachycardia  Hx of cardiomyopathy with EF <25%, now recovered   Neuro/Psych PSYCHIATRIC DISORDERS Anxiety Depression Bipolar Disorder Hx of IV drug abusenegative neurological ROS     GI/Hepatic Neg liver ROS, Refractory severe megacolon   Endo/Other  Hypothyroidism   Renal/GU negative Renal ROS  negative genitourinary   Musculoskeletal negative musculoskeletal ROS (+)   Abdominal Massively distended abdomen; NG tube in place  Peds  Hematology negative hematology ROS (+)   Anesthesia Other Findings   Reproductive/Obstetrics                             Anesthesia Physical Anesthesia Plan  ASA: IV  Anesthesia Plan: General   Post-op Pain Management:    Induction: Intravenous, Rapid sequence and Cricoid pressure planned  PONV Risk Score and Plan: 2 and Ondansetron, Dexamethasone and Treatment may vary due to age or medical condition  Airway Management Planned: Oral ETT and Video Laryngoscope Planned  Additional Equipment:   Intra-op Plan:   Post-operative Plan: Post-operative intubation/ventilation  Informed Consent: I have reviewed the patients History and Physical, chart, labs and discussed the procedure including the risks, benefits and alternatives for the proposed anesthesia with the patient  or authorized representative who has indicated his/her understanding and acceptance.     Plan Discussed with:   Anesthesia Plan Comments: (Discussed need for postop ventilation with patient given his poor pre-op respiratory status. )        Anesthesia Quick Evaluation

## 2017-09-27 NOTE — Progress Notes (Signed)
eLink Physician-Brief Progress Note Patient Name: Philip Thompson DOB: 1990/03/31 MRN: [id number]   Date of Service  09/27/2017  HPI/Events of Note  Fever post op with some tachycardia Otherwise HD stable, on ABX  eICU Interventions  IV APAP prn        Max Fickle 09/27/2017, 10:15 PM

## 2017-09-27 NOTE — Significant Event (Addendum)
Rapid Response Event Note  Overview: Time Called: 2206 Arrival Time: 2215 Event Type: Other - Tachycardia/Pain Management/Anxiety/Tachypnea  Initial Focused Assessment: Called by RNs about patient having increased HR and RR and patient having pain and anxiety as well. +Megacolon, scheduled for OR in the morning. Per RN, patient is uncomfortable and current pain regiment is not working. Upon reviewing patient's chart, elevated HR/RR and pain/anxiety management have been ongoing issues and are not acute in nature (per chart). Upon arrival, patient was laying in back, + very distended abdomen, + abdominal pain and + chronic back pain. I asked the patient if the pain was exacerbating his anxiety or if he felt like his anxiety was exacerbating his pain.  Per patient, he felt like his pain was making his anxiety worse and he was worried about the surgery as well. RR was elevated but has been, no fever, no change in the location/frequency/type of pain. Skin was warm and dry, trace edema in legs, neuro intact. I paged TRH NP, we discussed the patient. Patient received last dose of Ativan 1mg  IV at 2215 at Morphine 2mg  at 2027, also received Toradol 30mg  at 1907.   Interventions: - RN to give PRN Morphine 2mg  IV dose now (it is earlier than ordered, per NP okay to give) and scheduled IV APAP. - Patient was instructed on the importance of staying in bed when receiving IV narcotics and anxiolytics.  - We discussed that patient will not completely pain free but that we can work on symptom management as long as we are keeping safety as a priority.  Plan of Care: - RN to follow up with [facility] NP with further concerns.   Event Summary: Name of Physician Notified: TRH NP at 2315  Outcome: Stayed in room and stabalized  Event End Time: 2345  Lonzo Saulter R

## 2017-09-27 NOTE — Transfer of Care (Signed)
Immediate Anesthesia Transfer of Care Note  Patient: Philip Thompson  Procedure(s) Performed: SUBTOTAL COLECTOMY AND ILEOSTOMY (N/A Abdomen)  Patient Location: ICU  Anesthesia Type:General  Level of Consciousness: sedated, unresponsive and Patient remains intubated per anesthesia plan  Airway & Oxygen Therapy: Patient remains intubated per anesthesia plan and Patient placed on Ventilator (see vital sign flow sheet for setting)  Post-op Assessment: Report given to RN and Post -op Vital signs reviewed and stable  Post vital signs: Reviewed and stable  Last Vitals:  Vitals Value Taken Time  BP    Temp    Pulse 91 09/27/2017  2:11 PM  Resp 14 09/27/2017  2:13 PM  SpO2 100 % 09/27/2017  2:11 PM  Vitals shown include unvalidated device data.  Last Pain:  Vitals:   09/27/17 0747  TempSrc:   PainSc: 9       Patients Stated Pain Goal: 0 (09/27/17 0747)  Complications: No apparent anesthesia complications

## 2017-09-27 NOTE — Progress Notes (Signed)
Patient is vomiting. Emesis is brown and thick with foul odor.   800 mL emptied in the suction canister.  MD notified.  Will continue to monitor the patient.

## 2017-09-27 NOTE — Progress Notes (Signed)
Received patient at 0800 and prepaired him for surgery, preprocedure check list, swab, CHG wipe down complete.

## 2017-09-27 NOTE — Progress Notes (Signed)
[facility]/Trauma Progress Note      Assessment/Plan Principal Problem: Ogilvie's syndrome Active Problems: MDD (major depressive disorder), recurrent severe, without psychosis (HCC) Leg edema Fecal impaction (HCC) Acute respiratory failure with hypoxia (HCC) Pulmonary embolism, bilateral (HCC) Hypothyroidism Colon distention  Megacolon with rectal fecal impaction -Colonic inertia versusshort segmentHirschsprung's diseasevspatient has a long-standing history of chronic narcotic abuse and chronic substance abuse.  - refused neostigmine and decompression.  - total abdominal colectomy with ileostomy today - WBC WNL, afebrile, tachycardic Acute b/l PE  - per medicine Emesis and possible aspiration - NGT in place to LIWS - empiric Zosyn to cover for possible aspiration   FEN:NPO, NGT VTE: SCD's,heparin held  BF:XOVAN 09/10>> Foley:none Follow up:TBD  Plan: total abdominal colectomy today, ICU postop, CCM already aware and appreciate their assistance     LOS: 5 days    Subjective: CC: abdominal pain  Pt had a rough night. Vomiting overnight. NGT placed. Increased pain.   Objective: Vital signs in last 24 hours: Temp:  [98.3 F (36.8 C)-98.6 F (37 C)] 98.6 F (37 C) (09/09 1551) Pulse Rate:  [45-148] 146 (09/10 0709) Resp:  [29-37] 37 (09/10 0709) BP: (124-150)/(88-114) 150/114 (09/10 0709) SpO2:  [90 %-99 %] 98 % (09/10 0709) Last BM Date: 08/18/17  Intake/Output from previous day: 09/09 0701 - 09/10 0700 In: 3234.7 [I.V.:2934.7; IV Piggyback:300] Out: 800 [Emesis/NG output:800] Intake/Output this shift: No intake/output data recorded.  PE: General appearance:somnolent and chronically ill/ cachectic Resp:rate increased, nonrebreather  Cardio: tachycardic VB:[id number],SFSE, absent BS,generalized TTP without peritonitis  Skin:No rashesnoted, warm and dry Extremities: severe BLE  edema Neurologic:Grossly normal  Anti-infectives: Anti-infectives (From admission, onward)   None      Lab Results:  Recent Labs    09/26/17 0626 09/27/17 0423  WBC 7.6 4.2  HGB 12.6* 13.5  HCT 38.3* 41.4  PLT 151 171   BMET Recent Labs    09/26/17 0626 09/27/17 0423  NA 139 137  K 3.6 4.1  CL 106 103  CO2 23 22  GLUCOSE 108* 125*  BUN 11 17  CREATININE 0.74 1.12  CALCIUM 8.9 9.2   PT/INR Recent Labs    09/26/17 0626  LABPROT 15.7*  INR 1.26   CMP     Component Value Date/Time   NA 137 09/27/2017 0423   K 4.1 09/27/2017 0423   CL 103 09/27/2017 0423   CO2 22 09/27/2017 0423   GLUCOSE 125 (H) 09/27/2017 0423   BUN 17 09/27/2017 0423   CREATININE 1.12 09/27/2017 0423   CALCIUM 9.2 09/27/2017 0423   PROT 6.5 09/24/2017 0016   ALBUMIN 3.6 09/24/2017 0016   AST 15 09/24/2017 0016   ALT 13 09/24/2017 0016   ALKPHOS 74 09/24/2017 0016   BILITOT 0.6 09/24/2017 0016   GFRNONAA >60 09/27/2017 0423   GFRAA >60 09/27/2017 0423   Lipase     Component Value Date/Time   LIPASE 37 09/21/2017 2237    Studies/Results: Dg Abd Acute W/chest  Result Date: 09/27/2017 CLINICAL DATA:  Nausea and vomiting EXAM: DG ABDOMEN ACUTE W/ 1V CHEST COMPARISON:  Chest two-view 09/21/2017.  Abdomen 09/21/2017 FINDINGS: Bibasilar airspace disease unchanged. This may represent atelectasis or pneumonia. No effusion. Decreased lung volume. NG tube in the stomach. Very large amount of stool throughout the colon similar to 09/22/2014. Large amount of stool in the rectum. No bowel obstruction or free air. The colon is distended and filled with stool diffusely. IMPRESSION: Hypoventilation.  Bibasilar atelectasis/infiltrate unchanged Extremely large amount  of stool throughout the colon. Colon diffusely distended similar to prior studies. Electronically Signed   By: Marlan Palau M.D.   On: 09/27/2017 07:34      Jerre Simon , [facility] Surgery 09/27/2017, 8:37  AM  Pager: [phone] Mon-Wed, Friday 7:00am-4:30pm Thurs 7am-11:30am  Consults: [phone]

## 2017-09-27 NOTE — Progress Notes (Signed)
12 " nasogastric tube placed in right nare.  While inserting the tube,  patient vomited large amounts of brown stool and started to desat rapidly .  Rapid response nurse placed the patient on non-rebreather while the NG tube was being secured by this nurse. NG tube suctioning properly.  MD notified of the change of condition.  Abg gases and chest x-ray ordered. Patient experiencing some relief . Will continue to monitor the patient and notify as needed

## 2017-09-27 NOTE — Progress Notes (Signed)
PROGRESS NOTE  AVANT PRINTY ZOX:[id number] DOB: 01-Jun-1990 DOA: 09/21/2017 PCP: [name], MD  Brief Narrative: 27 year old man PMH polysubstance abuse in remission, major depression, hypothyroidism, chronic constipation presenting with abdominal pain, massive abdominal distention, nausea, vomiting.  Known to have megacolon with massive stool retention unrelieved by Gastrografin enema.  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  GI recommended neostigmine.  Cardiology felt it best to transfer to [facility] for closer monitoring and cardiology support.  General surgery recommended colectomy but deferred until patient had heparin 5-7 days.  Patient ultimately declined neostigmine and colonic decompression.  Plan for subtotal colectomy and ileostomy 9/10.  Overnight early in the morning 9/10 patient vomited a large amount of stool, NG tube placed, plan for critical care involvement from transfer to ICU.  Assessment/Plan Refractory megacolon, Ogilvie syndrome.  Failed maximal medical therapy.  GI recommended neostigmine but because of the patient's PE, recommendation was made to transfer to [facility] for closer monitoring.  Given the risk of bradycardia or ventricular arrhythmias, especially in the setting of bilateral pulmonary embolism, the patient ultimately declined neostigmine.  He was offered colonic decompression by GI and also declined this. --Plan for surgery is for subtotal colectomy and ileostomy today.  Plan is for subtotal colectomy and ileostomy 9/10. --One family member asked if the patient could have ice now.  I explained in detail why this was contraindicated and prohibited.   Bilateral pulmonary emboli with acute hypoxic respiratory failure.   No evidence of heart strain by CT or echocardiogram.  Bilateral lower extremity venous Dopplers negative. EKG 9/6 ST. --Now with worsened  hypoxia status post massive vomiting of stool like serial.  Presumed aspiration.  Now on nonrebreather but oxygenation is acceptable, ABG pH within normal limits, patient mentating normally. --Critical care has been consulted, plan for transfer to ICU.  Massive bilateral lower extremity edema.  Venous Dopplers negative.  Likely secondary to impeded venous return from massive colonic distention. --Remains massively volume overloaded, expect this will improve with colonic decompression/colectomy.  Emphysema, subclinical.  CT suggested opacity airspace disease which in this clinical context may be related to infarcts.  No evidence clinically of pneumonia. --Has appeared asymptomatic throughout hospitalization  Hypothyroidism, started on Synthroid 50 mcg on discharge 9/2. --Continue levothyroxine.  PMH ADD, anxiety, major depressive disorder, polysubstance abuse,  --Citalopram, Depakote, quetiapine, trazodone, buspirone remain on hold, these can be started postoperatively when able.  Chronic back pain.  Gabapentin and cyclobenzaprine on hold.  Significant overnight event as described above.  Plan for critical care involvement, transfer to ICU, Dr. Toniann Fail discussed with general surgery  Dr. Dwain Sarna and Dr. Tonia Brooms PCCM  DVT prophylaxis:  Heparin infusion on hold for sufgery Code Status: Full Family Communication: Mother at bedside 9/10 Disposition Plan: home    Brendia Sacks, MD  [facility] Direct contact: [phone] --Via amion app OR  --[email or web address]; password [email or web address]  7PM-7AM contact night coverage as above 09/27/2017, 7:37 AM  LOS: 5 days   Consultants:  GI  General surgery  Cardiology   Procedures:  Echo Study Conclusions  - Left ventricle: The cavity size was normal. Wall thickness was   normal. Systolic function was normal. The estimated ejection   fraction was in the range of 55% to 60%. - Pericardium, extracardiac: A trivial pericardial effusion was    identified.  Antimicrobials:    Interval history/Subjective: Discussed with rapid response RN Puja and night  physician Dr. Toniann Fail.  Voluminous vomiting stool-like material overnight resulting in profound hypoxia, tachypnea and worsened tachycardia.  NG tube placed.  Placed on nonrebreather.  Currently patient's awake, breathing better.  Objective: Vitals:  Vitals:   09/27/17 0052 09/27/17 0200  BP: (!) 137/109 (!) 135/105  Pulse: (!) 133 (!) 136  Resp: (!) 30 (!) 30  Temp:    SpO2: 95% 93%    Exam: Constitutional:   . Appears acutely ill Eyes:  . irises appear normal ENMT:  . grossly normal hearing  Respiratory:  . CTA bilaterally, no w/r/r.  . Respiratory effort normal. Cardiovascular:  . Tachycardic, sinus tachycardia on telemetry, no m/r/g . No change 3+ bilateral LE extremity edema   Abdomen:  Marland Kitchen Massively distended . NG tube canister full with material consistent with stool Neurologic:  . Grossly non-focal Psychiatric:  . Mental status o Mood, affect appropriate . Alert, answers questions appropriately, appears to be mentating normally   I have personally reviewed the following:   Labs:  ABG 7.42/30 3/83  Basic metabolic panel unremarkable  Phosphorus slightly elevated, magnesium slightly low, anion gap within normal limits  CBC unremarkable  Acute abdominal series showed low lung volumes.  Atelectasis.  Large volume of stool.  Independently reviewed.  Scheduled Meds: . levothyroxine  37.5 mcg Intravenous Daily  . mouth rinse  15 mL Mouth Rinse BID  . milk and molasses  1 enema Rectal Once  . nicotine  21 mg Transdermal Daily  . sodium chloride flush  3 mL Intravenous Q12H   Continuous Infusions: . sodium chloride 100 mL/hr at 09/27/17 0157    Principal Problem:   Ogilvie's syndrome Active Problems:   MDD (major depressive disorder), recurrent severe, without psychosis (HCC)   Leg edema   Fecal impaction (HCC)   Acute respiratory  failure with hypoxia (HCC)   Acute pulmonary embolism without acute cor pulmonale (HCC)   Hypothyroidism   Colon distention   Preoperative cardiovascular examination   LOS: 5 days

## 2017-09-27 NOTE — Consult Note (Signed)
Philip Thompson  ZOX:[id number] DOB: [birth year] DOA: 09/21/2017 PCP: [name], MD    LOS: 5 days   Reason for Consult / Chief Complaint:  Pre-op acute hypoxic respiratory failure and Post operative critical care services   Consulting MD and date:  [name]  HPI/Summary of hospital stay:  27 year old male patient who was admitted on 9/4 with complete colonic inertia, megacolon and resultant severe obstructive constipation, further complicated by bilateral pulmonary emboli thought to be consequence of prior DVT secondary to pelvic vein obstruction.  Was admitted to the MedSurg unit with plans to proceed with subtotal colectomy following days of anticoagulation. 9/10: Vomited 800 mL of foul feculent appearing emesis.  Following this event saturations dropped to 84 to 86% on nasal cannula.  He vomited once again before and during NG tube placement with saturations as low as 70% requiring rapid escalation of supplemental oxygen.  Critical care was called to assess at bedside for acute hypoxic respiratory failure felt secondary to aspiration event.  On evaluation he was awake, tachycardic but saturating in the mid 90s on nonrebreather NG tube was intact.  We advised the best course of action was to proceed with surgery.  He returned to the intensive care postoperatively Orally intubated status post subtotal colectomy and ileostomy (see procedure section below). Critical care asked to assume care.   Significant medical history  Chronic narcotic abuse, chronic constipation.  Subjective:  Overly sedated on the ventilator  Objective   Blood pressure (Abnormal) 135/103, pulse (Abnormal) 140, temperature 98.6 F (37 C), temperature source Oral, resp. rate (Abnormal) 34, height 5' 7.99" (1.727 m), weight 105.9 kg, SpO2 98 %.        Intake/Output Summary (Last 24 hours) at 09/27/2017 1357 Last data filed at 09/27/2017 1349 Gross per 24 hour  Intake 7354.71 ml  Output 4750 ml  Net 2604.71 ml     Filed Weights   09/25/17 0700 09/26/17 0612 09/27/17 0939  Weight: 99.3 kg 105.9 kg 105.9 kg    Examination: General: Chronically ill-appearing 27 year old male he is currently sedated on propofol infusion postoperatively HENT: Cephalic atraumatic does have temporal wasting.  Orally intubated mucous membranes moist Lungs: Clear diminished bases equal chest rise on mechanically assisted breath Cardiovascular: Regular rate and rhythm without murmur rub or gallop abdomen: Soft.  Mid abdominal dressing intact.  Ileostomy with brisk capillary refill.  Hypoactive bowel sounds.  JP drain in place Extremities: Massive lower extremity edema bilaterally strong pulses Neuro: Remains sedated on ventilator GU: Clear yellow urine  Consults: date of consult/date signed off & final recs:  General surgery 9/6 GI 9/6 Critical care 9/10>>>  Procedures: Subtotal colectomy and ileostomy.  Completed on 9/10: She was found to have a massively distended colon from the cecum all the way to distal rectum and pelvic rim.  No evidence of perforation. Media Information    Oral endotracheal tube 9/10:>>>  Significant Diagnostic Tests: 9/5 CT chest, abdomen and pelvis: Bilateral pulmonary emboli without evidence of right heart strain.  Diffuse emphysematous changes in the lungs.  Patchy airspace disease.  Massively distended colon throughout.  Was filled with both gas and mostly stool.  There is a large amount of stool in the rectum with rectal wall thickening  Micro Data: Sputum culture 9/10>>>  Antimicrobials:  Zosyn 9/10 possible aspiration:>>>  Resolved Hospital Problem list   Colonic inertia and megacolon  Assessment & Plan:   S/p subtotal colectomy w/ end ileostomy for colonic inertia and megacolon 9/10  Plan Bowel rest NG tube to low intermittent suction Routine dressing change  Acute hypoxic respiratory failure following aspiration pneumonia superimposed on underlying pulmonary emboli and  decreased abdominal compliance -Patient chest x-ray showedRemarkably reduced lung volumes possible right basilar airspace disease massively distended colon with shifting of trachea Plan Chest x-ray now Continue full ventilator support Follow-up ABG Sputum culture VAP bundle PAD protocol RASS goal  -1 Place him on Zosyn empirically for aspiration  Bilateral pulmonary.  Likely provoked by lower extremity DVT in the setting of pelvic vein compression -Most recent lower extremity ultrasounds are negative for DVT -5 days unfractionated heparin.  Preoperative CT did not show evidence of RV strain Plan Holding IV heparin restart at 2130 pm tonight  Hypotension.  Favor primarily drug-related as well as post anesthesia related Plan Wean propofol Levophed for map greater than 65 Continue hydration w/ LR  Continue telemetry monitoring  Fluid and electrolyte imbalance: Hypomagnesemia.  At risk for significant intravascular volume shifts Plan Serial blood chemistries Replace electrolytes as indicated  Anemia of chronic disease. Received 2 units of blood and FFP intraoperatively.  Preoperative hemoglobin 13.5 Plan Trend CBC Resume anticoagulation 2130 tonight no bolus   Severe protein calorie malnutrition Plan N.p.o. for now We will discuss with surgical services timing and route of nutritional support  Hypothyroidism Plan Continue Synthroid supplementation  Massive lower extremity edema.  Felt secondary to pelvic vein obstruction due to massive abdomen. -Would expect this to improve some now that abdomen is decompressed -No  DVT appreciated Plan Observe  History of attention deficit disorder, anxiety, depression, and polysubstance abuse.  Also chronic back pain Plan Holding Depakote, quetiapine, trazodone, buspirone and gabapentin currently.  These will need to be reassessed once we can use GI route   Disposition / Summary of Today's Plan 09/27/17   Status post subtotal  colectomy with ileostomy.  Intraoperatively received 2 units of PRBC and 2 FFP returns to the intensive care intubated, still on Neo-Synephrine, and sedated on propofol infusion.  Currently he is hemodynamically stable otherwise.  Plan at this point is to rest him overnight.  Obtain sputum culture, follow-up chest x-ray, follow-up arterial blood gas, provide gentle IV hydration, and we will empirically treat him for aspiration pneumonia.  We will continue to wean FiO2 the evening, restart heparin at 930 this evening, and aim for a RASS goal of -1.  If no issues overnight we will initiate spontaneous breathing trial in the morning.  Best Practice / Goals of Care / Disposition.   DVT prophylaxis: SCD; IV heparin NO BOLUS 2130 9/10 GI prophylaxis: H2B Diet: NPO Mobility:BR, advance as able  Code Status: full code  Family Communication: updated   Labs   CBC: Recent Labs  Lab 09/23/17 0529 09/24/17 0016 09/25/17 0251 09/26/17 0626 09/27/17 0423  WBC 5.9 7.3 5.9 7.6 4.2  HGB 12.3* 12.8* 11.7* 12.6* 13.5  HCT 37.4* 39.6 36.0* 38.3* 41.4  MCV 84.4 84.8 84.7 84.9 84.5  PLT 136* 165 128* 151 171   Basic Metabolic Panel: Recent Labs  Lab 09/21/17 2237  09/24/17 0016 09/24/17 1135 09/25/17 0251 09/26/17 0626 09/27/17 0423  NA 139   < > 140 138 139 139 137  K 3.7   < > 3.2* 3.5 3.4* 3.6 4.1  CL 103   < > 102 104 106 106 103  CO2 27   < > 28 26 24 23 22   GLUCOSE 105*   < > 108* 102* 110* 108* 125*  BUN 10   < >  11 11 11 11 17   CREATININE 0.66   < > 0.80 0.81 0.64 0.74 1.12  CALCIUM 9.9   < > 9.5 9.2 9.1 8.9 9.2  MG 1.9  --  1.8 1.7  --  1.7 1.6*  PHOS  --   --   --  3.7  --  4.3 4.7*   < > = values in this interval not displayed.   GFR: Estimated Creatinine Clearance: 116.9 mL/min (by C-G formula based on SCr of 1.12 mg/dL). Recent Labs  Lab 09/21/17 2304  09/24/17 0016 09/25/17 0251 09/26/17 0626 09/27/17 0423  WBC  --    < > 7.3 5.9 7.6 4.2  LATICACIDVEN 1.06  --   --   --    --   --    < > = values in this interval not displayed.   Liver Function Tests: Recent Labs  Lab 09/21/17 2237 09/24/17 0016  AST 18 15  ALT 13 13  ALKPHOS 92 74  BILITOT 0.8 0.6  PROT 8.2* 6.5  ALBUMIN 4.3 3.6   Recent Labs  Lab 09/21/17 2237  LIPASE 37   No results for input(s): AMMONIA in the last 168 hours. ABG    Component Value Date/Time   PHART 7.424 09/27/2017 0641   PCO2ART 33.4 09/27/2017 0641   PO2ART 83.2 09/27/2017 0641   HCO3 21.5 09/27/2017 0641   TCO2 19.3 08/28/2013 0100   ACIDBASEDEF 2.2 (H) 09/27/2017 0641   O2SAT 95.5 09/27/2017 0641    Coagulation Profile: Recent Labs  Lab 09/21/17 2237 09/26/17 0626  INR 1.10 1.26   Cardiac Enzymes: Recent Labs  Lab 09/22/17 0212 09/24/17 0016  TROPONINI <0.03 <0.03   HbA1C: Hgb A1c MFr Bld  Date/Time Value Ref Range Status  03/27/2017 06:54 AM 5.4 4.8 - 5.6 % Final    Comment:    (NOTE) Pre diabetes:          5.7%-6.4% Diabetes:              >6.4% Glycemic control for   <7.0% adults with diabetes   07/02/2016 07:34 AM 5.4 4.8 - 5.6 % Final    Comment:    (NOTE)         Pre-diabetes: 5.7 - 6.4         Diabetes: >6.4         Glycemic control for adults with diabetes: <7.0    CBG: No results for input(s): GLUCAP in the last 168 hours.   Review of Systems:    Past medical history  He,  has a past medical history of ADD (attention deficit disorder), Aggression, Allergic rhinitis, Anxiety, Arthritis, Cardiomyopathy (HCC), Chronic back pain, Constipation, Depression, Fecal obstruction (HCC), Frontal head injury, Heroin overdose (HCC), Obstipation, Polysubstance abuse (HCC), and Suicide attempt (HCC).   Surgical History    Past Surgical History:  Procedure Laterality Date  . FOOT SURGERY     to get a BB out  . NO PAST SURGERIES       Social History   Social History   Socioeconomic History  . Marital status: Single    Spouse name: Not on file  . Number of children: Not on file    . Years of education: Not on file  . Highest education level: 12th grade  Occupational History  . Not on file  Social Needs  . Financial resource strain: Somewhat hard  . Food insecurity:    Worry: Never true    Inability: Never true  .  Transportation needs:    Medical: No    Non-medical: No  Tobacco Use  . Smoking status: Current Every Day Smoker    Packs/day: 1.00    Years: 7.00    Pack years: 7.00    Types: Cigarettes  . Smokeless tobacco: Never Used  Substance and Sexual Activity  . Alcohol use: Not Currently    Comment: hx of. last use 01/2016  . Drug use: Yes    Types: Marijuana, Cocaine    Comment: last used marijuana 3 weeks ago. heroin last used  6 months ago.   Marland Kitchen Sexual activity: Yes    Birth control/protection: Condom  Lifestyle  . Physical activity:    Days per week: 0 days    Minutes per session: 0 min  . Stress: To some extent  Relationships  . Social connections:    Talks on phone: More than three times a week    Gets together: Twice a week    Attends religious service: Never    Active member of club or organization: No    Attends meetings of clubs or organizations: Never    Relationship status: Never married  . Intimate partner violence:    Fear of current or ex partner: No    Emotionally abused: No    Physically abused: No    Forced sexual activity: No  Other Topics Concern  . Not on file  Social History Narrative  . Not on file  ,  reports that he has been smoking cigarettes. He has a 7.00 pack-year smoking history. He has never used smokeless tobacco. He reports that he drank alcohol. He reports that he has current or past drug history. Drugs: Marijuana and Cocaine.   Family history   His family history includes Alcohol abuse in his father; Cancer in his paternal grandfather; Cancer - Lung in his paternal grandmother; Cancer - Ovarian in his maternal grandmother; Dementia in his father; Diabetes in his brother and other; Heart attack in his  other; Heart disease in his other; Hypertension in his other.   Allergies No Known Allergies  Home meds  Prior to Admission medications   Medication Sig Start Date End Date Taking? Authorizing Provider  busPIRone (BUSPAR) 15 MG tablet Take 1 tablet (15 mg total) by mouth 3 (three) times daily. 09/07/17  Yes Eksir, Bo Mcclintock, MD  citalopram (CELEXA) 40 MG tablet Take 1 tablet (40 mg total) by mouth daily. 09/07/17 09/07/18 Yes Eksir, Bo Mcclintock, MD  cyclobenzaprine (FLEXERIL) 10 MG tablet Take 1 tablet (10 mg total) by mouth 3 (three) times daily as needed for muscle spasms. 03/29/17  Yes Armandina Stammer I, NP  divalproex (DEPAKOTE) 500 MG DR tablet Take 1 tablet (500 mg total) by mouth every 12 (twelve) hours. For mood stabilization 09/07/17  Yes Eksir, Bo Mcclintock, MD  docusate sodium (COLACE) 100 MG capsule Take 1 capsule (100 mg total) by mouth 2 (two) times daily. Patient taking differently: Take 100 mg by mouth daily.  09/19/17  Yes Tat, Onalee Hua, MD  furosemide (LASIX) 20 MG tablet Take 1 tablet (20 mg total) by mouth daily. Prn leg swelling Patient taking differently: Take 20 mg by mouth daily as needed for fluid or edema. As needed for leg swelling 05/11/17  Yes Sherie Don C, NP  gabapentin (NEURONTIN) 300 MG capsule Take 2 capsules (600 mg total) by mouth 3 (three) times daily. For agitation 09/07/17 09/07/18 Yes Eksir, Bo Mcclintock, MD  ibuprofen (ADVIL,MOTRIN) 600 MG tablet Take 1 tablet (600  mg total) by mouth every 6 (six) hours as needed for moderate pain. (May buy from over the counter): For pain 03/29/17  Yes Armandina Stammer I, NP  levothyroxine (SYNTHROID) 50 MCG tablet Take 1 tablet (50 mcg total) by mouth daily before breakfast. 09/19/17  Yes Tat, Onalee Hua, MD  linaclotide [facility]) 290 MCG CAPS capsule Take 1 capsule (290 mcg total) by mouth daily before breakfast. 09/20/17  Yes Tat, Onalee Hua, MD  potassium chloride (K-DUR,KLOR-CON) 10 MEQ tablet Take 1 tablet (10 mEq total) by mouth  daily. With furosemide Patient taking differently: Take 10 mEq by mouth daily as needed (only takes when Lasix (Furosemide)). With furosemide 05/11/17  Yes Campbell Riches, NP  QUEtiapine (SEROQUEL) 400 MG tablet Take 2 tablets (800 mg total) by mouth at bedtime. 09/07/17  Yes Eksir, Bo Mcclintock, MD  traMADol (ULTRAM) 50 MG tablet Take 50 mg by mouth every 12 (twelve) hours as needed for moderate pain or severe pain.  05/03/17  Yes [provider]  traZODone (DESYREL) 100 MG tablet Take 1 tablet (100 mg total) by mouth at bedtime. 09/07/17  Yes Eksir, Bo Mcclintock, MD      Simonne Martinet ACNP-BC [facility] Pulmonary/Critical Care Pager # [phone] OR # [phone] if no answer

## 2017-09-27 NOTE — Anesthesia Procedure Notes (Signed)
Procedure Name: Intubation Date/Time: 09/27/2017 10:49 AM Performed by: Adria Dill, CRNA Pre-anesthesia Checklist: Patient identified, Emergency Drugs available, Suction available and Patient being monitored Patient Re-evaluated:Patient Re-evaluated prior to induction Oxygen Delivery Method: Circle system utilized Preoxygenation: Pre-oxygenation with 100% oxygen Induction Type: IV induction, Cricoid Pressure applied and Rapid sequence Laryngoscope Size: Miller and 2 Grade View: Grade I Tube type: Subglottic suction tube Tube size: 8.0 mm Number of attempts: 1 Airway Equipment and Method: Rigid stylet Placement Confirmation: ETT inserted through vocal cords under direct vision,  positive ETCO2,  CO2 detector and breath sounds checked- equal and bilateral Secured at: 23 cm Tube secured with: Tape Dental Injury: Teeth and Oropharynx as per pre-operative assessment

## 2017-09-27 NOTE — Progress Notes (Signed)
ANTICOAGULATION CONSULT NOTE - Follow Up Consult  Pharmacy Consult for Heparin Indication: pulmonary embolus  No Known Allergies  Patient Measurements: Height: 5' 7.99" (172.7 cm) Weight: 233 lb 8 oz (105.9 kg) IBW/kg (Calculated) : 68.38 Heparin Dosing Weight: 91.6 kg  Vital Signs: Temp: 97.5 F (36.4 C) (09/10 1618) Temp Source: Axillary (09/10 1618) BP: 138/87 (09/10 1700) Pulse Rate: 100 (09/10 1700)  Labs: Recent Labs    09/25/17 1856 09/26/17 0626 09/27/17 0423 09/27/17 1226 09/27/17 1613  HGB  --  12.6* 13.5 9.5* 13.1  HCT  --  38.3* 41.4 28.0* 39.5  PLT  --  151 171  --  129*  LABPROT  --  15.7*  --   --   --   INR  --  1.26  --   --   --   HEPARINUNFRC 0.42 0.43 0.13*  --   --   CREATININE  --  0.74 1.12  --  0.86    Estimated Creatinine Clearance: 152.2 mL/min (by C-G formula based on SCr of 0.86 mg/dL).   Assessment: Anticoag: Radiology results show acute pulmonary embolism (This is felt related to GI issue/ impediment in venous return ) . Last HL: 0.43 on 2050 units/hr prior to stopping for surgery. Hgb 13.1.  platelets: down to 129. Transfused 2 units PRBCs and FFP in OR. Surgery orders to resume IV Heparin (no bolus) at 2130 PM and monitor closely for bleeding.   Goal of Therapy:  Heparin level 0.3-0.7 units/ml Monitor platelets by anticoagulation protocol: Yes   Plan:  Restart heparin infusion at 2050 units/hr at 21:30PM. No bolus. Check heparin level in 6 hours along with CBC.  Monitor for post-operative bleeding.   Link Snuffer, PharmD, BCPS, BCCCP Clinical Pharmacist Clinical phone 09/27/2017 until 10:30PM - #[phone] Please refer to [facility] for [facility] numbers 09/27/2017,5:34 PM

## 2017-09-28 ENCOUNTER — Inpatient Hospital Stay: Payer: Self-pay

## 2017-09-28 ENCOUNTER — Other Ambulatory Visit: Payer: Self-pay

## 2017-09-28 ENCOUNTER — Inpatient Hospital Stay (HOSPITAL_COMMUNITY): Payer: BLUE CROSS/BLUE SHIELD

## 2017-09-28 ENCOUNTER — Encounter (HOSPITAL_COMMUNITY): Payer: Self-pay | Admitting: Surgery

## 2017-09-28 DIAGNOSIS — E43 Unspecified severe protein-calorie malnutrition: Secondary | ICD-10-CM

## 2017-09-28 LAB — COMPREHENSIVE METABOLIC PANEL
ALK PHOS: 58 U/L (ref 38–126)
ALT: 25 U/L (ref 0–44)
ANION GAP: 10 (ref 5–15)
AST: 29 U/L (ref 15–41)
Albumin: 2.6 g/dL — ABNORMAL LOW (ref 3.5–5.0)
BUN: 15 mg/dL (ref 6–20)
CHLORIDE: 102 mmol/L (ref 98–111)
CO2: 26 mmol/L (ref 22–32)
Calcium: 8.1 mg/dL — ABNORMAL LOW (ref 8.9–10.3)
Creatinine, Ser: 0.98 mg/dL (ref 0.61–1.24)
GFR calc Af Amer: >60 mL/min (ref >60)
GFR calc non Af Amer: >60 mL/min (ref >60)
GLUCOSE: 110 mg/dL — AB (ref 70–99)
POTASSIUM: 4.4 mmol/L (ref 3.5–5.1)
Sodium: 138 mmol/L (ref 135–145)
Total Bilirubin: 0.8 mg/dL (ref 0.3–1.2)
Total Protein: 5 g/dL — ABNORMAL LOW (ref 6.5–8.1)

## 2017-09-28 LAB — POCT I-STAT 3, ART BLOOD GAS (G3+)
Acid-Base Excess: 2 mmol/L (ref 0.0–2.0)
Bicarbonate: 28 mmol/L (ref 20.0–28.0)
O2 SAT: 99 %
PCO2 ART: 47.8 mmHg (ref 32.0–48.0)
PO2 ART: 165 mmHg — AB (ref 83.0–108.0)
Patient temperature: 99.8
TCO2: 29 mmol/L (ref 22–32)
pH, Arterial: 7.378 (ref 7.350–7.450)

## 2017-09-28 LAB — CBC
HEMATOCRIT: 37.1 % — AB (ref 39.0–52.0)
HEMATOCRIT: 28.9 % — AB (ref 39.0–52.0)
HEMOGLOBIN: 12.3 g/dL — AB (ref 13.0–17.0)
HEMOGLOBIN: 9.4 g/dL — AB (ref 13.0–17.0)
MCH: 28.3 pg (ref 26.0–34.0)
MCH: 28.8 pg (ref 26.0–34.0)
MCHC: 33.2 g/dL (ref 30.0–36.0)
MCHC: 32.5 g/dL (ref 30.0–36.0)
MCV: 85.5 fL (ref 78.0–100.0)
MCV: 88.7 fL (ref 78.0–100.0)
Platelets: 162 10*3/uL (ref 150–400)
Platelets: 103 10*3/uL — ABNORMAL LOW (ref 150–400)
RBC: 4.34 MIL/uL (ref 4.22–5.81)
RBC: 3.26 MIL/uL — AB (ref 4.22–5.81)
RDW: 15 % (ref 11.5–15.5)
RDW: 14.5 % (ref 11.5–15.5)
WBC: 8.2 10*3/uL (ref 4.0–10.5)
WBC: 4.4 10*3/uL (ref 4.0–10.5)

## 2017-09-28 LAB — PREPARE FRESH FROZEN PLASMA

## 2017-09-28 LAB — BPAM FFP
Blood Product Expiration Date: 201909142359
Blood Product Expiration Date: 201909152359
ISSUE DATE / TIME: 201909101130
ISSUE DATE / TIME: 201909101203
UNIT TYPE AND RH: 7300
Unit Type and Rh: 7300

## 2017-09-28 LAB — HEPARIN LEVEL (UNFRACTIONATED)
Heparin Unfractionated: 0.27 IU/mL — ABNORMAL LOW (ref 0.30–0.70)
Heparin Unfractionated: 0.35 IU/mL (ref 0.30–0.70)

## 2017-09-28 LAB — PREALBUMIN: Prealbumin: <5 mg/dL — ABNORMAL LOW (ref 18–38)

## 2017-09-28 LAB — PHOSPHORUS: Phosphorus: 3.6 mg/dL (ref 2.5–4.6)

## 2017-09-28 LAB — MAGNESIUM: Magnesium: 1.9 mg/dL (ref 1.7–2.4)

## 2017-09-28 MED ORDER — SODIUM CHLORIDE 0.9% FLUSH
10.0000 mL | INTRAVENOUS | Status: DC | PRN
  Administered 2017-10-04 – 2017-10-20 (×3): 10 mL
  Filled 2017-09-28 (×3): qty 40

## 2017-09-28 MED ORDER — CHLORHEXIDINE GLUCONATE CLOTH 2 % EX PADS
6.0000 | MEDICATED_PAD | Freq: Every day | CUTANEOUS | Status: DC
  Administered 2017-09-28 – 2017-10-20 (×21): 6 via TOPICAL

## 2017-09-28 MED ORDER — SODIUM CHLORIDE 0.9% FLUSH
10.0000 mL | Freq: Two times a day (BID) | INTRAVENOUS | Status: DC
  Administered 2017-09-28 – 2017-10-15 (×31): 10 mL
  Administered 2017-10-15: 20 mL
  Administered 2017-10-16 – 2017-10-21 (×8): 10 mL

## 2017-09-28 NOTE — Progress Notes (Signed)
Philip Thompson  ZOX:[id number] DOB: Jul 01, 1990 DOA: 09/21/2017 PCP: [name], MD    LOS: 6 days   Reason for Consult / Chief Complaint:  Pre-op acute hypoxic respiratory failure and Post operative critical care services   Consulting MD and date:  [name]  HPI/Summary of hospital stay:  27 year old male patient who was admitted on 9/4 with complete colonic inertia, megacolon and resultant severe obstructive constipation, further complicated by bilateral pulmonary emboli thought to be consequence of prior DVT secondary to pelvic vein obstruction.  Was admitted to the MedSurg unit with plans to proceed with subtotal colectomy following days of anticoagulation. 9/10: Vomited 800 mL of foul feculent appearing emesis.  Following this event saturations dropped to 84 to 86% on nasal cannula.  He vomited once again before and during NG tube placement with saturations as low as 70% requiring rapid escalation of supplemental oxygen.  Critical care was called to assess at bedside for acute hypoxic respiratory failure felt secondary to aspiration event.  On evaluation he was awake, tachycardic but saturating in the mid 90s on nonrebreather NG tube was intact.  We advised the best course of action was to proceed with surgery.  He returned to the intensive care postoperatively Orally intubated status post subtotal colectomy and ileostomy (see procedure section below). Critical care asked to assume care.    Subjective:  RN reports pt weaning on PSV since early am.  No acute events overnight.  WOC RN following > leaking ileostomy bag changed.  Concern for new rash vs bruising to left thigh.    Objective   Blood pressure 135/90, pulse (!) 115, temperature 98.7 F (37.1 C), temperature source Oral, resp. rate 12, height 5' 7.99" (1.727 m), weight 99.1 kg, SpO2 100 %.    Vent Mode: CPAP;PSV FiO2 (%):  [40 %] 40 % Set Rate:  [14 bmp] 14 bmp Vt Set:  [530 mL-620 mL] 530 mL PEEP:  [5 cmH20] 5  cmH20 Pressure Support:  [10 cmH20] 10 cmH20 Plateau Pressure:  [13 cmH20-20 cmH20] 15 cmH20   Intake/Output Summary (Last 24 hours) at 09/28/2017 1122 Last data filed at 09/28/2017 1100 Gross per 24 hour  Intake 5001.99 ml  Output 5860 ml  Net -858.01 ml   Filed Weights   09/26/17 0612 09/27/17 0939 09/28/17 0500  Weight: 105.9 kg 105.9 kg 99.1 kg    Examination: General:  [name] lying in bed, appears ill / deconditioned  HEENT: MM pink/moist, ETT Neuro: Awakens to voice, follows commands, mild agitation intermittently, MAE CV: s1s2 rrr, no m/r/g, tachycardic  PULM: even/non-labored, lungs bilaterally clear  WJ:XBJY, non-tender, bsx4 active  Extremities: warm to touch / dry, 2+ BLE pitting edema Skin: right lateral thigh with purple discoloration, non-blanchable (bruise)  Consults: date of consult/date signed off & final recs:  General surgery 9/6 GI 9/6 Critical care 9/10   Procedures: 9/10 - Subtotal colectomy and ileostomy.  Was found to have a massively distended colon from the cecum all the way to distal rectum and pelvic rim.  No evidence of perforation. Media Information    Oral endotracheal tube 9/10 >>  Significant Diagnostic Tests: 9/5 CT chest, abdomen and pelvis: Bilateral pulmonary emboli without evidence of right heart strain.  Diffuse emphysematous changes in the lungs.  Patchy airspace disease.  Massively distended colon throughout.  Was filled with both gas and mostly stool.  There is a large amount of stool in the rectum with rectal wall thickening  Micro  Data: Sputum culture 9/10 >>  Antimicrobials:  Zosyn (possible aspiration) 9/10 >>  Resolved Hospital Problem list   Colonic inertia and megacolon s/p subtotal colectomy  Assessment & Plan:   S/p subtotal colectomy w/ end ileostomy for colonic inertia and megacolon 9/10 P: Bowel rest for now.  Discussed possible trickle feeding in am 9/11 pending review of NGT drainage  Begin TNA via  PICC  WOC following for ileostomy  Post op surgical care per CCS  Acute hypoxic respiratory failure following aspiration pneumonia superimposed on underlying pulmonary emboli and decreased abdominal compliance -Patient chest x-ray showedRemarkably reduced lung volumes possible right basilar airspace disease massively distended colon with shifting of trachea P: PRVC as rest mode PSV for possible extubation 9/11  Follow cultures  Minimize sedation  Aspiration coverage with zosyn   Bilateral Pulmonary Emboli.  Likely provoked by lower extremity DVT in the setting of pelvic vein compression -Most recent lower extremity ultrasounds are negative for DVT -5 days unfractionated heparin.  Preoperative CT did not show evidence of RV strain P: Continue IV heparin per pharmacy  Monitor for bleeding  Trend CBC  Hypotension -favor primarily drug-related as well as post anesthesia related, resolved P: Wean sedation to off  LR at 34ml/hr   Fluid and electrolyte imbalance: Hypomagnesemia.  At risk for significant intravascular volume shifts P: Follow BMP Replace electrolytes as indicated   Anemia of chronic disease. Received 2 units of blood and FFP intraoperatively.  Preoperative hemoglobin 13.5 P: Transfuse per ICU guidelines  Monitor on heparin gtt   Severe protein calorie malnutrition P: Begin TNA now as he is malnourished / will not likely be able to tolerate full feeding for days Consider trickle feeding as above 9/12 pending NGT output review   Hypothyroidism P: Continue synthroid   Massive lower extremity edema.  Felt secondary to pelvic vein obstruction due to massive abdomen. -Would expect this to improve some now that abdomen is decompressed -No DVT appreciated P: Monitor   History of attention deficit disorder, anxiety, depression, and polysubstance abuse.  Also chronic back pain P: Hold home depakote, quetiapine, trazodone, buspar, gabapentin    Disposition /  Summary of Today's Plan 09/28/17   PSV wean for possible extubation.  Wean sedation.  Will need pain control if extubation / off continuous gtt's.     Best Practice / Goals of Care / Disposition.   DVT prophylaxis: SCD; IV heparin NO BOLUS 2130 9/10 GI prophylaxis: H2B Diet: NPO Mobility:BR, advance as able  Code Status: full code  Family Communication: no family at bedside.   Labs   CBC: Recent Labs  Lab 09/26/17 0626 09/27/17 0423 09/27/17 1226 09/27/17 1613 09/27/17 1803 09/28/17 0328  WBC 7.6 4.2  --  5.8 8.6 8.2  HGB 12.6* 13.5 9.5* 13.1 12.8* 12.3*  HCT 38.3* 41.4 28.0* 39.5 39.4 37.1*  MCV 84.9 84.5  --  84.9 86.4 85.5  PLT 151 171  --  129* 141* 162   Basic Metabolic Panel: Recent Labs  Lab 09/24/17 0016 09/24/17 1135 09/25/17 0251 09/26/17 0626 09/27/17 0423 09/27/17 1226 09/27/17 1613 09/28/17 0328  NA 140 138 139 139 137 138 138 138  K 3.2* 3.5 3.4* 3.6 4.1 4.5 4.2 4.4  CL 102 104 106 106 103  --  104 102  CO2 28 26 24 23 22   --  19* 26  GLUCOSE 108* 102* 110* 108* 125* 107* 113* 110*  BUN 11 11 11 11 17   --  20 15  CREATININE 0.80 0.81 0.64 0.74 1.12  --  0.86 0.98  CALCIUM 9.5 9.2 9.1 8.9 9.2  --  8.4* 8.1*  MG 1.8 1.7  --  1.7 1.6*  --   --  1.9  PHOS  --  3.7  --  4.3 4.7*  --   --  3.6   GFR: Estimated Creatinine Clearance: 129.2 mL/min (by C-G formula based on SCr of 0.98 mg/dL). Recent Labs  Lab 09/21/17 2304  09/27/17 0423 09/27/17 1613 09/27/17 1803 09/28/17 0328  WBC  --    < > 4.2 5.8 8.6 8.2  LATICACIDVEN 1.06  --   --   --   --   --    < > = values in this interval not displayed.   Liver Function Tests: Recent Labs  Lab 09/21/17 2237 09/24/17 0016 09/28/17 0328  AST 18 15 29   ALT 13 13 25   ALKPHOS 92 74 58  BILITOT 0.8 0.6 0.8  PROT 8.2* 6.5 5.0*  ALBUMIN 4.3 3.6 2.6*   Recent Labs  Lab 09/21/17 2237  LIPASE 37   No results for input(s): AMMONIA in the last 168 hours.   ABG    Component Value Date/Time    PHART 7.378 09/28/2017 0355   PCO2ART 47.8 09/28/2017 0355   PO2ART 165.0 (H) 09/28/2017 0355   HCO3 28.0 09/28/2017 0355   TCO2 29 09/28/2017 0355   ACIDBASEDEF 1.0 09/27/2017 1551   O2SAT 99.0 09/28/2017 0355    Coagulation Profile: Recent Labs  Lab 09/21/17 2237 09/26/17 0626  INR 1.10 1.26   Cardiac Enzymes: Recent Labs  Lab 09/22/17 0212 09/24/17 0016  TROPONINI <0.03 <0.03   HbA1C: Hgb A1c MFr Bld  Date/Time Value Ref Range Status  03/27/2017 06:54 AM 5.4 4.8 - 5.6 % Final    Comment:    (NOTE) Pre diabetes:          5.7%-6.4% Diabetes:              >6.4% Glycemic control for   <7.0% adults with diabetes   07/02/2016 07:34 AM 5.4 4.8 - 5.6 % Final    Comment:    (NOTE)         Pre-diabetes: 5.7 - 6.4         Diabetes: >6.4         Glycemic control for adults with diabetes: <7.0    CBG: No results for input(s): GLUCAP in the last 168 hours.  Canary Brim, NP-C [facility] Pgr: [phone] or if no answer [phone] 09/28/2017, 11:22 AM

## 2017-09-28 NOTE — Progress Notes (Signed)
Peripherally Inserted Central Catheter/Midline Placement  The IV Nurse has discussed with the patient and/or persons authorized to consent for the patient, the purpose of this procedure and the potential benefits and risks involved with this procedure.  The benefits include less needle sticks, lab draws from the catheter, and the patient may be discharged home with the catheter. Risks include, but not limited to, infection, bleeding, blood clot (thrombus formation), and puncture of an artery; nerve damage and irregular heartbeat and possibility to perform a PICC exchange if needed/ordered by physician.  Alternatives to this procedure were also discussed.  Bard Power PICC patient education guide, fact sheet on infection prevention and patient information card has been provided to patient /or left at bedside.  Mother signed consent.  PICC/Midline Placement Documentation  PICC Triple Lumen 09/28/17 PICC Right Basilic 38 cm 1 cm (Active)  Indication for Insertion or Continuance of Line Administration of hyperosmolar/irritating solutions (i.e. TPN, Vancomycin, etc.) 09/28/2017  2:54 PM  Exposed Catheter (cm) 1 cm 09/28/2017  2:54 PM  Site Assessment Clean;Dry;Intact 09/28/2017  2:54 PM  Lumen #1 Status Flushed;Saline locked;Blood return noted 09/28/2017  2:54 PM  Lumen #2 Status Flushed;Saline locked;Blood return noted 09/28/2017  2:54 PM  Lumen #3 Status Flushed;Saline locked;Blood return noted 09/28/2017  2:54 PM  Dressing Type Transparent 09/28/2017  2:54 PM  Dressing Status Clean;Dry;Antimicrobial disc in place;Intact 09/28/2017  2:54 PM  Dressing Change Due 10/05/17 09/28/2017  2:54 PM       Ethelda Chick 09/28/2017, 2:55 PM

## 2017-09-28 NOTE — Progress Notes (Signed)
Dr. Magnus Ivan made aware of high output of JP drain and increased drainage around incision.    Will continue to monitor closely.

## 2017-09-28 NOTE — Progress Notes (Signed)
ANTICOAGULATION CONSULT NOTE - Follow Up Consult  Pharmacy Consult for heparin Indication: pulmonary embolus  Labs: Recent Labs    09/26/17 0626 09/27/17 0423  09/27/17 1613 09/27/17 1803 09/28/17 0328  HGB 12.6* 13.5   < > 13.1 12.8* 12.3*  HCT 38.3* 41.4   < > 39.5 39.4 37.1*  PLT 151 171  --  129* 141* 162  LABPROT 15.7*  --   --   --   --   --   INR 1.26  --   --   --   --   --   HEPARINUNFRC 0.43 0.13*  --   --   --  0.27*  CREATININE 0.74 1.12  --  0.86  --   --    < > = values in this interval not displayed.    Assessment/Plan:  27yo male slightly subtherapeutic on heparin after resumed post-op but expect level to continue to increase; RN reports no post-op bleeding. Will continue gtt at current rate and check additional level.   Vernard Gambles, PharmD, BCPS  09/28/2017,4:12 AM

## 2017-09-28 NOTE — Progress Notes (Signed)
PHARMACY - ADULT TOTAL PARENTERAL NUTRITION CONSULT NOTE   Pharmacy Consult for TPN Indication: massive bowel resection  Patient Measurements: Height: 5' 7.99" (172.7 cm) Weight: 218 lb 7.6 oz (99.1 kg) IBW/kg (Calculated) : 68.38 TPN AdjBW (KG): 77.8 Body mass index is 33.23 kg/m.   Due to timing of TPN consult, the patient's TPN will begin on 9/12.   Ambrosio Reuter, Darl Householder 09/28/2017,2:20 PM

## 2017-09-28 NOTE — Progress Notes (Signed)
ANTICOAGULATION CONSULT NOTE - Follow Up Consult  Pharmacy Consult for Heparin Indication: pulmonary embolus  No Known Allergies  Patient Measurements: Height: 5' 7.99" (172.7 cm) Weight: 218 lb 7.6 oz (99.1 kg) IBW/kg (Calculated) : 68.38 Heparin Dosing Weight: 91.6 kg  Vital Signs: Temp: 98.7 F (37.1 C) (09/11 1114) Temp Source: Oral (09/11 1114) BP: 135/90 (09/11 1100) Pulse Rate: 115 (09/11 1100)  Labs: Recent Labs    09/26/17 0626 09/27/17 0423  09/27/17 1613 09/27/17 1803 09/28/17 0328 09/28/17 0957  HGB 12.6* 13.5   < > 13.1 12.8* 12.3*  --   HCT 38.3* 41.4   < > 39.5 39.4 37.1*  --   PLT 151 171  --  129* 141* 162  --   LABPROT 15.7*  --   --   --   --   --   --   INR 1.26  --   --   --   --   --   --   HEPARINUNFRC 0.43 0.13*  --   --   --  0.27* 0.35  CREATININE 0.74 1.12  --  0.86  --  0.98  --    < > = values in this interval not displayed.    Estimated Creatinine Clearance: 129.2 mL/min (by C-G formula based on SCr of 0.98 mg/dL).   Assessment: [name] w/ acute pulmonary embolism (This is felt related to GI issue / impediment in venous return). CBC has remained stable post-op. RN reports no bleeding.  Heparin level therapeutic at 0.35  Goal of Therapy:  Heparin level 0.3-0.7 units/ml Monitor platelets by anticoagulation protocol: Yes   Plan:  Continue heparin infusion at 2050 units/hr (no boluses) Check daily heparin level along with CBC.  Continue to monitor for post-operative bleeding.   Plan for Xarelto at discharge per cardiology  Danae Orleans, PharmD PGY1 Pharmacy Resident Phone [phone] 09/28/2017       11:19 AM

## 2017-09-28 NOTE — Progress Notes (Signed)
1 Day Post-Op   Subjective/Chief Complaint: Intubated and sedated No issues overnight High output from NG   Objective: Vital signs in last 24 hours: Temp:  [97.4 F (36.3 C)-100.8 F (38.2 C)] 99.8 F (37.7 C) (09/11 0400) Pulse Rate:  [94-144] 115 (09/11 0719) Resp:  [14-36] 16 (09/11 0719) BP: (132-149)/(66-103) 143/79 (09/11 0719) SpO2:  [97 %-100 %] 100 % (09/11 0719) FiO2 (%):  [40 %] 40 % (09/11 0719) Weight:  [99.1 kg-105.9 kg] 99.1 kg (09/11 0500) Last BM Date: 08/18/17  Intake/Output from previous day: 09/10 0701 - 09/11 0700 In: 5717 [I.V.:3694.7; Blood:1020; IV Piggyback:1002.3] Out: 7080 [Urine:1315; Emesis/NG output:3475; Drains:490; Blood:700] Intake/Output this shift: No intake/output data recorded.  Exam: Intubated and sedated Abdomen soft, ostomy pink, productive Drain serosanguinous   Lab Results:  Recent Labs    09/27/17 1803 09/28/17 0328  WBC 8.6 8.2  HGB 12.8* 12.3*  HCT 39.4 37.1*  PLT 141* 162   BMET Recent Labs    09/27/17 1613 09/28/17 0328  NA 138 138  K 4.2 4.4  CL 104 102  CO2 19* 26  GLUCOSE 113* 110*  BUN 20 15  CREATININE 0.86 0.98  CALCIUM 8.4* 8.1*   PT/INR Recent Labs    09/26/17 0626  LABPROT 15.7*  INR 1.26   ABG Recent Labs    09/27/17 1551 09/28/17 0355  PHART 7.358 7.378  HCO3 24.7 28.0    Studies/Results: Portable Chest Xray  Result Date: 09/28/2017 CLINICAL DATA:  Hypoxia EXAM: PORTABLE CHEST 1 VIEW COMPARISON:  September 27, 2017 FINDINGS: Endotracheal tube tip is 3.7 cm above the carina. Nasogastric tube tip and side port are in stomach. No pneumothorax. There is atelectatic change in the left base. There is subtle airspace opacity in the right base, slightly less than 1 day prior. No new opacity evident. Heart is borderline prominent with pulmonary vascularity normal. No adenopathy. No bone lesions. IMPRESSION: Tube positions as described without evident pneumothorax. Partial clearing focal  infiltrate right base. Atelectatic change left base. No new opacity. Stable cardiac silhouette. Electronically Signed   By: Bretta Bang III M.D.   On: 09/28/2017 07:25   Portable Chest X-ray  Result Date: 09/27/2017 CLINICAL DATA:  27 year old male currently intubated EXAM: PORTABLE CHEST 1 VIEW COMPARISON:  Chest x-ray obtained earlier today FINDINGS: The tip of the endotracheal tube is 4.1 cm above the carina. A nasogastric tube is present. The tip overlies the gastric fundus. Patchy bibasilar airspace opacities more prominent on the right than the left. Overall, inspiratory volumes are improved. No evidence of pneumothorax or large pleural effusion. No acute osseous abnormality. IMPRESSION: 1. Patchy airspace opacities in the right greater than left base which may reflect atelectasis or multifocal pneumonia. 2. Interval intubation. The tip of the endotracheal tube is 4.1 cm above the carina. 3. Improved lung volumes bilaterally following intubation. 4. Well-positioned gastric tube. Electronically Signed   By: Malachy Moan M.D.   On: 09/27/2017 15:40   Dg Abd Acute W/chest  Result Date: 09/27/2017 CLINICAL DATA:  Nausea and vomiting EXAM: DG ABDOMEN ACUTE W/ 1V CHEST COMPARISON:  Chest two-view 09/21/2017.  Abdomen 09/21/2017 FINDINGS: Bibasilar airspace disease unchanged. This may represent atelectasis or pneumonia. No effusion. Decreased lung volume. NG tube in the stomach. Very large amount of stool throughout the colon similar to 09/22/2014. Large amount of stool in the rectum. No bowel obstruction or free air. The colon is distended and filled with stool diffusely. IMPRESSION: Hypoventilation.  Bibasilar atelectasis/infiltrate unchanged Extremely  large amount of stool throughout the colon. Colon diffusely distended similar to prior studies. Electronically Signed   By: Marlan Palau M.D.   On: 09/27/2017 07:34   Dg Or Local Abdomen  Result Date: 09/27/2017 CLINICAL DATA:  Concern for  retained surgical instrument EXAM: OR LOCAL ABDOMEN COMPARISON:  None. FINDINGS: Surgical drain present with tip in right lower quadrant. Skin staples present. No retained surgical instrument evident. There is an apparent right lower quadrant ostomy. There is no bowel dilatation or air-fluid level. There is free air consistent with surgery. IMPRESSION: No retained surgical instrument. Skin staples and surgical drain present. There is pneumoperitoneum. No bowel obstruction evident. Critical Value/emergent results were called by telephone at the time of interpretation on 09/27/2017 at 1:41 pm to Sherlyn Lick, RN, who verbally acknowledged these results. Electronically Signed   By: Bretta Bang III M.D.   On: 09/27/2017 13:41    Anti-infectives: Anti-infectives (From admission, onward)   Start     Dose/Rate Route Frequency Ordered Stop   09/27/17 1000  piperacillin-tazobactam (ZOSYN) IVPB 3.375 g     3.375 g 12.5 mL/hr over 240 Minutes Intravenous Every 8 hours 09/27/17 0845        Assessment/Plan: s/p Procedure(s): SUBTOTAL COLECTOMY AND ILEOSTOMY (N/A)  H/H stable with restart of anticoag med Continue NG Vent per CCM Continue current plans May need to consider starting TNA   LOS: 6 days    Maybel Dambrosio A 09/28/2017

## 2017-09-28 NOTE — Care Management Note (Signed)
Case Management Note  Patient Details  Name: MATTAN DUBY MRN: [id number] Date of Birth: 1990/09/14  Subjective/Objective:     Pt admitted with bowel obstruction               Action/Plan:  PTA from home independent.  Pt is now s/p surgical intervention and remains intubated.  CM will continue to follow    Expected Discharge Date:                  Expected Discharge Plan:  Home/Self Care  In-House Referral:  Clinical Social Work  Discharge planning Services  CM Consult  Post Acute Care Choice:    Choice offered to:     DME Arranged:    DME Agency:     HH Arranged:    HH Agency:     Status of Service:     If discussed at [employer], dates discussed:    Additional Comments:  Cherylann Parr, RN 09/28/2017, 4:54 PM

## 2017-09-28 NOTE — Consult Note (Signed)
WOC Nurse ostomy consult note Stoma type/location: RLQ, ileostomy  Stomal assessment/size: 1 1/2" round, slightly budded stoma, os at 9 o'clock Peristomal assessment: intact Treatment options for stomal/peristomal skin: using 2" barrier ring around the stoma and an additional 1/2 of a barrier ring on the lateral aspect from 6-12 o'clock  Ostomy pouching: 1pc non formulary pouch used due to lack of supplies from materials Education provided: not applicable at this time. No family in the room. Patient sedated on the vent currently and someone agitated easily.  Enrolled patient in [place] Secure Start DC program: No  WOC Nurse will follow along with you for continued support with ostomy teaching and care Isaid Salvia [facility] MSN, RN, [place], CNS, [place] [phone]

## 2017-09-28 NOTE — Progress Notes (Signed)
Initial Nutrition Assessment  DOCUMENTATION CODES:   Severe malnutrition in context of chronic illness  INTERVENTION:   Agree with initiation of TPN at this time; TPN per pharmacy  Await diet progression post extubation   NUTRITION DIAGNOSIS:   Severe Malnutrition related to chronic illness(cardiomyopathy, substance abuse, chronic constipation/obstipation with megacolon) as evidenced by severe muscle depletion, severe fat depletion, edema.  GOAL:   Patient will meet greater than or equal to 90% of their needs  MONITOR:   Diet advancement, Labs, Weight trends, Skin, I & O's(TPN)  REASON FOR ASSESSMENT:   Ventilator    ASSESSMENT:   27 yo male admitted 9/4 for megacolon and severe obstructive constipation with severely dilated colon and colitis. Noted pt admitted to [facility] at the end of August with constipation x 3 weeks with megacolon with medical therapy attempts, discharged on 9/2. Pt also with chest pain with bilateral PE on admissionPMH includes polysubstance abuse, drug-induced cardiomyopathy, anxiety, chronic obstipation/constipation   9/10 subtotal colectomy and ileostomy , vent post-op  Patient is currently intubated on ventilator support MV: 10.6 L/min Temp (24hrs), Avg:99.4 F (37.4 C), Min:97.8 F (36.6 C), Max:100.8 F (38.2 C)  Propofol: 19.1 ml/hr (504 kcals/24 hours)  Pt alert on vent, able to shake head "yes" and "no" to questions. Pt indicates poor intake and weight loss PTA. Pt indicates this occurred over 1 month time frame. MD note from [facility] admission stating that the majority of pt's intra-abdominal organs were displaced by mass-effect from colon. Pt likely has been experiencing early satiety related to this. Per report, pt had been vomiting feces prior to admission  NG with almost 1 L of dark output today, NG with 1300 mL on night shift with total of 3.5 L yesterday JP drain with 230 mL out today, 500 mL yesterday  Bilateral LE with  significant edema, unsure pt dry weight.  Weight down to 99.1 kg from 105.9 kg yesterday. Admission weight 93.8 kg. Net + 7 L since admission. Plan to utilize weight of 87 kg as EDW. Hopefully able to get better weight history post extubation  Pt acknowledges his height is 5 feet 8 inches  NPO 8 days (since admission) with likely prolonged poor oral intake PTA Discussed nutrition poc with RN and CCM. Plan to place PICC line today and start TPN. Per pharmacy note, unable to start TPN until tomorrow due to late consult.  Nutritional needs below based on current vent status; nutritional needs will need to be adjusted post extubation Once off diprivan, recommend starting lipid infusion. DO NOT recommend withholding lipid infusion in this patient.   Labs: reviewed Meds: LR at 75 ml/hr  NUTRITION - FOCUSED PHYSICAL EXAM:    Most Recent Value  Orbital Region  Severe depletion  Upper Arm Region  Severe depletion  Thoracic and Lumbar Region  Severe depletion  Buccal Region  Severe depletion  Temple Region  Severe depletion  Clavicle Bone Region  Severe depletion  Clavicle and Acromion Bone Region  Severe depletion  Scapular Bone Region  Severe depletion  Dorsal Hand  Unable to assess  Patellar Region  Unable to assess  Anterior Thigh Region  Unable to assess  Posterior Calf Region  Unable to assess  Edema (RD Assessment)  Severe [b/l LE]       Diet Order:   Diet Order            Diet NPO time specified  Diet effective now  EDUCATION NEEDS:   Not appropriate for education at this time  Skin:  Skin Assessment: Skin Integrity Issues: Skin Integrity Issues:: Incisions Incisions: abdomen  Last BM:  08/18/17  Height:   Ht Readings from Last 1 Encounters:  09/27/17 5' 7.99" (1.727 m)    Weight:   Wt Readings from Last 1 Encounters:  09/28/17 99.1 kg    Ideal Body Weight:  70 kg  BMI:  Body mass index is 33.23 kg/m.  Estimated Nutritional Needs:   Kcal:   2244 kcals   Protein:  130-160 g  Fluid:  >/= 2 L   Romelle Starcher MS, RD, LDN, CNSC [phone] Pager  [phone] Weekend/On-Call Pager

## 2017-09-28 NOTE — Progress Notes (Signed)
No further cardiac recommendations at this time. Will be available as needed.  [facility] will sign off.   Medication Recommendations:  Xarelto at discharge Other recommendations (labs, testing, etc):  None Follow up as an outpatient:  Dr. Satira Sark. Sherron Monday [facility]) for cardiology follow-up  Chrystie Nose, MD, [facility], FACP  [place]  [facility]  Medical Director of the [facility]  Card[facility]ovascular [facility] Reduction Clinic Diplomate of the American Board of Clinical Lipidology Attending Cardiologist  Direct Dial: [phone]  Fax: [phone]  Website:  [email or web address]

## 2017-09-29 ENCOUNTER — Inpatient Hospital Stay (HOSPITAL_COMMUNITY): Payer: BLUE CROSS/BLUE SHIELD

## 2017-09-29 LAB — DIFFERENTIAL
BASOS ABS: 0 10*3/uL (ref 0.0–0.1)
Basophils Relative: 1 %
EOS ABS: 0.1 10*3/uL (ref 0.0–0.7)
Eosinophils Relative: 3 %
LYMPHS ABS: 1.1 10*3/uL (ref 0.7–4.0)
Lymphocytes Relative: 24 %
MONO ABS: 1.1 10*3/uL — AB (ref 0.1–1.0)
Monocytes Relative: 26 %
NEUTROS ABS: 2.1 10*3/uL (ref 1.7–7.7)
NEUTROS PCT: 46 %

## 2017-09-29 LAB — COMPREHENSIVE METABOLIC PANEL
ALBUMIN: 1.9 g/dL — AB (ref 3.5–5.0)
ALT: 22 U/L (ref 0–44)
ANION GAP: 5 (ref 5–15)
AST: 21 U/L (ref 15–41)
Alkaline Phosphatase: 50 U/L (ref 38–126)
BILIRUBIN TOTAL: 0.8 mg/dL (ref 0.3–1.2)
BUN: 12 mg/dL (ref 6–20)
CHLORIDE: 104 mmol/L (ref 98–111)
CO2: 28 mmol/L (ref 22–32)
Calcium: 7.6 mg/dL — ABNORMAL LOW (ref 8.9–10.3)
Creatinine, Ser: 0.63 mg/dL (ref 0.61–1.24)
GFR calc Af Amer: >60 mL/min (ref >60)
GFR calc non Af Amer: >60 mL/min (ref >60)
GLUCOSE: 95 mg/dL (ref 70–99)
POTASSIUM: 4.1 mmol/L (ref 3.5–5.1)
SODIUM: 137 mmol/L (ref 135–145)
TOTAL PROTEIN: 3.9 g/dL — AB (ref 6.5–8.1)

## 2017-09-29 LAB — CBC
HEMATOCRIT: 25.6 % — AB (ref 39.0–52.0)
Hemoglobin: 8 g/dL — ABNORMAL LOW (ref 13.0–17.0)
MCH: 27.6 pg (ref 26.0–34.0)
MCHC: 31.3 g/dL (ref 30.0–36.0)
MCV: 88.3 fL (ref 78.0–100.0)
PLATELETS: 101 10*3/uL — AB (ref 150–400)
RBC: 2.9 MIL/uL — ABNORMAL LOW (ref 4.22–5.81)
RDW: 14.9 % (ref 11.5–15.5)
WBC: 4.4 10*3/uL (ref 4.0–10.5)

## 2017-09-29 LAB — HEMOGLOBIN AND HEMATOCRIT, BLOOD
HCT: 25.5 % — ABNORMAL LOW (ref 39.0–52.0)
HCT: 29.1 % — ABNORMAL LOW (ref 39.0–52.0)
Hemoglobin: 8 g/dL — ABNORMAL LOW (ref 13.0–17.0)
Hemoglobin: 9.4 g/dL — ABNORMAL LOW (ref 13.0–17.0)

## 2017-09-29 LAB — MAGNESIUM: Magnesium: 1.9 mg/dL (ref 1.7–2.4)

## 2017-09-29 LAB — TRIGLYCERIDES: TRIGLYCERIDES: 156 mg/dL — AB (ref <150)

## 2017-09-29 LAB — GLUCOSE, CAPILLARY: Glucose-Capillary: 89 mg/dL (ref 70–99)

## 2017-09-29 LAB — HEPARIN LEVEL (UNFRACTIONATED): HEPARIN UNFRACTIONATED: 0.35 [IU]/mL (ref 0.30–0.70)

## 2017-09-29 LAB — PATHOLOGIST SMEAR REVIEW

## 2017-09-29 LAB — PREPARE RBC (CROSSMATCH)

## 2017-09-29 LAB — PREALBUMIN: Prealbumin: <5 mg/dL — ABNORMAL LOW (ref 18–38)

## 2017-09-29 LAB — PHOSPHORUS: PHOSPHORUS: 2.3 mg/dL — AB (ref 2.5–4.6)

## 2017-09-29 MED ORDER — SODIUM PHOSPHATES 45 MMOLE/15ML IV SOLN
10.0000 mmol | Freq: Once | INTRAVENOUS | Status: AC
  Administered 2017-09-29: 10 mmol via INTRAVENOUS
  Filled 2017-09-29: qty 3.33

## 2017-09-29 MED ORDER — POTASSIUM PHOSPHATES 15 MMOLE/5ML IV SOLN: 30.0000 mmol | Freq: Once | INTRAVENOUS | Status: DC

## 2017-09-29 MED ORDER — SODIUM CHLORIDE 0.9% IV SOLUTION
Freq: Once | INTRAVENOUS | Status: AC
  Administered 2017-09-29: 10:00:00 via INTRAVENOUS

## 2017-09-29 MED ORDER — ORAL CARE MOUTH RINSE: 15.0000 mL | Freq: Two times a day (BID) | OROMUCOSAL | Status: DC

## 2017-09-29 MED ORDER — TRAVASOL 10 % IV SOLN
INTRAVENOUS | Status: AC
  Administered 2017-09-29: 16:00:00 via INTRAVENOUS
  Filled 2017-09-29: qty 691.2

## 2017-09-29 MED ORDER — LACTATED RINGERS IV SOLN: INTRAVENOUS | Status: DC

## 2017-09-29 MED ORDER — FENTANYL CITRATE (PF) 100 MCG/2ML IJ SOLN
25.0000 ug | INTRAMUSCULAR | Status: DC | PRN
  Administered 2017-09-29 – 2017-09-30 (×8): 100 ug via INTRAVENOUS
  Filled 2017-09-29 (×8): qty 2

## 2017-09-29 MED ORDER — LORAZEPAM 2 MG/ML IJ SOLN
0.5000 mg | INTRAMUSCULAR | Status: AC | PRN
  Administered 2017-09-29: 1 mg via INTRAVENOUS
  Filled 2017-09-29: qty 1

## 2017-09-29 MED ORDER — HALOPERIDOL LACTATE 5 MG/ML IJ SOLN
1.0000 mg | INTRAMUSCULAR | Status: DC | PRN
  Administered 2017-10-03 (×3): 4 mg via INTRAVENOUS
  Filled 2017-09-29 (×4): qty 1

## 2017-09-29 NOTE — Progress Notes (Addendum)
PHARMACY - ADULT TOTAL PARENTERAL NUTRITION CONSULT NOTE   Pharmacy Consult for TPN Indication: massive bowel resection  Patient Measurements: Height: 5' 7.99" (172.7 cm) Weight: 205 lb 7.5 oz (93.2 kg) IBW/kg (Calculated) : 68.38 TPN AdjBW (KG): 77.8 Body mass index is 31.25 kg/m. Usual Weight: 87 kg  Assessment:  [name] admitted on 9/4 with complete colonic inertia, megacolon and severe obstructive constipation. Pt indicates poor intake and weight loss PTA over 1 month time frame. 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 Was found to have a massively distended colon from the cecum all the way to distal rectum and pelvic rim.  No evidence of perforation. Pharmacy has been consulted to start TPN for severe malnutrition related to chronic illness (cardiomyopathy, substance abuse, chronic constipation/obstruction with megacolon) associated with severe muscle and fat depletion and edema.  GI: Ogilvie's syndrome - s/p colectomy and ileostomy 9/10. NPO 8 days (since admission) with poor oral intake PTA. JP drain output 1.2 L over last 24 h. Endo: hypothyroidism (TSH 6.6 and T4 0.62 on 9/1) - continue levothyroxine. Glucose range 89-125 mg/dL. Insulin requirements in the past 24 hours: none Lytes: lytes ok, corrected Ca 9.3, phos 2.3 Renal: SCr improving 1.1>0.98>0.63, BUN improving 20>15>12. UOP 1.3 L yesterday.  Pulm: procedural intubation 9/10 -  now on PSV/CPAP, PEEP 5, FiO2 40% - propofol (25.4 mL) and fentanyl for sedation Cards: bilateral PE noted on admission likely provoked by lower extremity DVT in the setting of pelvic vein compression - on heparin drip (currently on hold for increased JP drain output). CT did not show evidence of RV strain. LVEF 55-60%. Hepatobil: LFTs wnl, tbili wnl, TG 156 (on propofol) Neuro: history of MDD, substance abuse disorder, ADD - Medications PTA: buspirone, citalopram, divalproex, gabapentin (agitation), quetiapine,  and trazodone ID: [id number] empiric zosyn to cover for possible aspiration, WBC wnl, afebrile  TPN Access: PICC triple lumen placed 9/11 TPN start date: 9/12 Nutritional Goals (per RD recommendation on 9/11): KCal: 2244 kcal Protein: 130-160 g Fluid: > 2 L  Goal TPN rate is 80 ml/hr  Current Nutrition: none  Plan:  Initiate TPN at 40 mL/hr. This TPN provides 69 g of protein and 115 g of dextrose TPN will not contain any lipids as patient is currently receiving propofol at 25 mL/hr, providing 660 kcal/day  Per RD: Nutritional needs based on current vent status; nutritional needs will need to be adjusted post extubation. Once off propofol, recommend starting lipid infusion. DO NOT recommend withholding lipid infusion in this patient.  TPN and propofol provide 1328 kCals per day, meeting 59% of patient needs Electrolytes in TPN: standard Add MVI, trace elements, 0 units of regular insulin to TPN LR currently running at 75 mL/hr. Will decrease to 35 mL/hr at 1800 with the start of TPN. Monitor TPN labs. Patient is at risk for refeeding.  Danae Orleans, PharmD PGY1 Pharmacy Resident Phone [phone] 09/29/2017       8:52 AM    Addendum: patient has hypophosphatemia of 2.3 - CCM wants to replace phosphorus outside of TPN. Sodium phosphate 10 mmol ordered x 1

## 2017-09-29 NOTE — Progress Notes (Signed)
[facility]/Trauma Progress Note  2 Days Post-Op   Assessment/Plan Principal Problem: Ogilvie's syndrome Active Problems: MDD (major depressive disorder), recurrent severe, without psychosis (HCC) Leg edema Fecal impaction (HCC) Acute respiratory failure with hypoxia (HCC) Pulmonary embolism, bilateral (HCC) Hypothyroidism Colon distention  Megacolon with rectal fecal impaction -Colonic inertia versusshort segmentHirschsprung's diseasevspatient has a long-standing history of chronic narcotic abuse and chronic substance abuse.  - S/P total abdominal colectomy, ileostomy, Dr. Magnus Ivan, 09/10 - high NGT output, continue NGT and TPN - await return of bowel function Acuteb/l PE  - per medicine, holding heparin due to bloody high output JP  Emesis and possible aspiration - empiric Zosyn to cover for possible aspiration - on the vent, extubation per CCM   FEN:NPO, NGT, TPN VTE: SCD's,holding heparin   NO:MVEHM 09/10>> Foley:yes Follow up:TBD  Plan: holding heparin in setting of bloody high JP drain output, H&H pending, pt may need blood, will monitor closely, if bleeding continues will order CT. Extubation plans per CCM, continue TPN   LOS: 7 days    Subjective: CC: S/P total abdominal colectomy   Pt is agitated and restless. Sedation has been decreased. Pt still intubated. Mom at bedside. Nurse states high bloody JP drain output overnight.   Objective: Vital signs in last 24 hours: Temp:  [98.7 F (37.1 C)-99.6 F (37.6 C)] 98.8 F (37.1 C) (09/11 2348) Pulse Rate:  [99-155] 102 (09/12 0722) Resp:  [9-29] 14 (09/12 0722) BP: (100-139)/(56-99) 119/70 (09/12 0722) SpO2:  [97 %-100 %] 99 % (09/12 0722) FiO2 (%):  [40 %] 40 % (09/12 0722) Weight:  [93.2 kg] 93.2 kg (09/12 0500) Last BM Date: 08/18/17  Intake/Output from previous day: 09/11 0701 - 09/12 0700 In: 3468.4 [I.V.:3268.5; IV Piggyback:200] Out: 4470  [Urine:1375; Emesis/NG output:1900; Drains:1170; Stool:25] Intake/Output this shift: No intake/output data recorded.  PE: Gen:  Alert, agitated, restless Card:  tachycardic, no M/G/R heard Pulm:  Intubated and trying to breath over the vent Abd: Soft, distended, no BS appreciated, midline incision with honeycomb in place and no bleeding noted, JP drain with copious sanguinous drainage, ileostomy with small amt of light brown liquid stool in bag Extremities: BLE edema Skin: no rashes noted, warm and dry   Anti-infectives: Anti-infectives (From admission, onward)   Start     Dose/Rate Route Frequency Ordered Stop   09/27/17 1000  piperacillin-tazobactam (ZOSYN) IVPB 3.375 g     3.375 g 12.5 mL/hr over 240 Minutes Intravenous Every 8 hours 09/27/17 0845        Lab Results:  Recent Labs    09/28/17 1739 09/29/17 0520  WBC 4.4 4.4  HGB 9.4* 8.0*  HCT 28.9* 25.6*  PLT 103* 101*   BMET Recent Labs    09/28/17 0328 09/29/17 0520  NA 138 137  K 4.4 4.1  CL 102 104  CO2 26 28  GLUCOSE 110* 95  BUN 15 12  CREATININE 0.98 0.63  CALCIUM 8.1* 7.6*   PT/INR No results for input(s): LABPROT, INR in the last 72 hours. CMP     Component Value Date/Time   NA 137 09/29/2017 0520   K 4.1 09/29/2017 0520   CL 104 09/29/2017 0520   CO2 28 09/29/2017 0520   GLUCOSE 95 09/29/2017 0520   BUN 12 09/29/2017 0520   CREATININE 0.63 09/29/2017 0520   CALCIUM 7.6 (L) 09/29/2017 0520   PROT 3.9 (L) 09/29/2017 0520   ALBUMIN 1.9 (L) 09/29/2017 0520   AST 21 09/29/2017 0520   ALT  22 09/29/2017 0520   ALKPHOS 50 09/29/2017 0520   BILITOT 0.8 09/29/2017 0520   GFRNONAA >60 09/29/2017 0520   GFRAA >60 09/29/2017 0520   Lipase     Component Value Date/Time   LIPASE 37 09/21/2017 2237    Studies/Results: Portable Chest Xray  Result Date: 09/28/2017 CLINICAL DATA:  Hypoxia EXAM: PORTABLE CHEST 1 VIEW COMPARISON:  September 27, 2017 FINDINGS: Endotracheal tube tip is 3.7 cm above  the carina. Nasogastric tube tip and side port are in stomach. No pneumothorax. There is atelectatic change in the left base. There is subtle airspace opacity in the right base, slightly less than 1 day prior. No new opacity evident. Heart is borderline prominent with pulmonary vascularity normal. No adenopathy. No bone lesions. IMPRESSION: Tube positions as described without evident pneumothorax. Partial clearing focal infiltrate right base. Atelectatic change left base. No new opacity. Stable cardiac silhouette. Electronically Signed   By: Bretta Bang III M.D.   On: 09/28/2017 07:25   Portable Chest X-ray  Result Date: 09/27/2017 CLINICAL DATA:  27 year old male currently intubated EXAM: PORTABLE CHEST 1 VIEW COMPARISON:  Chest x-ray obtained earlier today FINDINGS: The tip of the endotracheal tube is 4.1 cm above the carina. A nasogastric tube is present. The tip overlies the gastric fundus. Patchy bibasilar airspace opacities more prominent on the right than the left. Overall, inspiratory volumes are improved. No evidence of pneumothorax or large pleural effusion. No acute osseous abnormality. IMPRESSION: 1. Patchy airspace opacities in the right greater than left base which may reflect atelectasis or multifocal pneumonia. 2. Interval intubation. The tip of the endotracheal tube is 4.1 cm above the carina. 3. Improved lung volumes bilaterally following intubation. 4. Well-positioned gastric tube. Electronically Signed   By: Malachy Moan M.D.   On: 09/27/2017 15:40   [place] Ekg Site Rite  Result Date: 09/28/2017 If Site Rite image not attached, placement could not be confirmed due to current cardiac rhythm.  Dg Or Local Abdomen  Result Date: 09/27/2017 CLINICAL DATA:  Concern for retained surgical instrument EXAM: OR LOCAL ABDOMEN COMPARISON:  None. FINDINGS: Surgical drain present with tip in right lower quadrant. Skin staples present. No retained surgical instrument evident. There is an  apparent right lower quadrant ostomy. There is no bowel dilatation or air-fluid level. There is free air consistent with surgery. IMPRESSION: No retained surgical instrument. Skin staples and surgical drain present. There is pneumoperitoneum. No bowel obstruction evident. Critical Value/emergent results were called by telephone at the time of interpretation on 09/27/2017 at 1:41 pm to Sherlyn Lick, RN, who verbally acknowledged these results. Electronically Signed   By: Bretta Bang III M.D.   On: 09/27/2017 13:41      Jerre Simon , [facility] 09/29/2017, 7:53 AM  Pager: [phone] Mon-Wed, Friday 7:00am-4:30pm Thurs 7am-11:30am  Consults: [phone]

## 2017-09-29 NOTE — Progress Notes (Signed)
Philip Thompson  GYK:[id number] DOB: 1990-05-09 DOA: 09/21/2017 PCP: [name], MD    LOS: 7 days   Reason for Consult / Chief Complaint:  Pre-op acute hypoxic respiratory failure and Post operative critical care services   Consulting MD and date:  [name]  HPI/Summary of hospital stay:  27 year old male patient who was admitted on 9/4 with complete colonic inertia, megacolon and resultant severe obstructive constipation, further complicated by bilateral pulmonary emboli thought to be consequence of prior DVT secondary to pelvic vein obstruction.  Was admitted to the MedSurg unit with plans to proceed with subtotal colectomy following days of anticoagulation. 9/10: Vomited 800 mL of foul feculent appearing emesis.  Following this event saturations dropped to 84 to 86% on nasal cannula.  He vomited once again before and during NG tube placement with saturations as low as 70% requiring rapid escalation of supplemental oxygen.  Critical care was called to assess at bedside for acute hypoxic respiratory failure felt secondary to aspiration event.  On evaluation he was awake, tachycardic but saturating in the mid 90s on nonrebreather NG tube was intact.  We advised the best course of action was to proceed with surgery.  He returned to the intensive care postoperatively Orally intubated status post subtotal colectomy and ileostomy (see procedure section below). Critical care asked to assume care.    Subjective:  Weaning on PSV 5/5 with great volumes.  Very anxious to get extubated. Vitals stable.  Objective   Blood pressure (!) 151/86, pulse (!) 133, temperature 98.2 F (36.8 C), temperature source Oral, resp. rate 20, height 5' 7.99" (1.727 m), weight 93.2 kg, SpO2 91 %.    Vent Mode: CPAP;PSV FiO2 (%):  [40 %] 40 % Set Rate:  [14 bmp] 14 bmp Vt Set:  [500 mL-530 mL] 530 mL PEEP:  [5 cmH20] 5 cmH20 Pressure Support:  [5 cmH20-10 cmH20] 5 cmH20 Plateau Pressure:  [16 cmH20-18 cmH20]  16 cmH20   Intake/Output Summary (Last 24 hours) at 09/29/2017 1047 Last data filed at 09/29/2017 1006 Gross per 24 hour  Intake 3277.61 ml  Output 4700 ml  Net -1422.39 ml   Filed Weights   09/27/17 0939 09/28/17 0500 09/29/17 0500  Weight: 105.9 kg 99.1 kg 93.2 kg    Examination: General:  Young adult male lying in bed, anxious / agitated. HEENT: MM pink/moist, ETT in place. Neuro: Awake, anxious and agitated. CV: tachy, regular, no M/R/G. PULM: even/non-labored, CTAB. GI: Dressings C/D/I. Soft, non-tender, bsx4 active. Extremities: warm to touch / dry, 2+ BLE pitting edema. Skin: right lateral thigh with purple discoloration, non-blanchable (bruise).  Consults: date of consult/date signed off & final recs:  General surgery 9/6 GI 9/6 Critical care 9/10   Procedures: 9/10 - Subtotal colectomy and ileostomy.  Was found to have a massively distended colon from the cecum all the way to distal rectum and pelvic rim.  No evidence of perforation. Media Information    Oral endotracheal tube 9/10 >>  Significant Diagnostic Tests: 9/5 CT chest, abdomen and pelvis: Bilateral pulmonary emboli without evidence of right heart strain.  Diffuse emphysematous changes in the lungs.  Patchy airspace disease.  Massively distended colon throughout.  Was filled with both gas and mostly stool.  There is a large amount of stool in the rectum with rectal wall thickening  Micro Data: Sputum culture 9/10 >>  Antimicrobials:  Zosyn (possible aspiration) 9/10 >>  Resolved Hospital Problem list   Colonic inertia and megacolon s/p subtotal  colectomy  Assessment & Plan:   S/p subtotal colectomy w/ end ileostomy for colonic inertia and megacolon 9/10 P: Post op surgical care per CCS Bowel rest for now Timing of tube feeds per surgery TNA per surgery / pharmacy WOC following for ileostomy  Acute hypoxic respiratory failure following aspiration pneumonia superimposed on underlying pulmonary  emboli and decreased abdominal compliance P: Extubate now Bronchial hygiene Aspiration coverage with zosyn   Bilateral Pulmonary Emboli.  Likely provoked by lower extremity DVT in the setting of pelvic vein compression -Most recent lower extremity ultrasounds are negative for DVT -5 days unfractionated heparin.  Preoperative CT did not show evidence of RV strain P: Continue IV heparin per pharmacy - holding for now given bloody output from JP Monitor for bleeding  Trend CBC  Hypotension -favor primarily drug-related as well as post anesthesia related, resolved P: D/c sedation LR at 36ml/hr   Hypomagnesemia - resolved.   Hypophosphatemia. At risk for significant intravascular volume shifts. P: 30 mmol K phos Follow BMP Replace electrolytes as indicated   Anemia of chronic disease - Received 2 units of blood and FFP intraoperatively.  Preoperative hemoglobin 13.5 P: Getting 2u PRBC and FFP per surgery Monitor on heparin gtt - on hold today 9/12  Severe protein calorie malnutrition P: Continue TNA per surgery / pharmacy Tube feed timing per surgery  Hypothyroidism P: Continue synthroid   Massive lower extremity edema.  Felt secondary to pelvic vein obstruction due to massive abdomen. -Would expect this to improve some now that abdomen is decompressed -No DVT appreciated P: Continue to monitor  History of attention deficit disorder, anxiety, depression, and polysubstance abuse.  Also chronic back pain P: Hold home depakote, quetiapine, trazodone, buspar, gabapentin    Disposition / Summary of Today's Plan 09/29/17   Extubate now. Continue TNA per pharm / surgery and transition to tube feeds when OK with surgery. Heparin gtt on hold due to bloody output from JP drain.   Best Practice / Goals of Care / Disposition.   DVT prophylaxis: SCD; IV heparin NO BOLUS 2130 9/10 (on hold 9/12) GI prophylaxis: H2B Diet: NPO Mobility: BR, advance as able  Code Status: full  code  Family Communication: Mom updated 9/12.  Labs   CBC: Recent Labs  Lab 09/27/17 1613 09/27/17 1803 09/28/17 0328 09/28/17 1739 09/29/17 0520 09/29/17 0853  WBC 5.8 8.6 8.2 4.4 4.4  --   NEUTROABS  --   --   --   --  2.1  --   HGB 13.1 12.8* 12.3* 9.4* 8.0* 8.0*  HCT 39.5 39.4 37.1* 28.9* 25.6* 25.5*  MCV 84.9 86.4 85.5 88.7 88.3  --   PLT 129* 141* 162 103* 101*  --    Basic Metabolic Panel: Recent Labs  Lab 09/24/17 1135  09/26/17 0626 09/27/17 0423 09/27/17 1226 09/27/17 1613 09/28/17 0328 09/29/17 0520  NA 138   < > 139 137 138 138 138 137  K 3.5   < > 3.6 4.1 4.5 4.2 4.4 4.1  CL 104   < > 106 103  --  104 102 104  CO2 26   < > 23 22  --  19* 26 28  GLUCOSE 102*   < > 108* 125* 107* 113* 110* 95  BUN 11   < > 11 17  --  20 15 12   CREATININE 0.81   < > 0.74 1.12  --  0.86 0.98 0.63  CALCIUM 9.2   < > 8.9 9.2  --  8.4* 8.1* 7.6*  MG 1.7  --  1.7 1.6*  --   --  1.9 1.9  PHOS 3.7  --  4.3 4.7*  --   --  3.6 2.3*   < > = values in this interval not displayed.   GFR: Estimated Creatinine Clearance: 153.6 mL/min (by C-G formula based on SCr of 0.63 mg/dL). Recent Labs  Lab 09/27/17 1803 09/28/17 0328 09/28/17 1739 09/29/17 0520  WBC 8.6 8.2 4.4 4.4   Liver Function Tests: Recent Labs  Lab 09/24/17 0016 09/28/17 0328 09/29/17 0520  AST 15 29 21   ALT 13 25 22   ALKPHOS 74 58 50  BILITOT 0.6 0.8 0.8  PROT 6.5 5.0* 3.9*  ALBUMIN 3.6 2.6* 1.9*   No results for input(s): LIPASE, AMYLASE in the last 168 hours. No results for input(s): AMMONIA in the last 168 hours.   ABG    Component Value Date/Time   PHART 7.378 09/28/2017 0355   PCO2ART 47.8 09/28/2017 0355   PO2ART 165.0 (H) 09/28/2017 0355   HCO3 28.0 09/28/2017 0355   TCO2 29 09/28/2017 0355   ACIDBASEDEF 1.0 09/27/2017 1551   O2SAT 99.0 09/28/2017 0355    Coagulation Profile: Recent Labs  Lab 09/26/17 0626  INR 1.26   Cardiac Enzymes: Recent Labs  Lab 09/24/17 0016  TROPONINI  <0.03   HbA1C: Hgb A1c MFr Bld  Date/Time Value Ref Range Status  03/27/2017 06:54 AM 5.4 4.8 - 5.6 % Final    Comment:    (NOTE) Pre diabetes:          5.7%-6.4% Diabetes:              >6.4% Glycemic control for   <7.0% adults with diabetes   07/02/2016 07:34 AM 5.4 4.8 - 5.6 % Final    Comment:    (NOTE)         Pre-diabetes: 5.7 - 6.4         Diabetes: >6.4         Glycemic control for adults with diabetes: <7.0    CBG: Recent Labs  Lab 09/29/17 0757  GLUCAP 89    CC time: 30 min.   Rutherford Guys, [place] [facility] [place] [facility] Pager: [phone]  or [phone] 09/29/2017, 11:01 AM

## 2017-09-29 NOTE — Progress Notes (Signed)
Patient has put out 270 ml from JP drain in 25 minutes, dressing saturated. Dr. Sung Amabile made aware and gave a verbal order to hold the heparin. Dr. Cliffton Asters with general surgery also notified but not further orders given from him. Will continue to closely monitor.

## 2017-09-29 NOTE — Progress Notes (Addendum)
ANTICOAGULATION CONSULT NOTE - Follow Up Consult  Pharmacy Consult for Heparin Indication: pulmonary embolus  No Known Allergies  Patient Measurements: Height: 5' 7.99" (172.7 cm) Weight: 205 lb 7.5 oz (93.2 kg) IBW/kg (Calculated) : 68.38 Heparin Dosing Weight: 91.6 kg  Vital Signs: Temp: 98.2 F (36.8 C) (09/12 1006) Temp Source: Oral (09/12 1006) BP: 133/99 (09/12 1130) Pulse Rate: 116 (09/12 1200)  Labs: Recent Labs    09/27/17 1613  09/28/17 0328 09/28/17 0957 09/28/17 1739 09/29/17 0520 09/29/17 0853  HGB 13.1   < > 12.3*  --  9.4* 8.0* 8.0*  HCT 39.5   < > 37.1*  --  28.9* 25.6* 25.5*  PLT 129*   < > 162  --  103* 101*  --   HEPARINUNFRC  --   --  0.27* 0.35  --  0.35  --   CREATININE 0.86  --  0.98  --   --  0.63  --    < > = values in this interval not displayed.    Estimated Creatinine Clearance: 153.6 mL/min (by C-G formula based on SCr of 0.63 mg/dL).   Assessment: [name] w/ acute pulmonary embolism likely provoked by lower extremity DVT in the setting of pelvic vein compression.  Heparin has been turned off as of 9/12 ~ 0600 due to increased output from JP drain. Surgery and CCM aware and have agreed to hold heparin for today.  Goal of Therapy:  Heparin level 0.3-0.7 units/ml Monitor platelets by anticoagulation protocol: Yes   Plan:  Hold heparin infusion Follow up resuming heparin when patient able Continue to monitor for post-operative bleeding and JP drain output Plan for Xarelto at discharge per cardiology  Danae Orleans, PharmD PGY1 Pharmacy Resident Phone [phone] 09/29/2017       12:15 PM

## 2017-09-29 NOTE — Progress Notes (Signed)
eLink Physician-Brief Progress Note Patient Name: Philip Thompson DOB: [birth year] MRN: [id number]   Date of Service  09/29/2017  HPI/Events of Note  Hypoxemia recent large emesis  eICU Interventions  cxr to evaluate for aspiration FIO2 increased with improvement     Intervention Category Major Interventions: Hypoxemia - evaluation and management  Henry Russel, P 09/29/2017, 10:20 PM

## 2017-09-29 NOTE — Procedures (Signed)
Extubation Procedure Note  Patient Details:   Name: Philip Thompson DOB: 11-Apr-1990 MRN: [id number]   Airway Documentation:    Vent end date: 09/29/17 Vent end time: 1137   Evaluation  O2 sats: stable throughout Complications: No apparent complications Patient did tolerate procedure well. Bilateral Breath Sounds: Clear, Diminished   Yes  PT was extubated to 3L [place]  PT was able to talk and clear secretions  Sats are stable and RT to monitor     Taiz Bickle, Duane Lope 09/29/2017, 11:38 AM

## 2017-09-29 NOTE — Progress Notes (Signed)
While checking on patient due to him being anxious, noticed he was diaphoretic, pale and nauseas. Pt vomited a significant amount, zofran and ativan given, NG tube flushed and immediately put out . SATs dropped to 85%, nonrebreather. [facility] and Surgery both notified. Chest x-ray ordered. Will continue to closely monitor.

## 2017-09-29 NOTE — Anesthesia Postprocedure Evaluation (Signed)
Anesthesia Post Note  Patient: Philip Thompson  Procedure(s) Performed: SUBTOTAL COLECTOMY AND ILEOSTOMY (N/A Abdomen)     Patient location during evaluation: SICU Anesthesia Type: General Level of consciousness: sedated Pain management: pain level controlled Vital Signs Assessment: post-procedure vital signs reviewed and stable Respiratory status: patient remains intubated per anesthesia plan Cardiovascular status: stable Postop Assessment: no apparent nausea or vomiting Anesthetic complications: no    Last Vitals:  Vitals:   09/29/17 1230 09/29/17 1309  BP: 138/84 139/87  Pulse: (!) 110 (!) 28  Resp: 15 (!) 25  Temp: 37.2 C 36.9 C  SpO2: 100% 93%    Last Pain:  Vitals:   09/29/17 1309  TempSrc: Oral  PainSc:                  Philip Thompson

## 2017-09-30 ENCOUNTER — Inpatient Hospital Stay (HOSPITAL_COMMUNITY): Payer: BLUE CROSS/BLUE SHIELD

## 2017-09-30 LAB — CBC
HCT: 31.8 % — ABNORMAL LOW (ref 39.0–52.0)
Hemoglobin: 10.6 g/dL — ABNORMAL LOW (ref 13.0–17.0)
MCH: 28.6 pg (ref 26.0–34.0)
MCHC: 33.3 g/dL (ref 30.0–36.0)
MCV: 85.7 fL (ref 78.0–100.0)
PLATELETS: 137 10*3/uL — AB (ref 150–400)
RBC: 3.71 MIL/uL — AB (ref 4.22–5.81)
RDW: 14.1 % (ref 11.5–15.5)
WBC: 8.8 10*3/uL (ref 4.0–10.5)

## 2017-09-30 LAB — TYPE AND SCREEN
ABO/RH(D): B POS
Antibody Screen: NEGATIVE
UNIT DIVISION: 0
UNIT DIVISION: 0
Unit division: 0
Unit division: 0

## 2017-09-30 LAB — BPAM FFP
BLOOD PRODUCT EXPIRATION DATE: 201909172359
BLOOD PRODUCT EXPIRATION DATE: 201909172359
ISSUE DATE / TIME: 201909121545
ISSUE DATE / TIME: 201909121942
UNIT TYPE AND RH: 7300
Unit Type and Rh: 7300

## 2017-09-30 LAB — POCT I-STAT 3, ART BLOOD GAS (G3+)
Acid-Base Excess: 3 mmol/L — ABNORMAL HIGH (ref 0.0–2.0)
BICARBONATE: 26.3 mmol/L (ref 20.0–28.0)
O2 SAT: 92 %
PCO2 ART: 36.8 mmHg (ref 32.0–48.0)
Patient temperature: 99.1
TCO2: 27 mmol/L (ref 22–32)
pH, Arterial: 7.464 — ABNORMAL HIGH (ref 7.350–7.450)
pO2, Arterial: 60 mmHg — ABNORMAL LOW (ref 83.0–108.0)

## 2017-09-30 LAB — BASIC METABOLIC PANEL
Anion gap: 9 (ref 5–15)
BUN: 5 mg/dL — AB (ref 6–20)
CALCIUM: 8.3 mg/dL — AB (ref 8.9–10.3)
CO2: 29 mmol/L (ref 22–32)
CREATININE: 0.53 mg/dL — AB (ref 0.61–1.24)
Chloride: 99 mmol/L (ref 98–111)
GFR calc Af Amer: >60 mL/min (ref >60)
GFR calc non Af Amer: >60 mL/min (ref >60)
Glucose, Bld: 122 mg/dL — ABNORMAL HIGH (ref 70–99)
Potassium: 3.2 mmol/L — ABNORMAL LOW (ref 3.5–5.1)
SODIUM: 137 mmol/L (ref 135–145)

## 2017-09-30 LAB — BPAM RBC
BLOOD PRODUCT EXPIRATION DATE: 201910032359
BLOOD PRODUCT EXPIRATION DATE: 201910032359
Blood Product Expiration Date: 201909192359
Blood Product Expiration Date: 201910042359
ISSUE DATE / TIME: 201909101133
ISSUE DATE / TIME: 201909121242
ISSUE DATE / TIME: 201909101133
ISSUE DATE / TIME: 201909120928
UNIT TYPE AND RH: 7300
UNIT TYPE AND RH: 7300
UNIT TYPE AND RH: 7300
Unit Type and Rh: 7300

## 2017-09-30 LAB — PREPARE FRESH FROZEN PLASMA
UNIT DIVISION: 0
Unit division: 0

## 2017-09-30 LAB — PHOSPHORUS: Phosphorus: 2.4 mg/dL — ABNORMAL LOW (ref 2.5–4.6)

## 2017-09-30 LAB — GLUCOSE, CAPILLARY
GLUCOSE-CAPILLARY: 114 mg/dL — AB (ref 70–99)
Glucose-Capillary: 131 mg/dL — ABNORMAL HIGH (ref 70–99)

## 2017-09-30 LAB — MAGNESIUM: MAGNESIUM: 1.9 mg/dL (ref 1.7–2.4)

## 2017-09-30 MED ORDER — CHLORHEXIDINE GLUCONATE 0.12% ORAL RINSE (MEDLINE KIT): 15.0000 mL | Freq: Two times a day (BID) | OROMUCOSAL | Status: DC

## 2017-09-30 MED ORDER — LORAZEPAM BOLUS VIA INFUSION: 1.0000 mg | Freq: Once | INTRAVENOUS | Status: DC

## 2017-09-30 MED ORDER — MIDAZOLAM BOLUS VIA INFUSION
1.0000 mg | INTRAVENOUS | Status: DC | PRN
  Administered 2017-09-30: 2 mg via INTRAVENOUS
  Administered 2017-09-30: 1 mg via INTRAVENOUS
  Administered 2017-10-01: 2 mg via INTRAVENOUS
  Administered 2017-10-01: 1 mg via INTRAVENOUS
  Administered 2017-10-02: 2 mg via INTRAVENOUS
  Administered 2017-10-02: 1 mg via INTRAVENOUS
  Administered 2017-10-03 – 2017-10-07 (×12): 2 mg via INTRAVENOUS
  Filled 2017-09-30: qty 2

## 2017-09-30 MED ORDER — ETOMIDATE 2 MG/ML IV SOLN
8.0000 mg | Freq: Once | INTRAVENOUS | Status: AC
  Administered 2017-09-30: 20 mg via INTRAVENOUS

## 2017-09-30 MED ORDER — INSULIN ASPART 100 UNIT/ML ~~LOC~~ SOLN
0.0000 [IU] | Freq: Four times a day (QID) | SUBCUTANEOUS | Status: DC
  Administered 2017-09-30 – 2017-10-14 (×8): 1 [IU] via SUBCUTANEOUS

## 2017-09-30 MED ORDER — LORAZEPAM 2 MG/ML IJ SOLN
1.0000 mg | Freq: Once | INTRAMUSCULAR | Status: AC
  Administered 2017-09-30: 1 mg via INTRAVENOUS

## 2017-09-30 MED ORDER — DEXMEDETOMIDINE HCL IN NACL 400 MCG/100ML IV SOLN
0.0000 ug/kg/h | INTRAVENOUS | Status: AC
  Administered 2017-09-30 (×5): 2 ug/kg/h via INTRAVENOUS
  Administered 2017-09-30: 1 ug/kg/h via INTRAVENOUS
  Administered 2017-10-01 – 2017-10-02 (×11): 2 ug/kg/h via INTRAVENOUS
  Administered 2017-10-02: 0.948 ug/kg/h via INTRAVENOUS
  Administered 2017-10-02 – 2017-10-03 (×10): 2 ug/kg/h via INTRAVENOUS
  Filled 2017-09-30 (×2): qty 200
  Filled 2017-09-30 (×4): qty 100
  Filled 2017-09-30: qty 200
  Filled 2017-09-30: qty 100
  Filled 2017-09-30 (×10): qty 200

## 2017-09-30 MED ORDER — ORAL CARE MOUTH RINSE
15.0000 mL | OROMUCOSAL | Status: DC
  Administered 2017-09-30 – 2017-10-14 (×128): 15 mL via OROMUCOSAL

## 2017-09-30 MED ORDER — VALPROATE SODIUM 500 MG/5ML IV SOLN
500.0000 mg | Freq: Two times a day (BID) | INTRAVENOUS | Status: DC
  Administered 2017-09-30 – 2017-10-03 (×8): 500 mg via INTRAVENOUS
  Filled 2017-09-30 (×9): qty 5

## 2017-09-30 MED ORDER — MIDAZOLAM HCL 2 MG/2ML IJ SOLN
2.0000 mg | INTRAMUSCULAR | Status: AC | PRN
  Administered 2017-09-30 (×3): 2 mg via INTRAVENOUS
  Filled 2017-09-30: qty 2

## 2017-09-30 MED ORDER — POTASSIUM CHLORIDE 10 MEQ/50ML IV SOLN
10.0000 meq | INTRAVENOUS | Status: AC
  Administered 2017-09-30 (×2): 10 meq via INTRAVENOUS
  Filled 2017-09-30 (×2): qty 50

## 2017-09-30 MED ORDER — MIDAZOLAM HCL 2 MG/2ML IJ SOLN
INTRAMUSCULAR | Status: AC
  Administered 2017-09-30: 2 mg
  Filled 2017-09-30: qty 4

## 2017-09-30 MED ORDER — SODIUM CHLORIDE 0.9 % IV SOLN
1.0000 mg/h | INTRAVENOUS | Status: DC
  Administered 2017-09-30: 2 mg/h via INTRAVENOUS
  Administered 2017-10-01 (×2): 8 mg/h via INTRAVENOUS
  Administered 2017-10-01: 10 mg/h via INTRAVENOUS
  Administered 2017-10-01 – 2017-10-02 (×4): 8 mg/h via INTRAVENOUS
  Administered 2017-10-03 (×3): 10 mg/h via INTRAVENOUS
  Administered 2017-10-03: 9.5 mg/h via INTRAVENOUS
  Filled 2017-09-30 (×13): qty 10

## 2017-09-30 MED ORDER — ROCURONIUM BROMIDE 50 MG/5ML IV SOLN
1.0000 mg/kg | Freq: Once | INTRAVENOUS | Status: AC
  Administered 2017-09-30: 84.4 mg via INTRAVENOUS

## 2017-09-30 MED ORDER — POTASSIUM PHOSPHATES 15 MMOLE/5ML IV SOLN
15.0000 mmol | Freq: Once | INTRAVENOUS | Status: AC
  Administered 2017-09-30: 15 mmol via INTRAVENOUS
  Filled 2017-09-30: qty 5

## 2017-09-30 MED ORDER — FENTANYL CITRATE (PF) 100 MCG/2ML IJ SOLN
100.0000 ug | INTRAMUSCULAR | Status: AC | PRN
  Administered 2017-09-30 – 2017-10-03 (×3): 100 ug via INTRAVENOUS
  Filled 2017-09-30 (×5): qty 2

## 2017-09-30 MED ORDER — PROMETHAZINE HCL 25 MG/ML IJ SOLN
12.5000 mg | Freq: Once | INTRAMUSCULAR | Status: AC
  Administered 2017-09-30: 12.5 mg via INTRAVENOUS
  Filled 2017-09-30: qty 1

## 2017-09-30 MED ORDER — FENTANYL CITRATE (PF) 100 MCG/2ML IJ SOLN
100.0000 ug | INTRAMUSCULAR | Status: DC | PRN
  Administered 2017-10-01 – 2017-10-06 (×35): 100 ug via INTRAVENOUS
  Filled 2017-09-30 (×34): qty 2

## 2017-09-30 MED ORDER — ALBUTEROL SULFATE (2.5 MG/3ML) 0.083% IN NEBU
2.5000 mg | INHALATION_SOLUTION | RESPIRATORY_TRACT | Status: DC
  Administered 2017-09-30 – 2017-10-14 (×80): 2.5 mg via RESPIRATORY_TRACT
  Filled 2017-09-30 (×82): qty 3

## 2017-09-30 MED ORDER — LORAZEPAM 2 MG/ML IJ SOLN
1.0000 mg | Freq: Once | INTRAMUSCULAR | Status: AC
  Administered 2017-09-30: 1 mg via INTRAVENOUS
  Filled 2017-09-30: qty 1

## 2017-09-30 MED ORDER — LORAZEPAM 2 MG/ML IJ SOLN
INTRAMUSCULAR | Status: AC
  Filled 2017-09-30: qty 1

## 2017-09-30 MED ORDER — HEPARIN (PORCINE) IN NACL 100-0.45 UNIT/ML-% IJ SOLN: 2050.0000 [IU]/h | INTRAMUSCULAR | Status: DC

## 2017-09-30 MED ORDER — MIDAZOLAM HCL 2 MG/2ML IJ SOLN
2.0000 mg | INTRAMUSCULAR | Status: DC | PRN
  Administered 2017-09-30 (×2): 2 mg via INTRAVENOUS
  Filled 2017-09-30 (×5): qty 2

## 2017-09-30 MED ORDER — TRAVASOL 10 % IV SOLN
INTRAVENOUS | Status: AC
  Administered 2017-09-30: 18:00:00 via INTRAVENOUS
  Filled 2017-09-30: qty 658.8

## 2017-09-30 NOTE — Consult Note (Signed)
WOC Nurse ostomy consult note Stoma type/location: RLQ ileostomy.  Patient anxious, tachypneic.  Emotional support provided.  Explained procedure to patient and mother, at bedside.  Stomal assessment/size: 1 1/2" round pink moist.  Patent and producing scant green effluent.  Peristomal assessment: intact  Midline abdominal incision Treatment options for stomal/peristomal skin: convex pouch Output liquid green effluent Ostomy pouching: 1pc. convex Education provided: Mother at bedside.  Patient opens eyes and responds to questions.  He is anxious.  Emotional support provided.  POuch removed and skin is cleansed.  New pouch applied.  Discussed pouch changes 2-3 times weekly.  Ileostomy and corrosive effluent.  Emptying when 1/3 full.  Enrolled patient in [employer] DC program: No WOC team will follow.  Maple Hudson MSN, RN, FNP-BC CWON Wound, Ostomy, Continence Nurse Pager [phone]

## 2017-09-30 NOTE — Progress Notes (Signed)
[facility] Progress Note  3 Days Post-Op  Subjective: CC:  Mother at bedside. Patient on NRB with rapid respirations. Per RN NG was clogged last night. Patient experienced large volume emesis and aspirated, NG was flushed with immediate 2L output. This AM patient and mother are mostly concerned about his anxiety.   Objective: Vital signs in last 24 hours: Temp:  [98.2 F (36.8 C)-100 F (37.8 C)] 100 F (37.8 C) (09/12 2245) Pulse Rate:  [28-154] 111 (09/13 0600) Resp:  [12-42] 42 (09/13 0600) BP: (131-168)/(67-120) 146/104 (09/13 0600) SpO2:  [86 %-100 %] 90 % (09/13 0730) FiO2 (%):  [40 %-100 %] 100 % (09/13 0730) Weight:  [84.4 kg] 84.4 kg (09/13 0500) Last BM Date: 08/18/17  Intake/Output from previous day: 09/12 0701 - 09/13 0700 In: 3389.7 [I.V.:1385.5; WUGQB:1694; IV Piggyback:610.3] Out: 9545 [Urine:2975; Emesis/NG output:6200; Drains:370] Intake/Output this shift: No intake/output data recorded.  PE: Gen:  Alert, obviously uncomfortable Card:  Sinus tachycardia, no m/r/g  Pulm:  Rapid respirations (55), CTAB, on NRB Abd: Soft, approp tender, quiet abdomen, ileostomy with stool in pouch, JP with small amount sanguinous drainage.   NGT - >7,000cc/24h light brown Skin: warm and dry, no rashes  Psych: A&Ox3   Lab Results:  Recent Labs    09/29/17 0520  09/29/17 1805 09/30/17 0627  WBC 4.4  --   --  8.8  HGB 8.0*   < > 9.4* 10.6*  HCT 25.6*   < > 29.1* 31.8*  PLT 101*  --   --  137*   < > = values in this interval not displayed.   BMET Recent Labs    09/28/17 0328 09/29/17 0520  NA 138 137  K 4.4 4.1  CL 102 104  CO2 26 28  GLUCOSE 110* 95  BUN 15 12  CREATININE 0.98 0.63  CALCIUM 8.1* 7.6*   PT/INR No results for input(s): LABPROT, INR in the last 72 hours. CMP     Component Value Date/Time   NA 137 09/29/2017 0520   K 4.1 09/29/2017 0520   CL 104 09/29/2017 0520   CO2 28 09/29/2017 0520   GLUCOSE 95 09/29/2017 0520   BUN 12  09/29/2017 0520   CREATININE 0.63 09/29/2017 0520   CALCIUM 7.6 (L) 09/29/2017 0520   PROT 3.9 (L) 09/29/2017 0520   ALBUMIN 1.9 (L) 09/29/2017 0520   AST 21 09/29/2017 0520   ALT 22 09/29/2017 0520   ALKPHOS 50 09/29/2017 0520   BILITOT 0.8 09/29/2017 0520   GFRNONAA >60 09/29/2017 0520   GFRAA >60 09/29/2017 0520   Lipase     Component Value Date/Time   LIPASE 37 09/21/2017 2237       Studies/Results: Dg Chest Port 1 View  Result Date: 09/29/2017 CLINICAL DATA:  History of cardiomyopathy EXAM: PORTABLE CHEST 1 VIEW COMPARISON:  09/29/2017, 09/28/2017, 09/21/2017, CT 09/22/2017 FINDINGS: Esophageal tube tip appears folded back upon itself in the region of the stomach. Endotracheal tube is removed. Right upper extremity catheter tip overlies the right atrium. Increased right pleural effusion and basilar airspace disease. Stable cardiomediastinal silhouette. No pneumothorax. IMPRESSION: 1. Removal of endotracheal tube. Esophageal tube tip appears folded back upon itself in the region of the proximal stomach 2. Development of small right pleural effusion with worsened right basilar consolidation and mild volume loss/shift to the right Electronically Signed   By: Jasmine Pang M.D.   On: 09/29/2017 22:06   Dg Chest Port 1 View  Result Date: 09/29/2017 CLINICAL  DATA:  Acute respiratory failure, hypoxia EXAM: PORTABLE CHEST 1 VIEW COMPARISON:  Chest x-ray of 09/28/2017 and CT chest of 09/22/2017 FINDINGS: The tip of the endotracheal tube is approximately 3.8 cm above the carina. Right PICC line tip overlies the expected right atrium. NG tube extends into the stomach. There is mild linear atelectasis at the lung bases, and minimal opacity in the medial left upper lung possibly residual of patchy opacities noted by CT chest. Pneumonia cannot be excluded. No effusion is seen. Mild cardiomegaly is stable. IMPRESSION: 1. Tip of endotracheal tube approximately 3.8 cm above the carina. 2. Minimal  opacity medially in the left upper lobe may be residual of prior patchy opacities noted by CT of the chest. Possible area of pneumonia. 3. Mild basilar atelectasis. Electronically Signed   By: Dwyane Dee M.D.   On: 09/29/2017 09:50   [place] Ekg Site Rite  Result Date: 09/28/2017 If Site Rite image not attached, placement could not be confirmed due to current cardiac rhythm.   Anti-infectives: Anti-infectives (From admission, onward)   Start     Dose/Rate Route Frequency Ordered Stop   09/27/17 1000  piperacillin-tazobactam (ZOSYN) IVPB 3.375 g     3.375 g 12.5 mL/hr over 240 Minutes Intravenous Every 8 hours 09/27/17 0845       Assessment/Plan History ADD, anxiety, depression, polysubstance abuse - home meds currently held; will defer anxiety treatment to CCM Protein-calorie malnutirition - TNA Acute blood loss anemia and anemia of chronic disease - 1,000cc sanguinous drainage in JP on 9/12; transfused pRBC and FFP 9/12; continue to hold hep gtt today, possibly resume tomorrow. Acute hypoxic respiratory failure, aspiration PNA - per CCM, extubated 9/12  Acute bilateral PE - per CCM, holding heparin due to bloody high output JP  Emesis and possible aspiration  Colonic inertia Megacolon with rectal fecal impaction POD#3 S/P total abdominal colectomy, ileostomy, Dr. Magnus Ivan, 09/10 - NG 7,000 cc/24h, continue NG tube to LIWS - hold tube feeds  - continue JP, 370 cc/24h, follow ouput  - await return of bowel function   FEN:NPO, NGT, TPN VTE: SCD's,holding heparin ZO:XWRUE 09/10>> Foley:placed 9/10 Follow up:Dr. Magnus Ivan   LOS: 8 days    Hosie Spangle, [facility] Surgery Pager: [phone]

## 2017-09-30 NOTE — Procedures (Signed)
Intubation Procedure Note Philip Thompson [id number] 03/13/1990  Procedure: Intubation Indications: Respiratory insufficiency  Procedure Details Consent: Risks of procedure as well as the alternatives and risks of each were explained to the (patient/caregiver).  Consent for procedure obtained. Time Out: Verified patient identification, verified procedure, site/side was marked, verified correct patient position, special equipment/implants available, medications/allergies/relevent history reviewed, required imaging and test results available.  Performed  Drugs Etomidate 20mg  IV, versed 2mg  IV, Fentanyl IV, Rocuronium 80mg  IV DL x 1 with GS 3 blade Grade 1 view 7.5 ETT tube passed through cords under direct visualization Placement confirmed with bilateral breath sounds, positive EtCO2 change and smoke in tube   Evaluation Hemodynamic Status: BP stable throughout; O2 sats: stable throughout Patient's Current Condition: stable Complications: No apparent complications Patient did tolerate procedure well. Chest X-ray ordered to verify placement.  CXR: pending.   Max Fickle 09/30/2017

## 2017-09-30 NOTE — Progress Notes (Signed)
PHARMACY - ADULT TOTAL PARENTERAL NUTRITION CONSULT NOTE   Pharmacy Consult for TPN Indication: massive bowel resection  Patient Measurements: Height: 5' 7.99" (172.7 cm) Weight: 186 lb 1.1 oz (84.4 kg) IBW/kg (Calculated) : 68.38  TPN AdjBW (KG): 77.8 Body mass index is 28.3 kg/m. Usual Weight: 87 kg  Assessment:  [name] admitted on 9/4 with complete colonic inertia, megacolon and severe obstructive constipation. Pt indicates poor intake and weight loss PTA over 1 month time frame. 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 Was found to have a massively distended colon from the cecum all the way to distal rectum and pelvic rim.  No evidence of perforation. Pharmacy has been consulted to start TPN for severe malnutrition related to chronic illness (cardiomyopathy, substance abuse, chronic constipation/obstruction with megacolon) associated with severe muscle and fat depletion and edema.  GI: Ogilvie's syndrome - s/p colectomy and ileostomy 9/10. NPO 8 days (since admission) with poor oral intake PTA. JP drain output down to over last 24 h. NGT was found to be clogged overnight resulting in patient having large volume emesis and aspiration, NGT was flushed with immediate 2L output (total output 7L!).  LBM 9/13. TPN initiated 9/12. Wt down significantly (-32.7 lbs) since admission likely due to amount of colon removed.  Endo: hypothyroidism (TSH 6.6 and T4 0.62 on 9/1) - continue levothyroxine. Glucose range 89-125 mg/dL. Insulin requirements in the past 24 hours: none Lytes: K dropped to 3.2, Phos remains low at 2.4 despite NaPhos given 9/12 outside of TPN; Mg 1.9; Corrected Ca 10 Renal: SCr improving 0.53, BUN improving 20>15>12. UOP 1.7 L yesterday. I/O -7.3L (most from NGT), Net -1.1L. Pulm: procedural intubation 9/10 - extubated 9/12; re-intubated 9/13.  Cards: bilateral PE noted on admission likely provoked by lower extremity DVT in the  setting of pelvic vein compression - on heparin drip (currently on hold for increased JP drain output - plan to possibly resume 9/14). CT did not show evidence of RV strain. LVEF 55-60%. ST and BP elevated (150s/90s). Hepatobil: LFTs wnl, tbili wnl, TG 156 (on propofol - now off) Neuro: history of MDD, substance abuse disorder, ADD - Medications PTA: buspirone, citalopram, divalproex, gabapentin (agitation), quetiapine, and trazodone. Fentanyl and Propofol now off. Precedex added for sedation. Pain score 7-9. RASS 1-2.  ID: [id number] empiric zosyn to cover for possible aspiration, WBC wnl, afebrile (Tmax 100)  TPN Access: PICC triple lumen placed 9/11 TPN start date: 9/12 Nutritional Goals (per RD recommendation on 9/11): KCal: 2244 kcal  Protein: 130-160 g Fluid: > 2 L  Goal TPN rate is 90 ml/hr.  Current Nutrition: none  Plan:  Change TPN to 45 mL/hr (based on readjusted goal) -no increase today due to electrolyte abnormalities. Will add Lipids today now off Propofol.  This TPN provides  66g of protein,  173g of dextrose, and  25g of lipids meeting 50% of patient's needs. Electrolytes in TPN: increase K, increase Phos, LX:BWIOMBT 1:1 Add MVI and trace elements to TPN Discontinue Pepcid IV and add to TPN Add sensitive SSI q6h for now and monitor.   Monitor TPN labs. Patient is at risk for refeeding.  Replace KPhos 15 mmol IV x1 + KCl 10 mEq IV x2 today outside TPN.   Link Snuffer, PharmD, BCPS, BCCCP Clinical Pharmacist Clinical phone 09/30/2017 until 3:30PM [phone] Please refer to [facility] Pharmacy numbers 09/30/2017       8:25 AM

## 2017-09-30 NOTE — Progress Notes (Addendum)
ANTICOAGULATION CONSULT NOTE - Follow Up Consult  Pharmacy Consult for Heparin Indication: pulmonary embolus  No Known Allergies  Patient Measurements: Height: 5' 7.99" (172.7 cm) Weight: 186 lb 1.1 oz (84.4 kg) IBW/kg (Calculated) : 68.38 Heparin Dosing Weight: 91.6 kg  Vital Signs: Temp: 99.1 F (37.3 C) (09/13 0755) Temp Source: Axillary (09/13 0755) BP: 155/95 (09/13 0800) Pulse Rate: 117 (09/13 0800)  Labs: Recent Labs    09/27/17 1613  09/28/17 0328 09/28/17 0957 09/28/17 1739 09/29/17 0520 09/29/17 0853 09/29/17 1805 09/30/17 0627  HGB 13.1   < > 12.3*  --  9.4* 8.0* 8.0* 9.4* 10.6*  HCT 39.5   < > 37.1*  --  28.9* 25.6* 25.5* 29.1* 31.8*  PLT 129*   < > 162  --  103* 101*  --   --  137*  HEPARINUNFRC  --   --  0.27* 0.35  --  0.35  --   --   --   CREATININE 0.86  --  0.98  --   --  0.63  --   --   --    < > = values in this interval not displayed.    Estimated Creatinine Clearance: 146.7 mL/min (by C-G formula based on SCr of 0.63 mg/dL).   Assessment: [name] w/ acute pulmonary embolism (This is felt related to GI issue / impediment in venous return). CBC has remained stable post-op. RN reports no bleeding.  Heparin level has been on hold per surgery and CCM since 9/12 ~ 0600 due to increased output from JP drain. Ok to resume heparin tomorrow, 9/14, per surgery.  Last heparin level therapeutic on heparin 2050 units/hr  Goal of Therapy:  Heparin level 0.3-0.7 units/ml Monitor platelets by anticoagulation protocol: Yes   Plan:  Continue to hold heparin today Plan to resume IV heparin tomorrow at 2050 units/hr Continue to monitor for post-operative bleeding and drain output.   Plan for Xarelto at discharge per cardiology  Danae Orleans, PharmD PGY1 Pharmacy Resident Phone [phone] 09/30/2017       9:36 AM

## 2017-09-30 NOTE — Progress Notes (Signed)
Philip Thompson  ZOX:[id number] DOB: 1990/07/20 DOA: 09/21/2017 PCP: [name], MD    LOS: 8 days   Reason for Consult / Chief Complaint:  Pre op acute hypoxemic respiratory failure   Consulting MD and date:  [name]  HPI/Summary of hospital stay:  Philip Thompson admitted with colonic inertia/megacolon and bilateral pulmonary emboli and DVT due to pelvic vein obstruction.  He had acute aspiration pneumonia on 9/10 prior to surgery in the setting of nausea/vomiting.  He went to the OR on 9/10 for total colectomy and ileostomy.    Subjective:  Extubated on 9/12 but vomited and aspirated overnight Had > 5L output from NG tube Significant anxiety overnight  Objective   Blood pressure (!) 155/95, pulse (!) 117, temperature 99.1 F (37.3 C), temperature source Axillary, resp. rate (!) 49, height 5' 7.99" (1.727 m), weight 84.4 kg, SpO2 92 %.    FiO2 (%):  [100 %] 100 %   Intake/Output Summary (Last 24 hours) at 09/30/2017 0925 Last data filed at 09/30/2017 0800 Gross per 24 hour  Intake 3256.17 ml  Output 81191 ml  Net -7303.83 ml   Filed Weights   09/28/17 0500 09/29/17 0500 09/30/17 0500  Weight: 99.1 kg 93.2 kg 84.4 kg    Examination:  General:  Anxious, marked respiratory distress HENT: NCAT OP clear PULM: Diminished R base B, normal effort CV: RRR, no mgr GI: no bowel sounds, improved distension MSK: normal bulk and tone Neuro: awake, alert, very anxious, MAEW    Consults: date of consult/date signed off & final recs:   General surgery 9/6 GI 9/6 Critical care 9/10   Procedures: 9/10 - Total colectomy and ileostomy.  Was found to have a massively distended colon from the cecum all the way to distal rectum and pelvic rim.  No evidence of perforation. Oral endotracheal tube 9/10 >> 9/12 PICC 9/11>>   Significant Diagnostic Tests: 9/5 CT chest, abdomen and pelvis: Bilateral pulmonary emboli without evidence of right heart strain.  Diffuse emphysematous  changes in the lungs.  Patchy airspace disease.  Massively distended colon throughout.  Was filled with both gas and mostly stool.  There is a large amount of stool in the rectum with rectal wall thickening  Micro Data: Sputum culture 9/10 >>  Antimicrobials:  Zosyn (possible aspiration) 9/10 >>   Resolved Hospital Problem list    Assessment & Plan:  Acute respiratory failure with hypoxemia: last nights CXR shows worsening infiltrate in the RLL due to aspiration pneumonia; at this point he is developing worsening respiratory failure complicated by anxiety but not relieved with anxiolytics > intubate this morning > bag lavage/suctioning > full mechanical vent support > PAD protocol for sedation, RASS goal -1  Colonic inertia/megacolon s/p colectomy/ileostomy > continue NG to low wall suction > hold tube feeding > TNA > wound care per surgery  Bilateral PE > resume Heparin on 9/14 per surgery  Anxiety/Depression and multiple home pysche meds  > versed prn > resume depakote IV  Need for sedation with mechanical ventilation: > RASS goal -1 > precedex, prn versed/fent    Disposition / Summary of Today's Plan 09/30/17   Remain in ICU    Feeding: tube feeidng Analgesia: minimized Sedation: RASS goal 0, minimize Thromboprophylaxis: lovenox HOB >30 degrees Ulcer prophylaxis: famotidine Glucose control: adequate   Labs   CBC: Recent Labs  Lab 09/27/17 1803 09/28/17 0328 09/28/17 1739 09/29/17 0520 09/29/17 0853 09/29/17 1805 09/30/17 0627  WBC 8.6 8.2  4.4 4.4  --   --  8.8  NEUTROABS  --   --   --  2.1  --   --   --   HGB 12.8* 12.3* 9.4* 8.0* 8.0* 9.4* 10.6*  HCT 39.4 37.1* 28.9* 25.6* 25.5* 29.1* 31.8*  MCV 86.4 85.5 88.7 88.3  --   --  85.7  PLT 141* 162 103* 101*  --   --  137*   Basic Metabolic Panel: Recent Labs  Lab 09/24/17 1135  09/26/17 0626 09/27/17 0423 09/27/17 1226 09/27/17 1613 09/28/17 0328 09/29/17 0520  NA 138   < > 139 137 138  138 138 137  K 3.5   < > 3.6 4.1 4.5 4.2 4.4 4.1  CL 104   < > 106 103  --  104 102 104  CO2 26   < > 23 22  --  19* 26 28  GLUCOSE 102*   < > 108* 125* 107* 113* 110* 95  BUN 11   < > 11 17  --  20 15 12   CREATININE 0.81   < > 0.74 1.12  --  0.86 0.98 0.63  CALCIUM 9.2   < > 8.9 9.2  --  8.4* 8.1* 7.6*  MG 1.7  --  1.7 1.6*  --   --  1.9 1.9  PHOS 3.7  --  4.3 4.7*  --   --  3.6 2.3*   < > = values in this interval not displayed.   GFR: Estimated Creatinine Clearance: 146.7 mL/min (by C-G formula based on SCr of 0.63 mg/dL). Recent Labs  Lab 09/28/17 0328 09/28/17 1739 09/29/17 0520 09/30/17 0627  WBC 8.2 4.4 4.4 8.8   Liver Function Tests: Recent Labs  Lab 09/24/17 0016 09/28/17 0328 09/29/17 0520  AST 15 29 21   ALT 13 25 22   ALKPHOS 74 58 50  BILITOT 0.6 0.8 0.8  PROT 6.5 5.0* 3.9*  ALBUMIN 3.6 2.6* 1.9*   No results for input(s): LIPASE, AMYLASE in the last 168 hours. No results for input(s): AMMONIA in the last 168 hours. ABG    Component Value Date/Time   PHART 7.378 09/28/2017 0355   PCO2ART 47.8 09/28/2017 0355   PO2ART 165.0 (H) 09/28/2017 0355   HCO3 28.0 09/28/2017 0355   TCO2 29 09/28/2017 0355   ACIDBASEDEF 1.0 09/27/2017 1551   O2SAT 99.0 09/28/2017 0355    Coagulation Profile: Recent Labs  Lab 09/26/17 0626  INR 1.26   Cardiac Enzymes: Recent Labs  Lab 09/24/17 0016  TROPONINI <0.03   HbA1C: Hgb A1c MFr Bld  Date/Time Value Ref Range Status  03/27/2017 06:54 AM 5.4 4.8 - 5.6 % Final    Comment:    (NOTE) Pre diabetes:          5.7%-6.4% Diabetes:              >6.4% Glycemic control for   <7.0% adults with diabetes   07/02/2016 07:34 AM 5.4 4.8 - 5.6 % Final    Comment:    (NOTE)         Pre-diabetes: 5.7 - 6.4         Diabetes: >6.4         Glycemic control for adults with diabetes: <7.0    CBG: Recent Labs  Lab 09/29/17 0757  GLUCAP 89     Review of Systems:    Past medical history  He,  has a past medical  history of ADD (attention deficit disorder), Aggression,  Allergic rhinitis, Anxiety, Arthritis, Cardiomyopathy (HCC), Chronic back pain, Constipation, Depression, Fecal obstruction (HCC), Frontal head injury, Heroin overdose (HCC), Obstipation, Polysubstance abuse (HCC), and Suicide attempt (HCC).   Surgical History    Past Surgical History:  Procedure Laterality Date  . COLECTOMY N/A 09/27/2017   Procedure: SUBTOTAL COLECTOMY AND ILEOSTOMY;  Surgeon: Abigail Miyamoto, MD;  Location: [facility] OR;  Service: General;  Laterality: N/A;  . FOOT SURGERY     to get a BB out  . NO PAST SURGERIES       Social History   Social History   Socioeconomic History  . Marital status: Single    Spouse name: Not on file  . Number of children: Not on file  . Years of education: Not on file  . Highest education level: 12th grade  Occupational History  . Not on file  Social Needs  . Financial resource strain: Somewhat hard  . Food insecurity:    Worry: Never true    Inability: Never true  . Transportation needs:    Medical: No    Non-medical: No  Tobacco Use  . Smoking status: Current Every Day Smoker    Packs/day: 1.00    Years: 7.00    Pack years: 7.00    Types: Cigarettes  . Smokeless tobacco: Never Used  Substance and Sexual Activity  . Alcohol use: Not Currently    Comment: hx of. last use 01/2016  . Drug use: Yes    Types: Marijuana, Cocaine    Comment: last used marijuana 3 weeks ago. heroin last used  6 months ago.   Marland Kitchen Sexual activity: Yes    Birth control/protection: Condom  Lifestyle  . Physical activity:    Days per week: 0 days    Minutes per session: 0 min  . Stress: To some extent  Relationships  . Social connections:    Talks on phone: More than three times a week    Gets together: Twice a week    Attends religious service: Never    Active member of club or organization: No    Attends meetings of clubs or organizations: Never    Relationship status: Never married  .  Intimate partner violence:    Fear of current or ex partner: No    Emotionally abused: No    Physically abused: No    Forced sexual activity: No  Other Topics Concern  . Not on file  Social History Narrative  . Not on file  ,  reports that he has been smoking cigarettes. He has a 7.00 pack-year smoking history. He has never used smokeless tobacco. He reports that he drank alcohol. He reports that he has current or past drug history. Drugs: Marijuana and Cocaine.   Family history   His family history includes Alcohol abuse in his father; Cancer in his paternal grandfather; Cancer - Lung in his paternal grandmother; Cancer - Ovarian in his maternal grandmother; Dementia in his father; Diabetes in his brother and other; Heart attack in his other; Heart disease in his other; Hypertension in his other.   Allergies No Known Allergies  Home meds  Prior to Admission medications   Medication Sig Start Date End Date Taking? Authorizing Provider  busPIRone (BUSPAR) 15 MG tablet Take 1 tablet (15 mg total) by mouth 3 (three) times daily. 09/07/17  Yes Eksir, Bo Mcclintock, MD  citalopram (CELEXA) 40 MG tablet Take 1 tablet (40 mg total) by mouth daily. 09/07/17 09/07/18 Yes Eksir, Bo Mcclintock, MD  cyclobenzaprine (FLEXERIL) 10 MG tablet Take 1 tablet (10 mg total) by mouth 3 (three) times daily as needed for muscle spasms. 03/29/17  Yes Armandina Stammer I, NP  divalproex (DEPAKOTE) 500 MG DR tablet Take 1 tablet (500 mg total) by mouth every 12 (twelve) hours. For mood stabilization 09/07/17  Yes Eksir, Bo Mcclintock, MD  docusate sodium (COLACE) 100 MG capsule Take 1 capsule (100 mg total) by mouth 2 (two) times daily. Patient taking differently: Take 100 mg by mouth daily.  09/19/17  Yes Tat, Onalee Hua, MD  furosemide (LASIX) 20 MG tablet Take 1 tablet (20 mg total) by mouth daily. Prn leg swelling Patient taking differently: Take 20 mg by mouth daily as needed for fluid or edema. As needed for leg swelling  05/11/17  Yes Sherie Don C, NP  gabapentin (NEURONTIN) 300 MG capsule Take 2 capsules (600 mg total) by mouth 3 (three) times daily. For agitation 09/07/17 09/07/18 Yes Eksir, Bo Mcclintock, MD  ibuprofen (ADVIL,MOTRIN) 600 MG tablet Take 1 tablet (600 mg total) by mouth every 6 (six) hours as needed for moderate pain. (May buy from over the counter): For pain 03/29/17  Yes Armandina Stammer I, NP  levothyroxine (SYNTHROID) 50 MCG tablet Take 1 tablet (50 mcg total) by mouth daily before breakfast. 09/19/17  Yes Tat, Onalee Hua, MD  linaclotide [facility]) 290 MCG CAPS capsule Take 1 capsule (290 mcg total) by mouth daily before breakfast. 09/20/17  Yes Tat, Onalee Hua, MD  potassium chloride (K-DUR,KLOR-CON) 10 MEQ tablet Take 1 tablet (10 mEq total) by mouth daily. With furosemide Patient taking differently: Take 10 mEq by mouth daily as needed (only takes when Lasix (Furosemide)). With furosemide 05/11/17  Yes Campbell Riches, NP  QUEtiapine (SEROQUEL) 400 MG tablet Take 2 tablets (800 mg total) by mouth at bedtime. 09/07/17  Yes Eksir, Bo Mcclintock, MD  traMADol (ULTRAM) 50 MG tablet Take 50 mg by mouth every 12 (twelve) hours as needed for moderate pain or severe pain.  05/03/17  Yes [provider]  traZODone (DESYREL) 100 MG tablet Take 1 tablet (100 mg total) by mouth at bedtime. 09/07/17  Yes Eksir, Bo Mcclintock, MD      My cc time 40 minutes  Heber [place], MD [place] [facility] Pager: [phone] Cell: [phone] After 3pm or if no response, call [phone]

## 2017-09-30 NOTE — Progress Notes (Signed)
RT called to pt's room. pt is agitated and SP02 dropped into the 80's after CPT was performed.  RT bagged the pt. [name] SP02 maintained in the 80's after bagging for several minutes. Marland Kitchen  CCM called to pt's room.  PEEP and FI02 were increased on the vent.  Pt sedation was also increased.  Sp02 increased in to the upper 90's.  PEEP and Fi02 weaned as tolerated.  PEEP is now back to previous settings.  FI02 is 80%. [name] Sp02 is now 95% and pt looks comfortable.

## 2017-09-30 NOTE — Addendum Note (Signed)
Addendum  created 09/30/17 0820 by [name], MD   Attestation recorded in Intraprocedure, Intraprocedure Attestations deleted, Intraprocedure Attestations filed

## 2017-10-01 ENCOUNTER — Inpatient Hospital Stay (HOSPITAL_COMMUNITY): Payer: BLUE CROSS/BLUE SHIELD

## 2017-10-01 LAB — BASIC METABOLIC PANEL
ANION GAP: 8 (ref 5–15)
Anion gap: 6 (ref 5–15)
BUN: 6 mg/dL (ref 6–20)
BUN: 6 mg/dL (ref 6–20)
CALCIUM: 7.9 mg/dL — AB (ref 8.9–10.3)
CALCIUM: 8.1 mg/dL — AB (ref 8.9–10.3)
CO2: 27 mmol/L (ref 22–32)
CO2: 27 mmol/L (ref 22–32)
CREATININE: 0.44 mg/dL — AB (ref 0.61–1.24)
Chloride: 105 mmol/L (ref 98–111)
Chloride: 107 mmol/L (ref 98–111)
Creatinine, Ser: 0.46 mg/dL — ABNORMAL LOW (ref 0.61–1.24)
GFR calc Af Amer: >60 mL/min (ref >60)
GFR calc non Af Amer: >60 mL/min (ref >60)
Glucose, Bld: 132 mg/dL — ABNORMAL HIGH (ref 70–99)
Glucose, Bld: 136 mg/dL — ABNORMAL HIGH (ref 70–99)
Potassium: 3.3 mmol/L — ABNORMAL LOW (ref 3.5–5.1)
Potassium: 4.6 mmol/L (ref 3.5–5.1)
SODIUM: 140 mmol/L (ref 135–145)
SODIUM: 140 mmol/L (ref 135–145)

## 2017-10-01 LAB — CBC WITH DIFFERENTIAL/PLATELET
BASOS ABS: 0 10*3/uL (ref 0.0–0.1)
BASOS PCT: 0 %
EOS ABS: 0.1 10*3/uL (ref 0.0–0.7)
Eosinophils Relative: 1 %
HCT: 26.4 % — ABNORMAL LOW (ref 39.0–52.0)
Hemoglobin: 8.8 g/dL — ABNORMAL LOW (ref 13.0–17.0)
LYMPHS PCT: 15 %
Lymphs Abs: 1.1 10*3/uL (ref 0.7–4.0)
MCH: 28.7 pg (ref 26.0–34.0)
MCHC: 33.3 g/dL (ref 30.0–36.0)
MCV: 86 fL (ref 78.0–100.0)
Monocytes Absolute: 1.4 10*3/uL — ABNORMAL HIGH (ref 0.1–1.0)
Monocytes Relative: 19 %
NEUTROS PCT: 65 %
Neutro Abs: 4.6 10*3/uL (ref 1.7–7.7)
PLATELETS: 115 10*3/uL — AB (ref 150–400)
RBC: 3.07 MIL/uL — AB (ref 4.22–5.81)
RDW: 14.2 % (ref 11.5–15.5)
WBC: 7.2 10*3/uL (ref 4.0–10.5)

## 2017-10-01 LAB — CULTURE, RESPIRATORY W GRAM STAIN: Culture: NORMAL

## 2017-10-01 LAB — GLUCOSE, CAPILLARY
GLUCOSE-CAPILLARY: 109 mg/dL — AB (ref 70–99)
GLUCOSE-CAPILLARY: 118 mg/dL — AB (ref 70–99)
GLUCOSE-CAPILLARY: 120 mg/dL — AB (ref 70–99)
GLUCOSE-CAPILLARY: 120 mg/dL — AB (ref 70–99)
GLUCOSE-CAPILLARY: 125 mg/dL — AB (ref 70–99)

## 2017-10-01 LAB — CULTURE, RESPIRATORY

## 2017-10-01 LAB — MAGNESIUM: MAGNESIUM: 1.9 mg/dL (ref 1.7–2.4)

## 2017-10-01 LAB — CBC
HCT: 27.7 % — ABNORMAL LOW (ref 39.0–52.0)
Hemoglobin: 8.9 g/dL — ABNORMAL LOW (ref 13.0–17.0)
MCH: 28.2 pg (ref 26.0–34.0)
MCHC: 32.1 g/dL (ref 30.0–36.0)
MCV: 87.7 fL (ref 78.0–100.0)
Platelets: 117 10*3/uL — ABNORMAL LOW (ref 150–400)
RBC: 3.16 MIL/uL — ABNORMAL LOW (ref 4.22–5.81)
RDW: 14.3 % (ref 11.5–15.5)
WBC: 6.9 10*3/uL (ref 4.0–10.5)

## 2017-10-01 LAB — PHOSPHORUS: PHOSPHORUS: 2.9 mg/dL (ref 2.5–4.6)

## 2017-10-01 LAB — PREPARE RBC (CROSSMATCH)

## 2017-10-01 MED ORDER — SODIUM CHLORIDE 3 % IN NEBU
4.0000 mL | INHALATION_SOLUTION | Freq: Two times a day (BID) | RESPIRATORY_TRACT | Status: AC
  Administered 2017-10-01 – 2017-10-03 (×6): 4 mL via RESPIRATORY_TRACT
  Filled 2017-10-01 (×6): qty 4

## 2017-10-01 MED ORDER — IOHEXOL 300 MG/ML  SOLN
100.0000 mL | Freq: Once | INTRAMUSCULAR | Status: AC | PRN
  Administered 2017-10-01: 100 mL via INTRAVENOUS

## 2017-10-01 MED ORDER — HEPARIN (PORCINE) IN NACL 100-0.45 UNIT/ML-% IJ SOLN: 2050.0000 [IU]/h | INTRAMUSCULAR | Status: DC

## 2017-10-01 MED ORDER — SODIUM CHLORIDE 0.9% IV SOLUTION
Freq: Once | INTRAVENOUS | Status: AC
  Administered 2017-10-01: 11:00:00 via INTRAVENOUS

## 2017-10-01 MED ORDER — TRAVASOL 10 % IV SOLN
INTRAVENOUS | Status: AC
  Administered 2017-10-01: 18:00:00 via INTRAVENOUS
  Filled 2017-10-01: qty 1024.8

## 2017-10-01 MED ORDER — POTASSIUM CHLORIDE 10 MEQ/50ML IV SOLN
10.0000 meq | INTRAVENOUS | Status: AC
  Administered 2017-10-01 (×4): 10 meq via INTRAVENOUS
  Filled 2017-10-01: qty 50

## 2017-10-01 NOTE — Progress Notes (Signed)
ANTICOAGULATION CONSULT NOTE - Follow Up Consult  Pharmacy Consult for Heparin Indication: pulmonary embolus  No Known Allergies  Patient Measurements: Height: 5' 7.99" (172.7 cm) Weight: 168 lb 3.4 oz (76.3 kg) IBW/kg (Calculated) : 68.38 Heparin Dosing Weight: 91.6 kg  Vital Signs: Temp: 98.8 F (37.1 C) (09/14 0725) Temp Source: Oral (09/14 0725) BP: 112/69 (09/14 0700) Pulse Rate: 85 (09/14 0700)  Labs: Recent Labs    09/28/17 0957  09/29/17 0520  09/29/17 1805 09/30/17 0627 09/30/17 0854 10/01/17 0530  HGB  --    < > 8.0*   < > 9.4* 10.6*  --  8.8*  HCT  --    < > 25.6*   < > 29.1* 31.8*  --  26.4*  PLT  --    < > 101*  --   --  137*  --  115*  HEPARINUNFRC 0.35  --  0.35  --   --   --   --   --   CREATININE  --   --  0.63  --   --   --  0.53* 0.46*   < > = values in this interval not displayed.    Estimated Creatinine Clearance: 134.2 mL/min (A) (by C-G formula based on SCr of 0.46 mg/dL (L)).   Assessment: 28 yo M w/ acute pulmonary embolism (This is felt related to GI issue / impediment in venous return). CBC has remained stable post-op. RN reports no bleeding.  Heparin level has been on hold per surgery and CCM since 9/12 ~ 0600 due to increased output from JP drain. To resume today  Last heparin level therapeutic on heparin 2050 units/hr  H&H 8.8/26.4, Plt 115  Goal of Therapy:  Heparin level 0.3-0.7 units/ml Monitor platelets by anticoagulation protocol: Yes   Plan:  IV heparin at 2050 units/hr Hep lvl 1700 Watch for bleeding  Isaac Bliss, PharmD, BCPS, BCCCP Clinical Pharmacist [phone]  Please check AMION for all [facility] Pharmacy numbers  10/01/2017 8:39 AM

## 2017-10-01 NOTE — Progress Notes (Signed)
[facility]  Nursing has noted bloody drainage from the JP drain Cannot restart heparin infusion Will need retrievable IVC filter  Heber Kobuk, MD [place] [facility] Pager: [phone] Cell: [phone] After 3pm or if no response, call [phone]

## 2017-10-01 NOTE — Procedures (Signed)
PCCM Video Bronchoscopy Procedure Note  The patient was informed of the risks (including but not limited to bleeding, infection, respiratory failure, lung injury, tooth/oral injury) and benefits of the procedure and gave consent, see chart.  Indication: Mucus plugging RLL  Post Procedure Diagnosis: same  Location: [facility] medical ICU  Condition pre procedure: critically ill, on vent  Medications for procedure: versed infusion, precedex infusion, fentanyl IV push  Procedure description: The bronchoscope was introduced through the endotracheal tube and passed to the bilateral lungs to the level of the subsegmental bronchi throughout the tracheobronchial tree.  Airway exam revealed thick mucus plugging the bronchus intermedius and the RLL completely.  This was partially suctioned but could not be completely removed.  I lavaged saline on the mucus plug.  Attempts will be made to remove the plug using bag lavage.   Procedures performed: washing RLL  Specimens sent: none  Condition post procedure: critically ill, on veht  EBL: none  Complications: none  Heber Whipholt, MD [place] [facility] Pager: [phone] Cell: [phone] After 3pm or if no response, call [phone]

## 2017-10-01 NOTE — Progress Notes (Signed)
Philip Thompson  WHQ:[id number] DOB: [birth year] DOA: 09/21/2017 PCP: [name], MD    LOS: 9 days   Reason for Consult / Chief Complaint:  Pre op acute hypoxemic respiratory failure   Consulting MD and date:  Philip Thompson  HPI/Summary of hospital stay:  27 y/o male admitted with colonic inertia/megacolon and bilateral pulmonary emboli and DVT due to pelvic vein obstruction.  He had acute aspiration pneumonia on 9/10 prior to surgery in the setting of nausea/vomiting.  He went to the OR on 9/10 for total colectomy and ileostomy.    Subjective:  Quiet night, no acute events Intubated yesterday for worsening respiratory failure, RLL pneumonia PLT/HGB count down  Objective   Blood pressure 112/69, pulse 85, temperature 98.8 F (37.1 C), temperature source Oral, resp. rate 20, height 5' 7.99" (1.727 m), weight 76.3 kg, SpO2 100 %.    Vent Mode: PRVC FiO2 (%):  [40 %-100 %] 40 % Set Rate:  [16 bmp-20 bmp] 16 bmp Vt Set:  [550 mL] 550 mL PEEP:  [5 cmH20] 5 cmH20 Plateau Pressure:  [15 cmH20-22 cmH20] 19 cmH20   Intake/Output Summary (Last 24 hours) at 10/01/2017 0809 Last data filed at 10/01/2017 0600 Gross per 24 hour  Intake 2214.21 ml  Output 6150 ml  Net -3935.79 ml   Filed Weights   09/29/17 0500 09/30/17 0500 10/01/17 0500  Weight: 93.2 kg 84.4 kg 76.3 kg    Examination:  General:  In bed on vent HENT: NCAT ETT in place PULM: Diminished R base B, vent supported breathing CV: RRR, no mgr GI: minimal bowel sounds, ileostomy in place, midline scar well healed MSK: normal bulk and tone Neuro: sedated on vent    Consults: date of consult/date signed off & final recs:   General surgery 9/6 GI 9/6 Critical care 9/10   Procedures: 9/10 - Total colectomy and ileostomy.  Was found to have a massively distended colon from the cecum all the way to distal rectum and pelvic rim.  No evidence of perforation. Oral endotracheal tube 9/10 >> 9/12 PICC 9/11>>    Significant Diagnostic Tests: 9/5 CT chest, abdomen and pelvis: Bilateral pulmonary emboli without evidence of right heart strain.  Diffuse emphysematous changes in the lungs.  Patchy airspace disease.  Massively distended colon throughout.  Was filled with both gas and mostly stool.  There is a large amount of stool in the rectum with rectal wall thickening  Micro Data: Sputum culture 9/10 >>  Antimicrobials:  Zosyn (possible aspiration) 9/10 >>   Resolved Hospital Problem list    Assessment & Plan:  Acute respiratory failure with hypoxemia due to aspiration pneumonia and mucus plugging the RLL > bag lavage airways q shift > continue hypertonic saline > full vent support > repeat CXR in AM > likely repeat bronchoscopy on 9/15  Thrombocytopenia: Spurious? (Multiple labs down today except Na/Cl, suspect saline draw) > repeat CBC > monitor for bleeding  DVT/PE > heparin infusion today  Anemia> again, I think this may be spurious, no sign of bleeding > repeat CBC > monitor for bleeding  Ogilvie/obwel obstruction s/p colectomy and ileostomy > would care per surgery  Protein calorie malnutrition > TPN  Hypokalemia > replace, repeat BMET  Anxiety/depression; > depakote  Need for sedation while on mechanical ventilation: > PAD protocol > RASS goal -1 > precedex, versed infusion > fentanyl prn (minimizing narcotics due to ogilvie's)    Disposition / Summary of Today's Plan 10/01/17   Remain  in ICU    Feeding: TPN Analgesia: minimized Sedation: RASS goal -1, versed, precedex Thromboprophylaxis: resume heparin infusion today HOB >30 degrees Ulcer prophylaxis: famotidine Glucose control: adequate   Labs   CBC: Recent Labs  Lab 09/28/17 0328 09/28/17 1739 09/29/17 0520 09/29/17 0853 09/29/17 1805 09/30/17 0627 10/01/17 0530  WBC 8.2 4.4 4.4  --   --  8.8 7.2  NEUTROABS  --   --  2.1  --   --   --  4.6  HGB 12.3* 9.4* 8.0* 8.0* 9.4* 10.6* 8.8*   HCT 37.1* 28.9* 25.6* 25.5* 29.1* 31.8* 26.4*  MCV 85.5 88.7 88.3  --   --  85.7 86.0  PLT 162 103* 101*  --   --  137* 115*   Basic Metabolic Panel: Recent Labs  Lab 09/27/17 0423  09/27/17 1613 09/28/17 0328 09/29/17 0520 09/30/17 0854 10/01/17 0530  NA 137   < > 138 138 137 137 140  K 4.1   < > 4.2 4.4 4.1 3.2* 3.3*  CL 103  --  104 102 104 99 105  CO2 22  --  19* 26 28 29 27   GLUCOSE 125*   < > 113* 110* 95 122* 132*  BUN 17  --  20 15 12  5* 6  CREATININE 1.12  --  0.86 0.98 0.63 0.53* 0.46*  CALCIUM 9.2  --  8.4* 8.1* 7.6* 8.3* 7.9*  MG 1.6*  --   --  1.9 1.9 1.9 1.9  PHOS 4.7*  --   --  3.6 2.3* 2.4* 2.9   < > = values in this interval not displayed.   GFR: Estimated Creatinine Clearance: 134.2 mL/min (A) (by C-G formula based on SCr of 0.46 mg/dL (L)). Recent Labs  Lab 09/28/17 1739 09/29/17 0520 09/30/17 0627 10/01/17 0530  WBC 4.4 4.4 8.8 7.2   Liver Function Tests: Recent Labs  Lab 09/28/17 0328 09/29/17 0520  AST 29 21  ALT 25 22  ALKPHOS 58 50  BILITOT 0.8 0.8  PROT 5.0* 3.9*  ALBUMIN 2.6* 1.9*   No results for input(s): LIPASE, AMYLASE in the last 168 hours. No results for input(s): AMMONIA in the last 168 hours. ABG    Component Value Date/Time   PHART 7.464 (H) 09/30/2017 1107   PCO2ART 36.8 09/30/2017 1107   PO2ART 60.0 (L) 09/30/2017 1107   HCO3 26.3 09/30/2017 1107   TCO2 27 09/30/2017 1107   ACIDBASEDEF 1.0 09/27/2017 1551   O2SAT 92.0 09/30/2017 1107    Coagulation Profile: Recent Labs  Lab 09/26/17 0626  INR 1.26   Cardiac Enzymes: No results for input(s): CKTOTAL, CKMB, CKMBINDEX, TROPONINI in the last 168 hours. HbA1C: Hgb A1c MFr Bld  Date/Time Value Ref Range Status  03/27/2017 06:54 AM 5.4 4.8 - 5.6 % Final    Comment:    (NOTE) Pre diabetes:          5.7%-6.4% Diabetes:              >6.4% Glycemic control for   <7.0% adults with diabetes   07/02/2016 07:34 AM 5.4 4.8 - 5.6 % Final    Comment:    (NOTE)          Pre-diabetes: 5.7 - 6.4         Diabetes: >6.4         Glycemic control for adults with diabetes: <7.0    CBG: Recent Labs  Lab 09/29/17 0757 09/30/17 1214 09/30/17 1608 10/01/17 0609 10/01/17 1610  GLUCAP 89 114* 131* 118* 120*     Review of Systems:    Past medical history  He,  has a past medical history of ADD (attention deficit disorder), Aggression, Allergic rhinitis, Anxiety, Arthritis, Cardiomyopathy (HCC), Chronic back pain, Constipation, Depression, Fecal obstruction (HCC), Frontal head injury, Heroin overdose (HCC), Obstipation, Polysubstance abuse (HCC), and Suicide attempt (HCC).   Surgical History    Past Surgical History:  Procedure Laterality Date  . COLECTOMY N/A 09/27/2017   Procedure: SUBTOTAL COLECTOMY AND ILEOSTOMY;  Surgeon: Abigail Miyamoto, MD;  Location: [facility] OR;  Service: General;  Laterality: N/A;  . FOOT SURGERY     to get a BB out  . NO PAST SURGERIES       Social History   Social History   Socioeconomic History  . Marital status: Single    Spouse name: Not on file  . Number of children: Not on file  . Years of education: Not on file  . Highest education level: 12th grade  Occupational History  . Not on file  Social Needs  . Financial resource strain: Somewhat hard  . Food insecurity:    Worry: Never true    Inability: Never true  . Transportation needs:    Medical: No    Non-medical: No  Tobacco Use  . Smoking status: Current Every Day Smoker    Packs/day: 1.00    Years: 7.00    Pack years: 7.00    Types: Cigarettes  . Smokeless tobacco: Never Used  Substance and Sexual Activity  . Alcohol use: Not Currently    Comment: hx of. last use 01/2016  . Drug use: Yes    Types: Marijuana, Cocaine    Comment: last used marijuana 3 weeks ago. heroin last used  6 months ago.   Marland Kitchen Sexual activity: Yes    Birth control/protection: Condom  Lifestyle  . Physical activity:    Days per week: 0 days    Minutes per session: 0 min  .  Stress: To some extent  Relationships  . Social connections:    Talks on phone: More than three times a week    Gets together: Twice a week    Attends religious service: Never    Active member of club or organization: No    Attends meetings of clubs or organizations: Never    Relationship status: Never married  . Intimate partner violence:    Fear of current or ex partner: No    Emotionally abused: No    Physically abused: No    Forced sexual activity: No  Other Topics Concern  . Not on file  Social History Narrative  . Not on file  ,  reports that he has been smoking cigarettes. He has a 7.00 pack-year smoking history. He has never used smokeless tobacco. He reports that he drank alcohol. He reports that he has current or past drug history. Drugs: Marijuana and Cocaine.   Family history   His family history includes Alcohol abuse in his father; Cancer in his paternal grandfather; Cancer - Lung in his paternal grandmother; Cancer - Ovarian in his maternal grandmother; Dementia in his father; Diabetes in his brother and other; Heart attack in his other; Heart disease in his other; Hypertension in his other.   Allergies No Known Allergies  Home meds  Prior to Admission medications   Medication Sig Start Date End Date Taking? Authorizing Provider  busPIRone (BUSPAR) 15 MG tablet Take 1 tablet (15 mg total) by mouth 3 (  three) times daily. 09/07/17  Yes Eksir, Bo Mcclintock, MD  citalopram (CELEXA) 40 MG tablet Take 1 tablet (40 mg total) by mouth daily. 09/07/17 09/07/18 Yes Eksir, Bo Mcclintock, MD  cyclobenzaprine (FLEXERIL) 10 MG tablet Take 1 tablet (10 mg total) by mouth 3 (three) times daily as needed for muscle spasms. 03/29/17  Yes Armandina Stammer I, NP  divalproex (DEPAKOTE) 500 MG DR tablet Take 1 tablet (500 mg total) by mouth every 12 (twelve) hours. For mood stabilization 09/07/17  Yes Eksir, Bo Mcclintock, MD  docusate sodium (COLACE) 100 MG capsule Take 1 capsule (100 mg total)  by mouth 2 (two) times daily. Patient taking differently: Take 100 mg by mouth daily.  09/19/17  Yes Tat, Onalee Hua, MD  furosemide (LASIX) 20 MG tablet Take 1 tablet (20 mg total) by mouth daily. Prn leg swelling Patient taking differently: Take 20 mg by mouth daily as needed for fluid or edema. As needed for leg swelling 05/11/17  Yes Sherie Don C, NP  gabapentin (NEURONTIN) 300 MG capsule Take 2 capsules (600 mg total) by mouth 3 (three) times daily. For agitation 09/07/17 09/07/18 Yes Eksir, Bo Mcclintock, MD  ibuprofen (ADVIL,MOTRIN) 600 MG tablet Take 1 tablet (600 mg total) by mouth every 6 (six) hours as needed for moderate pain. (May buy from over the counter): For pain 03/29/17  Yes Armandina Stammer I, NP  levothyroxine (SYNTHROID) 50 MCG tablet Take 1 tablet (50 mcg total) by mouth daily before breakfast. 09/19/17  Yes Tat, Onalee Hua, MD  linaclotide [facility]) 290 MCG CAPS capsule Take 1 capsule (290 mcg total) by mouth daily before breakfast. 09/20/17  Yes Tat, Onalee Hua, MD  potassium chloride (K-DUR,KLOR-CON) 10 MEQ tablet Take 1 tablet (10 mEq total) by mouth daily. With furosemide Patient taking differently: Take 10 mEq by mouth daily as needed (only takes when Lasix (Furosemide)). With furosemide 05/11/17  Yes Campbell Riches, NP  QUEtiapine (SEROQUEL) 400 MG tablet Take 2 tablets (800 mg total) by mouth at bedtime. 09/07/17  Yes Eksir, Bo Mcclintock, MD  traMADol (ULTRAM) 50 MG tablet Take 50 mg by mouth every 12 (twelve) hours as needed for moderate pain or severe pain.  05/03/17  Yes [provider]  traZODone (DESYREL) 100 MG tablet Take 1 tablet (100 mg total) by mouth at bedtime. 09/07/17  Yes Eksir, Bo Mcclintock, MD    My cc time 35 minutes  Heber Charco, MD  PCCM Pager: [phone] Cell: [phone] After 3pm or if no response, call [phone]

## 2017-10-01 NOTE — Progress Notes (Signed)
Hypertonic solution instilled down the EET tube and bagged lavaged.  Tons of yellow thick secretions suctioned out.

## 2017-10-01 NOTE — Consult Note (Signed)
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 at the right base. Stable cardiomediastinal silhouette. No pneumothorax. IMPRESSION: Low lung volumes with atelectasis at the left base. Increasing airspace disease at the right base, atelectasis versus mild infiltrate. Electronically Signed   By: Jasmine Pang M.D.   On: 09/21/2017 23:28   Dg Abd Acute W/chest  Result Date: 09/27/2017 CLINICAL DATA:  Nausea and vomiting EXAM: DG ABDOMEN ACUTE W/ 1V CHEST COMPARISON:  Chest two-view 09/21/2017.  Abdomen 09/21/2017 FINDINGS: Bibasilar airspace disease unchanged. This may represent atelectasis or pneumonia. No effusion. Decreased lung volume. NG tube in the stomach. Very large amount of stool throughout the colon similar to 09/22/2014. Large amount of stool in the rectum. No bowel obstruction or free air. The colon is distended and filled with stool diffusely. IMPRESSION: Hypoventilation.  Bibasilar atelectasis/infiltrate unchanged Extremely large amount of stool throughout the colon. Colon diffusely distended similar to prior studies. Electronically Signed   By: Marlan Palau M.D.   On: 09/27/2017 07:34   [place] Ekg Site Rite  Result Date: 09/28/2017 If Site Rite image not attached,  placement could not be confirmed due to current cardiac rhythm.  Dg Or Local Abdomen  Result Date: 09/27/2017 CLINICAL DATA:  Concern for retained surgical instrument EXAM: OR LOCAL ABDOMEN COMPARISON:  None. FINDINGS: Surgical drain present with tip in right lower quadrant. Skin staples present. No retained surgical instrument evident. There is an apparent right lower quadrant ostomy. There is no bowel dilatation or air-fluid level. There is free air consistent with surgery. IMPRESSION: No retained surgical instrument. Skin staples and surgical drain present. There is pneumoperitoneum. No bowel obstruction evident. Critical Value/emergent results were called by telephone at the time of interpretation on 09/27/2017 at 1:41 pm to Sherlyn Lick, RN, who verbally acknowledged these results. Electronically Signed   By: Bretta Bang III M.D.   On: 09/27/2017 13:41   Dg Colon W/water Sol Cm  Result Date: 09/18/2017 CLINICAL DATA:  Obstipation and severe fecal impaction. Therapeutic Gastrografin enema requested. EXAM: COLON WITH WATER SOLUTION CONTRAST COMPARISON:  CT  abdomen pelvis dated September 15, 2017. FLUOROSCOPY TIME:  Radiation Exposure Index (as provided by the fluoroscopic device): 81.1 mGy. If the device does not provide the exposure index: Fluoroscopy Time:  1 minutes, 30 seconds Number of Acquired Images:  11 FINDINGS: Preliminary KUB demonstrates massive colorectal distention with stool, similar to recent CT. An enema catheter was inserted and retention balloon distended under fluoroscopy. Gastrografin was then introduced and only could be advanced to the descending colon despite repositioning the patient. Gastrografin contrast outlines the normal appearing mucosa of the rectosigmoid and descending colon. Post evacuation x-rays demonstrate significant residual stool throughout the colon. IMPRESSION: 1. Technically successful therapeutic Gastrografin enema. Contrast only reached the proximal descending  colon. Significant residual stool post evacuation. Electronically Signed   By: Obie Dredge M.D.   On: 09/18/2017 15:35    Labs:  CBC: Recent Labs    09/28/17 1739 09/29/17 0520 09/29/17 0853 09/29/17 1805 09/30/17 0627 10/01/17 0530  WBC 4.4 4.4  --   --  8.8 7.2  HGB 9.4* 8.0* 8.0* 9.4* 10.6* 8.8*  HCT 28.9* 25.6* 25.5* 29.1* 31.8* 26.4*  PLT 103* 101*  --   --  137* 115*    COAGS: Recent Labs    09/18/17 0953 09/21/17 2237 09/26/17 0626  INR 1.09 1.10 1.26  APTT 33 37*  --     BMP: Recent Labs    09/28/17 0328 09/29/17 0520 09/30/17 0854 10/01/17 0530  NA 138 137 137 140  K 4.4 4.1 3.2* 3.3*  CL 102 104 99 105  CO2 26 28 29 27   GLUCOSE 110* 95 122* 132*  BUN 15 12 5* 6  CALCIUM 8.1* 7.6* 8.3* 7.9*  CREATININE 0.98 0.63 0.53* 0.46*  GFRNONAA >60 >60 >60 >60  GFRAA >60 >60 >60 >60    LIVER FUNCTION TESTS: Recent Labs    09/21/17 2237 09/24/17 0016 09/28/17 0328 09/29/17 0520  BILITOT 0.8 0.6 0.8 0.8  AST 18 15 29 21   ALT 13 13 25 22   ALKPHOS 92 74 58 50  PROT 8.2* 6.5 5.0* 3.9*  ALBUMIN 4.3 3.6 2.6* 1.9*    TUMOR MARKERS: No results for input(s): AFPTM, CEA, CA199, CHROMGRNA in the last 8760 hours.  Assessment and Plan:  DVT. Plan for image-guided IVCF insertion tentatively for tomorrow with Dr. Grace Isaac. Consent obtained from mother over the phone. Patient is NPO. No fever and WBCs WNL. Heparin discontinued 10/01/2017. INR 1.10 seconds 09/21/2017.  Risks and benefits discussed with the patient including, but not limited to bleeding, infection, contrast induced renal failure, filter fracture or migration which can lead to emergency surgery or even death, strut penetration with damage or irritation to adjacent structures and caval thrombosis. All of the patient's questions were answered, patient is agreeable to proceed. Consent signed and in chart.   Thank you for this interesting consult.  I greatly enjoyed meeting Wenceslao L Hellwig and  look forward to participating in their care.  A copy of this report was sent to the requesting provider on this date.  Electronically Signed: Elwin Mocha, PA-C 10/01/2017, 1:41 PM   I spent a total of 20 Minutes in face to face in clinical consultation, greater than 50% of which was counseling/coordinating care for DVT.

## 2017-10-01 NOTE — Progress Notes (Signed)
PHARMACY - ADULT TOTAL PARENTERAL NUTRITION CONSULT NOTE   Pharmacy Consult for TPN Indication: massive bowel resection  Patient Measurements: Height: 5' 7.99" (172.7 cm) Weight: 168 lb 3.4 oz (76.3 kg) IBW/kg (Calculated) : 68.38  TPN AdjBW (KG): 77.8 Body mass index is 25.58 kg/m. Usual Weight: 87 kg  Assessment:  [name] admitted on 9/4 with complete colonic inertia, megacolon and severe obstructive constipation. Pt indicates poor intake and weight loss PTA over 1 month time frame. 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 Was found to have a massively distended colon from the cecum all the way to distal rectum and pelvic rim.  No evidence of perforation. Pharmacy has been consulted to start TPN for severe malnutrition related to chronic illness (cardiomyopathy, substance abuse, chronic constipation/obstruction with megacolon) associated with severe muscle and fat depletion and edema.  GI: Ogilvie's syndrome - s/p colectomy and ileostomy 9/10. NPO 8 days (since admission) with poor oral intake PTA. JP drain output down to over last 24 h. NGT output 2.65L.  LBM 9/13. TPN initiated 9/12. Wt down significantly (-32.7 lbs) since admission. Endo: hypothyroidism (TSH 6.6 and T4 0.62 on 9/1) - continue levothyroxine. CBGs at goal, 114-132 mg/dL. Insulin requirements in the past 24 hours: 1 unit SSI Lytes: K 3.3 despite replacement and increase in TPN on 9/13, Phos improved to 2.9 after replacement; Mg 1.9; Corrected Ca 9.6. Na/Cl/CO2 are within normal limits.  Renal: SCr improving 0.46, BUN improved, 6. UOP 1.7 L yesterday. I/O -3.9L (most from NGT), Net -5.1L. Pulm: procedural intubation 9/10 - extubated 9/12; re-intubated 9/13. FiO2 40% on PRVC. -9/14 Bronch d/t mucus plugging in RLL >> unable to completely remove plug with bronch lavage Cards: bilateral PE noted on admission likely provoked by lower extremity DVT in the setting of pelvic vein  compression - on heparin drip (currently on hold for increased JP drain output - plan to possibly resume 9/14). CT did not show evidence of RV strain. LVEF 55-60%. ST and BP elevated (150s/90s). Hepatobil: LFTs wnl, tbili wnl, TG 156 (on propofol - now off) Neuro: history of MDD, substance abuse disorder, ADD - Medications PTA: buspirone, citalopram, divalproex, gabapentin (agitation), quetiapine, and trazodone. Precedex at 70mcg/kg/hr + Versed at 8mg /hr and prns for sedation. CPOT 0-5. RASS -1 to 3 (goal 0).  ID: [id number] empiric zosyn to cover for possible aspiration, WBC wnl, Tmax 100.9  TPN Access: PICC triple lumen placed 9/11 TPN start date: 9/12 Nutritional Goals (per RD recommendation on 9/11): KCal: 2244 kcal  Protein: 130-160 g Fluid: > 2 L  Goal TPN rate is 90 ml/hr.  Current Nutrition: none  Plan:  Increase TPN to 70 mL/hr today with hopes to increase to goal tomorrow if electrolytes are stable. This TPN provides  102g of protein, 269g of dextrose, and 38.7g of lipids meeting 76% of patient's needs. Electrolytes in TPN: continue current K and Phos (increased amt 9/12), LK:GMWNUUV 1:1 Add MVI and trace elements to TPN Add Pepcid IV to TPN Continue sensitive SSI q6h for now and monitor.   Monitor TPN labs. Patient is at risk for refeeding.  KCl IV x4 runs today   Link Snuffer, PharmD, BCPS, BCCCP Clinical Pharmacist Clinical phone 10/01/2017 until 3:30PM - #[phone] Please refer to [facility] for [facility] Pharmacy numbers 10/01/2017       7:42 AM

## 2017-10-01 NOTE — Progress Notes (Signed)
CPT held as the patient did not tolerate this last night. Will consult with MD before continuing.

## 2017-10-01 NOTE — Plan of Care (Signed)
  Problem: Nutrition: Goal: Adequate nutrition will be maintained Outcome: Progressing - patient receiving TPN.    Problem: Coping: Goal: Level of anxiety will decrease Outcome: Progressing- patient better sedated to accommodate for greater control of anxiety while ventilated/ hospitalized.    Problem: Education: Goal: Knowledge of General Education information will improve Description Including pain rating scale, medication(s)/side effects and non-pharmacologic comfort measures Outcome: Completed/Met

## 2017-10-01 NOTE — Progress Notes (Signed)
Pt having copious amount of bright red blood out of JP drain.  Informed Dr. Magnus Ivan and Dr. Kendrick Fries.  Erick Blinks, RN

## 2017-10-01 NOTE — Progress Notes (Signed)
4 Days Post-Op   Subjective/Chief Complaint: Back on vent   Objective: Vital signs in last 24 hours: Temp:  [98.5 F (36.9 C)-100.9 F (38.3 C)] 98.8 F (37.1 C) (09/14 0725) Pulse Rate:  [85-149] 85 (09/14 0700) Resp:  [13-59] 20 (09/14 0700) BP: (99-169)/(49-116) 112/69 (09/14 0700) SpO2:  [82 %-100 %] 100 % (09/14 0715) FiO2 (%):  [40 %-100 %] 40 % (09/14 0715) Weight:  [76.3 kg] 76.3 kg (09/14 0500) Last BM Date: 09/30/17  Intake/Output from previous day: 09/13 0701 - 09/14 0700 In: 2254.2 [I.V.:1774.5; IV Piggyback:479.7] Out: 6175 [Urine:3150; Emesis/NG output:2650; Drains:300; Stool:75] Intake/Output this shift: No intake/output data recorded.  Exam: On vent Abdomen soft, wound clean, ostomy ok Drain with old blood   Lab Results:  Recent Labs    09/30/17 0627 10/01/17 0530  WBC 8.8 7.2  HGB 10.6* 8.8*  HCT 31.8* 26.4*  PLT 137* 115*   BMET Recent Labs    09/30/17 0854 10/01/17 0530  NA 137 140  K 3.2* 3.3*  CL 99 105  CO2 29 27  GLUCOSE 122* 132*  BUN 5* 6  CREATININE 0.53* 0.46*  CALCIUM 8.3* 7.9*   PT/INR No results for input(s): LABPROT, INR in the last 72 hours. ABG Recent Labs    09/30/17 1107  PHART 7.464*  HCO3 26.3    Studies/Results: Portable Chest Xray  Result Date: 10/01/2017 CLINICAL DATA:  27 year old male status post endotracheal tube placement. EXAM: PORTABLE CHEST 1 VIEW COMPARISON:  Chest x-ray a 09/30/2017. FINDINGS: There is a right upper extremity PICC with tip terminating in the right atrium. An endotracheal tube is in place with tip 3.6 cm above the carina. A nasogastric tube is seen extending into the stomach, however, the tip of the nasogastric tube extends below the lower margin of the image. Airspace consolidation in the right middle and lower lobes. Small right pleural effusion. Probable subsegmental atelectasis in the medial aspect of the left lower lobe. No definite left pleural effusion. No evidence of  pulmonary edema. Heart size is normal. Upper mediastinal contours are within normal limits. IMPRESSION: 1. Support apparatus, as above. 2. Worsening airspace consolidation in the right middle and lower lobes concerning for pneumonia. Small right parapneumonic pleural effusion. Electronically Signed   By: Trudie Reed M.D.   On: 10/01/2017 07:16   Dg Chest Port 1 View  Result Date: 09/30/2017 CLINICAL DATA:  Intubation.  Cardiomyopathy. EXAM: PORTABLE CHEST 1 VIEW COMPARISON:  09/29/2017 FINDINGS: Endotracheal tube in satisfactory position. Enteric catheter has been partially retracted and the side hole is superior to the expected location of the GE junction. Right PICC line in stable position. Cardiomediastinal silhouette is normal. Mediastinal contours appear intact. No evidence of pneumothorax. Linear bilateral airspace opacities in the lower lobes. Improved right pleural effusion. Osseous structures are without acute abnormality. Soft tissues are grossly normal. IMPRESSION: Status post intubation. Enteric catheter has been partially retracted and the side hole is superior to the expected location of the GE junction. Readjustment recommended. Bilateral linear lower lobe airspace opacities may represent atelectasis or consolidation. Improved right pleural effusion. Electronically Signed   By: Ted Mcalpine M.D.   On: 09/30/2017 11:06   Dg Chest Port 1 View  Result Date: 09/29/2017 CLINICAL DATA:  History of cardiomyopathy EXAM: PORTABLE CHEST 1 VIEW COMPARISON:  09/29/2017, 09/28/2017, 09/21/2017, CT 09/22/2017 FINDINGS: Esophageal tube tip appears folded back upon itself in the region of the stomach. Endotracheal tube is removed. Right upper extremity catheter tip overlies the  right atrium. Increased right pleural effusion and basilar airspace disease. Stable cardiomediastinal silhouette. No pneumothorax. IMPRESSION: 1. Removal of endotracheal tube. Esophageal tube tip appears folded back upon  itself in the region of the proximal stomach 2. Development of small right pleural effusion with worsened right basilar consolidation and mild volume loss/shift to the right Electronically Signed   By: Jasmine Pang M.D.   On: 09/29/2017 22:06    Anti-infectives: Anti-infectives (From admission, onward)   Start     Dose/Rate Route Frequency Ordered Stop   09/27/17 1000  piperacillin-tazobactam (ZOSYN) IVPB 3.375 g     3.375 g 12.5 mL/hr over 240 Minutes Intravenous Every 8 hours 09/27/17 0845        Assessment/Plan: s/p Procedure(s): SUBTOTAL COLECTOMY AND ILEOSTOMY (N/A)  Continuing current care Still with high NG output.  Continue NPO   LOS: 9 days    Lillyahna Hemberger A 10/01/2017

## 2017-10-01 NOTE — Progress Notes (Signed)
Patient ID: Philip Thompson, male   DOB: [birth year], 27 y.o.   MRN: [id number]   Having blood out of the JP drain again. Will give more PRBCs and FFP. Hold any heparin Check CT of the abdomen/pelvis

## 2017-10-02 ENCOUNTER — Inpatient Hospital Stay (HOSPITAL_COMMUNITY): Payer: BLUE CROSS/BLUE SHIELD

## 2017-10-02 ENCOUNTER — Encounter (HOSPITAL_COMMUNITY): Payer: Self-pay | Admitting: Interventional Radiology

## 2017-10-02 HISTORY — PX: IR IVC FILTER PLMT / S&I /IMG GUID/MOD SED: IMG701

## 2017-10-02 LAB — CBC
HCT: 32.6 % — ABNORMAL LOW (ref 39.0–52.0)
HCT: 33.3 % — ABNORMAL LOW (ref 39.0–52.0)
HCT: 32.4 % — ABNORMAL LOW (ref 39.0–52.0)
HEMATOCRIT: 32.5 % — AB (ref 39.0–52.0)
HEMOGLOBIN: 10.6 g/dL — AB (ref 13.0–17.0)
Hemoglobin: 10.9 g/dL — ABNORMAL LOW (ref 13.0–17.0)
Hemoglobin: 11.1 g/dL — ABNORMAL LOW (ref 13.0–17.0)
Hemoglobin: 10.4 g/dL — ABNORMAL LOW (ref 13.0–17.0)
MCH: 28.5 pg (ref 26.0–34.0)
MCH: 29.3 pg (ref 26.0–34.0)
MCH: 29.1 pg (ref 26.0–34.0)
MCH: 29.8 pg (ref 26.0–34.0)
MCHC: 32.5 g/dL (ref 30.0–36.0)
MCHC: 33.5 g/dL (ref 30.0–36.0)
MCHC: 33.3 g/dL (ref 30.0–36.0)
MCHC: 32.1 g/dL (ref 30.0–36.0)
MCV: 87.6 fL (ref 78.0–100.0)
MCV: 87.4 fL (ref 78.0–100.0)
MCV: 87.4 fL (ref 78.0–100.0)
MCV: 92.8 fL (ref 78.0–100.0)
PLATELETS: 125 10*3/uL — AB (ref 150–400)
Platelets: 122 10*3/uL — ABNORMAL LOW (ref 150–400)
Platelets: 131 10*3/uL — ABNORMAL LOW (ref 150–400)
Platelets: 111 10*3/uL — ABNORMAL LOW (ref 150–400)
RBC: 3.72 MIL/uL — AB (ref 4.22–5.81)
RBC: 3.72 MIL/uL — AB (ref 4.22–5.81)
RBC: 3.81 MIL/uL — ABNORMAL LOW (ref 4.22–5.81)
RBC: 3.49 MIL/uL — ABNORMAL LOW (ref 4.22–5.81)
RDW: 14.4 % (ref 11.5–15.5)
RDW: 14.6 % (ref 11.5–15.5)
RDW: 14.6 % (ref 11.5–15.5)
RDW: 15.5 % (ref 11.5–15.5)
WBC: 7.3 10*3/uL (ref 4.0–10.5)
WBC: 7.4 10*3/uL (ref 4.0–10.5)
WBC: 8.3 10*3/uL (ref 4.0–10.5)
WBC: 7.6 10*3/uL (ref 4.0–10.5)

## 2017-10-02 LAB — BPAM FFP
BLOOD PRODUCT EXPIRATION DATE: 201909192359
Blood Product Expiration Date: 201909192359
ISSUE DATE / TIME: 201909142125
ISSUE DATE / TIME: 201909141211
UNIT TYPE AND RH: 7300
Unit Type and Rh: 7300

## 2017-10-02 LAB — TYPE AND SCREEN
ABO/RH(D): B POS
Antibody Screen: NEGATIVE
Unit division: 0
Unit division: 0

## 2017-10-02 LAB — PREPARE FRESH FROZEN PLASMA: UNIT DIVISION: 0

## 2017-10-02 LAB — BASIC METABOLIC PANEL
ANION GAP: 8 (ref 5–15)
BUN: 5 mg/dL — ABNORMAL LOW (ref 6–20)
CHLORIDE: 110 mmol/L (ref 98–111)
CO2: 24 mmol/L (ref 22–32)
Calcium: 8.3 mg/dL — ABNORMAL LOW (ref 8.9–10.3)
Creatinine, Ser: 0.45 mg/dL — ABNORMAL LOW (ref 0.61–1.24)
GFR calc Af Amer: >60 mL/min (ref >60)
GFR calc non Af Amer: >60 mL/min (ref >60)
GLUCOSE: 121 mg/dL — AB (ref 70–99)
POTASSIUM: 3.8 mmol/L (ref 3.5–5.1)
Sodium: 142 mmol/L (ref 135–145)

## 2017-10-02 LAB — BPAM RBC
Blood Product Expiration Date: 201910042359
Blood Product Expiration Date: 201910042359
ISSUE DATE / TIME: 201909141541
ISSUE DATE / TIME: 201909141808
UNIT TYPE AND RH: 7300
Unit Type and Rh: 7300

## 2017-10-02 LAB — GLUCOSE, CAPILLARY
GLUCOSE-CAPILLARY: 109 mg/dL — AB (ref 70–99)
GLUCOSE-CAPILLARY: 105 mg/dL — AB (ref 70–99)
Glucose-Capillary: 122 mg/dL — ABNORMAL HIGH (ref 70–99)

## 2017-10-02 LAB — MAGNESIUM: Magnesium: 1.9 mg/dL (ref 1.7–2.4)

## 2017-10-02 LAB — PROTIME-INR
INR: 1.02
PROTHROMBIN TIME: 13.4 s (ref 11.4–15.2)

## 2017-10-02 LAB — PHOSPHORUS: PHOSPHORUS: 3 mg/dL (ref 2.5–4.6)

## 2017-10-02 MED ORDER — MIDAZOLAM HCL 2 MG/2ML IJ SOLN
INTRAMUSCULAR | Status: AC
  Filled 2017-10-02: qty 2

## 2017-10-02 MED ORDER — FENTANYL CITRATE (PF) 100 MCG/2ML IJ SOLN
INTRAMUSCULAR | Status: AC
  Filled 2017-10-02: qty 2

## 2017-10-02 MED ORDER — LIDOCAINE-EPINEPHRINE (PF) 1 %-1:200000 IJ SOLN
INTRAMUSCULAR | Status: AC
  Filled 2017-10-02: qty 30

## 2017-10-02 MED ORDER — FENTANYL CITRATE (PF) 100 MCG/2ML IJ SOLN
INTRAMUSCULAR | Status: AC | PRN
  Administered 2017-10-02: 50 ug via INTRAVENOUS

## 2017-10-02 MED ORDER — LIDOCAINE-EPINEPHRINE (PF) 1 %-1:200000 IJ SOLN
INTRAMUSCULAR | Status: AC | PRN
  Administered 2017-10-02: 10 mL

## 2017-10-02 MED ORDER — MIDAZOLAM HCL 5 MG/5ML IJ SOLN
INTRAMUSCULAR | Status: AC | PRN
  Administered 2017-10-02 (×3): 4 mg via INTRAVENOUS

## 2017-10-02 MED ORDER — TRAVASOL 10 % IV SOLN
INTRAVENOUS | Status: AC
  Administered 2017-10-02: 17:00:00 via INTRAVENOUS
  Filled 2017-10-02: qty 1317.6

## 2017-10-02 NOTE — Progress Notes (Signed)
Patient was transported to CT and back without incident  

## 2017-10-02 NOTE — Progress Notes (Signed)
CAI ANFINSON  ZOX:[id number] DOB: [birth year] DOA: 09/21/2017 PCP: [name], MD    LOS: 10 days   Reason for Consult / Chief Complaint:  Pre op acute hypoxemic respiratory failure   Consulting MD and date:  [name]  HPI/Summary of hospital stay:  27 y/o male admitted with colonic inertia/megacolon and bilateral pulmonary emboli and DVT due to pelvic vein obstruction.  He had acute aspiration pneumonia on 9/10 prior to surgery in the setting of nausea/vomiting.  He went to the OR on 9/10 for total colectomy and ileostomy.    9/14 bleeding from JP prior to starting heparin, ct negative; mucus plugging RLL  Subjective:  Still having bloody drainage from JP drain CT yesterday did not show clear sign of infarct or bowel injury to explain the bleeding cxr improved after large amount of mucus was suctioned from endotracheal tube  Objective   Blood pressure 124/80, pulse 70, temperature 98.5 F (36.9 C), temperature source Oral, resp. rate (!) 25, height 5' 7.99" (1.727 m), weight 76 kg, SpO2 97 %.    Vent Mode: PRVC FiO2 (%):  [30 %-60 %] 30 % Set Rate:  [16 bmp] 16 bmp Vt Set:  [550 mL] 550 mL PEEP:  [5 cmH20] 5 cmH20 Plateau Pressure:  [14 cmH20-21 cmH20] 21 cmH20   Intake/Output Summary (Last 24 hours) at 10/02/2017 0843 Last data filed at 10/02/2017 8119 Gross per 24 hour  Intake 4110.13 ml  Output 5480 ml  Net -1369.87 ml   Filed Weights   09/30/17 0500 10/01/17 0500 10/02/17 0500  Weight: 84.4 kg 76.3 kg 76 kg    Examination:  General:  In bed on vent HENT: NCAT ETT in place PULM: CTA B, vent supported breathing CV: RRR, no mgr GI: no bowel sounds, midline scar intact MSK: normal bulk and tone Neuro: awake, on vent, moves all four extremities, follows commands    Consults: date of consult/date signed off & final recs:   General surgery 9/6 GI 9/6 Critical care 9/10   Procedures: 9/10 - Total colectomy and ileostomy.  Was found to have a  massively distended colon from the cecum all the way to distal rectum and pelvic rim.  No evidence of perforation. Oral endotracheal tube 9/10 >> 9/12 PICC 9/11>>   Significant Diagnostic Tests: 9/5 CT chest, abdomen and pelvis: Bilateral pulmonary emboli without evidence of right heart strain.  Diffuse emphysematous changes in the lungs.  Patchy airspace disease.  Massively distended colon throughout.  Was filled with both gas and mostly stool.  There is a large amount of stool in the rectum with rectal wall thickening 9/14 CT abdomen/pelvis> subtotal colectomy and ileostomy, fluid filled dilated small bowel loops, likely post operative, though local enteritis not excluded. JP drain in RLQ; rectal wall thickening improevd, worsening right base airspace disease  Micro Data: Sputum culture 9/10 >>  Antimicrobials:  Zosyn (possible aspiration) 9/10 >>   Resolved Hospital Problem list    Assessment & Plan:   Acute respiratory failure with hypoxemia due to aspiration pneumonia and mucus plugging the RLL improved 9/15 > continue bag lavage q shift > continue hypertonic saline neb > continue full vent support > repeat CXR in AM  Thrombocytopenia: improved > monitor for bleeding > transfuse if < 50K while bleeding  Provoked DVT/PE from bowel obstruction/abdominal compartment syndrome: cannot anticoagulate as he bled from JP on 9/14 > retrievable IVC filter 9/15 > ideally would retrieve filter in 6-8 weeks if able to  tolerate anticoagulation  Hemorrhagic anemia: bleeding from JP drain but CT did not identify a clear cause; complicated by malnourished state > monitor drain output > if drain output increases and Hgb drops discuss further with surgery (re-exploration? Tagged RBC?) > Monitor for bleeding > Transfuse PRBC for Hgb < 7 gm/dL > serial CBC today > check PT/INR > no anticoagulants > consider sending Vit C level  Ogilvie/obwel obstruction s/p colectomy and ileostomy >  wound care per surgery  Protein calorie malnutrition > continue TPN  Hypokalemia >Replace electrolytes as needed   Anxiety/depression; > continue depakote  Need for sedation while on mechanical ventilation: > PAD protocol > RASS goal -1 > precedex, versed infusion > fentanyl prn (minimize continuous use with bowel inertia)    Disposition / Summary of Today's Plan 10/02/17   Remain in ICU    Feeding: TPN Analgesia: minimized Sedation: RASS goal -1, versed, precedex Thromboprophylaxis: resume heparin infusion today HOB >30 degrees Ulcer prophylaxis: famotidine Glucose control: adequate   Labs   CBC: Recent Labs  Lab 09/29/17 0520  09/30/17 0627 10/01/17 0530 10/01/17 1330 10/02/17 0225 10/02/17 0629  WBC 4.4  --  8.8 7.2 6.9 7.3 7.4  NEUTROABS 2.1  --   --  4.6  --   --   --   HGB 8.0*   < > 10.6* 8.8* 8.9* 10.6* 10.9*  HCT 25.6*   < > 31.8* 26.4* 27.7* 32.6* 32.5*  MCV 88.3  --  85.7 86.0 87.7 87.6 87.4  PLT 101*  --  137* 115* 117* 122* 131*   < > = values in this interval not displayed.   Basic Metabolic Panel: Recent Labs  Lab 09/27/17 0423  09/28/17 0328 09/29/17 0520 09/30/17 0854 10/01/17 0530 10/01/17 1330 10/02/17 0629  NA 137   < > 138 137 137 140 140 142  K 4.1   < > 4.4 4.1 3.2* 3.3* 4.6 3.8  CL 103   < > 102 104 99 105 107 110  CO2 22   < > 26 28 29 27 27 24   GLUCOSE 125*   < > 110* 95 122* 132* 136* 121*  BUN 17   < > 15 12 5* 6 6 5*  CREATININE 1.12   < > 0.98 0.63 0.53* 0.46* 0.44* 0.45*  CALCIUM 9.2   < > 8.1* 7.6* 8.3* 7.9* 8.1* 8.3*  MG 1.6*  --  1.9 1.9 1.9 1.9  --   --   PHOS 4.7*  --  3.6 2.3* 2.4* 2.9  --   --    < > = values in this interval not displayed.   GFR: Estimated Creatinine Clearance: 134.2 mL/min (A) (by C-G formula based on SCr of 0.45 mg/dL (L)). Recent Labs  Lab 10/01/17 0530 10/01/17 1330 10/02/17 0225 10/02/17 0629  WBC 7.2 6.9 7.3 7.4   Liver Function Tests: Recent Labs  Lab 09/28/17 0328  09/29/17 0520  AST 29 21  ALT 25 22  ALKPHOS 58 50  BILITOT 0.8 0.8  PROT 5.0* 3.9*  ALBUMIN 2.6* 1.9*   No results for input(s): LIPASE, AMYLASE in the last 168 hours. No results for input(s): AMMONIA in the last 168 hours. ABG    Component Value Date/Time   PHART 7.464 (H) 09/30/2017 1107   PCO2ART 36.8 09/30/2017 1107   PO2ART 60.0 (L) 09/30/2017 1107   HCO3 26.3 09/30/2017 1107   TCO2 27 09/30/2017 1107   ACIDBASEDEF 1.0 09/27/2017 1551   O2SAT 92.0 09/30/2017 1107  Coagulation Profile: Recent Labs  Lab 09/26/17 0626  INR 1.26   Cardiac Enzymes: No results for input(s): CKTOTAL, CKMB, CKMBINDEX, TROPONINI in the last 168 hours. HbA1C: Hgb A1c MFr Bld  Date/Time Value Ref Range Status  03/27/2017 06:54 AM 5.4 4.8 - 5.6 % Final    Comment:    (NOTE) Pre diabetes:          5.7%-6.4% Diabetes:              >6.4% Glycemic control for   <7.0% adults with diabetes   07/02/2016 07:34 AM 5.4 4.8 - 5.6 % Final    Comment:    (NOTE)         Pre-diabetes: 5.7 - 6.4         Diabetes: >6.4         Glycemic control for adults with diabetes: <7.0    CBG: Recent Labs  Lab 10/01/17 0743 10/01/17 1223 10/01/17 1600 10/01/17 2340 10/02/17 0654  GLUCAP 120* 125* 109* 120* 109*     Review of Systems:    Past medical history  He,  has a past medical history of ADD (attention deficit disorder), Aggression, Allergic rhinitis, Anxiety, Arthritis, Cardiomyopathy (HCC), Chronic back pain, Constipation, Depression, Fecal obstruction (HCC), Frontal head injury, Heroin overdose (HCC), Obstipation, Polysubstance abuse (HCC), and Suicide attempt (HCC).   Surgical History    Past Surgical History:  Procedure Laterality Date  . COLECTOMY N/A 09/27/2017   Procedure: SUBTOTAL COLECTOMY AND ILEOSTOMY;  Surgeon: Abigail Miyamoto, MD;  Location: [facility] OR;  Service: General;  Laterality: N/A;  . FOOT SURGERY     to get a BB out  . NO PAST SURGERIES       Social History    Social History   Socioeconomic History  . Marital status: Single    Spouse name: Not on file  . Number of children: Not on file  . Years of education: Not on file  . Highest education level: 12th grade  Occupational History  . Not on file  Social Needs  . Financial resource strain: Somewhat hard  . Food insecurity:    Worry: Never true    Inability: Never true  . Transportation needs:    Medical: No    Non-medical: No  Tobacco Use  . Smoking status: Current Every Day Smoker    Packs/day: 1.00    Years: 7.00    Pack years: 7.00    Types: Cigarettes  . Smokeless tobacco: Never Used  Substance and Sexual Activity  . Alcohol use: Not Currently    Comment: hx of. last use 01/2016  . Drug use: Yes    Types: Marijuana, Cocaine    Comment: last used marijuana 3 weeks ago. heroin last used  6 months ago.   Marland Kitchen Sexual activity: Yes    Birth control/protection: Condom  Lifestyle  . Physical activity:    Days per week: 0 days    Minutes per session: 0 min  . Stress: To some extent  Relationships  . Social connections:    Talks on phone: More than three times a week    Gets together: Twice a week    Attends religious service: Never    Active member of club or organization: No    Attends meetings of clubs or organizations: Never    Relationship status: Never married  . Intimate partner violence:    Fear of current or ex partner: No    Emotionally abused: No    Physically  abused: No    Forced sexual activity: No  Other Topics Concern  . Not on file  Social History Narrative  . Not on file  ,  reports that he has been smoking cigarettes. He has a 7.00 pack-year smoking history. He has never used smokeless tobacco. He reports that he drank alcohol. He reports that he has current or past drug history. Drugs: Marijuana and Cocaine.   Family history   His family history includes Alcohol abuse in his father; Cancer in his paternal grandfather; Cancer - Lung in his paternal  grandmother; Cancer - Ovarian in his maternal grandmother; Dementia in his father; Diabetes in his brother and other; Heart attack in his other; Heart disease in his other; Hypertension in his other.   Allergies No Known Allergies  Home meds  Prior to Admission medications   Medication Sig Start Date End Date Taking? Authorizing Provider  busPIRone (BUSPAR) 15 MG tablet Take 1 tablet (15 mg total) by mouth 3 (three) times daily. 09/07/17  Yes Eksir, Bo Mcclintock, MD  citalopram (CELEXA) 40 MG tablet Take 1 tablet (40 mg total) by mouth daily. 09/07/17 09/07/18 Yes Eksir, Bo Mcclintock, MD  cyclobenzaprine (FLEXERIL) 10 MG tablet Take 1 tablet (10 mg total) by mouth 3 (three) times daily as needed for muscle spasms. 03/29/17  Yes Armandina Stammer I, NP  divalproex (DEPAKOTE) 500 MG DR tablet Take 1 tablet (500 mg total) by mouth every 12 (twelve) hours. For mood stabilization 09/07/17  Yes Eksir, Bo Mcclintock, MD  docusate sodium (COLACE) 100 MG capsule Take 1 capsule (100 mg total) by mouth 2 (two) times daily. Patient taking differently: Take 100 mg by mouth daily.  09/19/17  Yes Tat, Onalee Hua, MD  furosemide (LASIX) 20 MG tablet Take 1 tablet (20 mg total) by mouth daily. Prn leg swelling Patient taking differently: Take 20 mg by mouth daily as needed for fluid or edema. As needed for leg swelling 05/11/17  Yes Sherie Don C, NP  gabapentin (NEURONTIN) 300 MG capsule Take 2 capsules (600 mg total) by mouth 3 (three) times daily. For agitation 09/07/17 09/07/18 Yes Eksir, Bo Mcclintock, MD  ibuprofen (ADVIL,MOTRIN) 600 MG tablet Take 1 tablet (600 mg total) by mouth every 6 (six) hours as needed for moderate pain. (May buy from over the counter): For pain 03/29/17  Yes Armandina Stammer I, NP  levothyroxine (SYNTHROID) 50 MCG tablet Take 1 tablet (50 mcg total) by mouth daily before breakfast. 09/19/17  Yes Tat, Onalee Hua, MD  linaclotide [facility]) 290 MCG CAPS capsule Take 1 capsule (290 mcg total) by mouth daily  before breakfast. 09/20/17  Yes Tat, Onalee Hua, MD  potassium chloride (K-DUR,KLOR-CON) 10 MEQ tablet Take 1 tablet (10 mEq total) by mouth daily. With furosemide Patient taking differently: Take 10 mEq by mouth daily as needed (only takes when Lasix (Furosemide)). With furosemide 05/11/17  Yes Campbell Riches, NP  QUEtiapine (SEROQUEL) 400 MG tablet Take 2 tablets (800 mg total) by mouth at bedtime. 09/07/17  Yes Eksir, Bo Mcclintock, MD  traMADol (ULTRAM) 50 MG tablet Take 50 mg by mouth every 12 (twelve) hours as needed for moderate pain or severe pain.  05/03/17  Yes [provider]  traZODone (DESYREL) 100 MG tablet Take 1 tablet (100 mg total) by mouth at bedtime. 09/07/17  Yes Eksir, Bo Mcclintock, MD    My cc time 31 minutes  Heber Dennis, MD [facility] Pager: [phone] Cell: [phone] After 3pm or if no response, call [phone]

## 2017-10-02 NOTE — Progress Notes (Signed)
5 Days Post-Op   Subjective/Chief Complaint: Remains on vent Still with moderate JP drainage and NG output   Objective: Vital signs in last 24 hours: Temp:  [97.4 F (36.3 C)-98.8 F (37.1 C)] 98.5 F (36.9 C) (09/14 2330) Pulse Rate:  [63-89] 72 (09/15 0500) Resp:  [17-38] 21 (09/15 0500) BP: (108-135)/(63-88) 135/82 (09/15 0500) SpO2:  [96 %-100 %] 100 % (09/15 0500) FiO2 (%):  [30 %-60 %] 30 % (09/15 0339) Weight:  [76 kg] 76 kg (09/15 0500) Last BM Date: 10/01/17  Intake/Output from previous day: 09/14 0701 - 09/15 0700 In: 4047.4 [I.V.:2505.2; Blood:1046; IV Piggyback:496.3] Out: 5340 [Urine:2015; Emesis/NG output:1500; Drains:1475; Stool:350] Intake/Output this shift: No intake/output data recorded.  Exam: Abdomen soft, drain with dark blood tinged fluid Midline incision clean Ostomy viable  Lab Results:  Recent Labs    10/02/17 0225 10/02/17 0629  WBC 7.3 7.4  HGB 10.6* 10.9*  HCT 32.6* 32.5*  PLT 122* 131*   BMET Recent Labs    10/01/17 1330 10/02/17 0629  NA 140 142  K 4.6 3.8  CL 107 110  CO2 27 24  GLUCOSE 136* 121*  BUN 6 5*  CREATININE 0.44* 0.45*  CALCIUM 8.1* 8.3*   PT/INR No results for input(s): LABPROT, INR in the last 72 hours. ABG Recent Labs    09/30/17 1107  PHART 7.464*  HCO3 26.3    Studies/Results: Ct Abdomen Pelvis W Contrast  Result Date: 10/01/2017 CLINICAL DATA:  Post subtotal colectomy with ileostomy. Blood out of JP drain. EXAM: CT ABDOMEN AND PELVIS WITH CONTRAST TECHNIQUE: Multidetector CT imaging of the abdomen and pelvis was performed using the standard protocol following bolus administration of intravenous contrast. CONTRAST:  OMNIPAQUE IOHEXOL 300 MG/ML  SOLN COMPARISON:  09/22/2017 and 09/15/2016 FINDINGS: Lower chest: Patchy airspace consolidation over the right lower lobe which may be due to atelectasis or infection. Small right pleural effusion. Linear density over the left lower lobe likely  atelectasis. Mild patchy peripheral opacification over the lingula and left lower lobe which may be due to atelectasis or infection. Hepatobiliary: Gallbladder somewhat contracted. Liver and biliary tree are within normal. Pancreas: Normal. Spleen: Normal. Adrenals/Urinary Tract: Adrenal glands are normal. Kidneys are normal in size without hydronephrosis or nephrolithiasis. Foley catheter is present within the bladder which is somewhat decompressed. Stomach/Bowel: Nasogastric tube has tip just below the gastroesophageal junction. Border of the stomach is difficult to accurately identified. Patient has undergone interval subtotal colectomy with ileostomy. Ileostomy site over the right lower quadrant is unremarkable. There are several fluid-filled mildly dilated small bowel loops over the upper abdomen likely postoperative ileus. Several small bowel loops have mild wall thickening and mucosal enhancement likely postoperative although can be seen due to regional enteritis of infectious or inflammatory nature. No definite free peritoneal air. Is difficult to follow the course of the remaining colon. There is moderate fecal retention over the rectum with mild rectal wall thickening with significant improvement compared to the previous exam. Vascular/Lymphatic: Within normal. Reproductive: Within normal. Other: No definite intra-abdominal abscess. Mild diffuse subcutaneous edema over the lower abdomen/pelvis. Skin staples vertically over the midline abdomen. There is a percutaneous drain entering the left anterior lower abdomen with tip over the right lower quadrant. Musculoskeletal: Unchanged. IMPRESSION: Evidence of patient's recent subtotal colectomy and ileostomy. Ileostomy site is unremarkable. Several fluid-filled mildly dilated small bowel loops over the upper abdomen with mucosal enhancement and mild wall thickening. Findings are likely postoperative, although regional enteritis of infectious or inflammatory  nature could produce similar findings. Percutaneous drain with tip over the right lower quadrant. Persistent mild rectal wall thickening with significantly improved fecal retention over the rectum. Worsening patchy bilateral airspace opacification within the lung bases right worse than left which may be due to atelectasis or infection. Small right pleural effusion. Enteric tube with tip just below the gastroesophageal junction. Electronically Signed   By: Elberta Fortis M.D.   On: 10/01/2017 16:02   Portable Chest Xray  Result Date: 10/01/2017 CLINICAL DATA:  27 year old male status post endotracheal tube placement. EXAM: PORTABLE CHEST 1 VIEW COMPARISON:  Chest x-ray a 09/30/2017. FINDINGS: There is a right upper extremity PICC with tip terminating in the right atrium. An endotracheal tube is in place with tip 3.6 cm above the carina. A nasogastric tube is seen extending into the stomach, however, the tip of the nasogastric tube extends below the lower margin of the image. Airspace consolidation in the right middle and lower lobes. Small right pleural effusion. Probable subsegmental atelectasis in the medial aspect of the left lower lobe. No definite left pleural effusion. No evidence of pulmonary edema. Heart size is normal. Upper mediastinal contours are within normal limits. IMPRESSION: 1. Support apparatus, as above. 2. Worsening airspace consolidation in the right middle and lower lobes concerning for pneumonia. Small right parapneumonic pleural effusion. Electronically Signed   By: Trudie Reed M.D.   On: 10/01/2017 07:16   Dg Chest Port 1 View  Result Date: 09/30/2017 CLINICAL DATA:  Intubation.  Cardiomyopathy. EXAM: PORTABLE CHEST 1 VIEW COMPARISON:  09/29/2017 FINDINGS: Endotracheal tube in satisfactory position. Enteric catheter has been partially retracted and the side hole is superior to the expected location of the GE junction. Right PICC line in stable position. Cardiomediastinal  silhouette is normal. Mediastinal contours appear intact. No evidence of pneumothorax. Linear bilateral airspace opacities in the lower lobes. Improved right pleural effusion. Osseous structures are without acute abnormality. Soft tissues are grossly normal. IMPRESSION: Status post intubation. Enteric catheter has been partially retracted and the side hole is superior to the expected location of the GE junction. Readjustment recommended. Bilateral linear lower lobe airspace opacities may represent atelectasis or consolidation. Improved right pleural effusion. Electronically Signed   By: Ted Mcalpine M.D.   On: 09/30/2017 11:06    Anti-infectives: Anti-infectives (From admission, onward)   Start     Dose/Rate Route Frequency Ordered Stop   09/27/17 1000  piperacillin-tazobactam (ZOSYN) IVPB 3.375 g     3.375 g 12.5 mL/hr over 240 Minutes Intravenous Every 8 hours 09/27/17 0845        Assessment/Plan: s/p Procedure(s): SUBTOTAL COLECTOMY AND ILEOSTOMY (N/A)  H/H stable this morning CT scan showed no gross extrav of contrast to suggest venous bleeding and there was no large fluid collection or hematoma either.  Will continue to monitor. NG output still high and still has moderately dilated small bowel so would hold on tube feeds still.  LOS: 10 days    Chauncey Bruno A 10/02/2017

## 2017-10-02 NOTE — Progress Notes (Signed)
PHARMACY - ADULT TOTAL PARENTERAL NUTRITION CONSULT NOTE   Pharmacy Consult for TPN Indication: massive bowel resection  Patient Measurements: Height: 5' 7.99" (172.7 cm) Weight: 167 lb 8.8 oz (76 kg) IBW/kg (Calculated) : 68.38  TPN AdjBW (KG): 77.8 Body mass index is 25.48 kg/m. Usual Weight: 87 kg  Assessment:  [name] admitted on 9/4 with complete colonic inertia, megacolon and severe obstructive constipation. Pt indicates poor intake and weight loss PTA over 1 month time frame. 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 Was found to have a massively distended colon from the cecum all the way to distal rectum and pelvic rim.  No evidence of perforation. Pharmacy has been consulted to start TPN for severe malnutrition related to chronic illness (cardiomyopathy, substance abuse, chronic constipation/obstruction with megacolon) associated with severe muscle and fat depletion and edema.  GI: Ogilvie's syndrome - s/p colectomy and ileostomy 9/10. NPO 8 days (since admission) with poor oral intake PTA. JP drain output up to 1475 ml over last 24 h. NGT output 1.5L.  LBM 9/14. TPN initiated 9/12. Wt down significantly (-32.7 lbs) since admission. CT abd neg 9/14 for new acute process Endo: hypothyroidism (TSH 6.6 and T4 0.62 on 9/1) - continue levothyroxine. CBGs at goal. Insulin requirements in the past 24 hours: 0 unit SSI Lytes: WNL Renal: SCr stable 0.45, 2 L UOP x 24 hr Pulm: procedural intubation 9/10 - extubated 9/12; re-intubated 9/13. FiO2 30% on PRVC. -9/14 Bronch d/t mucus plugging in RLL >> unable to completely remove plug with bronch lavage Cards: bilateral PE noted on admission likely provoked by lower extremity DVT in the setting of pelvic vein compression - on heparin drip (currently on hold for increased JP drain output which is bloody - pt to get IVC filter 9/15 d/t inability to resume heparin). CT did not show evidence of RV strain. LVEF  55-60%. NSR. BP improved.  Hepatobil: LFTs wnl, tbili wnl, TG 156 (on propofol - now off) Neuro: history of MDD, substance abuse disorder, ADD - Medications PTA: buspirone, citalopram, divalproex, gabapentin (agitation), quetiapine, and trazodone. Precedex at 40mcg/kg/hr + Versed at 8mg /hr and prns for sedation. Avoiding opiates with Ogilvie's syndrome. CPOT 0-5. RASS 2 (goal 0).  ID: [email or web address] to cover for possible aspiration, WBC wnl, afebrile  TPN Access: PICC triple lumen placed 9/11 TPN start date: 9/12 Nutritional Goals (per RD recommendation on 9/11): KCal: 2244 kcal  Protein: 130-160 g Fluid: > 2 L  Goal TPN rate is 90 ml/hr.  Current Nutrition: none  Plan:  Increase TPN to 90 mL/hr today (goal rate) This TPN provides  131 g of protein, 345 g of dextrose, and 49.8 g of lipids meeting 100% of patient's needs. Electrolytes in TPN: continue current K and Phos (increased amt 9/12), YQ:MGNOIBB 1:1 Add MVI and trace elements to TPN Add Vit C 500 mg d/t concerns for severe deficiency Add Pepcid IV to TPN  Continue sensitive SSI q6h for now and monitor.   Monitor TPN labs. Patient is at risk for refeeding.  Isaac Bliss, PharmD, BCPS, BCCCP Clinical Pharmacist [phone]  Please check AMION for all [facility] Pharmacy numbers  10/02/2017 10:43 AM

## 2017-10-02 NOTE — Progress Notes (Signed)
The patient was transported to CT and back without incident.

## 2017-10-03 LAB — GLUCOSE, CAPILLARY
GLUCOSE-CAPILLARY: 112 mg/dL — AB (ref 70–99)
GLUCOSE-CAPILLARY: 113 mg/dL — AB (ref 70–99)
GLUCOSE-CAPILLARY: 118 mg/dL — AB (ref 70–99)
Glucose-Capillary: 100 mg/dL — ABNORMAL HIGH (ref 70–99)
Glucose-Capillary: 104 mg/dL — ABNORMAL HIGH (ref 70–99)
Glucose-Capillary: 114 mg/dL — ABNORMAL HIGH (ref 70–99)
Glucose-Capillary: 115 mg/dL — ABNORMAL HIGH (ref 70–99)

## 2017-10-03 LAB — PHOSPHORUS: PHOSPHORUS: 3.8 mg/dL (ref 2.5–4.6)

## 2017-10-03 LAB — CBC
HCT: 32 % — ABNORMAL LOW (ref 39.0–52.0)
Hemoglobin: 10.4 g/dL — ABNORMAL LOW (ref 13.0–17.0)
MCH: 29.7 pg (ref 26.0–34.0)
MCHC: 32.5 g/dL (ref 30.0–36.0)
MCV: 91.4 fL (ref 78.0–100.0)
Platelets: 126 10*3/uL — ABNORMAL LOW (ref 150–400)
RBC: 3.5 MIL/uL — AB (ref 4.22–5.81)
RDW: 15.4 % (ref 11.5–15.5)
WBC: 7.6 10*3/uL (ref 4.0–10.5)

## 2017-10-03 LAB — COMPREHENSIVE METABOLIC PANEL
ALT: 19 U/L (ref 0–44)
ANION GAP: 7 (ref 5–15)
AST: 15 U/L (ref 15–41)
Albumin: 1.9 g/dL — ABNORMAL LOW (ref 3.5–5.0)
Alkaline Phosphatase: 75 U/L (ref 38–126)
BUN: 7 mg/dL (ref 6–20)
CO2: 24 mmol/L (ref 22–32)
Calcium: 8.2 mg/dL — ABNORMAL LOW (ref 8.9–10.3)
Chloride: 108 mmol/L (ref 98–111)
Creatinine, Ser: 0.42 mg/dL — ABNORMAL LOW (ref 0.61–1.24)
Glucose, Bld: 118 mg/dL — ABNORMAL HIGH (ref 70–99)
Potassium: 3.8 mmol/L (ref 3.5–5.1)
SODIUM: 139 mmol/L (ref 135–145)
Total Bilirubin: 0.3 mg/dL (ref 0.3–1.2)
Total Protein: 5 g/dL — ABNORMAL LOW (ref 6.5–8.1)

## 2017-10-03 LAB — DIFFERENTIAL
ABS IMMATURE GRANULOCYTES: 0.3 10*3/uL — AB (ref 0.0–0.1)
Basophils Absolute: 0 10*3/uL (ref 0.0–0.1)
Basophils Relative: 0 %
Eosinophils Absolute: 0.1 10*3/uL (ref 0.0–0.7)
Eosinophils Relative: 2 %
IMMATURE GRANULOCYTES: 3 %
LYMPHS ABS: 0.9 10*3/uL (ref 0.7–4.0)
Lymphocytes Relative: 12 %
Monocytes Absolute: 1 10*3/uL (ref 0.1–1.0)
Monocytes Relative: 13 %
Neutro Abs: 5.4 10*3/uL (ref 1.7–7.7)
Neutrophils Relative %: 70 %

## 2017-10-03 LAB — TRIGLYCERIDES
TRIGLYCERIDES: 51 mg/dL (ref <150)
Triglycerides: 284 mg/dL — ABNORMAL HIGH (ref <150)

## 2017-10-03 LAB — PREALBUMIN: PREALBUMIN: 13.3 mg/dL — AB (ref 18–38)

## 2017-10-03 LAB — MAGNESIUM: Magnesium: 1.9 mg/dL (ref 1.7–2.4)

## 2017-10-03 MED ORDER — DEXMEDETOMIDINE HCL IN NACL 400 MCG/100ML IV SOLN
0.0000 ug/kg/h | INTRAVENOUS | Status: DC
  Administered 2017-10-03 – 2017-10-06 (×24): 2 ug/kg/h via INTRAVENOUS
  Filled 2017-10-03 (×8): qty 100
  Filled 2017-10-03: qty 200
  Filled 2017-10-03 (×16): qty 100

## 2017-10-03 MED ORDER — CLONAZEPAM 1 MG PO TABS
1.0000 mg | ORAL_TABLET | Freq: Two times a day (BID) | ORAL | Status: DC
  Administered 2017-10-03 – 2017-10-05 (×6): 1 mg
  Filled 2017-10-03 (×6): qty 1

## 2017-10-03 MED ORDER — BUSPIRONE HCL 5 MG PO TABS
5.0000 mg | ORAL_TABLET | Freq: Three times a day (TID) | ORAL | Status: DC
  Administered 2017-10-03 – 2017-10-06 (×12): 5 mg
  Filled 2017-10-03 (×12): qty 1

## 2017-10-03 MED ORDER — QUETIAPINE FUMARATE 50 MG PO TABS
50.0000 mg | ORAL_TABLET | Freq: Every day | ORAL | Status: DC
  Administered 2017-10-03: 50 mg
  Filled 2017-10-03: qty 1

## 2017-10-03 MED ORDER — CLONAZEPAM 1 MG PO TABS: 1.0000 mg | ORAL_TABLET | Freq: Two times a day (BID) | ORAL | Status: DC

## 2017-10-03 MED ORDER — TRAVASOL 10 % IV SOLN
INTRAVENOUS | Status: AC
  Administered 2017-10-03: 17:00:00 via INTRAVENOUS
  Filled 2017-10-03: qty 1317.6

## 2017-10-03 MED ORDER — VITAL AF 1.2 CAL PO LIQD
1000.0000 mL | ORAL | Status: DC
  Administered 2017-10-03 – 2017-10-05 (×3): 1000 mL
  Filled 2017-10-03: qty 1000

## 2017-10-03 MED ORDER — VITAL HIGH PROTEIN PO LIQD: 1000.0000 mL | ORAL | Status: DC

## 2017-10-03 NOTE — Progress Notes (Signed)
6 Days Post-Op   Subjective/Chief Complaint: On vent awake following commands   Objective: Vital signs in last 24 hours: Temp:  [97.6 F (36.4 C)-98.8 F (37.1 C)] 97.6 F (36.4 C) (09/16 0720) Pulse Rate:  [67-96] 85 (09/16 0600) Resp:  [16-30] 24 (09/16 0600) BP: (107-135)/(65-89) 113/72 (09/16 0600) SpO2:  [95 %-100 %] 97 % (09/16 0600) FiO2 (%):  [30 %-100 %] 30 % (09/16 0315) Weight:  [77.3 kg] 77.3 kg (09/16 0431) Last BM Date: 10/01/17  Intake/Output from previous day: 09/15 0701 - 09/16 0700 In: 3192.4 [I.V.:2989.3; IV Piggyback:203] Out: 5080 [Urine:2100; Emesis/NG output:2000; Drains:730; Stool:250] Intake/Output this shift: No intake/output data recorded.  Incision/Wound:CDI ostomy viable bag empty soft ND   Lab Results:  Recent Labs    10/02/17 2018 10/03/17 0329  WBC 7.6 7.6  HGB 10.4* 10.4*  HCT 32.4* 32.0*  PLT 111* 126*   BMET Recent Labs    10/02/17 0629 10/03/17 0600  NA 142 139  K 3.8 3.8  CL 110 108  CO2 24 24  GLUCOSE 121* 118*  BUN 5* 7  CREATININE 0.45* 0.42*  CALCIUM 8.3* 8.2*   PT/INR Recent Labs    10/02/17 1030  LABPROT 13.4  INR 1.02   ABG Recent Labs    09/30/17 1107  PHART 7.464*  HCO3 26.3    Studies/Results: Ct Abdomen Pelvis W Contrast  Result Date: 10/01/2017 CLINICAL DATA:  Post subtotal colectomy with ileostomy. Blood out of JP drain. EXAM: CT ABDOMEN AND PELVIS WITH CONTRAST TECHNIQUE: Multidetector CT imaging of the abdomen and pelvis was performed using the standard protocol following bolus administration of intravenous contrast. CONTRAST:  OMNIPAQUE IOHEXOL 300 MG/ML  SOLN COMPARISON:  09/22/2017 and 09/15/2016 FINDINGS: Lower chest: Patchy airspace consolidation over the right lower lobe which may be due to atelectasis or infection. Small right pleural effusion. Linear density over the left lower lobe likely atelectasis. Mild patchy peripheral opacification over the lingula and left lower lobe which  may be due to atelectasis or infection. Hepatobiliary: Gallbladder somewhat contracted. Liver and biliary tree are within normal. Pancreas: Normal. Spleen: Normal. Adrenals/Urinary Tract: Adrenal glands are normal. Kidneys are normal in size without hydronephrosis or nephrolithiasis. Foley catheter is present within the bladder which is somewhat decompressed. Stomach/Bowel: Nasogastric tube has tip just below the gastroesophageal junction. Border of the stomach is difficult to accurately identified. Patient has undergone interval subtotal colectomy with ileostomy. Ileostomy site over the right lower quadrant is unremarkable. There are several fluid-filled mildly dilated small bowel loops over the upper abdomen likely postoperative ileus. Several small bowel loops have mild wall thickening and mucosal enhancement likely postoperative although can be seen due to regional enteritis of infectious or inflammatory nature. No definite free peritoneal air. Is difficult to follow the course of the remaining colon. There is moderate fecal retention over the rectum with mild rectal wall thickening with significant improvement compared to the previous exam. Vascular/Lymphatic: Within normal. Reproductive: Within normal. Other: No definite intra-abdominal abscess. Mild diffuse subcutaneous edema over the lower abdomen/pelvis. Skin staples vertically over the midline abdomen. There is a percutaneous drain entering the left anterior lower abdomen with tip over the right lower quadrant. Musculoskeletal: Unchanged. IMPRESSION: Evidence of patient's recent subtotal colectomy and ileostomy. Ileostomy site is unremarkable. Several fluid-filled mildly dilated small bowel loops over the upper abdomen with mucosal enhancement and mild wall thickening. Findings are likely postoperative, although regional enteritis of infectious or inflammatory nature could produce similar findings. Percutaneous drain with tip  over the right lower quadrant.  Persistent mild rectal wall thickening with significantly improved fecal retention over the rectum. Worsening patchy bilateral airspace opacification within the lung bases right worse than left which may be due to atelectasis or infection. Small right pleural effusion. Enteric tube with tip just below the gastroesophageal junction. Electronically Signed   By: Elberta Fortis M.D.   On: 10/01/2017 16:02   Ir Ivc Filter Plmt / S&i /img Guid/mod Sed  Result Date: 10/02/2017 INDICATION: History of pulmonary embolism, initiated on anti coagulation, now with uncontrolled bleeding associated with a surgical drain. As such, request made for placement an IVC filter for caval interruption purposes. Given the patient's multiple medical comorbidities, the filter will be considered a permanent device and the patient will not be actively followed by the interventional radiology service for retrieval. EXAM: ULTRASOUND GUIDANCE FOR VASCULAR ACCESS IVC CATHETERIZATION AND VENOGRAM IVC FILTER INSERTION COMPARISON:  CT abdomen and pelvis - 09/21/2017; chest CT - 09/22/2017 MEDICATIONS: None. ANESTHESIA/SEDATION: Fentanyl 100 mcg IV; Versed 12 mg IV Sedation Time: 12 minutes; The patient was continuously monitored during the procedure by the interventional radiology nurse under my direct supervision. CONTRAST:  40 cc Isovue-300 FLUOROSCOPY TIME:  1 minute 6 seconds (46.6 mGy) COMPLICATIONS: None immediate PROCEDURE: Informed consent was obtained from the patient's family following explanation of the procedure, risks, benefits and alternatives. The patient understands, agrees and consents for the procedure. All questions were addressed. A time out was performed prior to the initiation of the procedure. Maximal barrier sterile technique utilized including caps, mask, sterile gowns, sterile gloves, large sterile drape, hand hygiene, and Betadine prep. Under sterile condition and local anesthesia, right internal jugular venous access was  performed with ultrasound. An ultrasound image was saved and sent to PACS. Over a guidewire, the IVC filter delivery sheath and inner dilator were advanced into the IVC just above the IVC bifurcation. Contrast injection was performed for an IVC venogram. Through the delivery sheath, a retrievable Denali IVC filter was deployed below the level of the renal veins and above the IVC bifurcation. Limited post deployment venacavagram was performed. The delivery sheath was removed and hemostasis was obtained with manual compression. A dressing was placed. The patient tolerated the procedure well without immediate post procedural complication. FINDINGS: The IVC is patent. No evidence of thrombus, stenosis, or occlusion. No variant venous anatomy. Successful placement of the IVC filter below the level of the renal veins. IMPRESSION: Successful ultrasound and fluoroscopically guided placement of an infrarenal retrievable IVC filter via right jugular approach. PLAN: Due to patient related comorbidities and/or clinical necessity, this IVC filter should be considered a permanent device. This patient will not be actively followed for future filter retrieval. Electronically Signed   By: Simonne Come M.D.   On: 10/02/2017 13:16   Dg Chest Port 1 View  Result Date: 10/02/2017 CLINICAL DATA:  Acute respiratory failure with hypoxia EXAM: PORTABLE CHEST 1 VIEW COMPARISON:  Chest radiograph dated 10/01/2017. CTA chest dated 09/22/2017. FINDINGS: Right pleural effusion has improved/resolved. Focal patchy opacity in the lateral right lower lung, corresponding to the inferior right upper lobe on CT, where this appearance may suggest sequela of pulmonary infarct. Left lung is clear. No frank interstitial edema. No pneumothorax. The heart is normal in size. Endotracheal tube terminates 4.5 cm above the carina. Right arm PICC terminates in the upper right atrium. Enteric tube courses into the gastric cardia with its side port at the GE  junction. IMPRESSION: Endotracheal tube terminates 4.5 cm  above the carina. Additional support apparatus as above. Right pleural effusion is improved/resolved. Focal patchy opacity in the lateral right lower lung, corresponding to the inferior right upper lobe on CT, where this appearance may suggest sequela of pulmonary infarct. Follow-up is suggested to document resolution. Electronically Signed   By: Charline Bills M.D.   On: 10/02/2017 07:42    Anti-infectives: Anti-infectives (From admission, onward)   Start     Dose/Rate Route Frequency Ordered Stop   09/27/17 1000  piperacillin-tazobactam (ZOSYN) IVPB 3.375 g     3.375 g 12.5 mL/hr over 240 Minutes Intravenous Every 8 hours 09/27/17 0845        Assessment/Plan: s/p Procedure(s): SUBTOTAL COLECTOMY AND ILEOSTOMY (N/A) STABLE OVERALL H/H stable  Less out JP  Hopefully extubate per CCM  CONT TNA   LOS: 11 days    Philip Thompson Fus A Philip Thompson 10/03/2017

## 2017-10-03 NOTE — Progress Notes (Signed)
eLink Physician-Brief Progress Note Patient Name: Philip Thompson DOB: 14-Feb-1990 MRN: [id number]   Date of Service  10/03/2017  HPI/Events of Note  Agitation - Request to renew restraint orders.   eICU Interventions  Will renew restraint orders.     Intervention Category Minor Interventions: Agitation / anxiety - evaluation and management  [name] 10/03/2017, 9:05 PM

## 2017-10-03 NOTE — Progress Notes (Signed)
Philip Thompson  MWU:[id number] DOB: 08-10-1990 DOA: 09/21/2017 PCP: [name], MD    LOS: 11 days   Reason for Consult / Chief Complaint:  Pre op acute hypoxemic respiratory failure   Consulting MD and date:  [name]  HPI/Summary of hospital stay:  27 y/o male admitted with colonic inertia/megacolon and bilateral pulmonary emboli and DVT due to pelvic vein obstruction.  He had acute aspiration pneumonia on 9/10 prior to surgery in the setting of nausea/vomiting.  He went to the OR on 9/10 for total colectomy and ileostomy.    9/12 extubated 9/13 re-intubated 9/14 bleeding from JP prior to starting heparin, ct negative; mucus plugging RLL 9/16 continued bloody drainage from JP, mucous plugs requiring hypertonic nebs and bag lavage  Subjective:  Weaned on 10/5 but had significant tachypnea with RR in 50's. Switched back to full support.  Klonopin, Seroquel (lowered from home dose), buspar (lowered from home dose) added to sedation regimen (avoiding narcotics due to slow bowel inertia).  Objective   Blood pressure 111/72, pulse 82, temperature 97.6 F (36.4 C), temperature source Axillary, resp. rate (!) 48, height 5' 7.99" (1.727 m), weight 77.3 kg, SpO2 97 %.    Vent Mode: PSV;CPAP FiO2 (%):  [30 %-100 %] 30 % Set Rate:  [16 bmp] 16 bmp Vt Set:  [550 mL] 550 mL PEEP:  [5 cmH20] 5 cmH20 Pressure Support:  [10 cmH20] 10 cmH20 Plateau Pressure:  [15 cmH20-23 cmH20] 16 cmH20   Intake/Output Summary (Last 24 hours) at 10/03/2017 1038 Last data filed at 10/03/2017 0800 Gross per 24 hour  Intake 3124.66 ml  Output 4165 ml  Net -1040.34 ml   Filed Weights   10/01/17 0500 10/02/17 0500 10/03/17 0431  Weight: 76.3 kg 76 kg 77.3 kg    Examination: General:  Adult male, in bed on vent, anxious HENT: NCAT ETT in place PULM: CTA B, vent supported breathing CV: RRR, no mgr GI: no bowel sounds, midline scar intact MSK: normal bulk and tone Neuro: awake, on vent, moves  all four extremities, follows commands   Consults: date of consult/date signed off & final recs:  General surgery 9/6 GI 9/6 Critical care 9/10   Procedures: 9/10 - Total colectomy and ileostomy.  Was found to have a massively distended colon from the cecum all the way to distal rectum and pelvic rim.  No evidence of perforation. Oral endotracheal tube 9/10 >> 9/12.  9/13 >  PICC 9/11>>   Significant Diagnostic Tests: 9/5 CT chest, abdomen and pelvis: Bilateral PE w/o RHS, diffuse emphysema, massively distended colon and rectum filled with gas and stool. 9/14 CT abdomen/pelvis> subtotal colectomy and ileostomy, fluid filled dilated small bowel loops, likely post operative, though local enteritis not excluded. JP drain in RLQ; rectal wall thickening improevd, worsening right base airspace disease.  Micro Data: Sputum culture 9/10 >>  Antimicrobials:  Zosyn (possible aspiration) 9/10 > (stop date 9/19 for 10 days total)   Assessment & Plan:   Acute respiratory failure with hypoxemia due to aspiration pneumonia and mucus plugging the RLL improved 9/15. > back on full support now given tachypnea. > re-attempt wean in AM, if does well can consider extubation (mentally he is appropriate today; however, given tachypnea will hold off). > continue hypertonic saline neb, bag lavage q shift, chest PT.  > continue full vent support. > follow CXR.  Thrombocytopenia: improved. > monitor for bleeding. > transfuse if < 50K while bleeding.  Provoked DVT/PE from  bowel obstruction/abdominal compartment syndrome: cannot anticoagulate as he bled from JP on 9/14.  S/p retrievable filter 9/15. > ideally would retrieve filter in 6-8 weeks if able to tolerate anticoagulation.  Hemorrhagic anemia: bleeding from JP drain but CT did not identify a clear cause; complicated by malnourished state > monitor drain output. > if drain output increases and Hgb drops discuss further with surgery  (re-exploration? Tagged RBC?). > Monitor for bleeding. > Transfuse PRBC for Hgb < 7 gm/dL. > follow coags. > no anticoagulants. > consider sending Vit C level.  Ogilvie/obwel obstruction s/p colectomy and ileostomy. > wound care per surgery.  Protein calorie malnutrition. > continue TPN.  Anxiety/depression. > continue depakote.  Need for sedation while on mechanical ventilation. > PAD protocol. > RASS goal -1. > versed infusion with fentanyl prn (minimize continuous use with bowel inertia). > added seroquel (lowered from home regimen), klonopin, buspar (lowered from home regimen)   Disposition / Summary of Today's Plan 10/03/17   Remain in ICU    Feeding: TPN Analgesia: minimized Sedation: RASS goal -1, versed, precedex, seroquel (lowered from home regimen), klonopin, buspar (lowered from home regimen) Thromboprophylaxis: holding heparin HOB >30 degrees Ulcer prophylaxis: famotidine Glucose control: adequate   Labs   CBC: Recent Labs  Lab 09/29/17 0520  10/01/17 0530  10/02/17 0225 10/02/17 0629 10/02/17 1030 10/02/17 2018 10/03/17 0329  WBC 4.4   < > 7.2   < > 7.3 7.4 8.3 7.6 7.6  NEUTROABS 2.1  --  4.6  --   --   --   --   --  5.4  HGB 8.0*   < > 8.8*   < > 10.6* 10.9* 11.1* 10.4* 10.4*  HCT 25.6*   < > 26.4*   < > 32.6* 32.5* 33.3* 32.4* 32.0*  MCV 88.3   < > 86.0   < > 87.6 87.4 87.4 92.8 91.4  PLT 101*   < > 115*   < > 122* 131* 125* 111* 126*   < > = values in this interval not displayed.   Basic Metabolic Panel: Recent Labs  Lab 09/29/17 0520 09/30/17 0854 10/01/17 0530 10/01/17 1330 10/02/17 0629 10/03/17 0600  NA 137 137 140 140 142 139  K 4.1 3.2* 3.3* 4.6 3.8 3.8  CL 104 99 105 107 110 108  CO2 28 29 27 27 24 24   GLUCOSE 95 122* 132* 136* 121* 118*  BUN 12 5* 6 6 5* 7  CREATININE 0.63 0.53* 0.46* 0.44* 0.45* 0.42*  CALCIUM 7.6* 8.3* 7.9* 8.1* 8.3* 8.2*  MG 1.9 1.9 1.9  --  1.9 1.9  PHOS 2.3* 2.4* 2.9  --  3.0 3.8   GFR: Estimated  Creatinine Clearance: 134.2 mL/min (A) (by C-G formula based on SCr of 0.42 mg/dL (L)). Recent Labs  Lab 10/02/17 0629 10/02/17 1030 10/02/17 2018 10/03/17 0329  WBC 7.4 8.3 7.6 7.6   Liver Function Tests: Recent Labs  Lab 09/28/17 0328 09/29/17 0520 10/03/17 0600  AST 29 21 15   ALT 25 22 19   ALKPHOS 58 50 75  BILITOT 0.8 0.8 0.3  PROT 5.0* 3.9* 5.0*  ALBUMIN 2.6* 1.9* 1.9*   No results for input(s): LIPASE, AMYLASE in the last 168 hours. No results for input(s): AMMONIA in the last 168 hours. ABG    Component Value Date/Time   PHART 7.464 (H) 09/30/2017 1107   PCO2ART 36.8 09/30/2017 1107   PO2ART 60.0 (L) 09/30/2017 1107   HCO3 26.3 09/30/2017 1107   TCO2  27 09/30/2017 1107   ACIDBASEDEF 1.0 09/27/2017 1551   O2SAT 92.0 09/30/2017 1107    Coagulation Profile: Recent Labs  Lab 10/02/17 1030  INR 1.02   Cardiac Enzymes: No results for input(s): CKTOTAL, CKMB, CKMBINDEX, TROPONINI in the last 168 hours. HbA1C: Hgb A1c MFr Bld  Date/Time Value Ref Range Status  03/27/2017 06:54 AM 5.4 4.8 - 5.6 % Final    Comment:    (NOTE) Pre diabetes:          5.7%-6.4% Diabetes:              >6.4% Glycemic control for   <7.0% adults with diabetes   07/02/2016 07:34 AM 5.4 4.8 - 5.6 % Final    Comment:    (NOTE)         Pre-diabetes: 5.7 - 6.4         Diabetes: >6.4         Glycemic control for adults with diabetes: <7.0    CBG: Recent Labs  Lab 10/02/17 0654 10/02/17 1207 10/02/17 1821 10/03/17 0003 10/03/17 0621  GLUCAP 109* 105* 122* 104* 100*     CC time: 35 min.   Rutherford Guys, [place] [facility] Pager: [phone]  or [phone] 10/03/2017, 11:03 AM

## 2017-10-03 NOTE — Progress Notes (Signed)
Nutrition Follow-up  DOCUMENTATION CODES:   Severe malnutrition in context of chronic illness  INTERVENTION:   Continue TPN per Pharmacy; goal to meet >90% of estimated needs  If remains intubated, recommend trial of trickle TF of Vital AF 1.2 at 20 ml/hr  NUTRITION DIAGNOSIS:   Severe Malnutrition related to chronic illness(cardiomyopathy, substance abuse, chronic constipation/obstipation with megacolon) as evidenced by severe muscle depletion, severe fat depletion, edema.  Being addressed via TPN  GOAL:   Patient will meet greater than or equal to 90% of their needs  Met  MONITOR:   Diet advancement, Labs, Weight trends, Skin, I & O's(TPN)  REASON FOR ASSESSMENT:   Ventilator    ASSESSMENT:   27 yo male admitted 9/4 for megacolon and severe obstructive constipation with severely dilated colon and colitis. Noted pt admitted to [facility] at the end of August with constipation x 3 weeks with megacolon with medical therapy attempts, discharged on 9/2. Pt also with chest pain with bilateral PE on [place] includes polysubstance abuse, drug-induced cardiomyopathy, anxiety, chronic obstipation/constipation  9/10 subtotal colectomy with ileostomy 9/11 PICC line Placed 9/12 Extubated, TPN started 9/13 Re-Intubated 9/14 bleeding from JP drain, CT negative, bronch performed (mucus plugging RLL) 9/15 IVC filter placed  Pt alert on visit today. Sister at bedside Patient is currently intubated on ventilator support MV: 16.2 L/min Temp (24hrs), Avg:98 F (36.7 C), Min:97.6 F (36.4 C), Max:98.5 F (36.9 C)  TPN @ 90 ml/hr providing 131 g of protein, 2200 kcals.  Reassess energy needs today, TPN meet 100% protein needs, 103% calorie needs (acceptable) Noted pt receiving Vitamin C 500 mg d/t concern for deficiency  Net -8 L since admission. Weight down to 77.3 kg.  LE significantly less than previous assessment  Minimal NG output this shift thus far today, 2 L  yesterday however.  JP drain output improved, 730 mL yesterday, 110 mL thus far today  +stool via ileostomy, BS present Good UOP, 1.1 ml/kg/hr   Labs: albumin 1.9, corrected calcium Meds: fentanyl, versed, precedex, ss novolog  Diet Order:   Diet Order            Diet NPO time specified  Diet effective now              EDUCATION NEEDS:   Not appropriate for education at this time  Skin:  Skin Assessment: Skin Integrity Issues: Skin Integrity Issues:: Incisions Incisions: abdomen  Last BM:  +stool via ileosotmy 8/16  Height:   Ht Readings from Last 1 Encounters:  09/27/17 5' 7.99" (1.727 m)    Weight:   Wt Readings from Last 1 Encounters:  10/03/17 77.3 kg    Ideal Body Weight:  70 kg  BMI:  Body mass index is 25.92 kg/m.  Estimated Nutritional Needs:   Kcal:  2141 kcals  Protein:  130-160 g  Fluid:  >/= 2 L   Kerman Passey MS, RD, LDN, CNSC [phone] Pager  [phone] Weekend/On-Call Pager

## 2017-10-03 NOTE — Progress Notes (Addendum)
Brief Nutrition Follow-up:  Received page from MD Agarwala regarding TPN. MD requesting that dextrose be decreased in TPN with goal to meet 80% of estimated calorie needs via TPN while maintaining adequate protein (2g/kg). TPN has already been mixed for today. MD ok with TPN adjustments not starting until tomorrow  RD to follow-up with TPN Pharmacist in the AM  [facility] MS, RD, LDN, CNSC [phone] Pager  [phone] Weekend/On-Call Pager

## 2017-10-03 NOTE — Progress Notes (Signed)
PHARMACY - ADULT TOTAL PARENTERAL NUTRITION CONSULT NOTE   Pharmacy Consult for TPN Indication: massive bowel resection  Patient Measurements: Height: 5' 7.99" (172.7 cm) Weight: 170 lb 6.7 oz (77.3 kg) IBW/kg (Calculated) : 68.38  TPN AdjBW (KG): 77.8 Body mass index is 25.92 kg/m. Usual Weight: 87 kg  Assessment:  [name] admitted on 9/4 with complete colonic inertia, megacolon and severe obstructive constipation. Pt indicates poor intake and weight loss PTA over 1 month time frame. 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 Was found to have a massively distended colon from the cecum all the way to distal rectum and pelvic rim.  No evidence of perforation. Pharmacy has been consulted to start TPN for severe malnutrition related to chronic illness (cardiomyopathy, substance abuse, chronic constipation/obstruction with megacolon) associated with severe muscle and fat depletion and edema.  GI: Ogilvie's syndrome - s/p colectomy and ileostomy 9/10. NPO 8 days (since admission) with poor oral intake PTA. JP drain output down at 730 ml over last 24 h. NGT output 2L.  LBM 9/14. TPN initiated 9/12. Wt down significantly (-32.7 lbs) since admission. Pre-albumin improved from <5 to 13.3. CT abd neg 9/14 for new acute process Endo: hypothyroidism (TSH 6.6 and T4 0.62 on 9/1) - continue levothyroxine. CBGs at goal. Insulin requirements in the past 24 hours: 0 unit SSI Lytes: WNL Renal: SCr stable 0.42, 2 L UOP x 24 hr Pulm: procedural intubation 9/10 - extubated 9/12; re-intubated 9/13. FiO2 30% on PRVC.  -9/14 Bronch d/t mucus plugging in RLL >> unable to completely remove plug with bronch lavage. Possible extubation planned for 9/16.  Cards: bilateral PE noted on admission likely provoked by lower extremity DVT in the setting of pelvic vein compression - on heparin drip (currently on hold for increased JP drain output which is bloody - pt to get IVC filter 9/15 d/t  inability to resume heparin). CT did not show evidence of RV strain. LVEF 55-60%. NSR. BP improved.  Hepatobil: LFTs wnl, tbili wnl, TG wnl Neuro: history of MDD, substance abuse disorder, ADD - Medications PTA: buspirone, citalopram, divalproex, gabapentin (agitation), quetiapine, and trazodone. Precedex at 64mcg/kg/hr + Versed at 10mg /hr and prns for sedation. Avoiding continuous opiates with Ogilvie's syndrome. CPOT 0-3. RASS -2 to 2 (goal 0).  ID: zosyn to cover for possible aspiration, WBC wnl, afebrile  TPN Access: PICC triple lumen placed 9/11 TPN start date: 9/12 Nutritional Goals (per RD recommendation on 9/11): KCal: 2244 kcal  Protein: 130-160 g Fluid: > 2 L  Goal TPN rate is 90 ml/hr.  Current Nutrition: none  Plan:  Continue TPN at 90 mL/hr (goal rate) This TPN provides  131 g of protein, 345 g of dextrose, and 49.8 g of lipids meeting 100% of patient's needs. Electrolytes in TPN: continue current K and Phos (increased amt 9/12), GM:WNUUVOZ 1:1 Add MVI and trace elements to TPN Add Vit C 500 mg d/t concerns for severe deficiency Add Pepcid IV to TPN Continue sensitive SSI q6h for now and monitor.   Monitor TPN labs. Follow-up for extubation/clinical status and possible adjustment of needs.   Link Snuffer, PharmD, BCPS, BCCCP Clinical Pharmacist Clinical phone 10/03/2017 until 3:30PM [phone] Please refer to [facility] for [facility] Pharmacy numbers 10/03/2017 7:45 AM

## 2017-10-04 ENCOUNTER — Inpatient Hospital Stay (HOSPITAL_COMMUNITY): Payer: BLUE CROSS/BLUE SHIELD

## 2017-10-04 LAB — BASIC METABOLIC PANEL
ANION GAP: 7 (ref 5–15)
BUN: 9 mg/dL (ref 6–20)
CALCIUM: 8.1 mg/dL — AB (ref 8.9–10.3)
CO2: 22 mmol/L (ref 22–32)
Chloride: 114 mmol/L — ABNORMAL HIGH (ref 98–111)
Creatinine, Ser: 0.54 mg/dL — ABNORMAL LOW (ref 0.61–1.24)
GFR calc Af Amer: >60 mL/min (ref >60)
GLUCOSE: 164 mg/dL — AB (ref 70–99)
Potassium: 4.2 mmol/L (ref 3.5–5.1)
SODIUM: 143 mmol/L (ref 135–145)

## 2017-10-04 LAB — GLUCOSE, CAPILLARY
GLUCOSE-CAPILLARY: 110 mg/dL — AB (ref 70–99)
GLUCOSE-CAPILLARY: 100 mg/dL — AB (ref 70–99)
Glucose-Capillary: 133 mg/dL — ABNORMAL HIGH (ref 70–99)
Glucose-Capillary: 121 mg/dL — ABNORMAL HIGH (ref 70–99)
Glucose-Capillary: 109 mg/dL — ABNORMAL HIGH (ref 70–99)
Glucose-Capillary: 109 mg/dL — ABNORMAL HIGH (ref 70–99)

## 2017-10-04 MED ORDER — FENTANYL CITRATE (PF) 100 MCG/2ML IJ SOLN
200.0000 ug | Freq: Once | INTRAMUSCULAR | Status: AC
  Administered 2017-10-05: 200 ug via INTRAVENOUS
  Filled 2017-10-04: qty 4

## 2017-10-04 MED ORDER — CHLORHEXIDINE GLUCONATE 0.12 % MT SOLN
OROMUCOSAL | Status: AC
  Filled 2017-10-04: qty 15

## 2017-10-04 MED ORDER — VALPROIC ACID 250 MG/5ML PO SOLN
500.0000 mg | Freq: Two times a day (BID) | ORAL | Status: DC
  Administered 2017-10-04 – 2017-10-21 (×33): 500 mg
  Filled 2017-10-04 (×34): qty 10

## 2017-10-04 MED ORDER — VECURONIUM BROMIDE 10 MG IV SOLR: 10.0000 mg | Freq: Once | INTRAVENOUS | Status: DC

## 2017-10-04 MED ORDER — QUETIAPINE FUMARATE 50 MG PO TABS
50.0000 mg | ORAL_TABLET | Freq: Two times a day (BID) | ORAL | Status: DC
  Administered 2017-10-04 – 2017-10-06 (×5): 50 mg
  Filled 2017-10-04 (×6): qty 1

## 2017-10-04 MED ORDER — ETOMIDATE 2 MG/ML IV SOLN
40.0000 mg | Freq: Once | INTRAVENOUS | Status: AC
  Administered 2017-10-05: 20 mg via INTRAVENOUS
  Filled 2017-10-04: qty 20

## 2017-10-04 MED ORDER — OXYCODONE HCL 5 MG PO TABS
5.0000 mg | ORAL_TABLET | Freq: Four times a day (QID) | ORAL | Status: DC
  Administered 2017-10-04 – 2017-10-17 (×51): 5 mg
  Filled 2017-10-04 (×52): qty 1

## 2017-10-04 MED ORDER — SODIUM CHLORIDE 0.9 % IV SOLN
1.0000 mg/h | INTRAVENOUS | Status: DC
  Administered 2017-10-04 – 2017-10-05 (×3): 10 mg/h via INTRAVENOUS
  Administered 2017-10-05 (×2): 8 mg/h via INTRAVENOUS
  Administered 2017-10-06 – 2017-10-07 (×3): 10 mg/h via INTRAVENOUS
  Filled 2017-10-04: qty 20
  Filled 2017-10-04: qty 10
  Filled 2017-10-04 (×5): qty 20

## 2017-10-04 MED ORDER — MIDAZOLAM HCL 2 MG/2ML IJ SOLN
4.0000 mg | Freq: Once | INTRAMUSCULAR | Status: AC
  Administered 2017-10-05: 4 mg via INTRAVENOUS

## 2017-10-04 MED ORDER — TRAVASOL 10 % IV SOLN
INTRAVENOUS | Status: AC
  Administered 2017-10-04: 18:00:00 via INTRAVENOUS
  Filled 2017-10-04: qty 1413.6

## 2017-10-04 NOTE — Progress Notes (Signed)
RT did not perform CPT at this time due to patient being agitated.

## 2017-10-04 NOTE — Progress Notes (Signed)
Philip Thompson  ZOX:[id number] DOB: [birth year] DOA: 09/21/2017 PCP: [name], MD    LOS: 12 days   Reason for Consult / Chief Complaint:  Pre op acute hypoxemic respiratory failure   Consulting MD and date:  [name]  HPI/Summary of hospital stay:  27 y/o male admitted with colonic inertia/megacolon and bilateral pulmonary emboli and DVT due to pelvic vein obstruction.  He had acute aspiration pneumonia on 9/10 prior to surgery in the setting of nausea/vomiting.  He went to the OR on 9/10 for total colectomy and ileostomy.    9/12 extubated 9/13 re-intubated 9/14 bleeding from JP prior to starting heparin, ct negative; mucus plugging RLL 9/16 continued bloody drainage from JP, mucous plugs requiring hypertonic nebs and bag lavage  Subjective:  Continued agitation, very anxious this AM.  Wants restraints off and says he is in pain. Had posey and leg restraints added PM 9/16 due to sliding down the bed and near self extubation.  After discussion with family and Dr. [name], plans for trach tomorrow 9/18.  Objective   Blood pressure 97/65, pulse 95, temperature (!) 100.8 F (38.2 C), temperature source Axillary, resp. rate (!) 26, height 5' 7.99" (1.727 m), weight 70.8 kg, SpO2 100 %.    Vent Mode: PRVC FiO2 (%):  [30 %] 30 % Set Rate:  [16 bmp] 16 bmp Vt Set:  [550 mL] 550 mL PEEP:  [5 cmH20] 5 cmH20 Pressure Support:  [10 cmH20] 10 cmH20 Plateau Pressure:  [16 cmH20-21 cmH20] 18 cmH20   Intake/Output Summary (Last 24 hours) at 10/04/2017 0800 Last data filed at 10/04/2017 0600 Gross per 24 hour  Intake 3386.72 ml  Output 5395 ml  Net -2008.28 ml   Filed Weights   10/02/17 0500 10/03/17 0431 10/04/17 0500  Weight: 76 kg 77.3 kg 70.8 kg    Examination:  General:  Adult male, in bed on vent, very anxious and agitated HENT: NCAT ETT in place PULM: CTA B, vent supported breathing CV: RRR, no mgr GI: no bowel sounds, midline scar intact, dressings C/D/I MSK:  normal bulk and tone Neuro: awake, on vent, moves all extremities, very anxious and agitated.   Consults: date of consult/date signed off & final recs:  General surgery 9/6 GI 9/6 Critical care 9/10   Procedures: 9/10 - Total colectomy and ileostomy.  Was found to have a massively distended colon from the cecum all the way to distal rectum and pelvic rim.  No evidence of perforation. Oral endotracheal tube 9/10 >> 9/12.  9/13 >  PICC 9/11>>   Significant Diagnostic Tests: 9/5 CT chest, abdomen and pelvis: Bilateral PE w/o RHS, diffuse emphysema, massively distended colon and rectum filled with gas and stool. 9/14 CT abdomen/pelvis> subtotal colectomy and ileostomy, fluid filled dilated small bowel loops, likely post operative, though local enteritis not excluded. JP drain in RLQ; rectal wall thickening improevd, worsening right base airspace disease.  Micro Data: Sputum culture 9/10 >>  Antimicrobials:  Zosyn (possible aspiration) 9/10 > (stop date 9/19 for 10 days total)   Assessment & Plan:   Acute respiratory failure with hypoxemia due to aspiration pneumonia and mucus plugging the RLL improved 9/15.  Has failed weaning past few days due to tachypnea and increased WOB. > after discussion with family, plans for trach tomorrow 9/18 given neuromuscular weakness. > continue chest PT, bag lavage q shift. > continue full vent support. > follow CXR.  Thrombocytopenia: improved. > monitor for bleeding. > transfuse if <  50K while bleeding.  Provoked DVT/PE from bowel obstruction/abdominal compartment syndrome: cannot anticoagulate as he bled from JP on 9/14.  S/p retrievable filter 9/15. > ideally would retrieve filter in 6-8 weeks if able to tolerate anticoagulation.  Hemorrhagic anemia: bleeding from JP drain but CT did not identify a clear cause; complicated by malnourished state > monitor drain output. > if drain output increases and Hgb drops discuss further with surgery  (re-exploration? Tagged RBC?). > Monitor for bleeding. > Transfuse PRBC for Hgb < 7 gm/dL. > follow coags. > no anticoagulants. > consider sending Vit C level.  Ogilvie/obwel obstruction s/p colectomy and ileostomy. > wound care per surgery.  Protein calorie malnutrition. > continue TPN, adjusted today 9/17. > trickle tube feeds started 9/16.  Anxiety/depression. > continue depakote. > added clonopin, buspar, seroquel 9/16.  Need for sedation while on mechanical ventilation. > PAD protocol. > RASS goal -1. > versed infusion with fentanyl prn (minimize continuous use with bowel inertia). > enteral oxycodone added 9/17. > added seroquel (lowered from home regimen), klonopin, buspar (lowered from home regimen).   Disposition / Summary of Today's Plan 10/04/17   Remain in ICU    Feeding: TPN, trickle tube feeds Analgesia: minimized Sedation: RASS goal -1, versed, precedex, seroquel (lowered from home regimen), klonopin, buspar (lowered from home regimen) Thromboprophylaxis: holding heparin HOB >30 degrees Ulcer prophylaxis: famotidine Glucose control: adequate   Labs   CBC: Recent Labs  Lab 09/29/17 0520  10/01/17 0530  10/02/17 0225 10/02/17 0629 10/02/17 1030 10/02/17 2018 10/03/17 0329  WBC 4.4   < > 7.2   < > 7.3 7.4 8.3 7.6 7.6  NEUTROABS 2.1  --  4.6  --   --   --   --   --  5.4  HGB 8.0*   < > 8.8*   < > 10.6* 10.9* 11.1* 10.4* 10.4*  HCT 25.6*   < > 26.4*   < > 32.6* 32.5* 33.3* 32.4* 32.0*  MCV 88.3   < > 86.0   < > 87.6 87.4 87.4 92.8 91.4  PLT 101*   < > 115*   < > 122* 131* 125* 111* 126*   < > = values in this interval not displayed.   Basic Metabolic Panel: Recent Labs  Lab 09/29/17 0520 09/30/17 0854 10/01/17 0530 10/01/17 1330 10/02/17 0629 10/03/17 0600 10/04/17 0638  NA 137 137 140 140 142 139 143  K 4.1 3.2* 3.3* 4.6 3.8 3.8 4.2  CL 104 99 105 107 110 108 114*  CO2 28 29 27 27 24 24 22   GLUCOSE 95 122* 132* 136* 121* 118* 164*    BUN 12 5* 6 6 5* 7 9  CREATININE 0.63 0.53* 0.46* 0.44* 0.45* 0.42* 0.54*  CALCIUM 7.6* 8.3* 7.9* 8.1* 8.3* 8.2* 8.1*  MG 1.9 1.9 1.9  --  1.9 1.9  --   PHOS 2.3* 2.4* 2.9  --  3.0 3.8  --    GFR: Estimated Creatinine Clearance: 134.2 mL/min (A) (by C-G formula based on SCr of 0.54 mg/dL (L)). Recent Labs  Lab 10/02/17 0629 10/02/17 1030 10/02/17 2018 10/03/17 0329  WBC 7.4 8.3 7.6 7.6   Liver Function Tests: Recent Labs  Lab 09/28/17 0328 09/29/17 0520 10/03/17 0600  AST 29 21 15   ALT 25 22 19   ALKPHOS 58 50 75  BILITOT 0.8 0.8 0.3  PROT 5.0* 3.9* 5.0*  ALBUMIN 2.6* 1.9* 1.9*   No results for input(s): LIPASE, AMYLASE in the last 168  hours. No results for input(s): AMMONIA in the last 168 hours. ABG    Component Value Date/Time   PHART 7.464 (H) 09/30/2017 1107   PCO2ART 36.8 09/30/2017 1107   PO2ART 60.0 (L) 09/30/2017 1107   HCO3 26.3 09/30/2017 1107   TCO2 27 09/30/2017 1107   ACIDBASEDEF 1.0 09/27/2017 1551   O2SAT 92.0 09/30/2017 1107    Coagulation Profile: Recent Labs  Lab 10/02/17 1030  INR 1.02   Cardiac Enzymes: No results for input(s): CKTOTAL, CKMB, CKMBINDEX, TROPONINI in the last 168 hours. HbA1C: Hgb A1c MFr Bld  Date/Time Value Ref Range Status  03/27/2017 06:54 AM 5.4 4.8 - 5.6 % Final    Comment:    (NOTE) Pre diabetes:          5.7%-6.4% Diabetes:              >6.4% Glycemic control for   <7.0% adults with diabetes   07/02/2016 07:34 AM 5.4 4.8 - 5.6 % Final    Comment:    (NOTE)         Pre-diabetes: 5.7 - 6.4         Diabetes: >6.4         Glycemic control for adults with diabetes: <7.0    CBG: Recent Labs  Lab 10/03/17 1808 10/03/17 1912 10/03/17 2316 10/04/17 0332 10/04/17 0718  GLUCAP 112* 114* 118* 110* 133*     CC time: 30 min.   Rutherford Guys, [place] [facility] Pager: [phone]  or [phone] 10/04/2017, 8:00 AM

## 2017-10-04 NOTE — Consult Note (Signed)
WOC Nurse ostomy follow up Stoma type/location: RLQ ileostomy. Patient agitated.  Mother and sister in room. Pouch had leaked a couple of hours ago and bedside RNs changed it.  No ring used, flat pouch.  Will use convex pouch with next change or ring and flat pouch if they have not yet been attained. Stomal assessment/size: 1 and 3/8 inches round, red, raised (visualized through pouch) Peristomal assessment: not seen today Treatment options for stomal/peristomal skin: none Output: no effluent in pouch  Ostomy pouching: 1pc.flat in place and 3 extra pouches in room (one precut to 1 and 3/8 inch for nurse's convenience) and 3 rings.   Education provided: Discussed with sister why products such as liquid skin barrier films are not being used, specifically, that the [employer] products infused are infused with ceramides to enhance skin integrity and that placement of a liquid skin barrier film would diminish the effects of that additive.   WOC nursing team will not follow, but will remain available to this patient, the nursing and medical teams.  Please re-consult if needed. Thanks, Ladona Mow, MSN, RN, GNP, [facility]  Pager# [phone]

## 2017-10-04 NOTE — Progress Notes (Signed)
eLink Physician-Brief Progress Note Patient Name: TAMIKA MOHN DOB: 06-25-1990 MRN: [id number]   Date of Service  10/04/2017  HPI/Events of Note  Severe Agitation - Request to renew restraint orders.   eICU Interventions  Will renew restraint orders.      Intervention Category Major Interventions: Delirium, psychosis, severe agitation - evaluation and management  Sommer,Steven Eugene 10/04/2017, 7:48 PM

## 2017-10-04 NOTE — Progress Notes (Signed)
Pt a near self extubation due to frequently sliding down in bed in attempt to reach ETT.  Writer encountered patient with restraints untied this shift.  Patient's sister regretfully admitted untying soft wrist restraints, stating that she is DON over a memory care unit, also stating she knows better.  Reapplied patient's restraints, awaiting safety sitter per order.  Patient's sister updated concerning addition of Posey belt, and soft ankle restraints with Kristeen Miss, Consulting civil engineer in witness.  Will continue to monitor.

## 2017-10-04 NOTE — Progress Notes (Signed)
Nutrition Follow-up  DOCUMENTATION CODES:   Severe malnutrition in context of chronic illness  INTERVENTION:   TPN:  Adjusteded to meet 80% calorie needs, 100% protein needs post discussion with MD and Pharmacist. Continue TPN per Pharmacy.   Tube Feeding:  Continue Vital AF 1.2 @ 20 ml/hr  Recommend exchanging NG tube out for Cortrak tube post trach placement   NUTRITION DIAGNOSIS:   Severe Malnutrition related to chronic illness(cardiomyopathy, substance abuse, chronic constipation/obstipation with megacolon) as evidenced by severe muscle depletion, severe fat depletion, edema.  Being addressed via nutrition support  GOAL:   Patient will meet greater than or equal to 90% of their needs  Met  MONITOR:   Diet advancement, Labs, Weight trends, Skin, I & O's(TPN)  REASON FOR ASSESSMENT:   Ventilator    ASSESSMENT:   27 yo male admitted 9/4 for megacolon and severe obstructive constipation with severely dilated colon and colitis. Noted pt admitted to [facility] at the end of August with constipation x 3 weeks with megacolon with medical therapy attempts, discharged on 9/2. Pt also with chest pain with bilateral PE on [place] includes polysubstance abuse, drug-induced cardiomyopathy, anxiety, chronic obstipation/constipation  9/10 Total colectomy with ileostomy, Vent 9/11 PICC line inserted 9/12 TPN initiated, Extubated 9/13 Re-Intubated 9/16 Trickle feedings started  Patient remains on ventilator support, agitated overnight requiring restraints Plan for trach tomorrow MV: 18 L/min Temp (24hrs), Avg:99.5 F (37.5 C), Min:98.7 F (37.1 C), Max:100.8 F (38.2 C)  TPN @ 90 ml/hr, TPN adjusted today with reduced dextrose and increased protein to meet 100% of protein and 80% calorie needs. New TPN @ 95 ml/hr provides 141 g of protein and 1882 kcals.   Tolerating Vital AF 1.2 @ 20 ml/hr; TF held overnight due to inability to maintain HOB >30 degrees; pt agitated  over night and kept sliding down in bed trying to remove ETT, almost self-extubated. Trickle TF resumed this AM.   Weight continues to trend down; weight of 70.8 kg today. Net negative 10 Liters. Unable to determine exactly how much of this weight loss was associated with removal of massively distended colon during surgery on 9/10, but weight lost was likely significant. RN indicating that colon specimen weighed 45 pounds. Nutritional needs re-assessed.  UOP 4.4 L, JP drain 720 mL 380 mL stool via ileostomy on 9/16, 600 mL out thus far today  Labs: reviewed; CBGs 104-121 (sliding scale coverage ordered but pt has not required despite being on TPN), TG wdl Meds: reviewed   Diet Order:   Diet Order            Diet NPO time specified  Diet effective midnight        Diet NPO time specified  Diet effective now              EDUCATION NEEDS:   Not appropriate for education at this time  Skin:  Skin Assessment: Skin Integrity Issues: Skin Integrity Issues:: Incisions Incisions: abdomen  Last BM:  +stool via ileosotmy 8/16  Height:   Ht Readings from Last 1 Encounters:  09/27/17 5' 7.99" (1.727 m)    Weight:   Wt Readings from Last 1 Encounters:  10/04/17 70.8 kg    Ideal Body Weight:  70 kg  BMI:  Body mass index is 23.74 kg/m.  Estimated Nutritional Needs:   Kcal:  2318 kcals   Protein:  142-156 g  Fluid:  >/= 2 L   Kerman Passey MS, RD, LDN, CNSC [phone] Pager  [phone] Weekend/On-Call Pager

## 2017-10-04 NOTE — Progress Notes (Signed)
7 Days Post-Op      Subjective: He remains agitated, His ostomy is working well.  Abdomina wound looks fine.  He is still draining a bloody serous fluid from his JP  Objective: Vital signs in last 24 hours: Temp:  [98 F (36.7 C)-100.8 F (38.2 C)] 100.8 F (38.2 C) (09/17 0730) Pulse Rate:  [44-116] 103 (09/17 0808) Resp:  [11-48] 24 (09/17 0808) BP: (97-217)/(52-179) 121/72 (09/17 0808) SpO2:  [91 %-100 %] 100 % (09/17 0809) FiO2 (%):  [30 %] 30 % (09/17 0809) Weight:  [70.8 kg] 70.8 kg (09/17 0500) Last BM Date: 08/18/17  Trickle feed thru NG 151 3400 IV 4420 urine JP drain 720 Stool 380 Low grade fevers this AM 100.5/100.8 BMP OK, no CBC this AM Intake/Output from previous day: 09/16 0701 - 09/17 0700 In: 3680.7 [I.V.:3388.7; NG/GT:151.7; IV Piggyback:140.3] Out: 7035 [Urine:4420; Drains:720; Stool:380] Intake/Output this shift: No intake/output data recorded.  General appearance: alert and agitated on high dose sedation Resp: clear to auscultation bilaterally and anterior GI: soft, few BS, ileostomy looks fine, just changed the bag, but ileostomy is working well.  Incision looks fine.   Skin: Skin color, texture, turgor normal. No rashes or lesions or some brusing left thigh  Lab Results:  Recent Labs    10/02/17 2018 10/03/17 0329  WBC 7.6 7.6  HGB 10.4* 10.4*  HCT 32.4* 32.0*  PLT 111* 126*    BMET Recent Labs    10/03/17 0600 10/04/17 0638  NA 139 143  K 3.8 4.2  CL 108 114*  CO2 24 22  GLUCOSE 118* 164*  BUN 7 9  CREATININE 0.42* 0.54*  CALCIUM 8.2* 8.1*   PT/INR Recent Labs    10/02/17 1030  LABPROT 13.4  INR 1.02    Recent Labs  Lab 09/28/17 0328 09/29/17 0520 10/03/17 0600  AST '29 21 15  ' ALT '25 22 19  ' ALKPHOS 58 50 75  BILITOT 0.8 0.8 0.3  PROT 5.0* 3.9* 5.0*  ALBUMIN 2.6* 1.9* 1.9*     Lipase     Component Value Date/Time   LIPASE 37 09/21/2017 2237     Medications: . albuterol  2.5 mg Nebulization Q4H  .  busPIRone  5 mg Per Tube TID  . chlorhexidine gluconate (MEDLINE KIT)  15 mL Mouth Rinse BID  . Chlorhexidine Gluconate Cloth  6 each Topical Daily  . clonazePAM  1 mg Per Tube BID  . feeding supplement (VITAL AF 1.2 CAL)  1,000 mL Per Tube Q24H  . insulin aspart  0-9 Units Subcutaneous Q6H  . levothyroxine  37.5 mcg Intravenous Daily  . mouth rinse  15 mL Mouth Rinse 10 times per day  . QUEtiapine  50 mg Per Tube QHS  . sodium chloride flush  10-40 mL Intracatheter Q12H  . sodium chloride flush  3 mL Intravenous Q12H   . dexmedetomidine (PRECEDEX) IV infusion 2 mcg/kg/hr (10/04/17 0038)  . midazolam (VERSED) infusion 10 mg/hr (10/04/17 0234)  . piperacillin-tazobactam (ZOSYN)  IV 3.375 g (10/04/17 0240)  . TPN ADULT (ION) 90 mL/hr at 10/03/17 1723  . valproate sodium 500 mg (10/03/17 2219)    Assessment/Plan  History ADD, anxiety, depression, polysubstance abuse - home meds currently held; will defer anxiety treatment to CCM - fentanyl x 7 9/16; haldol x 3 9/16, Precedex/Versed drips, Versed bolus, Valproate drip Protein-calorie malnutirition - TNA Acute blood loss anemia and anemia of chronic disease - 1,000cc sanguinous drainage in JP on 9/12; transfused pRBC and FFP  9/12;  Acute hypoxic respiratory failure, aspiration PNA - per CCM, extubated 9/12 - reintubated 09/30/17  - will need tracheostomy - planned for 10/05/17 Acute bilateral PE - per CCM, holding heparin due to bloody high output JP Emesis and possible aspiration Post op bleeding transfused multiple times, 2 units of PRBC 9/14, last transfusion  Colonic inertia Megacolon with rectal fecal impaction POD#7 S/P total abdominal colectomy, ileostomy, Dr. Ninfa Linden, 09/27/17 - NG 7,000 cc/24h, continue NG tube to LIWS - starting trickle tube feeds  - continue JP, 710 cc/24h, follow ouput  - await return of bowel function   FEN:NPO, NGT, TPN VTE: SCD's,holdingheparin MY:TRZNB 09/10>> day 8 - low grade  fever Foley:placed 9/10 Follow up:Dr. Ninfa Linden  Plan:  CCM plans a tracheostomy tomorrow.  Family is in the room and despite, high levels of sedation he still requires restraints. Continue Zosyn.    LOS: 12 days    Philip Thompson 10/04/2017 [phone]

## 2017-10-04 NOTE — Progress Notes (Signed)
PHARMACY - ADULT TOTAL PARENTERAL NUTRITION CONSULT NOTE   Pharmacy Consult for TPN Indication: massive bowel resection  Patient Measurements: Height: 5' 7.99" (172.7 cm) Weight: 156 lb 1.4 oz (70.8 kg) IBW/kg (Calculated) : 68.38  TPN AdjBW (KG): 77.8 Body mass index is 23.74 kg/m. Usual Weight: 87 kg  Assessment:  [name] admitted on 9/4 with complete colonic inertia, megacolon and severe obstructive constipation. Pt indicates poor intake and weight loss PTA over 1 month time frame. 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 Was found to have a massively distended colon from the cecum all the way to distal rectum and pelvic rim.  No evidence of perforation. Pharmacy has been consulted to start TPN for severe malnutrition related to chronic illness (cardiomyopathy, substance abuse, chronic constipation/obstruction with megacolon) associated with severe muscle and fat depletion and edema.  GI: Ogilvie's syndrome - s/p colectomy and ileostomy 9/10. NPO 8 days (since admission) with poor oral intake PTA. TPN initiated 9/12. JP drain output 710 ml over last 24 hr. Wt down significantly (~50 lbs) since admission. Pre-albumin improved from <5 to 13.3. CT abd neg 9/14 for new acute process. 9/16 Pt started on trickle TF (Vital AF 1.2 at 22ml/hr). Spoke with RN and pt tolerated yesterday but off overnight as pt continues to slide down in bed - likely restart today but trach to be placed tomorrow so will be held for that too. Endo: hypothyroidism (TSH 6.6 and T4 0.62 on 9/1) - continue levothyroxine. CBGs at goal. Insulin requirements in the past 24 hours: 0 unit SSI Lytes: WNL, Corr Ca 9.8 Renal: SCr stable, 3L UOP past 24 hours. Net negative ~18L over past week.  Pulm: procedural intubation 9/10 - extubated 9/12; re-intubated 9/13. FiO2 30% on PRVC.  -9/14 Bronch d/t mucus plugging in RLL >> unable to completely remove plug with bronch lavage. Probable trach  9/18. Cards: bilateral PE noted on admission likely provoked by lower extremity DVT in the setting of pelvic vein compression - was on heparin drip (currently on hold for increased JP drain output which is bloody - s/p IVC filter 9/15 d/t inability to resume heparin). CT did not show evidence of RV strain. LVEF 55-60%. NSR. BP ok. HR 90-100s.  Hepatobil: LFTs wnl, tbili wnl, TG wnl Neuro: history of MDD, substance abuse disorder, ADD - Medications PTA: buspirone, citalopram, divalproex, gabapentin (agitation), quetiapine, and trazodone. Precedex at 46mcg/kg/hr + Versed at 10mg /hr and prns for sedation. Avoiding continuous opiates with Ogilvie's syndrome. CPOT 2-5. RASS -1 to 2 (goal 0).  ID: [email or web address] for possible aspiration, WBC wnl, tm 100.8  TPN Access: PICC triple lumen placed 9/11 TPN start date: 9/12 Nutritional Goals (per RD recommendation on 9/17): KCal: 2318 Protein: 142 g Fluid: > 2 L  9/16 pm **Dr. [name] requesting that dextrose be decreased in TPN with goal to meet 80% of estimated calorie needs via TPN while maintaining adequate protein.**  Current Nutrition: Vital AF at 35ml/hr + TPN  Plan:  Change formulation of TPN (increase protein, decrease dextrose and lipids) so that new goal rate of 95 ml/hr will provide 141 gm (~2g/kg) protein and 1882 kcal (~80% estimated kcal needs).  Electrolytes in TPN: continue current amount of K and Phos (increased 9/12), XB:JYNWGNF 1:1 MVI and trace elements in TPN Vit C 500 mg d/t concerns for severe deficiency Pepcid IV to TPN Continue sensitive SSI q6h for now and monitor   Monitor TPN labs. F/u trickle TF tolerance  and ability to advance  Christoper Fabian, PharmD, BCPS Clinical pharmacist  **Pharmacist phone directory can now be found on [email or web address] ([email or web address]).  Listed under [facility]. 10/04/2017 8:59 AM

## 2017-10-05 ENCOUNTER — Encounter (HOSPITAL_COMMUNITY): Payer: Self-pay

## 2017-10-05 ENCOUNTER — Inpatient Hospital Stay (HOSPITAL_COMMUNITY): Payer: BLUE CROSS/BLUE SHIELD

## 2017-10-05 DIAGNOSIS — J9601 Acute respiratory failure with hypoxia: Secondary | ICD-10-CM

## 2017-10-05 LAB — BASIC METABOLIC PANEL
ANION GAP: 10 (ref 5–15)
Anion gap: 8 (ref 5–15)
BUN: 14 mg/dL (ref 6–20)
BUN: 16 mg/dL (ref 6–20)
CHLORIDE: 103 mmol/L (ref 98–111)
CO2: 20 mmol/L — ABNORMAL LOW (ref 22–32)
CO2: 23 mmol/L (ref 22–32)
CREATININE: 0.61 mg/dL (ref 0.61–1.24)
Calcium: 8.2 mg/dL — ABNORMAL LOW (ref 8.9–10.3)
Calcium: 8.7 mg/dL — ABNORMAL LOW (ref 8.9–10.3)
Chloride: 107 mmol/L (ref 98–111)
Creatinine, Ser: 0.57 mg/dL — ABNORMAL LOW (ref 0.61–1.24)
GFR calc Af Amer: >60 mL/min (ref >60)
GFR calc non Af Amer: >60 mL/min (ref >60)
GLUCOSE: 104 mg/dL — AB (ref 70–99)
Glucose, Bld: 754 mg/dL (ref 70–99)
POTASSIUM: 4.6 mmol/L (ref 3.5–5.1)
Potassium: >7.5 mmol/L (ref 3.5–5.1)
SODIUM: 131 mmol/L — AB (ref 135–145)
Sodium: 140 mmol/L (ref 135–145)

## 2017-10-05 LAB — PHOSPHORUS
PHOSPHORUS: 8.6 mg/dL — AB (ref 2.5–4.6)
Phosphorus: 4.8 mg/dL — ABNORMAL HIGH (ref 2.5–4.6)

## 2017-10-05 LAB — GLUCOSE, CAPILLARY
GLUCOSE-CAPILLARY: 132 mg/dL — AB (ref 70–99)
GLUCOSE-CAPILLARY: 111 mg/dL — AB (ref 70–99)
GLUCOSE-CAPILLARY: 118 mg/dL — AB (ref 70–99)
Glucose-Capillary: 100 mg/dL — ABNORMAL HIGH (ref 70–99)
Glucose-Capillary: 117 mg/dL — ABNORMAL HIGH (ref 70–99)
Glucose-Capillary: 85 mg/dL (ref 70–99)

## 2017-10-05 LAB — CBC WITH DIFFERENTIAL/PLATELET
ABS IMMATURE GRANULOCYTES: 0.3 10*3/uL — AB (ref 0.0–0.1)
BASOS PCT: 1 %
Basophils Absolute: 0.1 10*3/uL (ref 0.0–0.1)
EOS ABS: 0.2 10*3/uL (ref 0.0–0.7)
EOS PCT: 2 %
HEMATOCRIT: 36.9 % — AB (ref 39.0–52.0)
Hemoglobin: 11.5 g/dL — ABNORMAL LOW (ref 13.0–17.0)
IMMATURE GRANULOCYTES: 2 %
LYMPHS ABS: 1.2 10*3/uL (ref 0.7–4.0)
Lymphocytes Relative: 11 %
MCH: 28.5 pg (ref 26.0–34.0)
MCHC: 31.2 g/dL (ref 30.0–36.0)
MCV: 91.6 fL (ref 78.0–100.0)
MONO ABS: 1.2 10*3/uL — AB (ref 0.1–1.0)
MONOS PCT: 11 %
Neutro Abs: 8 10*3/uL — ABNORMAL HIGH (ref 1.7–7.7)
Neutrophils Relative %: 73 %
PLATELETS: 189 10*3/uL (ref 150–400)
RBC: 4.03 MIL/uL — AB (ref 4.22–5.81)
RDW: 15.4 % (ref 11.5–15.5)
WBC: 10.9 10*3/uL — ABNORMAL HIGH (ref 4.0–10.5)

## 2017-10-05 LAB — MAGNESIUM
MAGNESIUM: 2.5 mg/dL — AB (ref 1.7–2.4)
MAGNESIUM: 2 mg/dL (ref 1.7–2.4)

## 2017-10-05 LAB — CBC
HCT: 34 % — ABNORMAL LOW (ref 39.0–52.0)
HEMOGLOBIN: 10.7 g/dL — AB (ref 13.0–17.0)
MCH: 29.6 pg (ref 26.0–34.0)
MCHC: 31.5 g/dL (ref 30.0–36.0)
MCV: 94.2 fL (ref 78.0–100.0)
PLATELETS: 156 10*3/uL (ref 150–400)
RBC: 3.61 MIL/uL — ABNORMAL LOW (ref 4.22–5.81)
RDW: 15.8 % — AB (ref 11.5–15.5)
WBC: 10.4 10*3/uL (ref 4.0–10.5)

## 2017-10-05 MED ORDER — PHENYLEPHRINE 40 MCG/ML (10ML) SYRINGE FOR IV PUSH (FOR BLOOD PRESSURE SUPPORT)
80.0000 ug | PREFILLED_SYRINGE | Freq: Once | INTRAVENOUS | Status: DC
  Filled 2017-10-05 (×2): qty 5

## 2017-10-05 MED ORDER — "THROMBI-PAD 3""X3"" EX PADS"
1.0000 | MEDICATED_PAD | Freq: Once | CUTANEOUS | Status: DC
  Filled 2017-10-05: qty 1

## 2017-10-05 MED ORDER — MIDAZOLAM HCL 2 MG/2ML IJ SOLN: 6.0000 mg | Freq: Once | INTRAMUSCULAR | Status: AC

## 2017-10-05 MED ORDER — TRAVASOL 10 % IV SOLN
INTRAVENOUS | Status: DC
  Administered 2017-10-05: 18:00:00 via INTRAVENOUS
  Filled 2017-10-05: qty 1413.6

## 2017-10-05 MED ORDER — MIDAZOLAM BOLUS VIA INFUSION
6.0000 mg | Freq: Once | INTRAVENOUS | Status: AC
  Administered 2017-10-05: 6 mg via INTRAVENOUS

## 2017-10-05 MED ORDER — ETOMIDATE 2 MG/ML IV SOLN: 16.0000 mg | Freq: Once | INTRAVENOUS | Status: AC

## 2017-10-05 MED ORDER — FENTANYL CITRATE (PF) 100 MCG/2ML IJ SOLN
100.0000 ug | Freq: Once | INTRAMUSCULAR | Status: AC
  Administered 2017-10-05: 100 ug via INTRAVENOUS

## 2017-10-05 MED ORDER — ROCURONIUM BROMIDE 50 MG/5ML IV SOLN
1.0000 mg/kg | Freq: Once | INTRAVENOUS | Status: AC
  Administered 2017-10-05: 71.2 mg via INTRAVENOUS
  Filled 2017-10-05: qty 7.12

## 2017-10-05 MED ORDER — NOREPINEPHRINE 4 MG/250ML-% IV SOLN
0.0000 ug/min | Freq: Once | INTRAVENOUS | Status: AC
  Administered 2017-10-05: 5 ug/min via INTRAVENOUS
  Filled 2017-10-05: qty 250

## 2017-10-05 MED ORDER — FENTANYL CITRATE (PF) 100 MCG/2ML IJ SOLN: 200.0000 ug | Freq: Once | INTRAMUSCULAR | Status: AC

## 2017-10-05 MED ORDER — FENTANYL CITRATE (PF) 100 MCG/2ML IJ SOLN
INTRAMUSCULAR | Status: AC
  Filled 2017-10-05: qty 2

## 2017-10-05 NOTE — Progress Notes (Signed)
Patient had manual CPT to count for 1600 CPT. Chest vest CPT will start at 2000.

## 2017-10-05 NOTE — Procedures (Signed)
Bronchoscopy Procedure Note JIMMEL STUPAK [id number] 05/27/1990  Procedure: Bronchoscopy Indications: Percutaneous Tracheostomy Placement  Procedure Details Consent: Risks of procedure as well as the alternatives and risks of each were explained to the (patient/caregiver).  Consent for procedure obtained. Time Out: Verified patient identification, verified procedure, site/side was marked, verified correct patient position, special equipment/implants available, medications/allergies/relevent history reviewed, required imaging and test results available.  Performed  In preparation for procedure, patient was given 100% FiO2 and bronchoscope lubricated. Sedation: Benzodiazepines, Etomidate and Fentanyl  Airway entered and the following bronchi were examined: n/a Procedures performed: none Bronchoscope removed.    Evaluation Hemodynamic Status: BP stable throughout; O2 sats: stable throughout Patient's Current Condition: stable Specimens:  None Complications: No apparent complications Patient did tolerate procedure well.   Procedure performed under direct supervision of Dr. [name].    Posey Boyer, AGACNP-BC [facility] Pgr: [phone] or if no answer [phone] 10/05/2017, 2:07 PM

## 2017-10-05 NOTE — Progress Notes (Signed)
PHARMACY - ADULT TOTAL PARENTERAL NUTRITION CONSULT NOTE   Pharmacy Consult for TPN Indication: massive bowel resection  Patient Measurements: Height: 5' 7.99" (172.7 cm) Weight: 156 lb 15.5 oz (71.2 kg) IBW/kg (Calculated) : 68.38  TPN AdjBW (KG): 77.8 Body mass index is 23.87 kg/m. Usual Weight: 87 kg  Assessment:  [name] admitted on 9/4 with complete colonic inertia, megacolon and severe obstructive constipation. Pt indicates poor intake and weight loss PTA over 1 month time frame. 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 Was found to have a massively distended colon from the cecum all the way to distal rectum and pelvic rim.  No evidence of perforation. Pharmacy has been consulted to start TPN for severe malnutrition related to chronic illness (cardiomyopathy, substance abuse, chronic constipation/obstruction with megacolon) associated with severe muscle and fat depletion and edema.  GI: Ogilvie's syndrome - s/p colectomy and ileostomy 9/10. NPO 8 days (since admission) with poor oral intake PTA. TPN initiated 9/12. JP drain output 390 ml over last 24 hr. Wt 156 lb - down significantly (~50 lbs) since admission; stable past 24 hours. LBM 9/17. Pre-albumin improved from <5 to 13.3. CT abd neg 9/14 for new acute process. 9/16 Pt started on trickle TF (Vital AF 1.2 at 54ml/hr). Spoke with RN and pt tolerating TF overnight but off since 0700 for trach today. Endo: hypothyroidism (TSH 6.6 and T4 0.62 on 9/1) - continue levothyroxine. CBGs at goal. Insulin requirements in the past 24 hours: 0 unit SSI Lytes: **Labs collected 9/18 0530 were contaminated with TPN** - recollected correctly. K with upward trend to 4.6, Phos trending up to 4.8, Corr Ca 10.4 (Ca x Phos 49, goal <55) Renal: SCr stable, ~3L UOP past 24 hours. Net negative ~19L over past week.  Pulm: procedural intubation 9/10 - extubated 9/12; re-intubated 9/13. FiO2 30% on PRVC.  -9/14 Bronch d/t  mucus plugging in RLL >> unable to completely remove plug with bronch lavage. Plan trach 9/18. Cards: bilateral PE noted on admission likely provoked by lower extremity DVT in the setting of pelvic vein compression - was on heparin drip (currently on hold for increased JP drain output which is bloody - s/p IVC filter 9/15 d/t inability to resume heparin). CT did not show evidence of RV strain. LVEF 55-60%. NSR. BP ok. HR 90-100s.  Hepatobil: LFTs wnl, tbili wnl, TG wnl Neuro: history of MDD, substance abuse disorder, ADD - Medications PTA: buspirone, citalopram, divalproex, gabapentin (agitation), quetiapine, and trazodone. Precedex at 50mcg/kg/hr + Versed at 8mg /hr and prns for sedation. Avoiding continuous opiates with Ogilvie's syndrome. CPOT 0-2. RASS -1 to 2 (goal 0).  ID: zosyn for possible aspiration, WBC up to 10.9, afeb  TPN Access: PICC triple lumen placed 9/11 TPN start date: 9/12 Nutritional Goals (per RD recommendation on 9/17): KCal: 2318 Protein: 142-156 g Fluid: > 2 L  9/16 pm **Dr. [name] requesting dietitian and pharmacy to meet 80% of estimated calorie needs via TPN while maintaining adequate protein.**  Current Nutrition: Vital AF at 41ml/hr + TPN  Plan:  Continue TPN at goal rate of 95 ml/hr  TPN provides 141 gm (~2g/kg) protein and 1882 kcal (~80% estimated kcal needs).  Electrolytes in TPN: decrease K and Phos in TPN, VX:BLTJQZE 1:1 MVI and trace elements in TPN Vit C 500 mg d/t concerns for severe deficiency Pepcid IV in TPN Continue sensitive SSI q6h for now and monitor   Monitor TPN labs. F/u trickle TF tolerance  and ability to advance  Christoper Fabian, PharmD, BCPS Clinical pharmacist  **Pharmacist phone directory can now be found on [email or web address] (PW [email or web address]).  Listed under [facility]. 10/05/2017 8:28 AM

## 2017-10-05 NOTE — Progress Notes (Signed)
Philip Thompson  ZOX:[id number] DOB: January 23, 1990 DOA: 09/21/2017 PCP: [name], MD    LOS: 13 days   Reason for Consult / Chief Complaint:  Pre op acute hypoxemic respiratory failure   Consulting MD and date:  [name]  HPI/Summary of hospital stay:  27 y/o male admitted with colonic inertia/megacolon secondary to constipation and bilateral pulmonary emboli and DVT due to pelvic vein obstruction.  He had acute aspiration pneumonia on 9/10 prior to surgery in the setting of nausea/vomiting.  He went to the OR on 9/10 for total colectomy and ileostomy.    9/12 extubated 9/13 re-intubated 9/14 bleeding from JP prior to starting heparin, ct negative; mucus plugging RLL s/p bronch 9/16 continued bloody drainage from JP, mucous plugs requiring hypertonic nebs and bag lavage 9/17 ongoing agitation/ anxiety; posey and leg restraints added as near self extubation  Subjective:  Plans for trach today at 1 No events overnight.  Remains on versed 8mg /hr and precedex 2 mcg/kg/ hr  Labs being resent for confirmation due to several abnormalities   Objective   Blood pressure (!) 91/55, pulse 83, temperature 98.9 F (37.2 C), temperature source Oral, resp. rate (!) 23, height 5' 7.99" (1.727 m), weight 71.2 kg, SpO2 99 %.    Vent Mode: PRVC FiO2 (%):  [30 %] 30 % Set Rate:  [16 bmp] 16 bmp Vt Set:  [550 mL] 550 mL PEEP:  [5 cmH20] 5 cmH20 Plateau Pressure:  [16 cmH20-26 cmH20] 26 cmH20   Intake/Output Summary (Last 24 hours) at 10/05/2017 8119 Last data filed at 10/05/2017 0600 Gross per 24 hour  Intake 3293.08 ml  Output 2995 ml  Net 298.08 ml   Filed Weights   10/03/17 0431 10/04/17 0500 10/05/17 0416  Weight: 77.3 kg 70.8 kg 71.2 kg    Examination:  General:  Young thin WM on MV, appears comfortable, in bilateral restraints and posey HEENT: MM pink/moist, ETT 7.5 at 22 at lip, NGT left nare, pupils 3/equal /reactive Neuro: sedated, responses to voice, f/c intermittent  commands CV:  rrr, no m/r/g PULM: even/non-labored on MV, lungs bilaterally clear GI:  Hyperactive BS, left JP drain with serosangunious output, midline abd incision/stapled with dressing CDI, soft, left ostomy with stool output  Extremities: warm/dry, no edema  Skin: no rashes  Consults: date of consult/date signed off & final recs:  General surgery 9/6 GI 9/6 Critical care 9/10   Procedures: 9/10 - Total colectomy and ileostomy.  Was found to have a massively distended colon from the cecum all the way to distal rectum and pelvic rim.  No evidence of perforation. Oral endotracheal tube 9/10 >> 9/12.  9/13 >  PICC 9/11>>  ETT 9/10 >> 9/12;  9/13 >> 9/15 retrievable IVC filter   Significant Diagnostic Tests: 9/5 CT chest, abdomen and pelvis: Bilateral PE w/o RHS, diffuse emphysema, massively distended colon and rectum filled with gas and stool. 9/14 CT abdomen/pelvis> subtotal colectomy and ileostomy, fluid filled dilated small bowel loops, likely post operative, though local enteritis not excluded. JP drain in RLQ; rectal wall thickening improevd, worsening right base airspace disease.  Micro Data: Sputum culture 9/10 >> normal flora MRSA PCR 9/10 >> neg  Antimicrobials:  Zosyn (possible aspiration) 9/10 > (stop date 9/19 for 10 days total)   Assessment & Plan:   Acute respiratory failure with hypoxemia due to aspiration pneumonia and mucus plugging the RLL improved 9/15.  Recent failed weaning secondary to ongoing agitation/ anxiety with tachypnea and increased  WOB.   - CXR 9/18 reviewed- ETT good position; OGT side port above diaphragm (not being used currently); continued stable right LLL  atelectasis  P:  - plans for trach today at 1300, given ongoing neuromuscular weakness and poor reserve in the setting of malnutrition - continue full MV support, PRVC 8cc/kg - daily SBT - continue chest PT; aggressive pulm hygiene  - trend CXR - VAP bundle  Thrombocytopenia -  stable P:  - monitor for bleeding  Provoked DVT/PE from bowel obstruction/abdominal compartment syndrome - unable to anticoagulate as he bled from JP on 9/14  - no evidence of RV strain on CT - S/p retrievable filter 9/15 P:  - ideally would retrieve filter in 6-8 weeks if able to tolerate anticoagulation.  Hemorrhagic anemia - bleeding from JP drain but CT did not identify a clear cause; complicated by malnourished state, now resolved P:  - monitor JP output/ for bleeding - trend coags - no anticoagulants for now - Transfuse PRBC for Hgb < 7 gm/dL.  Ogilvie's syndrome s/p colectomy and ileostomy. P:  -  wound care per surgery - bowel regimen; follow ileostomy output - avoiding continuous opiates   Protein calorie malnutrition P:  - continue TPN (initiated 9/12) - NPO for trach, had started trickle TF at 20 via NGT  Anxiety/depression, hx of substance abuse disorder - continue depakote, buspar, and seroquel (added 9/16), and klonopin  Need for sedation while on mechanical ventilation P:  - PAD protocol w/ RASS goal -1 - restraints as needed - continue precedex, wean versed as able, once able to resume PO meds - minimize narcotics given post-op colectomy and ogilvie syndrome - continue enteral oxycodone added 9/17  Hypothyroidism  P:  Continue levothyroxine IV for now  Disposition / Summary of Today's Plan 10/05/17   Plan for tracheostomy today    Feeding: TPN, trickle tube feeds- currently NPO for trach Analgesia: precedex, enteral oxy Sedation: RASS goal -1, versed, precedex, seroquel (lowered from home regimen), klonopin, buspar, depakote,  Thromboprophylaxis: holding heparin/ IVC filtrer HOB >30 degrees Ulcer prophylaxis: famotidine Glucose control: CBG q 4, SSI  Family: Father, Mother, and sister updated at bedside.   Labs   CBC: Recent Labs  Lab 09/29/17 0520  10/01/17 0530  10/02/17 0629 10/02/17 1030 10/02/17 2018 10/03/17 0329 10/05/17 0531   WBC 4.4   < > 7.2   < > 7.4 8.3 7.6 7.6 10.4  NEUTROABS 2.1  --  4.6  --   --   --   --  5.4  --   HGB 8.0*   < > 8.8*   < > 10.9* 11.1* 10.4* 10.4* 10.7*  HCT 25.6*   < > 26.4*   < > 32.5* 33.3* 32.4* 32.0* 34.0*  MCV 88.3   < > 86.0   < > 87.4 87.4 92.8 91.4 94.2  PLT 101*   < > 115*   < > 131* 125* 111* 126* 156   < > = values in this interval not displayed.   Basic Metabolic Panel: Recent Labs  Lab 09/30/17 0854 10/01/17 0530 10/01/17 1330 10/02/17 0629 10/03/17 0600 10/04/17 0638 10/05/17 0531  NA 137 140 140 142 139 143 131*  K 3.2* 3.3* 4.6 3.8 3.8 4.2 >7.5*  CL 99 105 107 110 108 114* 103  CO2 29 27 27 24 24 22  20*  GLUCOSE 122* 132* 136* 121* 118* 164* 754*  BUN 5* 6 6 5* 7 9 14   CREATININE 0.53* 0.46* 0.44*  0.45* 0.42* 0.54* 0.61  CALCIUM 8.3* 7.9* 8.1* 8.3* 8.2* 8.1* 8.2*  MG 1.9 1.9  --  1.9 1.9  --  2.5*  PHOS 2.4* 2.9  --  3.0 3.8  --  8.6*   GFR: Estimated Creatinine Clearance: 134.2 mL/min (by C-G formula based on SCr of 0.61 mg/dL). Recent Labs  Lab 10/02/17 1030 10/02/17 2018 10/03/17 0329 10/05/17 0531  WBC 8.3 7.6 7.6 10.4   Liver Function Tests: Recent Labs  Lab 09/29/17 0520 10/03/17 0600  AST 21 15  ALT 22 19  ALKPHOS 50 75  BILITOT 0.8 0.3  PROT 3.9* 5.0*  ALBUMIN 1.9* 1.9*   No results for input(s): LIPASE, AMYLASE in the last 168 hours. No results for input(s): AMMONIA in the last 168 hours. ABG    Component Value Date/Time   PHART 7.464 (H) 09/30/2017 1107   PCO2ART 36.8 09/30/2017 1107   PO2ART 60.0 (L) 09/30/2017 1107   HCO3 26.3 09/30/2017 1107   TCO2 27 09/30/2017 1107   ACIDBASEDEF 1.0 09/27/2017 1551   O2SAT 92.0 09/30/2017 1107    Coagulation Profile: Recent Labs  Lab 10/02/17 1030  INR 1.02   Cardiac Enzymes: No results for input(s): CKTOTAL, CKMB, CKMBINDEX, TROPONINI in the last 168 hours. HbA1C: Hgb A1c MFr Bld  Date/Time Value Ref Range Status  03/27/2017 06:54 AM 5.4 4.8 - 5.6 % Final    Comment:     (NOTE) Pre diabetes:          5.7%-6.4% Diabetes:              >6.4% Glycemic control for   <7.0% adults with diabetes   07/02/2016 07:34 AM 5.4 4.8 - 5.6 % Final    Comment:    (NOTE)         Pre-diabetes: 5.7 - 6.4         Diabetes: >6.4         Glycemic control for adults with diabetes: <7.0    CBG: Recent Labs  Lab 10/04/17 1102 10/04/17 1543 10/04/17 1906 10/04/17 2317 10/05/17 0339  GLUCAP 121* 109* 109* 100* 100*     CC time:  45  Posey Boyer, AGACNP-BC [facility] Pgr: [phone] or if no answer [phone] 10/05/2017, 11:53 AM

## 2017-10-05 NOTE — Progress Notes (Addendum)
Cortrak Tube Team Note:  Consult received to place a Cortrak feeding tube.   A 10 F Cortrak tube was placed in the RIGHT nare and secured with a nasal bridle at 84 cm. Per the Cortrak monitor reading the tube tip is post-pyloric.   No x-ray is required. RN may begin using tube.   If the tube becomes dislodged please keep the tube and contact the Cortrak team at [email or web address] (password [email or web address]) for replacement.  If after hours and replacement cannot be delayed, place a NG tube and confirm placement with an abdominal x-ray.   Romelle Starcher MS, RD, LDN, CNSC [phone] Pager  [phone] Weekend/On-Call Pager

## 2017-10-05 NOTE — Progress Notes (Addendum)
8 Days Post-Op     Subjective: Still taking large amount of sedation.  Ostomy is working.  TF is off, for tracheostomy, but TF is up to 20 ml/hr.    Objective: Vital signs in last 24 hours: Temp:  [98.9 F (37.2 C)-99.5 F (37.5 C)] 99 F (37.2 C) (09/18 0742) Pulse Rate:  [77-128] 97 (09/18 0800) Resp:  [18-36] 18 (09/18 0800) BP: (82-119)/(41-70) 101/61 (09/18 0800) SpO2:  [88 %-100 %] 96 % (09/18 0800) FiO2 (%):  [30 %] 30 % (09/18 0742) Weight:  [71.2 kg] 71.2 kg (09/18 0416) Last BM Date: 10/04/17 2560 IV 670 per FT 1585 urine Drain 330 Stool ileostomy - 1080 Afebrile, VSS WBC stable 10.9 H/H stable K+ >7.5 has been redrawn and repeat is pending Glucose from that sample was 754   Intake/Output from previous day: 09/17 0701 - 09/18 0700 In: 3293.1 [I.V.:2560.4; NG/GT:670; IV Piggyback:62.6] Out: 2995 [Urine:1585; Drains:330; Stool:1080] Intake/Output this shift: Total I/O In: 3 [I.V.:3] Out: 430 [Urine:100; Drains:55; Stool:275]  General appearance: alert and sedated but aware of what is going on.   Resp: clear to auscultation bilaterally and full vent support with sedation, for tracheostomy later today GI: soft, sore, incision looks fine, ostomy working well.  Drain is serosangunious, large clot in bulb.  Lab Results:  Recent Labs    10/03/17 0329 10/05/17 0531  WBC 7.6 10.4  HGB 10.4* 10.7*  HCT 32.0* 34.0*  PLT 126* 156    BMET Recent Labs    10/04/17 0638 10/05/17 0531  NA 143 131*  K 4.2 >7.5*  CL 114* 103  CO2 22 20*  GLUCOSE 164* 754*  BUN 9 14  CREATININE 0.54* 0.61  CALCIUM 8.1* 8.2*   PT/INR Recent Labs    10/02/17 1030  LABPROT 13.4  INR 1.02    Recent Labs  Lab 09/29/17 0520 10/03/17 0600  AST 21 15  ALT 22 19  ALKPHOS 50 75  BILITOT 0.8 0.3  PROT 3.9* 5.0*  ALBUMIN 1.9* 1.9*     Lipase     Component Value Date/Time   LIPASE 37 09/21/2017 2237     Medications: . albuterol  2.5 mg Nebulization Q4H  .  busPIRone  5 mg Per Tube TID  . chlorhexidine gluconate (MEDLINE KIT)  15 mL Mouth Rinse BID  . Chlorhexidine Gluconate Cloth  6 each Topical Daily  . clonazePAM  1 mg Per Tube BID  . etomidate  40 mg Intravenous Once  . feeding supplement (VITAL AF 1.2 CAL)  1,000 mL Per Tube Q24H  . fentaNYL (SUBLIMAZE) injection  200 mcg Intravenous Once  . insulin aspart  0-9 Units Subcutaneous Q6H  . levothyroxine  37.5 mcg Intravenous Daily  . mouth rinse  15 mL Mouth Rinse 10 times per day  . midazolam  4 mg Intravenous Once  . oxyCODONE  5 mg Per Tube Q6H  . QUEtiapine  50 mg Per Tube BID  . sodium chloride flush  10-40 mL Intracatheter Q12H  . sodium chloride flush  3 mL Intravenous Q12H  . valproic acid  500 mg Per Tube BID  . vecuronium  10 mg Intravenous Once   . dexmedetomidine (PRECEDEX) IV infusion 2 mcg/kg/hr (10/05/17 0543)  . midazolam (VERSED) infusion 8 mg/hr (10/05/17 0035)  . piperacillin-tazobactam (ZOSYN)  IV 3.375 g (10/05/17 0228)  . TPN ADULT (ION) 95 mL/hr at 10/04/17 1800   Assessment/Plan History ADD, anxiety, depression, polysubstance abuse- CCM managing Protein-calorie malnutirition - TNA/trickle Tube feeding   being advanced Acute blood loss anemia and anemia of chronic disease- 1,000cc sanguinous drainage in JP on 9/12;transfused pRBC and FFP 9/12;  Acute hypoxic respiratory failure, aspiration PNA - per CCM, extubated 9/12- reintubated 09/30/17  - will need tracheostomy - planned for 10/05/17 AcutebilateralPE - perCCM, holding heparin due to bloody high output JP Emesis and possible aspiration Post op bleeding transfused multiple times, 2 units of PRBC 9/14, last transfusion  Colonic inertia Megacolon with rectal fecal impaction POD#8S/P total abdominal colectomy, ileostomy, Dr. Ninfa Linden, 09/27/17 -NG 7,000 cc/24h, continue NG tube to LIWS - starting trickle tube feeds>> up to 20 ml/hr when feeds on - continue JP, 330 cc/24h, follow ouput - await  return of bowel function   FEN:NPO, NGT, TPN VTE: SCD's,holdingheparin PT:WSFKC 09/10>> day 8 - low grade fever Foley:placed 9/10 Follow up:Dr. Ninfa Linden   Plan:  Doing well from surgical standpoint, labs being repeated, for tracheostomy later today.    LOS: 13 days    Lars Jeziorski 10/05/2017 [phone]

## 2017-10-05 NOTE — Procedures (Signed)
Percutaneous Tracheostomy Placement  Consent from family.  Patient sedated, paralyzed and position.  Placed on 100% FiO2 and RR matched.  Area cleaned and draped.  Lidocaine/epi injected.  Skin incision done followed by blunt dissection.  Trachea palpated then punctured, catheter passed and visualized bronchoscopically.  Wire placed and visualized.  Catheter removed.  Airway then entered and dilated with some difficulty.  Size 6 cuffed shiley trach placed and visualized bronchoscopically well above carina.  Good volume returns.  Patient tolerated the procedure well without complications.  Minimal blood loss.  CXR ordered and pending.  Performed by Dr. [name] and myself  Alyson Reedy, M.D. [facility] Medicine. Pager: [phone]. After hours pager: [phone].

## 2017-10-05 NOTE — Progress Notes (Signed)
bronchoscopy at bedside

## 2017-10-06 ENCOUNTER — Ambulatory Visit (HOSPITAL_COMMUNITY): Payer: Self-pay | Admitting: Licensed Clinical Social Worker

## 2017-10-06 ENCOUNTER — Inpatient Hospital Stay (HOSPITAL_COMMUNITY): Payer: BLUE CROSS/BLUE SHIELD

## 2017-10-06 DIAGNOSIS — Z029 Encounter for administrative examinations, unspecified: Secondary | ICD-10-CM

## 2017-10-06 LAB — GLUCOSE, CAPILLARY
GLUCOSE-CAPILLARY: 104 mg/dL — AB (ref 70–99)
GLUCOSE-CAPILLARY: 88 mg/dL (ref 70–99)
Glucose-Capillary: 121 mg/dL — ABNORMAL HIGH (ref 70–99)
Glucose-Capillary: 112 mg/dL — ABNORMAL HIGH (ref 70–99)
Glucose-Capillary: 106 mg/dL — ABNORMAL HIGH (ref 70–99)
Glucose-Capillary: 100 mg/dL — ABNORMAL HIGH (ref 70–99)

## 2017-10-06 LAB — PHOSPHORUS: Phosphorus: 4.3 mg/dL (ref 2.5–4.6)

## 2017-10-06 LAB — MAGNESIUM: MAGNESIUM: 1.8 mg/dL (ref 1.7–2.4)

## 2017-10-06 LAB — CBC
HEMATOCRIT: 33.8 % — AB (ref 39.0–52.0)
Hemoglobin: 10.5 g/dL — ABNORMAL LOW (ref 13.0–17.0)
MCH: 28.2 pg (ref 26.0–34.0)
MCHC: 31.1 g/dL (ref 30.0–36.0)
MCV: 90.6 fL (ref 78.0–100.0)
Platelets: 162 10*3/uL (ref 150–400)
RBC: 3.73 MIL/uL — ABNORMAL LOW (ref 4.22–5.81)
RDW: 15.1 % (ref 11.5–15.5)
WBC: 11.2 10*3/uL — AB (ref 4.0–10.5)

## 2017-10-06 LAB — COMPREHENSIVE METABOLIC PANEL
ALBUMIN: 1.9 g/dL — AB (ref 3.5–5.0)
ALT: 39 U/L (ref 0–44)
AST: 21 U/L (ref 15–41)
Alkaline Phosphatase: 89 U/L (ref 38–126)
Anion gap: 8 (ref 5–15)
BUN: 20 mg/dL (ref 6–20)
CHLORIDE: 107 mmol/L (ref 98–111)
CO2: 23 mmol/L (ref 22–32)
CREATININE: 0.55 mg/dL — AB (ref 0.61–1.24)
Calcium: 7.8 mg/dL — ABNORMAL LOW (ref 8.9–10.3)
GFR calc Af Amer: >60 mL/min (ref >60)
GLUCOSE: 116 mg/dL — AB (ref 70–99)
Potassium: 4 mmol/L (ref 3.5–5.1)
Sodium: 138 mmol/L (ref 135–145)
Total Bilirubin: 0.4 mg/dL (ref 0.3–1.2)
Total Protein: 5.2 g/dL — ABNORMAL LOW (ref 6.5–8.1)

## 2017-10-06 MED ORDER — TRAVASOL 10 % IV SOLN
INTRAVENOUS | Status: DC
  Filled 2017-10-06: qty 1413.6

## 2017-10-06 MED ORDER — GABAPENTIN 300 MG PO CAPS: 300.0000 mg | ORAL_CAPSULE | Freq: Two times a day (BID) | ORAL | Status: DC

## 2017-10-06 MED ORDER — QUETIAPINE FUMARATE 50 MG PO TABS
50.0000 mg | ORAL_TABLET | Freq: Once | ORAL | Status: AC
  Administered 2017-10-06: 50 mg
  Filled 2017-10-06: qty 1

## 2017-10-06 MED ORDER — VITAL AF 1.2 CAL PO LIQD
1000.0000 mL | ORAL | Status: DC
  Administered 2017-10-06: 1000 mL
  Filled 2017-10-06: qty 1000

## 2017-10-06 MED ORDER — QUETIAPINE FUMARATE 100 MG PO TABS
100.0000 mg | ORAL_TABLET | Freq: Two times a day (BID) | ORAL | Status: DC
  Administered 2017-10-06: 100 mg
  Filled 2017-10-06: qty 1

## 2017-10-06 MED ORDER — DEXMEDETOMIDINE HCL IN NACL 400 MCG/100ML IV SOLN: 0.0000 ug/kg/h | INTRAVENOUS | Status: DC

## 2017-10-06 MED ORDER — LEVOTHYROXINE SODIUM 50 MCG PO TABS: 50.0000 ug | ORAL_TABLET | Freq: Every day | ORAL | Status: DC

## 2017-10-06 MED ORDER — DEXMEDETOMIDINE HCL IN NACL 400 MCG/100ML IV SOLN
0.4000 ug/kg/h | INTRAVENOUS | Status: DC
  Administered 2017-10-06 – 2017-10-07 (×9): 2 ug/kg/h via INTRAVENOUS
  Filled 2017-10-06 (×3): qty 100
  Filled 2017-10-06: qty 200
  Filled 2017-10-06: qty 100
  Filled 2017-10-06 (×2): qty 200

## 2017-10-06 MED ORDER — DEXMEDETOMIDINE HCL IN NACL 400 MCG/100ML IV SOLN
0.0000 ug/kg/h | INTRAVENOUS | Status: AC
  Filled 2017-10-06: qty 100

## 2017-10-06 MED ORDER — GABAPENTIN 250 MG/5ML PO SOLN
300.0000 mg | Freq: Two times a day (BID) | ORAL | Status: DC
  Administered 2017-10-06 – 2017-10-21 (×29): 300 mg
  Filled 2017-10-06 (×33): qty 6

## 2017-10-06 MED ORDER — MAGNESIUM SULFATE 2 GM/50ML IV SOLN
2.0000 g | Freq: Once | INTRAVENOUS | Status: AC
  Administered 2017-10-06: 2 g via INTRAVENOUS
  Filled 2017-10-06: qty 50

## 2017-10-06 MED ORDER — TRAZODONE HCL 50 MG PO TABS
50.0000 mg | ORAL_TABLET | Freq: Every day | ORAL | Status: DC
  Administered 2017-10-06: 50 mg via ORAL
  Filled 2017-10-06: qty 1

## 2017-10-06 MED ORDER — LEVOTHYROXINE SODIUM 75 MCG PO TABS
75.0000 ug | ORAL_TABLET | Freq: Every day | ORAL | Status: DC
  Administered 2017-10-07 – 2017-10-17 (×11): 75 ug
  Filled 2017-10-06 (×13): qty 1

## 2017-10-06 MED ORDER — CLONAZEPAM 1 MG PO TABS
2.0000 mg | ORAL_TABLET | Freq: Two times a day (BID) | ORAL | Status: DC
  Administered 2017-10-06 – 2017-10-10 (×9): 2 mg
  Filled 2017-10-06 (×11): qty 2

## 2017-10-06 NOTE — Progress Notes (Signed)
PHARMACY - ADULT TOTAL PARENTERAL NUTRITION CONSULT NOTE   Pharmacy Consult for TPN Indication: massive bowel resection  Patient Measurements: Height: 5' 7.99" (172.7 cm) Weight: 151 lb 14.4 oz (68.9 kg) IBW/kg (Calculated) : 68.38  TPN AdjBW (KG): 77.8 Body mass index is 23.1 kg/m. Usual Weight: 87 kg  Assessment:  [name] admitted on 9/4 with complete colonic inertia, megacolon and severe obstructive constipation. Pt indicates poor intake and weight loss PTA over 1 month time frame. 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 Was found to have a massively distended colon from the cecum all the way to distal rectum and pelvic rim.  No evidence of perforation. Pharmacy has been consulted to start TPN for severe malnutrition related to chronic illness (cardiomyopathy, substance abuse, chronic constipation/obstruction with megacolon) associated with severe muscle and fat depletion and edema.  GI: Ogilvie's syndrome - s/p colectomy and ileostomy 9/10. NPO 9 days (since admission) with poor oral intake PTA. TPN initiated 9/12. JP drain output 265 ml over last 24 hr. Wt 152 lb - down significantly (~55 lbs) since admission; down slightly over last 24 hours. LBM 9/18. Pre-albumin improved from <5 to 13.3. CT abd neg 9/14 for new acute process. 9/16 Pt started on trickle TF (Vital AF 1.2 at 65ml/hr). Paused when trached on 9/18. Spoke with RN and pt tolerating TF overnight started at [facility] 9/18.    Endo: hypothyroidism (TSH 6.6 and T4 0.62 on 9/1) - continue levothyroxine. CBGs at goal. Insulin requirements in the past 24 hours: 1 unit SSI Lytes: K down from 4.6 yesterday to 4 today, Phos down from 4.8 yesterday to 4.3 today, Corr Ca 9.5 Renal: SCr stable, ~1.5L UOP past 24 hours. Net negative ~18L over past week.  Pulm: procedural intubation 9/10 - extubated 9/12; re-intubated 9/13. FiO2 30% on PRVC.  -9/14 Bronch d/t mucus plugging in RLL >> unable to completely  remove plug with bronch lavage. Trached on 9/18. Cards: bilateral PE noted on admission likely provoked by lower extremity DVT in the setting of pelvic vein compression - was on heparin drip (currently on hold for increased JP drain output which is bloody - s/p IVC filter 9/15 d/t inability to resume heparin). CT did not show evidence of RV strain. LVEF 55-60%. NSR. BP slightly hypotensive but stable. HR 90-100s.  Hepatobil: LFTs wnl, tbili wnl, TG wnl Neuro: history of MDD, substance abuse disorder, ADD - Medications PTA: buspirone, citalopram, divalproex, gabapentin (agitation), quetiapine, and trazodone. Precedex at 59mcg/kg/hr + Versed at 8mg /hr and prns for sedation. Avoiding continuous opiates with Ogilvie's syndrome. CPOT 0-2. RASS -1 to 2 (goal 0).  ID: zosyn for possible aspiration, WBC up to 11.2, afeb  TPN Access: PICC triple lumen placed 9/11 TPN start date: 9/12 Nutritional Goals (per RD recommendation on 9/17): KCal: 2318 Protein: 142-156 g Fluid: > 2 L  9/16 pm **Dr. [name] requesting dietitian and pharmacy to meet 80% of estimated calorie needs via TPN while maintaining adequate protein.**  Current Nutrition: Vital AF at 57ml/hr + TPN  Plan:  Continue TPN at goal rate of 95 ml/hr  TPN provides 141 gm (~2g/kg) protein and 1882 kcal (~80% estimated kcal needs).  Electrolytes in TPN: Continue same.  MVI and trace elements in TPN Vit C 500 mg d/t concerns for severe deficiency Pepcid IV in TPN Continue sensitive SSI q6h for now and monitor   Monitor TPN labs. F/u trickle TF tolerance and ability to advance  Mariella Saa,  PharmD Student   **Pharmacist phone directory can now be found on [email or web address] (PW [email or web address]).  Listed under [facility]. 10/06/2017 10:50 AM

## 2017-10-06 NOTE — Progress Notes (Addendum)
Nutrition Follow-up  DOCUMENTATION CODES:   Severe malnutrition in context of chronic illness  INTERVENTION:   Continue current TPN per Pharmacy;  Plan to advance Vital AF 1.2 to 30 ml/hr, order just entered. This afternoon will mark 24 consecutive hrs pt on TF. Continue to monitor tolerance and ability to keep continuous feedings going before adjusting TPN.   Recommend advancing TF via Cortrak tube to goal Vital AF 1.2 @ 80 ml/hr  Provides: 2304 kcal, 144 grams protein, and 1557 ml free water.   NUTRITION DIAGNOSIS:   Severe Malnutrition related to chronic illness(cardiomyopathy, substance abuse, chronic constipation/obstipation with megacolon) as evidenced by severe fat depletion, severe muscle depletion. Ongoing.   GOAL:   Patient will meet greater than or equal to 90% of their needs Met with TNA and trickle TF  MONITOR:   TF tolerance, I & O's, Labs, Skin, Vent status, Diet advancement  ASSESSMENT:   27 yo male admitted 9/4 for megacolon and severe obstructive constipation with severely dilated colon and colitis. Noted pt admitted to [facility] at the end of August with constipation x 3 weeks with megacolon with medical therapy attempts, discharged on 9/2. Pt also with chest pain with bilateral PE on [place] includes polysubstance abuse, drug-induced cardiomyopathy, anxiety, chronic obstipation/constipation  9/10 Total colectomy with ileostomy, Vent 9/12 TPN initiated, Extubated 9/13 Re-Intubated 9/16 Trickle feedings started but held overnight due to inability to maintain HOB > 30 degrees 9/17 Trickle feedings restarted in am but held at midnight for trach 9/18 trach placed, Cortrak placed (tip post-pyloric) and trickle TF resumed  Spoke with pt's sister who is from [place]. She has not seen him in 2 years. Per sister pt has been living with his dad who has medical problems and really has no consistent help. She is concerned about what will happen when she leaves. Per  sister he was previously on high doses of his psych medications. They have lost 2 siblings in the last two years and has been hard on both the patient and the sister. She suspects pt has been doing drugs/alcohol but not "the hard stuff".   Spoke with NP, will advance TF to 30 ml/hr today.   Patient is currently on ventilator support via trach MV: 14.1 L/min Temp (24hrs), Avg:99.8 F (37.7 C), Min:98.5 F (36.9 C), Max:102.1 F (38.9 C)   Output over last 24 hr: Ileostomy: 950 ml  JP drain: 295 ml  UOP: 1190 ml   Medications reviewed and include: 0-9 units SSI every 6 hours, 50 mcg synthroid  Labs reviewed: K+/PO4/Magnesium WNL with repletion  CBG's: 112-106  Cortrak: (tip post-pyloric):  Vital AF 1.2 @ 20 ml/hr  Provides:  576 kcal, 36 grams protein, and 389 ml free water per 24 hr period  Triple Lumen PICC:  TNA @ 95 ml/hr  Provides: 1882 kcal (81% of needs) and 141 grams protein (2 grams/kg IBW) In addition TNA providing MVI, trace elements, Vitamin C 500 mg, Pepcid    NUTRITION - FOCUSED PHYSICAL EXAM: Exam repeated, swelling in BLE has decreased    Most Recent Value  Orbital Region  Severe depletion  Upper Arm Region  Severe depletion  Thoracic and Lumbar Region  Severe depletion  Buccal Region  Severe depletion  Temple Region  Severe depletion  Clavicle Bone Region  Severe depletion  Clavicle and Acromion Bone Region  Severe depletion  Scapular Bone Region  Severe depletion  Dorsal Hand  Moderate depletion  Patellar Region  Moderate depletion  Anterior Thigh  Region  Moderate depletion  Posterior Calf Region  Moderate depletion  Edema (RD Assessment)  Mild  Hair  Reviewed  Eyes  Reviewed  Mouth  Unable to assess  Skin  Reviewed  Nails  Reviewed [long toenails and fingernails, unkempt]       Diet Order:   Diet Order            Diet NPO time specified  Diet effective midnight              EDUCATION NEEDS:   Not appropriate for education at this  time  Skin:  Skin Assessment: Skin Integrity Issues: Skin Integrity Issues:: Incisions Incisions: abdomen  Last BM:  stool via ileostomy   Height:   Ht Readings from Last 1 Encounters:  09/27/17 5' 7.99" (1.727 m)    Weight:   Wt Readings from Last 1 Encounters:  10/06/17 68.9 kg    Ideal Body Weight:  70 kg  BMI:  Body mass index is 23.1 kg/m.  Estimated Nutritional Needs:   Kcal:  2318 kcals   Protein:  142-156 g  Fluid:  >/= 2 L  Maylon Peppers RD, LDN, CNSC [phone] Pager [phone] After Hours Pager

## 2017-10-06 NOTE — Consult Note (Signed)
[place] Nurse ostomy follow up Met patient's sister who is a Marine scientist and patient's mother at the bedside today. Taijuan is mostly sedated and now has a new tracheostomy. He has a new Cortrac tube in place. He does arouse to stimulus but is not able to participate in any education at this time.  Stoma type/location: RLQ, ileostomy Stomal assessment/size: 1 3/8" round budded, moist and pink Peristomal assessment: intact  Treatment options for stomal/peristomal skin: using 2" barrier ring to protect skin from acidic effluent Output liquid brown Ostomy pouching: 1pc convex with 2" barrier ring placed around the stoma.  Education provided to sister and patient's mother Explained creation of stoma  Explained stoma characteristics (budded, flush, color, texture, care) Demonstrated pouch change (cutting new skin barrier, measuring stoma, cleaning peristomal skin and stoma, use of barrier ring) Discussed use of wick to clean spout     Enrolled patient in [employer] Discharge program: No  WOC Nurse will follow along with you for continued support with ostomy teaching and care Dana MSN, [place], [place], [place], [place]

## 2017-10-06 NOTE — Progress Notes (Signed)
Philip Thompson  UUV:[id number] DOB: 1990/10/15 DOA: 09/21/2017 PCP: [name], MD    LOS: 14 days   Reason for Consult / Chief Complaint:  Pre op acute hypoxemic respiratory failure   Consulting MD and date:  [name]  HPI/Summary of hospital stay:  27 y/o male admitted with colonic inertia/megacolon secondary to constipation and bilateral pulmonary emboli and DVT due to pelvic vein obstruction.  He had acute aspiration pneumonia on 9/10 prior to surgery in the setting of nausea/vomiting.  He went to the OR on 9/10 for total colectomy and ileostomy.    9/12 extubated 9/13 re-intubated 9/14 bleeding from [place] prior to starting heparin, ct negative; mucus plugging RLL s/p bronch 9/16 continued bloody drainage from JP, mucous plugs requiring hypertonic nebs and bag lavage 9/17 ongoing agitation/ anxiety; posey and leg restraints added as near self extubation 9/18 Trached; cortrak placed  Subjective:  No events overnight.  Remains on versed 10 mg/hr and precedex 2 mcg/kg/ hr for ongoing agitation Tmax yest 102.1, WBC mildly elevated 11.2 SBP remain soft, MAPs remain > 65, UOP 0.7 ml/kg/hr  Objective   Blood pressure (!) 89/54, pulse 96, temperature 99.6 F (37.6 C), temperature source Oral, resp. rate (!) 26, height 5' 7.99" (1.727 m), weight 68.9 kg, SpO2 100 %.    Vent Mode: PRVC FiO2 (%):  [30 %-80 %] 40 % Set Rate:  [16 bmp-22 bmp] 22 bmp Vt Set:  [550 mL] 550 mL PEEP:  [5 cmH20-8 cmH20] 8 cmH20 Plateau Pressure:  [15 cmH20-21 cmH20] 21 cmH20   Intake/Output Summary (Last 24 hours) at 10/06/2017 0903 Last data filed at 10/06/2017 0600 Gross per 24 hour  Intake 3656.34 ml  Output 2005 ml  Net 1651.34 ml   Filed Weights   10/04/17 0500 10/05/17 0416 10/06/17 0500  Weight: 70.8 kg 71.2 kg 68.9 kg    Examination:  General:  Chronically ill young male in NAD on MV HEENT: MM pink/moist, midline trach 6 cuffed with empiric foam dressing, right nare cortrak, pupils 4/  reactive Neuro: sleepy, arouses to verbal, f/c, MAE CV: SR, rrr, no m/r/g PULM: even/non-labored on MV, mildly tachypneic,  lungs bilaterally clear throughout, scant secretions [id number], +BS, left JP with dark serosanguinous output, R ostomy with liquid stool output, midline abd incision dry/stapled  Extremities: warm/dry, mild +1 UE edema, BLE without erythema/ warmth Skin: no rashes   Consults: date of consult/date signed off & final recs:  General surgery 9/6 GI 9/6 Critical care 9/10   Procedures: 9/10 - Total colectomy and ileostomy.  Was found to have a massively distended colon from the cecum all the way to distal rectum and pelvic rim.  No evidence of perforation. Oral endotracheal tube 9/10 >> 9/12.  9/13 >  PICC 9/11>>  ETT 9/10 >> 9/12;  9/13 >>9/19 Retrievable IVC filter 9/15 6 shiley trach cuffed 9/18 >>  Significant Diagnostic Tests: 9/5 CT chest, abdomen and pelvis: Bilateral PE w/o RHS, diffuse emphysema, massively distended colon and rectum filled with gas and stool. 9/14 CT abdomen/pelvis> subtotal colectomy and ileostomy, fluid filled dilated small bowel loops, likely post operative, though local enteritis not excluded. JP drain in RLQ; rectal wall thickening improevd, worsening right base airspace disease.  Micro Data: Sputum culture 9/10 >> normal flora MRSA PCR 9/10 >> neg BAL 9/18 >>  Antimicrobials:  Zosyn (possible aspiration) 9/10 > (stop date 9/19 for 10 days total)  Assessment & Plan:   Acute respiratory failure with hypoxemia due  to aspiration pneumonia and mucus plugging the RLL improved 9/15.  Failed multiple SBT 2/2 ongoing agitation with increased WOB/ malnutrition.  Trached 9/18 followed by RUL mucous plugging/ collapse, improved with CPT/ RUL better but then RML collapse - CXR 9/19 reviewed- trach/ feed tube good position; improved aeration of RML but residual atelectasis - currently on 40%/ 8 peep with minimal secretions this am  P:  -  continue full MV support, PRVC 8cc/kg - wean FiO2 as permits/ continue PEEP 8 - daily SBT - continue chest PT with vest;  aggressive pulm hygiene, consult PT, OOB to chair if able, ideally right lung up - BD q 4 - trend CXR - VAP bundle  Thrombocytopenia - stable - no evidence of bleeding; clot noted in JP drain P:  - ongoing monitoring  Provoked DVT/PE from bowel obstruction/abdominal compartment syndrome - unable to anticoagulate as he bled from [place] on 9/14  - no evidence of RV strain on CT - S/p retrievable filter 9/15 P:  - ideally would retrieve filter in 6-8 weeks if able to tolerate anticoagulation. - defer restarting heparin systemically to CCS; ok from PCCM standpoint  Hemorrhagic anemia - bleeding from JP drain but CT did not identify a clear cause; complicated by malnourished state, now resolved - slight drift in Hgb, but still 10.5 P:  - monitor JP output/ for bleeding - trending coags - Transfuse PRBC for Hgb < 7 gm/dL.  Ogilvie's syndrome s/p colectomy and ileostomy. P:  -  wound care per surgery - bowel regimen; follow ileostomy output- adequate currently - ideally avoid opiates for sedation as able  Protein calorie malnutrition - s/p cortrak 9/18 P:  - continue TPN (initiated 9/12) - continue trickle TF at 20 via NGT - Mag 2 gm now   Anxiety/depression, hx of substance abuse disorder - continue depakote, buspar - increase klonopin from 1 to 49m BID, and hopeful to wean versed/precedex  - increase seroquel from 50 to 1070mBID, QTc 0.39 - daily QTc monitoring - restart home neurontin, lower dose, 30046mID  Need for sedation while on mechanical ventilation - ideally should be able to wean with comfort of trach P:  - PAD protocol w/ RASS goal 0/-1 - wean precedex / versed gtt as able, given changes as above with home pysch meds - restraints as needed for safetly - minimize narcotics given post-op colectomy and ogilvie syndrome - continue enteral  oxycodone, low dose 5mg13m6hr  Hypothyroidism  - 9/1 TSH 6.624 , free T4 0.62 - restarted on higher home dose P:  Change levothyroxine from IV to PO at home dose Will need repeat levels in ideally 4-6 weeks (~10/10)  Fever once 9/18 - with mild increase in leukocytosis, likely stress response to trach/ bronch P:  Follow BAL sent 9/18 Zosyn as above Previous trach asp negative If reoccurs, consider BC's  Disposition / Summary of Today's Plan 10/06/17   Adjusting psych meds to improve agitation; wean IV sedation Aggressive bronch hygiene- monitor RML atelectasis, hopeful to get to chair if safe from agitation standpoint    Feeding: TPN, trickle tube feeds Analgesia: precedex, enteral oxy Sedation: RASS goal -1, versed, precedex, seroquel, klonopin, buspar, depakote, Neurontin  Thromboprophylaxis: IVC filtrer/ awaiting to hear from surgery about restarting systemic heparin HOB >30 degrees Ulcer prophylaxis: famotidine Glucose control: CBG q 4, SSI  Family: Mother and sister updated at bedside.   Labs   CBC: Recent Labs  Lab 10/01/17 0530  10/02/17 2018 10/03/17 0329  10/05/17 0531 10/05/17 0833 10/06/17 0445  WBC 7.2   < > 7.6 7.6 10.4 10.9* 11.2*  NEUTROABS 4.6  --   --  5.4  --  8.0*  --   HGB 8.8*   < > 10.4* 10.4* 10.7* 11.5* 10.5*  HCT 26.4*   < > 32.4* 32.0* 34.0* 36.9* 33.8*  MCV 86.0   < > 92.8 91.4 94.2 91.6 90.6  PLT 115*   < > 111* 126* 156 189 162   < > = values in this interval not displayed.   Basic Metabolic Panel: Recent Labs  Lab 10/02/17 0629 10/03/17 0600 10/04/17 0638 10/05/17 0531 10/05/17 0834 10/06/17 0415  NA 142 139 143 131* 140 138  K 3.8 3.8 4.2 >7.5* 4.6 4.0  CL 110 108 114* 103 107 107  CO2 _0 20* 23 23  GLUCOSE 121* 118* 164* 754* 104* 116*  BUN 5* _1 CREATININE 0.45* 0.42* 0.54* 0.61 0.57* 0.55*  CALCIUM 8.3* 8.2* 8.1* 8.2* 8.7* 7.8*  MG 1.9 1.9  --  2.5* 2.0 1.8  PHOS 3.0 3.8  --  8.6* 4.8* 4.3    GFR: Estimated Creatinine Clearance: 134.2 mL/min (A) (by C-G formula based on SCr of 0.55 mg/dL (L)). Recent Labs  Lab 10/03/17 0329 10/05/17 0531 10/05/17 0833 10/06/17 0445  WBC 7.6 10.4 10.9* 11.2*   Liver Function Tests: Recent Labs  Lab 10/03/17 0600 10/06/17 0415  AST 15 21  ALT 19 39  ALKPHOS 75 89  BILITOT 0.3 0.4  PROT 5.0* 5.2*  ALBUMIN 1.9* 1.9*   No results for input(s): LIPASE, AMYLASE in the last 168 hours. No results for input(s): AMMONIA in the last 168 hours. ABG    Component Value Date/Time   PHART 7.464 (H) 09/30/2017 1107   PCO2ART 36.8 09/30/2017 1107   PO2ART 60.0 (L) 09/30/2017 1107   HCO3 26.3 09/30/2017 1107   TCO2 27 09/30/2017 1107   ACIDBASEDEF 1.0 09/27/2017 1551   O2SAT 92.0 09/30/2017 1107    Coagulation Profile: Recent Labs  Lab 10/02/17 1030  INR 1.02   Cardiac Enzymes: No results for input(s): CKTOTAL, CKMB, CKMBINDEX, TROPONINI in the last 168 hours. HbA1C: Hgb A1c MFr Bld  Date/Time Value Ref Range Status  03/27/2017 06:54 AM 5.4 4.8 - 5.6 % Final    Comment:    (NOTE) Pre diabetes:          5.7%-6.4% Diabetes:              >6.4% Glycemic control for   <7.0% adults with diabetes   07/02/2016 07:34 AM 5.4 4.8 - 5.6 % Final    Comment:    (NOTE)         Pre-diabetes: 5.7 - 6.4         Diabetes: >6.4         Glycemic control for adults with diabetes: <7.0    CBG: Recent Labs  Lab 10/05/17 1929 10/05/17 2302 10/06/17 0358 10/06/17 0545 10/06/17 0825  GLUCAP 111* 118* 121* 112* 106*    CC time:  80 mins  Kennieth Rad, AGACNP-BC [facility] Pgr: [phone] or if no answer [phone] 10/06/2017, 9:03 AM

## 2017-10-06 NOTE — Progress Notes (Signed)
9 Days Post-Op      Subjective: Still sedated with trach yesterday.  Weaning down the FiO2.  Drain is serous with old clots.  Ostomy is working well and midline incision looks very good.   Objective: Vital signs in last 24 hours: Temp:  [98.5 F (36.9 C)-102.1 F (38.9 C)] 98.5 F (36.9 C) (09/19 0400) Pulse Rate:  [84-128] 96 (09/19 0700) Resp:  [21-38] 26 (09/19 0700) BP: (85-130)/(50-70) 89/54 (09/19 0700) SpO2:  [93 %-100 %] 100 % (09/19 0737) FiO2 (%):  [30 %-80 %] 40 % (09/19 0737) Weight:  [68.9 kg] 68.9 kg (09/19 0500) Last BM Date: 10/05/17 240 TF 3500 IV Urine 1190 Drain 295 Stool/ileostomy:  950 Spiked another temprature at 1900 last PM 102.1, afebrile remainder of the time SBP mostly int the 90 range VENT is being weaned and FiO2 down to 40% . dexmedetomidine (PRECEDEX) IV infusion 2 mcg/kg/hr (10/06/17 0824)  . magnesium sulfate 1 - 4 g bolus IVPB 2 g (10/06/17 0831)  . midazolam (VERSED) infusion 10 mg/hr (10/06/17 0059)  . piperacillin-tazobactam (ZOSYN)  IV 3.375 g (10/06/17 0205)  . TPN ADULT (ION) 95 mL/hr at 10/05/17 1750   Labs are stable Intake/Output from previous day: 09/18 0701 - 09/19 0700 In: 3939.7 [I.V.:3377.5; NG/GT:240; IV Piggyback:267.1] Out: 2435 [Urine:1190; Drains:295; Stool:950] Intake/Output this shift: No intake/output data recorded.  General appearance: still sedated; no distress Throat: tracheostomy placed yesterday Resp: clear to auscultation bilaterally and anterior GI: BS hypoactive, midline looks fine, ostomy is pink with good output.  Drain is all serous with old clot in the base of the drain.  Lab Results:  Recent Labs    10/05/17 0833 10/06/17 0445  WBC 10.9* 11.2*  HGB 11.5* 10.5*  HCT 36.9* 33.8*  PLT 189 162    BMET Recent Labs    10/05/17 0834 10/06/17 0415  NA 140 138  K 4.6 4.0  CL 107 107  CO2 23 23  GLUCOSE 104* 116*  BUN 16 20  CREATININE 0.57* 0.55*  CALCIUM 8.7* 7.8*   PT/INR No results  for input(s): LABPROT, INR in the last 72 hours.  Recent Labs  Lab 10/03/17 0600 10/06/17 0415  AST 15 21  ALT 19 39  ALKPHOS 75 89  BILITOT 0.3 0.4  PROT 5.0* 5.2*  ALBUMIN 1.9* 1.9*     Lipase     Component Value Date/Time   LIPASE 37 09/21/2017 2237     Medications: . albuterol  2.5 mg Nebulization Q4H  . busPIRone  5 mg Per Tube TID  . chlorhexidine gluconate (MEDLINE KIT)  15 mL Mouth Rinse BID  . Chlorhexidine Gluconate Cloth  6 each Topical Daily  . clonazePAM  2 mg Per Tube BID  . feeding supplement (VITAL AF 1.2 CAL)  1,000 mL Per Tube Q24H  . insulin aspart  0-9 Units Subcutaneous Q6H  . levothyroxine  37.5 mcg Intravenous Daily  . mouth rinse  15 mL Mouth Rinse 10 times per day  . oxyCODONE  5 mg Per Tube Q6H  . phenylephrine  80 mcg Intravenous Once  . QUEtiapine  50 mg Per Tube BID  . sodium chloride flush  10-40 mL Intracatheter Q12H  . sodium chloride flush  3 mL Intravenous Q12H  . THROMBI-PAD  1 each Topical Once  . valproic acid  500 mg Per Tube BID  . vecuronium  10 mg Intravenous Once    Assessment/Plan  History ADD, anxiety, depression, polysubstance abuse- CCM managing Protein-calorie malnutirition -  TNA/trickle Tube feeding being advanced Acute blood loss anemia and anemia of chronic disease- 1,000cc sanguinous drainage in JP on 9/12;transfused pRBC and FFP 9/12; transfused again 9/14 2units PRBC Acute hypoxic respiratory failure, aspiration PNA - per CCM, extubated 9/12- reintubated 09/30/17 - tracheostomy - 10/05/17 AcutebilateralPE - perCCM, holding heparin due to bloody high output JP Emesis and possible aspiration Post op bleeding transfused multiple times, 2 units of PRBC 9/14, last transfusion H/H stable since last transfusion   Colonic inertia Megacolon with rectal fecal impaction POD#9S/P total abdominal colectomy, ileostomy, Dr. Ninfa Linden, 09/27/17 -NG 7,000 cc/24h, continue NG tube to LIWS -starting trickletube  feeds>> up to 20 ml/hr when feeds on - continue JP,295 cc/24h, follow ouput - await return of bowel function   FEN:NPO, NGT, TPN VTE: SCD's,holdingheparin UG:AYGEF 09/10 =>>day 9 - fever 9/16 100.5 and again last PM 9/18 >>102 then afebrile again Foley:placed 9/10 Follow up:Dr. Ninfa Linden  Plan:  I will check on restarting heparin and antibiotics with Dr. Brantley Stage.     LOS: 14 days    Philip Thompson 10/06/2017 [phone]

## 2017-10-06 NOTE — Progress Notes (Signed)
Clinician PA wanted pt up in chair. Since pt had just gone on trach collar tried chair position in bed with HOB greater than 50 degrees BP 87/55 RR 33 - did not drop legs completely - denied any dizziness Versed gtt at 4 at this time and continuing to wean. Sister at bedside comforting pt giving encouragement pt  Placed neck roll behind his head

## 2017-10-07 ENCOUNTER — Inpatient Hospital Stay (HOSPITAL_COMMUNITY): Payer: BLUE CROSS/BLUE SHIELD

## 2017-10-07 LAB — GLUCOSE, CAPILLARY
GLUCOSE-CAPILLARY: 121 mg/dL — AB (ref 70–99)
GLUCOSE-CAPILLARY: 107 mg/dL — AB (ref 70–99)
Glucose-Capillary: 108 mg/dL — ABNORMAL HIGH (ref 70–99)
Glucose-Capillary: 112 mg/dL — ABNORMAL HIGH (ref 70–99)
Glucose-Capillary: 126 mg/dL — ABNORMAL HIGH (ref 70–99)
Glucose-Capillary: 81 mg/dL (ref 70–99)
Glucose-Capillary: 90 mg/dL (ref 70–99)

## 2017-10-07 LAB — HEPARIN LEVEL (UNFRACTIONATED): Heparin Unfractionated: <0.1 IU/mL — ABNORMAL LOW (ref 0.30–0.70)

## 2017-10-07 MED ORDER — TRAZODONE HCL 50 MG PO TABS
100.0000 mg | ORAL_TABLET | Freq: Every day | ORAL | Status: DC
  Administered 2017-10-07 – 2017-10-20 (×12): 100 mg via ORAL
  Filled 2017-10-07 (×13): qty 2

## 2017-10-07 MED ORDER — BUSPIRONE HCL 5 MG PO TABS
10.0000 mg | ORAL_TABLET | Freq: Three times a day (TID) | ORAL | Status: DC
  Administered 2017-10-07 – 2017-10-12 (×17): 10 mg
  Filled 2017-10-07 (×18): qty 2

## 2017-10-07 MED ORDER — PROCHLORPERAZINE EDISYLATE 10 MG/2ML IJ SOLN
10.0000 mg | Freq: Once | INTRAMUSCULAR | Status: AC
  Administered 2017-10-07: 10 mg via INTRAVENOUS
  Filled 2017-10-07: qty 2

## 2017-10-07 MED ORDER — VITAL AF 1.2 CAL PO LIQD: 1000.0000 mL | ORAL | Status: DC

## 2017-10-07 MED ORDER — VITAL AF 1.2 CAL PO LIQD
1000.0000 mL | ORAL | Status: DC
  Administered 2017-10-07: 1000 mL

## 2017-10-07 MED ORDER — CITALOPRAM HYDROBROMIDE 10 MG/5ML PO SOLN
20.0000 mg | Freq: Every day | ORAL | Status: DC
  Administered 2017-10-07 – 2017-10-21 (×14): 20 mg
  Filled 2017-10-07 (×15): qty 10

## 2017-10-07 MED ORDER — ENOXAPARIN SODIUM 80 MG/0.8ML ~~LOC~~ SOLN
70.0000 mg | Freq: Two times a day (BID) | SUBCUTANEOUS | Status: DC
  Administered 2017-10-08 – 2017-10-15 (×15): 70 mg via SUBCUTANEOUS
  Filled 2017-10-07 (×15): qty 0.8

## 2017-10-07 MED ORDER — HEPARIN (PORCINE) IN NACL 100-0.45 UNIT/ML-% IJ SOLN
1100.0000 [IU]/h | INTRAMUSCULAR | Status: DC
  Administered 2017-10-07: 1100 [IU]/h via INTRAVENOUS
  Filled 2017-10-07: qty 250

## 2017-10-07 MED ORDER — ENOXAPARIN SODIUM 80 MG/0.8ML ~~LOC~~ SOLN: 70.0000 mg | Freq: Once | SUBCUTANEOUS | Status: DC

## 2017-10-07 MED ORDER — ACETAMINOPHEN 160 MG/5ML PO SOLN
1000.0000 mg | Freq: Once | ORAL | Status: AC
  Administered 2017-10-07: 1000 mg
  Filled 2017-10-07: qty 40.6

## 2017-10-07 MED ORDER — PRO-STAT SUGAR FREE PO LIQD
30.0000 mL | Freq: Two times a day (BID) | ORAL | Status: DC
  Administered 2017-10-07 – 2017-10-11 (×9): 30 mL via ORAL
  Filled 2017-10-07 (×8): qty 30

## 2017-10-07 MED ORDER — DEXTROSE-NACL 5-0.45 % IV SOLN
INTRAVENOUS | Status: DC
  Administered 2017-10-07 (×2): via INTRAVENOUS
  Administered 2017-10-08: 1000 mL via INTRAVENOUS
  Administered 2017-10-09 – 2017-10-13 (×6): via INTRAVENOUS

## 2017-10-07 MED ORDER — DEXMEDETOMIDINE HCL IN NACL 400 MCG/100ML IV SOLN
0.4000 ug/kg/h | INTRAVENOUS | Status: DC
  Administered 2017-10-07 – 2017-10-08 (×2): 0.4 ug/kg/h via INTRAVENOUS
  Administered 2017-10-08: 1.2 ug/kg/h via INTRAVENOUS
  Administered 2017-10-09 (×2): 0.6 ug/kg/h via INTRAVENOUS
  Filled 2017-10-07 (×2): qty 100
  Filled 2017-10-07: qty 200
  Filled 2017-10-07: qty 100
  Filled 2017-10-07: qty 200

## 2017-10-07 MED ORDER — QUETIAPINE FUMARATE 200 MG PO TABS
200.0000 mg | ORAL_TABLET | Freq: Two times a day (BID) | ORAL | Status: DC
  Administered 2017-10-07 – 2017-10-11 (×9): 200 mg
  Filled 2017-10-07 (×9): qty 1

## 2017-10-07 MED ORDER — HEPARIN (PORCINE) IN NACL 100-0.45 UNIT/ML-% IJ SOLN
1350.0000 [IU]/h | INTRAMUSCULAR | Status: AC
  Administered 2017-10-07: 1350 [IU]/h via INTRAVENOUS

## 2017-10-07 NOTE — Progress Notes (Signed)
Pt off the vent on 60% ATC and tolerating well. RT to cont to monitor and Vent at bedside.

## 2017-10-07 NOTE — Care Management Note (Signed)
Case Management Note  Patient Details  Name: Philip Thompson MRN: [id number] Date of Birth: 09/15/1990  Subjective/Objective:  From home, pta indep, Presents with colonic inertia/megacolon secondary to constipation and bilateral pulmonary emboli and DVT due to pelvic vein obstruction.  He had acute aspiration pneumonia on 9/10 prior to surgery in the setting of nausea/vomiting.  He went to the OR on 9/10 for total colectomy and ileostomy.   put back on vent ,PT rec CIR.                 Action/Plan: NCM will follow for transition of care needs.   Expected Discharge Date:                  Expected Discharge Plan:  IP Rehab Facility  In-House Referral:  Clinical Social Work  Discharge planning Services  CM Consult  Post Acute Care Choice:    Choice offered to:     DME Arranged:    DME Agency:     HH Arranged:    HH Agency:     Status of Service:  In process, will continue to follow  If discussed at Long Length of Stay Meetings, dates discussed:    Additional Comments:  Leone Haven, RN 10/07/2017, 1:21 PM

## 2017-10-07 NOTE — Progress Notes (Signed)
Philip Thompson  XQJ:[id number] DOB: [birth year] DOA: 09/21/2017 PCP: [name], MD    LOS: 15 days   Reason for Consult / Chief Complaint:  Pre op acute hypoxemic respiratory failure   Consulting MD and date:  Philip Thompson  HPI/Summary of hospital stay:  27 y/o male admitted with colonic inertia/megacolon secondary to constipation and bilateral pulmonary emboli and DVT due to pelvic vein obstruction.  He had acute aspiration pneumonia on 9/10 prior to surgery in the setting of nausea/vomiting.  He went to the OR on 9/10 for total colectomy and ileostomy.    9/12 extubated 9/13 re-intubated 9/14 bleeding from JP prior to starting heparin, ct negative; mucus plugging RLL s/p bronch 9/16 continued bloody drainage from JP, mucous plugs requiring hypertonic nebs and bag lavage 9/17 ongoing agitation/ anxiety; posey and leg restraints added as near self extubation 9/18 Trached; cortrak placed 9/19 TC x 5 hr; TF increased to 30; increased PO sedation in attempts to wean IV precedex/ versed  Subjective:  TC x 5 hours yesterday with good secretion clearance TC 1.5 this am prior to getting tachypneic, restless, and desaturating  Last evening had worsening agitation and sedation increased again to precedex 2/ versed 53m/hr despite increased PO sedation/pysch meds yesterday -2.4L/ 24- increasing output from ileostomy   Objective   Blood pressure 99/74, pulse (!) 106, temperature 100 F (37.8 C), temperature source Oral, resp. rate (!) 22, height 5' 7.99" (1.727 m), weight 69.1 kg, SpO2 97 %.    Vent Mode: PRVC FiO2 (%):  [35 %-80 %] 35 % Set Rate:  [22 bmp] 22 bmp Vt Set:  [550 mL] 550 mL PEEP:  [8 cmH20] 8 cmH20 Plateau Pressure:  [14 cmH20-20 cmH20] 20 cmH20   Intake/Output Summary (Last 24 hours) at 10/07/2017 0844 Last data filed at 10/07/2017 0700 Gross per 24 hour  Intake 1775.06 ml  Output 4280 ml  Net -2504.94 ml   Filed Weights   10/05/17 0416 10/06/17 0500 10/07/17  0423  Weight: 71.2 kg 68.9 kg 69.1 kg    Examination:  General:  Chronically ill, thin, appearing male on TC in NAD HEENT: MM pink/moist, pupils 3/reactive, midline trach, site cdi with empiric foam dressing Neuro: Eyes open, f/c, MAE, intermittent mild agitation, ?worse with mom and sister at bedside as they are very attentive CV: s1s2 rrr, no m/r/g PULM: even/mild tachpnea on TC, lungs bilaterally coarse, minimal secretions GKG:YJEH L abd JP with light serosanguionous, midline incision with cdi dressing, R ostomy  Extremities: warm/dry, no edema  Skin: no rashes   Consults: date of consult/date signed off & final recs:  General surgery 9/6 GI 9/6 Critical care 9/10   Procedures: 9/10 - Total colectomy and ileostomy.  Was found to have a massively distended colon from the cecum all the way to distal rectum and pelvic rim.  No evidence of perforation. Oral endotracheal tube 9/10 >> 9/12.  9/13 >  PICC 9/11>>  ETT 9/10 >> 9/12;  9/13 >>9/19 Retrievable IVC filter 9/15 6 shiley trach cuffed 9/18 >>  Significant Diagnostic Tests: 9/5 CT chest, abdomen and pelvis: Bilateral PE w/o RHS, diffuse emphysema, massively distended colon and rectum filled with gas and stool. 9/14 CT abdomen/pelvis> subtotal colectomy and ileostomy, fluid filled dilated small bowel loops, likely post operative, though local enteritis not excluded. JP drain in RLQ; rectal wall thickening improevd, worsening right base airspace disease.  Micro Data: Sputum culture 9/10 >> normal flora MRSA PCR 9/10 >> neg BAL  9/18 >>30,000 colonies staph aureus  >>  Antimicrobials:  Zosyn (possible aspiration) 9/10 > 9/19  Assessment & Plan:   Acute respiratory failure with hypoxemia due to aspiration pneumonia and mucus plugging - CXR 9/19 reviewed- trach/PICC/feeding tube stable; improved aeration right base otherwise stable - completed zosyn x 10 days 9/19 P:  - continue full MV support, PRVC 8cc/kg - Daily TC  trials - continue pulm hygiene - bed PT; metanebs; progress PT/ OOB to chair - continue BD - trend CXR  - VAP bundle - trach care protocol- new trach 9/17 - monitor clinically off abx  Thrombocytopenia - stable P:  - ongoing monitoring  Provoked DVT/PE from bowel obstruction/abdominal compartment syndrome - anticoagulate held d/t JP bled on 9/14  - no evidence of RV strain on CT - S/p retrievable filter 9/15 P:  - ideally would retrieve filter in 6-8 weeks if able to tolerate anticoagulation. - ok to restart heparin per surgery, no bolus, heparin gtt per pharmacy  Hemorrhagic anemia - bleeding from JP drain but CT did not identify a clear cause; complicated by malnourished state, now resolved - Hgb stable P:  - monitor JP output/ for bleeding now that restarting heparin - Transfuse PRBC for Hgb < 7 gm/dL.  Ogilvie's syndrome s/p colectomy and ileostomy. P:  -  wound care per surgery - bowel regimen; follow ileostomy output- increasing output; will need to keep up with I/O's to prevent dehydration - start D5/0.45 NS at 75 ml/hr- monitor UOP - minimize narcotics for sedation  Protein calorie malnutrition - s/p cortrak 9/18 P:  - continue TPN (initiated 9/12), TPN stopping today - Advance TF per dietary recs  Anxiety/depression, hx of substance abuse disorder - very difficult situation to manage ongoing agitation given numerous home pysch meds and prior substance abuse to wean IV and increasing enteral  P:  - continue depakote 533m BID - increase buspar to 143mTID - continue klonopin 69m58mID - increase seroquel from 100m11m200mg88m 800mg 15mome) BID, QTc 0.315 - daily QTc monitoring - continue neurontin, 300mg B73m add celexa 20mg da54m- psych consult when able to fully participate   Need for sedation while on mechanical ventilation - should rapid wean IV sedation now that trached but agitation is ongoing issue P:  - PAD protocol w/ RASS goal 0 - wean  precedex - half versed gtt ceiling and rapid wean today  - restraints as needed for safetly - continue enteral oxycodone, low dose 5mg q 6h28mHypothyroidism  - 9/1 TSH 6.624 , free T4 0.62 P:  Continue synthroid Will need repeat levels in ideally 4-6 weeks (~10/10)  Fever once 9/18 - with mild increase in leukocytosis, likely stress response to trach/ bronch P:  Follow BAL sent 9/18- 30,000 colonies staph aureus, awaiting final report Zosyn completed 9/19 x 10 days Monitor clinically off abx   Disposition / Summary of Today's Plan 10/07/17   Will wean IV sedation and monitor response today Increase enteral meds Stopping TPN today/ increasing TF per dietary recs Monitoring I/O to keep up from increasing ileostomy output Ongoing TC/ SBT trials Restarting heparin gtt, no bolus    Feeding: TPN stopping/ increasing TF Analgesia: precedex, enteral oxy Sedation: RASS goal 0/-1, versed, precedex, seroquel, klonopin, buspar, depakote, Neurontin  Thromboprophylaxis: IVC filtrer/ restarting IV heparin, no bolus HOB >30 degrees Ulcer prophylaxis: famotidine Glucose control: CBG q 4, SSI  Family: Mother and sister updated at bedside 9/20, both are very attentive to  his needs.   Labs   CBC: Recent Labs  Lab 10/01/17 0530  10/02/17 2018 10/03/17 0329 10/05/17 0531 10/05/17 0833 10/06/17 0445  WBC 7.2   < > 7.6 7.6 10.4 10.9* 11.2*  NEUTROABS 4.6  --   --  5.4  --  8.0*  --   HGB 8.8*   < > 10.4* 10.4* 10.7* 11.5* 10.5*  HCT 26.4*   < > 32.4* 32.0* 34.0* 36.9* 33.8*  MCV 86.0   < > 92.8 91.4 94.2 91.6 90.6  PLT 115*   < > 111* 126* 156 189 162   < > = values in this interval not displayed.   Basic Metabolic Panel: Recent Labs  Lab 10/02/17 0629 10/03/17 0600 10/04/17 0638 10/05/17 0531 10/05/17 0834 10/06/17 0415  NA 142 139 143 131* 140 138  K 3.8 3.8 4.2 >7.5* 4.6 4.0  CL 110 108 114* 103 107 107  CO2 _0 20* 23 23  GLUCOSE 121* 118* 164* 754* 104* 116*    BUN 5* _1 CREATININE 0.45* 0.42* 0.54* 0.61 0.57* 0.55*  CALCIUM 8.3* 8.2* 8.1* 8.2* 8.7* 7.8*  MG 1.9 1.9  --  2.5* 2.0 1.8  PHOS 3.0 3.8  --  8.6* 4.8* 4.3   GFR: Estimated Creatinine Clearance: 134.2 mL/min (A) (by C-G formula based on SCr of 0.55 mg/dL (L)). Recent Labs  Lab 10/03/17 0329 10/05/17 0531 10/05/17 0833 10/06/17 0445  WBC 7.6 10.4 10.9* 11.2*   Liver Function Tests: Recent Labs  Lab 10/03/17 0600 10/06/17 0415  AST 15 21  ALT 19 39  ALKPHOS 75 89  BILITOT 0.3 0.4  PROT 5.0* 5.2*  ALBUMIN 1.9* 1.9*   No results for input(s): LIPASE, AMYLASE in the last 168 hours. No results for input(s): AMMONIA in the last 168 hours. ABG    Component Value Date/Time   PHART 7.464 (H) 09/30/2017 1107   PCO2ART 36.8 09/30/2017 1107   PO2ART 60.0 (L) 09/30/2017 1107   HCO3 26.3 09/30/2017 1107   TCO2 27 09/30/2017 1107   ACIDBASEDEF 1.0 09/27/2017 1551   O2SAT 92.0 09/30/2017 1107    Coagulation Profile: Recent Labs  Lab 10/02/17 1030  INR 1.02   Cardiac Enzymes: No results for input(s): CKTOTAL, CKMB, CKMBINDEX, TROPONINI in the last 168 hours. HbA1C: Hgb A1c MFr Bld  Date/Time Value Ref Range Status  03/27/2017 06:54 AM 5.4 4.8 - 5.6 % Final    Comment:    (NOTE) Pre diabetes:          5.7%-6.4% Diabetes:              >6.4% Glycemic control for   <7.0% adults with diabetes   07/02/2016 07:34 AM 5.4 4.8 - 5.6 % Final    Comment:    (NOTE)         Pre-diabetes: 5.7 - 6.4         Diabetes: >6.4         Glycemic control for adults with diabetes: <7.0    CBG: Recent Labs  Lab 10/06/17 1231 10/06/17 1648 10/06/17 2003 10/07/17 0018 10/07/17 0418  GLUCAP 104* 100* 88 90 81     Kennieth Rad, AGACNP-BC [facility] Pgr: [phone] or if no answer [phone] 10/07/2017, 8:44 AM

## 2017-10-07 NOTE — Progress Notes (Addendum)
ANTICOAGULATION CONSULT NOTE   Pharmacy Consult for Heparin>> lovenox Indication: pulmonary embolus  No Known Allergies  Patient Measurements: Height: 5' 7.99" (172.7 cm) Weight: 152 lb 5.4 oz (69.1 kg) IBW/kg (Calculated) : 68.38  Heparin dosing weight: 69 kg  Vital Signs: Temp: 99.9 F (37.7 C) (09/20 1145) Temp Source: Oral (09/20 1145) BP: 100/53 (09/20 1300) Pulse Rate: 121 (09/20 1300)  Labs: Recent Labs    10/05/17 0531 10/05/17 0833 10/05/17 0834 10/06/17 0415 10/06/17 0445  HGB 10.7* 11.5*  --   --  10.5*  HCT 34.0* 36.9*  --   --  33.8*  PLT 156 189  --   --  162  CREATININE 0.61  --  0.57* 0.55*  --     Estimated Creatinine Clearance: 134.2 mL/min (A) (by C-G formula based on SCr of 0.55 mg/dL (L)).   Assessment: [name] admitted 9/4 for megacolon and severe obstructive constipation with severely dilated colon and colitis s/p colectomy 9/10 and found to have bilateral pulmonary emboli and DVT (presumed to be related to GI issue impeding venous return). Initially treated with heparin infusion but stopped d/t continued bleeding from JP drain requiring PRBC and FFP 9/12 and another 2 units PRBC 9/14. Subsequently, IVC Filter placed 9/15. He is on heparin and pharmacy consulted to change to lovenox (heparin has been discontinued) -Hg= 10.5, PLTC= 162 -SCr= 0.55 and CrCl > 100  Goal of Therapy:  Heparin level 0.3-0.7 units/ml Monitor platelets by anticoagulation protocol: Yes   Plan:  Lovenox 70 mg [place] q12h CBC daily for now  Harland German, PharmD Clinical Pharmacist Please check Amion for pharmacy contact number  Addendum: Received orders from CCM not to initiate Lovenox until tomorrow AM.  Planning IV heparin overnight to ensure no bleeding from JP drain.  Continue IV heparin at 1100 units/hr.  Check heparin level at 8 PM.  Reece Leader, [place], [facility] Clinical Pharmacist Phone [phone]  10/07/2017 4:30 PM

## 2017-10-07 NOTE — Progress Notes (Signed)
Took patient off of vent and placed on 80% ATC, per Dr's order.

## 2017-10-07 NOTE — Evaluation (Addendum)
Physical Therapy Evaluation Patient Details Name: Philip Thompson MRN: [id number] DOB: Sep 16, 1990 Today's Date: 10/07/2017   History of Present Illness  27 y/o male admitted with colonic inertia/megacolon secondary to constipation and bilateral pulmonary emboli and DVT due to pelvic vein obstruction.  He had acute aspiration pneumonia on 9/10 prior to surgery in the setting of nausea/vomiting.  He went to the OR on 9/10 for total colectomy and ileostomy.  Extubated 9/12, Reintubated 9/13. 9/18 Trached and Cortrak placed PMH Anxiety/depression, hx of substance abuse disorder  Clinical Impression  [name] sister provided information of prior function and home setup as pt unable to respond to questions at this time. 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 Pt currently limited in mobility by orthostatic hypotension, decreased cognition and deconditioning. Pt able to move all extremities but requires increased time and multimodal cuing to achieve. Pt requires maxAx2 for rolling L and R. Given pt's prior level of function PT recommends CIR level rehab at d/c. PT will continue to follow acutely and progress mobility as medically appropriate.     Follow Up Recommendations CIR    Equipment Recommendations  Other (comment)(TBD at next venue)    Recommendations for Other Services Rehab consult;OT consult     Precautions / Restrictions Precautions Precautions: Fall Restrictions Weight Bearing Restrictions: No      Mobility  Bed Mobility Overal bed mobility: Needs Assistance Bed Mobility: Rolling Rolling: Max assist;+2 for physical assistance         General bed mobility comments: unable to attempt EoB due to drop in BP with elevation of HoB              Pertinent Vitals/Pain Pain Assessment: Faces Faces Pain Scale: Hurts even more Pain Location: generalized with movement Pain Descriptors / Indicators:  Grimacing;Guarding Pain Intervention(s): Limited activity within patient's tolerance;Monitored during session;Repositioned    Home Living Family/patient expects to be discharged to:: Private residence Living Arrangements: Parent Available Help at Discharge: Family;Available PRN/intermittently Type of Home: House Home Access: Stairs to enter Entrance Stairs-Rails: None Entrance Stairs-Number of Steps: 2 Home Layout: One level Home Equipment: None Additional Comments: home living and prior function collected from sister    Prior Function Level of Independence: Independent         Comments: community ambulator, builds Materials engineer        Extremity/Trunk Assessment   Upper Extremity Assessment Upper Extremity Assessment: Difficult to assess due to impaired cognition(PROM WFL, strength grossly assessed at 4/5)    Lower Extremity Assessment Lower Extremity Assessment: Difficult to assess due to impaired cognition(PROM WFL, strength grossly assessed at 4/5)    Cervical / Trunk Assessment Cervical / Trunk Assessment: Other exceptions(Left lateral lean in bed unable to self correct)  Communication   Communication: Tracheostomy;Other (comment)(sedation)  Cognition Arousal/Alertness: Lethargic;Suspect due to medications Behavior During Therapy: Restless;Impulsive Overall Cognitive Status: Impaired/Different from baseline Area of Impairment: Attention;Following commands;Safety/judgement;Awareness;Problem solving                   Current Attention Level: Focused   Following Commands: Follows one step commands with increased time Safety/Judgement: Decreased awareness of deficits Awareness: Emergent Problem Solving: Slow processing;Decreased initiation;Difficulty sequencing;Requires verbal cues;Requires tactile cues General Comments: increased time and multimodal cuing for movement      General Comments General comments (skin integrity, edema, etc.): Pt being  weaned off vent during evaluation, FiO2 35%O2,8 L/min O2 with HoB 30  degrees SaO2 96%O2, HR 104 bpm, RR 22, BP 112/59, with elevation of bed to 45 degrees BP 91/54, SaO2 91%O2, HR 109bpm, RR 31, with elevation to 55 degrees BP 89/56, SaO2 88%O2, RR 35, HR 112 bpm, returned to 45 degrees, SaO2 90%O2, HR 110 bpm, BP 90/54, RR 30       Assessment/Plan    PT Assessment Patient needs continued PT services  PT Problem List Decreased strength;Decreased activity tolerance;Decreased mobility;Decreased cognition;Decreased safety awareness;Cardiopulmonary status limiting activity;Pain       PT Treatment Interventions DME instruction;Gait training;Functional mobility training;Therapeutic activities;Therapeutic exercise;Balance training;Cognitive remediation;Patient/family education    PT Goals (Current goals can be found in the Care Plan section)  Acute Rehab PT Goals Patient Stated Goal: none stated PT Goal Formulation: With family Time For Goal Achievement: 10/21/17 Potential to Achieve Goals: Good    Frequency Min 3X/week    AM-PAC PT "6 Clicks" Daily Activity  Outcome Measure Difficulty turning over in bed (including adjusting bedclothes, sheets and blankets)?: Unable Difficulty moving from lying on back to sitting on the side of the bed? : Unable Difficulty sitting down on and standing up from a chair with arms (e.g., wheelchair, bedside commode, etc,.)?: Unable Help needed moving to and from a bed to chair (including a wheelchair)?: Total Help needed walking in hospital room?: Total Help needed climbing 3-5 steps with a railing? : Total 6 Click Score: 6    End of Session Equipment Utilized During Treatment: Oxygen Activity Tolerance: Treatment limited secondary to medical complications (Comment) Patient left: in bed;with call bell/phone within reach;with nursing/sitter in room;with restraints reapplied Nurse Communication: Mobility status PT Visit Diagnosis: Unsteadiness on feet  (R26.81);Other abnormalities of gait and mobility (R26.89);Muscle weakness (generalized) (M62.81);Difficulty in walking, not elsewhere classified (R26.2);Pain Pain - part of body: (abdomen)    Time: 1191-4782 PT Time Calculation (min) (ACUTE ONLY): 29 min   Charges:   PT Evaluation $PT Eval High Complexity: 1 High PT Treatments $Therapeutic Activity: 8-22 mins        Daulton Harbaugh B. Beverely Risen PT, DPT Acute Rehabilitation Services Pager [phone] Office [phone]   [name] 10/07/2017, 10:34 AM

## 2017-10-07 NOTE — Progress Notes (Signed)
Nutrition Follow-up  DOCUMENTATION CODES:   Severe malnutrition in context of chronic illness  INTERVENTION:   Tube Feeding:  Vital AF 1.2 @ 60 ml/hr Pro-Stat 30 mL BID Provides 138 g of protein, 1928 kcals, 1166 mL of free water  Consider adding free water flushes via Cortrak and discontinuing D5 infusion  NUTRITION DIAGNOSIS:   Severe Malnutrition related to chronic illness(cardiomyopathy, substance abuse, chronic constipation/obstipation with megacolon) as evidenced by severe fat depletion, severe muscle depletion.  Being addressed via nutrition support  GOAL:   Patient will meet greater than or equal to 90% of their needs  MONITOR:   TF tolerance, I & O's, Labs, Skin, Vent status, Diet advancement  REASON FOR ASSESSMENT:   Ventilator    ASSESSMENT:   27 yo male admitted 9/4 for megacolon and severe obstructive constipation with severely dilated colon and colitis. Noted pt admitted to [facility] at the end of August with constipation x 3 weeks with megacolon with medical therapy attempts, discharged on 9/2. Pt also with chest pain with bilateral PE on admissionPMH includes polysubstance abuse, drug-induced cardiomyopathy, anxiety, chronic obstipation/constipation  Vital AF 1.2 @ 50 ml/hr via Cortrak;  TPN stopped by MD yesterday  Patient remains on vent support via trach, TCT as able MV: 9.6 L/min Temp (24hrs), Avg:100 F (37.8 C), Min:99.5 F (37.5 C), Max:100.4 F (38 C)  Ileosotomy with 1875 mL in 24 hours, JP drain  180 mL; UOP 2.4 L  Weight down to 69 kg. Nutrition needs re-estimate  Per MD note, D5-1/2 NS started at 75 today to prevent dehydration as ileostomy output increasing. Recommend considering adding free water flushes and discontinuing D5-1/2NS infusion    Labs: reviewed; CBGs 88-108 Meds: D5-1/2 NS at 75 ml/hr (306 kcals)   Diet Order:   Diet Order            Diet NPO time specified  Diet effective midnight              EDUCATION  NEEDS:   Not appropriate for education at this time  Skin:  Skin Assessment: Skin Integrity Issues: Skin Integrity Issues:: Incisions Incisions: abdomen  Last BM:  stool via ileostomy   Height:   Ht Readings from Last 1 Encounters:  09/27/17 5' 7.99" (1.727 m)    Weight:   Wt Readings from Last 1 Encounters:  10/07/17 69.1 kg    Ideal Body Weight:  70 kg  BMI:  Body mass index is 23.17 kg/m.  Estimated Nutritional Needs:   Kcal:  2006 kcals   Protein:  138-152 g  Fluid:  >/= 2 L   Romelle Starcher MS, RD, LDN, CNSC [phone] Pager  [phone] Weekend/On-Call Pager

## 2017-10-07 NOTE — Progress Notes (Signed)
Pt agitated and spitting. HR elevated into the 150s. NP notified.

## 2017-10-07 NOTE — Progress Notes (Signed)
10 Days Post-Op    CC:  Subjective: His mom says he had a bad night, while trying to wean drips he had to be sedated again.  Ostomy looks fine and is working well.   Midline incision looks fine, we can get the staples out next week.    Objective: Vital signs in last 24 hours: Temp:  [99.5 F (37.5 C)-100.3 F (37.9 C)] 100 F (37.8 C) (09/19 2006) Pulse Rate:  [97-151] 106 (09/20 0700) Resp:  [20-46] 22 (09/20 0700) BP: (88-138)/(52-88) 99/74 (09/20 0700) SpO2:  [81 %-100 %] 96 % (09/20 0700) FiO2 (%):  [40 %-80 %] 60 % (09/20 0446) Weight:  [69.1 kg] 69.1 kg (09/20 0423) Last BM Date: 10/05/17 NPO 445 TF 1500 IV 2410 urine Drain 180 Stool/ileostomy - 1875 Reported:  negative 2.4 liters . dexmedetomidine (PRECEDEX) IV infusion 2 mcg/kg/hr (10/07/17 0700)  . midazolam (VERSED) infusion 10 mg/hr (10/07/17 0700)  low grade time at 1500 hrs & 1900 hrs :  100.3/100 BP stable still low grade tachycardia No labs this AM CXR 9/20:Tracheostomy tube and feeding catheter are again noted and stable. Right-sided PICC line is noted at the cavoatrial junction. Cardiac shadow is within normal limits. The lungs are well aerated bilaterally. Improved aeration in the right base is noted although mild residual atelectasis remains.  Intake/Output from previous day: 09/19 0701 - 09/20 0700 In: 2038.1 [I.V.:1493.1; NG/GT:445; IV Piggyback:100] Out: 6659 [Urine:2410; Drains:180; DJTTS:1779] Intake/Output this shift: No intake/output data recorded.  General appearance: still sedated not looking around much this AM.   Resp: clear to auscultation bilaterally and anterior exam GI: soft, BS hypoactive, but good output from the ileostomy.  Midline incision looks fine  Lab Results:  Recent Labs    10/05/17 0833 10/06/17 0445  WBC 10.9* 11.2*  HGB 11.5* 10.5*  HCT 36.9* 33.8*  PLT 189 162    BMET Recent Labs    10/05/17 0834 10/06/17 0415  NA 140 138  K 4.6 4.0  CL 107 107  CO2  23 23  GLUCOSE 104* 116*  BUN 16 20  CREATININE 0.57* 0.55*  CALCIUM 8.7* 7.8*   PT/INR No results for input(s): LABPROT, INR in the last 72 hours.  Recent Labs  Lab 10/03/17 0600 10/06/17 0415  AST 15 21  ALT 19 39  ALKPHOS 75 89  BILITOT 0.3 0.4  PROT 5.0* 5.2*  ALBUMIN 1.9* 1.9*     Lipase     Component Value Date/Time   LIPASE 37 09/21/2017 2237     Medications: . albuterol  2.5 mg Nebulization Q4H  . busPIRone  5 mg Per Tube TID  . chlorhexidine gluconate (MEDLINE KIT)  15 mL Mouth Rinse BID  . Chlorhexidine Gluconate Cloth  6 each Topical Daily  . clonazePAM  2 mg Per Tube BID  . feeding supplement (VITAL AF 1.2 CAL)  1,000 mL Per Tube Q24H  . gabapentin  300 mg Per Tube Q12H  . insulin aspart  0-9 Units Subcutaneous Q6H  . levothyroxine  75 mcg Per Tube QAC breakfast  . mouth rinse  15 mL Mouth Rinse 10 times per day  . oxyCODONE  5 mg Per Tube Q6H  . phenylephrine  80 mcg Intravenous Once  . QUEtiapine  100 mg Per Tube BID  . sodium chloride flush  10-40 mL Intracatheter Q12H  . sodium chloride flush  3 mL Intravenous Q12H  . traZODone  50 mg Oral QHS  . valproic acid  500 mg Per  Tube BID    Assessment/Plan  History ADD, anxiety, depression, polysubstance abuse- CCM managing Protein-calorie malnutirition - TNA/trickle Tube feeding being advanced Acute blood loss anemia and anemia of chronic disease- 1,000cc sanguinous drainage in JP on 9/12;transfused pRBC and FFP 9/12; transfused again 9/14 2units PRBC Acute hypoxic respiratory failure, aspiration PNA - per CCM, extubated 9/12- reintubated 09/30/17 - tracheostomy - 10/05/17 AcutebilateralPE - perCCM, holding heparin due to bloody high output JP Emesis and possible aspiration Post op bleeding transfused multiple times, 2 units of PRBC 9/14, last transfusion H/H stable since last transfusion   Colonic inertia Megacolon with rectal fecal impaction POD#10S/P total abdominal colectomy,  ileostomy, Dr. Ninfa Linden, 09/27/17 -NG 7,000 cc/24h, continue NG tube to LIWS -starting trickletube feeds>> up to 20 ml/hr when feeds on - continue JP,295cc/24h, follow ouput - await return of bowel function   FEN:NPO, NGT, TPN being weaned VTE: SCD's,holdingheparin- from our standpoint you can restart heparin, no bolus EM:LJQGB 09/10 =>>day 10-  ongoing low grade fevers.  From our standpoint this can be stopped and will defer to CCM Foley:placed 9/10 Follow up:Dr. Ninfa Linden  Plan:  CCM had him on a Trach collar yesterday for 5 hrs, but still needed sedation later in the evening. From our standpoint you can restart the heparin without a bolus, and stop the antibiotics. We will get the staples out next week.    LOS: 15 days    [place] 10/07/2017 [phone]

## 2017-10-07 NOTE — Progress Notes (Signed)
Placed patient back on the vent due to desaturation to 81%.

## 2017-10-07 NOTE — Progress Notes (Signed)
Placed patient on 35% ATC, and vent on standby.

## 2017-10-07 NOTE — Progress Notes (Signed)
SLP Cancellation Note  Patient Details Name: MELBERN CEFALO MRN: [id number] DOB: 1990-08-26   Cancelled treatment:   Attempted to see pt for PMV evaluation.  Pt tolerated TCT yesterday and had been off vent for close to 3 hours at time of SLP arrival.  O2 sats in high 80s, low 90s per RN.  On SLP arrival, pt was desating to mid 50s and expressed he was having difficulty breathing.  RT was called and pt placed back on vent.  SLP will continue to follow for PMV trials as appropriate.  Tammra Pressman E Jontae Adebayo 10/07/2017, 11:09 AM

## 2017-10-07 NOTE — Progress Notes (Signed)
ANTICOAGULATION CONSULT NOTE   Pharmacy Consult for Heparin Indication: pulmonary embolus  No Known Allergies  Patient Measurements: Height: 5' 7.99" (172.7 cm) Weight: 152 lb 5.4 oz (69.1 kg) IBW/kg (Calculated) : 68.38  Heparin dosing weight: 69 kg  Vital Signs: Temp: 100.7 F (38.2 C) (09/20 1951) Temp Source: Oral (09/20 1951) BP: 126/77 (09/20 2000) Pulse Rate: 115 (09/20 2100)  Labs: Recent Labs    10/05/17 0531 10/05/17 6945 10/05/17 0834 10/06/17 0415 10/06/17 0445 10/07/17 2000  HGB 10.7* 11.5*  --   --  10.5*  --   HCT 34.0* 36.9*  --   --  33.8*  --   PLT 156 189  --   --  162  --   HEPARINUNFRC  --   --   --   --   --  <0.10*  CREATININE 0.61  --  0.57* 0.55*  --   --     Estimated Creatinine Clearance: 134.2 mL/min (A) (by C-G formula based on SCr of 0.55 mg/dL (L)).   Assessment: [name] admitted 9/4 for megacolon and severe obstructive constipation with severely dilated colon and colitis s/p colectomy 9/10 and found to have bilateral pulmonary emboli and DVT (presumed to be related to GI issue impeding venous return). Initially treated with heparin infusion but stopped d/t continued bleeding from JP drain requiring PRBC and FFP 9/12 and another 2 units PRBC 9/14. Subsequently, IVC Filter placed 9/15. He is on heparin and pharmacy consulted to change to lovenox (heparin has been discontinued) -Hg= 10.5, PLTC= 162 -SCr= 0.55 and CrCl > 100  Goal of Therapy:  Heparin level 0.3-0.7 units/ml Monitor platelets by anticoagulation protocol: Yes   Plan:  Lovenox 70 mg [place] q12h CBC daily for now  Harland German, PharmD Clinical Pharmacist Please check Amion for pharmacy contact number  Addendum: Received orders from CCM not to initiate Lovenox until tomorrow AM.  Planning IV heparin overnight to ensure no bleeding from JP drain.  Continue IV heparin at 1100 units/hr.  Check heparin level at 8 PM.  --  8 PM heparin level is undetectable on 1100 units/hr  of heparin.  Previously this admission required 2050 units/hr of heparin to reach goal level.  Dosing more conservatively now given previous bleeding from JP drain.  Increase IV heparin to 1350 units/hr.  Check heparin level in 6 hrs.  Heparin off at 0600 AM for initiation of lovenox.  Jenetta Downer, [facility] Clinical Pharmacist Phone [phone]  10/07/2017 9:25 PM

## 2017-10-07 NOTE — Progress Notes (Signed)
ANTICOAGULATION CONSULT NOTE - Initial Consult  Pharmacy Consult for Heparin Indication: pulmonary embolus  No Known Allergies  Patient Measurements: Height: 5' 7.99" (172.7 cm) Weight: 152 lb 5.4 oz (69.1 kg) IBW/kg (Calculated) : 68.38  Heparin dosing weight: 69 kg  Vital Signs: Temp: 100.4 F (38 C) (09/20 0850) Temp Source: Oral (09/20 0850) BP: 97/55 (09/20 0900) Pulse Rate: 111 (09/20 0900)  Labs: Recent Labs    10/05/17 0531 10/05/17 0833 10/05/17 0834 10/06/17 0415 10/06/17 0445  HGB 10.7* 11.5*  --   --  10.5*  HCT 34.0* 36.9*  --   --  33.8*  PLT 156 189  --   --  162  CREATININE 0.61  --  0.57* 0.55*  --     Estimated Creatinine Clearance: 134.2 mL/min (A) (by C-G formula based on SCr of 0.55 mg/dL (L)).   Assessment: [name] admitted 9/4 for megacolon and severe obstructive constipation with severely dilated colon and colitis s/p colectomy 9/10 and found to have bilateral pulmonary emboli and DVT (presumed to be related to GI issue impeding venous return). Initially treated with heparin infusion but stopped d/t continued bleeding from JP drain requiring PRBC and FFP 9/12 and another 2 units PRBC 9/14. Subsequently, IVC Filter placed 9/15. Pharmacy has been consulted to resume heparin infusion.   Last CBC low but stable with Hgb 10.5 and platelets 162. No documented bleeding. Renal function at baseline with SCr 0.55. Per surgery, will restart heparin infusion with no bolus.  Goal of Therapy:  Heparin level 0.3-0.7 units/ml Monitor platelets by anticoagulation protocol: Yes   Plan:  Start heparin infusion at 1100 units/hr (no bolus) HL in 6 hours Check daily heparin level along with CBC while on heparin Continue to monitor for bleeding.      Harlow Mares, PharmD PGY1 Pharmacy Resident Phone [phone]  10/07/2017   9:35 AM

## 2017-10-08 ENCOUNTER — Inpatient Hospital Stay (HOSPITAL_COMMUNITY): Payer: BLUE CROSS/BLUE SHIELD

## 2017-10-08 DIAGNOSIS — L8990 Pressure ulcer of unspecified site, unspecified stage: Secondary | ICD-10-CM

## 2017-10-08 LAB — RENAL FUNCTION PANEL
ANION GAP: 10 (ref 5–15)
Albumin: 2.3 g/dL — ABNORMAL LOW (ref 3.5–5.0)
BUN: 11 mg/dL (ref 6–20)
CO2: 28 mmol/L (ref 22–32)
Calcium: 8.9 mg/dL (ref 8.9–10.3)
Chloride: 97 mmol/L — ABNORMAL LOW (ref 98–111)
Creatinine, Ser: 0.48 mg/dL — ABNORMAL LOW (ref 0.61–1.24)
GFR calc Af Amer: >60 mL/min (ref >60)
GFR calc non Af Amer: >60 mL/min (ref >60)
GLUCOSE: 117 mg/dL — AB (ref 70–99)
PHOSPHORUS: 3.7 mg/dL (ref 2.5–4.6)
POTASSIUM: 4 mmol/L (ref 3.5–5.1)
Sodium: 135 mmol/L (ref 135–145)

## 2017-10-08 LAB — CBC
HCT: 36.2 % — ABNORMAL LOW (ref 39.0–52.0)
HEMOGLOBIN: 11.4 g/dL — AB (ref 13.0–17.0)
MCH: 28.2 pg (ref 26.0–34.0)
MCHC: 31.5 g/dL (ref 30.0–36.0)
MCV: 89.6 fL (ref 78.0–100.0)
Platelets: 217 10*3/uL (ref 150–400)
RBC: 4.04 MIL/uL — AB (ref 4.22–5.81)
RDW: 14.6 % (ref 11.5–15.5)
WBC: 14.2 10*3/uL — ABNORMAL HIGH (ref 4.0–10.5)

## 2017-10-08 LAB — GLUCOSE, CAPILLARY
GLUCOSE-CAPILLARY: 125 mg/dL — AB (ref 70–99)
GLUCOSE-CAPILLARY: 126 mg/dL — AB (ref 70–99)
GLUCOSE-CAPILLARY: 129 mg/dL — AB (ref 70–99)
GLUCOSE-CAPILLARY: 135 mg/dL — AB (ref 70–99)
GLUCOSE-CAPILLARY: 153 mg/dL — AB (ref 70–99)
Glucose-Capillary: 108 mg/dL — ABNORMAL HIGH (ref 70–99)

## 2017-10-08 LAB — HEPARIN LEVEL (UNFRACTIONATED): Heparin Unfractionated: <0.1 IU/mL — ABNORMAL LOW (ref 0.30–0.70)

## 2017-10-08 LAB — MAGNESIUM: Magnesium: 2 mg/dL (ref 1.7–2.4)

## 2017-10-08 MED ORDER — ERYTHROMYCIN ETHYLSUCCINATE 200 MG/5ML PO SUSR
200.0000 mg | Freq: Four times a day (QID) | ORAL | Status: DC
  Administered 2017-10-08 – 2017-10-09 (×6): 200 mg
  Filled 2017-10-08 (×8): qty 5

## 2017-10-08 MED ORDER — SODIUM CHLORIDE 0.9 % IV BOLUS
1000.0000 mL | Freq: Once | INTRAVENOUS | Status: AC
  Administered 2017-10-08: 1000 mL via INTRAVENOUS

## 2017-10-08 MED ORDER — ACETAMINOPHEN 160 MG/5ML PO SOLN
650.0000 mg | Freq: Four times a day (QID) | ORAL | Status: DC | PRN
  Administered 2017-10-08 – 2017-10-15 (×9): 650 mg via ORAL
  Filled 2017-10-08 (×8): qty 20.3

## 2017-10-08 MED ORDER — SODIUM CHLORIDE 0.9 % IV BOLUS
500.0000 mL | Freq: Once | INTRAVENOUS | Status: AC
  Administered 2017-10-08: 500 mL via INTRAVENOUS

## 2017-10-08 MED ORDER — TRAVASOL 10 % IV SOLN
INTRAVENOUS | Status: AC
  Administered 2017-10-08: 17:00:00 via INTRAVENOUS
  Filled 2017-10-08: qty 1413.6

## 2017-10-08 NOTE — Progress Notes (Signed)
Patient on 40% ATC, tolerating well, vent on standby, will continue to monitor patient.

## 2017-10-08 NOTE — Progress Notes (Signed)
Progress Note: General Surgery Service   Assessment/Plan: Principal Problem:   Ogilvie's syndrome Active Problems:   MDD (major depressive disorder), recurrent severe, without psychosis ([place])   Leg edema   Fecal impaction (HCC)   Acute respiratory failure with hypoxia (HCC)   Acute pulmonary embolism without acute cor pulmonale (HCC)   Hypothyroidism   Colon distention   Preoperative cardiovascular examination   Protein-calorie malnutrition, severe   Acute respiratory failure with hypoxemia (HCC)   Pressure injury of skin  s/p Procedure(s): SUBTOTAL COLECTOMY AND ILEOSTOMY 09/27/2017 NG placed overnight due to gastric motility issue. He continues to have high output ileostomy -agree with trying to get stomach to function to optimize enteric nutrition early -remove blake drain today -plan for staple removal monday    LOS: 16 days  Chief Complaint/Subjective: Vomited yesterday, NG placed with 1 L out overnight  Objective: Vital signs in last 24 hours: Temp:  [98.9 F (37.2 C)-100.7 F (38.2 C)] 99.1 F (37.3 C) (09/21 0400) Pulse Rate:  [26-147] 110 (09/21 0811) Resp:  [18-38] 24 (09/21 0811) BP: (83-135)/(50-111) 135/76 (09/21 0800) SpO2:  [91 %-100 %] 92 % (09/21 0811) FiO2 (%):  [40 %-80 %] 40 % (09/21 0811) Weight:  [69.2 kg] 69.2 kg (09/21 0224) Last BM Date: 10/08/17  Intake/Output from previous day: 09/20 0701 - 09/21 0700 In: 1988.2 [I.V.:1031.2; NG/GT:957] Out: 5231 [Urine:1750; Emesis/NG output:800; Drains:131; Stool:2550] Intake/Output this shift: No intake/output data recorded.   Abd: soft, ND, ostomy with liquid in bag, mucosa pink, blake with serosang drainage  Neuro: moves extremities, communicates vocally though soft spoken  Lab Results: CBC  Recent Labs    10/06/17 0445 10/08/17 0435  WBC 11.2* 14.2*  HGB 10.5* 11.4*  HCT 33.8* 36.2*  PLT 162 217   BMET Recent Labs    10/06/17 0415 10/08/17 0435  NA 138 135  K 4.0 4.0  CL 107  97*  CO2 23 28  GLUCOSE 116* 117*  BUN 20 11  CREATININE 0.55* 0.48*  CALCIUM 7.8* 8.9   PT/INR No results for input(s): LABPROT, INR in the last 72 hours. ABG No results for input(s): PHART, HCO3 in the last 72 hours.  Invalid input(s): PCO2, PO2  Studies/Results:  Anti-infectives: Anti-infectives (From admission, onward)   Start     Dose/Rate Route Frequency Ordered Stop   10/08/17 0815  erythromycin ethylsuccinate (EES) 200 MG/5ML suspension 200 mg     200 mg Per Tube Every 6 hours 10/08/17 0807 10/13/17 0559   09/27/17 1000  piperacillin-tazobactam (ZOSYN) IVPB 3.375 g  Status:  Discontinued     3.375 g 12.5 mL/hr over 240 Minutes Intravenous Every 8 hours 09/27/17 0845 10/06/17 1504      Medications: Scheduled Meds: . albuterol  2.5 mg Nebulization Q4H  . busPIRone  10 mg Per Tube TID  . chlorhexidine gluconate (MEDLINE KIT)  15 mL Mouth Rinse BID  . Chlorhexidine Gluconate Cloth  6 each Topical Daily  . citalopram  20 mg Per Tube Daily  . clonazePAM  2 mg Per Tube BID  . enoxaparin (LOVENOX) injection  70 mg Subcutaneous Q12H  . erythromycin ethylsuccinate  200 mg Per Tube Q6H  . feeding supplement (PRO-STAT SUGAR FREE 64)  30 mL Oral BID  . gabapentin  300 mg Per Tube Q12H  . insulin aspart  0-9 Units Subcutaneous Q6H  . levothyroxine  75 mcg Per Tube QAC breakfast  . mouth rinse  15 mL Mouth Rinse 10 times per day  .  oxyCODONE  5 mg Per Tube Q6H  . QUEtiapine  200 mg Per Tube BID  . sodium chloride flush  10-40 mL Intracatheter Q12H  . sodium chloride flush  3 mL Intravenous Q12H  . traZODone  100 mg Oral QHS  . valproic acid  500 mg Per Tube BID   Continuous Infusions: . dexmedetomidine (PRECEDEX) IV infusion 1.2 mcg/kg/hr (10/08/17 0235)  . dextrose 5 % and 0.45% NaCl 75 mL/hr at 10/07/17 2123  . feeding supplement (VITAL AF 1.2 CAL) Stopped (10/07/17 2200)   PRN Meds:.[DISCONTINUED] ondansetron **OR** ondansetron (ZOFRAN) IV, sodium chloride  flush  Mickeal Skinner, MD Pg# [phone] [facility]

## 2017-10-08 NOTE — Evaluation (Signed)
Occupational Therapy Evaluation Patient Details Name: Philip Thompson MRN: [id number] DOB: 01-Aug-1990 Today's Date: 10/08/2017    History of Present Illness 27 y/o male admitted with colonic inertia/megacolon secondary to constipation and bilateral pulmonary emboli and DVT due to pelvic vein obstruction.  He had acute aspiration pneumonia on 9/10 prior to surgery in the setting of nausea/vomiting.  He went to the OR on 9/10 for total colectomy and ileostomy.  Extubated 9/12, Reintubated 9/13. 9/18 Trached and Cortrak placed PMH Anxiety/depression, hx of substance abuse disorder   Clinical Impression   Pt admitted with above. He demonstrates the below listed deficits and will benefit from continued OT to maximize safety and independence with BADLs.  Pt presents to OT with generalized weakness, decreased activity tolerance - activity limited to bed level due to elevated HR.  He performed UB and LB exercise in chair position with HR 152-166, 02 sats 89-94% on 40% FI02 via trach collar, and BP 138/80.   He demonstrates good UB and LB strength, but currently, cognition is impaired.  Anticipate he will progress well as medical issues improve.  He lived with his dad and was fully independent PTA.  Mother is supportive and will assist as needed at discharge.  Recommend CIR.       Follow Up Recommendations  CIR;Supervision/Assistance - 24 hour    Equipment Recommendations  None recommended by OT    Recommendations for Other Services Rehab consult     Precautions / Restrictions Restrictions Weight Bearing Restrictions: No      Mobility Bed Mobility               General bed mobility comments: Pt in chair position - did not attempt   Transfers                 General transfer comment: Did not attempt due to elevated HR     Balance                                           ADL either performed or assessed with clinical judgement   ADL Overall ADL's :  Needs assistance/impaired Eating/Feeding: NPO   Grooming: Wash/dry hands;Wash/dry face;Set up;Bed level   Upper Body Bathing: Set up;Bed level   Lower Body Bathing: Maximal assistance;Bed level   Upper Body Dressing : Maximal assistance;Bed level   Lower Body Dressing: Maximal assistance;Bed level   Toilet Transfer: Total assistance Toilet Transfer Details (indicate cue type and reason): unable to safely attempt  Toileting- Clothing Manipulation and Hygiene: Total assistance;Bed level         General ADL Comments: Pt limited by lines and tubes as well as HR 152-166      Vision         Perception     Praxis      Pertinent Vitals/Pain Pain Assessment: Faces Faces Pain Scale: Hurts a little bit Pain Location: generalized  Pain Descriptors / Indicators: Grimacing;Guarding Pain Intervention(s): Monitored during session     Hand Dominance Right   Extremity/Trunk Assessment Upper Extremity Assessment Upper Extremity Assessment: Overall WFL for tasks assessed(4+/5 - 5/5 )   Lower Extremity Assessment Lower Extremity Assessment: Defer to PT evaluation       Communication Communication Communication: Tracheostomy;Other (comment)   Cognition Arousal/Alertness: Awake/alert Behavior During Therapy: Flat affect;WFL for tasks assessed/performed Overall Cognitive Status: Impaired/Different from baseline Area of Impairment: Attention;Memory;Following  commands;Awareness;Problem solving                   Current Attention Level: Sustained Memory: Decreased short-term memory Following Commands: Follows one step commands consistently Safety/Judgement: Decreased awareness of safety;Decreased awareness of deficits Awareness: Intellectual Problem Solving: Slow processing;Difficulty sequencing;Requires tactile cues;Requires verbal cues General Comments: Pt is oriented x 4.   He thinks he can discharge home today.      General Comments  Pt weaning on 40% FIO2 via trach  collar.  02 sats 89-94%; HR 154-166     Exercises Exercises: Other exercises Other Exercises Other Exercises: with pt in chair position, pt performed manually resisted shoulder flex/ext, elbow flex/ext x 10 reps each, bil. UEs.  He also performed straigh leg raises x 10 each LE    Shoulder Instructions      Home Living Family/patient expects to be discharged to:: Private residence Living Arrangements: Parent Available Help at Discharge: Family;Available PRN/intermittently Type of Home: House Home Access: Stairs to enter [employer] of Steps: 2 Entrance Stairs-Rails: None Home Layout: One level     Bathroom Shower/Tub: Chief Strategy Officer: Standard     Home Equipment: None   Additional Comments: [name] mother and pt confirmed above info      Prior Functioning/Environment Level of Independence: Independent        Comments: 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[employer], retail Problem List: Decreased strength;Decreased activity tolerance;Impaired balance (sitting and/or standing);Decreased cognition;Decreased safety awareness;Decreased knowledge of use of DME or AE;Cardiopulmonary status limiting activity      OT Treatment/Interventions: Self-care/ADL training;Therapeutic exercise;Energy conservation;DME and/or AE instruction;Therapeutic activities;Cognitive remediation/compensation;Patient/family education;Balance training    OT Goals(Current goals can be found in the care plan section) Acute Rehab OT Goals Patient Stated Goal: to go home today  OT Goal Formulation: With patient Time For Goal Achievement: 10/22/17 Potential to Achieve Goals: Good ADL Goals Pt Will Perform Grooming: with set-up;sitting Pt Will Perform Upper Body Bathing: with set-up;with supervision;sitting Pt Will Perform Lower Body Bathing: with min assist;sit to/from stand Pt Will Perform Upper Body Dressing: with set-up;with supervision;sitting Pt Will Perform Lower Body Dressing:  with min assist;sit to/from stand Pt Will Transfer to Toilet: with min assist;stand pivot transfer;bedside commode Pt Will Perform Toileting - Clothing Manipulation and hygiene: with min assist;sit to/from stand  OT Frequency: Min 2X/week   Barriers to D/C:            Co-evaluation              AM-PAC PT "6 Clicks" Daily Activity     Outcome Measure Help from another person eating meals?: None Help from another person taking care of personal grooming?: A Little Help from another person toileting, which includes using toliet, bedpan, or urinal?: Total Help from another person bathing (including washing, rinsing, drying)?: A Lot Help from another person to put on and taking off regular upper body clothing?: Total Help from another person to put on and taking off regular lower body clothing?: Total 6 Click Score: 12   End of Session Equipment Utilized During Treatment: Oxygen Nurse Communication: Mobility status  Activity Tolerance: Patient tolerated treatment well;Other (comment)(activity limited by HR ) Patient left: in bed;with call bell/phone within reach;with family/visitor present  OT Visit Diagnosis: Cognitive communication deficit (Z61.096)                Time: 0454-0981 OT Time Calculation (min): 17 min Charges:  OT General  Charges $OT Visit: 1 Visit OT Evaluation $OT Eval Moderate Complexity: 1 Mod  Jeani Hawking, OTR/L Acute Rehabilitation Services Pager [phone] Office [phone]   Jeani Hawking M 10/08/2017, 3:29 PM

## 2017-10-08 NOTE — Progress Notes (Signed)
Patient still on 40% ATC, Vent in room on standby.

## 2017-10-08 NOTE — Progress Notes (Signed)
Pt spit in my direction, but then stated he wasn't trying to spit at me.

## 2017-10-08 NOTE — Progress Notes (Signed)
PHARMACY - ADULT TOTAL PARENTERAL NUTRITION CONSULT NOTE   Pharmacy Consult for TPN Indication: massive bowel resection  Patient Measurements: Height: 5' 7.99" (172.7 cm) Weight: 152 lb 8.9 oz (69.2 kg) IBW/kg (Calculated) : 68.38  TPN AdjBW (KG): 77.8 Body mass index is 23.2 kg/m. Usual Weight: 87 kg  Assessment:  [name] admitted on 9/4 with complete colonic inertia, megacolon and severe obstructive constipation. Pt indicates poor intake and weight loss PTA over 1 month time frame. 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 Was found to have a massively distended colon from the cecum all the way to distal rectum and pelvic rim.  No evidence of perforation. Pharmacy has been consulted to start TPN for severe malnutrition related to chronic illness (cardiomyopathy, substance abuse, chronic constipation/obstruction with megacolon) associated with severe muscle and fat depletion and edema.  GI: Ogilvie's syndrome - s/p colectomy and ileostomy 9/10. NPO (since admission) with poor oral intake PTA. TPN initiated 9/12. Pre-albumin improved from <5 to 13.3. JP drain output 131 ml over last 24 hr. Wt 152 lb - down significantly (~55 lbs) since admission. +ostomy with LBM noted 9/21 (2550 large ileostomy output.. CT abd neg 9/14 for new acute process. 9/16 Pt started on TF with Cortrak.Had to have NG placed overnight with output--819ml. TF stopped - Erythromycin 200/tube q 6 hrs, Prostat BID  Endo: hypothyroidism (TSH 6.6 and T4 0.62 on 9/1) - continue levothyroxine. CBGs 107-126. Insulin requirements in the past 24 hours: 2 units SSI  Lytes: Normalized  Renal: SCr stable, UOP 1.1. I/O -15.8L  Pulm: procedural intubation 9/10 - extubated 9/12; re-intubated 9/13. AcutebilateralPE/DVT resumed on anticoagulation. -9/14 Bronch d/t mucus plugging in RLL >> unable to completely remove plug with bronch lavage. Trached on 9/18.  Cards: bilateral PE noted on  admission likely provoked by lower extremity DVT in the setting of pelvic vein compression - was on heparin drip (currently on hold for increased JP drain output which is bloody - s/p IVC filter 9/15 d/t inability to resume heparin). CT did not show evidence of RV strain. LVEF 55-60%. NSR. Tachy. Lovenox started.  Hepatobil: LFTs wnl, tbili wnl, TG wnl  Neuro: history of MDD, substance abuse disorder, ADD - Medications PTA: buspirone, citalopram, divalproex, gabapentin (agitation), quetiapine, and trazodone all resumed. Pt also on scheduled Klonopin and oxycodone  ID: zosyn for possible aspiration, WBC up to 11.2, afeb  TPN Access: PICC triple lumen placed 9/11 TPN start date: 9/12 Nutritional Goals (per RD recommendation on 9/17): KCal: 2318 Protein: 142-156 g Fluid: > 2 L  9/16 pm **Dr. [name] requesting dietitian and pharmacy to meet 80% of estimated calorie needs via TPN while maintaining adequate protein.**  Current Nutrition: Vital AF on HOLD + TPN  Plan:  Tube feeds on hold with high NG output. Resume TPN this evening, requested to be cyclic. Increase dextrose/lipids slightly. - TPN provides 141 gm (~2g/kg) protein and 2184 kcal (~94% estimated kcal needs).  Electrolytes in TPN: same as previous MVI and trace elements in TPN Vit C 500 mg d/t concerns for severe deficiency Pepcid 40 IV in TPN Continue sensitive SSI q6h for now and monitor   Monitor TPN labs Mon/Thurs and prn   Philip Thompson S. Philip Thompson, PharmD, [facility] Clinical Staff Pharmacist [phone] 10/08/2017 8:58 AM

## 2017-10-08 NOTE — Progress Notes (Signed)
Paged Dr. Renae Fickle twice no response. Pt has had 40 mL of urine output since foley was pulled. Pt was bladder scanned and <454ml were found.  Pt is also stated that he doesn't want any further treatment at this time. "The doctors and nurses are planning on killing him because he know we took all the fentanyl." Pt is obviously experiencing some ICU delirium. I had the patient's mother to turn on all the lights and turn on the TV to promote daytime activities.  Pt has calmed down at the moment after reassurance.  Will continue to assess the situation.

## 2017-10-08 NOTE — Progress Notes (Signed)
Philip Thompson  TIW:[id number] DOB: 1990/08/05 DOA: 09/21/2017 PCP: [name], MD    LOS: 16 days    Reason for Consult / Chief Complaint:  Pre op acute hypoxemic respiratory failure   Consulting MD and date:  Philip Thompson  HPI/Summary of hospital stay:  27 y/o male admitted with colonic inertia/megacolon secondary to constipation and bilateral pulmonary emboli and DVT due to pelvic vein obstruction.  He had acute aspiration pneumonia on 9/10 prior to surgery in the setting of nausea/vomiting.  He went to the OR on 9/10 for total colectomy and ileostomy.    Ventilator weaning has been hampered by generalized weakness and periods of agitation and the patient and family's requests for sedation.  Initially started on TPN for nutrition now transitioning to enteral nutrition.  Found to have DVT. Filter placed initially given bleeding from JP drain. IV heparin eventually started and now transitioned to Lovenox. Filter remains in place.  Subjective:  After having been weaned off iv sedation during the day, the patient was again started on Precedex for spitting, kicking and attempting to bite nurses.   Participation in rehabilitation limited by requests for pain medication.  Emesis last pm with placement of NGT and drainage of 1L drainage.  Objective   Blood pressure (!) 149/84, pulse (!) 125, temperature 99.8 F (37.7 C), temperature source Oral, resp. rate (!) 31, height 5' 7.99" (1.727 m), weight 69.2 kg, SpO2 97 %.    Vent Mode: CPAP;PSV FiO2 (%):  [35 %-60 %] 35 % Set Rate:  [22 bmp] 22 bmp Vt Set:  [550 mL] 550 mL PEEP:  [8 cmH20] 8 cmH20 Pressure Support:  [10 cmH20] 10 cmH20 Plateau Pressure:  [20 cmH20] 20 cmH20   Intake/Output Summary (Last 24 hours) at 10/08/2017 2119 Last data filed at 10/08/2017 1800 Gross per 24 hour  Intake 2447.71 ml  Output 4280 ml  Net -1832.29 ml   Filed Weights   10/06/17 0500 10/07/17 0423 10/08/17 0224  Weight: 68.9 kg 69.1 kg 69.2 kg     Examination:  General:  Chronically ill, thin, appearing male on TC in NAD HEENT: MM pink/moist, pupils 3/reactive, midline trach, site cdi with empiric foam dressing Neuro: Eyes open, f/c, MAE, intermittent mild agitation, when calm able to turn in bed with minimal assistance. CV: s1s2 rrr, no m/r/g PULM: even/mild tachpnea on TC, lungs bilaterally coarse, minimal secretions SN:KNLZ, L abd JP with light serosanguionous, midline incision with cdi dressing, R ostomy  Extremities: warm/dry, no edema  Skin: no rashes   Consults: date of consult/date signed off & final recs:  General surgery 9/6 GI 9/6 Critical care 9/10   Procedures: 9/10 - Total colectomy and ileostomy.  Was found to have a massively distended colon from the cecum all the way to distal rectum and pelvic rim.  No evidence of perforation. Oral endotracheal tube 9/10 >> 9/12.  9/13 >  PICC 9/11>>  ETT 9/10 >> 9/12;  9/13 >>9/19 Retrievable IVC filter 9/15 6 shiley trach cuffed 9/18 >>  Assessment & Plan:   Acute respiratory failure with hypoxemia due to nutrition related weakness and mucus plugging compounded by sedation use. Minimize sedation and maximize trach collar trials.   Provoked DVT/PE from bowel obstruction/abdominal compartment syndrome - anticoagulate held d/t JP bled on 9/14  - no evidence of RV strain on CT - S/p retrievable filter 9/15 Continue lovenox and plan to retrieve filter in 4 weeks.   Ogilvie's syndrome s/p colectomy and ileostomy.  Wound healing well. JP out today. Staples out 9/23  Protein calorie malnutrition Did not tolerate full enteral feeding. Resume TPN Erythromycin for gastric dysmotility. Still on considerable sedative medications which are impairing gastric emptying. Post pyloric feeding if continued intolerance of gastric feeding.  Anxiety/depression, hx of substance abuse disorder - very difficult situation to manage ongoing agitation given numerous home pysch  meds and prior substance abuse, patient expects to remain sedated. Continue current enteral regimen.  Avoid IV sedatives.  Have made clear to patient that abusive behavior towards staff is unacceptable and that compliance and participation with rehabilitation are required if he wishes to recover instead of being confined to a nursing home.   Disposition / Summary of Today's Plan 10/08/17    Strict minimization of sedation. Increasing rehabilitation.    Feeding: TPN stopping/ increasing TF Analgesia: precedex, enteral oxy Sedation: RASS goal 0/-1, versed, precedex, seroquel, klonopin, buspar, depakote, Neurontin  Thromboprophylaxis: IVC filter, Lovenox. HOB >30 degrees Ulcer prophylaxis: famotidine Glucose control: CBG q 4, SSI  Family: Mother and sister updated at bedside 9/20, both are very attentive to his needs.   Labs and ancillary test (personally reviewed)   Stable mild leukocytosis and anemia. CXR shows mild atelectasis.  Lynnell Catalan, MD [facility] ICU Physician [facility] [place] [facility]  Pager: [phone] Mobile: [phone] After hours: [phone].   10/08/2017, 9:19 PM

## 2017-10-08 NOTE — Progress Notes (Signed)
Pt had not voided since foley was removed this morning. Bladder scan done at 8:30 showed of urine. Pt asked to try to void. Pt voided of yellow cloudy urine. Post bladder scan showed 12ml in bladder. Will continue to monitor.

## 2017-10-08 NOTE — Progress Notes (Signed)
Pt requested to be placed on the vent at 2145 and has been complaining of having trouble breathing ever since. Placed pt on CPAP/PS mode but backup rate would kick in and full support was provided. Pt then requested to go back on ATC and after RT placed pt on pt was able to cough up copious thick secretions through trach. Pt is no longer complaining of SOB and agitation has calmed a little as well. HR-115, 96% on 40% ATC, and RR-17. RT to cont to monitor and vent at bedside in case of Respiratory Distress. RN aware.

## 2017-10-08 NOTE — Progress Notes (Signed)
eLink Physician-Brief Progress Note Patient Name: Philip Thompson DOB: 1990/08/30 MRN: [id number]   Date of Service  10/08/2017  HPI/Events of Note  Nursing concerned about HR = 128 and RR in mid 30's. Patient is currently on 35% Trach collar O2.  Sat = 98%.  eICU Interventions  Will order: 1. ABG now.      Intervention Category Major Interventions: Respiratory failure - evaluation and management  Sommer,Steven Eugene 10/08/2017, 11:14 PM

## 2017-10-08 NOTE — Progress Notes (Signed)
Rehab Admissions Coordinator Note:  Per PT recommendation, Patient was screened by Nanine Means for appropriateness for an Inpatient Acute Rehab Consult.  At this time, we will follow at a distance and await medical stability and progress with therapy prior to potentially requesting IP Rehab Consult Order.   Nanine Means 10/08/2017, 10:04 AM  I can be reached at [phone].

## 2017-10-09 LAB — BASIC METABOLIC PANEL
Anion gap: 12 (ref 5–15)
BUN: 16 mg/dL (ref 6–20)
CALCIUM: 9.1 mg/dL (ref 8.9–10.3)
CHLORIDE: 102 mmol/L (ref 98–111)
CO2: 25 mmol/L (ref 22–32)
CREATININE: 0.43 mg/dL — AB (ref 0.61–1.24)
GFR calc non Af Amer: >60 mL/min (ref >60)
Glucose, Bld: 83 mg/dL (ref 70–99)
Potassium: 3.4 mmol/L — ABNORMAL LOW (ref 3.5–5.1)
Sodium: 139 mmol/L (ref 135–145)

## 2017-10-09 LAB — GLUCOSE, CAPILLARY
GLUCOSE-CAPILLARY: 117 mg/dL — AB (ref 70–99)
GLUCOSE-CAPILLARY: 116 mg/dL — AB (ref 70–99)
Glucose-Capillary: 116 mg/dL — ABNORMAL HIGH (ref 70–99)
Glucose-Capillary: 110 mg/dL — ABNORMAL HIGH (ref 70–99)
Glucose-Capillary: 108 mg/dL — ABNORMAL HIGH (ref 70–99)
Glucose-Capillary: 129 mg/dL — ABNORMAL HIGH (ref 70–99)

## 2017-10-09 LAB — CBC
HCT: 36.8 % — ABNORMAL LOW (ref 39.0–52.0)
Hemoglobin: 11.9 g/dL — ABNORMAL LOW (ref 13.0–17.0)
MCH: 29 pg (ref 26.0–34.0)
MCHC: 32.3 g/dL (ref 30.0–36.0)
MCV: 89.5 fL (ref 78.0–100.0)
Platelets: 314 10*3/uL (ref 150–400)
RBC: 4.11 MIL/uL — ABNORMAL LOW (ref 4.22–5.81)
RDW: 14.3 % (ref 11.5–15.5)
WBC: 25.7 10*3/uL — ABNORMAL HIGH (ref 4.0–10.5)

## 2017-10-09 LAB — POCT I-STAT 3, ART BLOOD GAS (G3+)
ACID-BASE EXCESS: 2 mmol/L (ref 0.0–2.0)
BICARBONATE: 25.2 mmol/L (ref 20.0–28.0)
O2 Saturation: 96 %
TCO2: 26 mmol/L (ref 22–32)
pCO2 arterial: 38.1 mmHg (ref 32.0–48.0)
pH, Arterial: 7.433 (ref 7.350–7.450)
pO2, Arterial: 80 mmHg — ABNORMAL LOW (ref 83.0–108.0)

## 2017-10-09 MED ORDER — POTASSIUM CHLORIDE 10 MEQ/50ML IV SOLN
10.0000 meq | INTRAVENOUS | Status: AC
  Administered 2017-10-09 (×4): 10 meq via INTRAVENOUS
  Filled 2017-10-09 (×4): qty 50

## 2017-10-09 MED ORDER — CEFAZOLIN SODIUM-DEXTROSE 2-4 GM/100ML-% IV SOLN
2.0000 g | Freq: Three times a day (TID) | INTRAVENOUS | Status: AC
  Administered 2017-10-09 – 2017-10-16 (×22): 2 g via INTRAVENOUS
  Filled 2017-10-09 (×23): qty 100

## 2017-10-09 MED ORDER — TRAVASOL 10 % IV SOLN
INTRAVENOUS | Status: AC
  Administered 2017-10-09: 18:00:00 via INTRAVENOUS
  Filled 2017-10-09: qty 1413.6

## 2017-10-09 MED ORDER — ALTEPLASE 2 MG IJ SOLR
2.0000 mg | Freq: Once | INTRAMUSCULAR | Status: AC
  Administered 2017-10-09: 2 mg

## 2017-10-09 MED ORDER — MELATONIN 3 MG PO TABS
9.0000 mg | ORAL_TABLET | Freq: Every evening | ORAL | Status: DC | PRN
  Administered 2017-10-09 – 2017-10-16 (×7): 9 mg
  Filled 2017-10-09 (×9): qty 3

## 2017-10-09 NOTE — Progress Notes (Signed)
Pharmacy Antibiotic Note  Philip Thompson is a 27 y.o. male admitted on 09/21/2017 with abdominal pain.  Pharmacy has been consulted for cefazolin dosing. Pt with Tmax 100.5 and WBC is elevated at 25.7. SCr is WNL. BAL with staph (no sensitivities yet) and Ecoli.   Plan: Cefazolin 2gm IV Q8H F/u renal fxn, C&S, clinical status   Height: 5' 7.99" (172.7 cm) Weight: 143 lb 8.3 oz (65.1 kg) IBW/kg (Calculated) : 68.38  Temp (24hrs), Avg:99.7 F (37.6 C), Min:99.3 F (37.4 C), Max:100.5 F (38.1 C)  Recent Labs  Lab 10/05/17 0531 10/05/17 0833 10/05/17 0834 10/06/17 0415 10/06/17 0445 10/08/17 0435 10/09/17 0448  WBC 10.4 10.9*  --   --  11.2* 14.2* 25.7*  CREATININE 0.61  --  0.57* 0.55*  --  0.48* 0.43*    Estimated Creatinine Clearance: 127.7 mL/min (A) (by C-G formula based on SCr of 0.43 mg/dL (L)).    No Known Allergies  Antimicrobials this admission: Cefazolin 9/22>> Zosyn 9/10>>9/19  Dose adjustments this admission: N/A  Microbiology results: 9/10 MRSA PCR - negative 9/11 TA - normal flora 9/18 BAL cx: 30k Staph aureus + Ecoli  Thank you for allowing pharmacy to be a part of this patient's care.  Jaymes Hang, Rande Lawman 10/09/2017 4:42 PM

## 2017-10-09 NOTE — Progress Notes (Signed)
Philip Thompson  TGG:[id number] DOB: Jul 30, 1990 DOA: 09/21/2017 PCP: [name], MD    LOS: 17 days    Reason for Consult / Chief Complaint:  Pre op acute hypoxemic respiratory failure   Consulting MD and date:  Philip Thompson  HPI/Summary of hospital stay:  27 y/o male admitted with colonic inertia/megacolon secondary to constipation and bilateral pulmonary emboli and DVT due to pelvic vein obstruction.  He had acute aspiration pneumonia on 9/10 prior to surgery in the setting of nausea/vomiting.  He went to the OR on 9/10 for total colectomy and ileostomy.    Ventilator weaning has been hampered by generalized weakness and periods of agitation and the patient and family's requests for sedation.  Initially started on TPN for nutrition now transitioning to enteral nutrition.  Found to have DVT. Filter placed initially given bleeding from JP drain. IV heparin eventually started and now transitioned to Lovenox. Filter remains in place.  Subjective:  Asleep>> precedex  After having been weaned off iv sedation during the day, the patient was again started on Precedex for spitting, kicking and attempting to bite nurses.  High output drainage from NG and ileostomy At risk for metabolic alkalosis  Participation in rehabilitation limited by requests for pain medication.    Objective   Blood pressure 110/72, pulse (!) 116, temperature 99.3 F (37.4 C), temperature source Oral, resp. rate (!) 31, height 5' 7.99" (1.727 m), weight 65.1 kg, SpO2 97 %.    FiO2 (%):  [35 %-40 %] 40 %   Intake/Output Summary (Last 24 hours) at 10/09/2017 1541 Last data filed at 10/09/2017 1524 Gross per 24 hour  Intake 4390.92 ml  Output 3885 ml  Net 505.92 ml   Filed Weights   10/07/17 0423 10/08/17 0224 10/09/17 0448  Weight: 69.1 kg 69.2 kg 65.1 kg    Examination:  General:  Chronically ill, thin, appearing male on ATC , sedated with precedex, in NAD HEENT: MM pink/moist, pupils 3/reactive,  midline trach, Secure,  site cdi with empiric foam dressing Neuro: Awakens to call of name, follows commands, MAE x 4, intermittent mild agitation, better with precedex. CV: s1s2 rrr, no m/r/g, ST per tele PULM: Bilateral chest excursion, mild tachpnea on ATC, lungs coarse throughout, minimal secretions VO:JJKK,, midline incision with cdi dressing, R ileostomy with pink stoma, bilious drainage  Extremities: warm/dry, no edema  Skin: no rashes   Consults: date of consult/date signed off & final recs:  General surgery 9/6 GI 9/6 Critical care 9/10   Procedures: 9/10 - Total colectomy and ileostomy.  Was found to have a massively distended colon from the cecum all the way to distal rectum and pelvic rim.  No evidence of perforation. Oral endotracheal tube 9/10 >> 9/12.  9/13 >  PICC 9/11>>  ETT 9/10 >> 9/12;  9/13 >>9/19 Retrievable IVC filter 9/15 6 shiley trach cuffed 9/18 >>  Assessment & Plan:   Acute respiratory failure with hypoxemia due to nutrition related weakness and mucus plugging compounded by sedation use. TC at 40% Plan Minimize sedation and maximize trach collar trials. Precedex only at bedtime Off at 7 am each morning Suction prn Mobilize as able   Provoked DVT/PE from bowel obstruction/abdominal compartment syndrome - anticoagulate held d/t JP bled on 9/14  - no evidence of RV strain on CT - S/p retrievable filter 9/15 - Continue lovenox and plan to retrieve filter in 4 weeks. - Monitor for bleeding - Monitor CBC   ID WBC bump  to 25000 New low grade fever x 2 days + BAL culture for Staph Aureus and E coli Plan: Ancef per pharmacy Trend fever and WBC Culture as is clinically indicated   Ogilvie's syndrome s/p colectomy and ileostomy.  Wound healing well. JP out  Staples out 9/23  Protein calorie malnutrition Did not tolerate full enteral feeding. Resume TPN Post pyloric feeding if continued intolerance of gastric feeding. Cortrack placement  9/23 for post pyloric feeds  High Output Ileostomy/ NG Bilious from both sites 4L out last 24 hours At  Risk metabolic alkalosis Plan: BMET to monitor bicarb Discontinue Erythromycin for now [facility] documentation of output Ensure adequate IV fluid intake, may need to replace cc per cc if continues    Anxiety/depression, hx of substance abuse disorder - very difficult situation to manage ongoing agitation given numerous home pysch meds and prior substance abuse, patient expects to remain sedated. Continue current enteral regimen.  Avoid IV sedatives. Precedex only at bedtime Will add melatonin at bedtime   Dr. Lynetta Mare  made clear to patient that abusive behavior towards staff is unacceptable and that compliance and participation with rehabilitation are required if he wishes to recover instead of being confined to a nursing home.   Disposition / Summary of Today's Plan 10/09/17    Strict minimization of sedation. Increasing rehabilitation. Start Ancef for + BAL ( E coli and Staph Aureus) Precedex only at bedtime Add Melatonin for sleep    Feeding: TPN / TF on hold Analgesia: precedex, enteral oxy Sedation: RASS goal 0/-1, versed, precedex, seroquel, klonopin, buspar, depakote, Neurontin  Thromboprophylaxis: IVC filter, Lovenox. HOB >30 degrees Ulcer prophylaxis: famotidine Glucose control: CBG q 4, SSI  Family: Father updated at bedside 9/22  Labs and ancillary test (personally reviewed)   BAL + for Staph Aureus and E Coli  Magdalen Spatz, AGACNP-BC [place]  Pager: [phone].   10/09/2017, 3:41 PM

## 2017-10-09 NOTE — Progress Notes (Signed)
CPT held at 2315 due to pt's HR in the 130's.

## 2017-10-09 NOTE — Progress Notes (Signed)
PHARMACY - ADULT TOTAL PARENTERAL NUTRITION CONSULT NOTE   Pharmacy Consult for TPN Indication: massive bowel resection  Patient Measurements: Height: 5' 7.99" (172.7 cm) Weight: 143 lb 8.3 oz (65.1 kg) IBW/kg (Calculated) : 68.38  TPN AdjBW (KG): 77.8 Body mass index is 21.83 kg/m. Usual Weight: 87 kg  Assessment:  [name] admitted on 9/4 with complete colonic inertia, megacolon and severe obstructive constipation. Pt indicates poor intake and weight loss PTA over 1 month time frame. 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 Was found to have a massively distended colon from the cecum all the way to distal rectum and pelvic rim.  No evidence of perforation. Pharmacy has been consulted to start TPN for severe malnutrition related to chronic illness (cardiomyopathy, substance abuse, chronic constipation/obstruction with megacolon) associated with severe muscle and fat depletion and edema.  GI: Ogilvie's syndrome - s/p colectomy and ileostomy 9/10. NPO (since admission) with poor oral intake PTA. TPN initiated 9/12. Pre-albumin improved from <5 to 13.3. JP drain now out.  +ostomy with LBM noted 9/21 (2550>232ml large ileostomy output. CT abd neg 9/14 for new acute process. 9/16 Pt started on TF with Cortrak.Had to have NG placed overnight 9/21 with output . TF stopped 9/21. Plan Post pyloric feeding if continued intolerance of gastric feeding.  - Erythromycin 200/tube q 6 hrs, Prostat BID  Endo: hypothyroidism (TSH 6.6 and T4 0.62 on 9/1) - continue levothyroxine. CBGs 116-153 since TPN restarted last PM Insulin requirements in the past 24 hours: 1 units SSI  Lytes: K 3.4 low with significant NG output  Renal: SCr stable, UOP 0.7. I/O -16.5L  Pulm: procedural intubation 9/10 - extubated 9/12; re-intubated 9/13. AcutebilateralPE/DVT resumed on anticoagulation. -9/14 Bronch d/t mucus plugging in RLL >> unable to completely remove plug with bronch  lavage. Trached on 9/18.  Cards: bilateral PE noted on admission likely provoked by lower extremity DVT in the setting of pelvic vein compression - was on heparin drip (held for bloody JP drainage 9/14- s/p IVC filter 9/15 d/t inability to resume heparin). CT did not show evidence of RV strain. LVEF 55-60%. NSR. Tachy. Lovenox started.  Hepatobil: LFTs wnl, tbili wnl, TG wnl  Neuro: history of MDD, substance abuse disorder, ADD - Medications PTA: buspirone, citalopram, divalproex, gabapentin (agitation), quetiapine, and trazodone all resumed. Pt also on scheduled Klonopin and oxycodone. Experiencing some ICU delirium and abusive to staff.  ID: s/p zosyn for possible aspiration, WBC up 11.2>14.2>25.7, Tmax 100.5 off abx  TPN Access: PICC triple lumen placed 9/11 TPN start date: 9/12 Nutritional Goals (per RD recommendation on 9/17): KCal: 2318 Protein: 142-156 g Fluid: > 2 L  9/16 pm **Dr. [name] requesting dietitian and pharmacy to meet 80% of estimated calorie needs via TPN while maintaining adequate protein.**  Current Nutrition: Vital AF on HOLD + TPN  Plan:  Tube feeds on hold with high NG output. Resumed TPN 9/21, requested to be cyclic. Increase dextrose/lipids slightly. - TPN provides 141 gm (~2g/kg) protein and 2184 kcal (~94% estimated kcal needs).  Electrolytes in TPN: Increase K+ IV K+ runs x 4 MVI and trace elements in TPN Vit C 500 mg d/t concerns for severe deficiency Pepcid 40 IV in TPN Continue sensitive SSI q6h for now and monitor   Monitor TPN labs Mon/Thurs and prn Need postpyloric FT replaced, may heve to go to IR.  Probably willl not be done today.   Harmonie Verrastro S. Merilynn Finland, PharmD, BCPS Clinical Staff Pharmacist  [phone] 10/09/2017 8:21 AM

## 2017-10-09 NOTE — Progress Notes (Signed)
[facility] Progress Note 12 Days Post-Op  Subjective: Patient is alert and awake.  Had NGT placed night before last with high output.  Tube feedings have been held and the patient does not have a post-pyloric feeding tube.  Objective: Vital signs in last 24 hours: Temp:  [99.3 F (37.4 C)-100.5 F (38.1 C)] 99.3 F (37.4 C) (09/22 0326) Pulse Rate:  [98-158] 116 (09/22 0734) Resp:  [16-44] 26 (09/22 0734) BP: (124-162)/(68-97) 132/81 (09/22 0734) SpO2:  [88 %-99 %] 94 % (09/22 0734) FiO2 (%):  [35 %-40 %] 40 % (09/22 0734) Weight:  [65.1 kg] 65.1 kg (09/22 0448) Last BM Date: 10/08/17  Intake/Output from previous day: 09/21 0701 - 09/22 0700 In: 4566 [I.V.:4566] Out: 4240 [Urine:1135; Emesis/NG output:2470; Stool:635] Intake/Output this shift: No intake/output data recorded.  General: No distress.   Lungs: Coarse breath sounds on the right  Abd: Soft, no bowel sounds.  Has a right nostril Cortrak and left nostril [place] sump  High output from [place] sump over 2 liers in the past 24 hours.  Extremities: No changes  Neuro: Intact  Lab Results:  @LABLAST2 (wbc:2,hgb:2,hct:2,plt:2) BMET ) Recent Labs    10/08/17 0435 10/09/17 0448  NA 135 139  K 4.0 3.4*  CL 97* 102  CO2 28 25  GLUCOSE 117* 83  BUN 11 16  CREATININE 0.48* 0.43*  CALCIUM 8.9 9.1   PT/INR No results for input(s): LABPROT, INR in the last 72 hours. ABG Recent Labs    10/09/17 0010  PHART 7.433  HCO3 25.2    Studies/Results: Dg Abd 1 View  Result Date: 10/08/2017 CLINICAL DATA:  NG tube placement, bowel obstruction EXAM: ABDOMEN - 1 VIEW COMPARISON:  10/01/2017, 09/27/2017 FINDINGS: NG tube within the proximal stomach. Feeding tube coiled in the stomach fundus as well. Diffuse gaseous distention of the bowel. Ostomy in the right lower quadrant. Bowel gas pattern remains nonspecific for mild obstruction versus ileus. Retained stool in the residual rectum. Midline staples noted. IVC filter present.  Surgical drain in the pelvis. IMPRESSION: Stable bowel gas distension remaining nonspecific for mild obstruction versus ileus. NG tube in the proximal stomach. Electronically Signed   By: Judie Petit.  Shick M.D.   On: 10/08/2017 09:27    Anti-infectives: Anti-infectives (From admission, onward)   Start     Dose/Rate Route Frequency Ordered Stop   10/08/17 0815  erythromycin ethylsuccinate (EES) 200 MG/5ML suspension 200 mg     200 mg Per Tube Every 6 hours 10/08/17 0807 10/13/17 0559   09/27/17 1000  piperacillin-tazobactam (ZOSYN) IVPB 3.375 g  Status:  Discontinued     3.375 g 12.5 mL/hr over 240 Minutes Intravenous Every 8 hours 09/27/17 0845 10/06/17 1504      Assessment/Plan: s/p Procedure(s): SUBTOTAL COLECTOMY AND ILEOSTOMY Need postpyloric FT replaced, may heve to go to IR.  Probably willl not be done today.  LOS: 17 days   Marta Lamas. Gae Bon, MD, FACS [phone] [facility] 10/09/2017

## 2017-10-09 NOTE — Consult Note (Addendum)
WOC Nurse wound consult note Reason for Consult: Consulted to perform differential diagnosis for new intragluteal (coccygeal)  tissue loss.  Wound type: New Stage 3 PrI Pressure Injury POA: No Measurement: 3cm x 2.2cm x 0.2cm Wound bed: pink, moist Drainage (amount, consistency, odor) small amount serous Periwound: intact. moist Dressing procedure/placement/frequency: I will recommend a silver hydrofiber (Aquacel Ag+) as a wound contact layer and cover with the silicone foam dressing. Side to side positioning is in place, but patient is very mobile in his bed.  He is also with an altered microclimate (perspiration), despite being on a mattress replacement with low air loss feature.  WOC Nurse ostomy follow up Stoma type/location:  ileostomy Stomal assessment/size: 1 and 1/4 inch round, minimally raised Peristomal assessment: intact Treatment options for stomal/peristomal skin: skin barrier ring Output: Dark green/brown liquid effluent Ostomy pouching: 1pc convex ostomy pouch with skin barrier ring.  Education provided: None today  Enrolled patient in [employer] Discharge program: No  Pouches ordered to bedside (3 pouches, 3 rings)  WOC nursing team will follow, and will remain available to this patient, the nursing and medical teams.    Thanks, Ladona Mow, MSN, RN, GNP, [place]  Pager# [phone]

## 2017-10-10 ENCOUNTER — Inpatient Hospital Stay (HOSPITAL_COMMUNITY): Payer: BLUE CROSS/BLUE SHIELD

## 2017-10-10 DIAGNOSIS — E43 Unspecified severe protein-calorie malnutrition: Secondary | ICD-10-CM

## 2017-10-10 LAB — CBC
HCT: 36 % — ABNORMAL LOW (ref 39.0–52.0)
HEMOGLOBIN: 11.4 g/dL — AB (ref 13.0–17.0)
MCH: 28.7 pg (ref 26.0–34.0)
MCHC: 31.7 g/dL (ref 30.0–36.0)
MCV: 90.7 fL (ref 78.0–100.0)
PLATELETS: 269 10*3/uL (ref 150–400)
RBC: 3.97 MIL/uL — AB (ref 4.22–5.81)
RDW: 14.4 % (ref 11.5–15.5)
WBC: 18.2 10*3/uL — AB (ref 4.0–10.5)

## 2017-10-10 LAB — GLUCOSE, CAPILLARY
Glucose-Capillary: 95 mg/dL (ref 70–99)
Glucose-Capillary: 108 mg/dL — ABNORMAL HIGH (ref 70–99)
Glucose-Capillary: 112 mg/dL — ABNORMAL HIGH (ref 70–99)

## 2017-10-10 LAB — COMPREHENSIVE METABOLIC PANEL
ALK PHOS: 98 U/L (ref 38–126)
ALT: 63 U/L — AB (ref 0–44)
AST: 30 U/L (ref 15–41)
Albumin: 2.2 g/dL — ABNORMAL LOW (ref 3.5–5.0)
Anion gap: 9 (ref 5–15)
BUN: 17 mg/dL (ref 6–20)
CALCIUM: 8.8 mg/dL — AB (ref 8.9–10.3)
CO2: 25 mmol/L (ref 22–32)
CREATININE: 0.5 mg/dL — AB (ref 0.61–1.24)
Chloride: 103 mmol/L (ref 98–111)
Glucose, Bld: 127 mg/dL — ABNORMAL HIGH (ref 70–99)
Potassium: 3.7 mmol/L (ref 3.5–5.1)
SODIUM: 137 mmol/L (ref 135–145)
Total Bilirubin: 0.4 mg/dL (ref 0.3–1.2)
Total Protein: 6.4 g/dL — ABNORMAL LOW (ref 6.5–8.1)

## 2017-10-10 LAB — TSH: TSH: 3.1 u[IU]/mL (ref 0.350–4.500)

## 2017-10-10 MED ORDER — LIDOCAINE VISCOUS HCL 2 % MT SOLN
15.0000 mL | Freq: Once | OROMUCOSAL | Status: AC
  Administered 2017-10-10: 2 mL via OROMUCOSAL
  Filled 2017-10-10: qty 15

## 2017-10-10 MED ORDER — TRAVASOL 10 % IV SOLN
INTRAVENOUS | Status: AC
  Administered 2017-10-10: 18:00:00 via INTRAVENOUS
  Filled 2017-10-10: qty 1413.6

## 2017-10-10 MED ORDER — CLONAZEPAM 1 MG PO TABS
2.0000 mg | ORAL_TABLET | Freq: Three times a day (TID) | ORAL | Status: DC
  Administered 2017-10-10 – 2017-10-11 (×3): 2 mg
  Filled 2017-10-10 (×3): qty 2

## 2017-10-10 MED ORDER — SODIUM CHLORIDE 0.9 % IV BOLUS
1000.0000 mL | Freq: Once | INTRAVENOUS | Status: AC
  Administered 2017-10-10: 1000 mL via INTRAVENOUS

## 2017-10-10 MED ORDER — POTASSIUM CHLORIDE 10 MEQ/50ML IV SOLN
10.0000 meq | INTRAVENOUS | Status: AC
  Administered 2017-10-10 (×2): 10 meq via INTRAVENOUS
  Filled 2017-10-10 (×2): qty 50

## 2017-10-10 MED ORDER — LIDOCAINE VISCOUS HCL 2 % MT SOLN
OROMUCOSAL | Status: AC
  Filled 2017-10-10: qty 15

## 2017-10-10 MED ORDER — IOPAMIDOL (ISOVUE-300) INJECTION 61%
INTRAVENOUS | Status: AC
  Filled 2017-10-10: qty 50

## 2017-10-10 MED ORDER — IOPAMIDOL (ISOVUE-300) INJECTION 61%
25.0000 mL | Freq: Once | INTRAVENOUS | Status: AC | PRN
  Administered 2017-10-10: 10 mL via ORAL

## 2017-10-10 MED ORDER — VITAL AF 1.2 CAL PO LIQD
1000.0000 mL | ORAL | Status: DC
  Administered 2017-10-10: 1000 mL

## 2017-10-10 MED ORDER — POTASSIUM CHLORIDE 10 MEQ/50ML IV SOLN
10.0000 meq | Freq: Once | INTRAVENOUS | Status: DC
  Filled 2017-10-10: qty 50

## 2017-10-10 NOTE — Progress Notes (Signed)
Physical Therapy Treatment Patient Details Name: Philip Thompson MRN: [id number] DOB: 10/13/1990 Today's Date: 10/10/2017    History of Present Illness 27 y/o male admitted with colonic inertia/megacolon secondary to constipation and bilateral pulmonary emboli and DVT due to pelvic vein obstruction.  He had acute aspiration pneumonia on 9/10 prior to surgery in the setting of nausea/vomiting.  He went to the OR on 9/10 for total colectomy and ileostomy.  Extubated 9/12, Reintubated 9/13. 9/18 Trached and Cortrak placed PMH Anxiety/depression, hx of substance abuse disorder    PT Comments    Pt is excited about getting out of bed with therapy today. Pt limited in increased mobility by elevated HR with activity (see General Comments). Pt requires maxA for bed mobility, maxAx2 for transfers and lateral stepping to recliner. Pt very appreciative to be out of bed. Pt educated on need for additional rehab before going home. D/c plans remain appropriate.     Follow Up Recommendations  CIR     Equipment Recommendations  Other (comment)(TBD)       Precautions / Restrictions Precautions Precautions: Fall Restrictions Weight Bearing Restrictions: No    Mobility  Bed Mobility Overal bed mobility: Needs Assistance Bed Mobility: Supine to Sit     Supine to sit: Max assist     General bed mobility comments: pt able to initiate LE movement toward EoB, requires maxA for trunk to upright and hip scoot toward EoB  Transfers Overall transfer level: Needs assistance Equipment used: 2 person hand held assist Transfers: Sit to/from Stand Sit to Stand: Max assist;+2 physical assistance         General transfer comment: maxAx2 for power up and steadying in standing  Ambulation/Gait Ambulation/Gait assistance: Max assist;+2 physical assistance Gait Distance (Feet): 2 Feet Assistive device: 2 person hand held assist Gait Pattern/deviations: Step-to pattern;Shuffle Gait velocity:  slowed Gait velocity interpretation: <1.31 ft/sec, indicative of household ambulator General Gait Details: maxAx2 for stepping to recliner, vc for sequencing, increased time required         Balance Overall balance assessment: Needs assistance Sitting-balance support: Bilateral upper extremity supported;Feet supported Sitting balance-Leahy Scale: Zero     Standing balance support: Bilateral upper extremity supported Standing balance-Leahy Scale: Zero                              Cognition Arousal/Alertness: Awake/alert Behavior During Therapy: Flat affect;WFL for tasks assessed/performed Overall Cognitive Status: Impaired/Different from baseline Area of Impairment: Attention;Memory;Following commands;Awareness;Problem solving                   Current Attention Level: Sustained Memory: Decreased short-term memory Following Commands: Follows one step commands consistently Safety/Judgement: Decreased awareness of safety;Decreased awareness of deficits Awareness: Intellectual Problem Solving: Slow processing;Difficulty sequencing;Requires tactile cues;Requires verbal cues General Comments: oriented x4, however continues to have limited awareness of his deficits          General Comments General comments (skin integrity, edema, etc.): prior to activity HR 153 bpm, MD request pt to chair despite elevated HR, pt assymptomatic,  with activity HR increased to 178 bpm, with sitting in recliner HR dropped back to 150 bpm, pt weaning off vent FiO2 40%O2, 10 L O2, SaO2 >95%O2       Pertinent Vitals/Pain Pain Assessment: No/denies pain Pain Score: 4  Pain Location: abdomen Pain Descriptors / Indicators: Grimacing;Guarding Pain Intervention(s): Limited activity within patient's tolerance;Monitored during session;Repositioned  PT Goals (current goals can now be found in the care plan section) Acute Rehab PT Goals Patient Stated Goal: to go home today   PT Goal Formulation: With patient Time For Goal Achievement: 10/21/17 Potential to Achieve Goals: Good Progress towards PT goals: Progressing toward goals    Frequency    Min 3X/week      PT Plan Current plan remains appropriate       AM-PAC PT "6 Clicks" Daily Activity  Outcome Measure  Difficulty turning over in bed (including adjusting bedclothes, sheets and blankets)?: Unable Difficulty moving from lying on back to sitting on the side of the bed? : Unable Difficulty sitting down on and standing up from a chair with arms (e.g., wheelchair, bedside commode, etc,.)?: Unable Help needed moving to and from a bed to chair (including a wheelchair)?: Total Help needed walking in hospital room?: Total Help needed climbing 3-5 steps with a railing? : Total 6 Click Score: 6    End of Session Equipment Utilized During Treatment: Oxygen Activity Tolerance: Patient tolerated treatment well Patient left: in chair;with call bell/phone within reach;with family/visitor present;with chair alarm set Nurse Communication: Mobility status PT Visit Diagnosis: Unsteadiness on feet (R26.81);Other abnormalities of gait and mobility (R26.89);Muscle weakness (generalized) (M62.81);Difficulty in walking, not elsewhere classified (R26.2);Pain Pain - part of body: Ankle and joints of foot(abdomen)     Time: 3545-6256 PT Time Calculation (min) (ACUTE ONLY): 17 min  Charges:  $Therapeutic Activity: 8-22 mins                     [name] B. Beverely Risen PT, DPT Acute Rehabilitation Services Pager [phone] Office [phone]    [name] 10/10/2017, 3:14 PM

## 2017-10-10 NOTE — Evaluation (Signed)
[facility] Speaking Valve - Evaluation Patient Details  Name: Philip Thompson MRN: [id number] Date of Birth: [birth year]  Today's Date: 10/10/2017 Time: 0383-3383 SLP Time Calculation (min) (ACUTE ONLY): 45 min  Past Medical History:  Past Medical History:  Diagnosis Date  . ADD (attention deficit disorder)   . Aggression   . Allergic rhinitis   . Anxiety   . Arthritis    joint pain  . Cardiomyopathy (HCC)    a. EF <25% in 2015 during admission for multi substance intoxication/respiratory failure requiring intubation.  . Chronic back pain   . Constipation   . Depression   . Fecal obstruction (HCC)   . Frontal head injury   . Heroin overdose (HCC)   . Obstipation   . Polysubstance abuse (HCC)   . Suicide attempt [facility])    Past Surgical History:  Past Surgical History:  Procedure Laterality Date  . COLECTOMY N/A 09/27/2017   Procedure: SUBTOTAL COLECTOMY AND ILEOSTOMY;  Surgeon: Abigail Miyamoto, MD;  Location: [facility] OR;  Service: General;  Laterality: N/A;  . FOOT SURGERY     to get a BB out  . IR IVC FILTER PLMT / S&I /IMG GUID/MOD SED  10/02/2017  . NO PAST SURGERIES     HPI:  27 y/o male admitted with colonic inertia/megacolon secondary to constipation and bilateral pulmonary emboli and DVT due to pelvic vein obstruction.  He had acute aspiration pneumonia on 9/10 prior to surgery in the setting of nausea/vomiting.  He went to the OR on 9/10 for total colectomy and ileostomy.  Extubated 9/12, Reintubated 9/13. 9/18 Trached and Cortrak placed PMH Anxiety/depression, hx of substance abuse disorder.  CXR 9/23: "Right lower lobe collapse/consolidative change. Given volume loss, at least partially felt to represent atelectasis. Infection or aspiration cannot be excluded."   Assessment / Plan / Recommendation Clinical Impression  Pt has Shiley #6 cuffed trach.  Pt able to phonate around trach prior to placement of valve with fair vocal quality.  Pt exhibited strong voice to finger  occlusion of trach.  Pt tolerated placement of PMV for ~45 minutes with only brief removal for donning/doffing training with pt and family.  Pt was able to phonate without difficulty (briefly singing for SLP and mother).  There was no breath stacking noted and pt demonstrated no discomfort or increased work of breathing.  [name] vital signs remained stable during evaluation.  Pt has high HR and RR at baseline, per NP, this has been consistent with HR reaching 160s even at rest.  Mother trained with donning/doffing. She was able to recall reasons for removing PMV independently.  Pt is able to doff speaking valve independently, but requires assistance putting valve back on.  At present, pt should wear valve with trained caregiver present or with intermittent supervision from staff when awake and cuff is deflated.  Consider changing to cuffless trach as appropriate.  SLP Visit Diagnosis: Aphonia (R49.1)    SLP Assessment  Patient needs continued Speech Lanaguage Pathology Services    Follow Up Recommendations       Frequency and Duration min 2x/week       PMSV Trial PMSV was placed for: 45 minutes Able to redirect subglottic air through upper airway: Yes Able to Attain Phonation: Yes Voice Quality: Normal Able to Expectorate Secretions: No attempts Breath Support for Phonation: Adequate Intelligibility: Intelligible Respirations During Trial: 28 SpO2 During Trial: 97 % Pulse During Trial: 132 Behavior: Alert;Cooperative;Expresses self well;Good eye contact;Responsive to questions   Tracheostomy Tube  Vent Dependency  FiO2 (%): 40 %    Cuff Deflation Trial  GO Behavior: Alert;Cooperative;Expresses self well;Good eye contact;Responsive to questions        Kerrie Pleasure, MA, CCC-SLP Acute Rehabilitation Services Office: [phone] 10/10/2017, 4:05 PM

## 2017-10-10 NOTE — Progress Notes (Addendum)
Philip Thompson  GGY:[id number] DOB: 01/23/1990 DOA: 09/21/2017 PCP: [name], MD    LOS: 18 days    Reason for Consult / Chief Complaint:  Pre op acute hypoxemic respiratory failure   Consulting MD and date:  [name]  HPI/Summary of hospital stay:  27 y/o male admitted with colonic inertia/megacolon secondary to constipation and bilateral pulmonary emboli and DVT due to pelvic vein obstruction.  He had acute aspiration pneumonia on 9/10 prior to surgery in the setting of nausea/vomiting.  He went to the OR on 9/10 for total colectomy and ileostomy.    Ventilator weaning has been hampered by generalized weakness, periods of agitation and the patient / family's requests for sedation.  Initially started on TPN for nutrition now transitioning to enteral nutrition.  Found to have DVT. Filter placed initially given bleeding from JP drain. IV heparin eventually started and now transitioned to Lovenox. Filter remains in place.   9/22 - started on precedex for agitation, spitting at staff/kicking, attempting to bite  Subjective:  RN reports tachycardia > staying in 120's.  Objective   Blood pressure 128/76, pulse (!) 123, temperature 98.7 F (37.1 C), temperature source Oral, resp. rate (!) 26, height 5' 7.99" (1.727 m), weight 67.2 kg, SpO2 96 %.    Vent Mode: Stand-by FiO2 (%):  [10 %-40 %] 40 %   Intake/Output Summary (Last 24 hours) at 10/10/2017 1129 Last data filed at 10/10/2017 1121 Gross per 24 hour  Intake 4535.22 ml  Output 2700 ml  Net 1835.22 ml   Filed Weights   10/08/17 0224 10/09/17 0448 10/10/17 0350  Weight: 69.2 kg 65.1 kg 67.2 kg    Examination:  General:  Chronically ill appearing male in NAD HEENT: MM pink/moist, temporal wasting, trach midline, sutures intace Neuro: Awake, alert / oriented, talks around trach  CV: s1s2 rrr, no m/r/g, tachycardia  PULM: even/non-labored, lungs bilaterally clear  OJ:JKKX, non-tender, bsx4 active, midline abd  dressing c/d/i, staples intact  Extremities: warm/dry, no edema  Skin: no rashes or lesions    Consults: date of consult/date signed off & final recs:  General surgery 9/6 GI 9/6 Critical care 9/10   Procedures: 9/10 - Total colectomy and ileostomy.  Was found to have a massively distended colon from the cecum all the way to distal rectum and pelvic rim.  No evidence of perforation. Oral endotracheal tube 9/10 >> 9/12.  9/13 >  PICC 9/11>>  ETT 9/10 >> 9/12;  9/13 >>9/19 Retrievable IVC filter 9/15 6 shiley trach cuffed 9/18 >>  Assessment & Plan:   Acute respiratory failure with hypoxemia due to nutrition related weakness and mucus plugging compounded by sedation use. P: Continue ATC trials  Pulmonary hygiene as able - mobilize Follow intermittent CXR  Trach care per protocol  Minimize sedation  Provoked DVT/PE from bowel obstruction/abdominal compartment syndrome - anticoagulation held d/t JP bled on 9/14  - no evidence of RV strain on CT - S/p retrievable filter 9/15 P: Continue lovenox  Plan to retrieve filter in 4 weeks (~ 10/15) Monitor for further bleeding Trend CBC  Tachycardia - mild fever, possible volume depletion  P: 1L NS bolus x1 now  Maintenance IVF D51/2NS at 40m/hr  PRN tylenol for fever  Monitor cultures   Leukocytosis  + BAL culture for Staph Aureus and E coli.  RLL collapse/atelectasis P: Trend WBC, fever curve  Ancef per pharmacy, D2/7  Ogilvie's syndrome s/p colectomy and ileostomy -JP drain out, staples out  9/23 P: CCS following  Wound care per protocol   Protein calorie malnutrition -did not tolerate trials of enteral feeding  P: Appreciate IR assistance for post-pyloric feeding tube placement  Begin post pyloric TF, advance slowly pending tolerance  Continue TPN per pharmacy, reduce as tolerates enteral feeding   High Output Ileostomy/ NG Bilious from both sites At Risk Alkalosis  P: Monitor bicarb on BMP Monitor I/O's,  GI output   Anxiety/depression, hx of substance abuse disorder - very difficult situation to manage ongoing agitation given numerous home pysch meds and prior substance abuse, patient expects to remain sedated. P: Increase klonopin to 65m PT Q8  Discontinue precedex   Hypothyroidism  P: Assess TSH with tachycardia  Continue synthroid     Disposition / Summary of Today's Plan 10/10/17   Mobilize with PT.  Discontinue precedex.  Fluid bolus with tachycardia.      Feeding: TPN / TF   Analgesia: precedex, enteral oxy Sedation: RASS goal 0/-1, versed, precedex, seroquel, klonopin, buspar, depakote, Neurontin  Thromboprophylaxis: IVC filter, Lovenox. HOB >30 degrees Ulcer prophylaxis: famotidine Glucose control: CBG q 4, SSI Family: Mother and patient updated on plan of care 9/23  Labs and ancillary test (personally reviewed)   Recent Labs  Lab 10/05/17 0531 10/05/17 0834 10/06/17 0415 10/08/17 0435 10/09/17 0448 10/10/17 0343  NA 131* 140 138 135 139 137  K >7.5* 4.6 4.0 4.0 3.4* 3.7  CL 103 107 107 97* 102 103  CO2 20* _0 GLUCOSE 754* 104* 116* 117* 83 127*  BUN _1 CREATININE 0.61 0.57* 0.55* 0.48* 0.43* 0.50*  CALCIUM 8.2* 8.7* 7.8* 8.9 9.1 8.8*  MG 2.5* 2.0 1.8 2.0  --   --   PHOS 8.6* 4.8* 4.3 3.7  --   --     Recent Labs  Lab 10/08/17 0435 10/09/17 0448 10/10/17 0343  HGB 11.4* 11.9* 11.4*  HCT 36.2* 36.8* 36.0*  WBC 14.2* 25.7* 18.2*  PLT 217 314 269    BNoe Gens NP-C [facility] Pgr: [phone] or if no answer [phone]/23/2019, 12:15 PM  Attending Note:  27year old male with extensive PMH above presenting to PCCM with respiratory failure.  N/V overnight.  Tachycardic and hypotensive this AM.  18 liters negative.  On exam, coarse BS diffusely.  I reviewed CXR myself, trach is in a good position with RLL atelectasis noted.  Will give a liter bolus for hypotension and tachycardia.  Place cortrak  and reattempt TF.  Maintain on TC as tolerated.  Hold in the ICU.  Will need to determine if PEG will be needed.  Appreciate input from surgery.  PCCM will continue to follow.  The patient is critically ill with multiple organ systems failure and requires high complexity decision making for assessment and support, frequent evaluation and titration of therapies, application of advanced monitoring technologies and extensive interpretation of multiple databases.   Critical Care Time devoted to patient care services described in this note is  34  Minutes. This time reflects time of care of this signee Dr WJennet Maduro This critical care time does not reflect procedure time, or teaching time or supervisory time of PA/NP/Med student/Med Resident etc but could involve care discussion time.  WRush Farmer M.D. LM[facility] LLCPulmonary/Critical Care Medicine. Pager: [phone] After hours pager: [phone]

## 2017-10-10 NOTE — Progress Notes (Signed)
Nutrition Follow-up  DOCUMENTATION CODES:   Severe malnutrition in context of chronic illness  INTERVENTION:   Continue TPN per Pharmacy at goal rate to meet >90%-100% of estimated needs; nutrition needs re-estimated now that pt on trach collar  Initiate trickle TF of Vital AF 1.2 @ 20 ml/hr   NUTRITION DIAGNOSIS:   Severe Malnutrition related to chronic illness(cardiomyopathy, substance abuse, chronic constipation/obstipation with megacolon) as evidenced by severe fat depletion, severe muscle depletion.  Being addressed via nutrition support  GOAL:   Patient will meet greater than or equal to 90% of their needs  Progressing  MONITOR:   TF tolerance, I & O's, Labs, Skin, Vent status, Diet advancement  REASON FOR ASSESSMENT:   Ventilator    ASSESSMENT:   27 yo male admitted 9/4 for megacolon and severe obstructive constipation with severely dilated colon and colitis. Noted pt admitted to [facility] at the end of August with constipation x 3 weeks with megacolon with medical therapy attempts, discharged on 9/2. Pt also with chest pain with bilateral PE on admissionPMH includes polysubstance abuse, drug-induced cardiomyopathy, anxiety, chronic obstipation/constipation   9/10 Total colectomy with ileostomy, Vent 9/11 PICC line inserted 9/12 TPN initiated, Extubated 9/13 Re-Intubated 9/16 Trickle feedings started 9/18 Trach placed, Cortrak  9/21 TPN re-started, NG tube for decompression, TF held 9/23 Diagnostic Radiology advanced Cortrak tube to post-pyloric position  Pt now tolerating trach collar >24 hours, up to the chair with assistance today. Pt smiling, gave this RD a thumbs up today. Pt now with stage III pressure injury No longer appropriate for permissive underfeeding Recommend meeting 100% estimated nutrition needs via TPN at this time until TF tolerance established and able to begin titration to goal  NG tube placed on night shift 9/20 due to vomiting, 1L  bilious return, TF held Ileostomy output 1 L, noted TF have been on hold. UOP 1.2 L in 24 hours Monitor Ileostomy output with re-initiation of TF  Currently on cyclic TPN; provides 2184 kcals, 141 g of protein.   Net negative 15.5 L since admission; weight trend has leveled off.   Labs: reviewed Meds: D5-1/2 NS at 75 ml/hr (308 kcals/day), precedex  Diet Order:   Diet Order    None      EDUCATION NEEDS:   Not appropriate for education at this time  Skin:  Skin Assessment: Skin Integrity Issues: Skin Integrity Issues:: Stage III Stage III: intragluteal (coccygeal) Incisions: abdomen  Last BM:  stool via ileostomy   Height:   Ht Readings from Last 1 Encounters:  09/27/17 5' 7.99" (1.727 m)    Weight:   Wt Readings from Last 1 Encounters:  10/10/17 67.2 kg    Ideal Body Weight:  70 kg  BMI:  Body mass index is 22.53 kg/m.  Estimated Nutritional Needs:   Kcal:  2200-2400 kcals   Protein:  138-152 g  Fluid:  >/= 2 L   Romelle Starcher MS, RD, LDN, CNSC [phone] Pager  [phone] Weekend/On-Call Pager

## 2017-10-10 NOTE — Progress Notes (Signed)
PHARMACY - ADULT TOTAL PARENTERAL NUTRITION CONSULT NOTE   Pharmacy Consult for TPN Indication: massive bowel resection  Patient Measurements: Height: 5' 7.99" (172.7 cm) Weight: 148 lb 2.4 oz (67.2 kg) IBW/kg (Calculated) : 68.38  TPN AdjBW (KG): 77.8 Body mass index is 22.53 kg/m. Usual Weight: 87 kg  Assessment:  [name] admitted on 9/4 with complete colonic inertia, megacolon and severe obstructive constipation. Pt indicates poor intake and weight loss PTA over 1 month time frame. 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 Was found to have a massively distended colon from the cecum all the way to distal rectum and pelvic rim.  No evidence of perforation. Pharmacy has been consulted to start TPN for severe malnutrition related to chronic illness (cardiomyopathy, substance abuse, chronic constipation/obstruction with megacolon) associated with severe muscle and fat depletion and edema.   GI: Ogilvie's syndrome - s/p colectomy and ileostomy 9/10. NPO (since admission) with poor oral intake PTA. TPN initiated 9/12. Pre-albumin improved from <5 to 13.3. JP drain now out.  +ostomy with LBM noted 9/22 (1076m large ileostomy output. CT abd neg 9/14 for new acute process. 9/16 Pt started on TF with Cortrak. TF stopped 9/21. Plan Post pyloric feeding if continued intolerance of gastric feeding.  - Erythromycin 200/tube q 6 hrs, Prostat BID  Endo: hypothyroidism (TSH 6.6 and T4 0.62 on 9/1) - continue levothyroxine. CBGs 95-129 Insulin requirements in the past 24 hours: 1 units SSI  Lytes: K 3.7 (improved some after increase ibn TPN and repletion outside TPN). CoCa 10.2-watch.  TPN labs were discontinued- will re-order.   Renal: SCr stable, UOP 0.7. I/O -16.5L  Pulm: procedural intubation 9/10 - extubated 9/12; re-intubated 9/13. AcutebilateralPE/DVT resumed on anticoagulation. -9/14 Bronch d/t mucus plugging in RLL >> unable to completely remove plug  with bronch lavage. Trached on 9/18.  Cards: bilateral PE noted on admission likely provoked by lower extremity DVT in the setting of pelvic vein compression - was on heparin drip (held for bloody JP drainage 9/14- s/p IVC filter 9/15 d/t inability to resume heparin). CT did not show evidence of RV strain. LVEF 55-60%. ST in 120s. Lovenox started.  Hepatobil: ALT up at 63, tbili wnl, TG wnl  Neuro: history of MDD, substance abuse disorder, ADD - Medications PTA: buspirone, citalopram, divalproex, gabapentin (agitation), quetiapine, and trazodone all resumed. Pt also on scheduled Klonopin and oxycodone. Experiencing some ICU delirium and abusive to staff- Precedex off.  ID: s/p zosyn for possible aspiration, WBC up 18.2, Tmax 100.5 off abx 9/18 BAL -20K Ecoli, 30K St.aureus  TPN Access: PICC triple lumen placed 9/11 TPN start date: 9/12 Nutritional Goals (per RD recommendation on 9/17): KCal: 2318 Protein: 142-156 g Fluid: > 2 L  9/16 pm **Dr. ALynetta Marerequesting dietitian and pharmacy to meet 80% of estimated calorie needs via TPN while maintaining adequate protein.**  9/23 Plan for post-pyloric panda tube placement by IR when able  Current Nutrition: Vital AF on HOLD + TPN  Plan:  Tube feeds remain on hold with high NG output. Resumed TPN 9/21, requested to be cyclic. Increase dextrose/lipids slightly. - TPN provides 141 gm (~2g/kg) protein and 2184 kcal (~94% estimated kcal needs).  Electrolytes in TPN: Increase K+ IV K+ runs x 2 MVI and trace elements in TPN Vit C 500 mg d/t concerns for severe deficiency Pepcid 40 IV in TPN Continue sensitive SSI q6h for now and monitor   Monitor TPN labs -reorder for Tuesday since  discontinued; then Mon/Thurs   Sloan Leiter, PharmD, BCPS, BCCCP Clinical Pharmacist Clinical phone 10/10/2017 until 3:30PM [phone] Please refer to [facility] for [place] numbers 10/10/2017 7:32 AM

## 2017-10-10 NOTE — Progress Notes (Signed)
Cortrak Tube Team Note:  Received a call from diagnostic radiology regarding Bridle placement.   A 10 F Cortrak tube was repositioned by diagnostic radiology in the RIGHT nare and secured with a nasal bridle by Cortrak team at 85 cm.   Vanessa Kick RD, LDN Clinical Nutrition Pager # [phone]

## 2017-10-10 NOTE — Progress Notes (Signed)
13 Days Post-Op   Subjective/Chief Complaint: On HTC, asking to go home   Objective: Vital signs in last 24 hours: Temp:  [98.5 F (36.9 C)-100 F (37.8 C)] 98.7 F (37.1 C) (09/23 0712) Pulse Rate:  [96-138] 115 (09/23 0800) Resp:  [12-33] 25 (09/23 0800) BP: (110-145)/(69-88) 137/80 (09/23 0800) SpO2:  [94 %-100 %] 95 % (09/23 0800) FiO2 (%):  [10 %-40 %] 10 % (09/23 0749) Weight:  [67.2 kg] 67.2 kg (09/23 0350) Last BM Date: 10/09/17(ostomy)  Intake/Output from previous day: 09/22 0701 - 09/23 0700 In: 4217.4 [I.V.:3761.7; NG/GT:160; IV Piggyback:295.7] Out: 2320 [Urine:1320; Stool:1000] Intake/Output this shift: No intake/output data recorded.  General appearance: cooperative Neck: trach with white secresions Cardio: regular rate and rhythm GI: soft, ileostomy pink with output, incision OK, +BS, NT  Lab Results:  Recent Labs    10/09/17 0448 10/10/17 0343  WBC 25.7* 18.2*  HGB 11.9* 11.4*  HCT 36.8* 36.0*  PLT 314 269   BMET Recent Labs    10/09/17 0448 10/10/17 0343  NA 139 137  K 3.4* 3.7  CL 102 103  CO2 25 25  GLUCOSE 83 127*  BUN 16 17  CREATININE 0.43* 0.50*  CALCIUM 9.1 8.8*   PT/INR No results for input(s): LABPROT, INR in the last 72 hours. ABG Recent Labs    10/09/17 0010  PHART 7.433  HCO3 25.2    Studies/Results: Dg Chest Port 1 View  Result Date: 10/10/2017 CLINICAL DATA:  Respiratory failure and shortness of breath. EXAM: PORTABLE CHEST 1 VIEW COMPARISON:  10/16/2016. FINDINGS: Nasogastric and feeding tubes extend beyond the inferior aspect of the film. right-sided PICC line terminates at low SVC. Tracheostomy appropriately position. Normal heart size. Possible small right pleural effusion. No pneumothorax. Clear left lung. Interval development of collapse/consolidative change in the right lower lobe with volume loss. IMPRESSION: Right lower lobe collapse/consolidative change. Given volume loss, at least partially felt to  represent atelectasis. Infection or aspiration cannot be excluded. Electronically Signed   By: Jeronimo Greaves M.D.   On: 10/10/2017 07:38    Anti-infectives: Anti-infectives (From admission, onward)   Start     Dose/Rate Route Frequency Ordered Stop   10/09/17 1730  ceFAZolin (ANCEF) IVPB 2g/100 mL premix     2 g 200 mL/hr over 30 Minutes Intravenous Every 8 hours 10/09/17 1641     10/08/17 0815  erythromycin ethylsuccinate (EES) 200 MG/5ML suspension 200 mg  Status:  Discontinued     200 mg Per Tube Every 6 hours 10/08/17 0807 10/09/17 1636   09/27/17 1000  piperacillin-tazobactam (ZOSYN) IVPB 3.375 g  Status:  Discontinued     3.375 g 12.5 mL/hr over 240 Minutes Intravenous Every 8 hours 09/27/17 0845 10/06/17 1504      Assessment/Plan: S/P subtotal colectomy with ileostomy 9/10 Dr. Magnus Ivan - D/C staples. Gastric atony, will ask IR to advance panda post-pyloric PE - Lovenox Acute resp failure - on HTC, per CCM PT/OT  I spoke with his mother at the bedside.   LOS: 18 days    Liz Malady 10/10/2017

## 2017-10-10 NOTE — Progress Notes (Signed)
Patient became agitated with delusions of people trying to hurt him. The patient was able to remove restraints himself and attempted to get out bed. This RN was able to keep the patient safely in bed and calm his anxiety. However, the NGT was removed by patient. ELink notified. Will replace NGT. Patient is resting calming in bed. Will continue to monitor.

## 2017-10-11 ENCOUNTER — Inpatient Hospital Stay (HOSPITAL_COMMUNITY): Payer: BLUE CROSS/BLUE SHIELD

## 2017-10-11 DIAGNOSIS — Z930 Tracheostomy status: Secondary | ICD-10-CM

## 2017-10-11 DIAGNOSIS — G894 Chronic pain syndrome: Secondary | ICD-10-CM

## 2017-10-11 DIAGNOSIS — E46 Unspecified protein-calorie malnutrition: Secondary | ICD-10-CM

## 2017-10-11 DIAGNOSIS — G931 Anoxic brain damage, not elsewhere classified: Secondary | ICD-10-CM

## 2017-10-11 DIAGNOSIS — F1910 Other psychoactive substance abuse, uncomplicated: Secondary | ICD-10-CM

## 2017-10-11 DIAGNOSIS — Z430 Encounter for attention to tracheostomy: Secondary | ICD-10-CM

## 2017-10-11 DIAGNOSIS — Z87820 Personal history of traumatic brain injury: Secondary | ICD-10-CM

## 2017-10-11 DIAGNOSIS — D72829 Elevated white blood cell count, unspecified: Secondary | ICD-10-CM

## 2017-10-11 DIAGNOSIS — R0682 Tachypnea, not elsewhere classified: Secondary | ICD-10-CM

## 2017-10-11 DIAGNOSIS — R000 Tachycardia, unspecified: Secondary | ICD-10-CM

## 2017-10-11 DIAGNOSIS — F332 Major depressive disorder, recurrent severe without psychotic features: Secondary | ICD-10-CM

## 2017-10-11 LAB — CBC
HEMATOCRIT: 35.8 % — AB (ref 39.0–52.0)
Hemoglobin: 11.1 g/dL — ABNORMAL LOW (ref 13.0–17.0)
MCH: 28.2 pg (ref 26.0–34.0)
MCHC: 31 g/dL (ref 30.0–36.0)
MCV: 90.9 fL (ref 78.0–100.0)
Platelets: 318 10*3/uL (ref 150–400)
RBC: 3.94 MIL/uL — AB (ref 4.22–5.81)
RDW: 14.1 % (ref 11.5–15.5)
WBC: 13 10*3/uL — AB (ref 4.0–10.5)

## 2017-10-11 LAB — COMPREHENSIVE METABOLIC PANEL
ALBUMIN: 2.3 g/dL — AB (ref 3.5–5.0)
ALT: 50 U/L — AB (ref 0–44)
ANION GAP: 9 (ref 5–15)
AST: 20 U/L (ref 15–41)
Alkaline Phosphatase: 116 U/L (ref 38–126)
BILIRUBIN TOTAL: 0.4 mg/dL (ref 0.3–1.2)
BUN: 17 mg/dL (ref 6–20)
CALCIUM: 8.9 mg/dL (ref 8.9–10.3)
CO2: 25 mmol/L (ref 22–32)
Chloride: 102 mmol/L (ref 98–111)
Creatinine, Ser: 0.46 mg/dL — ABNORMAL LOW (ref 0.61–1.24)
GFR calc non Af Amer: >60 mL/min (ref >60)
Glucose, Bld: 106 mg/dL — ABNORMAL HIGH (ref 70–99)
Potassium: 4.3 mmol/L (ref 3.5–5.1)
Sodium: 136 mmol/L (ref 135–145)
Total Protein: 6.3 g/dL — ABNORMAL LOW (ref 6.5–8.1)

## 2017-10-11 LAB — DIFFERENTIAL
Abs Immature Granulocytes: 0.2 10*3/uL — ABNORMAL HIGH (ref 0.0–0.1)
Basophils Absolute: 0.1 10*3/uL (ref 0.0–0.1)
Basophils Relative: 0 %
EOS PCT: 1 %
Eosinophils Absolute: 0.1 10*3/uL (ref 0.0–0.7)
IMMATURE GRANULOCYTES: 1 %
LYMPHS PCT: 10 %
Lymphs Abs: 1.3 10*3/uL (ref 0.7–4.0)
Monocytes Absolute: 1.9 10*3/uL — ABNORMAL HIGH (ref 0.1–1.0)
Monocytes Relative: 14 %
Neutro Abs: 9.6 10*3/uL — ABNORMAL HIGH (ref 1.7–7.7)
Neutrophils Relative %: 74 %

## 2017-10-11 LAB — MAGNESIUM: MAGNESIUM: 1.8 mg/dL (ref 1.7–2.4)

## 2017-10-11 LAB — GLUCOSE, CAPILLARY
GLUCOSE-CAPILLARY: 122 mg/dL — AB (ref 70–99)
GLUCOSE-CAPILLARY: 99 mg/dL (ref 70–99)
GLUCOSE-CAPILLARY: 97 mg/dL (ref 70–99)
Glucose-Capillary: 101 mg/dL — ABNORMAL HIGH (ref 70–99)
Glucose-Capillary: 106 mg/dL — ABNORMAL HIGH (ref 70–99)
Glucose-Capillary: 110 mg/dL — ABNORMAL HIGH (ref 70–99)
Glucose-Capillary: 117 mg/dL — ABNORMAL HIGH (ref 70–99)

## 2017-10-11 LAB — PREALBUMIN: Prealbumin: 19.3 mg/dL (ref 18–38)

## 2017-10-11 LAB — PHOSPHORUS: PHOSPHORUS: 3.3 mg/dL (ref 2.5–4.6)

## 2017-10-11 LAB — TRIGLYCERIDES: Triglycerides: 45 mg/dL (ref <150)

## 2017-10-11 MED ORDER — QUETIAPINE FUMARATE 25 MG PO TABS
200.0000 mg | ORAL_TABLET | Freq: Every day | ORAL | Status: DC
  Administered 2017-10-11 – 2017-10-20 (×8): 200 mg
  Filled 2017-10-11: qty 8
  Filled 2017-10-11 (×6): qty 1
  Filled 2017-10-11: qty 8

## 2017-10-11 MED ORDER — MAGNESIUM SULFATE IN D5W 1-5 GM/100ML-% IV SOLN
1.0000 g | Freq: Once | INTRAVENOUS | Status: AC
  Administered 2017-10-11: 1 g via INTRAVENOUS
  Filled 2017-10-11: qty 100

## 2017-10-11 MED ORDER — CLONAZEPAM 1 MG PO TABS
1.0000 mg | ORAL_TABLET | Freq: Three times a day (TID) | ORAL | Status: DC
  Administered 2017-10-11 – 2017-10-17 (×18): 1 mg
  Filled 2017-10-11 (×18): qty 1

## 2017-10-11 MED ORDER — VITAL AF 1.2 CAL PO LIQD
1000.0000 mL | ORAL | Status: DC
  Administered 2017-10-11: 1000 mL

## 2017-10-11 MED ORDER — NICOTINE 21 MG/24HR TD PT24
21.0000 mg | MEDICATED_PATCH | Freq: Every day | TRANSDERMAL | Status: DC
  Administered 2017-10-11 – 2017-10-21 (×11): 21 mg via TRANSDERMAL
  Filled 2017-10-11 (×11): qty 1

## 2017-10-11 MED ORDER — TRAVASOL 10 % IV SOLN
INTRAVENOUS | Status: AC
  Administered 2017-10-11: 18:00:00 via INTRAVENOUS
  Filled 2017-10-11: qty 1116

## 2017-10-11 NOTE — Plan of Care (Signed)

## 2017-10-11 NOTE — Progress Notes (Signed)
Pt sitting on chair.  CPT not perform. Will perform CPT when pt is back in the bed.

## 2017-10-11 NOTE — Progress Notes (Addendum)
Philip Thompson  QQV:[id number] DOB: 06/05/1990 DOA: 09/21/2017 PCP: [name], MD    LOS: 19 days    Reason for Consult / Chief Complaint:  Pre op acute hypoxemic respiratory failure   Consulting MD and date:  [name]  HPI/Summary of hospital stay:  27 y/o male admitted with colonic inertia/megacolon secondary to constipation and bilateral pulmonary emboli and DVT due to pelvic vein obstruction.  He had acute aspiration pneumonia on 9/10 prior to surgery in the setting of nausea/vomiting.  He went to the OR on 9/10 for total colectomy and ileostomy.    Ventilator weaning has been hampered by generalized weakness, periods of agitation and the patient / family's requests for sedation.  Initially started on TPN for nutrition now transitioning to enteral nutrition.  Found to have DVT. Filter placed initially given bleeding from JP drain. IV heparin eventually started and now transitioned to Lovenox. Filter remains in place.   9/22 - started on precedex for agitation, spitting at staff/kicking, attempting to bite 9/23 - more oriented, klonopin increased / did not tolerate   Subjective:  Tmax 100.8.  Off vent for >48 hours.  Pt reports insomnia / anxiety at night.    Objective   Blood pressure 116/77, pulse (!) 136, temperature 98.6 F (37 C), temperature source Oral, resp. rate (!) 31, height 5' 7.99" (1.727 m), weight 71.2 kg, SpO2 97 %.    FiO2 (%):  [35 %-40 %] 35 %   Intake/Output Summary (Last 24 hours) at 10/11/2017 0851 Last data filed at 10/11/2017 0800 Gross per 24 hour  Intake 6591.29 ml  Output 5200 ml  Net 1391.29 ml   Filed Weights   10/09/17 0448 10/10/17 0350 10/11/17 0500  Weight: 65.1 kg 67.2 kg 71.2 kg    Examination:  General: young adult male lying in bed in NAD HEENT: MM pink/moist, trach midline with dried secretions at site, ATC in lace, NGT, small bore feeding tube in place Neuro: AAOx4, speech clear, MAE, appropriate  CV: s1s2 rrr, no  m/r/g PULM: even/non-labored, lungs bilaterally clear  GI: midline incision c/d/i, stoma pink  Extremities: warm/dry, no edema  Skin: no rashes or lesions  Consults: date of consult/date signed off & final recs:  General surgery 9/6 GI 9/6 Critical care 9/10   Procedures: 9/10 - Total colectomy and ileostomy.  Was found to have a massively distended colon from the cecum all the way to distal rectum and pelvic rim.  No evidence of perforation. Oral endotracheal tube 9/10 >> 9/12.  9/13 >  PICC 9/11>>  ETT 9/10 >> 9/12;  9/13 >> 9/19 Retrievable IVC filter 9/15 6 shiley trach cuffed 9/18 >>  Assessment & Plan:   Acute respiratory failure with hypoxemia due to nutrition related weakness and mucus plugging compounded by sedation use. P: ATC as tolerated  Vent as rest mode only  Pulmonary hygiene- mobilize/ambulate with PT, OOB to chair daily  Trach care per protocol  Intermittent CXR  Provoked DVT/PE from bowel obstruction/abdominal compartment syndrome - anticoagulation held d/t JP bled on 9/14  - no evidence of RV strain on CT - S/p retrievable filter 9/15 P: Lovenox for PE/DVT IR to retrieve filter in 4 weeks (~ 10/15) Trend CBC  Monitor for bleeding  Tachycardia - mild fever, possible volume depletion  P: D5 1/2NS at 79mhr  PRN tylenol for fever  Follow cultures, abd exam   Leukocytosis  + BAL culture for Staph Aureus and E coli.  RLL  collapse/atelectasis P: Ancef D3/7 Follow cultures   Ogilvie's syndrome s/p colectomy and ileostomy -JP drain out, staples out 9/23 P: CCS following Wound care / post op needs per CCS  Protein calorie malnutrition -did not tolerate initial trials of enteral feeding  P: Appreciate IR assistance  Continue post pyloric TF  TPN per pharmacy until TF tolerated  High Output Ileostomy/ NG Bilious from both sites At Risk Alkalosis  P: Trend BMP, monitor bicarb  Monitor I/O's, GI output   Anxiety/depression, hx of  substance abuse disorder -difficult situation to manage agitation given numerous home pysch meds and prior substance abuse.   P: Reduce Klonopin to 60m PT Q8  Continue celexa, gabapentin, oxycodone, seroquel, trazodone, valproic acid & melatonin  At some point, consider reduction of meds  Hypothyroidism - TSH 3.1 9/23 P: Continue synthroid     Disposition / Summary of Today's Plan 10/11/17   Reduce klonopin dosing.  Continue ATC.  Defer NGT to CCS.  CIR consult for rehab efforts.  To TRH as of 9/25.  PCCM will continue to monitor for trach care.     Feeding: TPN / TF   Analgesia: enteral oxy Sedation: RASS goal 0   Thromboprophylaxis: IVC filter, Lovenox. HOB >30 degrees Ulcer prophylaxis: famotidine Glucose control: CBG q 4, SSI Family: Mother and patient updated on plan of care 9/24  Labs and ancillary test (personally reviewed)   Recent Labs  Lab 10/05/17 0531 10/05/17 0834 10/06/17 0415 10/08/17 0435 10/09/17 0448 10/10/17 0343 10/11/17 0557  NA 131* 140 138 135 139 137 136  K >7.5* 4.6 4.0 4.0 3.4* 3.7 4.3  CL 103 107 107 97* 102 103 102  CO2 20* _0 GLUCOSE 754* 104* 116* 117* 83 127* 106*  BUN _1 CREATININE 0.61 0.57* 0.55* 0.48* 0.43* 0.50* 0.46*  CALCIUM 8.2* 8.7* 7.8* 8.9 9.1 8.8* 8.9  MG 2.5* 2.0 1.8 2.0  --   --  1.8  PHOS 8.6* 4.8* 4.3 3.7  --   --  3.3    Recent Labs  Lab 10/09/17 0448 10/10/17 0343 10/11/17 0557  HGB 11.9* 11.4* 11.1*  HCT 36.8* 36.0* 35.8*  WBC 25.7* 18.2* 13.0*  PLT 314 269 318    BNoe Gens NP-C  Pulmonary & Critical Care Pgr: [phone] or if no answer [phone]/24/2019, 8:51 AM  Attending Note:  27year old male with extensive PMH s/p colectomy and tracheostomy.  He feels better this AM and tolerated TC x72 hours.  Off pressors but remains in sinus tach with clear lungs on exam.  I reviewed CXR myself, trach is in a good position.  Discussed with PCCM-NP.  Respiratory  failure:  - Maintain on TC as tolerated  Trach status:  - Maintain cuffed trach for now  - If passes SLP successfully and remains off vent by Thursday or Friday then will consider change to a cuffless 6  Short gut:  - TPN  - Monitor input from TF  Hypomag:  - MgSO4 2 gm IV  - Labs in AM  Sinus tach:  - Start beta blockers  [facility] for [facility]ach management 9/26.  Patient seen and examined, agree with above note.  I dictated the care and orders written for this patient under my direction.  YRush Farmer [place]

## 2017-10-11 NOTE — Progress Notes (Signed)
Physical Therapy Treatment Patient Details Name: Philip Thompson MRN: [id number] DOB: March 15, 1990 Today's Date: 10/11/2017    History of Present Illness 27 y/o male admitted with colonic inertia/megacolon secondary to constipation and bilateral pulmonary emboli and DVT due to pelvic vein obstruction.  He had acute aspiration pneumonia on 9/10 prior to surgery in the setting of nausea/vomiting.  He went to the OR on 9/10 for total colectomy and ileostomy.  Extubated 9/12, Reintubated 9/13. 9/18 Trached and Cortrak placed PMH Anxiety/depression, hx of substance abuse disorder    PT Comments    Pt eager to work with therapy and ambulate today, however pt continues to be limited in safe mobility by tachycardia with mobility (see General Comments) and generalized deconditioning. Pt requires maxA for bed mobility, modAx2 for transfers and modAx2 for ambulation of 8 feet with RW. Pt is frustrated in not being able to ambulate further. PT will continue to work with pt to progress his mobility as his medical status improves.   Follow Up Recommendations  CIR     Equipment Recommendations  Other (comment)(TBD)    Recommendations for Other Services       Precautions / Restrictions Precautions Precautions: Fall Restrictions Weight Bearing Restrictions: No    Mobility  Bed Mobility Overal bed mobility: Needs Assistance Bed Mobility: Supine to Sit     Supine to sit: Max assist     General bed mobility comments: pt moves LE towards EoB, however needs max A to bring trunk to upright   Transfers Overall transfer level: Needs assistance Equipment used: 2 person hand held assist Transfers: Sit to/from Stand Sit to Stand: +2 physical assistance;Mod assist         General transfer comment: modAx2 for power up and steadying at RW, multimodal cues for correction of posterior lean  Ambulation/Gait Ambulation/Gait assistance: +2 physical assistance;Mod assist Gait Distance (Feet): 8  Feet Assistive device: Rolling walker (2 wheeled) Gait Pattern/deviations: Step-to pattern;Shuffle;Decreased stride length;Leaning posteriorly Gait velocity: slowed Gait velocity interpretation: <1.31 ft/sec, indicative of household ambulator General Gait Details: maxAx2 for steadying with posterior lean, vc for sequencing, ambulation limited by increase in HR to 176bpm           Balance Overall balance assessment: Needs assistance Sitting-balance support: Bilateral upper extremity supported;Feet supported Sitting balance-Leahy Scale: Poor     Standing balance support: Bilateral upper extremity supported Standing balance-Leahy Scale: Zero Standing balance comment: requires therapist assist to maintain balance                            Cognition Arousal/Alertness: Awake/alert Behavior During Therapy: Flat affect;WFL for tasks assessed/performed Overall Cognitive Status: Impaired/Different from baseline Area of Impairment: Attention;Memory;Following commands;Awareness;Problem solving                   Current Attention Level: Sustained Memory: Decreased short-term memory Following Commands: Follows one step commands consistently Safety/Judgement: Decreased awareness of safety;Decreased awareness of deficits Awareness: Intellectual Problem Solving: Slow processing;Difficulty sequencing;Requires tactile cues;Requires verbal cues General Comments: continues to think he can go home today, despite all the tubes and lines          General Comments General comments (skin integrity, edema, etc.): Pt on trach collar support 35%FiO2 8L O2, pt able to maintain SaO2 >91%O2 throughout session, pt continues to have elevated HR with activity. Supine at rest HR 135 bpm, with ambulation max HR 176 bpm, with sitting HR dropped back to 156 bpm  Pertinent Vitals/Pain Pain Assessment: Faces Faces Pain Scale: Hurts little more Pain Location: abdomen Pain Descriptors /  Indicators: Grimacing;Guarding Pain Intervention(s): Limited activity within patient's tolerance;Monitored during session;Repositioned           PT Goals (current goals can now be found in the care plan section) Acute Rehab PT Goals Patient Stated Goal: to go home today  PT Goal Formulation: With patient Time For Goal Achievement: 10/21/17 Potential to Achieve Goals: Good Progress towards PT goals: Progressing toward goals    Frequency    Min 3X/week      PT Plan Current plan remains appropriate       AM-PAC PT "6 Clicks" Daily Activity  Outcome Measure  Difficulty turning over in bed (including adjusting bedclothes, sheets and blankets)?: Unable Difficulty moving from lying on back to sitting on the side of the bed? : Unable Difficulty sitting down on and standing up from a chair with arms (e.g., wheelchair, bedside commode, etc,.)?: Unable Help needed moving to and from a bed to chair (including a wheelchair)?: Total Help needed walking in hospital room?: Total Help needed climbing 3-5 steps with a railing? : Total 6 Click Score: 6    End of Session Equipment Utilized During Treatment: Oxygen;Gait belt Activity Tolerance: Treatment limited secondary to medical complications (Comment)(increased HR) Patient left: in chair;with call bell/phone within reach;with family/visitor present;with chair alarm set Nurse Communication: Mobility status PT Visit Diagnosis: Unsteadiness on feet (R26.81);Other abnormalities of gait and mobility (R26.89);Muscle weakness (generalized) (M62.81);Difficulty in walking, not elsewhere classified (R26.2);Pain Pain - part of body: (abdomen)     Time: 2956-2130 PT Time Calculation (min) (ACUTE ONLY): 30 min  Charges:  $Gait Training: 8-22 mins $Therapeutic Activity: 8-22 mins                     Nicklas Mcsweeney B. Beverely Risen PT, DPT Acute Rehabilitation Services Pager [phone] Office [phone]    Elon Alas  Fleet 10/11/2017, 1:37 PM

## 2017-10-11 NOTE — Progress Notes (Signed)
eLink Physician-Brief Progress Note Patient Name: Philip Thompson DOB: Jun 14, 1990 MRN: [id number]   Date of Service  10/11/2017  HPI/Events of Note  Nurse called back again about tachypnea and tachycardia, patient has increased WOB   eICU Interventions  I have advised a second time  ICU nurse to place patient on vent. Discussed with RT and placed PS mode orders     Intervention Category Evaluation Type: Other  Erin Fulling 10/11/2017, 11:33 PM

## 2017-10-11 NOTE — Consult Note (Signed)
WOC Nurse ostomy follow up Stoma type/location: RLQ ileostomy Stomal assessment/size: 1 3/8" pink and moist well budded stoma. Patient with significant weight loss. This has created some uneven planes to peristomal skin.  Peristomal assessment: see above Treatment options for stomal/peristomal skin: barrier ring and convex pouch Output liquid brown stool  Ostomy pouching: 1pc.convex with barrier ring Education provided: mother at bedside  States she is feeling good about the ostomy care process.  She would like to perform the next pouch change with assistance.  Agreed to Thursday AM>  Enrolled patient in [place] [employer] Start Discharge program: no WOC team will follow.  Maple Hudson MSN, RN, FNP-BC CWON Wound, Ostomy, Continence Nurse Pager [phone]

## 2017-10-11 NOTE — Progress Notes (Signed)
!  200 neb & CPT held, pt had resting HR ~ 150 after session with PT.

## 2017-10-11 NOTE — Progress Notes (Signed)
PHARMACY - ADULT TOTAL PARENTERAL NUTRITION CONSULT NOTE   Pharmacy Consult for TPN Indication: massive bowel resection  Patient Measurements: Height: 5' 7.99" (172.7 cm) Weight: 156 lb 15.5 oz (71.2 kg) IBW/kg (Calculated) : 68.38  TPN AdjBW (KG): 77.8 Body mass index is 23.87 kg/m. Usual Weight: 87 kg  Assessment:  [name] admitted on 9/4 with complete colonic inertia, megacolon and severe obstructive constipation. Pt indicates poor intake and weight loss PTA over 1 month time frame. 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 Was found to have a massively distended colon from the cecum all the way to distal rectum and pelvic rim.  No evidence of perforation. Pharmacy has been consulted to start TPN for severe malnutrition related to chronic illness (cardiomyopathy, substance abuse, chronic constipation/obstruction with megacolon) associated with severe muscle and fat depletion and edema.   GI: Ogilvie's syndrome - s/p colectomy and ileostomy 9/10. NPO (since admission) with poor oral intake PTA. TPN initiated 9/12. Pre-albumin improved at 19.3. JP drain now out.  +ostomy with LBM noted 9/23 (107m large ileostomy output. CT abd neg 9/14 for new acute process. Post-pyloric feeding tube placed 9/23 - resumed Vital AF 1.2 at 20 mL/hr. Surgery plans to try to d/c NGT soon (to suction currently) and attempt oral (did not tolerate initial enteral feeding trial).  - Erythromycin 200/tube q 6 hrs, Prostat BID Endo: hypothyroidism (TSH 3.1 on 9/23) - continue levothyroxine. CBGs 106-122 Insulin requirements in the past 24 hours: 0 units SSI Lytes: K 4.3 (improved with increase in TPN, addition of TF, and repletion). Phos and Mg wnl. CoCa 10.3-watch.   Renal: SCr and BUN stable, UOP 0.8; I/O +1.3L (Net -17L) Pulm: procedural intubation 9/10 - extubated 9/12; re-intubated 9/13. Trached on 9/18. Cards: bilateral PE noted on admission likely provoked by lower  extremity DVT in the setting of pelvic vein compression - was on heparin drip (held for bloody JP drainage 9/14- s/p IVC filter 9/15 d/t inability to resume heparin). CT did not show evidence of RV strain. LVEF 55-60%. ST in 130-140s. Lovenox started. Hepatobil: ALT elevated but trending down, Tbili wnl, TG wnl Neuro: history of MDD, substance abuse disorder, ADD - Medications PTA: buspirone, citalopram, divalproex, gabapentin (agitation), quetiapine, and trazodone all resumed. Pt also on scheduled Klonopin and oxycodone. Experiencing some ICU delirium and abusive to staff- Precedex off. ID: s/p zosyn for possible aspiration; cefazolin added 9/22 (plan for 7 days), WBC 13, Tmax 100.8 9/18 BAL -20K Ecoli, 30K St.aureus  TPN Access: PICC triple lumen placed 9/11 TPN start date: 9/12 Nutritional Goals (per RD recommendation on 9/23): KCal: 2200-2400 Protein: 138-152 g Fluid: > 2 L  Current Nutrition: Vital AF at 20 mL/hr  (confirmed running and tolerating) + TPN  Plan:  Change TPN to be cyclic 17673mL over 14 hrs - Initiate at 69 mL/hr x 1 hour then increase to 138 mL/hr x12 hours, then reduce to 69 mL/hr x1 hour and stop.  This is a reduction in TPN with the addition of tube feeds to maintain goals without over feeding.  - TPN provides 112 gm protein and 1724 kcal (with Vital AF 1.2 at 20 mL/hr provides total of 148g protein and 2300 kcals meeting 100% of estimated kcal needs).  Electrolytes in TPN: continue current K+ MVI and trace elements in TPN Vit C 500 mg d/t concerns for severe deficiency Pepcid 40 IV in TPN Continue sensitive SSI q6h for now and monitor  Monitor TPN labs  Follow-up if Vit C still needed with CCM team   Sloan Leiter, PharmD, BCPS, BCCCP Clinical Pharmacist Clinical phone 10/11/2017 until 3:30PM - #[phone] Please refer to [facility] for [place] numbers 10/11/2017 8:13 AM

## 2017-10-11 NOTE — Progress Notes (Signed)
14 Days Post-Op   Subjective/Chief Complaint: Sitting up in bed, on HTC, reports he worked with PT yesterday   Objective: Vital signs in last 24 hours: Temp:  [98.9 F (37.2 C)-100.8 F (38.2 C)] 99.3 F (37.4 C) (09/24 0421) Pulse Rate:  [61-170] 131 (09/24 0600) Resp:  [19-39] 25 (09/24 0700) BP: (113-156)/(66-97) 122/69 (09/24 0700) SpO2:  [89 %-100 %] 97 % (09/24 0600) FiO2 (%):  [35 %-40 %] 35 % (09/24 0318) Weight:  [71.2 kg] 71.2 kg (09/24 0500) Last BM Date: 10/10/17  Intake/Output from previous day: 09/23 0701 - 09/24 0700 In: 6487.2 [I.V.:4410.5; NG/GT:563; IV Piggyback:1513.8] Out: 5200 [Urine:1400; Emesis/NG output:2750; Stool:1050] Intake/Output this shift: No intake/output data recorded.  General appearance: cooperative Resp: clear to auscultation bilaterally Cardio: regular rate and rhythm  Abd: soft, incision CDI with staples out, ileostomy pink with good output  Lab Results:  Recent Labs    10/10/17 0343 10/11/17 0557  WBC 18.2* 13.0*  HGB 11.4* 11.1*  HCT 36.0* 35.8*  PLT 269 318   BMET Recent Labs    10/10/17 0343 10/11/17 0557  NA 137 136  K 3.7 4.3  CL 103 102  CO2 25 25  GLUCOSE 127* 106*  BUN 17 17  CREATININE 0.50* 0.46*  CALCIUM 8.8* 8.9   PT/INR No results for input(s): LABPROT, INR in the last 72 hours. ABG Recent Labs    10/09/17 0010  PHART 7.433  HCO3 25.2    Studies/Results: Dg Abd 1 View  Result Date: 10/10/2017 CLINICAL DATA:  27 year old male requiring feeding tube for nutrition. Fluoroscopic advancement of intragastric feeding tube requested. EXAM: ABDOMEN - 1 VIEW COMPARISON:  10/08/2017. FLUOROSCOPY TIME:  2 minutes 24 seconds FINDINGS: A single fluoroscopic image demonstrates feeding tube tracking from the stomach into the proximal duodenum. A small volume of contrast injected opacifies the proximal duodenum. Superimposed NG type enteric tube in the stomach. IVC filter in place. Abdominal skin staples  redemonstrated. IMPRESSION: Fluoroscopic advancement of feeding tube tip to the proximal duodenum. Electronically Signed   By: Odessa Fleming M.D.   On: 10/10/2017 11:08   Dg Chest Port 1 View  Result Date: 10/10/2017 CLINICAL DATA:  Respiratory failure and shortness of breath. EXAM: PORTABLE CHEST 1 VIEW COMPARISON:  10/16/2016. FINDINGS: Nasogastric and feeding tubes extend beyond the inferior aspect of the film. right-sided PICC line terminates at low SVC. Tracheostomy appropriately position. Normal heart size. Possible small right pleural effusion. No pneumothorax. Clear left lung. Interval development of collapse/consolidative change in the right lower lobe with volume loss. IMPRESSION: Right lower lobe collapse/consolidative change. Given volume loss, at least partially felt to represent atelectasis. Infection or aspiration cannot be excluded. Electronically Signed   By: Jeronimo Greaves M.D.   On: 10/10/2017 07:38    Anti-infectives: Anti-infectives (From admission, onward)   Start     Dose/Rate Route Frequency Ordered Stop   10/09/17 1730  ceFAZolin (ANCEF) IVPB 2g/100 mL premix     2 g 200 mL/hr over 30 Minutes Intravenous Every 8 hours 10/09/17 1641 10/16/17 2359   10/08/17 0815  erythromycin ethylsuccinate (EES) 200 MG/5ML suspension 200 mg  Status:  Discontinued     200 mg Per Tube Every 6 hours 10/08/17 0807 10/09/17 1636   09/27/17 1000  piperacillin-tazobactam (ZOSYN) IVPB 3.375 g  Status:  Discontinued     3.375 g 12.5 mL/hr over 240 Minutes Intravenous Every 8 hours 09/27/17 0845 10/06/17 1504      Assessment/Plan: S/P subtotal colectomy with ileostomy  9/10 Dr. Magnus Ivan - Gastric atony, continue NG to suction and post pyloric TF. Hopefully with time can D/C NGT and try PO PE - Lovenox Acute resp failure - on HTC, per CCM PT/OT - rec CIR  I spoke with his mother at the bedside.  LOS: 19 days    Liz Malady 10/11/2017

## 2017-10-11 NOTE — Progress Notes (Signed)
Nutrition Follow-up  DOCUMENTATION CODES:   Severe malnutrition in context of chronic illness  INTERVENTION:   Tube Feeding: Goal : Vital AF 1.2 @ 80 ml/hr Increase to 30 ml/hr, if tolerating, increase by 10 mL q 12 hours until goal rate of 80 ml/hr Provides 2304 kcals, 144 g of protein and 1555 mL of free water  Continue TPN per Pharmacy  Goal to meet 100% of needs via TPN/TF   NUTRITION DIAGNOSIS:   Severe Malnutrition related to chronic illness(cardiomyopathy, substance abuse, chronic constipation/obstipation with megacolon) as evidenced by severe fat depletion, severe muscle depletion.  Being addressed via nutrition support  GOAL:   Patient will meet greater than or equal to 90% of their needs  Met  MONITOR:   TF tolerance, I & O's, Labs, Skin, Vent status, Diet advancement  REASON FOR ASSESSMENT:   Ventilator    ASSESSMENT:   27 yo male admitted 9/4 for megacolon and severe obstructive constipation with severely dilated colon and colitis. Noted pt admitted to [facility] at the end of August with constipation x 3 weeks with megacolon with medical therapy attempts, discharged on 9/2. Pt also with chest pain with bilateral PE on [place] includes polysubstance abuse, drug-induced cardiomyopathy, anxiety, chronic obstipation/constipation  9/10 Total colectomy with ileostomy, Vent 9/11 PICC line inserted 9/12 TPN initiated, Extubated 9/13 Re-Intubated 9/16 Trickle feedings started 9/18 Trach placed, Cortrak  9/21 TPN re-started, NG tube for decompression, TF held 9/23 Diagnostic Radiology advanced Cortrak tube to post-pyloric position  Pt being assessed for Inpatient Rehab Working with SLP re: PMV Tolerating Vital AF 1.2 @ 20 ml/hr via post-pyloric Cortrak tube Cyclic TPN continues; modified today to provide 1724 kcals, 112 g of protein  NG to suction 2.8 L overnight Ileostomy with 1 L stool output  Labs: reviewed Meds; D5-1/2 NS at 75 ml/hr   Diet  Order:   Diet Order    None      EDUCATION NEEDS:   Not appropriate for education at this time  Skin:  Skin Assessment: Skin Integrity Issues: Skin Integrity Issues:: Stage III Stage III: intragluteal (coccygeal) Incisions: abdomen  Last BM:  stool via ileostomy   Height:   Ht Readings from Last 1 Encounters:  09/27/17 5' 7.99" (1.727 m)    Weight:   Wt Readings from Last 1 Encounters:  10/11/17 71.2 kg    Ideal Body Weight:  70 kg  BMI:  Body mass index is 23.87 kg/m.  Estimated Nutritional Needs:   Kcal:  2200-2400 kcals   Protein:  138-152 g  Fluid:  >/= 2 L   Kerman Passey MS, RD, LDN, CNSC [phone] Pager  [phone] Weekend/On-Call Pager

## 2017-10-11 NOTE — Consult Note (Signed)
Physical Medicine and Rehabilitation Consult   Reason for Consult: Debility Referring Physician: Dr. Molli Knock.    HPI: Philip Thompson is a 27 y.o. 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  History taken from chart review and mother. He was transferred to [facility] for treatment and CT scan done revealing bilateral PE and massive distention of the colon.  Dr. Evette Cristal consulted for input and recommended neostigmine trial with monitoring.  He was started on IV heparin for PE and has no improvement in colonic inertia, was taken to the OR for subtotal colectomy and ileostomy on zero 9/10 by Dr. Magnus Ivan.  He required intubation and sedation as well as NG tube due to high volume output.  IVC filter placed TPN started for nutritional support he has had issues with anxiety as well as agitation and difficulty with vent wean requiring tracheostomy on 9/18 by Dr. Molli Knock.  Anxiety levels improving and he has been weaned to ATC. He has been unable to tolerate tube feed trials and post pyloric panda placed by radiology yesterday. Therapy evaluations done and patient noted to be limited by tachycardia and debility.  CIR recommended due to functional deficits  Review of Systems  Unable to perform ROS: Patient nonverbal   Past Medical History:  Diagnosis Date  . ADD (attention deficit disorder)   . Aggression   . Allergic rhinitis   . Anxiety   . Arthritis    joint pain  . Cardiomyopathy (HCC)    a. EF <25% in 2015 during admission for multi substance intoxication/respiratory failure requiring intubation.  . Chronic back pain   . Constipation   . Depression   . Fecal obstruction (HCC)   . Frontal head injury   . Heroin overdose (HCC)   . Obstipation   . Polysubstance abuse (HCC)   . Suicide attempt [facility])      Past Surgical History:  Procedure Laterality Date  . COLECTOMY N/A 09/27/2017   Procedure: SUBTOTAL COLECTOMY AND ILEOSTOMY;  Surgeon: Abigail Miyamoto, MD;  Location: [facility] OR;  Service: General;  Laterality: N/A;  . FOOT SURGERY     to get a BB out  . IR IVC FILTER PLMT / S&I /IMG GUID/MOD SED  10/02/2017  . NO PAST SURGERIES      Family History  Problem Relation Age of Onset  . Dementia Father   . Alcohol abuse Father   . Diabetes Brother   . Cancer - Ovarian Maternal Grandmother   . Cancer - Lung Paternal Grandmother   . Cancer Paternal Grandfather   . Diabetes Other   . Hypertension Other   . Heart disease Other   . Heart attack Other     Social History:  Lives with disabled father. Parents divorced by mother checks in on them daily. Mother works nights on weekends at [employer]. Per reports that he has been smoking cigarettes--one PPD.  He has a 7.00 pack-year smoking history. He has never used smokeless tobacco. He denies any alcohol use. He reports that he has current drug history. Drugs: Marijuana and Cocaine--"for pain" per mother.    Allergies: No Known Allergies    Medications Prior to Admission  Medication Sig Dispense Refill  . busPIRone (BUSPAR) 15 MG tablet Take 1 tablet (15 mg total) by mouth 3 (three) times daily. 90 tablet 3  .  citalopram (CELEXA) 40 MG tablet Take 1 tablet (40 mg total) by mouth daily. 90 tablet 0  . cyclobenzaprine (FLEXERIL) 10 MG tablet Take 1 tablet (10 mg total) by mouth 3 (three) times daily as needed for muscle spasms. 30 tablet 0  . divalproex (DEPAKOTE) 500 MG DR tablet Take 1 tablet (500 mg total) by mouth every 12 (twelve) hours. For mood stabilization 180 tablet 0  . docusate sodium (COLACE) 100 MG capsule Take 1 capsule (100 mg total) by mouth 2 (two) times daily. (Patient taking differently: Take 100 mg by mouth daily. ) 60 capsule 0  . furosemide (LASIX) 20 MG tablet Take 1 tablet (20 mg total) by mouth daily. Prn leg  swelling (Patient taking differently: Take 20 mg by mouth daily as needed for fluid or edema. As needed for leg swelling) 30 tablet 2  . gabapentin (NEURONTIN) 300 MG capsule Take 2 capsules (600 mg total) by mouth 3 (three) times daily. For agitation 540 capsule 0  . ibuprofen (ADVIL,MOTRIN) 600 MG tablet Take 1 tablet (600 mg total) by mouth every 6 (six) hours as needed for moderate pain. (May buy from over the counter): For pain 1 tablet 0  . levothyroxine (SYNTHROID) 50 MCG tablet Take 1 tablet (50 mcg total) by mouth daily before breakfast. 30 tablet 1  . linaclotide (LINZESS) 290 MCG CAPS capsule Take 1 capsule (290 mcg total) by mouth daily before breakfast. 30 capsule 1  . potassium chloride (K-DUR,KLOR-CON) 10 MEQ tablet Take 1 tablet (10 mEq total) by mouth daily. With furosemide (Patient taking differently: Take 10 mEq by mouth daily as needed (only takes when Lasix (Furosemide)). With furosemide) 30 tablet 2  . QUEtiapine (SEROQUEL) 400 MG tablet Take 2 tablets (800 mg total) by mouth at bedtime. 180 tablet 0  . traMADol (ULTRAM) 50 MG tablet Take 50 mg by mouth every 12 (twelve) hours as needed for moderate pain or severe pain.   0  . traZODone (DESYREL) 100 MG tablet Take 1 tablet (100 mg total) by mouth at bedtime. 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, industrial/product Status:  Mobility: Bed Mobility Overal bed mobility: Needs  Assistance Bed Mobility: Supine to Sit Rolling: Max assist, +2 for physical assistance Supine to sit: Max assist General bed mobility comments: pt able to initiate LE movement toward EoB, requires maxA for trunk to upright and hip scoot toward EoB Transfers Overall transfer level: Needs assistance Equipment used: 2 person hand held assist Transfers: Sit to/from Stand Sit to Stand: Max assist, +2 physical assistance General transfer comment: maxAx2 for power up and steadying in standing Ambulation/Gait Ambulation/Gait assistance: Max assist, +2 physical assistance Gait Distance (Feet): 2 Feet Assistive device: 2 person hand held assist Gait Pattern/deviations: Step-to pattern, Shuffle General Gait Details: maxAx2 for stepping to recliner, vc for sequencing, increased time required Gait velocity: slowed Gait velocity interpretation: <1.31 ft/sec, indicative of household ambulator    ADL: ADL Overall ADL's : Needs assistance/impaired Eating/Feeding: NPO Grooming: Wash/dry hands, Wash/dry face, Set up, Bed level Upper Body Bathing: Set  up, Bed level Lower Body Bathing: Maximal assistance, Bed level Upper Body Dressing : Maximal assistance, Bed level Lower Body Dressing: Maximal assistance, Bed level Toilet Transfer: Total assistance Toilet Transfer Details (indicate cue type and reason): unable to safely attempt  Toileting- Clothing Manipulation and Hygiene: Total assistance, Bed level General ADL Comments: Pt limited by lines and tubes as well as HR 152-166   Cognition: Cognition Overall Cognitive Status: Impaired/Different from baseline Orientation Level: Intubated/Tracheostomy - Unable to assess Cognition Arousal/Alertness: Awake/alert Behavior During Therapy: Flat affect, WFL for tasks assessed/performed Overall Cognitive Status: Impaired/Different from baseline Area of Impairment: Attention, Memory, Following commands, Awareness, Problem solving Current Attention Level:  Sustained Memory: Decreased short-term memory Following Commands: Follows one step commands consistently Safety/Judgement: Decreased awareness of safety, Decreased awareness of deficits Awareness: Intellectual Problem Solving: Slow processing, Difficulty sequencing, Requires tactile cues, Requires verbal cues General Comments: oriented x4, however continues to have limited awareness of his deficits   Blood pressure 111/69, pulse (!) 130, temperature 98.6 F (37 C), temperature source Oral, resp. rate (!) 32, height 5' 7.99" (1.727 m), weight 71.2 kg, SpO2 94 %. Physical Exam  Nursing note and vitals reviewed. Constitutional: He appears well-developed.  Cachectic  HENT:  Head: Normocephalic and atraumatic.  +Feeding tube +NG  Eyes: EOM are normal. Right eye exhibits no discharge. Left eye exhibits no discharge.  Neck:  +Trach  Cardiovascular: Regular rhythm.  +Tachycardia  Respiratory: Breath sounds normal.  +Tachypnea  GI:  Abdominal incision  Musculoskeletal:  No edema or tenderness in extremities  Neurological: He is alert.  Confused and distracted but able to follow basic commands with increased time Thought he was at [facility] and was here due to "helipad".  Motor: 4+/5 throughout  Skin: Skin is warm and dry.  See above  Psychiatric: His affect is blunt. He is slowed. He expresses impulsivity.    Results for orders placed or performed during the hospital encounter of 09/21/17 (from the past 24 hour(s))  TSH     Status: None   Collection Time: 10/10/17 11:56 AM  Result Value Ref Range   TSH 3.100 0.350 - 4.500 uIU/mL  Glucose, capillary     Status: Abnormal   Collection Time: 10/10/17 11:59 AM  Result Value Ref Range   Glucose-Capillary 108 (H) 70 - 99 mg/dL  Glucose, capillary     Status: Abnormal   Collection Time: 10/10/17  6:25 PM  Result Value Ref Range   Glucose-Capillary 112 (H) 70 - 99 mg/dL  Glucose, capillary     Status: Abnormal   Collection Time: 10/11/17  12:00 AM  Result Value Ref Range   Glucose-Capillary 106 (H) 70 - 99 mg/dL  Glucose, capillary     Status: Abnormal   Collection Time: 10/11/17  4:06 AM  Result Value Ref Range   Glucose-Capillary 122 (H) 70 - 99 mg/dL  CBC     Status: Abnormal   Collection Time: 10/11/17  5:57 AM  Result Value Ref Range   WBC 13.0 (H) 4.0 - 10.5 K/uL   RBC 3.94 (L) 4.22 - 5.81 MIL/uL   Hemoglobin 11.1 (L) 13.0 - 17.0 g/dL   HCT 16.1 (L) 09.6 - 04.5 %   MCV 90.9 78.0 - 100.0 fL   MCH 28.2 26.0 - 34.0 pg   MCHC 31.0 30.0 - 36.0 g/dL   RDW 40.9 81.1 - 91.4 %   Platelets 318 150 - 400 K/uL  Comprehensive metabolic panel     Status: Abnormal   Collection  Time: 10/11/17  5:57 AM  Result Value Ref Range   Sodium 136 135 - 145 mmol/L   Potassium 4.3 3.5 - 5.1 mmol/L   Chloride 102 98 - 111 mmol/L   CO2 25 22 - 32 mmol/L   Glucose, Bld 106 (H) 70 - 99 mg/dL   BUN 17 6 - 20 mg/dL   Creatinine, Ser 1.91 (L) 0.61 - 1.24 mg/dL   Calcium 8.9 8.9 - 47.8 mg/dL   Total Protein 6.3 (L) 6.5 - 8.1 g/dL   Albumin 2.3 (L) 3.5 - 5.0 g/dL   AST 20 15 - 41 U/L   ALT 50 (H) 0 - 44 U/L   Alkaline Phosphatase 116 38 - 126 U/L   Total Bilirubin 0.4 0.3 - 1.2 mg/dL   GFR calc non Af Amer >60 >60 mL/min   GFR calc Af Amer >60 >60 mL/min   Anion gap 9 5 - 15  Prealbumin     Status: None   Collection Time: 10/11/17  5:57 AM  Result Value Ref Range   Prealbumin 19.3 18 - 38 mg/dL  Magnesium     Status: None   Collection Time: 10/11/17  5:57 AM  Result Value Ref Range   Magnesium 1.8 1.7 - 2.4 mg/dL  Phosphorus     Status: None   Collection Time: 10/11/17  5:57 AM  Result Value Ref Range   Phosphorus 3.3 2.5 - 4.6 mg/dL  Differential     Status: Abnormal   Collection Time: 10/11/17  5:57 AM  Result Value Ref Range   Neutrophils Relative % 74 %   Neutro Abs 9.6 (H) 1.7 - 7.7 K/uL   Lymphocytes Relative 10 %   Lymphs Abs 1.3 0.7 - 4.0 K/uL   Monocytes Relative 14 %   Monocytes Absolute 1.9 (H) 0.1 - 1.0 K/uL    Eosinophils Relative 1 %   Eosinophils Absolute 0.1 0.0 - 0.7 K/uL   Basophils Relative 0 %   Basophils Absolute 0.1 0.0 - 0.1 K/uL   Immature Granulocytes 1 %   Abs Immature Granulocytes 0.2 (H) 0.0 - 0.1 K/uL  Triglycerides     Status: None   Collection Time: 10/11/17  5:58 AM  Result Value Ref Range   Triglycerides 45 <150 mg/dL   Dg Abd 1 View  Result Date: 10/10/2017 CLINICAL DATA:  27 year old male requiring feeding tube for nutrition. Fluoroscopic advancement of intragastric feeding tube requested. EXAM: ABDOMEN - 1 VIEW COMPARISON:  10/08/2017. FLUOROSCOPY TIME:  2 minutes 24 seconds FINDINGS: A single fluoroscopic image demonstrates feeding tube tracking from the stomach into the proximal duodenum. A small volume of contrast injected opacifies the proximal duodenum. Superimposed NG type enteric tube in the stomach. IVC filter in place. Abdominal skin staples redemonstrated. IMPRESSION: Fluoroscopic advancement of feeding tube tip to the proximal duodenum. Electronically Signed   By: Odessa Fleming M.D.   On: 10/10/2017 11:08   Dg Chest Port 1 View  Result Date: 10/10/2017 CLINICAL DATA:  Respiratory failure and shortness of breath. EXAM: PORTABLE CHEST 1 VIEW COMPARISON:  10/16/2016. FINDINGS: Nasogastric and feeding tubes extend beyond the inferior aspect of the film. right-sided PICC line terminates at low SVC. Tracheostomy appropriately position. Normal heart size. Possible small right pleural effusion. No pneumothorax. Clear left lung. Interval development of collapse/consolidative change in the right lower lobe with volume loss. IMPRESSION: Right lower lobe collapse/consolidative change. Given volume loss, at least partially felt to represent atelectasis. Infection or aspiration cannot be excluded. Electronically Signed  By: Jeronimo Greaves M.D.   On: 10/10/2017 07:38    Assessment/Plan: Diagnosis: Debility Labs independently reviewed.  Records reviewed and summated above.  1. Does  the need for close, 24 hr/day medical supervision in concert with the patient's rehab needs make it unreasonable for this patient to be served in a less intensive setting? Yes 2. Co-Morbidities requiring supervision/potential complications: MDD (cont meds), polysubstance abuse (counsel when appropriate), multiple TBI's, anoxic BI, chronic pain (Biofeedback training with therapies to help reduce reliance on opiate pain medications, monitor pain control during therapies, and sedation at rest and titrate to maximum efficacy to ensure participation and gains in therapies), chronic constipation, Tachycardia (monitor in accordance with pain and increasing activity), tachypnea (monitor RR and O2 Sats with increased physical exertion), leukocytosis (cont to monitor for signs and symptoms of infection, further workup if indicated), bilateral PE (cont meds) 3. Due to bladder management, bowel management, safety, skin/wound care, disease management, medication administration, pain management and patient education, does the patient require 24 hr/day rehab nursing? Yes 4. Does the patient require coordinated care of a physician, rehab nurse, PT (1-2 hrs/day, 5 days/week), OT (1-2 hrs/day, 5 days/week) and SLP (1-2 hrs/day, 5 days/week) to address physical and functional deficits in the context of the above medical diagnosis(es)? Yes Addressing deficits in the following areas: balance, endurance, locomotion, strength, transferring, bowel/bladder control, bathing, dressing, feeding, grooming, toileting, cognition, speech, swallowing and psychosocial support 5. Can the patient actively participate in an intensive therapy program of at least 3 hrs of therapy per day at least 5 days per week? Potentially 6. The potential for patient to make measurable gains while on inpatient rehab is excellent 7. Anticipated functional outcomes upon discharge from inpatient rehab are min assist  with PT, min assist with OT, min assist with  SLP. 8. Estimated rehab length of stay to reach the above functional goals is: 20-25 days. 9. Anticipated D/C setting: TBD 10. Anticipated post D/C treatments: HH therapy and Home excercise program 11. Overall Rehab/Functional Prognosis: good and fair  RECOMMENDATIONS: This patient's condition is appropriate for continued rehabilitative care in the following setting: CIR once medically stable and able to tolerate 3 hours of therapy/day if 24/7 caregiver support and supervision available. Patient has agreed to participate in recommended program. Potentially Note that insurance prior authorization may be required for reimbursement for recommended care.  Comment: Rehab Admissions Coordinator to follow up.   I have personally performed a face to face diagnostic evaluation, including, but not limited to relevant history and physical exam findings, of this patient and developed relevant assessment and plan.  Additionally, I have reviewed and concur with the physician assistant's documentation above.   Maryla Morrow, MD, ABPMR Jacquelynn Cree, PA-C 10/11/2017

## 2017-10-11 NOTE — Progress Notes (Signed)
Patient has been tachycardic and tachypnic.Philip Kitchen RN notified ELink MD which then advised to place patient back on vent. RT is currently in the room placing patient back on the vent per the request of Elink MD Dr. Belia Heman. Will continue to monitor patient HR and RR.

## 2017-10-11 NOTE — Progress Notes (Signed)
eLink Physician-Brief Progress Note Patient Name: Philip Thompson DOB: 1990/09/10 MRN: [id number]   Date of Service  10/11/2017  HPI/Events of Note  Patient is tachypniec and tachycardic  eICU Interventions  advised to place back on vent     Intervention Category Evaluation Type: Other  Erin Fulling 10/11/2017, 10:58 PM

## 2017-10-12 DIAGNOSIS — F329 Major depressive disorder, single episode, unspecified: Secondary | ICD-10-CM

## 2017-10-12 DIAGNOSIS — F1290 Cannabis use, unspecified, uncomplicated: Secondary | ICD-10-CM

## 2017-10-12 DIAGNOSIS — G4700 Insomnia, unspecified: Secondary | ICD-10-CM

## 2017-10-12 DIAGNOSIS — F32A Depression, unspecified: Secondary | ICD-10-CM

## 2017-10-12 DIAGNOSIS — F419 Anxiety disorder, unspecified: Secondary | ICD-10-CM

## 2017-10-12 DIAGNOSIS — F1490 Cocaine use, unspecified, uncomplicated: Secondary | ICD-10-CM

## 2017-10-12 DIAGNOSIS — F17210 Nicotine dependence, cigarettes, uncomplicated: Secondary | ICD-10-CM

## 2017-10-12 LAB — GLUCOSE, CAPILLARY
GLUCOSE-CAPILLARY: 118 mg/dL — AB (ref 70–99)
GLUCOSE-CAPILLARY: 116 mg/dL — AB (ref 70–99)
GLUCOSE-CAPILLARY: 110 mg/dL — AB (ref 70–99)
GLUCOSE-CAPILLARY: 94 mg/dL (ref 70–99)
GLUCOSE-CAPILLARY: 107 mg/dL — AB (ref 70–99)
Glucose-Capillary: 120 mg/dL — ABNORMAL HIGH (ref 70–99)

## 2017-10-12 LAB — BASIC METABOLIC PANEL
Anion gap: 11 (ref 5–15)
BUN: 18 mg/dL (ref 6–20)
CALCIUM: 8.8 mg/dL — AB (ref 8.9–10.3)
CO2: 26 mmol/L (ref 22–32)
Chloride: 99 mmol/L (ref 98–111)
Creatinine, Ser: 0.46 mg/dL — ABNORMAL LOW (ref 0.61–1.24)
GFR calc Af Amer: >60 mL/min (ref >60)
Glucose, Bld: 109 mg/dL — ABNORMAL HIGH (ref 70–99)
Potassium: 4.5 mmol/L (ref 3.5–5.1)
SODIUM: 136 mmol/L (ref 135–145)

## 2017-10-12 LAB — CBC
HCT: 36 % — ABNORMAL LOW (ref 39.0–52.0)
Hemoglobin: 11.2 g/dL — ABNORMAL LOW (ref 13.0–17.0)
MCH: 28 pg (ref 26.0–34.0)
MCHC: 31.1 g/dL (ref 30.0–36.0)
MCV: 90 fL (ref 78.0–100.0)
PLATELETS: 305 10*3/uL (ref 150–400)
RBC: 4 MIL/uL — AB (ref 4.22–5.81)
RDW: 14.1 % (ref 11.5–15.5)
WBC: 10.9 10*3/uL — AB (ref 4.0–10.5)

## 2017-10-12 LAB — MAGNESIUM: MAGNESIUM: 1.9 mg/dL (ref 1.7–2.4)

## 2017-10-12 LAB — PHOSPHORUS: Phosphorus: 3.5 mg/dL (ref 2.5–4.6)

## 2017-10-12 MED ORDER — BUSPIRONE HCL 5 MG PO TABS
10.0000 mg | ORAL_TABLET | Freq: Two times a day (BID) | ORAL | Status: DC
  Administered 2017-10-13 – 2017-10-21 (×13): 10 mg
  Filled 2017-10-12 (×13): qty 2

## 2017-10-12 MED ORDER — VITAL 1.5 CAL PO LIQD
1000.0000 mL | ORAL | Status: DC
  Filled 2017-10-12 (×2): qty 1000

## 2017-10-12 MED ORDER — FENTANYL CITRATE (PF) 100 MCG/2ML IJ SOLN
25.0000 ug | INTRAMUSCULAR | Status: DC | PRN
  Administered 2017-10-12: 25 ug via INTRAVENOUS
  Administered 2017-10-12 – 2017-10-19 (×29): 50 ug via INTRAVENOUS
  Filled 2017-10-12 (×29): qty 2

## 2017-10-12 MED ORDER — METOCLOPRAMIDE HCL 5 MG/ML IJ SOLN
10.0000 mg | Freq: Four times a day (QID) | INTRAMUSCULAR | Status: DC
  Administered 2017-10-12 – 2017-10-21 (×37): 10 mg via INTRAVENOUS
  Filled 2017-10-12 (×38): qty 2

## 2017-10-12 MED ORDER — TRAVASOL 10 % IV SOLN
INTRAVENOUS | Status: AC
  Administered 2017-10-12: 17:00:00 via INTRAVENOUS
  Filled 2017-10-12: qty 620

## 2017-10-12 MED ORDER — BUSPIRONE HCL 5 MG PO TABS
5.0000 mg | ORAL_TABLET | Freq: Two times a day (BID) | ORAL | Status: AC | PRN
  Administered 2017-10-14: 5 mg
  Filled 2017-10-12: qty 1

## 2017-10-12 MED ORDER — FENTANYL CITRATE (PF) 100 MCG/2ML IJ SOLN
INTRAMUSCULAR | Status: AC
  Filled 2017-10-12: qty 2

## 2017-10-12 MED ORDER — MIDAZOLAM HCL 2 MG/2ML IJ SOLN: 1.0000 mg | INTRAMUSCULAR | Status: DC | PRN

## 2017-10-12 MED ORDER — MIDAZOLAM HCL 2 MG/2ML IJ SOLN
INTRAMUSCULAR | Status: AC
  Filled 2017-10-12: qty 2

## 2017-10-12 MED ORDER — QUETIAPINE FUMARATE 25 MG PO TABS
200.0000 mg | ORAL_TABLET | Freq: Every evening | ORAL | Status: DC | PRN
  Administered 2017-10-14 – 2017-10-16 (×2): 200 mg via ORAL
  Filled 2017-10-12 (×2): qty 1

## 2017-10-12 MED ORDER — VITAL AF 1.2 CAL PO LIQD
1000.0000 mL | ORAL | Status: DC
  Administered 2017-10-12 – 2017-10-15 (×4): 1000 mL
  Filled 2017-10-12: qty 1000

## 2017-10-12 NOTE — Progress Notes (Addendum)
PHARMACY - ADULT TOTAL PARENTERAL NUTRITION CONSULT NOTE   Pharmacy Consult for TPN Indication: massive bowel resection  Patient Measurements: Height: 5' 7.99" (172.7 cm) Weight: 148 lb 5.9 oz (67.3 kg) IBW/kg (Calculated) : 68.38  TPN AdjBW (KG): 77.8 Body mass index is 22.56 kg/m. Usual Weight: 87 kg  Assessment:  [name] admitted on 9/4 with complete colonic inertia, megacolon and severe obstructive constipation. Pt indicates poor intake and weight loss PTA over 1 month time frame. 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 Was found to have a massively distended colon from the cecum all the way to distal rectum and pelvic rim.  No evidence of perforation. Pharmacy has been consulted to start TPN for severe malnutrition related to chronic illness (cardiomyopathy, substance abuse, chronic constipation/obstruction with megacolon) associated with severe muscle and fat depletion and edema.   GI: Ogilvie's syndrome - s/p colectomy and ileostomy 9/10. NPO (since admission) with poor oral intake PTA. TPN initiated 9/12. Pre-albumin improved at 19.3. JP drain now out. LBM noted 9/25 (400 ml large ileostomy output/24h). CT abd neg 9/14 for new acute process. Post-pyloric feeding tube placed 9/23 - resumed Vital AF 1.2 on 9/23 - now at 35m/hr (goal 80 ml/hr) and pt appears to be tolerating but continues with good amount of NG o/p ~20060m- adding reglan.   Endo: hypothyroidism (TSH 3.1 on 9/23) - continue levothyroxine. CBGs 99-118 Insulin requirements in the past 24 hours: 0 units SSI Lytes: K 4.5 (improved with increase in TPN, addition of TF, and repletion). Phos and Mg wnl. CoCa 10.2  Renal: SCr and BUN stable, UOP ~1050/24h; I/O +1L (Net -15L) Pulm: procedural intubation 9/10 - extubated 9/12; re-intubated 9/13. Trached on 9/18. Had to be re-intubated briefly 9/24 pm Cards: bilateral PE noted on admission likely provoked by lower extremity DVT in the setting  of pelvic vein compression - was on heparin drip (held for bloody JP drainage 9/14- s/p IVC filter 9/15 d/t inability to resume heparin). CT did not show evidence of RV strain. LVEF 55-60%. ST in 130-140s. Lovenox started 9/21. Hepatobil: ALT elevated but trending down, Tbili wnl, TG wnl Neuro: history of MDD, substance abuse disorder, ADD - Medications PTA: buspirone, citalopram, divalproex, gabapentin (agitation), quetiapine, and trazodone all resumed. Pt also on scheduled Klonopin and oxycodone. Experiencing some ICU delirium and abusive to staff. Precedex off. ID: s/p [email or web address] for possible aspiration; cefazolin added 9/22 (plan for 7 days), WBC down to 10.9, Afebrile 9/18 BAL -20K Ecoli, 30K St.aureus  TPN Access: PICC triple lumen placed 9/11 TPN start date: 9/12 Nutritional Goals (per RD recommendation on 9/23): KCal: 2200-2400 Protein: 138-152 g Fluid: > 2 L  Current Nutrition: Vital AF at 50 mL/hr  (confirmed running and tolerating but with good amount of NGT o/p) + TPN  Plan:  - Decrease cyclic TPN to 101660L over 12 hrs - Initiate at 45 mL/hr x 1 hour then increase to 91 mL/hr x12 hours, then reduce to 45 mL/hr x1 hour and stop.  - TPN provides 62 gm protein and 958 kcal. Vital AF 1.2 @ 5018mr provides 1440 kcal and 90 gm protein. (TPN + TF provide ~2400 kcal and 152 gm protein meeting 100% of estimated needs). -Will f/u tolerance of TF and NGT o/p and ability to d/c TPN -Electrolytes in TPN: continue current K+ -MVI and trace elements in TPN -Vit C 500 mg d/t concerns for severe deficiency -Pepcid 40 IV in TPN -Continue sensitive  SSI q6h for now and monitor   -Monitor TPN labs   Sherlon Handing, PharmD, BCPS Clinical pharmacist  **Pharmacist phone directory can now be found on [email or web address] ([email or web address]).  Listed under Alex. 10/12/2017 7:54 AM

## 2017-10-12 NOTE — Progress Notes (Signed)
Occupational Therapy Treatment Patient Details Name: Philip Thompson MRN: [id number] DOB: 1990/05/06 Today's Date: 10/12/2017    History of present illness 27 y/o male admitted with colonic inertia/megacolon secondary to constipation and bilateral pulmonary emboli and DVT due to pelvic vein obstruction.  He had acute aspiration pneumonia on 9/10 prior to surgery in the setting of nausea/vomiting.  He went to the OR on 9/10 for total colectomy and ileostomy.  Extubated 9/12, Reintubated 9/13. 9/18 Trached and Cortrak placed PMH Anxiety/depression, hx of substance abuse disorder   OT comments  Pt progressing well. Demonstrated ability to don front opening gown with min assist and socks with set up. Ambulated to outdoor balcony with min to moderate assistance and RN assisting with lines. HR to 140 non sustained.Continues to demonstrate impaired cognition, requiring increased processing time to follow commands and negotiate obstacles with RW.  Follow Up Recommendations  CIR;Supervision/Assistance - 24 hour    Equipment Recommendations  None recommended by OT    Recommendations for Other Services      Precautions / Restrictions Precautions Precautions: Fall Precaution Comments: watch HR Restrictions Weight Bearing Restrictions: No       Mobility Bed Mobility               General bed mobility comments: pt received in chair  Transfers Overall transfer level: Needs assistance Equipment used: Rolling walker (2 wheeled) Transfers: Sit to/from Stand Sit to Stand: Min assist;+2 safety/equipment         General transfer comment: verbal cues for hand placement, performed x 2 from chair    Balance Overall balance assessment: Needs assistance   Sitting balance-Leahy Scale: Fair       Standing balance-Leahy Scale: Poor Standing balance comment: requires B UE support and min assist                           ADL either performed or assessed with clinical  judgement   ADL                   Upper Body Dressing : Minimal assistance;Sitting   Lower Body Dressing: Set up;Sitting/lateral leans(socks)                       Vision       Perception     Praxis      Cognition Arousal/Alertness: Awake/alert Behavior During Therapy: Flat affect Overall Cognitive Status: Impaired/Different from baseline Area of Impairment: Following commands;Problem solving                       Following Commands: Follows one step commands with increased time Safety/Judgement: Decreased awareness of safety;Decreased awareness of deficits   Problem Solving: Slow processing;Difficulty sequencing;Requires tactile cues;Requires verbal cues          Exercises     Shoulder Instructions       General Comments      Pertinent Vitals/ Pain       Pain Assessment: No/denies pain  Home Living Family/patient expects to be discharged to:: Private residence Living Arrangements: Parent Available Help at Discharge: Family;Available PRN/intermittently Type of Home: House Home Access: Stairs to enter [employer] of Steps: 2 Entrance Stairs-Rails: None Home Layout: One level     Bathroom Shower/Tub: Tub/shower unit                    Prior Functioning/Environment  Frequency  Min 2X/week        Progress Toward Goals  OT Goals(current goals can now be found in the care plan section)  Progress towards OT goals: Progressing toward goals  Acute Rehab OT Goals Patient Stated Goal: to go outside OT Goal Formulation: With patient Time For Goal Achievement: 10/22/17 Potential to Achieve Goals: Good  Plan Discharge plan remains appropriate    Co-evaluation                 AM-PAC PT "6 Clicks" Daily Activity     Outcome Measure   Help from another person eating meals?: Total Help from another person taking care of personal grooming?: A Little Help from another person toileting,  which includes using toliet, bedpan, or urinal?: Total Help from another person bathing (including washing, rinsing, drying)?: A Lot Help from another person to put on and taking off regular upper body clothing?: A Little Help from another person to put on and taking off regular lower body clothing?: A Little 6 Click Score: 13    End of Session Equipment Utilized During Treatment: Oxygen(8L)  OT Visit Diagnosis: Cognitive communication deficit (R41.841);Other abnormalities of gait and mobility (R26.89);Unsteadiness on feet (R26.81);Muscle weakness (generalized) (M62.81)   Activity Tolerance Patient tolerated treatment well   Patient Left in chair;with call bell/phone within reach;with family/visitor present   Nurse Communication Mobility status        Time: 1610-9604 OT Time Calculation (min): 35 min  Charges: OT General Charges $OT Visit: 1 Visit OT Treatments $Self Care/Home Management : 8-22 mins $Therapeutic Activity: 8-22 mins  Martie Round, OTR/L Acute Rehabilitation Services Pager: [phone] Office: [phone]   Evern Bio 10/12/2017, 1:09 PM

## 2017-10-12 NOTE — Progress Notes (Signed)
PHARMACY ANTICOAGULATION CONSULT NOTE   Pharmacy Consult for Lovenox Indication: pulmonary embolus  No Known Allergies  Patient Measurements: Height: 5' 7.99" (172.7 cm) Weight: 148 lb 5.9 oz (67.3 kg) IBW/kg (Calculated) : 68.38   Vital Signs: Temp: 98.7 F (37.1 C) (09/25 0713) Temp Source: Oral (09/25 0713) BP: 136/85 (09/25 0600) Pulse Rate: 124 (09/25 0600)  Labs: Recent Labs    10/10/17 0343 10/11/17 0557 10/12/17 0500  HGB 11.4* 11.1* 11.2*  HCT 36.0* 35.8* 36.0*  PLT 269 318 305  CREATININE 0.50* 0.46* 0.46*    Estimated Creatinine Clearance: 132 mL/min (A) (by C-G formula based on SCr of 0.46 mg/dL (L)).   Assessment: [name] admitted 9/4 for megacolon and severe obstructive constipation with severely dilated colon and colitis s/p colectomy 9/10 and found to have bilateral pulmonary emboli and DVT (presumed to be related to GI issue impeding venous return). Initially treated with heparin infusion but stopped d/t continued bleeding from JP drain requiring PRBC and FFP 9/12 and another 2 units PRBC 9/14. Subsequently, IVC Filter placed 9/15. Pharmacy consulted 9/21 to switch to Lovenox.   Hgb stable at 11.2 with platelets also stable and WNL. The patient's SCr also remains stable at 0.46 today (CrCl>120 ml/min). The patient's weight has been relatively stable, at approximately 70 kg. No documented bleeding, and no refusals per MAR. Will continue enoxaparin at 1mg /kg for DVT/PE treatment.   Goal of Therapy:  Monitor platelets by anticoagulation protocol: Yes   Plan:  Continue enoxaparin 70 mg Bode q12h CBC every 72 hours Monitor for signs/symptoms of bleeding   Harlow Mares, PharmD PGY1 Pharmacy Resident Phone [phone]  10/12/2017   10:35 AM

## 2017-10-12 NOTE — Progress Notes (Addendum)
RT called to the room to assess pt.  Pt super anxious but no respiratory distress noted and SATS remained 95-96%.  E-link MD wanted pt placed back on the vent on PS. Pt was placed back on vent on PS per order.   MCC at bedside and want pt back on ATC 35%, what he was on previous to placing back on vent.  Pt now back on ATC 35% Sp02 96%. Will continue to monitor.

## 2017-10-12 NOTE — Progress Notes (Signed)
  Speech Language Pathology Treatment: Hillary Bow Speaking valve  Patient Details Name: Philip Thompson MRN: [id number] DOB: [birth year] Today's Date: 10/12/2017 Time: 4098-1191 SLP Time Calculation (min) (ACUTE ONLY): 21 min  Assessment / Plan / Recommendation Clinical Impression  Pt wore his PMV for 20 min under direct SLP supervision with strong, clear phonation heard. His mother verbalized her understanding of PMV precautions with Min cues. We discussed the benefits of PMV use in addition to facilitating conversation. Given that pt continues to wear PMV with evidence of good upper airway patency, recommend that he wear it during waking hours as tolerated with intermittent supervision. PMV was left in place upon completion of session.    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  He had acute aspiration pneumonia on 9/10 prior to surgery in the setting of nausea/vomiting.  He went to the OR on 9/10 for total colectomy and ileostomy.  Extubated 9/12, Reintubated 9/13. 9/18 Trached and Cortrak placed PMH Anxiety/depression, hx of substance abuse disorder.  CXR 9/23: "Right lower lobe collapse/consolidative change. Given volume loss, at least partially felt to represent atelectasis. Infection or aspiration cannot be excluded."      SLP Plan  Continue with current plan of care       Recommendations         Patient may use Passy-Muir Speech Valve: During all waking hours (remove during sleep);During all therapies with supervision;Caregiver trained to provide supervision PMSV Supervision: Intermittent MD: Please consider changing trach tube to : Cuffless         Follow up Recommendations: Inpatient Rehab SLP Visit Diagnosis: Aphonia (R49.1) Plan: Continue with current plan of care       GO                Maxcine Ham 10/12/2017, 1:13 PM  Maxcine Ham,  M.A. CCC-SLP Acute Herbalist [phone] Office [phone]

## 2017-10-12 NOTE — Progress Notes (Signed)
15 Days Post-Op   Subjective/Chief Complaint: Had to go back on the vent overnight but only for a short time   Objective: Vital signs in last 24 hours: Temp:  [98.3 F (36.8 C)-99.5 F (37.5 C)] 98.7 F (37.1 C) (09/25 0713) Pulse Rate:  [27-158] 124 (09/25 0600) Resp:  [14-38] 24 (09/25 0600) BP: (106-140)/(48-95) 136/85 (09/25 0600) SpO2:  [91 %-99 %] 97 % (09/25 0600) FiO2 (%):  [35 %] 35 % (09/25 0600) Weight:  [67.3 kg] 67.3 kg (09/25 0332) Last BM Date: 10/10/17  Intake/Output from previous day: 09/24 0701 - 09/25 0700 In: 4257.4 [I.V.:3457.4; NG/GT:400; IV Piggyback:400] Out: 3550 [Urine:1050; Emesis/NG output:2100; Stool:400] Intake/Output this shift: No intake/output data recorded.  General appearance: cooperative Neck: trach in place Resp: clear to auscultation bilaterally Cardio: regular rate and rhythm GI: soft, incision CDI, ileostomy pink with output  Lab Results:  Recent Labs    10/11/17 0557 10/12/17 0500  WBC 13.0* 10.9*  HGB 11.1* 11.2*  HCT 35.8* 36.0*  PLT 318 305   BMET Recent Labs    10/11/17 0557 10/12/17 0500  NA 136 136  K 4.3 4.5  CL 102 99  CO2 25 26  GLUCOSE 106* 109*  BUN 17 18  CREATININE 0.46* 0.46*  CALCIUM 8.9 8.8*   PT/INR No results for input(s): LABPROT, INR in the last 72 hours. ABG No results for input(s): PHART, HCO3 in the last 72 hours.  Invalid input(s): PCO2, PO2  Studies/Results: Dg Abd 1 View  Result Date: 10/10/2017 CLINICAL DATA:  27 year old male requiring feeding tube for nutrition. Fluoroscopic advancement of intragastric feeding tube requested. EXAM: ABDOMEN - 1 VIEW COMPARISON:  10/08/2017. FLUOROSCOPY TIME:  2 minutes 24 seconds FINDINGS: A single fluoroscopic image demonstrates feeding tube tracking from the stomach into the proximal duodenum. A small volume of contrast injected opacifies the proximal duodenum. Superimposed NG type enteric tube in the stomach. IVC filter in place. Abdominal skin  staples redemonstrated. IMPRESSION: Fluoroscopic advancement of feeding tube tip to the proximal duodenum. Electronically Signed   By: Odessa Fleming M.D.   On: 10/10/2017 11:08   Dg Chest Port 1 View  Result Date: 10/11/2017 CLINICAL DATA:  Acute respiratory failure.  Hypoxia. EXAM: PORTABLE CHEST 1 VIEW COMPARISON:  10/10/2017. FINDINGS: Tracheostomy tube, feeding tube, NG tube, right PICC line in unchanged position. Heart size normal. Right base atelectatic changes and consolidation again noted. Atelectasis. No pleural effusion or pneumothorax. IMPRESSION: 1.  Lines and tubes stable position. 2. Right base atelectatic changes and consolidation again noted. Continued follow-up exam to demonstrate resolution. Electronically Signed   By: Maisie Fus  Register   On: 10/11/2017 10:06    Anti-infectives: Anti-infectives (From admission, onward)   Start     Dose/Rate Route Frequency Ordered Stop   10/09/17 1730  ceFAZolin (ANCEF) IVPB 2g/100 mL premix     2 g 200 mL/hr over 30 Minutes Intravenous Every 8 hours 10/09/17 1641 10/16/17 2359   10/08/17 0815  erythromycin ethylsuccinate (EES) 200 MG/5ML suspension 200 mg  Status:  Discontinued     200 mg Per Tube Every 6 hours 10/08/17 0807 10/09/17 1636   09/27/17 1000  piperacillin-tazobactam (ZOSYN) IVPB 3.375 g  Status:  Discontinued     3.375 g 12.5 mL/hr over 240 Minutes Intravenous Every 8 hours 09/27/17 0845 10/06/17 1504      Assessment/Plan: S/P subtotal colectomy with ileostomy 9/10 Dr. Magnus Ivan - Gastric atony, continue NG to suction and post pyloric TF. Add reglan to see if  we can decrease NGT output PE - Lovenox Acute resp failure - on HTC again now (vent briefly overnight), per CCM PT/OT - rec CIR  I spoke with his family at the bedside.  LOS: 20 days    Liz Malady 10/12/2017

## 2017-10-12 NOTE — Progress Notes (Signed)
Dr. Christena Deem came to see patient while he was on the vent but wanted patient to be switched to Trach collar. Fentanyl was given but not Versed. Patient is not in any distress. He is currently resting in bed and following commands.

## 2017-10-12 NOTE — Consult Note (Signed)
[facility] Consult   Reason for Consult:  SI Referring Physician:  Dr. Patrecia Pour Patient Identification: Philip Thompson MRN:  [id number] Principal Diagnosis: Anxiety and depression Diagnosis:   Patient Active Problem List   Diagnosis Date Noted  . Protein calorie malnutrition ([place]) [E46]   . Tracheostomy care ([place]) [Z43.0]   . Tracheostomy in place ([place]) [Z93.0]   . History of traumatic brain injury [Z87.820]   . Anoxic brain injury ([place]) [G93.1]   . Chronic pain syndrome [G89.4]   . Sinus tachycardia [R00.0]   . Tachypnea [R06.82]   . Leukocytosis [D72.829]   . Pressure injury of skin [L89.90] 10/08/2017  . Acute respiratory failure with hypoxemia ([place]) [J96.01]   . Protein-calorie malnutrition, severe [E43] 09/28/2017  . Preoperative cardiovascular examination [Z01.810]   . Acute respiratory failure with hypoxia ([place]) [J96.01] 09/22/2017  . Acute pulmonary embolism without acute cor pulmonale ([place]) [I26.99] 09/22/2017  . Hypothyroidism [E03.9] 09/22/2017  . Colon distention [K63.89]   . 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 Thrombocytopenia ([place]) [D69.6] 09/18/2017  . Hemoptysis [R04.2] 09/18/2017  . Megacolon [K59.39] 09/18/2017  . Fecal impaction ([place]) [K56.41]   . Impaction of colon ([place]) [K56.49]   . Obstipation [K59.00] 09/16/2017  . Constipation [K59.00]   . Leg edema [R60.0]   . Acute megacolon ([place]) [K59.31] 09/15/2017  . Hypokalemia [E87.6] 09/15/2017  . Tobacco use [Z72.0] 09/15/2017  . Traumatic brain injury ([place]) [S06.9X9A] 04/19/2017  . Mild cognitive disorder [F09] 04/19/2017  . MDD (major depressive disorder), recurrent severe, without psychosis ([place]) [F33.2] 03/25/2017  . Polysubstance abuse ([place]) [F19.10] 10/15/2016  . Substance or medication-induced bipolar and related disorder with onset during intoxication ([place]) [S82.707, F19.94] 07/05/2016  . Opioid use disorder, moderate, dependence ([place]) [F11.20] 07/05/2016  . Cannabis use  disorder, severe, dependence ([place]) [F12.20] 07/05/2016  . Chronic back pain [M54.9, G89.29] 07/05/2012    Total Time spent with patient: 1 hour  Subjective:   Philip Thompson is a 27 y.o. male patient admitted with colonic inertia, megacolon and severe obstructive constipation.  HPI:   Per chart review, patient was admitted on 9/4 with colonic inertia, megacolon and severe obstructive constipation who is s/p colectomy and ileostomy on 9/10. Of note, he was last seen by his outpatient psychiatrist on 8/21 and was doing well. Home medications include Trazodone 100 mg qhs, Melatonin 9 mg qhs, Seroquel 800 mg qhs (prescribed 200 mg qhs in the hospital), Depakote 500 mg BID, Celexa 40 mg daily (prescribed 20 mg in the hospital), Buspar 15 mg BID PRN (prescribed 10 mg TID in the hospital) and Gabapentin 600 mg TID (was increased at last follow up on 8/21 for anxiety and chronic pain related to brain and back injury). He is prescribed 300 mg BID in the hospital.   On interview, Philip Thompson reports a decrease in his mood and worsening anxiety due to his current medical condition and prolonged hospitalization.  He reports feeling anxious about his tracheostomy and the collar around his neck.  He reports waking up scared due to difficulty breathing.  He also reports poor sleep with problems initiating and maintaining sleep.  He reports that sleep has always been a problem although it is worse since hospitalization.  He was previously taking 400 mg of Seroquel before bedtime and 400 mg as needed for nighttime awakenings.  His mother was at bedside with his verbal consent.  She reports that she has been providing emotional support for him.  He reports that  his father has dementia and is "useless" with helping him when he comes to visit.  He denies SI, HI or AVH.  He denies a history of suicide attempts.  His mother denies any concerns for his safety.  Past Psychiatric History: MDD, substance induced mood  disorder and polysubstance abuse (amphetamine and THC).   Risk to Self:  None. Denies SI.  Risk to Others:  None. Denies HI.  Prior Inpatient Therapy:  He was hospitalized multiple times at [facility]. He was last hospitalized in 03/2017 for depression, psychosis and SI with plan.  Prior Outpatient Therapy:  He has seen at [facility] and [facility] in the past. He is currently seen at the outpatient clinic at [facility].   Past Medical History:  Past Medical History:  Diagnosis Date  . ADD (attention deficit disorder)   . Aggression   . Allergic rhinitis   . Anxiety   . Arthritis    joint pain  . Cardiomyopathy ([place])    a. EF <25% in 2015 during admission for multi substance intoxication/respiratory failure requiring intubation.  . Chronic back pain   . Constipation   . Depression   . Fecal obstruction ([place])   . Frontal head injury   . Heroin overdose ([place])   . Obstipation   . Polysubstance abuse ([place])   . Suicide attempt [facility])     Past Surgical History:  Procedure Laterality Date  . COLECTOMY N/A 09/27/2017   Procedure: SUBTOTAL COLECTOMY AND ILEOSTOMY;  Surgeon: Coralie Keens, MD;  Location: [facility];  Service: General;  Laterality: N/A;  . FOOT SURGERY     to get a BB out  . IR IVC FILTER PLMT / S&I /IMG GUID/MOD SED  10/02/2017  . NO PAST SURGERIES     Family History:  Family History  Problem Relation Age of Onset  . Dementia Father   . Alcohol abuse Father   . Diabetes Brother   . Cancer - Ovarian Maternal Grandmother   . Cancer - Lung Paternal Grandmother   . Cancer Paternal Grandfather   . Diabetes Other   . Hypertension Other   . Heart disease Other   . Heart attack Other    Family Psychiatric  History: Father-PTSD and dementia.  Social History:  Social History   Substance and Sexual Activity  Alcohol Use Not Currently   Comment: hx of. last use 01/2016     Social History   Substance and Sexual Activity  Drug Use Yes  . Types: Marijuana, Cocaine   Comment: last  used marijuana 3 weeks ago. heroin last used  6 months ago.     Social History   Socioeconomic History  . Marital status: Single    Spouse name: Not on file  . Number of children: Not on file  . Years of education: Not on file  . Highest education level: 12th grade  Occupational History  . Not on file  Social Needs  . Financial resource strain: Somewhat hard  . Food insecurity:    Worry: Never true    Inability: Never true  . Transportation needs:    Medical: No    Non-medical: No  Tobacco Use  . Smoking status: Current Every Day Smoker    Packs/day: 1.00    Years: 7.00    Pack years: 7.00    Types: Cigarettes  . Smokeless tobacco: Never Used  Substance and Sexual Activity  . Alcohol use: Not Currently    Comment: hx of. last use 01/2016  .  Drug use: Yes    Types: Marijuana, Cocaine    Comment: last used marijuana 3 weeks ago. heroin last used  6 months ago.   Marland Kitchen Sexual activity: Yes    Birth control/protection: Condom  Lifestyle  . Physical activity:    Days per week: 0 days    Minutes per session: 0 min  . Stress: To some extent  Relationships  . Social connections:    Talks on phone: More than three times a week    Gets together: Twice a week    Attends religious service: Never    Active member of club or organization: No    Attends meetings of clubs or organizations: Never    Relationship status: Never married  Other Topics Concern  . Not on file  Social History Narrative  . Not on file   Additional Social History: He lives with his father. He denies alcohol or illicit substance use.     Allergies:  No Known Allergies  Labs:  Results for orders placed or performed during the hospital encounter of 09/21/17 (from the past 48 hour(s))  Glucose, capillary     Status: Abnormal   Collection Time: 10/10/17  6:25 PM  Result Value Ref Range   Glucose-Capillary 112 (H) 70 - 99 mg/dL  Glucose, capillary     Status: Abnormal   Collection Time: 10/11/17 12:00 AM   Result Value Ref Range   Glucose-Capillary 106 (H) 70 - 99 mg/dL  Glucose, capillary     Status: Abnormal   Collection Time: 10/11/17  4:06 AM  Result Value Ref Range   Glucose-Capillary 122 (H) 70 - 99 mg/dL  CBC     Status: Abnormal   Collection Time: 10/11/17  5:57 AM  Result Value Ref Range   WBC 13.0 (H) 4.0 - 10.5 K/uL   RBC 3.94 (L) 4.22 - 5.81 MIL/uL   Hemoglobin 11.1 (L) 13.0 - 17.0 g/dL   HCT 35.8 (L) 39.0 - 52.0 %   MCV 90.9 78.0 - 100.0 fL   MCH 28.2 26.0 - 34.0 pg   MCHC 31.0 30.0 - 36.0 g/dL   RDW 14.1 11.5 - 15.5 %   Platelets 318 150 - 400 K/uL    Comment: Performed at [facility], [place] [address], [place], [place] [address]  Comprehensive metabolic panel     Status: Abnormal   Collection Time: 10/11/17  5:57 AM  Result Value Ref Range   Sodium 136 135 - 145 mmol/L   Potassium 4.3 3.5 - 5.1 mmol/L   Chloride 102 98 - 111 mmol/L   CO2 25 22 - 32 mmol/L   Glucose, Bld 106 (H) 70 - 99 mg/dL   BUN 17 6 - 20 mg/dL   Creatinine, Ser 0.46 (L) 0.61 - 1.24 mg/dL   Calcium 8.9 8.9 - 10.3 mg/dL   Total Protein 6.3 (L) 6.5 - 8.1 g/dL   Albumin 2.3 (L) 3.5 - 5.0 g/dL   AST 20 15 - 41 U/L   ALT 50 (H) 0 - 44 U/L   Alkaline Phosphatase 116 38 - 126 U/L   Total Bilirubin 0.4 0.3 - 1.2 mg/dL   GFR calc non Af Amer >60 >60 mL/min   GFR calc Af Amer >60 >60 mL/min    Comment: (NOTE) The eGFR has been calculated using the CKD EPI equation. This calculation has not been validated in all clinical situations. eGFR's persistently <60 mL/min signify possible Chronic Kidney Disease.    Anion gap  9 5 - 15    Comment: Performed at [facility], [place] [address] [place], [place] [address]  Prealbumin     Status: None   Collection Time: 10/11/17  5:57 AM  Result Value Ref Range   Prealbumin 19.3 18 - 38 mg/dL    Comment: Performed at [facility] [address] [place], [place] [address]  Magnesium     Status: None   Collection Time: 10/11/17  5:57 AM   Result Value Ref Range   Magnesium 1.8 1.7 - 2.4 mg/dL    Comment: Performed at [facility] [address] [place], [place] [address]  Phosphorus     Status: None   Collection Time: 10/11/17  5:57 AM  Result Value Ref Range   Phosphorus 3.3 2.5 - 4.6 mg/dL    Comment: Performed at [facility] [address] [place], [place] [address]  Differential     Status: Abnormal   Collection Time: 10/11/17  5:57 AM  Result Value Ref Range   Neutrophils Relative % 74 %   Neutro Abs 9.6 (H) 1.7 - 7.7 K/uL   Lymphocytes Relative 10 %   Lymphs Abs 1.3 0.7 - 4.0 K/uL   Monocytes Relative 14 %   Monocytes Absolute 1.9 (H) 0.1 - 1.0 K/uL   Eosinophils Relative 1 %   Eosinophils Absolute 0.1 0.0 - 0.7 K/uL   Basophils Relative 0 %   Basophils Absolute 0.1 0.0 - 0.1 K/uL   Immature Granulocytes 1 %   Abs Immature Granulocytes 0.2 (H) 0.0 - 0.1 K/uL    Comment: Performed at [facility] [address] [place], [place] [address]  Triglycerides     Status: None   Collection Time: 10/11/17  5:58 AM  Result Value Ref Range   Triglycerides 45 <150 mg/dL    Comment: Performed at [facility] [address], [place], [place] [address]  Glucose, capillary     Status: Abnormal   Collection Time: 10/11/17  8:05 AM  Result Value Ref Range   Glucose-Capillary 101 (H) 70 - 99 mg/dL  Glucose, capillary     Status: None   Collection Time: 10/11/17 11:44 AM  Result Value Ref Range   Glucose-Capillary 99 70 - 99 mg/dL  Glucose, capillary     Status: None   Collection Time: 10/11/17  3:04 PM  Result Value Ref Range   Glucose-Capillary 97 70 - 99 mg/dL  Glucose, capillary     Status: Abnormal   Collection Time: 10/11/17  7:24 PM  Result Value Ref Range   Glucose-Capillary 117 (H) 70 - 99 mg/dL  Glucose, capillary     Status: Abnormal   Collection Time: 10/11/17 11:41 PM  Result Value Ref Range   Glucose-Capillary 110 (H) 70 - 99 mg/dL  Glucose, capillary     Status:  Abnormal   Collection Time: 10/12/17  3:32 AM  Result Value Ref Range   Glucose-Capillary 118 (H) 70 - 99 mg/dL  CBC     Status: Abnormal   Collection Time: 10/12/17  5:00 AM  Result Value Ref Range   WBC 10.9 (H) 4.0 - 10.5 K/uL   RBC 4.00 (L) 4.22 - 5.81 MIL/uL   Hemoglobin 11.2 (L) 13.0 - 17.0 g/dL   HCT 36.0 (L) 39.0 - 52.0 %   MCV 90.0 78.0 - 100.0 fL   MCH 28.0 26.0 - 34.0 pg   MCHC 31.1 30.0 - 36.0  g/dL   RDW 14.1 11.5 - 15.5 %   Platelets 305 150 - 400 K/uL    Comment: Performed at [facility], [place] [address], [place], [place] [address]  Basic metabolic panel     Status: Abnormal   Collection Time: 10/12/17  5:00 AM  Result Value Ref Range   Sodium 136 135 - 145 mmol/L   Potassium 4.5 3.5 - 5.1 mmol/L   Chloride 99 98 - 111 mmol/L   CO2 26 22 - 32 mmol/L   Glucose, Bld 109 (H) 70 - 99 mg/dL   BUN 18 6 - 20 mg/dL   Creatinine, Ser 0.46 (L) 0.61 - 1.24 mg/dL   Calcium 8.8 (L) 8.9 - 10.3 mg/dL   GFR calc non Af Amer >60 >60 mL/min   GFR calc Af Amer >60 >60 mL/min    Comment: (NOTE) The eGFR has been calculated using the CKD EPI equation. This calculation has not been validated in all clinical situations. eGFR's persistently <60 mL/min signify possible Chronic Kidney Disease.    Anion gap 11 5 - 15    Comment: Performed at [facility] [address], [place], [place] [address]  Magnesium     Status: None   Collection Time: 10/12/17  5:00 AM  Result Value Ref Range   Magnesium 1.9 1.7 - 2.4 mg/dL    Comment: Performed at [facility], [place] [address] [place], [place] [address]  Phosphorus     Status: None   Collection Time: 10/12/17  5:00 AM  Result Value Ref Range   Phosphorus 3.5 2.5 - 4.6 mg/dL    Comment: Performed at [facility] [address] [place],  [address]  Glucose, capillary     Status: Abnormal   Collection Time: 10/12/17  7:12 AM  Result Value Ref Range   Glucose-Capillary 116 (H) 70 - 99 mg/dL    Current  Facility-Administered Medications  Medication Dose Route Frequency Provider Last Rate Last Dose  . acetaminophen (TYLENOL) solution 650 mg  650 mg Oral Q6H PRN Kipp Brood, MD   650 mg at 10/12/17 0040  . albuterol (PROVENTIL) (2.5 MG/3ML) 0.083% nebulizer solution 2.5 mg  2.5 mg Nebulization Q4H Mauri Brooklyn, MD   2.5 mg at 10/12/17 1141  . busPIRone (BUSPAR) tablet 10 mg  10 mg Per Tube TID Jennelle Human B, NP   10 mg at 10/12/17 1003  . ceFAZolin (ANCEF) IVPB 2g/100 mL premix  2 g Intravenous Q8H Ollis, Brandi L, NP   Stopped at 10/12/17 1027  . chlorhexidine gluconate (MEDLINE KIT) (PERIDEX) 0.12 % solution 15 mL  15 mL Mouth Rinse BID Erick Colace, NP   15 mL at 10/12/17 0801  . Chlorhexidine Gluconate Cloth 2 % PADS 6 each  6 each Topical Daily Aviva Signs, MD   6 each at 10/12/17 1007  . citalopram (CELEXA) 10 MG/5ML suspension 20 mg  20 mg Per Tube Daily Jennelle Human B, NP   20 mg at 10/12/17 1000  . clonazePAM (KLONOPIN) tablet 1 mg  1 mg Per Tube Q8H Ollis, Brandi L, NP   1 mg at 10/12/17 0520  . dextrose 5 %-0.45 % sodium chloride infusion   Intravenous Continuous Jennelle Human B, NP 75 mL/hr at 10/12/17 1100    . enoxaparin (LOVENOX) injection 70 mg  70 mg Subcutaneous Q12H Kris Mouton, RPH   70 mg at 10/12/17 0756  . feeding supplement (VITAL AF 1.2 CAL)  liquid 1,000 mL  1,000 mL Per Tube Continuous Rush Farmer, MD 80 mL/hr at 10/12/17 0730    . fentaNYL (SUBLIMAZE) injection 25-50 mcg  25-50 mcg Intravenous Q2H PRN Shearon Stalls, Rahul P, PA-C   25 mcg at 10/12/17 0520  . gabapentin (NEURONTIN) 250 MG/5ML solution 300 mg  300 mg Per Tube Q12H Jennelle Human B, NP   300 mg at 10/12/17 0958  . insulin aspart (novoLOG) injection 0-9 Units  0-9 Units Subcutaneous Q6H Priscella Mann, RPH   1 Units at 10/09/17 2341  . levothyroxine (SYNTHROID, LEVOTHROID) tablet 75 mcg  75 mcg Per Tube QAC breakfast Juanito Doom, MD   75 mcg at 10/12/17 0807  . MEDLINE mouth rinse  15  mL Mouth Rinse 10 times per day Simonne Maffucci B, MD   15 mL at 10/12/17 1009  . Melatonin TABS 9 mg  9 mg Per Tube QHS PRN Magdalen Spatz, NP   9 mg at 10/10/17 2330  . metoCLOPramide (REGLAN) injection 10 mg  10 mg Intravenous Q6H Georganna Skeans, MD   10 mg at 10/12/17 1007  . midazolam (VERSED) 2 MG/2ML injection           . nicotine (NICODERM CQ - dosed in mg/24 hours) patch 21 mg  21 mg Transdermal Daily Rush Farmer, MD   21 mg at 10/12/17 1005  . ondansetron (ZOFRAN) injection 4 mg  4 mg Intravenous Q6H PRN Earnstine Regal, PA-C   4 mg at 10/07/17 2112  . oxyCODONE (Oxy IR/ROXICODONE) immediate release tablet 5 mg  5 mg Per Tube Q6H Desai, Rahul P, PA-C   5 mg at 10/12/17 0756  . QUEtiapine (SEROQUEL) tablet 200 mg  200 mg Per Tube QHS Rush Farmer, MD   200 mg at 10/11/17 2116  . sodium chloride flush (NS) 0.9 % injection 10-40 mL  10-40 mL Intracatheter Q12H Aviva Signs, MD   10 mL at 10/11/17 2117  . sodium chloride flush (NS) 0.9 % injection 10-40 mL  10-40 mL Intracatheter PRN Aviva Signs, MD   10 mL at 10/11/17 2116  . sodium chloride flush (NS) 0.9 % injection 3 mL  3 mL Intravenous Q12H Earnstine Regal, PA-C   3 mL at 10/11/17 0908  . TPN CYCLIC-ADULT (ION)   Intravenous Cyclic-See Admin Instructions Franky Macho, RPH      . traZODone (DESYREL) tablet 100 mg  100 mg Oral QHS Jennelle Human B, NP   100 mg at 10/11/17 2115  . valproic acid (DEPAKENE) solution 500 mg  500 mg Per Tube BID Shearon Stalls, Rahul P, PA-C   500 mg at 10/12/17 1001    Musculoskeletal: Strength & Muscle Tone: within normal limits Gait & Station: unable to stand Patient leans: N/A  Psychiatric Specialty Exam: Physical Exam  Nursing note and vitals reviewed. Constitutional: He is oriented to person, place, and time. He appears well-developed and well-nourished.  HENT:  Head: Normocephalic and atraumatic.  Neck: Normal range of motion.  Respiratory: Effort normal.  Musculoskeletal: Normal  range of motion.  Neurological: He is alert and oriented to person, place, and time.  Psychiatric: His speech is normal and behavior is normal. Judgment and thought content normal. His mood appears anxious. Cognition and memory are normal.    Review of Systems  Constitutional: Negative for chills and fever.  Cardiovascular: Negative for chest pain.  Gastrointestinal: Negative for abdominal pain, nausea and vomiting.  Psychiatric/Behavioral: Negative for hallucinations, substance abuse and suicidal ideas.  The patient is nervous/anxious and has insomnia.   All other systems reviewed and are negative.   Blood pressure 119/84, pulse (!) 132, temperature 98.7 F (37.1 C), temperature source Oral, resp. rate (!) 24, height 5' 7.99" (1.727 m), weight 67.3 kg, SpO2 95 %.Body mass index is 22.56 kg/m.  General Appearance: Fairly Groomed, young, Caucasian male, wearing a hospital gown with tracheostomy and NG tube who is lying in bed. NAD.   Eye Contact:  Good  Speech:  Clear and Coherent and Slow with hoarse voice.   Volume:  Normal  Mood:  Anxious  Affect:  Congruent  Thought Process:  Goal Directed, Linear and Descriptions of Associations: Intact  Orientation:  Full (Time, Place, and Person)  Thought Content:  Logical  Suicidal Thoughts:  No  Homicidal Thoughts:  No  Memory:  Immediate;   Good Recent;   Good Remote;   Good  Judgement:  Fair  Insight:  Fair  Psychomotor Activity:  Normal  Concentration:  Concentration: Good and Attention Span: Good  Recall:  Good  Fund of Knowledge:  Good  Language:  Good  Akathisia:  No  Handed:  Right  AIMS (if indicated):   N/A  Assets:  Communication Skills Desire for Improvement Housing Social Support  ADL's:  Impaired  Cognition:  WNL  Sleep:   Poor   Assessment:  Philip Thompson is a 27 y.o. male who was admitted with colonic inertia, megacolon and severe obstructive constipation who is s/p colectomy and ileostomy on 9/10. He reports  worsening anxiety and mood since hospitalization due to medical condition. Recommend PRN Seroquel for anxiety and insomnia and PRN Buspar for anxiety. He denies SI, HI or AVH. His mother does not have concerns for his safety.   Treatment Plan Summary: -Continue Trazodone 100 mg qhs for insomnia. -Continue Seroquel 200 mg qhs for mood stabilization and insomnia. Give 100 mg qhs PRN for insomnia.  -Continue Melatonin 9 mg qhs for insomnia. -Continue Depakote 500 mg BID for mood stabilization. -Continue Celexa 20 mg daily for depression. Can increase back to home dose of 20 mg daily if patient continues to report depressive symptoms due to prolonged hospitalization.  -Switch Buspar 10 mg TID to 10 mg BID with 5 mg BID PRN for anxiety. Patient believes that PRN dose is most effective for anxiety. -Continue Gabapentin 300 mg BID for anxiety and chronic pain.  -Continue Klonopin 1 mg q hours PRN for anxiety.  -EKG reviewed and QTc 438 on 9/6. Please closely monitor when starting or increasing QTc prolonging agents. -Will sign off on patient at this time. Please consult psychiatry again as needed.     Disposition: No evidence of imminent risk to self or others at present.   Patient does not meet criteria for psychiatric inpatient admission.  Faythe Dingwall, DO 10/12/2017 12:07 PM

## 2017-10-12 NOTE — Progress Notes (Signed)
PCCM Interval Progress Note  Pt with agitation, tachypnea, tachycardia.  Just placed back on PSV by [facility] MD.  Difficult to control pt's agitation / pain given underlying hx substance abuse.  Will order low dose fentanyl and midazolam PRN to assist with agitation / pain. Of note, he had opposite reaction to haldol 1 week ago when I took care of him.  Day team to consider increasing doseage of psych meds up to preadmission regimen and hopefully can avoid excessive use of fent / midaz.   Rutherford Guys, [place] [facility] Pager: [phone]  or [phone] 10/12/2017, 12:19 AM

## 2017-10-12 NOTE — Progress Notes (Signed)
CPT not performed due to [name] HR in the upper 140's.

## 2017-10-12 NOTE — Progress Notes (Signed)
Pharmacy Antibiotic Note  Philip Thompson is a 27 y.o. male admitted on 09/21/2017 with abdominal pain. Found to have megacolon and severe obstructive constipation with severely dilated colon. Abdominal colectomy with ileostomy performed 9/10. Initially completed 10 day course of Zosyn for suspected aspiration pneumonia and intraabdominal infection. New BAL culture 9/18 growing Staph aureus and pan-sensitive Ecoli. Pharmacy has been consulted for cefazolin dosing.   Patient has been afebrile afebrile with WBC trending down to 10.9. SCr also stable at 0.46 with CrCl >138m/min. Susceptibilities for staph aureus have been sent out to [facility]house lab unable to determine susceptibility. Per [facility] results expected 9/29.    Plan: Continue cefazolin 2gm IV Q8H F/u renal fxn, C&S, clinical status   Height: 5' 7.99" (172.7 cm) Weight: 148 lb 5.9 oz (67.3 kg) IBW/kg (Calculated) : 68.38  Temp (24hrs), Avg:98.8 F (37.1 C), Min:98.3 F (36.8 C), Max:99.5 F (37.5 C)  Recent Labs  Lab 10/08/17 0435 10/09/17 0448 10/10/17 0343 10/11/17 0557 10/12/17 0500  WBC 14.2* 25.7* 18.2* 13.0* 10.9*  CREATININE 0.48* 0.43* 0.50* 0.46* 0.46*    Estimated Creatinine Clearance: 132 mL/min (A) (by C-G formula based on SCr of 0.46 mg/dL (L)).    No Known Allergies  Antimicrobials this admission: Zosyn 9/10 >> 9/19 Cefazolin 9/22> (9/29)  Microbiology results: 9/10 MRSA PCR - negative 9/11 TA - normal flora 9/18 BAL cx: 30k Staph aureus + 20k pan-sensitive Ecoli    ([facility]- per [facility] results expected 9/29)   Thank you for allowing pharmacy to be a part of this patient's care.  ABrendolyn Patty PharmD PGY1 Pharmacy Resident Phone [phone] 10/12/2017   11:54 AM

## 2017-10-12 NOTE — Progress Notes (Signed)
PROGRESS NOTE [facility]   Philip Thompson   SWF:[id number] DOB: [birth year]  DOA: 09/21/2017 PCP: [name], MD   Brief Narrative:  Philip Thompson 27 year old male with medical history significant for polysubstance abuse, major depression, anxiety chronic constipation and hypothyroidism presented to the emergency department complaining of severe abdominal pain, distention associated with nausea and vomiting.  Upon ED evaluation patient found to have colonic inertia/megacolon secondary to constipation along with acute respiratory failure where he was found to have pulmonary embolism.  Patient was admitted with working diagnosis of acute respiratory failure with hypoxia and possible Ogilvie syndrome.  Significant events: 9/10 -total colectomy and ileostomy.  Was found to have a massive distended colon from cecum all the way to the distal rectum and pelvic ring. JP drain  9/10 intubation, unable to extubate 9/11 PICC line 9/15 place retrievable IVC filter due to JP drain bleeding while on anti-coagulation 9/18 Shiley trach cuffed 9/22 started on Precedex for agitation, successfully weaned off on 9/23. 9/23 JP drain out   Subjective: Patient seen and examined, overnight patient was placed in complaint for short period of time  Assessment & Plan:   Principal Problem:   Ogilvie's syndrome Active Problems:   MDD (major depressive disorder), recurrent severe, without psychosis ([place])   Leg edema   Fecal impaction (HCC)   Acute respiratory failure with hypoxia ([place])   Acute pulmonary embolism without acute cor pulmonale (HCC)   Hypothyroidism   Colon distention   Preoperative cardiovascular examination   Protein-calorie malnutrition, severe   Acute respiratory failure with hypoxemia (HCC)   Pressure injury of skin   Protein calorie malnutrition ([place])   Tracheostomy care ([place])   Tracheostomy in place [facility])   History of traumatic brain injury   Anoxic brain injury  ([place])   Chronic pain syndrome   Sinus tachycardia   Tachypnea   Leukocytosis  Acute respiratory failure with hypoxia, felt to be secondary to PE and mucous plugging ATC as tolerated, patient was off vent for over 48 hours however last night due to anxiety attack was placed on ventilator for about 10 minutes.  Continue ATC as tolerated.  Continue PSM.  Pulmonary hygiene.  Ambulate, PT/OT out of chair as tolerated.  Trach care per pulmonary team.  Check intermittent x-rays.  Ogilvie syndrome status post colectomy and ileostomy Patient with NG tube and not much output.  Continue management per general surgery.  Hopefully can remove NG tube in the next couple of days and start thinking about enteral feeding. High output ileostomy/NG, continue to monitor BMP patient at risk of alkalosis.  Provoke PE/DVT from bowel obstruction/abdominal compartment syndrome Initially was on anticoagulation which was held due to TPA bleed on 9/14.  Status post retrievable filter on 9/15.  CT with no evidence of right ventricular strain.  Currently on full dose Lovenox.  IR recommending to retrieve filter in 4 weeks (~10/15).  Monitor for signs of bleeding, monitor CBC.  Sepsis due to staph aureus and E. coli Met sepsis criteria with tachycardia, leukocytosis and fever.  Positive BAL for staph of renal calculi.  Right lower lobe collapse/atelectasis on chest x-ray. Patient currently on Ancef, continue for now.  Follow cultures.  Protein calorie malnutrition Patient currently on NG feeds and TPN.  We will continue to monitor and discontinue TPN if feedings are tolerated.  Anxiety/depression Patient with multiple psych medications and standing abuse prior to admission.  Per nursing staff patient report suicidal ideas.  Psych has been consulted  for evaluation and medication reconciliation.  She currently on Klonopin, Celexa, gabapentin, Seroquel, trazodone and valproic acid.  Per discussion with psychiatry will add  BuSpar as needed for anxiety and Seroquel as needed for insomnia.  Will need psych follow-up as an outpatient.  Hypothyroidism Stable continue Synthroid.  DVT prophylaxis: Full dose Lovenox Code Status: Full code Family Communication: Mother at bedside Disposition Plan: CIR when medically stable  Consultants:   General surgery  IR  PCCM  GI   Psychiatry   Antimicrobials: Anti-infectives (From admission, onward)   Start     Dose/Rate Route Frequency Ordered Stop   10/09/17 1730  ceFAZolin (ANCEF) IVPB 2g/100 mL premix     2 g 200 mL/hr over 30 Minutes Intravenous Every 8 hours 10/09/17 1641 10/16/17 2359   10/08/17 0815  erythromycin ethylsuccinate (EES) 200 MG/5ML suspension 200 mg  Status:  Discontinued     200 mg Per Tube Every 6 hours 10/08/17 0807 10/09/17 1636   09/27/17 1000  piperacillin-tazobactam (ZOSYN) IVPB 3.375 g  Status:  Discontinued     3.375 g 12.5 mL/hr over 240 Minutes Intravenous Every 8 hours 09/27/17 0845 10/06/17 1504       Objective: Vitals:   10/12/17 1300 10/12/17 1400 10/12/17 1500 10/12/17 1554  BP:  127/85 (!) 141/126 131/73  Pulse: (!) 125 (!) 122 (!) 122 (!) 101  Resp: (!) 28 (!) 22 (!) 25 19  Temp:      TempSrc:      SpO2: 98% 97% 97% 96%  Weight:      Height:        Intake/Output Summary (Last 24 hours) at 10/12/2017 1626 Last data filed at 10/12/2017 1500 Gross per 24 hour  Intake 4777.95 ml  Output 3900 ml  Net 877.95 ml   Filed Weights   10/10/17 0350 10/11/17 0500 10/12/17 0332  Weight: 67.2 kg 71.2 kg 67.3 kg    Examination:  General exam: Appears calm and comfortable  HEENT: Trach in place, no secretions noted Respiratory system: Good air entry, mild diffuse rhonchi, no wheezing Cardiovascular system: S1 & S2 heard, RRR. No JVD, murmurs, rubs or gallops Gastrointestinal system: Abdomen is nondistended, soft and nontender.  Colostomy bag in place Central nervous system: Alert and oriented. No focal neurological  deficits. Extremities: No pedal edema. Skin: No rashes Psychiatry: Mood and affect flat.   Data Reviewed: I have personally reviewed following labs and imaging studies  CBC: Recent Labs  Lab 10/08/17 0435 10/09/17 0448 10/10/17 0343 10/11/17 0557 10/12/17 0500  WBC 14.2* 25.7* 18.2* 13.0* 10.9*  NEUTROABS  --   --   --  9.6*  --   HGB 11.4* 11.9* 11.4* 11.1* 11.2*  HCT 36.2* 36.8* 36.0* 35.8* 36.0*  MCV 89.6 89.5 90.7 90.9 90.0  PLT 217 314 269 318 004   Basic Metabolic Panel: Recent Labs  Lab 10/06/17 0415 10/08/17 0435 10/09/17 0448 10/10/17 0343 10/11/17 0557 10/12/17 0500  NA 138 135 139 137 136 136  K 4.0 4.0 3.4* 3.7 4.3 4.5  CL 107 97* 102 103 102 99  CO2 _0 GLUCOSE 116* 117* 83 127* 106* 109*  BUN _1 CREATININE 0.55* 0.48* 0.43* 0.50* 0.46* 0.46*  CALCIUM 7.8* 8.9 9.1 8.8* 8.9 8.8*  MG 1.8 2.0  --   --  1.8 1.9  PHOS 4.3 3.7  --   --  3.3 3.5   GFR: Estimated Creatinine Clearance: 132 mL/min (  A) (by C-G formula based on SCr of 0.46 mg/dL (L)). Liver Function Tests: Recent Labs  Lab 10/06/17 0415 10/08/17 0435 10/10/17 0343 10/11/17 0557  AST 21  --  30 20  ALT 39  --  63* 50*  ALKPHOS 89  --  98 116  BILITOT 0.4  --  0.4 0.4  PROT 5.2*  --  6.4* 6.3*  ALBUMIN 1.9* 2.3* 2.2* 2.3*   No results for input(s): LIPASE, AMYLASE in the last 168 hours. No results for input(s): AMMONIA in the last 168 hours. Coagulation Profile: No results for input(s): INR, PROTIME in the last 168 hours. Cardiac Enzymes: No results for input(s): CKTOTAL, CKMB, CKMBINDEX, TROPONINI in the last 168 hours. BNP (last 3 results) No results for input(s): PROBNP in the last 8760 hours. HbA1C: No results for input(s): HGBA1C in the last 72 hours. CBG: Recent Labs  Lab 10/11/17 1924 10/11/17 2341 10/12/17 0332 10/12/17 0712 10/12/17 1146  GLUCAP 117* 110* 118* 116* 110*   Lipid Profile: Recent Labs    10/11/17 0558  TRIG 45    Thyroid Function Tests: Recent Labs    10/10/17 1156  TSH 3.100   Anemia Panel: No results for input(s): VITAMINB12, FOLATE, FERRITIN, TIBC, IRON, RETICCTPCT in the last 72 hours. Sepsis Labs: No results for input(s): PROCALCITON, LATICACIDVEN in the last 168 hours.  Recent Results (from the past 240 hour(s))  Culture, bal-quantitative     Status: Abnormal (Preliminary result)   Collection Time: 10/05/17  3:40 PM  Result Value Ref Range Status   Specimen Description BRONCHIAL ALVEOLAR LAVAGE  Final   Special Requests NONE  Final   Gram Stain   Final    FEW WBC PRESENT, PREDOMINANTLY PMN RARE GRAM POSITIVE COCCI    Culture (A)  Final    30,000 COLONIES/mL STAPHYLOCOCCUS AUREUS 20,000 COLONIES/mL ESCHERICHIA COLI ORGANISM 1 Sent to [place] for further susceptibility testing. Performed at [facility], [place] [address] [place], [place] [address]    Report Status PENDING  Incomplete   Organism ID, Bacteria ESCHERICHIA COLI (A)  Final      Susceptibility   Escherichia coli - MIC*    AMPICILLIN <=2 SENSITIVE Sensitive     CEFAZOLIN <=4 SENSITIVE Sensitive     CEFEPIME <=1 SENSITIVE Sensitive     CEFTAZIDIME <=1 SENSITIVE Sensitive     CEFTRIAXONE <=1 SENSITIVE Sensitive     CIPROFLOXACIN <=0.25 SENSITIVE Sensitive     GENTAMICIN <=1 SENSITIVE Sensitive     IMIPENEM <=0.25 SENSITIVE Sensitive     TRIMETH/SULFA <=20 SENSITIVE Sensitive     AMPICILLIN/SULBACTAM <=2 SENSITIVE Sensitive     PIP/TAZO <=4 SENSITIVE Sensitive     Extended ESBL NEGATIVE Sensitive     * 20,000 COLONIES/mL ESCHERICHIA COLI  Susceptibility, Aer + Anaerob     Status: None   Collection Time: 10/05/17  4:00 PM  Result Value Ref Range Status   Suscept, Aer + Anaerob Preliminary report  Final    Comment: (NOTE) Performed At: [facility] [address] [place], [place] [id number] Rush Farmer MD [id number]    Source of Sample BRONCHIAL ALVEOLAR LAVAGE  Final    Comment: Performed  at [facility], [place] [address], [place], [place] [address]  Susceptibility Result     Status: None (Preliminary result)   Collection Time: 10/05/17  4:00 PM  Result Value Ref Range Status   Suscept Result 1 Staphylococcus aureus  Final    Comment: (NOTE) Identification performed by  account, not confirmed by this laboratory. Performed At: [facility] [place], [place] [id number] Rush Farmer MD [id number]    Antimicrobial Suscept PENDING  Incomplete      Radiology Studies: Dg Chest Port 1 View  Result Date: 10/11/2017 CLINICAL DATA:  Acute respiratory failure.  Hypoxia. EXAM: PORTABLE CHEST 1 VIEW COMPARISON:  10/10/2017. FINDINGS: Tracheostomy tube, feeding tube, NG tube, right PICC line in unchanged position. Heart size normal. Right base atelectatic changes and consolidation again noted. Atelectasis. No pleural effusion or pneumothorax. IMPRESSION: 1.  Lines and tubes stable position. 2. Right base atelectatic changes and consolidation again noted. Continued follow-up exam to demonstrate resolution. Electronically Signed   By: Marcello Moores  Register   On: 10/11/2017 10:06      Scheduled Meds: . albuterol  2.5 mg Nebulization Q4H  . busPIRone  10 mg Per Tube TID  . chlorhexidine gluconate (MEDLINE KIT)  15 mL Mouth Rinse BID  . Chlorhexidine Gluconate Cloth  6 each Topical Daily  . citalopram  20 mg Per Tube Daily  . clonazePAM  1 mg Per Tube Q8H  . enoxaparin (LOVENOX) injection  70 mg Subcutaneous Q12H  . gabapentin  300 mg Per Tube Q12H  . insulin aspart  0-9 Units Subcutaneous Q6H  . levothyroxine  75 mcg Per Tube QAC breakfast  . mouth rinse  15 mL Mouth Rinse 10 times per day  . metoCLOPramide (REGLAN) injection  10 mg Intravenous Q6H  . nicotine  21 mg Transdermal Daily  . oxyCODONE  5 mg Per Tube Q6H  . QUEtiapine  200 mg Per Tube QHS  . sodium chloride flush  10-40 mL Intracatheter Q12H  . sodium chloride flush  3 mL Intravenous Q12H  .  traZODone  100 mg Oral QHS  . valproic acid  500 mg Per Tube BID   Continuous Infusions: .  ceFAZolin (ANCEF) IV Stopped (10/12/17 1027)  . dextrose 5 % and 0.45% NaCl 75 mL/hr at 10/12/17 1500  . feeding supplement (VITAL AF 1.2 CAL) 1,000 mL (10/12/17 1614)  . TPN CYCLIC-ADULT (ION)       LOS: 20 days   Time spent: Total of 25 minutes spent with pt, greater than 50% of which was spent in discussion of  treatment, counseling and coordination of care   Chipper Oman, MD Pager: Text Page via [email or web address]   If 7PM-7AM, please contact night-coverage [email or web address] 10/12/2017, 4:26 PM   Note - This record has been created using [employer]. Chart creation errors have been sought, but may not always have been located. Such creation errors do not reflect on the standard of medical care.

## 2017-10-12 NOTE — Progress Notes (Signed)
Trach sutures removed, per order.  Pt tolerated procedure well with no apparent complications.

## 2017-10-12 NOTE — Progress Notes (Signed)
Inpatient Rehabilitation  Please see consult by Dr. Allena Katz for full details.  Note that patient went back on vent some last night.  Plan to follow at a distance to ensure over night vent weaning prior to meeting with patient and family about IP Rehab.  Plan to follow up again tomorrow.  Call if questions.   Charlane Ferretti., CCC/SLP Admission Coordinator  [facility] Inpatient Rehabilitation  Cell [phone]

## 2017-10-13 ENCOUNTER — Inpatient Hospital Stay (HOSPITAL_COMMUNITY): Payer: BLUE CROSS/BLUE SHIELD

## 2017-10-13 LAB — COMPREHENSIVE METABOLIC PANEL
ALK PHOS: 158 U/L — AB (ref 38–126)
ALT: 46 U/L — AB (ref 0–44)
AST: 26 U/L (ref 15–41)
Albumin: 2.5 g/dL — ABNORMAL LOW (ref 3.5–5.0)
Anion gap: 9 (ref 5–15)
BUN: 16 mg/dL (ref 6–20)
CALCIUM: 9.2 mg/dL (ref 8.9–10.3)
CO2: 26 mmol/L (ref 22–32)
CREATININE: 0.54 mg/dL — AB (ref 0.61–1.24)
Chloride: 99 mmol/L (ref 98–111)
GFR calc Af Amer: >60 mL/min (ref >60)
Glucose, Bld: 240 mg/dL — ABNORMAL HIGH (ref 70–99)
Potassium: 5.1 mmol/L (ref 3.5–5.1)
SODIUM: 134 mmol/L — AB (ref 135–145)
Total Bilirubin: 0.3 mg/dL (ref 0.3–1.2)
Total Protein: 6.9 g/dL (ref 6.5–8.1)

## 2017-10-13 LAB — CBC
HCT: 41 % (ref 39.0–52.0)
HEMOGLOBIN: 12.7 g/dL — AB (ref 13.0–17.0)
MCH: 28 pg (ref 26.0–34.0)
MCHC: 31 g/dL (ref 30.0–36.0)
MCV: 90.5 fL (ref 78.0–100.0)
Platelets: 384 10*3/uL (ref 150–400)
RBC: 4.53 MIL/uL (ref 4.22–5.81)
RDW: 14.2 % (ref 11.5–15.5)
WBC: 12.4 10*3/uL — AB (ref 4.0–10.5)

## 2017-10-13 LAB — MAGNESIUM: Magnesium: 2.1 mg/dL (ref 1.7–2.4)

## 2017-10-13 LAB — GLUCOSE, CAPILLARY
GLUCOSE-CAPILLARY: 139 mg/dL — AB (ref 70–99)
GLUCOSE-CAPILLARY: 124 mg/dL — AB (ref 70–99)
Glucose-Capillary: 116 mg/dL — ABNORMAL HIGH (ref 70–99)
Glucose-Capillary: 101 mg/dL — ABNORMAL HIGH (ref 70–99)
Glucose-Capillary: 110 mg/dL — ABNORMAL HIGH (ref 70–99)
Glucose-Capillary: 126 mg/dL — ABNORMAL HIGH (ref 70–99)

## 2017-10-13 LAB — PHOSPHORUS: Phosphorus: 4.4 mg/dL (ref 2.5–4.6)

## 2017-10-13 MED ORDER — TRAVASOL 10 % IV SOLN
INTRAVENOUS | Status: AC
  Administered 2017-10-13: 18:00:00 via INTRAVENOUS
  Filled 2017-10-13: qty 930

## 2017-10-13 MED ORDER — SODIUM CHLORIDE 0.9 % IV BOLUS
1000.0000 mL | Freq: Once | INTRAVENOUS | Status: AC
  Administered 2017-10-13: 1000 mL via INTRAVENOUS

## 2017-10-13 NOTE — Progress Notes (Signed)
Initial Nutrition Assessment  DOCUMENTATION CODES:   Severe malnutrition in context of chronic illness  INTERVENTION:   Recommend obtaining abdominal xray to confirm post-pyloric Cortrak placement.   Recommend resuming TF once post-pyloric placement confirmed and titrating as able  Continue TPN per Pharmacy  NUTRITION DIAGNOSIS:   Severe Malnutrition related to chronic illness(cardiomyopathy, substance abuse, chronic constipation/obstipation with megacolon) as evidenced by severe fat depletion, severe muscle depletion.  Being addressed via nutrition support  GOAL:   Patient will meet greater than or equal to 90% of their needs  Met  MONITOR:   TF tolerance, I & O's, Labs, Skin, Vent status, Diet advancement  REASON FOR ASSESSMENT:   Ventilator    ASSESSMENT:   27 yo male admitted 9/4 for megacolon and severe obstructive constipation with severely dilated colon and colitis. Noted pt admitted to [facility] at the end of August with constipation x 3 weeks with megacolon with medical therapy attempts, discharged on 9/2. Pt also with chest pain with bilateral PE on [place] includes polysubstance abuse, drug-induced cardiomyopathy, anxiety, chronic obstipation/constipation  Currently on trach collar Vital AF 1.2 @ 60 ml/hr via Cortrak (post-pyloric); TF currently on hold Continues with Cyclic TPN; TF +TPN meeting >100%. Discussed TPN and nutrition plan for [place]; unsure at this time how much of TF pt is actually absorbing, no signs of overfeeding, agree with plan to increase TPN at this time  NG output 1750 mL, Ileosotmy with 1650 mL, UOP 1350 mL Some concern that TF is coming out with NG output. RD observed contents in cannister from NG. Opaque milky yellow color. Possible that some TF may be mixed in but hard to tell.  Pt has been ambulating with PT; sitting in chair, listening to music and looking out window on visit today. Pt gave RD thumbs up   Labs: sodium  134, potassium 5.1 (trending up), phosphorus wdl, magesium wdl, CBGs 94-120 Meds: reglan, ss novolog, D5-1/2 NS at 75 ml/hr  Diet Order:   Diet Order    None      EDUCATION NEEDS:   Not appropriate for education at this time  Skin:  Skin Assessment: Skin Integrity Issues: Skin Integrity Issues:: Stage III Stage III: intragluteal (coccygeal) Incisions: abdomen  Last BM:  stool via ileostomy   Height:   Ht Readings from Last 1 Encounters:  09/27/17 5' 7.99" (1.727 m)    Weight:   Wt Readings from Last 1 Encounters:  10/13/17 66 kg    Ideal Body Weight:  70 kg  BMI:  Body mass index is 22.13 kg/m.  Estimated Nutritional Needs:   Kcal:  2200-2400 kcals   Protein:  138-152 g  Fluid:  >/= 2 L   Kerman Passey MS, RD, LDN, CNSC [phone] Pager  [phone] Weekend/On-Call Pager

## 2017-10-13 NOTE — Progress Notes (Signed)
Patient ID: Philip Thompson, male   DOB: 1990-03-28, 27 y.o.   MRN: [id number]    16 Days Post-Op  Subjective: No complaints except pain in his nose.   Objective: Vital signs in last 24 hours: Temp:  [98.5 F (36.9 C)-99.9 F (37.7 C)] 98.5 F (36.9 C) (09/26 0804) Pulse Rate:  [101-240] 140 (09/26 0725) Resp:  [19-36] 31 (09/26 0725) BP: (97-141)/(32-126) 112/65 (09/26 0500) SpO2:  [90 %-100 %] 94 % (09/26 0725) FiO2 (%):  [35 %] 35 % (09/26 0725) Weight:  [66 kg] 66 kg (09/26 0500) Last BM Date: 10/10/17  Intake/Output from previous day: 09/25 0701 - 09/26 0700 In: 3983.2 [I.V.:2638.3; NG/GT:745; IV Piggyback:599.9] Out: 4098 [Urine:1385; Emesis/NG output:1750; Stool:1650] Intake/Output this shift: No intake/output data recorded.  PE: Heart: SVT, tachy in 140s Lungs: some coarse breath sounds throughout Abd: soft, NT, ND, +BS, ileostomy with appropriate output.  NGT still with about 1700cc of output, some of this appears to look tan like TFs mixed with bilious output.  Lab Results:  Recent Labs    10/12/17 0500 10/13/17 0500  WBC 10.9* 12.4*  HGB 11.2* 12.7*  HCT 36.0* 41.0  PLT 305 384   BMET Recent Labs    10/12/17 0500 10/13/17 0500  NA 136 134*  K 4.5 5.1  CL 99 99  CO2 26 26  GLUCOSE 109* 240*  BUN 18 16  CREATININE 0.46* 0.54*  CALCIUM 8.8* 9.2   PT/INR No results for input(s): LABPROT, INR in the last 72 hours. CMP     Component Value Date/Time   NA 134 (L) 10/13/2017 0500   K 5.1 10/13/2017 0500   CL 99 10/13/2017 0500   CO2 26 10/13/2017 0500   GLUCOSE 240 (H) 10/13/2017 0500   BUN 16 10/13/2017 0500   CREATININE 0.54 (L) 10/13/2017 0500   CALCIUM 9.2 10/13/2017 0500   PROT 6.9 10/13/2017 0500   ALBUMIN 2.5 (L) 10/13/2017 0500   AST 26 10/13/2017 0500   ALT 46 (H) 10/13/2017 0500   ALKPHOS 158 (H) 10/13/2017 0500   BILITOT 0.3 10/13/2017 0500   GFRNONAA >60 10/13/2017 0500   GFRAA >60 10/13/2017 0500   Lipase     Component  Value Date/Time   LIPASE 37 09/21/2017 2237       Studies/Results: No results found.  Anti-infectives: Anti-infectives (From admission, onward)   Start     Dose/Rate Route Frequency Ordered Stop   10/09/17 1730  ceFAZolin (ANCEF) IVPB 2g/100 mL premix     2 g 200 mL/hr over 30 Minutes Intravenous Every 8 hours 10/09/17 1641 10/16/17 2359   10/08/17 0815  erythromycin ethylsuccinate (EES) 200 MG/5ML suspension 200 mg  Status:  Discontinued     200 mg Per Tube Every 6 hours 10/08/17 0807 10/09/17 1636   09/27/17 1000  piperacillin-tazobactam (ZOSYN) IVPB 3.375 g  Status:  Discontinued     3.375 g 12.5 mL/hr over 240 Minutes Intravenous Every 8 hours 09/27/17 0845 10/06/17 1504       Assessment/Plan S/P subtotal colectomy with ileostomy 9/10 Dr. Magnus Ivan - Gastric atony, continue NG to suction and post pyloric TF. Reglan added yesterday.  So far not much change in NGT output.  ? If some of the output is "backed up" TF given color PE - Lovenox Acute resp failure - on HTC again now, per CCM Tachycardia/SVT - per medicine PT/OT - rec CIR   LOS: 21 days    Letha Cape , [facility]  10/13/2017, 8:34 AM Pager: [phone]

## 2017-10-13 NOTE — Progress Notes (Addendum)
PHARMACY - ADULT TOTAL PARENTERAL NUTRITION CONSULT NOTE   Pharmacy Consult for TPN Indication: massive bowel resection  Patient Measurements: Height: 5' 7.99" (172.7 cm) Weight: 145 lb 8.1 oz (66 kg) IBW/kg (Calculated) : 68.38  TPN AdjBW (KG): 77.8 Body mass index is 22.13 kg/m. Usual Weight: 87 kg  Assessment:  [name] admitted on 9/4 with complete colonic inertia, megacolon and severe obstructive constipation. Pt indicates poor intake and weight loss PTA over 1 month time frame. 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 Was found to have a massively distended colon from the cecum all the way to distal rectum and pelvic rim.  No evidence of perforation. Pharmacy has been consulted to start TPN for severe malnutrition related to chronic illness (cardiomyopathy, substance abuse, chronic constipation/obstruction with megacolon) associated with severe muscle and fat depletion and edema.   GI: Ogilvie's syndrome - s/p colectomy and ileostomy 9/10. NPO (since admission) with poor oral intake PTA. TPN initiated 9/12. Pre-albumin improved at 19.3. JP drain now out. LBM (1500 ml ileostomy output/24h). CT abd neg 9/14 for new acute process. Post-pyloric feeding tube placed 9/23 - resumed Vital AF 1.2 on 9/23 - was at 69m/hr this morning (goal 80 ml/hr) - currently off as meds just given. NGT o/p ~1700 and what is in suction canister looks like TF - CCS wondering if this is "backed up" TF. Reglan added 9/25.  Wt 145 lb (large decrease since admission but stable over past few days)  Endo: hypothyroidism (TSH 3.1 on 9/23) - continue levothyroxine. CBGs well controlled. A.m. glucose 240 (?accuracy) since not in line with others. Insulin requirements in the past 24 hours: 0 units SSI Lytes: K trending up to 5.1, Na down slightly, other lytes wnl. Renal: SCr and BUN stable, UOP ~1385/24h; I/O -0.5L (Net -15.5L) Pulm: procedural intubation 9/10 - extubated 9/12;  re-intubated 9/13. Trached on 9/18. Had to be re-intubated briefly 9/24 pm Cards: bilateral PE noted on admission likely provoked by lower extremity DVT in the setting of pelvic vein compression - was on heparin drip (held for bloody JP drainage 9/14- s/p IVC filter 9/15 d/t inability to resume heparin). CT did not show evidence of RV strain. LVEF 55-60%. ST - HR 130-150s. Lovenox started 9/21. Hepatobil: ALT elevated but trending down, Tbili wnl, TG wnl Neuro: history of MDD, substance abuse disorder, ADD - Medications PTA: buspirone, citalopram, divalproex, gabapentin (agitation), quetiapine, and trazodone all resumed. Pt also on scheduled Klonopin and oxycodone. Experiencing some ICU delirium and abusive to staff. Precedex off. ID: s/p zosyn for possible aspiration; cefazolin added 9/22 (plan for 7 days), WBC 12.4, Afebrile 9/18 BAL -20K Ecoli, 30K St.aureus  TPN Access: PICC triple lumen placed 9/11 TPN start date: 9/12 Nutritional Goals (per RD recommendation on 9/23): KCal: 2200-2400 Protein: 138-152 g Fluid: > 2 L  Current Nutrition: Vital AF at 60 mL/hr  (with good amount of NGT o/p) + TPN  Plan:  - Will increase cyclic TPN slightly to 10865mL over 12 hrs - Initiate at 68 mL/hr x 1 hour then increase to 136 mL/hr x12 hours, then reduce to 68 mL/hr x1 hour and stop.  - TPN provides 93 gm protein and 1437 kcal. Vital AF 1.2 @ 51mhr provides 1440 kcal and 90 gm protein. (TPN + TF provides >100% of patients needs but question how much of the TF patient actually receiving as it looks like some of NG o/p may be TF). -Consider reimaging to  verify tube is still post-pyloric   -Will f/u tolerance of TF and NGT o/p and ability to d/c TPN -Electrolytes in TPN: Decrease K in TPN -MVI and trace elements in TPN -Vit C 500 mg d/t concerns for severe deficiency -Pepcid 40 IV in TPN -Continue sensitive SSI q6h for now and monitor   -Monitor TPN labs  Sherlon Handing, PharmD, BCPS Clinical  pharmacist  **Pharmacist phone directory can now be found on [email or web address] ([email or web address]).  Listed under [place]. 10/13/2017 10:20 AM

## 2017-10-13 NOTE — Progress Notes (Signed)
Physical Therapy Treatment Patient Details Name: Philip Thompson MRN: [id number] DOB: 10-24-1990 Today's Date: 10/13/2017    History of Present Illness 27 y/o male admitted with colonic inertia/megacolon secondary to constipation and bilateral pulmonary emboli and DVT due to pelvic vein obstruction.  He had acute aspiration pneumonia on 9/10 prior to surgery in the setting of nausea/vomiting.  He went to the OR on 9/10 for total colectomy and ileostomy.  Extubated 9/12, Reintubated 9/13. 9/18 Trached and Cortrak placed PMH Anxiety/depression, hx of substance abuse disorder    PT Comments    Pt very motivated to work with PT today. Pt is making good progress towards his goals, however continues to be limited in safe mobility by decreased safety awareness, as well as decreased strength and endurance. Pt requires modA for bed mobility, minAx2 for transfers and modA to min guard A for ambulation of 100 feet with RW. Pt able to go to outside landing for some fresh air and pt very appreciative. PT will continue to follow acutely to progress mobility.    Follow Up Recommendations  CIR     Equipment Recommendations  Other (comment)       Precautions / Restrictions Precautions Precautions: Fall Precaution Comments: watch HR Restrictions Weight Bearing Restrictions: No    Mobility  Bed Mobility Overal bed mobility: Needs Assistance Bed Mobility: Supine to Sit     Supine to sit: Mod assist     General bed mobility comments: modA for trunk to upright  Transfers Overall transfer level: Needs assistance Equipment used: Rolling walker (2 wheeled) Transfers: Sit to/from Stand Sit to Stand: Min assist;+2 safety/equipment         General transfer comment: verbal cues for hand placement, performed from bed and from recliner   Ambulation/Gait Ambulation/Gait assistance: +2 physical assistance;Min assist;Min guard Gait Distance (Feet): 100 Feet(1x 100, 1 x 50) Assistive device: Rolling  walker (2 wheeled) Gait Pattern/deviations: Step-through pattern;Decreased stride length;Trunk flexed;Narrow base of support Gait velocity: slowed Gait velocity interpretation: <1.8 ft/sec, indicate of risk for recurrent falls General Gait Details: minAx2 progressing to min guard and back to minA with fatigue, vc for wider BoS, proximity to RW, and safety awareness       Balance Overall balance assessment: Needs assistance   Sitting balance-Leahy Scale: Fair       Standing balance-Leahy Scale: Poor Standing balance comment: requires B UE support                             Cognition Arousal/Alertness: Awake/alert Behavior During Therapy: Impulsive Overall Cognitive Status: Impaired/Different from baseline Area of Impairment: Following commands;Problem solving                   Current Attention Level: Selective   Following Commands: Follows one step commands with increased time Safety/Judgement: Decreased awareness of safety;Decreased awareness of deficits Awareness: Emergent Problem Solving: Slow processing;Difficulty sequencing;Requires tactile cues;Requires verbal cues           General Comments General comments (skin integrity, edema, etc.): supplemental O2 on trach collar, FiO2 40%O2 8L O2 pt maintained SaO2 >90%O2 throughout session. Pt continues to be tachycardic with mobility max HR 166 bpm , pt is asymptomatic       Pertinent Vitals/Pain Pain Assessment: 0-10 Pain Score: 8  Pain Location: back Pain Descriptors / Indicators: Aching;Throbbing Pain Intervention(s): Limited activity within patient's tolerance;Monitored during session;Repositioned;Utilized relaxation techniques  PT Goals (current goals can now be found in the care plan section) Acute Rehab PT Goals Patient Stated Goal: to go outside PT Goal Formulation: With patient Time For Goal Achievement: 10/21/17 Potential to Achieve Goals: Good Progress towards PT goals:  Progressing toward goals    Frequency    Min 3X/week      PT Plan Current plan remains appropriate       AM-PAC PT "6 Clicks" Daily Activity  Outcome Measure  Difficulty turning over in bed (including adjusting bedclothes, sheets and blankets)?: Unable Difficulty moving from lying on back to sitting on the side of the bed? : Unable Difficulty sitting down on and standing up from a chair with arms (e.g., wheelchair, bedside commode, etc,.)?: Unable Help needed moving to and from a bed to chair (including a wheelchair)?: A Little Help needed walking in hospital room?: A Little Help needed climbing 3-5 steps with a railing? : A Lot 6 Click Score: 11    End of Session Equipment Utilized During Treatment: Oxygen;Gait belt Activity Tolerance: Patient tolerated treatment well Patient left: in chair;with call bell/phone within reach;with chair alarm set;with family/visitor present Nurse Communication: Mobility status PT Visit Diagnosis: Unsteadiness on feet (R26.81);Other abnormalities of gait and mobility (R26.89);Muscle weakness (generalized) (M62.81);Difficulty in walking, not elsewhere classified (R26.2);Pain Pain - part of body: (back )     Time: 2244-9753 PT Time Calculation (min) (ACUTE ONLY): 42 min  Charges:  $Gait Training: 23-37 mins $Therapeutic Activity: 8-22 mins                     Mikeyla Music B. Beverely Risen PT, DPT Acute Rehabilitation Services Pager [phone] Office [phone]    Philip Thompson 10/13/2017, 5:20 PM

## 2017-10-13 NOTE — Progress Notes (Signed)
PROGRESS NOTE [facility]   Philip Thompson   VFI:[id number] DOB: July 30, 1990  DOA: 09/21/2017 PCP: [name], MD   Brief Narrative:  Philip Thompson 27 year old male with medical history significant for polysubstance abuse, major depression, anxiety chronic constipation and hypothyroidism presented to the emergency department complaining of severe abdominal pain, distention associated with nausea and vomiting.  Upon ED evaluation patient found to have colonic inertia/megacolon secondary to constipation along with acute respiratory failure where he was found to have pulmonary embolism.  Patient was admitted with working diagnosis of acute respiratory failure with hypoxia and possible Ogilvie syndrome.  Significant events: 9/10 -total colectomy and ileostomy.  Was found to have a massive distended colon from cecum all the way to the distal rectum and pelvic ring. JP drain  9/10 intubation, unable to extubate 9/11 PICC line 9/15 place retrievable IVC filter due to JP drain bleeding while on anti-coagulation 9/18 Shiley trach cuffed 9/22 started on Precedex for agitation, successfully weaned off on 9/23. 9/23 JP drain out   Subjective: Patient seen and examined, has no complaints this morning.  Slept well last night.  Still with high output NG tube light color.   Assessment & Plan:   Principal Problem:   Anxiety and depression Active Problems:   MDD (major depressive disorder), recurrent severe, without psychosis (Philip Thompson)   Leg edema   Fecal impaction (HCC)   Ogilvie's syndrome   Acute respiratory failure with hypoxia (HCC)   Acute pulmonary embolism without acute cor pulmonale (HCC)   Hypothyroidism   Colon distention   Preoperative cardiovascular examination   Protein-calorie malnutrition, severe   Acute respiratory failure with hypoxemia (HCC)   Pressure injury of skin   Protein calorie malnutrition ([place])   Tracheostomy care ([place])   Tracheostomy in place [facility])  History of traumatic brain injury   Anoxic brain injury ([place])   Chronic pain syndrome   Sinus tachycardia   Tachypnea   Leukocytosis  Acute respiratory failure with hypoxia, felt to be secondary to PE and mucous plugging Tolerating well ATC, off the vent last night.  Continue PSM as able.  Pulmonary hygiene.  Trach collar per pulmonary team.  Out of bed as tolerated.  Ogilvie syndrome status post colectomy and ileostomy NG tube with high output, will obtain chest x-ray to make sure that postpyloric CorPak is in the right place.  High output on colostomy/ileostomy bag.  Continue to monitor BMP closely patient at risk of alkalosis.  Continue management per general surgery  Provoke PE/DVT from bowel obstruction/abdominal compartment syndrome Initially was on anticoagulation which was held due to TPA bleed on 9/14.  Status post retrievable filter on 9/15.  CT with no evidence of right ventricular strain.  Currently on full dose Lovenox.  IR recommending to retrieve filter in 4 weeks (~10/15).  Monitor for signs of bleeding, monitor CBC.  Sepsis due to staph aureus and E. coli Met sepsis criteria with tachycardia, leukocytosis and fever. Positive BAL for staph of renal calculi.  Right lower lobe collapse/atelectasis on chest x-ray.  Currently on Ancef day 7, will continue for 3 more days.  Protein calorie malnutrition Patient currently on postpyloric tube feeding and TPN.  We will continue to monitor and discontinue TPN if feedings are tolerated.  Anxiety/depression Patient on multiple psych medications, psychiatry has been consulted and medications adjusted.  Currently on BuSpar, Klonopin, Celexa, gabapentin, Seroquel, trazodone and valproic acid.  Will need to follow-up psych as outpatient.   Hypothyroidism Stable continue Synthroid.  DVT prophylaxis: Full dose Lovenox Code Status: Full code Family Communication: Mother at bedside Disposition Plan: CIR when medically  stable  Consultants:   General surgery  IR  PCCM  GI   Psychiatry   Antimicrobials: Anti-infectives (From admission, onward)   Start     Dose/Rate Route Frequency Ordered Stop   10/09/17 1730  ceFAZolin (ANCEF) IVPB 2g/100 mL premix     2 g 200 mL/hr over 30 Minutes Intravenous Every 8 hours 10/09/17 1641 10/16/17 2359   10/08/17 0815  erythromycin ethylsuccinate (EES) 200 MG/5ML suspension 200 mg  Status:  Discontinued     200 mg Per Tube Every 6 hours 10/08/17 0807 10/09/17 1636   09/27/17 1000  piperacillin-tazobactam (ZOSYN) IVPB 3.375 g  Status:  Discontinued     3.375 g 12.5 mL/hr over 240 Minutes Intravenous Every 8 hours 09/27/17 0845 10/06/17 1504      Objective: Vitals:   10/13/17 1200 10/13/17 1230 10/13/17 1300 10/13/17 1400  BP: 103/65 138/88 108/67 112/71  Pulse: (!) 138 (!) 127 (!) 135 (!) 127  Resp: (!) 28 (!) 32 12 (!) 26  Temp:      TempSrc:      SpO2: 94% 95% 94% 96%  Weight:      Height:        Intake/Output Summary (Last 24 hours) at 10/13/2017 1528 Last data filed at 10/13/2017 1454 Gross per 24 hour  Intake 3646.4 ml  Output 4260 ml  Net -613.6 ml   Filed Weights   10/11/17 0500 10/12/17 0332 10/13/17 0500  Weight: 71.2 kg 67.3 kg 66 kg    Examination:  General: Pt is alert, awake, not in acute distress HEENT: Trach in place, no secretions noted, Cortrak tube in place, NG tube in place Cardiovascular: RRR, S1/S2 +, no rubs, no gallops Respiratory: Good air entry, mild diffuse rhonchi no wheezing Abdominal: Colostomy/ileostomy bag in place, high output, mild left lower quadrant tenderness. Extremities: no edema Skin: No rashes Neuro: Nonfocal  Data Reviewed: I have personally reviewed following labs and imaging studies  CBC: Recent Labs  Lab 10/09/17 0448 10/10/17 0343 10/11/17 0557 10/12/17 0500 10/13/17 0500  WBC 25.7* 18.2* 13.0* 10.9* 12.4*  NEUTROABS  --   --  9.6*  --   --   HGB 11.9* 11.4* 11.1* 11.2* 12.7*  HCT  36.8* 36.0* 35.8* 36.0* 41.0  MCV 89.5 90.7 90.9 90.0 90.5  PLT 314 269 318 305 867   Basic Metabolic Panel: Recent Labs  Lab 10/08/17 0435 10/09/17 0448 10/10/17 0343 10/11/17 0557 10/12/17 0500 10/13/17 0500  NA 135 139 137 136 136 134*  K 4.0 3.4* 3.7 4.3 4.5 5.1  CL 97* 102 103 102 99 99  CO2 _0 GLUCOSE 117* 83 127* 106* 109* 240*  BUN _1 CREATININE 0.48* 0.43* 0.50* 0.46* 0.46* 0.54*  CALCIUM 8.9 9.1 8.8* 8.9 8.8* 9.2  MG 2.0  --   --  1.8 1.9 2.1  PHOS 3.7  --   --  3.3 3.5 4.4   GFR: Estimated Creatinine Clearance: 129.5 mL/min (A) (by C-G formula based on SCr of 0.54 mg/dL (L)). Liver Function Tests: Recent Labs  Lab 10/08/17 0435 10/10/17 0343 10/11/17 0557 10/13/17 0500  AST  --  _2 ALT  --  63* 50* 46*  ALKPHOS  --  98 116 158*  BILITOT  --  0.4 0.4 0.3  PROT  --  6.4* 6.3* 6.9  ALBUMIN 2.3* 2.2* 2.3* 2.5*   No results for input(s): LIPASE, AMYLASE in the last 168 hours. No results for input(s): AMMONIA in the last 168 hours. Coagulation Profile: No results for input(s): INR, PROTIME in the last 168 hours. Cardiac Enzymes: No results for input(s): CKTOTAL, CKMB, CKMBINDEX, TROPONINI in the last 168 hours. BNP (last 3 results) No results for input(s): PROBNP in the last 8760 hours. HbA1C: No results for input(s): HGBA1C in the last 72 hours. CBG: Recent Labs  Lab 10/12/17 2337 10/13/17 0317 10/13/17 0531 10/13/17 0731 10/13/17 1157  GLUCAP 120* 116* 101* 110* 126*   Lipid Profile: Recent Labs    10/11/17 0558  TRIG 45   Thyroid Function Tests: No results for input(s): TSH, T4TOTAL, FREET4, T3FREE, THYROIDAB in the last 72 hours. Anemia Panel: No results for input(s): VITAMINB12, FOLATE, FERRITIN, TIBC, IRON, RETICCTPCT in the last 72 hours. Sepsis Labs: No results for input(s): PROCALCITON, LATICACIDVEN in the last 168 hours.  Recent Results (from the past 240 hour(s))  Culture, bal-quantitative      Status: Abnormal (Preliminary result)   Collection Time: 10/05/17  3:40 PM  Result Value Ref Range Status   Specimen Description BRONCHIAL ALVEOLAR LAVAGE  Final   Special Requests NONE  Final   Gram Stain   Final    FEW WBC PRESENT, PREDOMINANTLY PMN RARE GRAM POSITIVE COCCI    Culture (A)  Final    30,000 COLONIES/mL STAPHYLOCOCCUS AUREUS 20,000 COLONIES/mL ESCHERICHIA COLI ORGANISM 1 Sent to [place] for further susceptibility testing. Performed at [facility], [place] [address], [place], [place] [address]    Report Status PENDING  Incomplete   Organism ID, Bacteria ESCHERICHIA COLI (A)  Final      Susceptibility   Escherichia coli - MIC*    AMPICILLIN <=2 SENSITIVE Sensitive     CEFAZOLIN <=4 SENSITIVE Sensitive     CEFEPIME <=1 SENSITIVE Sensitive     CEFTAZIDIME <=1 SENSITIVE Sensitive     CEFTRIAXONE <=1 SENSITIVE Sensitive     CIPROFLOXACIN <=0.25 SENSITIVE Sensitive     GENTAMICIN <=1 SENSITIVE Sensitive     IMIPENEM <=0.25 SENSITIVE Sensitive     TRIMETH/SULFA <=20 SENSITIVE Sensitive     AMPICILLIN/SULBACTAM <=2 SENSITIVE Sensitive     PIP/TAZO <=4 SENSITIVE Sensitive     Extended ESBL NEGATIVE Sensitive     * 20,000 COLONIES/mL ESCHERICHIA COLI  Susceptibility, Aer + Anaerob     Status: None   Collection Time: 10/05/17  4:00 PM  Result Value Ref Range Status   Suscept, Aer + Anaerob Preliminary report  Final    Comment: (NOTE) Performed At: [facility] [address] [place], [place] [id number] Rush Farmer MD GH:[id number]    Source of Sample BRONCHIAL ALVEOLAR LAVAGE  Final    Comment: Performed at [facility], [place] [address] [place], [place] [address]  Susceptibility Result     Status: None (Preliminary result)   Collection Time: 10/05/17  4:00 PM  Result Value Ref Range Status   Suscept Result 1 Staphylococcus aureus  Final    Comment: (NOTE) Identification performed by account, not confirmed by this laboratory. Performed At:  [facility] [place], [place] [id number] Rush Farmer MD EN:[id number]    Antimicrobial Suscept PENDING  Incomplete      Radiology Studies: No results found.    Scheduled Meds: . albuterol  2.5 mg Nebulization Q4H  . busPIRone  10 mg Per Tube BID  .  chlorhexidine gluconate (MEDLINE KIT)  15 mL Mouth Rinse BID  . Chlorhexidine Gluconate Cloth  6 each Topical Daily  . citalopram  20 mg Per Tube Daily  . clonazePAM  1 mg Per Tube Q8H  . enoxaparin (LOVENOX) injection  70 mg Subcutaneous Q12H  . gabapentin  300 mg Per Tube Q12H  . insulin aspart  0-9 Units Subcutaneous Q6H  . levothyroxine  75 mcg Per Tube QAC breakfast  . mouth rinse  15 mL Mouth Rinse 10 times per day  . metoCLOPramide (REGLAN) injection  10 mg Intravenous Q6H  . nicotine  21 mg Transdermal Daily  . oxyCODONE  5 mg Per Tube Q6H  . QUEtiapine  200 mg Per Tube QHS  . sodium chloride flush  10-40 mL Intracatheter Q12H  . sodium chloride flush  3 mL Intravenous Q12H  . traZODone  100 mg Oral QHS  . valproic acid  500 mg Per Tube BID   Continuous Infusions: .  ceFAZolin (ANCEF) IV Stopped (10/13/17 1032)  . dextrose 5 % and 0.45% NaCl 75 mL/hr at 10/13/17 1300  . feeding supplement (VITAL AF 1.2 CAL) 80 mL/hr at 10/12/17 2200  . TPN CYCLIC-ADULT (ION)       LOS: 21 days   Time spent: Total of 25 minutes spent with pt, greater than 50% of which was spent in discussion of  treatment, counseling and coordination of care   Chipper Oman, MD Pager: Text Page via [email or web address]   If 7PM-7AM, please contact night-coverage [email or web address] 10/13/2017, 3:28 PM   Note - This record has been created using [employer]. Chart creation errors have been sought, but may not always have been located. Such creation errors do not reflect on the standard of medical care.

## 2017-10-13 NOTE — Progress Notes (Addendum)
eLink Physician-Brief Progress Note Patient Name: Philip Thompson DOB: 1990-04-01 MRN: [id number]   Date of Service  10/13/2017  HPI/Events of Note  Persistent tachycardia of unclear etiology. Recent TSH within normal limits. No evidence of bleeding. Patient has a history of substance abuse but has been in the hospital for too long to be withdrawing. Volume status approx even by input/output but has been on the dry side recently. 12 lead EKG clearly sinus tachycardia.  eICU Interventions  1000 ml iv fluid bolus then will re-evaluate if no improvement in ST.        Philip Thompson 10/13/2017, 11:04 PM

## 2017-10-14 ENCOUNTER — Inpatient Hospital Stay (HOSPITAL_COMMUNITY): Payer: BLUE CROSS/BLUE SHIELD

## 2017-10-14 DIAGNOSIS — F329 Major depressive disorder, single episode, unspecified: Secondary | ICD-10-CM

## 2017-10-14 DIAGNOSIS — F419 Anxiety disorder, unspecified: Secondary | ICD-10-CM

## 2017-10-14 LAB — CBC
HEMATOCRIT: 38.4 % — AB (ref 39.0–52.0)
HEMOGLOBIN: 12 g/dL — AB (ref 13.0–17.0)
MCH: 28.4 pg (ref 26.0–34.0)
MCHC: 31.3 g/dL (ref 30.0–36.0)
MCV: 90.8 fL (ref 78.0–100.0)
PLATELETS: 317 10*3/uL (ref 150–400)
RBC: 4.23 MIL/uL (ref 4.22–5.81)
RDW: 14.4 % (ref 11.5–15.5)
WBC: 12.2 10*3/uL — AB (ref 4.0–10.5)

## 2017-10-14 LAB — GLUCOSE, CAPILLARY
Glucose-Capillary: 104 mg/dL — ABNORMAL HIGH (ref 70–99)
Glucose-Capillary: 105 mg/dL — ABNORMAL HIGH (ref 70–99)
Glucose-Capillary: 108 mg/dL — ABNORMAL HIGH (ref 70–99)
Glucose-Capillary: 125 mg/dL — ABNORMAL HIGH (ref 70–99)
Glucose-Capillary: 93 mg/dL (ref 70–99)
Glucose-Capillary: 96 mg/dL (ref 70–99)
Glucose-Capillary: 98 mg/dL (ref 70–99)

## 2017-10-14 LAB — BASIC METABOLIC PANEL WITH GFR
Anion gap: 10 (ref 5–15)
BUN: 16 mg/dL (ref 6–20)
CO2: 26 mmol/L (ref 22–32)
Calcium: 9.3 mg/dL (ref 8.9–10.3)
Chloride: 100 mmol/L (ref 98–111)
Creatinine, Ser: 0.56 mg/dL — ABNORMAL LOW (ref 0.61–1.24)
GFR calc Af Amer: >60 mL/min
GFR calc non Af Amer: >60 mL/min
Glucose, Bld: 151 mg/dL — ABNORMAL HIGH (ref 70–99)
Potassium: 4.4 mmol/L (ref 3.5–5.1)
Sodium: 136 mmol/L (ref 135–145)

## 2017-10-14 MED ORDER — HYDROXYZINE HCL 10 MG/5ML PO SYRP
10.0000 mg | ORAL_SOLUTION | Freq: Three times a day (TID) | ORAL | Status: DC | PRN
  Administered 2017-10-14 – 2017-10-20 (×6): 10 mg
  Filled 2017-10-14 (×9): qty 5

## 2017-10-14 MED ORDER — ALBUTEROL SULFATE (2.5 MG/3ML) 0.083% IN NEBU
2.5000 mg | INHALATION_SOLUTION | Freq: Three times a day (TID) | RESPIRATORY_TRACT | Status: DC
  Administered 2017-10-14: 2.5 mg via RESPIRATORY_TRACT

## 2017-10-14 MED ORDER — LEVALBUTEROL HCL 0.63 MG/3ML IN NEBU
0.6300 mg | INHALATION_SOLUTION | Freq: Three times a day (TID) | RESPIRATORY_TRACT | Status: DC
  Administered 2017-10-14 – 2017-10-16 (×6): 0.63 mg via RESPIRATORY_TRACT
  Filled 2017-10-14 (×6): qty 3

## 2017-10-14 MED ORDER — ALBUTEROL SULFATE (2.5 MG/3ML) 0.083% IN NEBU
2.5000 mg | INHALATION_SOLUTION | RESPIRATORY_TRACT | Status: DC | PRN
  Filled 2017-10-14: qty 3

## 2017-10-14 MED ORDER — LEVALBUTEROL HCL 0.63 MG/3ML IN NEBU
0.6300 mg | INHALATION_SOLUTION | RESPIRATORY_TRACT | Status: DC | PRN
  Administered 2017-10-15 – 2017-10-18 (×3): 0.63 mg via RESPIRATORY_TRACT
  Filled 2017-10-14 (×4): qty 3

## 2017-10-14 MED ORDER — FREE WATER
250.0000 mL | Freq: Four times a day (QID) | Status: DC
  Administered 2017-10-14 – 2017-10-18 (×14): 250 mL

## 2017-10-14 MED ORDER — CHLORHEXIDINE GLUCONATE 0.12 % MT SOLN
15.0000 mL | Freq: Two times a day (BID) | OROMUCOSAL | Status: DC
  Administered 2017-10-15 – 2017-10-21 (×12): 15 mL via OROMUCOSAL
  Filled 2017-10-14 (×9): qty 15

## 2017-10-14 MED ORDER — FAMOTIDINE 40 MG/5ML PO SUSR
20.0000 mg | Freq: Two times a day (BID) | ORAL | Status: DC
  Administered 2017-10-14 – 2017-10-21 (×12): 20 mg
  Filled 2017-10-14 (×13): qty 2.5

## 2017-10-14 MED ORDER — SODIUM CHLORIDE 0.9 % IV BOLUS
1000.0000 mL | Freq: Once | INTRAVENOUS | Status: AC
  Administered 2017-10-14: 1000 mL via INTRAVENOUS

## 2017-10-14 MED ORDER — ORAL CARE MOUTH RINSE
15.0000 mL | Freq: Two times a day (BID) | OROMUCOSAL | Status: DC
  Administered 2017-10-15 – 2017-10-21 (×12): 15 mL via OROMUCOSAL

## 2017-10-14 NOTE — Plan of Care (Signed)
  Problem: Health Behavior/Discharge Planning: Goal: Ability to manage health-related needs will improve Outcome: Progressing   Problem: Clinical Measurements: Goal: Ability to maintain clinical measurements within normal limits will improve Outcome: Progressing Goal: Will remain free from infection Outcome: Progressing Goal: Diagnostic test results will improve Outcome: Progressing Goal: Respiratory complications will improve Outcome: Progressing Note:  Stable trach collar Goal: Cardiovascular complication will be avoided Outcome: Progressing Note:  HR improved, now 120s.    Problem: Activity: Goal: Risk for activity intolerance will decrease Outcome: Progressing   Problem: Nutrition: Goal: Adequate nutrition will be maintained Outcome: Progressing Note:  Plan for PEG placement, on tube feeds, TPN stopped today   Problem: Coping: Goal: Level of anxiety will decrease Outcome: Progressing Note:  Improved with medication, see MAR

## 2017-10-14 NOTE — Progress Notes (Signed)
Nutrition Follow-up  DOCUMENTATION CODES:   Severe malnutrition in context of chronic illness  INTERVENTION:   Tube Feeding:  Vital AF 1.2 @ 70 ml/hr, goal rate of 80 ml/hr Goal rate provides 2304 kcals, 144 g of protein and 1555 mL of free water  Add free water flushes: 250 mL q 6 hours; Total free water: 2555 mL/day  Need to keep close eye on electrolytes, fluid status given high NG output, ileostomy output and now off TPN, D5 infusion stopped   NUTRITION DIAGNOSIS:   Severe Malnutrition related to chronic illness(cardiomyopathy, substance abuse, chronic constipation/obstipation with megacolon) as evidenced by severe fat depletion, severe muscle depletion.  Being addressed via nutrition support  GOAL:   Patient will meet greater than or equal to 90% of their needs  Met  MONITOR:   TF tolerance, I & O's, Labs, Skin, Vent status, Diet advancement  REASON FOR ASSESSMENT:   Ventilator    ASSESSMENT:   27 yo male admitted 9/4 for megacolon and severe obstructive constipation with severely dilated colon and colitis. Noted pt admitted to [facility] at the end of August with constipation x 3 weeks with megacolon with medical therapy attempts, discharged on 9/2. Pt also with chest pain with bilateral PE on [place] includes polysubstance abuse, drug-induced cardiomyopathy, anxiety, chronic obstipation/constipation  9/10 Total colectomy with ileostomy, Vent 9/11 PICC line inserted 9/12 TPN initiated, Extubated 9/13 Re-Intubated 9/16 Trickle feedings started 9/18 Trach placed, Cortrak  9/21 TPN re-started, NG tube for decompression, TF held 9/23 Diagnostic Radiology advanced Cortrak tube to post-pyloric position;  JP drain removed 9/26 Abdominal xray confirms Cortrak tube in transverse duodenum, NG tube in stomach 9/27 TPN discontinued, TF to goal rate today  [facility]waiting [facility] Noted plan for G-J tube, IR has been consulted Vital AF 1.2 @ 70 ml/hr via  Cortrak tube TPN discontinued  Output: NG: 2.6 L, Ileostomy: 575 mL, UOP: 1235 mL  Weight down to 64.5 kg; most of the weight loss attributed to removal of colon (measuring 45 pounds) and significant fluid loss. Pt was severely malnourished on admission with muscle wasting and fat loss on admission, unsure if pt has lost any more this admission. Exam remains severe. Now that pt off TPN, will need to keep close eye on weight trend and adjust TF recommendations if needed  Labs: no BMP today Meds: D5-1/2 NS @ KVO, reglan  Diet Order:   Diet Order            Diet NPO time specified Except for: Ice Chips  Diet effective now              EDUCATION NEEDS:   Not appropriate for education at this time  Skin:  Skin Assessment: Skin Integrity Issues: Skin Integrity Issues:: Stage III Stage III: intragluteal (coccygeal) Incisions: abdomen  Last BM:  stool via ileostomy   Height:   Ht Readings from Last 1 Encounters:  09/27/17 5' 7.99" (1.727 m)    Weight:   Wt Readings from Last 1 Encounters:  10/14/17 64.5 kg    Ideal Body Weight:  70 kg  BMI:  Body mass index is 21.63 kg/m.  Estimated Nutritional Needs:   Kcal:  2200-2400 kcals   Protein:  138-152 g  Fluid:  >/= 2 L   Kerman Passey MS, RD, LDN, CNSC [phone] Pager  [phone] Weekend/On-Call Pager

## 2017-10-14 NOTE — Plan of Care (Signed)
  Problem: Health Behavior/Discharge Planning: Goal: Ability to manage health-related needs will improve Outcome: Progressing   Problem: Clinical Measurements: Goal: Ability to maintain clinical measurements within normal limits will improve Outcome: Progressing Goal: Will remain free from infection Outcome: Progressing Goal: Diagnostic test results will improve Outcome: Progressing Goal: Respiratory complications will improve Outcome: Progressing Note:  Remains on trach collar, no vent needs at this time. Goal: Cardiovascular complication will be avoided Outcome: Progressing Note:  HR up to 150s sustaining, MD aware, fluid bolus ordered, will continue to assess.   Problem: Activity: Goal: Risk for activity intolerance will decrease Outcome: Progressing Note:  Up to chair and walked with PT   Problem: Nutrition: Goal: Adequate nutrition will be maintained Outcome: Progressing Note:  TPN and tube feedings infusing   Problem: Coping: Goal: Level of anxiety will decrease Outcome: Progressing Note:  Sleeping at this time, complains of anxiety when awake.

## 2017-10-14 NOTE — Progress Notes (Addendum)
[facility] TEAM 1 - Stepdown/ICU TEAM  Philip Thompson  FIE:[id number] DOB: Dec 18, 1990 DOA: 09/21/2017 PCP: [name], MD    Brief Narrative:  27 year old male with a hx of polysubstance abuse, major depression, anxiety, chronic constipation, and hypothyroidism who presented to the ED w/ severe abdominal pain and distention with nausea and vomiting.  In the ED he was found to have colonic inertia/megacolon secondary to constipation, along with a pulmonary embolism.   Significant Events: 9/04 admit 9/10 total colectomy and ileostomy > massive distended colon from cecum all the way to the distal rectum and pelvic ring  9/10 intubation, unable to extubate 9/11 PICC line 9/15 placed retrievable IVC filter due to JP drain bleeding while on anti-coagulation 9/18 Shiley trach cuffed 9/22 started on Precedex for agitation, successfully weaned off on 9/23 9/23 JP drain out   Subjective: Alert and interactive.  Reports worsening anxiety.  Is tachycardic. No resp distress.  Has some low back pain. Denies abdom pain, nausea, vomiting.    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 Coli in BAL To complete 3 more days of abx tx   Protein calorie malnutrition on postpyloric tube feeding and TPN  Anxiety/depression - Hx substance abuse on multiple psych meds - Psychiatry consulted and meds  adjusted  Hypothyroidism continue Synthroid  DVT prophylaxis: lovenox  Code Status: FULL CODE Family Communication: no family present at time of exam  Disposition Plan: awaiting CIR bed - medically ready for CIR   Consultants:   General surgery  IR  PCCM  GI   Psychiatry  Antimicrobials:  Cefazikub 9/22 >   Objective: Blood pressure (!) 117/51, pulse (!) 126, temperature 98.4 F (36.9 C), temperature source Oral, resp. rate (!) 30, height 5' 7.99" (1.727 m), weight 64.5 kg, SpO2 97 %.  Intake/Output Summary (Last 24 hours) at 10/14/2017 1025 Last data filed at 10/14/2017 0900 Gross per 24 hour  Intake 6061.89 ml  Output 4410 ml  Net 1651.89 ml   Filed Weights   10/12/17 0332 10/13/17 0500 10/14/17 0500  Weight: 67.3 kg 66 kg 64.5 kg    Examination: General: No acute respiratory distress Lungs: Clear to auscultation bilaterally without wheezes or crackles Cardiovascular: Regular rate and rhythm without murmur gallop or rub normal S1 and S2 Abdomen: Nontender, nondistended, soft, bowel sounds positive, well healed midline abdom incision - R LQ ostomy in place  Extremities: No significant cyanosis, clubbing, or edema bilateral lower extremities  CBC: Recent Labs  Lab 10/11/17 0557 10/12/17 0500 10/13/17 0500 10/14/17 0415  WBC 13.0* 10.9* 12.4* 12.2*  NEUTROABS 9.6*  --   --   --   HGB 11.1* 11.2* 12.7* 12.0*  HCT 35.8* 36.0* 41.0 38.4*  MCV 90.9 90.0 90.5 90.8  PLT 318 305 384 166   Basic Metabolic Panel: Recent Labs  Lab 10/11/17 0557 10/12/17 0500 10/13/17 0500  NA 136 136 134*  K 4.3  4.5 5.1  CL 102 99 99  CO2 _0 GLUCOSE 106* 109* 240*  BUN _1 CREATININE 0.46* 0.46* 0.54*  CALCIUM 8.9 8.8* 9.2  MG 1.8 1.9 2.1  PHOS 3.3 3.5 4.4   GFR: Estimated Creatinine Clearance: 126.5 mL/min (A) (by C-G formula based on SCr of 0.54 mg/dL (L)).  Liver Function Tests: Recent Labs  Lab 10/08/17 0435 10/10/17 0343 10/11/17 0557  10/13/17 0500  AST  --  _2 ALT  --  63* 50* 46*  ALKPHOS  --  98 116 158*  BILITOT  --  0.4 0.4 0.3  PROT  --  6.4* 6.3* 6.9  ALBUMIN 2.3* 2.2* 2.3* 2.5*    HbA1C: Hgb A1c MFr Bld  Date/Time Value Ref Range Status  03/27/2017 06:54 AM 5.4 4.8 - 5.6 % Final    Comment:    (NOTE) Pre diabetes:          5.7%-6.4% Diabetes:              >6.4% Glycemic control for   <7.0% adults with diabetes   07/02/2016 07:34 AM 5.4 4.8 - 5.6 % Final    Comment:    (NOTE)         Pre-diabetes: 5.7 - 6.4         Diabetes: >6.4         Glycemic control for adults with diabetes: <7.0     CBG: Recent Labs  Lab 10/13/17 1605 10/13/17 2009 10/14/17 0009 10/14/17 0441 10/14/17 0740  GLUCAP 139* 124* 125* 104* 105*    Recent Results (from the past 240 hour(s))  Culture, bal-quantitative     Status: Abnormal (Preliminary result)   Collection Time: 10/05/17  3:40 PM  Result Value Ref Range Status   Specimen Description BRONCHIAL ALVEOLAR LAVAGE  Final   Special Requests NONE  Final   Gram Stain   Final    FEW WBC PRESENT, PREDOMINANTLY PMN RARE GRAM POSITIVE COCCI    Culture (A)  Final    30,000 COLONIES/mL STAPHYLOCOCCUS AUREUS 20,000 COLONIES/mL ESCHERICHIA COLI ORGANISM 1 Sent to [place] for further susceptibility testing. Performed at [facility], [place] [address], [place], [place] [address]    Report Status PENDING  Incomplete   Organism ID, Bacteria ESCHERICHIA COLI (A)  Final      Susceptibility   Escherichia coli - MIC*    AMPICILLIN <=2 SENSITIVE Sensitive     CEFAZOLIN <=4 SENSITIVE Sensitive     CEFEPIME <=1 SENSITIVE Sensitive     CEFTAZIDIME <=1 SENSITIVE Sensitive     CEFTRIAXONE <=1 SENSITIVE Sensitive     CIPROFLOXACIN <=0.25 SENSITIVE Sensitive     GENTAMICIN <=1 SENSITIVE Sensitive     IMIPENEM <=0.25 SENSITIVE Sensitive     TRIMETH/SULFA <=20 SENSITIVE Sensitive     AMPICILLIN/SULBACTAM <=2 SENSITIVE Sensitive     PIP/TAZO <=4 SENSITIVE  Sensitive     Extended ESBL NEGATIVE Sensitive     * 20,000 COLONIES/mL ESCHERICHIA COLI  Susceptibility, Aer + Anaerob     Status: None   Collection Time: 10/05/17  4:00 PM  Result Value Ref Range Status   Suscept, Aer + Anaerob Preliminary report  Final    Comment: (NOTE) Performed At: [facility] [address] [place], [place] [id number] Rush Farmer MD IP:[id number]    Source of Sample BRONCHIAL ALVEOLAR LAVAGE  Final    Comment: Performed at [facility], [place],  [place] [address]  Susceptibility Result     Status: None (Preliminary result)   Collection Time: 10/05/17  4:00 PM  Result Value Ref Range Status   Suscept Result 1 Staphylococcus aureus  Final    Comment: (NOTE) Identification performed by account, not confirmed by this laboratory. Performed At: [facility] [place], [place] [id number] Rush Farmer MD JM:[id number]    Antimicrobial Suscept PENDING  Incomplete     Scheduled Meds: . albuterol  2.5 mg Nebulization TID  . busPIRone  10 mg Per Tube BID  . chlorhexidine gluconate (MEDLINE KIT)  15 mL Mouth Rinse BID  . Chlorhexidine Gluconate Cloth  6 each Topical Daily  . citalopram  20 mg Per Tube Daily  . clonazePAM  1 mg Per Tube Q8H  . enoxaparin (LOVENOX) injection  70 mg Subcutaneous Q12H  . famotidine  20 mg Per Tube BID  . gabapentin  300 mg Per Tube Q12H  . levothyroxine  75 mcg Per Tube QAC breakfast  . mouth rinse  15 mL Mouth Rinse 10 times per day  . metoCLOPramide (REGLAN) injection  10 mg Intravenous Q6H  . nicotine  21 mg Transdermal Daily  . oxyCODONE  5 mg Per Tube Q6H  . QUEtiapine  200 mg Per Tube QHS  . sodium chloride flush  10-40 mL Intracatheter Q12H  . sodium chloride flush  3 mL Intravenous Q12H  . traZODone  100 mg Oral QHS  . valproic acid  500 mg Per Tube BID   Continuous Infusions: .  ceFAZolin (ANCEF) IV 2 g (10/14/17 0959)  . dextrose 5 % and 0.45% NaCl 75 mL/hr at  10/14/17 0900  . feeding supplement (VITAL AF 1.2 CAL) 70 mL/hr at 10/14/17 2297     LOS: 64 days   Cherene Altes, MD [facility] Office  [phone] Pager - Text Page per Amion  If 7PM-7AM, please contact night-coverage per Amion 10/14/2017, 10:25 AM

## 2017-10-14 NOTE — Progress Notes (Signed)
Occupational Therapy Treatment Patient Details Name: Philip Thompson MRN: [id number] DOB: 1990/07/19 Today's Date: 10/14/2017    History of present illness 27 y/o male admitted with colonic inertia/megacolon secondary to constipation and bilateral pulmonary emboli and DVT due to pelvic vein obstruction.  He had acute aspiration pneumonia on 9/10 prior to surgery in the setting of nausea/vomiting.  He went to the OR on 9/10 for total colectomy and ileostomy.  Extubated 9/12, Reintubated 9/13. 9/18 Trached and Cortrak placed PMH Anxiety/depression, hx of substance abuse disorder   OT comments  Pt eager for OOB to go outside and walk in halls. Remained up in chair at end of session. Pt requiring min guard assist for ambulation with RW, HR ranged from 132 to 141 with activity. Pt able to don front opening gown with min assist. Will be able to participate in grooming activities much easier once NGT and/or Cortrak is removed. Reminded mom to avoid helping pt with his socks as he is able to manage on his own.   Follow Up Recommendations  CIR;Supervision/Assistance - 24 hour    Equipment Recommendations       Recommendations for Other Services      Precautions / Restrictions Precautions Precautions: Fall Precaution Comments: watch HR       Mobility Bed Mobility Overal bed mobility: Needs Assistance Bed Mobility: Supine to Sit     Supine to sit: Supervision     General bed mobility comments: increased time  Transfers Overall transfer level: Needs assistance Equipment used: Rolling walker (2 wheeled) Transfers: Sit to/from Stand Sit to Stand: Min guard;+2 safety/equipment         General transfer comment: stood impulsively, cues for hand placement, good control of descent to chair    Balance Overall balance assessment: Needs assistance   Sitting balance-Leahy Scale: Fair     Standing balance support: Bilateral upper extremity supported Standing balance-Leahy Scale: Poor                             ADL either performed or assessed with clinical judgement   ADL Overall ADL's : Needs assistance/impaired     Grooming: Wash/dry face;Moderate assistance;Sitting Grooming Details (indicate cue type and reason): assist primarily due to cortrak and NGT         Upper Body Dressing : Minimal assistance;Sitting Upper Body Dressing Details (indicate cue type and reason): front opening gown                 Functional mobility during ADLs: Min guard;Rolling walker General ADL Comments: RN assisting with lines.     Vision       Perception     Praxis      Cognition Arousal/Alertness: Awake/alert Behavior During Therapy: Impulsive;Flat affect Overall Cognitive Status: Impaired/Different from baseline Area of Impairment: Safety/judgement                         Safety/Judgement: Decreased awareness of safety;Decreased awareness of deficits     General Comments: much improved cognition        Exercises     Shoulder Instructions       General Comments      Pertinent Vitals/ Pain       Pain Assessment: Faces Faces Pain Scale: Hurts little more Pain Location: back Pain Descriptors / Indicators: Aching Pain Intervention(s): Monitored during session;Repositioned  Home Living  Prior Functioning/Environment              Frequency  Min 2X/week        Progress Toward Goals  OT Goals(current goals can now be found in the care plan section)  Progress towards OT goals: Progressing toward goals  Acute Rehab OT Goals Patient Stated Goal: to go outside OT Goal Formulation: With patient Time For Goal Achievement: 10/22/17 Potential to Achieve Goals: Good  Plan Discharge plan remains appropriate    Co-evaluation                 AM-PAC PT "6 Clicks" Daily Activity     Outcome Measure   Help from another person eating meals?: Total Help  from another person taking care of personal grooming?: A Lot Help from another person toileting, which includes using toliet, bedpan, or urinal?: A Lot Help from another person bathing (including washing, rinsing, drying)?: A Lot Help from another person to put on and taking off regular upper body clothing?: A Little Help from another person to put on and taking off regular lower body clothing?: A Little 6 Click Score: 13    End of Session Equipment Utilized During Treatment: Oxygen  OT Visit Diagnosis: Cognitive communication deficit (R41.841);Other abnormalities of gait and mobility (R26.89);Unsteadiness on feet (R26.81);Muscle weakness (generalized) (M62.81);Pain   Activity Tolerance Patient tolerated treatment well   Patient Left in chair;with call bell/phone within reach;with family/visitor present   Nurse Communication Mobility status        Time: 1425-1510 OT Time Calculation (min): 45 min  Charges: OT General Charges $OT Visit: 1 Visit OT Treatments $Self Care/Home Management : 8-22 mins $Therapeutic Activity: 23-37 mins  Martie Round, OTR/L Acute Rehabilitation Services Pager: [phone] Office: [phone] Evern Bio 10/14/2017, 4:04 PM

## 2017-10-14 NOTE — Progress Notes (Signed)
Philip Thompson  DPO:[id number] DOB: 25-Apr-1990 DOA: 09/21/2017 PCP: [name], MD    LOS: 22 days    Reason for Consult / Chief Complaint:  Pre op acute hypoxemic respiratory failure   Consulting MD and date:  [name]  HPI/Summary of hospital stay:  27 y/o male admitted with colonic inertia/megacolon secondary to constipation and bilateral pulmonary emboli and DVT due to pelvic vein obstruction.  He had acute aspiration pneumonia on 9/10 prior to surgery in the setting of nausea/vomiting.  He went to the OR on 9/10 for total colectomy and ileostomy.    Ventilator weaning has been hampered by generalized weakness, periods of agitation and the patient / family's requests for sedation.  Initially started on TPN for nutrition now transitioning to enteral nutrition.  Found to have DVT. Filter placed initially given bleeding from JP drain. IV heparin eventually started and now transitioned to Lovenox. Filter remains in place.   9/22 - started on precedex for agitation, spitting at staff/kicking, attempting to bite 9/23 - more oriented, klonopin increased / did not tolerate   Subjective:  Sitting in chair in no acute distress wants trach out  Objective   Blood pressure (!) 117/51, pulse (!) 126, temperature 98.4 F (36.9 C), temperature source Oral, resp. rate (!) 30, height 5' 7.99" (1.727 m), weight 64.5 kg, SpO2 97 %.    FiO2 (%):  [35 %] 35 %   Intake/Output Summary (Last 24 hours) at 10/14/2017 0934 Last data filed at 10/14/2017 0900 Gross per 24 hour  Intake 6136.84 ml  Output 4410 ml  Net 1726.84 ml   Filed Weights   10/12/17 0332 10/13/17 0500 10/14/17 0500  Weight: 67.3 kg 66 kg 64.5 kg    Examination:  General: Cachectic male sitting in chair no acute distress able to verbalize HEENT: Trach in place, currently on trach collar Neuro: Intact follows commands able to phonate CV: Regular rate rhythm sinus tach 130 PULM: even/non-labored, lungs bilaterally  managed bases GI: Dressing intact Extremities: warm/dry, neg  edema  Skin: no rashes or lesions   Consults: date of consult/date signed off & final recs:  General surgery 9/6 GI 9/6 Critical care 9/10   Procedures: 9/10 - Total colectomy and ileostomy.  Was found to have a massively distended colon from the cecum all the way to distal rectum and pelvic rim.  No evidence of perforation. Oral endotracheal tube 9/10 >> 9/12.  9/13 >  PICC 9/11>>  ETT 9/10 >> 9/12;  9/13 >> 9/19 Retrievable IVC filter 9/15 6 shiley trach cuffed 9/18 >>  Assessment & Plan:   Acute respiratory failure with hypoxemia due to nutrition related weakness and mucus plugging compounded by sedation use. P: Tolerating trach collar 24 hours Within the next 72 hours consider downsizing capping trach Ready for decannulation within 5 to 6 days.  Provoked DVT/PE from bowel obstruction/abdominal compartment syndrome - anticoagulation held d/t JP bled on 9/14  - no evidence of RV strain on CT - S/p retrievable filter 9/15 P: He has a IVC filter in place Retrieval within 4 weeks in place  Tachycardia - mild fever, possible volume depletion  P: Per primary  Leukocytosis  + BAL culture for Staph Aureus and E coli.  RLL collapse/atelectasis P: After culture data  Ogilvie's syndrome s/p colectomy and ileostomy -JP drain out, staples out 9/23 P:  General surgery is following  Protein calorie malnutrition -did not tolerate initial trials of enteral feeding  P:  Continue  tube feedings per primary  High Output Ileostomy/ NG Bilious from both sites At Risk Alkalosis  P: Per primary and general surgery  Anxiety/depression, hx of substance abuse disorder -difficult situation to manage agitation given numerous home pysch meds and prior substance abuse.   P: Angiolytics per primary  Hypothyroidism - TSH 3.1 9/23 P: Continue Synthroid for primary   Disposition / Summary of Today's Plan 10/14/17     Reduce klonopin dosing.  Continue ATC.  Defer NGT to CCS.  CIR consult for rehab efforts.  To [facility] as of 9/25.  PCCM will continue to monitor for trach care. 10/14/2017 PCCM continue to monitor for trach care     Feeding: TPN / TF   Analgesia: enteral oxy Sedation: RASS goal 0   Thromboprophylaxis: IVC filter, Lovenox. HOB >30 degrees Ulcer prophylaxis: famotidine Glucose control: CBG q 4, SSI Family: 10/14/2017 mother updated bedside along with patient.  Labs and ancillary test (personally reviewed)   Recent Labs  Lab 10/08/17 0435 10/09/17 0448 10/10/17 0343 10/11/17 0557 10/12/17 0500 10/13/17 0500  NA 135 139 137 136 136 134*  K 4.0 3.4* 3.7 4.3 4.5 5.1  CL 97* 102 103 102 99 99  CO2 _0 GLUCOSE 117* 83 127* 106* 109* 240*  BUN _1 CREATININE 0.48* 0.43* 0.50* 0.46* 0.46* 0.54*  CALCIUM 8.9 9.1 8.8* 8.9 8.8* 9.2  MG 2.0  --   --  1.8 1.9 2.1  PHOS 3.7  --   --  3.3 3.5 4.4    Recent Labs  Lab 10/12/17 0500 10/13/17 0500 10/14/17 0415  HGB 11.2* 12.7* 12.0*  HCT 36.0* 41.0 38.4*  WBC 10.9* 12.4* 12.2*  PLT 305 384 317    Noe Gens, NP-C [facility] Pgr: [phone] or if no answer [phone] 10/14/2017, 9:34 AM

## 2017-10-14 NOTE — Progress Notes (Signed)
PHARMACY - ADULT TOTAL PARENTERAL NUTRITION CONSULT NOTE   Pharmacy Consult for TPN Indication: massive bowel resection  Patient Measurements: Height: 5' 7.99" (172.7 cm) Weight: 142 lb 3.2 oz (64.5 kg) IBW/kg (Calculated) : 68.38  TPN AdjBW (KG): 77.8 Body mass index is 21.63 kg/m. Usual Weight: 87 kg  Assessment:  [name] admitted on 9/4 with complete colonic inertia, megacolon and severe obstructive constipation. Pt indicates poor intake and weight loss PTA over 1 month time frame. 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 Was found to have a massively distended colon from the cecum all the way to distal rectum and pelvic rim.  No evidence of perforation. Pharmacy has been consulted to start TPN for severe malnutrition related to chronic illness (cardiomyopathy, substance abuse, chronic constipation/obstruction with megacolon) associated with severe muscle and fat depletion and edema.   GI: Ogilvie's syndrome - s/p colectomy and ileostomy 9/10. NPO (since admission) with poor oral intake PTA. TPN initiated 9/12. Pre-albumin improved at 19.3. JP drain now out. LBM (1500 ml ileostomy output/24h). CT abd neg 9/14 for new acute process. Post-pyloric feeding tube placed 9/23 - resumed Vital AF 1.2 on 9/23 - currently at 65ml/hr this morning (goal 80 ml/hr) - currently off as meds just given. NGT o/p remains ~2000/day but now clearer with bile.  Reglan added 9/25.  Wt 145 lb (large decrease since admission but stable over past few days)   TPN Access: PICC triple lumen placed 9/11 TPN start date: 9/12 Nutritional Goals (per RD recommendation on 9/23): KCal: 2200-2400 Protein: 138-152 g Fluid: > 2 L  Current Nutrition: Vital AF at 70 mL/hr  (>75% of goal rate) and pt appears to be tolerating.with good amount of NGT o/p) + TPN  Plan:  - D/W Barnetta Chapel, CCS PA who agreed with stopping TPN. - Will change famotidine to per tube - D/c sliding scale  insulin and CBG checks - D/c TPN labs   Christoper Fabian, PharmD, BCPS Clinical pharmacist  **Pharmacist phone directory can now be found on [email or web address] ([email or web address]).  Listed under [facility]. 10/14/2017 10:10 AM

## 2017-10-14 NOTE — Progress Notes (Signed)
Inpatient Rehabilitation  Called and initiated discussion with patient's mom, Lynden Ang about post acute rehab.  Patient resides with his dad, who is disabled and his mom Lynden Ang assists with supervision of home management, medications, and drives to medical appointments.  Following her cues/reminders patient could independently perform self-care, but could be left alone at home for short periods of time.  Anticipate likely need for 24/7 supervision and assist.  Lynden Ang acknowledged that she would need to take FMLA or have other family assist [place] while she was at work.  Will continue to follow along for medical stability and ability to tolerate therapy.  Plan to follow up again Monday, 10/17/17.  Call if questions.   Charlane Ferretti., CCC/SLP Admission Coordinator  [facility] Inpatient Rehabilitation  Cell [phone]

## 2017-10-14 NOTE — Consult Note (Signed)
WOC Nurse ostomy follow up Stoma type/location: RLQ ileostomy Stomal assessment/size: 1 3/8" pink and moist well budded stoma. Patient up in chair and talkative today.   Peristomal assessment: see above Treatment options for stomal/peristomal skin: barrier ring and convex pouch Output liquid brown stool  Ostomy pouching: 1pc.convex with barrier ring Education provided: mother at bedside  States she is feeling good about pouch change process Enrolled patient in [place] Secure Start Discharge program: no WOC team will follow.  Maple Hudson MSN, RN, FNP-BC CWON Wound, Ostomy, Continence Nurse Pager [phone]

## 2017-10-14 NOTE — Progress Notes (Signed)
Patient ID: Philip Thompson, male   DOB: January 21, 1990, 27 y.o.   MRN: [id number]    17 Days Post-Op  Subjective: Doing well from respiratory standpoint.  Vocalizing and talking great today.  Wants to get up OOB and get better.  NGT output went back up.  Objective: Vital signs in last 24 hours: Temp:  [98.2 F (36.8 C)-98.6 F (37 C)] 98.4 F (36.9 C) (09/27 0729) Pulse Rate:  [118-152] 126 (09/27 0900) Resp:  [12-38] 30 (09/27 0900) BP: (83-138)/(51-96) 117/51 (09/27 0800) SpO2:  [89 %-97 %] 97 % (09/27 0900) FiO2 (%):  [35 %] 35 % (09/27 0714) Weight:  [64.5 kg] 64.5 kg (09/27 0500) Last BM Date: 10/13/17  Intake/Output from previous day: 09/26 0701 - 09/27 0700 In: 5565.9 [I.V.:3266.8; NG/GT:780; IV Piggyback:1519.1] Out: 4410 [Urine:1235; Emesis/NG output:2600; Stool:575] Intake/Output this shift: Total I/O In: 940.8 [I.V.:188.9; NG/GT:96.8; IV Piggyback:655.2] Out: 150 [Stool:150]  PE: Abd: soft, NT, ND, +BS, ostomy with minimal output currently.  Stoma viable.  NGT with clear bilious output, not mixed with TF today.  2.6L noted yesterday.  PANDA in place with TFs running.  Lab Results:  Recent Labs    10/13/17 0500 10/14/17 0415  WBC 12.4* 12.2*  HGB 12.7* 12.0*  HCT 41.0 38.4*  PLT 384 317   BMET Recent Labs    10/12/17 0500 10/13/17 0500  NA 136 134*  K 4.5 5.1  CL 99 99  CO2 26 26  GLUCOSE 109* 240*  BUN 18 16  CREATININE 0.46* 0.54*  CALCIUM 8.8* 9.2   PT/INR No results for input(s): LABPROT, INR in the last 72 hours. CMP     Component Value Date/Time   NA 134 (L) 10/13/2017 0500   K 5.1 10/13/2017 0500   CL 99 10/13/2017 0500   CO2 26 10/13/2017 0500   GLUCOSE 240 (H) 10/13/2017 0500   BUN 16 10/13/2017 0500   CREATININE 0.54 (L) 10/13/2017 0500   CALCIUM 9.2 10/13/2017 0500   PROT 6.9 10/13/2017 0500   ALBUMIN 2.5 (L) 10/13/2017 0500   AST 26 10/13/2017 0500   ALT 46 (H) 10/13/2017 0500   ALKPHOS 158 (H) 10/13/2017 0500   BILITOT 0.3  10/13/2017 0500   GFRNONAA >60 10/13/2017 0500   GFRAA >60 10/13/2017 0500   Lipase     Component Value Date/Time   LIPASE 37 09/21/2017 2237       Studies/Results: Dg Abd 1 View  Result Date: 10/14/2017 CLINICAL DATA:  27 year old male with enteric tube placement EXAM: ABDOMEN - 1 VIEW COMPARISON:  Abdominal radiograph dated 10/13/2017 FINDINGS: A feeding tube is noted with tip in the third portion of the duodenum. A NG tube with side-port in the body of the stomach and tip in the distal stomach. No bowel dilatation. Partially visualized right lower quadrant ostomy. IVC filter. Right lung base atelectatic changes. Partially visualized right-sided central venous line with tip close to the cavoatrial junction. IMPRESSION: NG tube with tip in the distal stomach and a feeding tube with tip in the distal duodenum. Electronically Signed   By: Elgie Collard M.D.   On: 10/14/2017 03:38   Dg Abd 1 View  Result Date: 10/13/2017 CLINICAL DATA:  Abdominal distention EXAM: ABDOMEN - 1 VIEW COMPARISON:  10/08/2017 FINDINGS: Feeding tube tip is in the transverse duodenum. Right lower quadrant ostomy noted. No current bowel distention. No free air organomegaly. NG tube remains in the stomach. IVC filter in stable position. IMPRESSION: Decreasing bowel distension. No current bowel  distention to suggest bowel obstruction. Feeding tube tip in the transverse duodenum. Electronically Signed   By: Charlett Nose M.D.   On: 10/13/2017 18:25    Anti-infectives: Anti-infectives (From admission, onward)   Start     Dose/Rate Route Frequency Ordered Stop   10/09/17 1730  ceFAZolin (ANCEF) IVPB 2g/100 mL premix     2 g 200 mL/hr over 30 Minutes Intravenous Every 8 hours 10/09/17 1641 10/16/17 2359   10/08/17 0815  erythromycin ethylsuccinate (EES) 200 MG/5ML suspension 200 mg  Status:  Discontinued     200 mg Per Tube Every 6 hours 10/08/17 0807 10/09/17 1636   09/27/17 1000  piperacillin-tazobactam (ZOSYN)  IVPB 3.375 g  Status:  Discontinued     3.375 g 12.5 mL/hr over 240 Minutes Intravenous Every 8 hours 09/27/17 0845 10/06/17 1504       Assessment/Plan S/P subtotal colectomy with ileostomy 9/10 Dr. Magnus Ivan- Gastric atony, continue NG to suction and post pyloric TF. Reglan added.   -output increased back to 2.6L yesterday.   -? At what point do we consider GJ tube placement if this continues to persist and he continues to have high NGT output. -wants some ice chips.  This is likely ok, but given trach and no speech assessment yet, will d/w MD PE- Lovenox Acute resp failure- per CCM Tachycardia/SVT - per medicine PT/OT- rec CIR   LOS: 22 days    Letha Cape , [facility] 10/14/2017, 9:04 AM Pager: [phone]

## 2017-10-15 LAB — COMPREHENSIVE METABOLIC PANEL
ALK PHOS: 187 U/L — AB (ref 38–126)
ALT: 37 U/L (ref 0–44)
AST: 24 U/L (ref 15–41)
Albumin: 2.9 g/dL — ABNORMAL LOW (ref 3.5–5.0)
Anion gap: 10 (ref 5–15)
BILIRUBIN TOTAL: 0.5 mg/dL (ref 0.3–1.2)
BUN: 19 mg/dL (ref 6–20)
CALCIUM: 10.2 mg/dL (ref 8.9–10.3)
CO2: 31 mmol/L (ref 22–32)
CREATININE: 0.63 mg/dL (ref 0.61–1.24)
Chloride: 97 mmol/L — ABNORMAL LOW (ref 98–111)
Glucose, Bld: 107 mg/dL — ABNORMAL HIGH (ref 70–99)
Potassium: 4.3 mmol/L (ref 3.5–5.1)
Sodium: 138 mmol/L (ref 135–145)
Total Protein: 8.1 g/dL (ref 6.5–8.1)

## 2017-10-15 LAB — CBC
HEMATOCRIT: 41.7 % (ref 39.0–52.0)
Hemoglobin: 13.1 g/dL (ref 13.0–17.0)
MCH: 28.4 pg (ref 26.0–34.0)
MCHC: 31.4 g/dL (ref 30.0–36.0)
MCV: 90.3 fL (ref 78.0–100.0)
Platelets: 342 10*3/uL (ref 150–400)
RBC: 4.62 MIL/uL (ref 4.22–5.81)
RDW: 14.4 % (ref 11.5–15.5)
WBC: 8.9 10*3/uL (ref 4.0–10.5)

## 2017-10-15 LAB — GLUCOSE, CAPILLARY
GLUCOSE-CAPILLARY: 92 mg/dL (ref 70–99)
GLUCOSE-CAPILLARY: 120 mg/dL — AB (ref 70–99)
Glucose-Capillary: 113 mg/dL — ABNORMAL HIGH (ref 70–99)
Glucose-Capillary: 110 mg/dL — ABNORMAL HIGH (ref 70–99)
Glucose-Capillary: 121 mg/dL — ABNORMAL HIGH (ref 70–99)

## 2017-10-15 LAB — PHOSPHORUS: Phosphorus: 4.8 mg/dL — ABNORMAL HIGH (ref 2.5–4.6)

## 2017-10-15 LAB — MAGNESIUM: MAGNESIUM: 2 mg/dL (ref 1.7–2.4)

## 2017-10-15 MED ORDER — SODIUM CHLORIDE 0.9 % IV BOLUS
500.0000 mL | Freq: Once | INTRAVENOUS | Status: AC
  Administered 2017-10-15: 500 mL via INTRAVENOUS

## 2017-10-15 MED ORDER — PROPRANOLOL HCL 20 MG/5ML PO SOLN
20.0000 mg | Freq: Three times a day (TID) | ORAL | Status: DC
  Administered 2017-10-15 (×3): 20 mg
  Filled 2017-10-15 (×4): qty 5

## 2017-10-15 MED ORDER — ENOXAPARIN SODIUM 60 MG/0.6ML ~~LOC~~ SOLN
60.0000 mg | Freq: Two times a day (BID) | SUBCUTANEOUS | Status: AC
  Administered 2017-10-15 – 2017-10-16 (×3): 60 mg via SUBCUTANEOUS
  Filled 2017-10-15 (×3): qty 0.6

## 2017-10-15 MED ORDER — ACETAMINOPHEN 160 MG/5ML PO SOLN
650.0000 mg | Freq: Four times a day (QID) | ORAL | Status: DC | PRN
  Administered 2017-10-15 – 2017-10-18 (×4): 650 mg
  Filled 2017-10-15 (×4): qty 20.3

## 2017-10-15 NOTE — Consult Note (Signed)
Chief Complaint: Patient was seen in consultation today for gastric atony  Referring Physician(s): Dr. Harlon Flor  Supervising Physician: Jolaine Click  Patient Status: [facility] - In-pt  History of Present Illness: Philip Thompson is a 27 y.o. male with past medical history of polysubstance abuse, depression, anxiety admitted with abdominal pain, distention constipation on 09/22/17 who was found to have fecal impaction and megacolon.  Patient is now s/p colectomy and end ileostomy 09/27/17.  He required prolonged mechanical ventilation post-op and is now s/p trach placement.  He has continued gastric atony and is now in need of G-J tube placement.  IR consulted for placement.  Case reviewed and approved by Dr. Bonnielee Haff.   Past Medical History:  Diagnosis Date  . ADD (attention deficit disorder)   . Aggression   . Allergic rhinitis   . Anxiety   . Arthritis    joint pain  . Cardiomyopathy (HCC)    a. EF <25% in 2015 during admission for multi substance intoxication/respiratory failure requiring intubation.  . Chronic back pain   . Constipation   . Depression   . Fecal obstruction (HCC)   . Frontal head injury   . Heroin overdose (HCC)   . Obstipation   . Polysubstance abuse (HCC)   . Suicide attempt [facility])     Past Surgical History:  Procedure Laterality Date  . COLECTOMY N/A 09/27/2017   Procedure: SUBTOTAL COLECTOMY AND ILEOSTOMY;  Surgeon: Abigail Miyamoto, MD;  Location: [facility] OR;  Service: General;  Laterality: N/A;  . FOOT SURGERY     to get a BB out  . IR IVC FILTER PLMT / S&I /IMG GUID/MOD SED  10/02/2017  . NO PAST SURGERIES      Allergies: Patient has no known allergies.  Medications: Prior to Admission medications   Medication Sig Start Date End Date Taking? Authorizing Provider  busPIRone (BUSPAR) 15 MG tablet Take 1 tablet (15 mg total) by mouth 3 (three) times daily. 09/07/17  Yes Eksir, Bo Mcclintock, MD  citalopram (CELEXA) 40 MG tablet Take 1 tablet (40 mg total)  by mouth daily. 09/07/17 09/07/18 Yes Eksir, Bo Mcclintock, MD  cyclobenzaprine (FLEXERIL) 10 MG tablet Take 1 tablet (10 mg total) by mouth 3 (three) times daily as needed for muscle spasms. 03/29/17  Yes Armandina Stammer I, NP  divalproex (DEPAKOTE) 500 MG DR tablet Take 1 tablet (500 mg total) by mouth every 12 (twelve) hours. For mood stabilization 09/07/17  Yes Eksir, Bo Mcclintock, MD  docusate sodium (COLACE) 100 MG capsule Take 1 capsule (100 mg total) by mouth 2 (two) times daily. Patient taking differently: Take 100 mg by mouth daily.  09/19/17  Yes Tat, Onalee Hua, MD  furosemide (LASIX) 20 MG tablet Take 1 tablet (20 mg total) by mouth daily. Prn leg swelling Patient taking differently: Take 20 mg by mouth daily as needed for fluid or edema. As needed for leg swelling 05/11/17  Yes Sherie Don C, NP  gabapentin (NEURONTIN) 300 MG capsule Take 2 capsules (600 mg total) by mouth 3 (three) times daily. For agitation 09/07/17 09/07/18 Yes Eksir, Bo Mcclintock, MD  ibuprofen (ADVIL,MOTRIN) 600 MG tablet Take 1 tablet (600 mg total) by mouth every 6 (six) hours as needed for moderate pain. (May buy from over the counter): For pain 03/29/17  Yes Armandina Stammer I, NP  levothyroxine (SYNTHROID) 50 MCG tablet Take 1 tablet (50 mcg total) by mouth daily before breakfast. 09/19/17  Yes Tat, Onalee Hua, MD  linaclotide [facility]) 290 MCG CAPS  capsule Take 1 capsule (290 mcg total) by mouth daily before breakfast. 09/20/17  Yes Tat, Onalee Hua, MD  potassium chloride (K-DUR,KLOR-CON) 10 MEQ tablet Take 1 tablet (10 mEq total) by mouth daily. With furosemide Patient taking differently: Take 10 mEq by mouth daily as needed (only takes when Lasix (Furosemide)). With furosemide 05/11/17  Yes Campbell Riches, NP  QUEtiapine (SEROQUEL) 400 MG tablet Take 2 tablets (800 mg total) by mouth at bedtime. 09/07/17  Yes Eksir, Bo Mcclintock, MD  traMADol (ULTRAM) 50 MG tablet Take 50 mg by mouth every 12 (twelve) hours as needed for moderate  pain or severe pain.  05/03/17  Yes [provider]  traZODone (DESYREL) 100 MG tablet Take 1 tablet (100 mg total) by mouth at bedtime. 09/07/17  Yes Eksir, Bo Mcclintock, MD     Family History  Problem Relation Age of Onset  . Dementia Father   . Alcohol abuse Father   . Diabetes Brother   . Cancer - Ovarian Maternal Grandmother   . Cancer - Lung Paternal Grandmother   . Cancer Paternal Grandfather   . Diabetes Other   . Hypertension Other   . Heart disease Other   . Heart attack Other     Social History   Socioeconomic History  . Marital status: Single    Spouse name: Not on file  . Number of children: Not on file  . Years of education: Not on file  . Highest education level: 12th grade  Occupational History  . Not on file  Social Needs  . Financial resource strain: Somewhat hard  . Food insecurity:    Worry: Never true    Inability: Never true  . Transportation needs:    Medical: No    Non-medical: No  Tobacco Use  . Smoking status: Current Every Day Smoker    Packs/day: 1.00    Years: 7.00    Pack years: 7.00    Types: Cigarettes  . Smokeless tobacco: Never Used  Substance and Sexual Activity  . Alcohol use: Not Currently    Comment: hx of. last use 01/2016  . Drug use: Yes    Types: Marijuana, Cocaine    Comment: last used marijuana 3 weeks ago. heroin last used  6 months ago.   Marland Kitchen Sexual activity: Yes    Birth control/protection: Condom  Lifestyle  . Physical activity:    Days per week: 0 days    Minutes per session: 0 min  . Stress: To some extent  Relationships  . Social connections:    Talks on phone: More than three times a week    Gets together: Twice a week    Attends religious service: Never    Active member of club or organization: No    Attends meetings of clubs or organizations: Never    Relationship status: Never married  Other Topics Concern  . Not on file  Social History Narrative  . Not on file     Review of Systems: A  12 point ROS discussed and pertinent positives are indicated in the HPI above.  All other systems are negative.  Review of Systems  Constitutional: Negative for fatigue and fever.  Respiratory: Negative for cough and shortness of breath.   Cardiovascular: Negative for chest pain.  Gastrointestinal: Positive for abdominal pain and constipation.  Genitourinary: Negative for dysuria and hematuria.  Musculoskeletal: Negative for back pain.  Psychiatric/Behavioral: Negative for behavioral problems and confusion.    Vital Signs: BP 125/80  Pulse (!) 111   Temp (!) 97.4 F (36.3 C) (Oral)   Resp 14   Ht 5' 7.99" (1.727 m)   Wt 135 lb 12.9 oz (61.6 kg)   SpO2 99%   BMI 20.65 kg/m   Physical Exam  Constitutional: He is oriented to person, place, and time. He appears well-developed. No distress.  Neck: Normal range of motion. Neck supple.  Trach in place  Cardiovascular: Regular rhythm and normal heart sounds. Exam reveals no gallop and no friction rub.  No murmur heard. tachycardia  Pulmonary/Chest: Effort normal and breath sounds normal. No respiratory distress.  Abdominal: Soft. There is no tenderness.  NGT and post-pyloric feeding tube in place.  NGT with bilious output.   Neurological: He is alert and oriented to person, place, and time.  Skin: Skin is warm and dry. He is not diaphoretic.  Psychiatric: He has a normal mood and affect. His behavior is normal. Judgment and thought content normal.  Nursing note and vitals reviewed.    MD Evaluation Airway: Other (comments) Airway comments: trach Heart: WNL Abdomen: WNL Chest/ Lungs: WNL ASA  Classification: 3 Mallampati/Airway Score: Three   Imaging: Dg Chest 2 View  Result Date: 09/18/2017 CLINICAL DATA:  Hemoptysis. EXAM: CHEST - 2 VIEW COMPARISON:  CT abdomen pelvis dated September 15, 2017. Chest x-ray dated August 27, 2013. FINDINGS: The heart size and mediastinal contours are within normal limits. Normal pulmonary  vascularity. Mild subsegmental atelectasis in the right middle lobe and bilateral lower lobes. Nodular density in the lower lobes on the lateral view likely represents a vessel, as no discrete nodule is seen in this area on recent CT abdomen from 2 days ago. No acute osseous abnormality. Unchanged markedly distended colon in the upper abdomen. IMPRESSION: 1. Bibasilar atelectasis. Electronically Signed   By: Obie Dredge M.D.   On: 09/18/2017 12:16   Dg Abd 1 View  Result Date: 10/14/2017 CLINICAL DATA:  27 year old male with enteric tube placement EXAM: ABDOMEN - 1 VIEW COMPARISON:  Abdominal radiograph dated 10/13/2017 FINDINGS: A feeding tube is noted with tip in the third portion of the duodenum. A NG tube with side-port in the body of the stomach and tip in the distal stomach. No bowel dilatation. Partially visualized right lower quadrant ostomy. IVC filter. Right lung base atelectatic changes. Partially visualized right-sided central venous line with tip close to the cavoatrial junction. IMPRESSION: NG tube with tip in the distal stomach and a feeding tube with tip in the distal duodenum. Electronically Signed   By: Elgie Collard M.D.   On: 10/14/2017 03:38   Dg Abd 1 View  Result Date: 10/13/2017 CLINICAL DATA:  Abdominal distention EXAM: ABDOMEN - 1 VIEW COMPARISON:  10/08/2017 FINDINGS: Feeding tube tip is in the transverse duodenum. Right lower quadrant ostomy noted. No current bowel distention. No free air organomegaly. NG tube remains in the stomach. IVC filter in stable position. IMPRESSION: Decreasing bowel distension. No current bowel distention to suggest bowel obstruction. Feeding tube tip in the transverse duodenum. Electronically Signed   By: Charlett Nose M.D.   On: 10/13/2017 18:25   Dg Abd 1 View  Result Date: 10/10/2017 CLINICAL DATA:  27 year old male requiring feeding tube for nutrition. Fluoroscopic advancement of intragastric feeding tube requested. EXAM: ABDOMEN - 1 VIEW  COMPARISON:  10/08/2017. FLUOROSCOPY TIME:  2 minutes 24 seconds FINDINGS: A single fluoroscopic image demonstrates feeding tube tracking from the stomach into the proximal duodenum. A small volume of contrast injected opacifies the  proximal duodenum. Superimposed NG type enteric tube in the stomach. IVC filter in place. Abdominal skin staples redemonstrated. IMPRESSION: Fluoroscopic advancement of feeding tube tip to the proximal duodenum. Electronically Signed   By: Odessa Fleming M.D.   On: 10/10/2017 11:08   Dg Abd 1 View  Result Date: 10/08/2017 CLINICAL DATA:  NG tube placement, bowel obstruction EXAM: ABDOMEN - 1 VIEW COMPARISON:  10/01/2017, 09/27/2017 FINDINGS: NG tube within the proximal stomach. Feeding tube coiled in the stomach fundus as well. Diffuse gaseous distention of the bowel. Ostomy in the right lower quadrant. Bowel gas pattern remains nonspecific for mild obstruction versus ileus. Retained stool in the residual rectum. Midline staples noted. IVC filter present. Surgical drain in the pelvis. IMPRESSION: Stable bowel gas distension remaining nonspecific for mild obstruction versus ileus. NG tube in the proximal stomach. Electronically Signed   By: Judie Petit.  Shick M.D.   On: 10/08/2017 09:27   Dg Abdomen 1 View  Result Date: 09/21/2017 CLINICAL DATA:  Megacolon with little bowel movement while in hospital. EXAM: ABDOMEN - 1 VIEW COMPARISON:  Gastrografin enema images from 09/18/2017 and CT from 09/15/2017 FINDINGS: Significant stool retention throughout the visualized colon compatible with severe constipation and obstipation. Free air identified. No acute osseous appearing abnormality limited by overlying stool filled bowel. IMPRESSION: Significant stool retention throughout the colon. No significant change. Electronically Signed   By: Tollie Eth M.D.   On: 09/21/2017 23:22   Ct Angio Chest Pe W Or Wo Contrast  Result Date: 09/22/2017 CLINICAL DATA:  Recent admission for megacolon. Now unable to  eat solids with severely distended abdomen. Shortness of breath. EXAM: CT ANGIOGRAPHY CHEST CT ABDOMEN AND PELVIS WITH CONTRAST TECHNIQUE: Multidetector CT imaging of the chest was performed using the standard protocol during bolus administration of intravenous contrast. Multiplanar CT image reconstructions and MIPs were obtained to evaluate the vascular anatomy. Multidetector CT imaging of the abdomen and pelvis was performed using the standard protocol during bolus administration of intravenous contrast. CONTRAST:  ISOVUE-370 IOPAMIDOL (ISOVUE-370) INJECTION 76% COMPARISON:  CT abdomen and pelvis 09/15/2017. FINDINGS: CTA CHEST FINDINGS Cardiovascular: Motion artifact limits examination. There is good opacification of the central and segmental pulmonary arteries. Filling defects are demonstrated in the distal main and lower lobe pulmonary arteries bilaterally consistent with bilateral pulmonary emboli. Normal caliber thoracic aorta. Great vessel origins are patent. Normal heart size. Small pericardial effusion. Normal RV to LV ratio at 0.8. Mediastinum/Nodes: No significant lymphadenopathy. Esophagus is decompressed. Lungs/Pleura: Emphysematous changes and fibrosis in both lungs. Fluid or thickened pleura in the fissures. Patchy areas of infiltration or consolidation in both lungs could represent pneumonia or infarcts. No pleural effusions. No pneumothorax. Airways are patent. Musculoskeletal: No chest wall abnormality. No acute or significant osseous findings. Review of the MIP images confirms the above findings. CT ABDOMEN and PELVIS FINDINGS Hepatobiliary: No focal liver lesions. Gallbladder is mildly dilated with probable sludge. No wall thickening. Bile ducts are not dilated. Pancreas: Unremarkable. No pancreatic ductal dilatation or surrounding inflammatory changes. Spleen: Normal in size without focal abnormality. Adrenals/Urinary Tract: Adrenal glands are unremarkable. Kidneys are normal, without  renal calculi, focal lesion, or hydronephrosis. Bladder is thick-walled, suggesting cystitis. Stomach/Bowel: There is massive distention of the colon throughout. The colon is filled with gas and stool. Large amount of stool in the rectum. Rectal diameter measures up to 17 cm. Rectal wall thickening may indicate stercoral colitis. Small bowel and stomach are decompressed. Vascular/Lymphatic: No significant vascular findings are present. No enlarged abdominal  or pelvic lymph nodes. Prominent bilateral groin lymph nodes of nonspecific etiology, probably reactive. Reproductive: Prostate is unremarkable. Other: No free air or free fluid in the abdomen. Edema in the subcutaneous fat. Musculoskeletal: No acute or significant osseous findings. Review of the MIP images confirms the above findings. IMPRESSION: 1. Positive examination for bilateral pulmonary emboli. No evidence of right heart strain. 2. Diffuse emphysematous changes in the lungs. Patchy airspace disease may represent pneumonia or infarcts. 3. Massive distention of the colon throughout. Colon is filled with gas and mostly stool. Large amount of stool in the rectum with rectal wall thickening may indicate stercoral colitis. Emphysema (ICD10-J43.9). These results were called by telephone at the time of interpretation on 09/22/2017 at 1:07 am to Dr. Cheron Schaumann , who verbally acknowledged these results. Electronically Signed   By: Burman Nieves M.D.   On: 09/22/2017 01:07   Ct Abdomen Pelvis W Contrast  Result Date: 10/01/2017 CLINICAL DATA:  Post subtotal colectomy with ileostomy. Blood out of JP drain. EXAM: CT ABDOMEN AND PELVIS WITH CONTRAST TECHNIQUE: Multidetector CT imaging of the abdomen and pelvis was performed using the standard protocol following bolus administration of intravenous contrast. CONTRAST:  OMNIPAQUE IOHEXOL 300 MG/ML  SOLN COMPARISON:  09/22/2017 and 09/15/2016 FINDINGS: Lower chest: Patchy airspace consolidation over the right  lower lobe which may be due to atelectasis or infection. Small right pleural effusion. Linear density over the left lower lobe likely atelectasis. Mild patchy peripheral opacification over the lingula and left lower lobe which may be due to atelectasis or infection. Hepatobiliary: Gallbladder somewhat contracted. Liver and biliary tree are within normal. Pancreas: Normal. Spleen: Normal. Adrenals/Urinary Tract: Adrenal glands are normal. Kidneys are normal in size without hydronephrosis or nephrolithiasis. Foley catheter is present within the bladder which is somewhat decompressed. Stomach/Bowel: Nasogastric tube has tip just below the gastroesophageal junction. Border of the stomach is difficult to accurately identified. Patient has undergone interval subtotal colectomy with ileostomy. Ileostomy site over the right lower quadrant is unremarkable. There are several fluid-filled mildly dilated small bowel loops over the upper abdomen likely postoperative ileus. Several small bowel loops have mild wall thickening and mucosal enhancement likely postoperative although can be seen due to regional enteritis of infectious or inflammatory nature. No definite free peritoneal air. Is difficult to follow the course of the remaining colon. There is moderate fecal retention over the rectum with mild rectal wall thickening with significant improvement compared to the previous exam. Vascular/Lymphatic: Within normal. Reproductive: Within normal. Other: No definite intra-abdominal abscess. Mild diffuse subcutaneous edema over the lower abdomen/pelvis. Skin staples vertically over the midline abdomen. There is a percutaneous drain entering the left anterior lower abdomen with tip over the right lower quadrant. Musculoskeletal: Unchanged. IMPRESSION: Evidence of patient's recent subtotal colectomy and ileostomy. Ileostomy site is unremarkable. Several fluid-filled mildly dilated small bowel loops over the upper abdomen with mucosal  enhancement and mild wall thickening. Findings are likely postoperative, although regional enteritis of infectious or inflammatory nature could produce similar findings. Percutaneous drain with tip over the right lower quadrant. Persistent mild rectal wall thickening with significantly improved fecal retention over the rectum. Worsening patchy bilateral airspace opacification within the lung bases right worse than left which may be due to atelectasis or infection. Small right pleural effusion. Enteric tube with tip just below the gastroesophageal junction. Electronically Signed   By: Elberta Fortis M.D.   On: 10/01/2017 16:02   Ct Abdomen Pelvis W Contrast  Result Date: 09/22/2017 CLINICAL DATA:  Recent admission for megacolon. Now unable to eat solids with severely distended abdomen. Shortness of breath. EXAM: CT ANGIOGRAPHY CHEST CT ABDOMEN AND PELVIS WITH CONTRAST TECHNIQUE: Multidetector CT imaging of the chest was performed using the standard protocol during bolus administration of intravenous contrast. Multiplanar CT image reconstructions and MIPs were obtained to evaluate the vascular anatomy. Multidetector CT imaging of the abdomen and pelvis was performed using the standard protocol during bolus administration of intravenous contrast. CONTRAST:  ISOVUE-370 IOPAMIDOL (ISOVUE-370) INJECTION 76% COMPARISON:  CT abdomen and pelvis 09/15/2017. FINDINGS: CTA CHEST FINDINGS Cardiovascular: Motion artifact limits examination. There is good opacification of the central and segmental pulmonary arteries. Filling defects are demonstrated in the distal main and lower lobe pulmonary arteries bilaterally consistent with bilateral pulmonary emboli. Normal caliber thoracic aorta. Great vessel origins are patent. Normal heart size. Small pericardial effusion. Normal RV to LV ratio at 0.8. Mediastinum/Nodes: No significant lymphadenopathy. Esophagus is decompressed. Lungs/Pleura: Emphysematous changes and fibrosis in  both lungs. Fluid or thickened pleura in the fissures. Patchy areas of infiltration or consolidation in both lungs could represent pneumonia or infarcts. No pleural effusions. No pneumothorax. Airways are patent. Musculoskeletal: No chest wall abnormality. No acute or significant osseous findings. Review of the MIP images confirms the above findings. CT ABDOMEN and PELVIS FINDINGS Hepatobiliary: No focal liver lesions. Gallbladder is mildly dilated with probable sludge. No wall thickening. Bile ducts are not dilated. Pancreas: Unremarkable. No pancreatic ductal dilatation or surrounding inflammatory changes. Spleen: Normal in size without focal abnormality. Adrenals/Urinary Tract: Adrenal glands are unremarkable. Kidneys are normal, without renal calculi, focal lesion, or hydronephrosis. Bladder is thick-walled, suggesting cystitis. Stomach/Bowel: There is massive distention of the colon throughout. The colon is filled with gas and stool. Large amount of stool in the rectum. Rectal diameter measures up to 17 cm. Rectal wall thickening may indicate stercoral colitis. Small bowel and stomach are decompressed. Vascular/Lymphatic: No significant vascular findings are present. No enlarged abdominal or pelvic lymph nodes. Prominent bilateral groin lymph nodes of nonspecific etiology, probably reactive. Reproductive: Prostate is unremarkable. Other: No free air or free fluid in the abdomen. Edema in the subcutaneous fat. Musculoskeletal: No acute or significant osseous findings. Review of the MIP images confirms the above findings. IMPRESSION: 1. Positive examination for bilateral pulmonary emboli. No evidence of right heart strain. 2. Diffuse emphysematous changes in the lungs. Patchy airspace disease may represent pneumonia or infarcts. 3. Massive distention of the colon throughout. Colon is filled with gas and mostly stool. Large amount of stool in the rectum with rectal wall thickening may indicate stercoral colitis.  Emphysema (ICD10-J43.9). These results were called by telephone at the time of interpretation on 09/22/2017 at 1:07 am to Dr. Cheron Schaumann , who verbally acknowledged these results. Electronically Signed   By: Burman Nieves M.D.   On: 09/22/2017 01:07   Ct Abdomen Pelvis W Contrast  Result Date: 09/15/2017 CLINICAL DATA:  Abnormal bowel movements for 3 weeks. Abdominal distension. Abdominal discomfort. EXAM: CT ABDOMEN AND PELVIS WITH CONTRAST TECHNIQUE: Multidetector CT imaging of the abdomen and pelvis was performed using the standard protocol following bolus administration of intravenous contrast. CONTRAST:  ISOVUE-300 IOPAMIDOL (ISOVUE-300) INJECTION 61% COMPARISON:  None. FINDINGS: Lower chest: No acute findings. Hepatobiliary: No focal liver abnormality is seen. No gallstones, gallbladder wall thickening, or biliary dilatation. Pancreas: Unremarkable. No pancreatic ductal dilatation or surrounding inflammatory changes. Spleen: Normal in size without focal abnormality. Adrenals/Urinary Tract: Adrenal glands are unremarkable. Kidneys are normal, without renal calculi,  focal lesion, or hydronephrosis. Bladder is displaced anteriorly. Stomach/Bowel: Massive distention of the rectal vault by stool. Additional severe distension of the remainder of the colon with stool. No dilated small bowel loops seen. Vascular/Lymphatic: No significant vascular findings are present. No enlarged abdominal or pelvic lymph nodes. Mildly prominent lymph nodes within the bilateral inguinal regions. Reproductive: No obvious abnormality. Other: No free fluid or abscess collection identified. No free intraperitoneal air. Musculoskeletal: No acute findings. Bilateral chronic pars interarticularis defects at the L5-S1 level with associated grade 1 spondylolisthesis. IMPRESSION: 1. Massive distension of the rectal vault and rectosigmoid colon by mostly stool and some gas, compatible with impaction and/or severe constipation.  Additional fairly marked distension of the remainder of the colon, filled with stool throughout, again compatible with impaction and/or severe constipation. 2. No small bowel dilatation appreciated. 3. Majority of the intra-abdominal organs are displaced to some degree by mass effect from the distended colon. Bladder is displaced anteriorly by the massively distended rectosigmoid colon. Electronically Signed   By: Bary Richard M.D.   On: 09/15/2017 19:58   Ir Ivc Filter Plmt / S&i /img Guid/mod Sed  Result Date: 10/02/2017 INDICATION: History of pulmonary embolism, initiated on anti coagulation, now with uncontrolled bleeding associated with a surgical drain. As such, request made for placement an IVC filter for caval interruption purposes. Given the patient's multiple medical comorbidities, the filter will be considered a permanent device and the patient will not be actively followed by the interventional radiology service for retrieval. EXAM: ULTRASOUND GUIDANCE FOR VASCULAR ACCESS IVC CATHETERIZATION AND VENOGRAM IVC FILTER INSERTION COMPARISON:  CT abdomen and pelvis - 09/21/2017; chest CT - 09/22/2017 MEDICATIONS: None. ANESTHESIA/SEDATION: Fentanyl 100 mcg IV; Versed 12 mg IV Sedation Time: 12 minutes; The patient was continuously monitored during the procedure by the interventional radiology nurse under my direct supervision. CONTRAST:  40 cc Isovue-300 FLUOROSCOPY TIME:  1 minute 6 seconds (46.6 mGy) COMPLICATIONS: None immediate PROCEDURE: Informed consent was obtained from the patient's family following explanation of the procedure, risks, benefits and alternatives. The patient understands, agrees and consents for the procedure. All questions were addressed. A time out was performed prior to the initiation of the procedure. Maximal barrier sterile technique utilized including caps, mask, sterile gowns, sterile gloves, large sterile drape, hand hygiene, and Betadine prep. Under sterile condition and  local anesthesia, right internal jugular venous access was performed with ultrasound. An ultrasound image was saved and sent to PACS. Over a guidewire, the IVC filter delivery sheath and inner dilator were advanced into the IVC just above the IVC bifurcation. Contrast injection was performed for an IVC venogram. Through the delivery sheath, a retrievable [place] IVC filter was deployed below the level of the renal veins and above the IVC bifurcation. Limited post deployment venacavagram was performed. The delivery sheath was removed and hemostasis was obtained with manual compression. A dressing was placed. The patient tolerated the procedure well without immediate post procedural complication. FINDINGS: The IVC is patent. No evidence of thrombus, stenosis, or occlusion. No variant venous anatomy. Successful placement of the IVC filter below the level of the renal veins. IMPRESSION: Successful ultrasound and fluoroscopically guided placement of an infrarenal retrievable IVC filter via right jugular approach. PLAN: Due to patient related comorbidities and/or clinical necessity, this IVC filter should be considered a permanent device. This patient will not be actively followed for future filter retrieval. Electronically Signed   By: Simonne Come M.D.   On: 10/02/2017 13:16   US Venous Img  Lower Bilateral  Result Date: 09/16/2017 CLINICAL DATA:  Bilateral lower extremity edema for 2 months EXAM: BILATERAL LOWER EXTREMITY VENOUS DUPLEX ULTRASOUND TECHNIQUE: Doppler venous assessment of the bilateral lower extremity deep venous system was performed, including characterization of spectral flow, compressibility, and phasicity. COMPARISON:  None. FINDINGS: There is complete compressibility of the bilateral common femoral, femoral, and popliteal veins. Doppler analysis demonstrates respiratory phasicity and augmentation of flow with calf compression. No obvious superficial vein or calf vein thrombosis. IMPRESSION: No  evidence of lower extremity DVT. Electronically Signed   By: Jolaine Click M.D.   On: 09/16/2017 12:45   Dg Chest Port 1 View  Result Date: 10/11/2017 CLINICAL DATA:  Acute respiratory failure.  Hypoxia. EXAM: PORTABLE CHEST 1 VIEW COMPARISON:  10/10/2017. FINDINGS: Tracheostomy tube, feeding tube, NG tube, right PICC line in unchanged position. Heart size normal. Right base atelectatic changes and consolidation again noted. Atelectasis. No pleural effusion or pneumothorax. IMPRESSION: 1.  Lines and tubes stable position. 2. Right base atelectatic changes and consolidation again noted. Continued follow-up exam to demonstrate resolution. Electronically Signed   By: Maisie Fus  Register   On: 10/11/2017 10:06   Dg Chest Port 1 View  Result Date: 10/10/2017 CLINICAL DATA:  Respiratory failure and shortness of breath. EXAM: PORTABLE CHEST 1 VIEW COMPARISON:  10/16/2016. FINDINGS: Nasogastric and feeding tubes extend beyond the inferior aspect of the film. right-sided PICC line terminates at low SVC. Tracheostomy appropriately position. Normal heart size. Possible small right pleural effusion. No pneumothorax. Clear left lung. Interval development of collapse/consolidative change in the right lower lobe with volume loss. IMPRESSION: Right lower lobe collapse/consolidative change. Given volume loss, at least partially felt to represent atelectasis. Infection or aspiration cannot be excluded. Electronically Signed   By: Jeronimo Greaves M.D.   On: 10/10/2017 07:38   Dg Chest Port 1 View  Result Date: 10/07/2017 CLINICAL DATA:  Respiratory failure EXAM: PORTABLE CHEST 1 VIEW COMPARISON:  10/07/2016 FINDINGS: Tracheostomy tube and feeding catheter are again noted and stable. Right-sided PICC line is noted at the cavoatrial junction. Cardiac shadow is within normal limits. The lungs are well aerated bilaterally. Improved aeration in the right base is noted although mild residual atelectasis remains. IMPRESSION: Improving  aeration in the right base. The remainder of the exam is stable from the previous day. Electronically Signed   By: Alcide Clever M.D.   On: 10/07/2017 07:13   Dg Chest Port 1 View  Result Date: 10/06/2017 CLINICAL DATA:  Respiratory failure. EXAM: PORTABLE CHEST 1 VIEW COMPARISON:  10/05/2017. FINDINGS: Tracheostomy tube, feeding tube, right PICC line in stable position. Heart size normal. Persistent but improved right base atelectasis. No pleural effusion or pneumothorax. IMPRESSION: 1.  Lines and tubes in stable position. 2.  Persistent but improved right base atelectasis. Electronically Signed   By: Maisie Fus  Register   On: 10/06/2017 09:30   Dg Chest Port 1 View  Result Date: 10/05/2017 CLINICAL DATA:  Status post bronchoscopy. EXAM: PORTABLE CHEST 1 VIEW COMPARISON:  Chest radiograph October 05, 2017 FINDINGS: Tracheostomy tube tip projects 5.8 cm above the carina. RIGHT PICC distal tip projects in distal superior vena cava. Feeding tube past distal stomach, Multiple EKG lines overlie the patient and may obscure subtle underlying pathology. RIGHT middle lobe collapse with similarly elevated RIGHT hemidiaphragm. Mild RIGHT upper lobe atelectasis. Hyper inflated LEFT lung. No focal consolidation. No pneumothorax. Midline laparotomy staples. IMPRESSION: 1. New RIGHT middle lobe collapse. Improved aeration RIGHT upper lobe with mild atelectasis. 2. New  feeding tube past distal stomach, otherwise stable appearance of life support lines. Electronically Signed   By: Awilda Metro M.D.   On: 10/05/2017 16:37   Dg Chest Port 1 View  Result Date: 10/05/2017 CLINICAL DATA:  New tracheostomy. History of cardiomyopathy and polysubstance abuse. EXAM: PORTABLE CHEST 1 VIEW COMPARISON:  10/05/2017 and 10/04/2017. FINDINGS: 1359 hours. Interval tracheostomy. The tracheostomy tube appears well positioned. Enteric tube projects below the diaphragm, tip in proximal stomach. There is a right arm PICC which projects  to the level of the upper right atrium, stable. There is new right upper lobe collapse with associated volume loss. The left lung is clear. There is no pneumothorax or significant pleural effusion. The heart size and mediastinal contours are stable. IMPRESSION: 1. Tracheostomy appears well positioned. The additional components of the support system are stable. 2. New right upper lobe collapse.  No pneumothorax. Electronically Signed   By: Carey Bullocks M.D.   On: 10/05/2017 14:33   Dg Chest Port 1 View  Result Date: 10/05/2017 CLINICAL DATA:  Hypoxia EXAM: PORTABLE CHEST 1 VIEW COMPARISON:  October 04, 2017 FINDINGS: Endotracheal tube tip is 3.7 cm above the carina. Central catheter tip is at the cavoatrial junction. Nasogastric tube tip is in the gastric cardia region. Side port is above the diaphragm. No pneumothorax. There is mild medial right base atelectasis. No edema or consolidation. Heart size and pulmonary vascular normal. No adenopathy. No bone lesions. IMPRESSION: Tube and catheter positions as described without pneumothorax. Note that the nasogastric tube side port is above the diaphragm. Advise advancing nasogastric tube approximately the 8 cm to insure placement of both tube tip and side port below the diaphragm. Mild medial right base atelectasis. No edema or consolidation. Stable cardiac silhouette. Electronically Signed   By: Bretta Bang III M.D.   On: 10/05/2017 07:23   Dg Chest Port 1 View  Result Date: 10/04/2017 CLINICAL DATA:  Respiratory failure EXAM: PORTABLE CHEST 1 VIEW COMPARISON:  Two days ago FINDINGS: Endotracheal tube tip between the clavicular heads and carina. An orogastric tube tip reaches the GE junction. Side port is at the lower esophagus. Right upper extremity PICC with tip at the upper cavoatrial junction. The lungs are clear. Normal heart size IMPRESSION: 1. No evidence of active cardiopulmonary disease. 2. Orogastric tube tip at the GE junction and side-port  at the lower esophagus. The stomach is moderately distended by gas. Electronically Signed   By: Marnee Spring M.D.   On: 10/04/2017 09:06   Dg Chest Port 1 View  Result Date: 10/02/2017 CLINICAL DATA:  Acute respiratory failure with hypoxia EXAM: PORTABLE CHEST 1 VIEW COMPARISON:  Chest radiograph dated 10/01/2017. CTA chest dated 09/22/2017. FINDINGS: Right pleural effusion has improved/resolved. Focal patchy opacity in the lateral right lower lung, corresponding to the inferior right upper lobe on CT, where this appearance may suggest sequela of pulmonary infarct. Left lung is clear. No frank interstitial edema. No pneumothorax. The heart is normal in size. Endotracheal tube terminates 4.5 cm above the carina. Right arm PICC terminates in the upper right atrium. Enteric tube courses into the gastric cardia with its side port at the GE junction. IMPRESSION: Endotracheal tube terminates 4.5 cm above the carina. Additional support apparatus as above. Right pleural effusion is improved/resolved. Focal patchy opacity in the lateral right lower lung, corresponding to the inferior right upper lobe on CT, where this appearance may suggest sequela of pulmonary infarct. Follow-up is suggested to document resolution. Electronically Signed  By: Charline Bills M.D.   On: 10/02/2017 07:42   Portable Chest Xray  Result Date: 10/01/2017 CLINICAL DATA:  27 year old male status post endotracheal tube placement. EXAM: PORTABLE CHEST 1 VIEW COMPARISON:  Chest x-ray a 09/30/2017. FINDINGS: There is a right upper extremity PICC with tip terminating in the right atrium. An endotracheal tube is in place with tip 3.6 cm above the carina. A nasogastric tube is seen extending into the stomach, however, the tip of the nasogastric tube extends below the lower margin of the image. Airspace consolidation in the right middle and lower lobes. Small right pleural effusion. Probable subsegmental atelectasis in the medial aspect of the  left lower lobe. No definite left pleural effusion. No evidence of pulmonary edema. Heart size is normal. Upper mediastinal contours are within normal limits. IMPRESSION: 1. Support apparatus, as above. 2. Worsening airspace consolidation in the right middle and lower lobes concerning for pneumonia. Small right parapneumonic pleural effusion. Electronically Signed   By: Trudie Reed M.D.   On: 10/01/2017 07:16   Dg Chest Port 1 View  Result Date: 09/30/2017 CLINICAL DATA:  Intubation.  Cardiomyopathy. EXAM: PORTABLE CHEST 1 VIEW COMPARISON:  09/29/2017 FINDINGS: Endotracheal tube in satisfactory position. Enteric catheter has been partially retracted and the side hole is superior to the expected location of the GE junction. Right PICC line in stable position. Cardiomediastinal silhouette is normal. Mediastinal contours appear intact. No evidence of pneumothorax. Linear bilateral airspace opacities in the lower lobes. Improved right pleural effusion. Osseous structures are without acute abnormality. Soft tissues are grossly normal. IMPRESSION: Status post intubation. Enteric catheter has been partially retracted and the side hole is superior to the expected location of the GE junction. Readjustment recommended. Bilateral linear lower lobe airspace opacities may represent atelectasis or consolidation. Improved right pleural effusion. Electronically Signed   By: Ted Mcalpine M.D.   On: 09/30/2017 11:06   Dg Chest Port 1 View  Result Date: 09/29/2017 CLINICAL DATA:  History of cardiomyopathy EXAM: PORTABLE CHEST 1 VIEW COMPARISON:  09/29/2017, 09/28/2017, 09/21/2017, CT 09/22/2017 FINDINGS: Esophageal tube tip appears folded back upon itself in the region of the stomach. Endotracheal tube is removed. Right upper extremity catheter tip overlies the right atrium. Increased right pleural effusion and basilar airspace disease. Stable cardiomediastinal silhouette. No pneumothorax. IMPRESSION: 1. Removal of  endotracheal tube. Esophageal tube tip appears folded back upon itself in the region of the proximal stomach 2. Development of small right pleural effusion with worsened right basilar consolidation and mild volume loss/shift to the right Electronically Signed   By: Jasmine Pang M.D.   On: 09/29/2017 22:06   Dg Chest Port 1 View  Result Date: 09/29/2017 CLINICAL DATA:  Acute respiratory failure, hypoxia EXAM: PORTABLE CHEST 1 VIEW COMPARISON:  Chest x-ray of 09/28/2017 and CT chest of 09/22/2017 FINDINGS: The tip of the endotracheal tube is approximately 3.8 cm above the carina. Right PICC line tip overlies the expected right atrium. NG tube extends into the stomach. There is mild linear atelectasis at the lung bases, and minimal opacity in the medial left upper lung possibly residual of patchy opacities noted by CT chest. Pneumonia cannot be excluded. No effusion is seen. Mild cardiomegaly is stable. IMPRESSION: 1. Tip of endotracheal tube approximately 3.8 cm above the carina. 2. Minimal opacity medially in the left upper lobe may be residual of prior patchy opacities noted by CT of the chest. Possible area of pneumonia. 3. Mild basilar atelectasis. Electronically Signed   By: Renae Fickle  Gery Pray M.D.   On: 09/29/2017 09:50   Portable Chest Xray  Result Date: 09/28/2017 CLINICAL DATA:  Hypoxia EXAM: PORTABLE CHEST 1 VIEW COMPARISON:  September 27, 2017 FINDINGS: Endotracheal tube tip is 3.7 cm above the carina. Nasogastric tube tip and side port are in stomach. No pneumothorax. There is atelectatic change in the left base. There is subtle airspace opacity in the right base, slightly less than 1 day prior. No new opacity evident. Heart is borderline prominent with pulmonary vascularity normal. No adenopathy. No bone lesions. IMPRESSION: Tube positions as described without evident pneumothorax. Partial clearing focal infiltrate right base. Atelectatic change left base. No new opacity. Stable cardiac silhouette.  Electronically Signed   By: Bretta Bang III M.D.   On: 09/28/2017 07:25   Portable Chest X-ray  Result Date: 09/27/2017 CLINICAL DATA:  27 year old male currently intubated EXAM: PORTABLE CHEST 1 VIEW COMPARISON:  Chest x-ray obtained earlier today FINDINGS: The tip of the endotracheal tube is 4.1 cm above the carina. A nasogastric tube is present. The tip overlies the gastric fundus. Patchy bibasilar airspace opacities more prominent on the right than the left. Overall, inspiratory volumes are improved. No evidence of pneumothorax or large pleural effusion. No acute osseous abnormality. IMPRESSION: 1. Patchy airspace opacities in the right greater than left base which may reflect atelectasis or multifocal pneumonia. 2. Interval intubation. The tip of the endotracheal tube is 4.1 cm above the carina. 3. Improved lung volumes bilaterally following intubation. 4. Well-positioned gastric tube. Electronically Signed   By: Malachy Moan M.D.   On: 09/27/2017 15:40   Dg Chest Portable 1 View  Result Date: 09/21/2017 CLINICAL DATA:  Shortness of breath EXAM: PORTABLE CHEST 1 VIEW COMPARISON:  08/18/2017 08/27/2013 FINDINGS: Low lung volumes. Subsegmental atelectasis left base. Increasing opacity at the right base. Stable cardiomediastinal silhouette. No pneumothorax. IMPRESSION: Low lung volumes with atelectasis at the left base. Increasing airspace disease at the right base, atelectasis versus mild infiltrate. Electronically Signed   By: Jasmine Pang M.D.   On: 09/21/2017 23:28   Dg Abd Acute W/chest  Result Date: 09/27/2017 CLINICAL DATA:  Nausea and vomiting EXAM: DG ABDOMEN ACUTE W/ 1V CHEST COMPARISON:  Chest two-view 09/21/2017.  Abdomen 09/21/2017 FINDINGS: Bibasilar airspace disease unchanged. This may represent atelectasis or pneumonia. No effusion. Decreased lung volume. NG tube in the stomach. Very large amount of stool throughout the colon similar to 09/22/2014. Large amount of stool in  the rectum. No bowel obstruction or free air. The colon is distended and filled with stool diffusely. IMPRESSION: Hypoventilation.  Bibasilar atelectasis/infiltrate unchanged Extremely large amount of stool throughout the colon. Colon diffusely distended similar to prior studies. Electronically Signed   By: Marlan Palau M.D.   On: 09/27/2017 07:34   [place] Ekg Site Rite  Result Date: 09/28/2017 If Site Rite image not attached, placement could not be confirmed due to current cardiac rhythm.  Dg Or Local Abdomen  Result Date: 09/27/2017 CLINICAL DATA:  Concern for retained surgical instrument EXAM: OR LOCAL ABDOMEN COMPARISON:  None. FINDINGS: Surgical drain present with tip in right lower quadrant. Skin staples present. No retained surgical instrument evident. There is an apparent right lower quadrant ostomy. There is no bowel dilatation or air-fluid level. There is free air consistent with surgery. IMPRESSION: No retained surgical instrument. Skin staples and surgical drain present. There is pneumoperitoneum. No bowel obstruction evident. Critical Value/emergent results were called by telephone at the time of interpretation on 09/27/2017 at 1:41 pm to [facility]  Bernerd Pho, RN, who verbally acknowledged these results. Electronically Signed   By: Bretta Bang III M.D.   On: 09/27/2017 13:41   Dg Colon W/water Sol Cm  Result Date: 09/18/2017 CLINICAL DATA:  Obstipation and severe fecal impaction. Therapeutic Gastrografin enema requested. EXAM: COLON WITH WATER SOLUTION CONTRAST COMPARISON:  CT abdomen pelvis dated September 15, 2017. FLUOROSCOPY TIME:  Radiation Exposure Index (as provided by the fluoroscopic device): 81.1 mGy. If the device does not provide the exposure index: Fluoroscopy Time:  1 minutes, 30 seconds Number of Acquired Images:  11 FINDINGS: Preliminary KUB demonstrates massive colorectal distention with stool, similar to recent CT. An enema catheter was inserted and retention balloon distended under  fluoroscopy. Gastrografin was then introduced and only could be advanced to the descending colon despite repositioning the patient. Gastrografin contrast outlines the normal appearing mucosa of the rectosigmoid and descending colon. Post evacuation x-rays demonstrate significant residual stool throughout the colon. IMPRESSION: 1. Technically successful therapeutic Gastrografin enema. Contrast only reached the proximal descending colon. Significant residual stool post evacuation. Electronically Signed   By: Obie Dredge M.D.   On: 09/18/2017 15:35    Labs:  CBC: Recent Labs    10/12/17 0500 10/13/17 0500 10/14/17 0415 10/15/17 0434  WBC 10.9* 12.4* 12.2* 8.9  HGB 11.2* 12.7* 12.0* 13.1  HCT 36.0* 41.0 38.4* 41.7  PLT 305 384 317 342    COAGS: Recent Labs    09/18/17 0953 09/21/17 2237 09/26/17 0626 10/02/17 1030  INR 1.09 1.10 1.26 1.02  APTT 33 37*  --   --     BMP: Recent Labs    10/12/17 0500 10/13/17 0500 10/14/17 1219 10/15/17 0434  NA 136 134* 136 138  K 4.5 5.1 4.4 4.3  CL 99 99 100 97*  CO2 26 26 26 31   GLUCOSE 109* 240* 151* 107*  BUN 18 16 16 19   CALCIUM 8.8* 9.2 9.3 10.2  CREATININE 0.46* 0.54* 0.56* 0.63  GFRNONAA >60 >60 >60 >60  GFRAA >60 >60 >60 >60    LIVER FUNCTION TESTS: Recent Labs    10/10/17 0343 10/11/17 0557 10/13/17 0500 10/15/17 0434  BILITOT 0.4 0.4 0.3 0.5  AST 30 20 26 24   ALT 63* 50* 46* 37  ALKPHOS 98 116 158* 187*  PROT 6.4* 6.3* 6.9 8.1  ALBUMIN 2.2* 2.3* 2.5* 2.9*    TUMOR MARKERS: No results for input(s): AFPTM, CEA, CA199, CHROMGRNA in the last 8760 hours.  Assessment and Plan: H/O fecal impaction, s/p colectomy with end ileostomy.  Now with gastric atony.  IR consulted for G-J tube placement at the request of [facility].  Patient case reviewed by Dr. Bonnielee Haff who plans to place G-tube first, then J extension later.  Patient resting comfortably with trach in place on room air.  Will plan for early next week as schedule  allows.  Order placed for NPO and no thinners Sunday evening.   Risks and benefits discussed with the patient including, but not limited to the need for a barium enema during the procedure, bleeding, infection, peritonitis, or damage to adjacent structures.  All of the patient's questions were answered, patient is agreeable to proceed. Consent signed and in chart.  Thank you for this interesting consult.  I greatly enjoyed meeting Daviyon L Abdallah and look forward to participating in their care.  A copy of this report was sent to the requesting provider on this date.  Electronically Signed: Hoyt Koch, PA 10/15/2017, 2:01 PM   I spent a  total of 40 Minutes    in face to face in clinical consultation, greater than 50% of which was counseling/coordinating care for gastric atony.

## 2017-10-15 NOTE — Progress Notes (Signed)
Pt w/ 1350 out NG since 0700. Paged covering Gen. Surg MD, Dr.Tsuei who states this is not a new issue. No changes for now.

## 2017-10-15 NOTE — Progress Notes (Signed)
[facility] TEAM 1 - Stepdown/ICU TEAM  Philip Thompson  HBZ:[id number] DOB: Mar 04, 1990 DOA: 09/21/2017 PCP: [name], MD    Brief Narrative:  27 year old male with a hx of polysubstance abuse, major depression, anxiety, chronic constipation, and hypothyroidism who presented to the ED w/ severe abdominal pain and distention with nausea and vomiting.  In the ED he was found to have colonic inertia/megacolon secondary to constipation, along with a pulmonary embolism.   Significant Events: 9/04 admit 9/10 total colectomy and ileostomy > massive distended colon from cecum all the way to the distal rectum and pelvic ring  9/10 intubation, unable to extubate 9/11 PICC line 9/15 placed retrievable IVC filter due to JP drain bleeding while on anti-coagulation 9/18 Shiley trach cuffed 9/22 started on Precedex for agitation, successfully weaned off on 9/23 9/23 JP drain out   Subjective: The pt is alert and conversant.  He denies anxiety.  He c/o back pain related to laying in bed.  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 Ongoing care per Gen Surgery - gastric atony - NG to suction w/ post-pyloric feeding - awaiting placement of G-J tube per IR   Acute hypoxic respiratory failure > tracheostomy secondary to PE, mucous plugging, and abdom compartment syndrome (initially) - resp care per PCCM - possible trach decannulation in 5-6 days per PCCM - doing well off vent   Provoked PE/DVT  Due to bowel obstruction/abdominal compartment syndrome - initial anticoagulation was held due to bleed on 9/14 - s/p retrievable filter 9/15 - currently on full dose Lovenox - IR recommending to retrieve filter in 4 weeks (~10/15)  Persistent Sinus Tachycardia Timing is too late for withdrawal - likely due to deconditioning + PE + anxiety - was not previously on BB - TSH 3.1 - Mg is  normal - check serum cortisol - dose w/ BB as BP will allow   Sepsis due to Staph aureus and E. Coli in BAL To complete 2 more days of abx tx   Anxiety/depression - Hx substance abuse on multiple psych meds - Psychiatry consulted and meds adjusted - affect is normal/cheerful   Hypothyroidism continue Synthroid  Severe malnutrition in context of chronic illness on postpyloric tube feeding per Gen Surgery   DVT prophylaxis: lovenox  Code Status: FULL CODE Family Communication: spoke w/ family at bedside  Disposition Plan: awaiting CIR bed - medically ready for CIR   Consultants:   General surgery  IR  PCCM  GI   Psychiatry  Antimicrobials:  Cefazolin 9/22 >   Objective: Blood pressure 118/82, pulse (!) 123, temperature 98.2 F (36.8 C), temperature source Oral, resp. rate 18, height 5' 7.99" (1.727 m), weight 61.6 kg, SpO2 92 %.  Intake/Output Summary (Last 24 hours) at 10/15/2017 1011 Last data filed at 10/15/2017 0900 Gross per 24 hour  Intake 2445.67 ml  Output 6975 ml  Net -4529.33 ml   Filed Weights   10/13/17 0500 10/14/17 0500 10/15/17 0500  Weight: 66 kg 64.5 kg 61.6 kg    Examination: General: No acute respiratory distress - copious trach secretions  Lungs: Clear to auscultation bilaterally - no wheeze  Cardiovascular: tachycardic - regular - no M or rub  Abdomen: Nontender, nondistended, soft, BS+, well healed midline abdom incision - R LQ ostomy in place  Extremities: No signif edema bilateral lower extremities  CBC:  Recent Labs  Lab 10/11/17 0557  10/13/17 0500 10/14/17 0415 10/15/17 0434  WBC 13.0*   < > 12.4* 12.2* 8.9  NEUTROABS 9.6*  --   --   --   --   HGB 11.1*   < > 12.7* 12.0* 13.1  HCT 35.8*   < > 41.0 38.4* 41.7  MCV 90.9   < > 90.5 90.8 90.3  PLT 318   < > 384 317 342   < > = values in this interval not displayed.   Basic Metabolic Panel: Recent Labs  Lab 10/12/17 0500 10/13/17 0500 10/14/17 1219 10/15/17 0434  NA  136 134* 136 138  K 4.5 5.1 4.4 4.3  CL 99 99 100 97*  CO2 _0 GLUCOSE 109* 240* 151* 107*  BUN _1 CREATININE 0.46* 0.54* 0.56* 0.63  CALCIUM 8.8* 9.2 9.3 10.2  MG 1.9 2.1  --  2.0  PHOS 3.5 4.4  --  4.8*   GFR: Estimated Creatinine Clearance: 120.8 mL/min (by C-G formula based on SCr of 0.63 mg/dL).  Liver Function Tests: Recent Labs  Lab 10/10/17 0343 10/11/17 0557 10/13/17 0500 10/15/17 0434  AST _2 ALT 63* 50* 46* 37  ALKPHOS 98 116 158* 187*  BILITOT 0.4 0.4 0.3 0.5  PROT 6.4* 6.3* 6.9 8.1  ALBUMIN 2.2* 2.3* 2.5* 2.9*    HbA1C: Hgb A1c MFr Bld  Date/Time Value Ref Range Status  03/27/2017 06:54 AM 5.4 4.8 - 5.6 % Final    Comment:    (NOTE) Pre diabetes:          5.7%-6.4% Diabetes:              >6.4% Glycemic control for   <7.0% adults with diabetes   07/02/2016 07:34 AM 5.4 4.8 - 5.6 % Final    Comment:    (NOTE)         Pre-diabetes: 5.7 - 6.4         Diabetes: >6.4         Glycemic control for adults with diabetes: <7.0     CBG: Recent Labs  Lab 10/14/17 1617 10/14/17 1948 10/14/17 2351 10/15/17 0410 10/15/17 0743  GLUCAP 108* 98 93 92 113*    Recent Results (from the past 240 hour(s))  Culture, bal-quantitative     Status: Abnormal (Preliminary result)   Collection Time: 10/05/17  3:40 PM  Result Value Ref Range Status   Specimen Description BRONCHIAL ALVEOLAR LAVAGE  Final   Special Requests NONE  Final   Gram Stain   Final    FEW WBC PRESENT, PREDOMINANTLY PMN RARE GRAM POSITIVE COCCI    Culture (A)  Final    30,000 COLONIES/mL STAPHYLOCOCCUS AUREUS 20,000 COLONIES/mL ESCHERICHIA COLI ORGANISM 1 Sent to [place] for further susceptibility testing. Performed at [facility], [place] [address] [place], [place] [address]    Report Status PENDING  Incomplete   Organism ID, Bacteria ESCHERICHIA COLI (A)  Final      Susceptibility   Escherichia coli - MIC*    AMPICILLIN <=2 SENSITIVE Sensitive      CEFAZOLIN <=4 SENSITIVE Sensitive     CEFEPIME <=1 SENSITIVE Sensitive     CEFTAZIDIME <=1 SENSITIVE Sensitive     CEFTRIAXONE <=1 SENSITIVE Sensitive     CIPROFLOXACIN <=0.25 SENSITIVE Sensitive     GENTAMICIN <=1 SENSITIVE Sensitive     IMIPENEM <=0.25 SENSITIVE Sensitive     TRIMETH/SULFA <=20  SENSITIVE Sensitive     AMPICILLIN/SULBACTAM <=2 SENSITIVE Sensitive     PIP/TAZO <=4 SENSITIVE Sensitive     Extended ESBL NEGATIVE Sensitive     * 20,000 COLONIES/mL ESCHERICHIA COLI  Susceptibility, Aer + Anaerob     Status: None   Collection Time: 10/05/17  4:00 PM  Result Value Ref Range Status   Suscept, Aer + Anaerob Preliminary report  Final    Comment: (NOTE) Performed At: [facility] [place], [place] [id number] Rush Farmer MD [id number]    Source of Sample BRONCHIAL ALVEOLAR LAVAGE  Final    Comment: Performed at [facility], [place] [address], [place], [place] [address]  Susceptibility Result     Status: None (Preliminary result)   Collection Time: 10/05/17  4:00 PM  Result Value Ref Range Status   Suscept Result 1 Staphylococcus aureus  Final    Comment: (NOTE) Identification performed by account, not confirmed by this laboratory. Performed At: [facility] [place], [place] [id number] Rush Farmer MD [id number]    Antimicrobial Suscept PENDING  Incomplete     Scheduled Meds: . busPIRone  10 mg Per Tube BID  . chlorhexidine  15 mL Mouth Rinse BID  . Chlorhexidine Gluconate Cloth  6 each Topical Daily  . citalopram  20 mg Per Tube Daily  . clonazePAM  1 mg Per Tube Q8H  . enoxaparin (LOVENOX) injection  70 mg Subcutaneous Q12H  . famotidine  20 mg Per Tube BID  . free water  250 mL Per Tube Q6H  . gabapentin  300 mg Per Tube Q12H  . levalbuterol  0.63 mg Nebulization Q8H  . levothyroxine  75 mcg Per Tube QAC breakfast  . mouth rinse  15 mL Mouth Rinse q12n4p  . metoCLOPramide (REGLAN) injection  10 mg  Intravenous Q6H  . nicotine  21 mg Transdermal Daily  . oxyCODONE  5 mg Per Tube Q6H  . QUEtiapine  200 mg Per Tube QHS  . sodium chloride flush  10-40 mL Intracatheter Q12H  . sodium chloride flush  3 mL Intravenous Q12H  . traZODone  100 mg Oral QHS  . valproic acid  500 mg Per Tube BID   Continuous Infusions: .  ceFAZolin (ANCEF) IV 2 g (10/15/17 0907)  . dextrose 5 % and 0.45% NaCl 10 mL/hr at 10/14/17 1300  . feeding supplement (VITAL AF 1.2 CAL) 1,000 mL (10/15/17 0400)     LOS: 23 days   Cherene Altes, MD [facility] Office  [phone] Pager - Text Page per Amion  If 7PM-7AM, please contact night-coverage per Amion 10/15/2017, 10:11 AM

## 2017-10-15 NOTE — Progress Notes (Signed)
Physical Therapy Treatment Patient Details Name: Philip Thompson MRN: [id number] DOB: 26-Oct-1990 Today's Date: 10/15/2017    History of Present Illness 27 y/o male admitted with colonic inertia/megacolon secondary to constipation and bilateral pulmonary emboli and DVT due to pelvic vein obstruction.  He had acute aspiration pneumonia on 9/10 prior to surgery in the setting of nausea/vomiting.  He went to the OR on 9/10 for total colectomy and ileostomy.  Extubated 9/12, Reintubated 9/13. 9/18 Trached and Cortrak placed PMH Anxiety/depression, hx of substance abuse disorder    PT Comments    Pt is progressing quickly with gait, walking a good distance down the hallway, outside and back to his room.  At times his O2 sats would drop as low as 85% on 35% TC, but that was usually when he was trying to walk and talk, once silent and with a standing rest break he would recover back to the 90s.  HR 120s during gait. He really likes to walk and spend a few minutes outside.  He may be close to ready to try to start gait training without RW (cane vs hand held assist).  PT will continue to follow acutely for safe mobility progression     Follow Up Recommendations  CIR     Equipment Recommendations  Rolling walker with 5" wheels    Recommendations for Other Services   NA     Precautions / Restrictions Precautions Precautions: Fall Precaution Comments: monitor sats and HR Required Braces or Orthoses: Other Brace/Splint Other Brace/Splint: NGT, coretrack, trach, PMV, ostomy    Mobility  Bed Mobility               General bed mobility comments: Pt is OOB in the recliner chair.   Transfers Overall transfer level: Needs assistance Equipment used: Rolling walker (2 wheeled) Transfers: Sit to/from Stand Sit to Stand: Min assist         General transfer comment: Min assist to help pt power up to stand, verbal cues and manual assist for safe hand placement.    Ambulation/Gait Ambulation/Gait assistance: Min assist Gait Distance (Feet): 700 Feet Assistive device: Rolling walker (2 wheeled) Gait Pattern/deviations: Step-through pattern;Shuffle;Trunk flexed Gait velocity: decreased Gait velocity interpretation: 1.31 - 2.62 ft/sec, indicative of limited community ambulator General Gait Details: Pt with slow, shuffling giat pattern, maneuvering RW around obstacles in the hallway.  O2sats down to 85% on 35% TC, but would come back up if we focused on breathing and walking and stopped talking.  PMV left off for gait to help with DOE, but pt able to talk around trach and finger occlude if we were stopped.            Balance Overall balance assessment: Needs assistance Sitting-balance support: Feet supported;No upper extremity supported Sitting balance-Leahy Scale: Good     Standing balance support: Bilateral upper extremity supported Standing balance-Leahy Scale: Fair Standing balance comment: Pt can stop and remove hands from RW in static standing.                             Cognition Arousal/Alertness: Awake/alert Behavior During Therapy: Flat affect Overall Cognitive Status: Impaired/Different from baseline Area of Impairment: Safety/judgement;Problem solving                         Safety/Judgement: Decreased awareness of safety Awareness: Emergent Problem Solving: Slow processing General Comments: Pt with mostly emergent, but some anticipatory  awareness (moving RW to avoid obstacles in the hallway, waiting for PT to be ready to stand).  He continues to be very flat, respond slowly to questions and move slowly.           General Comments General comments (skin integrity, edema, etc.): Once outside, pt stretching back leaning forward, coming up on toes, extending backwards.  He asked if he could kneel on the ground and I assisted him with min assist to lower down and come back up with the help of the RW.         Pertinent Vitals/Pain Pain Assessment: Faces Faces Pain Scale: Hurts even more Pain Location: back Pain Descriptors / Indicators: Aching Pain Intervention(s): Limited activity within patient's tolerance;Monitored during session;Repositioned;Patient requesting pain meds-RN notified           PT Goals (current goals can now be found in the care plan section) Progress towards PT goals: Progressing toward goals    Frequency    Min 3X/week      PT Plan Current plan remains appropriate       AM-PAC PT "6 Clicks" Daily Activity  Outcome Measure  Difficulty turning over in bed (including adjusting bedclothes, sheets and blankets)?: A Lot Difficulty moving from lying on back to sitting on the side of the bed? : A Lot Difficulty sitting down on and standing up from a chair with arms (e.g., wheelchair, bedside commode, etc,.)?: Unable Help needed moving to and from a bed to chair (including a wheelchair)?: A Little Help needed walking in hospital room?: A Little Help needed climbing 3-5 steps with a railing? : A Little 6 Click Score: 14    End of Session Equipment Utilized During Treatment: Oxygen;Gait belt Activity Tolerance: Patient limited by pain Patient left: in chair;with call bell/phone within reach Nurse Communication: Patient requests pain meds PT Visit Diagnosis: Unsteadiness on feet (R26.81);Other abnormalities of gait and mobility (R26.89);Muscle weakness (generalized) (M62.81);Difficulty in walking, not elsewhere classified (R26.2);Pain Pain - Right/Left: (lower) Pain - part of body: (back)     Time: 1256-1340 PT Time Calculation (min) (ACUTE ONLY): 44 min  Charges:  $Gait Training: 38-52 mins                    Marelyn Rouser B. Carnelia Oscar, PT, DPT  Acute Rehabilitation [phone] pager #[phone] office   10/15/2017, 1:51 PM

## 2017-10-15 NOTE — Progress Notes (Signed)
Patient ID: Philip Thompson, male   DOB: 1990-12-27, 27 y.o.   MRN: [id number]    18 Days Post-Op  Subjective: No new changes.  Put out about 4L from NGT yesterday.  Ileostomy still working well.  Objective: Vital signs in last 24 hours: Temp:  [97.2 F (36.2 C)-98.9 F (37.2 C)] 98.9 F (37.2 C) (09/28 0400) Pulse Rate:  [114-143] 123 (09/28 0732) Resp:  [11-33] 18 (09/28 0732) BP: (99-132)/(51-99) 118/82 (09/28 0600) SpO2:  [92 %-100 %] 92 % (09/28 0732) FiO2 (%):  [35 %] 35 % (09/28 0610) Weight:  [61.6 kg] 61.6 kg (09/28 0500) Last BM Date: 10/14/17  Intake/Output from previous day: 09/27 0701 - 09/28 0700 In: 3464 [I.V.:562.3; NG/GT:2046.3; IV Piggyback:855.4] Out: 6875 [Urine:1250; Emesis/NG output:4850; Stool:775] Intake/Output this shift: No intake/output data recorded.  PE: Abd: soft, NT, ND, ileostomy working well still.  NGT with bilious output.  Cortrak in place.  Lab Results:  Recent Labs    10/14/17 0415 10/15/17 0434  WBC 12.2* 8.9  HGB 12.0* 13.1  HCT 38.4* 41.7  PLT 317 342   BMET Recent Labs    10/14/17 1219 10/15/17 0434  NA 136 138  K 4.4 4.3  CL 100 97*  CO2 26 31  GLUCOSE 151* 107*  BUN 16 19  CREATININE 0.56* 0.63  CALCIUM 9.3 10.2   PT/INR No results for input(s): LABPROT, INR in the last 72 hours. CMP     Component Value Date/Time   NA 138 10/15/2017 0434   K 4.3 10/15/2017 0434   CL 97 (L) 10/15/2017 0434   CO2 31 10/15/2017 0434   GLUCOSE 107 (H) 10/15/2017 0434   BUN 19 10/15/2017 0434   CREATININE 0.63 10/15/2017 0434   CALCIUM 10.2 10/15/2017 0434   PROT 8.1 10/15/2017 0434   ALBUMIN 2.9 (L) 10/15/2017 0434   AST 24 10/15/2017 0434   ALT 37 10/15/2017 0434   ALKPHOS 187 (H) 10/15/2017 0434   BILITOT 0.5 10/15/2017 0434   GFRNONAA >60 10/15/2017 0434   GFRAA >60 10/15/2017 0434   Lipase     Component Value Date/Time   LIPASE 37 09/21/2017 2237       Studies/Results: Dg Abd 1 View  Result Date:  10/14/2017 CLINICAL DATA:  27 year old male with enteric tube placement EXAM: ABDOMEN - 1 VIEW COMPARISON:  Abdominal radiograph dated 10/13/2017 FINDINGS: A feeding tube is noted with tip in the third portion of the duodenum. A NG tube with side-port in the body of the stomach and tip in the distal stomach. No bowel dilatation. Partially visualized right lower quadrant ostomy. IVC filter. Right lung base atelectatic changes. Partially visualized right-sided central venous line with tip close to the cavoatrial junction. IMPRESSION: NG tube with tip in the distal stomach and a feeding tube with tip in the distal duodenum. Electronically Signed   By: Elgie Collard M.D.   On: 10/14/2017 03:38   Dg Abd 1 View  Result Date: 10/13/2017 CLINICAL DATA:  Abdominal distention EXAM: ABDOMEN - 1 VIEW COMPARISON:  10/08/2017 FINDINGS: Feeding tube tip is in the transverse duodenum. Right lower quadrant ostomy noted. No current bowel distention. No free air organomegaly. NG tube remains in the stomach. IVC filter in stable position. IMPRESSION: Decreasing bowel distension. No current bowel distention to suggest bowel obstruction. Feeding tube tip in the transverse duodenum. Electronically Signed   By: Charlett Nose M.D.   On: 10/13/2017 18:25    Anti-infectives: Anti-infectives (From admission, onward)   Start  Dose/Rate Route Frequency Ordered Stop   10/09/17 1730  ceFAZolin (ANCEF) IVPB 2g/100 mL premix     2 g 200 mL/hr over 30 Minutes Intravenous Every 8 hours 10/09/17 1641 10/16/17 2359   10/08/17 0815  erythromycin ethylsuccinate (EES) 200 MG/5ML suspension 200 mg  Status:  Discontinued     200 mg Per Tube Every 6 hours 10/08/17 0807 10/09/17 1636   09/27/17 1000  piperacillin-tazobactam (ZOSYN) IVPB 3.375 g  Status:  Discontinued     3.375 g 12.5 mL/hr over 240 Minutes Intravenous Every 8 hours 09/27/17 0845 10/06/17 1504       Assessment/Plan  S/P subtotal colectomy with ileostomy 9/10 Dr.  Magnus Ivan- Gastric atony, continue NG to suction and post pyloric TF.Reglan added.  -output increased to 4L yesterday -IR has been consulted for GJ tube placement in order to get his NGT and cortrak out.  Awaiting their evaluation. -still on reglan PE- Lovenox Acute resp failure- per CCM Tachycardia/SVT- per medicine PT/OT- rec CIR   FEN - NPO x ice/NGT/Cortrak with TFs VTE - Lovenox ID - Ancef   LOS: 23 days    Letha Cape , [facility] 10/15/2017, 7:53 AM Pager: [phone]

## 2017-10-15 NOTE — Progress Notes (Signed)
[place] NOTE   Pharmacy Consult for Lovenox; cefazolin Indication: pulmonary embolus; E.coli and MSSA in BAL  No Known Allergies  Patient Measurements: Height: 5' 7.99" (172.7 cm) Weight: 135 lb 12.9 oz (61.6 kg) IBW/kg (Calculated) : 68.38   Vital Signs: Temp: 97.4 F (36.3 C) (09/28 1125) Temp Source: Oral (09/28 1125) BP: 118/82 (09/28 0600) Pulse Rate: 125 (09/28 1205)  Labs: Recent Labs    10/13/17 0500 10/14/17 0415 10/14/17 1219 10/15/17 0434  HGB 12.7* 12.0*  --  13.1  HCT 41.0 38.4*  --  41.7  PLT 384 317  --  342  CREATININE 0.54*  --  0.56* 0.63    Estimated Creatinine Clearance: 120.8 mL/min (by C-G formula based on SCr of 0.63 mg/dL).   Assessment: [name] admitted 9/4 for megacolon and severe obstructive constipation with severely dilated colon and colitis s/p colectomy 9/10 and found to have bilateral pulmonary emboli and DVT (presumed to be related to GI issue impeding venous return). Initially treated with heparin infusion but stopped d/t continued bleeding from JP drain requiring PRBC and FFP 9/12 and another 2 units PRBC 9/14. Subsequently, IVC Filter placed 9/15. Pharmacy consulted 9/21 to switch to Lovenox.   AC: CBC improved. Patient's weight has trended down to 61.6 kg.   ID: Currently on D#7 of cefazolin for MSSA and E.coli in BAL. WBC has normalized today. Afebrile. CrCl remains stable.    Goal of Therapy:  Monitor platelets by anticoagulation protocol: Yes   Plan:  Decrease enoxaparin to 60 mg [place] q12h CBC every 72 hours Cefazolin 2 gm IV Q 8 hours  Monitor CBC, renal fx, and clinical progress  Monitor for signs/symptoms of bleeding   Albertina Parr, PharmD., BCPS Clinical Pharmacist Clinical phone for 10/15/17 until 8:30pm: [phone]

## 2017-10-15 NOTE — Plan of Care (Signed)
  Problem: Health Behavior/Discharge Planning: Goal: Ability to manage health-related needs will improve Outcome: Progressing   Problem: Clinical Measurements: Goal: Ability to maintain clinical measurements within normal limits will improve Outcome: Progressing Goal: Will remain free from infection Outcome: Progressing Goal: Diagnostic test results will improve Outcome: Progressing Goal: Respiratory complications will improve Outcome: Progressing Note:  Stable trach collar at 35% Goal: Cardiovascular complication will be avoided Outcome: Progressing Note:  HR improved   Problem: Activity: Goal: Risk for activity intolerance will decrease Outcome: Progressing Note:  Working with PT/OT, walked in hall   Problem: Nutrition: Goal: Adequate nutrition will be maintained Outcome: Progressing Note:  Tube feedings infusing   Problem: Coping: Goal: Level of anxiety will decrease Outcome: Progressing

## 2017-10-16 LAB — GLUCOSE, CAPILLARY
GLUCOSE-CAPILLARY: 112 mg/dL — AB (ref 70–99)
GLUCOSE-CAPILLARY: 116 mg/dL — AB (ref 70–99)
GLUCOSE-CAPILLARY: 131 mg/dL — AB (ref 70–99)
Glucose-Capillary: 104 mg/dL — ABNORMAL HIGH (ref 70–99)
Glucose-Capillary: 101 mg/dL — ABNORMAL HIGH (ref 70–99)
Glucose-Capillary: 119 mg/dL — ABNORMAL HIGH (ref 70–99)

## 2017-10-16 LAB — CBC
HEMATOCRIT: 43.8 % (ref 39.0–52.0)
Hemoglobin: 13.8 g/dL (ref 13.0–17.0)
MCH: 28 pg (ref 26.0–34.0)
MCHC: 31.5 g/dL (ref 30.0–36.0)
MCV: 89 fL (ref 78.0–100.0)
Platelets: 357 10*3/uL (ref 150–400)
RBC: 4.92 MIL/uL (ref 4.22–5.81)
RDW: 13.8 % (ref 11.5–15.5)
WBC: 7.3 10*3/uL (ref 4.0–10.5)

## 2017-10-16 LAB — CORTISOL: Cortisol, Plasma: 8.2 ug/dL

## 2017-10-16 MED ORDER — LEVALBUTEROL HCL 0.63 MG/3ML IN NEBU
0.6300 mg | INHALATION_SOLUTION | Freq: Two times a day (BID) | RESPIRATORY_TRACT | Status: DC
  Administered 2017-10-16 – 2017-10-18 (×4): 0.63 mg via RESPIRATORY_TRACT
  Filled 2017-10-16 (×4): qty 3

## 2017-10-16 MED ORDER — PROPRANOLOL HCL 20 MG/5ML PO SOLN
40.0000 mg | Freq: Three times a day (TID) | ORAL | Status: DC
  Administered 2017-10-16 – 2017-10-21 (×13): 40 mg
  Filled 2017-10-16 (×18): qty 10

## 2017-10-16 MED ORDER — VITAL AF 1.2 CAL PO LIQD
1500.0000 mL | ORAL | Status: DC
  Administered 2017-10-16 – 2017-10-21 (×4): 1500 mL
  Filled 2017-10-16 (×7): qty 1500

## 2017-10-16 NOTE — Progress Notes (Signed)
[place] TEAM 1 - Stepdown/ICU TEAM  Philip Thompson  DDU:[id number] DOB: [birth year] DOA: 09/21/2017 PCP: [name], MD    Brief Narrative:  27 year old male with a hx of polysubstance abuse, major depression, anxiety, chronic constipation, and hypothyroidism who presented to the ED w/ severe abdominal pain and distention with nausea and vomiting.  In the ED he was found to have colonic inertia/megacolon secondary to constipation, along with a pulmonary embolism.   Significant Events: 9/04 admit 9/10 total colectomy and ileostomy > massive distended colon from cecum all the way to the distal rectum and pelvic ring  9/10 intubation, unable to extubate 9/11 PICC line 9/15 placed retrievable IVC filter due to JP drain bleeding while on anti-coagulation 9/18 Shiley trach cuffed 9/22 started on Precedex for agitation, successfully weaned off on 9/23 9/23 JP drain out   Subjective: Doing well. Awake and conversant. Wants to sit up all day and frequently asks to be walked. Denies cp or sob.   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 - gastric atony  Ongoing care per Gen Surgery - NG to suction w/ ongoing high volume output - post-pyloric feeding - awaiting placement of G-J tube per IR   Acute hypoxic respiratory failure > tracheostomy secondary to PE, mucous plugging, and abdom compartment syndrome (initially) - resp care per PCCM - possible trach decannulation early this week - doing well off vent   Provoked PE/DVT  Due to bowel obstruction/abdominal compartment syndrome - initial anticoagulation was held due to bleed on 9/14 - s/p retrievable filter 9/15 - currently on full dose Lovenox - IR recommending to retrieve filter in 4 weeks (~10/15)  Persistent Sinus Tachycardia Timing is too late for withdrawal - likely due to deconditioning + PE + anxiety - was not previously on BB - TSH 3.1 - Mg is normal - serum cortisol normal - improved w/ BB -  increase dose and follow   Sepsis due to Staph aureus and E. Coli in BAL To complete 1 more day of abx tx   Anxiety/depression - Hx substance abuse on multiple psych meds - Psychiatry consulted and meds adjusted - affect is normal/cheerful   Hypothyroidism continue Synthroid  Severe malnutrition in context of chronic illness on postpyloric tube feeding per Gen Surgery   DVT prophylaxis: lovenox  Code Status: FULL CODE Family Communication: spoke w/ family at bedside  Disposition Plan: awaiting CIR bed - medically ready for CIR   Consultants:   General surgery  IR  PCCM  GI   Psychiatry  Antimicrobials:  Cefazolin 9/22 >   Objective: Blood pressure 105/66, pulse (!) 124, temperature 98.2 F (36.8 C), temperature source Oral, resp. rate 16, height 5' 7.99" (1.727 m), weight 61.6 kg, SpO2 100 %.  Intake/Output Summary (Last 24 hours) at 10/16/2017 0938 Last data filed at 10/16/2017 0915 Gross per 24 hour  Intake 3110 ml  Output 7225 ml  Net -4115 ml   Filed Weights   10/14/17 0500 10/15/17 0500 10/16/17 0500  Weight: 64.5 kg 61.6 kg 61.6 kg    Examination: General: No acute respiratory distress  Lungs: Clear to auscultation bilaterally Cardiovascular: tachycardic but less so - regular - no M or rub  Abdomen: Nontender, nondistended, soft, BS+ Extremities: No signif edema B LE   CBC: Recent Labs  Lab 10/11/17 0557  10/14/17 0415 10/15/17 0434 10/16/17 0357  WBC 13.0*   < > 12.2* 8.9 7.3  NEUTROABS 9.6*  --   --   --   --  HGB 11.1*   < > 12.0* 13.1 13.8  HCT 35.8*   < > 38.4* 41.7 43.8  MCV 90.9   < > 90.8 90.3 89.0  PLT 318   < > 317 342 357   < > = values in this interval not displayed.   Basic Metabolic Panel: Recent Labs  Lab 10/12/17 0500 10/13/17 0500 10/14/17 1219 10/15/17 0434  NA 136 134* 136 138  K 4.5 5.1 4.4 4.3  CL 99 99 100 97*  CO2 _0 GLUCOSE 109* 240* 151* 107*  BUN _1 CREATININE 0.46* 0.54* 0.56*  0.63  CALCIUM 8.8* 9.2 9.3 10.2  MG 1.9 2.1  --  2.0  PHOS 3.5 4.4  --  4.8*   GFR: Estimated Creatinine Clearance: 120.8 mL/min (by C-G formula based on SCr of 0.63 mg/dL).  Liver Function Tests: Recent Labs  Lab 10/10/17 0343 10/11/17 0557 10/13/17 0500 10/15/17 0434  AST _2 ALT 63* 50* 46* 37  ALKPHOS 98 116 158* 187*  BILITOT 0.4 0.4 0.3 0.5  PROT 6.4* 6.3* 6.9 8.1  ALBUMIN 2.2* 2.3* 2.5* 2.9*    HbA1C: Hgb A1c MFr Bld  Date/Time Value Ref Range Status  03/27/2017 06:54 AM 5.4 4.8 - 5.6 % Final    Comment:    (NOTE) Pre diabetes:          5.7%-6.4% Diabetes:              >6.4% Glycemic control for   <7.0% adults with diabetes   07/02/2016 07:34 AM 5.4 4.8 - 5.6 % Final    Comment:    (NOTE)         Pre-diabetes: 5.7 - 6.4         Diabetes: >6.4         Glycemic control for adults with diabetes: <7.0     CBG: Recent Labs  Lab 10/15/17 1602 10/15/17 1957 10/16/17 0007 10/16/17 0400 10/16/17 0732  GLUCAP 110* 121* 131* 104* 101*    Scheduled Meds: . busPIRone  10 mg Per Tube BID  . chlorhexidine  15 mL Mouth Rinse BID  . Chlorhexidine Gluconate Cloth  6 each Topical Daily  . citalopram  20 mg Per Tube Daily  . clonazePAM  1 mg Per Tube Q8H  . enoxaparin (LOVENOX) injection  60 mg Subcutaneous Q12H  . famotidine  20 mg Per Tube BID  . free water  250 mL Per Tube Q6H  . gabapentin  300 mg Per Tube Q12H  . levalbuterol  0.63 mg Nebulization Q8H  . levothyroxine  75 mcg Per Tube QAC breakfast  . mouth rinse  15 mL Mouth Rinse q12n4p  . metoCLOPramide (REGLAN) injection  10 mg Intravenous Q6H  . nicotine  21 mg Transdermal Daily  . oxyCODONE  5 mg Per Tube Q6H  . propranolol  40 mg Per Tube TID  . QUEtiapine  200 mg Per Tube QHS  . sodium chloride flush  10-40 mL Intracatheter Q12H  . sodium chloride flush  3 mL Intravenous Q12H  . traZODone  100 mg Oral QHS  . valproic acid  500 mg Per Tube BID   Continuous Infusions: .  ceFAZolin  (ANCEF) IV 2 g (10/16/17 0820)  . feeding supplement (VITAL AF 1.2 CAL)       LOS: 24 days   Cherene Altes, MD [facility] Office  [phone] Pager - Text Page per Shea Evans  If  7PM-7AM, please contact night-coverage per Amion 10/16/2017, 9:38 AM

## 2017-10-17 ENCOUNTER — Inpatient Hospital Stay (HOSPITAL_COMMUNITY): Payer: BLUE CROSS/BLUE SHIELD

## 2017-10-17 ENCOUNTER — Encounter (HOSPITAL_COMMUNITY): Payer: Self-pay | Admitting: Interventional Radiology

## 2017-10-17 HISTORY — PX: IR GASTROSTOMY TUBE MOD SED: IMG625

## 2017-10-17 LAB — COMPREHENSIVE METABOLIC PANEL
ALK PHOS: 180 U/L — AB (ref 38–126)
ALT: 29 U/L (ref 0–44)
ANION GAP: 10 (ref 5–15)
AST: 21 U/L (ref 15–41)
Albumin: 3.1 g/dL — ABNORMAL LOW (ref 3.5–5.0)
BILIRUBIN TOTAL: 0.6 mg/dL (ref 0.3–1.2)
BUN: 28 mg/dL — ABNORMAL HIGH (ref 6–20)
CALCIUM: 9.7 mg/dL (ref 8.9–10.3)
CO2: 35 mmol/L — AB (ref 22–32)
Chloride: 89 mmol/L — ABNORMAL LOW (ref 98–111)
Creatinine, Ser: 0.66 mg/dL (ref 0.61–1.24)
Glucose, Bld: 98 mg/dL (ref 70–99)
Potassium: 3.5 mmol/L (ref 3.5–5.1)
SODIUM: 134 mmol/L — AB (ref 135–145)
TOTAL PROTEIN: 7.7 g/dL (ref 6.5–8.1)

## 2017-10-17 LAB — PROTIME-INR
INR: 1.12
Prothrombin Time: 14.3 seconds (ref 11.4–15.2)

## 2017-10-17 LAB — SUSCEPTIBILITY, AER + ANAEROB

## 2017-10-17 LAB — CBC
HEMATOCRIT: 46.5 % (ref 39.0–52.0)
Hemoglobin: 15 g/dL (ref 13.0–17.0)
MCH: 28.5 pg (ref 26.0–34.0)
MCHC: 32.3 g/dL (ref 30.0–36.0)
MCV: 88.2 fL (ref 78.0–100.0)
PLATELETS: 379 10*3/uL (ref 150–400)
RBC: 5.27 MIL/uL (ref 4.22–5.81)
RDW: 13.6 % (ref 11.5–15.5)
WBC: 8.3 10*3/uL (ref 4.0–10.5)

## 2017-10-17 LAB — SUSCEPTIBILITY RESULT

## 2017-10-17 LAB — PHOSPHORUS: PHOSPHORUS: 4.1 mg/dL (ref 2.5–4.6)

## 2017-10-17 LAB — GLUCOSE, CAPILLARY
GLUCOSE-CAPILLARY: 126 mg/dL — AB (ref 70–99)
GLUCOSE-CAPILLARY: 131 mg/dL — AB (ref 70–99)
GLUCOSE-CAPILLARY: 129 mg/dL — AB (ref 70–99)
Glucose-Capillary: 111 mg/dL — ABNORMAL HIGH (ref 70–99)
Glucose-Capillary: 128 mg/dL — ABNORMAL HIGH (ref 70–99)

## 2017-10-17 LAB — MAGNESIUM: Magnesium: 1.9 mg/dL (ref 1.7–2.4)

## 2017-10-17 MED ORDER — IOPAMIDOL (ISOVUE-300) INJECTION 61%
INTRAVENOUS | Status: AC
  Administered 2017-10-17: 20 mL
  Filled 2017-10-17: qty 50

## 2017-10-17 MED ORDER — CEFAZOLIN SODIUM-DEXTROSE 2-4 GM/100ML-% IV SOLN: 2.0000 g | Freq: Three times a day (TID) | INTRAVENOUS | Status: DC

## 2017-10-17 MED ORDER — PROPRANOLOL HCL 1 MG/ML IV SOLN
2.0000 mg | Freq: Once | INTRAVENOUS | Status: AC
  Administered 2017-10-17: 2 mg via INTRAVENOUS
  Filled 2017-10-17: qty 2

## 2017-10-17 MED ORDER — KCL IN DEXTROSE-NACL 20-5-0.9 MEQ/L-%-% IV SOLN: INTRAVENOUS | Status: DC

## 2017-10-17 MED ORDER — ACETAMINOPHEN 650 MG RE SUPP: 650.0000 mg | RECTAL | Status: DC | PRN

## 2017-10-17 MED ORDER — MIDAZOLAM HCL 2 MG/2ML IJ SOLN
INTRAMUSCULAR | Status: AC
  Filled 2017-10-17: qty 4

## 2017-10-17 MED ORDER — FLUTICASONE PROPIONATE 50 MCG/ACT NA SUSP
2.0000 | Freq: Every day | NASAL | Status: DC
  Administered 2017-10-17 – 2017-10-21 (×5): 2 via NASAL
  Filled 2017-10-17: qty 16

## 2017-10-17 MED ORDER — CEFAZOLIN SODIUM-DEXTROSE 2-4 GM/100ML-% IV SOLN
2.0000 g | Freq: Once | INTRAVENOUS | Status: AC
  Administered 2017-10-17: 2 g via INTRAVENOUS
  Filled 2017-10-17: qty 100

## 2017-10-17 MED ORDER — LORAZEPAM 2 MG/ML IJ SOLN: 0.5000 mg | Freq: Four times a day (QID) | INTRAMUSCULAR | Status: DC | PRN

## 2017-10-17 MED ORDER — PROPRANOLOL HCL 1 MG/ML IV SOLN
2.0000 mg | INTRAVENOUS | Status: DC
  Administered 2017-10-17 – 2017-10-18 (×3): 2 mg via INTRAVENOUS
  Filled 2017-10-17 (×4): qty 2

## 2017-10-17 MED ORDER — LIDOCAINE HCL 1 % IJ SOLN
INTRAMUSCULAR | Status: AC
  Filled 2017-10-17: qty 20

## 2017-10-17 MED ORDER — ENOXAPARIN SODIUM 60 MG/0.6ML ~~LOC~~ SOLN
60.0000 mg | Freq: Two times a day (BID) | SUBCUTANEOUS | Status: AC
  Administered 2017-10-18 (×2): 60 mg via SUBCUTANEOUS
  Filled 2017-10-17 (×2): qty 0.6

## 2017-10-17 MED ORDER — LIDOCAINE HCL 1 % IJ SOLN
INTRAMUSCULAR | Status: AC | PRN
  Administered 2017-10-17: 10 mL

## 2017-10-17 MED ORDER — MIDAZOLAM HCL 2 MG/2ML IJ SOLN
INTRAMUSCULAR | Status: AC | PRN
  Administered 2017-10-17 (×2): 1 mg via INTRAVENOUS

## 2017-10-17 MED ORDER — FENTANYL CITRATE (PF) 100 MCG/2ML IJ SOLN
INTRAMUSCULAR | Status: AC | PRN
  Administered 2017-10-17 (×2): 50 ug via INTRAVENOUS
  Administered 2017-10-17: 25 ug via INTRAVENOUS

## 2017-10-17 MED ORDER — LORAZEPAM 2 MG/ML IJ SOLN
0.5000 mg | Freq: Four times a day (QID) | INTRAMUSCULAR | Status: DC | PRN
  Administered 2017-10-17 – 2017-10-18 (×2): 1 mg via INTRAVENOUS
  Filled 2017-10-17 (×2): qty 1

## 2017-10-17 MED ORDER — CEFAZOLIN SODIUM-DEXTROSE 2-4 GM/100ML-% IV SOLN
INTRAVENOUS | Status: AC
  Filled 2017-10-17: qty 100

## 2017-10-17 MED ORDER — HYDROMORPHONE HCL 1 MG/ML IJ SOLN
INTRAMUSCULAR | Status: AC
  Filled 2017-10-17: qty 1

## 2017-10-17 MED ORDER — PROPRANOLOL HCL 1 MG/ML IV SOLN: 2.0000 mg | INTRAVENOUS | Status: DC

## 2017-10-17 MED ORDER — BACITRACIN-NEOMYCIN-POLYMYXIN 400-5-5000 EX OINT
1.0000 "application " | TOPICAL_OINTMENT | Freq: Every day | CUTANEOUS | Status: DC
  Administered 2017-10-17 – 2017-10-21 (×5): 1 via TOPICAL
  Filled 2017-10-17 (×3): qty 1

## 2017-10-17 MED ORDER — FENTANYL CITRATE (PF) 100 MCG/2ML IJ SOLN
INTRAMUSCULAR | Status: AC
  Filled 2017-10-17: qty 4

## 2017-10-17 MED ORDER — KCL IN DEXTROSE-NACL 20-5-0.9 MEQ/L-%-% IV SOLN
INTRAVENOUS | Status: DC
  Administered 2017-10-17: 20:00:00 via INTRAVENOUS
  Filled 2017-10-17: qty 1000

## 2017-10-17 NOTE — Progress Notes (Signed)
Philip Thompson  OIN:[id number] DOB: Nov 11, 1990 DOA: 09/21/2017 PCP: [name], MD    LOS: 25 days    Reason for Consult / Chief Complaint:  Pre op acute hypoxemic respiratory failure   Consulting MD and date:  Philip Thompson  HPI/Summary of hospital stay:  27 y/o male admitted with colonic inertia/megacolon secondary to constipation and bilateral pulmonary emboli and DVT due to pelvic vein obstruction.  He had acute aspiration pneumonia on 9/10 prior to surgery in the setting of nausea/vomiting.  He went to the OR on 9/10 for total colectomy and ileostomy.    Ventilator weaning has been hampered by generalized weakness, periods of agitation and the patient / family's requests for sedation.  Initially started on TPN for nutrition now transitioning to enteral nutrition.  Found to have DVT. Filter placed initially given bleeding from JP drain. IV heparin eventually started and now transitioned to Lovenox. Filter remains in place.   9/22 - started on precedex for agitation, spitting at staff/kicking, attempting to bite 9/23 - more oriented, klonopin increased / did not tolerate  9/27 tolerated ATC for 24 hours.  9/30 Off vent since 9/24  Subjective:  No complaints. Off vent since 9/24.  Objective   Blood pressure 116/84, pulse (!) 135, temperature (!) 96.9 F (36.1 C), temperature source Axillary, resp. rate 13, height 5' 7.99" (1.727 m), weight 61.7 kg, SpO2 93 %.    FiO2 (%):  [21 %] 21 %   Intake/Output Summary (Last 24 hours) at 10/17/2017 1258 Last data filed at 10/17/2017 1221 Gross per 24 hour  Intake 950 ml  Output 3745 ml  Net -2795 ml   Filed Weights   10/15/17 0500 10/16/17 0500 10/17/17 0348  Weight: 61.6 kg 61.6 kg 61.7 kg    Examination:  General: frail young male in NAD. HEENT: Trach in place, on room air.  Neuro: Intact follows commands able to phonate without any trach occlusion at all.  CV: RRR, tachy 120s.  PULM: even/non-labored, Room air.  GI:  Longitudal incision CDI, ostomy pink, healthy.  Extremities: warm/dry, neg  edema  Skin: no rashes or lesions   Consults: date of consult/date signed off & final recs:  General surgery 9/6 GI 9/6 Critical care 9/10   Procedures: 9/10 - Total colectomy and ileostomy.  Was found to have a massively distended colon from the cecum all the way to distal rectum and pelvic rim.  No evidence of perforation. Oral endotracheal tube 9/10 >> 9/12.  9/13 >  PICC 9/11>>  ETT 9/10 >> 9/12;  9/13 >> 9/19 Retrievable IVC filter 9/15 6 shiley trach cuffed 9/18 >>  Assessment & Plan:   Acute respiratory failure with hypoxemia due to nutrition related weakness and mucus plugging compounded by sedation use. P: Tolerating trach collar almost one week Ready for decannulation. May not even need to cap. Will discuss with MD.   Provoked DVT/PE from bowel obstruction/abdominal compartment syndrome - anticoagulation held d/t JP bled on 9/14  - no evidence of RV strain on CT - S/p retrievable filter 9/15 P: He has a IVC filter in place Retrieval within 4 weeks in place  Ogilvie's syndrome s/p colectomy and ileostomy -JP drain out, staples out 9/23 P:  General surgery is following  HCAP + BAL culture for Staph Aureus and E coli. 9/22. RLL collapse/atelectasis P: Per primary   Disposition / Summary of Today's Plan 10/17/17   Doing great with trach. Has been off vent for almost a  week at this point. Can work towards decannulation. May not even need to cap. Will discuss with MD.     Feeding: TPN / TF   Analgesia: enteral oxy Sedation: RASS goal 0   Thromboprophylaxis: IVC filter, Lovenox. HOB >30 degrees Ulcer prophylaxis: famotidine Glucose control: CBG q 4, SSI Family: 9/30 mother updated bedside  Labs and ancillary test (personally reviewed)   Recent Labs  Lab 10/11/17 0557 10/12/17 0500 10/13/17 0500 10/14/17 1219 10/15/17 0434 10/17/17 0351  NA 136 136 134* 136 138 134*  K  4.3 4.5 5.1 4.4 4.3 3.5  CL 102 99 99 100 97* 89*  CO2 _0 35*  GLUCOSE 106* 109* 240* 151* 107* 98  BUN _1 28*  CREATININE 0.46* 0.46* 0.54* 0.56* 0.63 0.66  CALCIUM 8.9 8.8* 9.2 9.3 10.2 9.7  MG 1.8 1.9 2.1  --  2.0 1.9  PHOS 3.3 3.5 4.4  --  4.8* 4.1    Recent Labs  Lab 10/15/17 0434 10/16/17 0357 10/17/17 0351  HGB 13.1 13.8 15.0  HCT 41.7 43.8 46.5  WBC 8.9 7.3 8.3  PLT 342 357 379    Georgann Housekeeper, AGACNP-BC [place] Pager [phone] or [phone]  10/17/2017 1:00 PM

## 2017-10-17 NOTE — Progress Notes (Signed)
Patient ID: TC ALFARO, male   DOB: [birth year], 27 y.o.   MRN: [id number]    20 Days Post-Op  Subjective: No new complaints.  Still with high NGT output.  Objective: Vital signs in last 24 hours: Temp:  [96.6 F (35.9 C)-98.1 F (36.7 C)] 97.8 F (36.6 C) (09/30 0755) Pulse Rate:  [95-124] 122 (09/30 0600) Resp:  [13-30] 30 (09/30 0600) BP: (90-117)/(66-90) 102/87 (09/30 0600) SpO2:  [93 %-100 %] 95 % (09/30 0600) FiO2 (%):  [21 %] 21 % (09/30 0507) Weight:  [61.7 kg] 61.7 kg (09/30 0348) Last BM Date: 10/16/17  Intake/Output from previous day: 09/29 0701 - 09/30 0700 In: 1640 [I.V.:10; NG/GT:1530; IV Piggyback:100] Out: 3645 [Urine:405; Emesis/NG output:2050; Stool:1190] Intake/Output this shift: No intake/output data recorded.  PE: Abd: soft, NT, ND, +BS, NGT and cortrak both in place.  Ileostomy in place and just emptied.  Midline incision is healing well.  Lab Results:  Recent Labs    10/16/17 0357 10/17/17 0351  WBC 7.3 8.3  HGB 13.8 15.0  HCT 43.8 46.5  PLT 357 379   BMET Recent Labs    10/15/17 0434 10/17/17 0351  NA 138 134*  K 4.3 3.5  CL 97* 89*  CO2 31 35*  GLUCOSE 107* 98  BUN 19 28*  CREATININE 0.63 0.66  CALCIUM 10.2 9.7   PT/INR Recent Labs    10/17/17 0351  LABPROT 14.3  INR 1.12   CMP     Component Value Date/Time   NA 134 (L) 10/17/2017 0351   K 3.5 10/17/2017 0351   CL 89 (L) 10/17/2017 0351   CO2 35 (H) 10/17/2017 0351   GLUCOSE 98 10/17/2017 0351   BUN 28 (H) 10/17/2017 0351   CREATININE 0.66 10/17/2017 0351   CALCIUM 9.7 10/17/2017 0351   PROT 7.7 10/17/2017 0351   ALBUMIN 3.1 (L) 10/17/2017 0351   AST 21 10/17/2017 0351   ALT 29 10/17/2017 0351   ALKPHOS 180 (H) 10/17/2017 0351   BILITOT 0.6 10/17/2017 0351   GFRNONAA >60 10/17/2017 0351   GFRAA >60 10/17/2017 0351   Lipase     Component Value Date/Time   LIPASE 37 09/21/2017 2237       Studies/Results: No results  found.  Anti-infectives: Anti-infectives (From admission, onward)   Start     Dose/Rate Route Frequency Ordered Stop   10/09/17 1730  ceFAZolin (ANCEF) IVPB 2g/100 mL premix     2 g 200 mL/hr over 30 Minutes Intravenous Every 8 hours 10/09/17 1641 10/16/17 1745   10/08/17 0815  erythromycin ethylsuccinate (EES) 200 MG/5ML suspension 200 mg  Status:  Discontinued     200 mg Per Tube Every 6 hours 10/08/17 0807 10/09/17 1636   09/27/17 1000  piperacillin-tazobactam (ZOSYN) IVPB 3.375 g  Status:  Discontinued     3.375 g 12.5 mL/hr over 240 Minutes Intravenous Every 8 hours 09/27/17 0845 10/06/17 1504       Assessment/Plan S/P subtotal colectomy with ileostomy 9/10 Dr. Magnus Ivan- Gastric atony, continue NG to suction and post pyloric TF. -output still high -IR plans for G-tube today and GJ conversion later. -still on reglan PE- Lovenox, on hold today for g-tube placement Acute resp failure- per CCM Tachycardia/SVT- per medicine PT/OT- rec CIR   FEN - NPO x ice/NGT/Cortrak with TFs, but on hold for procedure today VTE - Lovenox ID - Ancef   LOS: 25 days    Letha Cape , [facility] Surgery 10/17/2017, 8:11 AM Pager:  [phone]  

## 2017-10-17 NOTE — Progress Notes (Signed)
SLP Cancellation Note  Patient Details Name: WAITE MISCHLER MRN: [id number] DOB: Apr 02, 1990   Cancelled treatment:       Reason Eval/Treat Not Completed: Patient at procedure or test/unavailable   Maxcine Ham 10/17/2017, 3:56 PM  Maxcine Ham, M.A. CCC-SLP Acute Herbalist [phone] Office [phone]

## 2017-10-17 NOTE — Progress Notes (Signed)
[place] TEAM 1 - Stepdown/ICU TEAM  Philip Thompson  JAS:[id number] DOB: [birth year] DOA: 09/21/2017 PCP: [name], MD    Brief Narrative:  27 year old male with a hx of polysubstance abuse, major depression, anxiety, chronic constipation, and hypothyroidism who presented to the ED w/ severe abdominal pain and distention with nausea and vomiting.  In the ED he was found to have colonic inertia/megacolon secondary to constipation, along with a pulmonary embolism.   Significant Events: 9/04 admit 9/10 total colectomy and ileostomy > massive distended colon from cecum all the way to the distal rectum and pelvic ring  9/10 intubation, unable to extubate 9/11 PICC line 9/15 placed retrievable IVC filter due to JP drain bleeding while on anti-coagulation 9/18 Shiley trach cuffed 9/22 started on Precedex for agitation, successfully weaned off on 9/23 9/23 JP drain out   Subjective: C/o ongoing back pain. Has been in and out of chair today. Denies cp, n/v, or abdom pain.   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 - gastric atony  Ongoing care per Gen Surgery - NG to suction w/ ongoing high volume output - post-pyloric feeding - awaiting placement of G-J tube per IR   Acute hypoxic respiratory failure > tracheostomy secondary to PE, mucous plugging, and abdom compartment syndrome (initially) - resp care per PCCM - possible trach decannulation early this week - doing well off vent   Provoked PE/DVT  Due to bowel obstruction/abdominal compartment syndrome - initial anticoagulation was held due to bleed on 9/14 - s/p retrievable filter 9/15 - currently on full dose Lovenox but on hold today for IR procedure - IR recommending to retrieve filter in 4 weeks (~10/15)  Persistent Sinus Tachycardia Timing is too late for withdrawal - likely due to deconditioning + PE + anxiety - was not previously on BB - TSH 3.1 - Mg is normal - serum cortisol normal - was  improving on BB but dose held this morning as pt was "NPO" - give extra IV dose x1 and resume scheduled per tube dosing   Sepsis due to Staph aureus and E. Coli in BAL Has completed a full course of abx tx   Anxiety/depression - Hx substance abuse on multiple psych meds - Psychiatry consulted and meds adjusted - affect is stable   Hypothyroidism continue Synthroid  Severe malnutrition in context of chronic illness on postpyloric tube feeding per Gen Surgery   DVT prophylaxis: lovenox  Code Status: FULL CODE Family Communication: spoke w/ family at bedside  Disposition Plan: awaiting CIR bed - medically ready for CIR   Consultants:   General surgery  IR  PCCM  GI   Psychiatry  Antimicrobials:  Cefazolin 9/22 >   Objective: Blood pressure 116/84, pulse (!) 135, temperature (!) 96.9 F (36.1 C), temperature source Axillary, resp. rate 13, height 5' 7.99" (1.727 m), weight 61.7 kg, SpO2 96 %.  Intake/Output Summary (Last 24 hours) at 10/17/2017 1358 Last data filed at 10/17/2017 1221 Gross per 24 hour  Intake 870 ml  Output 3005 ml  Net -2135 ml   Filed Weights   10/15/17 0500 10/16/17 0500 10/17/17 0348  Weight: 61.6 kg 61.6 kg 61.7 kg    Examination: General: No acute respiratory distress - alert  Lungs: CTA B - no wheezing  Cardiovascular: tachycardic - reg rhythm - no M  Abdomen: Nontender, nondistended, soft, BS+ - fully healed mildline incision  Extremities: No edema B LE   CBC: Recent Labs  Lab  10/11/17 0557  10/15/17 0434 10/16/17 0357 10/17/17 0351  WBC 13.0*   < > 8.9 7.3 8.3  NEUTROABS 9.6*  --   --   --   --   HGB 11.1*   < > 13.1 13.8 15.0  HCT 35.8*   < > 41.7 43.8 46.5  MCV 90.9   < > 90.3 89.0 88.2  PLT 318   < > 342 357 379   < > = values in this interval not displayed.   Basic Metabolic Panel: Recent Labs  Lab 10/13/17 0500 10/14/17 1219 10/15/17 0434 10/17/17 0351  NA 134* 136 138 134*  K 5.1 4.4 4.3 3.5  CL 99 100 97*  89*  CO2 _0 35*  GLUCOSE 240* 151* 107* 98  BUN _1 28*  CREATININE 0.54* 0.56* 0.63 0.66  CALCIUM 9.2 9.3 10.2 9.7  MG 2.1  --  2.0 1.9  PHOS 4.4  --  4.8* 4.1   GFR: Estimated Creatinine Clearance: 121 mL/min (by C-G formula based on SCr of 0.66 mg/dL).  Liver Function Tests: Recent Labs  Lab 10/11/17 0557 10/13/17 0500 10/15/17 0434 10/17/17 0351  AST _2 ALT 50* 46* 37 29  ALKPHOS 116 158* 187* 180*  BILITOT 0.4 0.3 0.5 0.6  PROT 6.3* 6.9 8.1 7.7  ALBUMIN 2.3* 2.5* 2.9* 3.1*    HbA1C: Hgb A1c MFr Bld  Date/Time Value Ref Range Status  03/27/2017 06:54 AM 5.4 4.8 - 5.6 % Final    Comment:    (NOTE) Pre diabetes:          5.7%-6.4% Diabetes:              >6.4% Glycemic control for   <7.0% adults with diabetes   07/02/2016 07:34 AM 5.4 4.8 - 5.6 % Final    Comment:    (NOTE)         Pre-diabetes: 5.7 - 6.4         Diabetes: >6.4         Glycemic control for adults with diabetes: <7.0     CBG: Recent Labs  Lab 10/16/17 1503 10/16/17 2335 10/17/17 0404 10/17/17 0729 10/17/17 1110  GLUCAP 116* 112* 126* 111* 131*    Scheduled Meds: . busPIRone  10 mg Per Tube BID  . chlorhexidine  15 mL Mouth Rinse BID  . Chlorhexidine Gluconate Cloth  6 each Topical Daily  . citalopram  20 mg Per Tube Daily  . clonazePAM  1 mg Per Tube Q8H  . famotidine  20 mg Per Tube BID  . free water  250 mL Per Tube Q6H  . gabapentin  300 mg Per Tube Q12H  . levalbuterol  0.63 mg Nebulization BID  . levothyroxine  75 mcg Per Tube QAC breakfast  . mouth rinse  15 mL Mouth Rinse q12n4p  . metoCLOPramide (REGLAN) injection  10 mg Intravenous Q6H  . nicotine  21 mg Transdermal Daily  . oxyCODONE  5 mg Per Tube Q6H  . propranolol  40 mg Per Tube TID  . QUEtiapine  200 mg Per Tube QHS  . sodium chloride flush  10-40 mL Intracatheter Q12H  . sodium chloride flush  3 mL Intravenous Q12H  . traZODone  100 mg Oral QHS  . valproic acid  500 mg Per Tube BID    Continuous Infusions: . feeding supplement (VITAL AF 1.2 CAL) Stopped (10/17/17 0023)     LOS: 25 days   Dellis Filbert  Hennie Duos, MD [facility] Office  [phone] Pager - Text Page per Amion  If 7PM-7AM, please contact night-coverage per Amion 10/17/2017, 1:58 PM

## 2017-10-17 NOTE — Progress Notes (Signed)
Pt returned from IR per bed post G tube placement LUQ dressing clean & dry Pt drowsy but able to answer orientation questions correctly.

## 2017-10-17 NOTE — Procedures (Signed)
Interventional Radiology Procedure Note  Procedure: Placement of percutaneous 20F pull-through gastrostomy tube. Complications: None Recommendations: - NPO except for sips and chips remainder of today and overnight - Maintain G-tube to LWS until tomorrow morning  - May advance diet as tolerated and begin using tube tomorrow morning  Signed,   Raguel Kosloski S. Shevonne Wolf, DO   

## 2017-10-17 NOTE — Progress Notes (Signed)
Pt to IR per bed with extra trach, obturator, and ambu bag.

## 2017-10-17 NOTE — Progress Notes (Signed)
PT Cancellation Note  Patient Details Name: Philip Thompson MRN: [id number] DOB: 1990/07/19   Cancelled Treatment:    Reason Eval/Treat Not Completed: (P) Patient at procedure or test/unavailable Pt is in OR. PT will follow back tomorrow for therapy.   Westlee Devita B. Beverely Risen PT, DPT Acute Rehabilitation Services Pager [phone] Office [phone]    [name] 10/17/2017, 3:21 PM

## 2017-10-18 LAB — GLUCOSE, CAPILLARY
GLUCOSE-CAPILLARY: 124 mg/dL — AB (ref 70–99)
GLUCOSE-CAPILLARY: 120 mg/dL — AB (ref 70–99)
Glucose-Capillary: 116 mg/dL — ABNORMAL HIGH (ref 70–99)
Glucose-Capillary: 136 mg/dL — ABNORMAL HIGH (ref 70–99)
Glucose-Capillary: 110 mg/dL — ABNORMAL HIGH (ref 70–99)
Glucose-Capillary: 131 mg/dL — ABNORMAL HIGH (ref 70–99)

## 2017-10-18 LAB — BASIC METABOLIC PANEL
Anion gap: 11 (ref 5–15)
BUN: 43 mg/dL — AB (ref 6–20)
CALCIUM: 9.8 mg/dL (ref 8.9–10.3)
CO2: 32 mmol/L (ref 22–32)
CREATININE: 0.97 mg/dL (ref 0.61–1.24)
Chloride: 92 mmol/L — ABNORMAL LOW (ref 98–111)
Glucose, Bld: 313 mg/dL — ABNORMAL HIGH (ref 70–99)
Potassium: 4.7 mmol/L (ref 3.5–5.1)
SODIUM: 135 mmol/L (ref 135–145)

## 2017-10-18 LAB — CBC
HCT: 48 % (ref 39.0–52.0)
Hemoglobin: 15.4 g/dL (ref 13.0–17.0)
MCH: 28.3 pg (ref 26.0–34.0)
MCHC: 32.1 g/dL (ref 30.0–36.0)
MCV: 88.1 fL (ref 78.0–100.0)
Platelets: 465 10*3/uL — ABNORMAL HIGH (ref 150–400)
RBC: 5.45 MIL/uL (ref 4.22–5.81)
RDW: 13.6 % (ref 11.5–15.5)
WBC: 14.8 10*3/uL — ABNORMAL HIGH (ref 4.0–10.5)

## 2017-10-18 LAB — MAGNESIUM: MAGNESIUM: 2 mg/dL (ref 1.7–2.4)

## 2017-10-18 MED ORDER — OXYCODONE HCL 5 MG/5ML PO SOLN
5.0000 mg | Freq: Four times a day (QID) | ORAL | Status: DC
  Administered 2017-10-18 (×3): 5 mg
  Filled 2017-10-18 (×3): qty 5

## 2017-10-18 MED ORDER — CLONAZEPAM 0.125 MG PO TBDP
1.0000 mg | ORAL_TABLET | Freq: Three times a day (TID) | ORAL | Status: DC
  Administered 2017-10-18 – 2017-10-19 (×4): 1 mg via ORAL
  Filled 2017-10-18: qty 8
  Filled 2017-10-18 (×2): qty 2
  Filled 2017-10-18: qty 8
  Filled 2017-10-18: qty 2

## 2017-10-18 MED ORDER — LEVOTHYROXINE SODIUM 100 MCG IV SOLR
37.5000 ug | Freq: Every day | INTRAVENOUS | Status: DC
  Administered 2017-10-18 – 2017-10-21 (×4): 37.5 ug via INTRAVENOUS
  Filled 2017-10-18 (×5): qty 5

## 2017-10-18 MED ORDER — CLONAZEPAM 0.1 MG/ML ORAL SUSPENSION
1.0000 mg | Freq: Three times a day (TID) | ORAL | Status: DC
  Filled 2017-10-18: qty 10

## 2017-10-18 MED ORDER — CEFAZOLIN SODIUM-DEXTROSE 2-4 GM/100ML-% IV SOLN
2.0000 g | INTRAVENOUS | Status: AC
  Administered 2017-10-19: 2 g via INTRAVENOUS
  Filled 2017-10-18: qty 100

## 2017-10-18 MED ORDER — CLONAZEPAM 1 MG PO TABS: 1.0000 mg | ORAL_TABLET | Freq: Three times a day (TID) | ORAL | Status: DC

## 2017-10-18 NOTE — Progress Notes (Addendum)
Inpatient Rehabilitation  Discussed case with surgical PA with plans for patient to return to IR tomorrow for conversion of G tube to G-J tube.  Called and discussed CIR with mom, who is still working on getting night time care for when she works Fri-Mon.  I also met with patient and at bedside to discuss team's recommendation for IP Rehab.  Shared booklets, insurance verification letter, and answered initial questions.  Plan to initiate insurance authorization with [employer] of [place] once there are therapy note updates from PT and OT.  Plan to update team when I have a decision.  Call if questions.    Carmelia Roller., CCC/SLP Admission Coordinator  [place]  Cell [phone]

## 2017-10-18 NOTE — Progress Notes (Signed)
[place] TEAM 1 - Stepdown/ICU TEAM  Philip Thompson  WCB:[id number] DOB: 1991-01-08 DOA: 09/21/2017 PCP: [name], MD    Brief Narrative:  27 year old male with a hx of polysubstance abuse, major depression, anxiety, chronic constipation, and hypothyroidism who presented to the ED w/ severe abdominal pain and distention with nausea and vomiting.  In the ED he was found to have colonic inertia/megacolon secondary to constipation, along with a pulmonary embolism.   Significant Events: 9/04 admit 9/10 total colectomy and ileostomy > massive distended colon from cecum all the way to the distal rectum and pelvic ring  9/10 intubation, unable to extubate 9/11 PICC line 9/15 placed retrievable IVC filter due to JP drain bleeding while on anti-coagulation 9/18 Shiley trach cuffed 9/22 started on Precedex for agitation, successfully weaned off on 9/23 9/23 JP drain out   Subjective: Awake and alert. In good spirits. Denies cp, sob, n/v. Is hungry. Wants more ice chips. Is anxious to get moving more.    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 - gastric atony  Ongoing care per Gen Surgery - NG now out w/ G tube in place - post-pyloric feeding continues via Cortrak - G tube to be converted to G-J tube 10/2  Acute hypoxic respiratory failure > tracheostomy secondary to PE, mucous plugging, and abdom compartment syndrome (initially) - resp care per PCCM - possible trach decannulation this week - doing well off vent - to begin capping trials today   Provoked PE/DVT  Due to bowel obstruction/abdominal compartment syndrome - initial anticoagulation was held due to bleed on 9/14 - s/p retrievable filter 9/15 - on full dose Lovenox - IR recommending to retrieve filter after 4 weeks (~10/15)  Persistent Sinus Tachycardia Timing is too late for withdrawal - likely due to deconditioning + PE + anxiety - was not previously on BB - TSH 3.1 - Mg is normal - serum  cortisol normal - has improved on BB   Sepsis due to Staph aureus and E. Coli in BAL Has completed a full course of abx tx   Anxiety/depression - Hx substance abuse on multiple psych meds - Psychiatry consulted and meds adjusted - affect is stable   Hypothyroidism continue Synthroid  Severe malnutrition in context of chronic illness on postpyloric tube feeding per Gen Surgery   DVT prophylaxis: lovenox  Code Status: FULL CODE Family Communication: no family present at time of exam today Disposition Plan: awaiting CIR bed - medically ready for CIR - to SDU today   Consultants:   General surgery  IR  PCCM  GI   Psychiatry  Antimicrobials:  Cefazolin 9/22 > 9/30  Objective: Blood pressure (!) 118/101, pulse (!) 102, temperature (!) 97.5 F (36.4 C), temperature source Axillary, resp. rate 15, height 5' 7.99" (1.727 m), weight 59.6 kg, SpO2 97 %.  Intake/Output Summary (Last 24 hours) at 10/18/2017 1033 Last data filed at 10/18/2017 1000 Gross per 24 hour  Intake 1437.42 ml  Output 3175 ml  Net -1737.58 ml   Filed Weights   10/16/17 0500 10/17/17 0348 10/18/17 0425  Weight: 61.6 kg 61.7 kg 59.6 kg    Examination: General: NAD - alert and conversant  Lungs: CTA B w/o wheezing or crackles  Cardiovascular: tachycardic at ~100 - regular - no M or rub  Abdomen: NT/ND, soft, BS+ - fully healed mildline incision  Extremities: No edema B LE   CBC: Recent Labs  Lab 10/16/17 0357 10/17/17 0351 10/18/17 0500  WBC 7.3 8.3 14.8*  HGB 13.8 15.0 15.4  HCT 43.8 46.5 48.0  MCV 89.0 88.2 88.1  PLT 357 379 676*   Basic Metabolic Panel: Recent Labs  Lab 10/13/17 0500  10/15/17 0434 10/17/17 0351 10/18/17 0500  NA 134*   < > 138 134* 135  K 5.1   < > 4.3 3.5 4.7  CL 99   < > 97* 89* 92*  CO2 26   < > 31 35* 32  GLUCOSE 240*   < > 107* 98 313*  BUN 16   < > 19 28* 43*  CREATININE 0.54*   < > 0.63 0.66 0.97  CALCIUM 9.2   < > 10.2 9.7 9.8  MG 2.1  --  2.0 1.9  2.0  PHOS 4.4  --  4.8* 4.1  --    < > = values in this interval not displayed.   GFR: Estimated Creatinine Clearance: 96.4 mL/min (by C-G formula based on SCr of 0.97 mg/dL).  Liver Function Tests: Recent Labs  Lab 10/13/17 0500 10/15/17 0434 10/17/17 0351  AST _0 ALT 46* 37 29  ALKPHOS 158* 187* 180*  BILITOT 0.3 0.5 0.6  PROT 6.9 8.1 7.7  ALBUMIN 2.5* 2.9* 3.1*    HbA1C: Hgb A1c MFr Bld  Date/Time Value Ref Range Status  03/27/2017 06:54 AM 5.4 4.8 - 5.6 % Final    Comment:    (NOTE) Pre diabetes:          5.7%-6.4% Diabetes:              >6.4% Glycemic control for   <7.0% adults with diabetes   07/02/2016 07:34 AM 5.4 4.8 - 5.6 % Final    Comment:    (NOTE)         Pre-diabetes: 5.7 - 6.4         Diabetes: >6.4         Glycemic control for adults with diabetes: <7.0     CBG: Recent Labs  Lab 10/17/17 1110 10/17/17 1924 10/17/17 2325 10/18/17 0421 10/18/17 0724  GLUCAP 131* 129* 128* 124* 116*    Scheduled Meds: . busPIRone  10 mg Per Tube BID  . chlorhexidine  15 mL Mouth Rinse BID  . Chlorhexidine Gluconate Cloth  6 each Topical Daily  . citalopram  20 mg Per Tube Daily  . clonazePAM  1 mg Per Tube Q8H  . enoxaparin (LOVENOX) injection  60 mg Subcutaneous Q12H  . famotidine  20 mg Per Tube BID  . fluticasone  2 spray Each Nare Daily  . free water  250 mL Per Tube Q6H  . gabapentin  300 mg Per Tube Q12H  . levothyroxine  75 mcg Per Tube QAC breakfast  . mouth rinse  15 mL Mouth Rinse q12n4p  . metoCLOPramide (REGLAN) injection  10 mg Intravenous Q6H  . neomycin-bacitracin-polymyxin  1 application Topical Daily  . nicotine  21 mg Transdermal Daily  . oxyCODONE  5 mg Per Tube Q6H  . propranolol  40 mg Per Tube TID  . QUEtiapine  200 mg Per Tube QHS  . sodium chloride flush  10-40 mL Intracatheter Q12H  . sodium chloride flush  3 mL Intravenous Q12H  . traZODone  100 mg Oral QHS  . valproic acid  500 mg Per Tube BID   Continuous  Infusions: . feeding supplement (VITAL AF 1.2 CAL) 1,500 mL (10/18/17 0830)     LOS: 26 days   Dellis Filbert T.  Thereasa Solo, MD [facility] Office  [phone] Pager - Text Page per Amion  If 7PM-7AM, please contact night-coverage per Amion 10/18/2017, 10:33 AM

## 2017-10-18 NOTE — Progress Notes (Signed)
Nutrition Follow-up  DOCUMENTATION CODES:   Severe malnutrition in context of chronic illness  INTERVENTION:   Tube Feeding:  Continue Vital AF 1.2 @ 80 ml/hr via Cortrak tube   NUTRITION DIAGNOSIS:   Severe Malnutrition related to chronic illness(cardiomyopathy, substance abuse, chronic constipation/obstipation with megacolon) as evidenced by severe fat depletion, severe muscle depletion.  Being addressed via TF   GOAL:   Patient will meet greater than or equal to 90% of their needs  Met  MONITOR:   TF tolerance, Diet advancement, Labs, Weight trends, I & O's, Skin  REASON FOR ASSESSMENT:   Ventilator    ASSESSMENT:   27 yo male admitted 9/4 for megacolon and severe obstructive constipation with severely dilated colon and colitis. Noted pt admitted to [facility] at the end of August with constipation x 3 weeks with megacolon with medical therapy attempts, discharged on 9/2. Pt also with chest pain with bilateral PE on [place] includes polysubstance abuse, drug-induced cardiomyopathy, anxiety, chronic obstipation/constipation  9/10 Total colectomy with ileostomy, Vent 9/11 PICC line inserted 9/12 TPN initiated, Extubated 9/13 Re-Intubated 9/16 Trickle feedings started 9/18 Trach placed, Cortrak  9/21 TPN re-started, NG tube for decompression, TF held 9/23 Diagnostic Radiology advanced Cortrak tube to post-pyloric position;  JP drain removed 9/26 Abdominal xray confirms Cortrak tube in transverse duodenum, NG tube in stomach 9/27 TPN discontinued, TF to goal rate today 9/30 IR placed G-tube  Tolerating ATC with PMV; plan for capping trials with possible decannulation later this wekk  G-tube in place, plan for conversion to G-J tube later this week. Plan to feed via Cortrak tube at present  Vital AF 1.2 @ 80 ml/hr Free water 250 mL q 6 hours  Weight continues to trend down; current wt 59.6 kg.   NG: 1 L, Ileosotmy: 725 mL  Per wound care RN today, pt's  previously documented stage III injury to sacrum is a dry healing scabbed area without depth or drainage  Labs: CBGs 116-131 Meds: reglan  Diet Order:   Diet Order            Diet NPO time specified Except for: Sips with Meds  Diet effective midnight        Diet NPO time specified Except for: Ice Chips  Diet effective now              EDUCATION NEEDS:   Not appropriate for education at this time  Skin:  Skin Assessment: Skin Integrity Issues: Skin Integrity Issues:: Stage III Stage III: intragluteal (coccygela) improved; now dry, healing scabbed area without depth of draiange Incisions: abdomen  Last BM:  stool via ileostomy   Height:   Ht Readings from Last 1 Encounters:  09/27/17 5' 7.99" (1.727 m)    Weight:   Wt Readings from Last 1 Encounters:  10/18/17 59.6 kg    Ideal Body Weight:  70 kg  BMI:  Body mass index is 19.98 kg/m.  Estimated Nutritional Needs:   Kcal:  2200-2400 kcals   Protein:  138-152 g  Fluid:  >/= 2 L   Kerman Passey MS, RD, LDN, CNSC [phone] Pager  [phone] Weekend/On-Call Pager

## 2017-10-18 NOTE — Progress Notes (Signed)
PCCM Interval Progress Note  No acute events.  Continues to tolerate ATC with PMV.  Was supposed to start capping trial yesterday 9/30 but never did.  Will start today.  If tolerates at least 24 hours then will decannulate later this week (suspect he will tolerate fine).   Discussed with Dr. Sharon Seller - no further PCCM needs today.  No charge.   Rutherford Guys, [place] - C  Pulmonary & Critical Care Medicine Pager: [phone]  or [phone] 10/18/2017, 10:37 AM

## 2017-10-18 NOTE — Progress Notes (Signed)
Physical Therapy Treatment Patient Details Name: Philip Thompson MRN: [id number] DOB: [birth year] Today's Date: 10/18/2017    History of Present Illness 27 y/o male admitted with colonic inertia/megacolon secondary to constipation and bilateral pulmonary emboli and DVT due to pelvic vein obstruction.  He had acute aspiration pneumonia on 9/10 prior to surgery in the setting of nausea/vomiting.  He went to the OR on 9/10 for total colectomy and ileostomy.  Extubated 9/12, Reintubated 9/13. 9/18 Trached and Cortrak placed PMH Anxiety/depression, hx of substance abuse disorder    PT Comments    Pt eager to get up with therapy today. Pt is making good progress towards his goals however continues to be limited in safe mobility by increased HR with activity, as well as decreased strength and endurance. Pt requires minA for bed mobility, transfers and ambulation. Pt HR reached a max of 144 bpm with slow ambulation. D/c plans remain appropriate at this time as pt requires increased intensity and regularity of therapy as well as medical support for extensive medical issues involving respiratory and GI systems, as well as SW and neuropsych support for underlying substance abuse disorder. CIR level rehab will facilitate return to his PLOF with mod I.    Follow Up Recommendations  CIR     Equipment Recommendations  Rolling walker with 5" wheels    Recommendations for Other Services       Precautions / Restrictions Precautions Precautions: Fall Precaution Comments: monitor sats and HR Required Braces or Orthoses: Other Brace/Splint Other Brace/Splint: NGT, coretrack, trach, PMV, ostomy Restrictions Weight Bearing Restrictions: No    Mobility  Bed Mobility Overal bed mobility: Needs Assistance Bed Mobility: Supine to Sit     Supine to sit: Supervision;Min assist     General bed mobility comments: Pt sitting up in chair   Transfers Overall transfer level: Needs assistance Equipment used:  None Transfers: Sit to/from Stand Sit to Stand: Min assist         General transfer comment: min A to steady   Ambulation/Gait Ambulation/Gait assistance: Min assist Gait Distance (Feet): 50 Feet Assistive device: Rolling walker (2 wheeled) Gait Pattern/deviations: Step-through pattern;Shuffle;Trunk flexed Gait velocity: decreased Gait velocity interpretation: <1.31 ft/sec, indicative of household ambulator General Gait Details: minA for steadying with RW, pt with slow shuffling gait vc for proximity to RW, max HR with ambulation 144 bpm, pt requires 1x standing rest break for 4/4 DoE         Balance Overall balance assessment: Needs assistance Sitting-balance support: Feet supported;No upper extremity supported Sitting balance-Leahy Scale: Good     Standing balance support: No upper extremity supported Standing balance-Leahy Scale: Fair Standing balance comment: Pt can stop and remove hands from RW in static standing.                             Cognition Arousal/Alertness: Awake/alert Behavior During Therapy: Flat affect Overall Cognitive Status: Impaired/Different from baseline Area of Impairment: Memory;Safety/judgement;Problem solving;Attention                   Current Attention Level: Selective Memory: Decreased short-term memory   Safety/Judgement: Decreased awareness of safety Awareness: Emergent Problem Solving: Slow processing;Requires verbal cues General Comments: discussed plan with pt, and left to get theraband.  Upon returning to room, pt asked "what are we doing now?" despite explanation provided earlier       Exercises General Exercises - Upper Extremity Shoulder Flexion: Strengthening;Right;Left;20 reps;Seated;Theraband Theraband  Level (Shoulder Flexion): Level 3 (Green) General Exercises - Lower Extremity Mini-Sqauts: 10 reps;Standing(with RW, HR increased to 138 bpm with exercise) Other Exercises Other Exercises: Pt  performed diagonals Rt and Lt with green theraband 20 reps each seated position     General Comments General comments (skin integrity, edema, etc.): HR to 137 with activity       Pertinent Vitals/Pain Pain Assessment: Faces Faces Pain Scale: Hurts little more Pain Location: back Pain Descriptors / Indicators: Aching Pain Intervention(s): Monitored during session;Patient requesting pain meds-RN notified           PT Goals (current goals can now be found in the care plan section) Acute Rehab PT Goals PT Goal Formulation: With patient Time For Goal Achievement: 10/21/17 Potential to Achieve Goals: Good Progress towards PT goals: Progressing toward goals    Frequency    Min 3X/week      PT Plan Current plan remains appropriate       AM-PAC PT "6 Clicks" Daily Activity  Outcome Measure  Difficulty turning over in bed (including adjusting bedclothes, sheets and blankets)?: A Lot Difficulty moving from lying on back to sitting on the side of the bed? : A Lot Difficulty sitting down on and standing up from a chair with arms (e.g., wheelchair, bedside commode, etc,.)?: Unable Help needed moving to and from a bed to chair (including a wheelchair)?: A Little Help needed walking in hospital room?: A Little Help needed climbing 3-5 steps with a railing? : A Little 6 Click Score: 14    End of Session Equipment Utilized During Treatment: Gait belt Activity Tolerance: Patient limited by fatigue;Patient limited by pain Patient left: in chair;with call bell/phone within reach(OT in room ) Nurse Communication: Patient requests pain meds PT Visit Diagnosis: Unsteadiness on feet (R26.81);Other abnormalities of gait and mobility (R26.89);Muscle weakness (generalized) (M62.81);Difficulty in walking, not elsewhere classified (R26.2);Pain Pain - Right/Left: (lower) Pain - part of body: (back and abdomen)     Time: 4098-1191 PT Time Calculation (min) (ACUTE ONLY): 22 min  Charges:   $Gait Training: 8-22 mins                     Eulalio Reamy B. Beverely Risen PT, DPT Acute Rehabilitation Services Pager [phone] Office [phone]    [name] 10/18/2017, 3:54 PM

## 2017-10-18 NOTE — Care Management Note (Signed)
Case Management Note Previous CM note completed by Leone Haven, RN 10/07/2017, 1:21 PM   Patient Details  Name: Philip Thompson MRN: [id number] Date of Birth: Sep 09, 1990  Subjective/Objective:  From home, pta indep, Presents with colonic inertia/megacolon secondary to constipation and bilateral pulmonary emboli and DVT due to pelvic vein obstruction.  He had acute aspiration pneumonia on 9/10 prior to surgery in the setting of nausea/vomiting.  He went to the OR on 9/10 for total colectomy and ileostomy.   put back on vent ,PT rec CIR.                 Action/Plan: NCM will follow for transition of care needs.   Expected Discharge Date:                  Expected Discharge Plan:  IP Rehab Facility  In-House Referral:  Clinical Social Work  Discharge planning Services  CM Consult  Post Acute Care Choice:  IP Rehab Choice offered to:  Patient, Parent  DME Arranged:    DME Agency:     HH Arranged:    HH Agency:     Status of Service:  In process, will continue to follow  If discussed at Long Length of Stay Meetings, dates discussed:  10/1  Discharge Disposition:   Additional Comments:  10/18/17- 1350- Philip Fiebelkorn RN, CM-  Pt now with trach on 9/18 after unable to extubate post procedure. Continues to have high output from NGT- had G tube placed in IR 9/30- NGT now out- plan to convert to G-J tube this week in IR 10/2. CIR continues to follow for medical readiness to admit to rehab.  CM will follow.   Philip Thompson [place], RN 10/18/2017, 1:52 PM [phone] 5M Transition Care Coordinator

## 2017-10-18 NOTE — Progress Notes (Signed)
Occupational Therapy Treatment Patient Details Name: Philip Thompson MRN: [id number] DOB: [birth year] Today's Date: 10/18/2017    History of present illness 27 y/o male admitted with colonic inertia/megacolon secondary to constipation and bilateral pulmonary emboli and DVT due to pelvic vein obstruction.  He had acute aspiration pneumonia on 9/10 prior to surgery in the setting of nausea/vomiting.  He went to the OR on 9/10 for total colectomy and ileostomy.  Extubated 9/12, Reintubated 9/13. 9/18 Trached and Cortrak placed PMH Anxiety/depression, hx of substance abuse disorder   OT comments  Pt is progressing nicely toward OT goals.  He is able to perform ADLs with min A, however, requires rest breaks due to HR to 137.   He performed theraband exercises in seated position.  Continue to recommend CIR as pt needs the intensity, consistency, and comprehensive services that are provided at [facility]- as he will benefit from SW and neuropsych interventions in addition to PT/OT/SLP .   Given his age and level of independence PTA, feel CIR will allow him to return to mod I upon discharge.    Follow Up Recommendations  CIR;Supervision/Assistance - 24 hour    Equipment Recommendations  None recommended by OT    Recommendations for Other Services      Precautions / Restrictions Precautions Precautions: Fall Precaution Comments: monitor sats and HR Required Braces or Orthoses: Other Brace/Splint Other Brace/Splint: NGT, coretrack, trach, PMV, ostomy Restrictions Weight Bearing Restrictions: No       Mobility Bed Mobility Overal bed mobility: Needs Assistance       Supine to sit: Supervision;Min assist     General bed mobility comments: Pt sitting up in chair   Transfers Overall transfer level: Needs assistance Equipment used: None Transfers: Sit to/from Stand Sit to Stand: Min assist         General transfer comment: min A to steady     Balance Overall balance assessment: Needs  assistance Sitting-balance support: Feet supported;No upper extremity supported Sitting balance-Leahy Scale: Good     Standing balance support: No upper extremity supported Standing balance-Leahy Scale: Fair Standing balance comment: Pt can stop and remove hands from RW in static standing.                            ADL either performed or assessed with clinical judgement   ADL Overall ADL's : Needs assistance/impaired Eating/Feeding: NPO           Lower Body Bathing: Minimal assistance;Sit to/from stand       Lower Body Dressing: Minimal assistance;Sit to/from stand   Toilet Transfer: Minimal assistance;Ambulation;Comfort height toilet;RW Statistician Details (indicate cue type and reason): assist to safely maneuver RW in small spaces  Toileting- Clothing Manipulation and Hygiene: Moderate assistance;Sit to/from stand       Functional mobility during ADLs: Minimal assistance;Min guard;Rolling walker       Vision       Perception     Praxis      Cognition Arousal/Alertness: Awake/alert Behavior During Therapy: Flat affect Overall Cognitive Status: Impaired/Different from baseline Area of Impairment: Memory;Safety/judgement;Problem solving;Attention                   Current Attention Level: Selective Memory: Decreased short-term memory   Safety/Judgement: Decreased awareness of safety Awareness: Emergent Problem Solving: Slow processing;Requires verbal cues General Comments: discussed plan with pt, and left to get theraband.  Upon returning to room, pt asked "what are we  doing now?" despite explanation provided earlier         Exercises Exercises: General Upper Extremity General Exercises - Upper Extremity Shoulder Flexion: Strengthening;Right;Left;20 reps;Seated;Theraband Theraband Level (Shoulder Flexion): Level 3 (Green) Other Exercises Other Exercises: Pt performed diagonals Rt and Lt with green theraband 20 reps each seated  position    Shoulder Instructions       General Comments HR to 137 with activity     Pertinent Vitals/ Pain       Pain Assessment: Faces Faces Pain Scale: Hurts little more Pain Location: back Pain Descriptors / Indicators: Aching Pain Intervention(s): Monitored during session;Patient requesting pain meds-RN notified  Home Living                                          Prior Functioning/Environment              Frequency  Min 2X/week        Progress Toward Goals  OT Goals(current goals can now be found in the care plan section)  Progress towards OT goals: Progressing toward goals     Plan Discharge plan remains appropriate    Co-evaluation                 AM-PAC PT "6 Clicks" Daily Activity     Outcome Measure   Help from another person eating meals?: Total Help from another person taking care of personal grooming?: A Little Help from another person toileting, which includes using toliet, bedpan, or urinal?: A Little Help from another person bathing (including washing, rinsing, drying)?: A Little Help from another person to put on and taking off regular upper body clothing?: A Little Help from another person to put on and taking off regular lower body clothing?: A Little 6 Click Score: 16    End of Session    OT Visit Diagnosis: Cognitive communication deficit (R41.841);Other abnormalities of gait and mobility (R26.89);Unsteadiness on feet (R26.81);Muscle weakness (generalized) (M62.81);Pain   Activity Tolerance Patient tolerated treatment well   Patient Left in chair;with call bell/phone within reach   Nurse Communication Patient requests pain meds        Time: 7322-0254 OT Time Calculation (min): 21 min  Charges: OT General Charges $OT Visit: 1 Visit OT Treatments $Therapeutic Exercise: 8-22 mins  Jeani Hawking, OTR/L Acute Rehabilitation Services Pager [phone] Office [phone]    Jeani Hawking  M 10/18/2017, 2:08 PM

## 2017-10-18 NOTE — Consult Note (Signed)
WOC Nurse ostomy follow up Demonstrated pouch change to patient.  He assisted with the procedure and asked appropriate questions. Stoma type/location: Ileostomy stoma is red and viable, flush with skin level, 1 1/4 inches, loose skin folds surrounding at 9:00 o'clock and 3:00 o'clock Peristomal assessment: intact skin surrounding Output: Mod amt green liquid stool  Ostomy pouching: 1pc.  Education provided:  Applied one piece flexible convex pouch with barrier ring to attempt to maintain a seal.  Pt was able to open and close velcro to empty.  No family members at the bedside, but pt states he plans to be independent with the process after discharge.  WOC team will continue teaching sessions twice a week.  Supplies ordered to the bedside for staff nurse use.  Reviewed pouching routines. Enrolled patient in [place] Secure Start Discharge program: No  WOC Nurse wound consult note Reason for Consult: Pt previously had a stage 3 pressure injury to sacrum.  Upon assessment today, it is a dry healing scabbed area without depth or drainage Dressing procedure/placement/frequency: Foam dressing to protect from further injury.  No further topical treatment is indicated. Cammie Mcgee MSN, RN, CWOCN, [place], CNS [phone]

## 2017-10-18 NOTE — Progress Notes (Signed)
Patient ID: Philip Thompson, male   DOB: [birth year], 27 y.o.   MRN: [id number]    21 Days Post-Op  Subjective: Patient doing well after g-tube placement by IR.  Objective: Vital signs in last 24 hours: Temp:  [96.3 F (35.7 C)-97.5 F (36.4 C)] 97.5 F (36.4 C) (10/01 0745) Pulse Rate:  [109-150] 113 (10/01 0745) Resp:  [12-24] 15 (10/01 0745) BP: (91-119)/(61-97) 116/61 (10/01 0745) SpO2:  [92 %-100 %] 100 % (10/01 0745) FiO2 (%):  [21 %] 21 % (10/01 0005) Weight:  [59.6 kg] 59.6 kg (10/01 0425) Last BM Date: 10/17/17  Intake/Output from previous day: 09/30 0701 - 10/01 0700 In: 745.8 [I.V.:585.8; NG/GT:60; IV Piggyback:100] Out: 2025 [Urine:250; Emesis/NG output:1050; Stool:725] Intake/Output this shift: No intake/output data recorded.  PE: Heart: tachy Abd: soft, NT, g-tube in place with no output as NGT still in place to suction as well.  NGT with over 1L out overnight.  Cortrak in place as well  Lab Results:  Recent Labs    10/17/17 0351 10/18/17 0500  WBC 8.3 14.8*  HGB 15.0 15.4  HCT 46.5 48.0  PLT 379 465*   BMET Recent Labs    10/17/17 0351 10/18/17 0500  NA 134* 135  K 3.5 4.7  CL 89* 92*  CO2 35* 32  GLUCOSE 98 313*  BUN 28* 43*  CREATININE 0.66 0.97  CALCIUM 9.7 9.8   PT/INR Recent Labs    10/17/17 0351  LABPROT 14.3  INR 1.12   CMP     Component Value Date/Time   NA 135 10/18/2017 0500   K 4.7 10/18/2017 0500   CL 92 (L) 10/18/2017 0500   CO2 32 10/18/2017 0500   GLUCOSE 313 (H) 10/18/2017 0500   BUN 43 (H) 10/18/2017 0500   CREATININE 0.97 10/18/2017 0500   CALCIUM 9.8 10/18/2017 0500   PROT 7.7 10/17/2017 0351   ALBUMIN 3.1 (L) 10/17/2017 0351   AST 21 10/17/2017 0351   ALT 29 10/17/2017 0351   ALKPHOS 180 (H) 10/17/2017 0351   BILITOT 0.6 10/17/2017 0351   GFRNONAA >60 10/18/2017 0500   GFRAA >60 10/18/2017 0500   Lipase     Component Value Date/Time   LIPASE 37 09/21/2017 2237       Studies/Results: Ir  Gastrostomy Tube Mod Sed  Result Date: 10/17/2017 INDICATION: 27 year old male with dysphagia. EXAM: IMAGE GUIDED PERCUTANEOUS GASTROSTOMY MEDICATIONS: 2.0 g Ancef; Antibiotics were administered within 1 hour of the procedure. ANESTHESIA/SEDATION: Versed 2.0 mg IV; Fentanyl 125 mcg IV Moderate Sedation Time:  10 minutes The patient was continuously monitored during the procedure by the interventional radiology nurse under my direct supervision. CONTRAST:  20 cc-administered into the gastric lumen. FLUOROSCOPY TIME:  Fluoroscopy Time: 1 minutes 18 seconds (13 mGy). COMPLICATIONS: None PROCEDURE: Informed written consent was obtained from the patient's family after a thorough discussion of the procedural risks, benefits and alternatives. All questions were addressed. Maximal Sterile Barrier Technique was utilized including caps, mask, sterile gowns, sterile gloves, sterile drape, hand hygiene and skin antiseptic. A timeout was performed prior to the initiation of the procedure. The procedure, risks, benefits, and alternatives were explained to the patient. Questions regarding the procedure were encouraged and answered. The patient understands and consents to the procedure. The epigastrium was prepped with Betadine in a sterile fashion, and a sterile drape was applied covering the operative field. A sterile gown and sterile gloves were used for the procedure. A 5-French orogastric tube is placed under fluoroscopic guidance. Scout  imaging of the abdomen confirms barium within the transverse colon. The stomach was distended with gas. Under fluoroscopic guidance, an 18 gauge needle was utilized to puncture the anterior wall of the body of the stomach. Note that the needle was immediately adjacent to the inferior costal angle, and the gentle right to left needle angle was required for adequate puncture of the anterior stomach. An Amplatz wire was advanced through the needle passing a T fastener into the lumen of the  stomach. The T fastener was secured for gastropexy. A 9-French sheath was inserted. A snare was advanced through the 9-French sheath. A Teena Dunk was advanced through the orogastric tube. It was snared then pulled out the oral cavity, pulling the snare, as well. The leading edge of the gastrostomy was attached to the snare. It was then pulled down the esophagus and out the percutaneous site. It was secured in place. Contrast was injected. No complication IMPRESSION: Status post percutaneous gastrostomy placement. Signed, Yvone Neu. Reyne Dumas, RPVI [facility] Radiology Electronically Signed   By: Gilmer Mor D.O.   On: 10/17/2017 16:07    Anti-infectives: Anti-infectives (From admission, onward)   Start     Dose/Rate Route Frequency Ordered Stop   10/17/17 1700  ceFAZolin (ANCEF) IVPB 2g/100 mL premix     2 g 200 mL/hr over 30 Minutes Intravenous  Once 10/17/17 1608 10/17/17 1545   10/17/17 1615  ceFAZolin (ANCEF) IVPB 2g/100 mL premix  Status:  Discontinued     2 g 200 mL/hr over 30 Minutes Intravenous Every 8 hours 10/17/17 1606 10/17/17 1607   10/17/17 1510  ceFAZolin (ANCEF) 2-4 GM/100ML-% IVPB    Note to Pharmacy:  [name]   : cabinet override      10/17/17 1510 10/18/17 0314   10/09/17 1730  ceFAZolin (ANCEF) IVPB 2g/100 mL premix     2 g 200 mL/hr over 30 Minutes Intravenous Every 8 hours 10/09/17 1641 10/16/17 1745   10/08/17 0815  erythromycin ethylsuccinate (EES) 200 MG/5ML suspension 200 mg  Status:  Discontinued     200 mg Per Tube Every 6 hours 10/08/17 0807 10/09/17 1636   09/27/17 1000  piperacillin-tazobactam (ZOSYN) IVPB 3.375 g  Status:  Discontinued     3.375 g 12.5 mL/hr over 240 Minutes Intravenous Every 8 hours 09/27/17 0845 10/06/17 1504       Assessment/Plan S/P subtotal colectomy with ileostomy 9/10 Dr. Magnus Ivan-  -Gastric atony, g-tube to gravity.  Dc NGT -output still high -IR plans for GJ conversion  later in the week hopefully. -still on reglan -may resume tube feeds down Cortrak.  Will DC cortrak once G tube converted to GJ tube. -may have ice PE- Lovenox Acute resp failure- per CCM Tachycardia/SVT- per medicine PT/OT- rec CIR  FEN -NPO x ice/g-tube for gravity/Cortrak with TFs to resume today VTE -Lovenox ID -none currently   LOS: 26 days    Letha Cape , [facility] Surgery 10/18/2017, 8:14 AM Pager: [phone]

## 2017-10-18 NOTE — Progress Notes (Signed)
Referring Physician(s): Dr Maretta Bees  Supervising Physician: Simonne Come  Patient Status:  [facility] - In-pt  Chief Complaint:  S/P subtotal colectomy with ileostomy 9/10 Dr. Magnus Ivan  Subjective:  Gastric atony Percutaneous gastric tube placed in IR 9/30 Pt is up in chair Feeling well Tired from yesterday  Request made for conversion to G to GJ per [facility] Discussed with Dr Grace Isaac Can plan for procedure asap Plan for 10/2 in IR-- pt is wiped out today   Allergies: Patient has no known allergies.  Medications: Prior to Admission medications   Medication Sig Start Date End Date Taking? Authorizing Provider  busPIRone (BUSPAR) 15 MG tablet Take 1 tablet (15 mg total) by mouth 3 (three) times daily. 09/07/17  Yes Eksir, Bo Mcclintock, MD  citalopram (CELEXA) 40 MG tablet Take 1 tablet (40 mg total) by mouth daily. 09/07/17 09/07/18 Yes Eksir, Bo Mcclintock, MD  cyclobenzaprine (FLEXERIL) 10 MG tablet Take 1 tablet (10 mg total) by mouth 3 (three) times daily as needed for muscle spasms. 03/29/17  Yes Armandina Stammer I, NP  divalproex (DEPAKOTE) 500 MG DR tablet Take 1 tablet (500 mg total) by mouth every 12 (twelve) hours. For mood stabilization 09/07/17  Yes Eksir, Bo Mcclintock, MD  docusate sodium (COLACE) 100 MG capsule Take 1 capsule (100 mg total) by mouth 2 (two) times daily. Patient taking differently: Take 100 mg by mouth daily.  09/19/17  Yes Tat, Onalee Hua, MD  furosemide (LASIX) 20 MG tablet Take 1 tablet (20 mg total) by mouth daily. Prn leg swelling Patient taking differently: Take 20 mg by mouth daily as needed for fluid or edema. As needed for leg swelling 05/11/17  Yes Sherie Don C, NP  gabapentin (NEURONTIN) 300 MG capsule Take 2 capsules (600 mg total) by mouth 3 (three) times daily. For agitation 09/07/17 09/07/18 Yes Eksir, Bo Mcclintock, MD  ibuprofen (ADVIL,MOTRIN) 600 MG tablet Take 1 tablet (600 mg total) by mouth every 6 (six) hours as needed for moderate pain.  (May buy from over the counter): For pain 03/29/17  Yes Armandina Stammer I, NP  levothyroxine (SYNTHROID) 50 MCG tablet Take 1 tablet (50 mcg total) by mouth daily before breakfast. 09/19/17  Yes Tat, Onalee Hua, MD  linaclotide [facility]) 290 MCG CAPS capsule Take 1 capsule (290 mcg total) by mouth daily before breakfast. 09/20/17  Yes Tat, Onalee Hua, MD  potassium chloride (K-DUR,KLOR-CON) 10 MEQ tablet Take 1 tablet (10 mEq total) by mouth daily. With furosemide Patient taking differently: Take 10 mEq by mouth daily as needed (only takes when Lasix (Furosemide)). With furosemide 05/11/17  Yes Campbell Riches, NP  QUEtiapine (SEROQUEL) 400 MG tablet Take 2 tablets (800 mg total) by mouth at bedtime. 09/07/17  Yes Eksir, Bo Mcclintock, MD  traMADol (ULTRAM) 50 MG tablet Take 50 mg by mouth every 12 (twelve) hours as needed for moderate pain or severe pain.  05/03/17  Yes [provider]  traZODone (DESYREL) 100 MG tablet Take 1 tablet (100 mg total) by mouth at bedtime. 09/07/17  Yes Eksir, Bo Mcclintock, MD     Vital Signs: BP (!) 118/101   Pulse (!) 102   Temp (!) 97.5 F (36.4 C) (Axillary)   Resp 15   Ht 5' 7.99" (1.727 m)   Wt 131 lb 6.3 oz (59.6 kg)   SpO2 97%   BMI 19.98 kg/m   Physical Exam  Constitutional: He is oriented to person, place, and time.  Cardiovascular: Normal rate and regular rhythm.  Pulmonary/Chest: Effort normal and breath sounds normal.  Abdominal: Soft. Bowel sounds are normal. There is tenderness.  Musculoskeletal: Normal range of motion.  Neurological: He is alert and oriented to person, place, and time.  Skin: Skin is warm and dry.  G tube site is clean and dry Sl tender No bleeding  Psychiatric: He has a normal mood and affect. His behavior is normal. Judgment and thought content normal.  Vitals reviewed.   Imaging: Ir Gastrostomy Tube Mod Sed  Result Date: 10/17/2017 INDICATION: 26 year old male with dysphagia. EXAM: IMAGE GUIDED PERCUTANEOUS  GASTROSTOMY MEDICATIONS: 2.0 g Ancef; Antibiotics were administered within 1 hour of the procedure. ANESTHESIA/SEDATION: Versed 2.0 mg IV; Fentanyl 125 mcg IV Moderate Sedation Time:  10 minutes The patient was continuously monitored during the procedure by the interventional radiology nurse under my direct supervision. CONTRAST:  20 cc-administered into the gastric lumen. FLUOROSCOPY TIME:  Fluoroscopy Time: 1 minutes 18 seconds (13 mGy). COMPLICATIONS: None PROCEDURE: Informed written consent was obtained from the patient's family after a thorough discussion of the procedural risks, benefits and alternatives. All questions were addressed. Maximal Sterile Barrier Technique was utilized including caps, mask, sterile gowns, sterile gloves, sterile drape, hand hygiene and skin antiseptic. A timeout was performed prior to the initiation of the procedure. The procedure, risks, benefits, and alternatives were explained to the patient. Questions regarding the procedure were encouraged and answered. The patient understands and consents to the procedure. The epigastrium was prepped with Betadine in a sterile fashion, and a sterile drape was applied covering the operative field. A sterile gown and sterile gloves were used for the procedure. A 5-French orogastric tube is placed under fluoroscopic guidance. Scout imaging of the abdomen confirms barium within the transverse colon. The stomach was distended with gas. Under fluoroscopic guidance, an 18 gauge needle was utilized to puncture the anterior wall of the body of the stomach. Note that the needle was immediately adjacent to the inferior costal angle, and the gentle right to left needle angle was required for adequate puncture of the anterior stomach. An Amplatz wire was advanced through the needle passing a T fastener into the lumen of the stomach. The T fastener was secured for gastropexy. A 9-French sheath was inserted. A snare was advanced through the 9-French sheath.  A Teena Dunk was advanced through the orogastric tube. It was snared then pulled out the oral cavity, pulling the snare, as well. The leading edge of the gastrostomy was attached to the snare. It was then pulled down the esophagus and out the percutaneous site. It was secured in place. Contrast was injected. No complication IMPRESSION: Status post percutaneous gastrostomy placement. Signed, Yvone Neu. Reyne Dumas, RPVI [facility]ascular [facility] Interventional Radiology Specialists [facility] Radiology Electronically Signed   By: Gilmer Mor D.O.   On: 10/17/2017 16:07    Labs:  CBC: Recent Labs    10/15/17 0434 10/16/17 0357 10/17/17 0351 10/18/17 0500  WBC 8.9 7.3 8.3 14.8*  HGB 13.1 13.8 15.0 15.4  HCT 41.7 43.8 46.5 48.0  PLT 342 357 379 465*    COAGS: Recent Labs    09/18/17 0953 09/21/17 2237 09/26/17 0626 10/02/17 1030 10/17/17 0351  INR 1.09 1.10 1.26 1.02 1.12  APTT 33 37*  --   --   --     BMP: Recent Labs    10/14/17 1219 10/15/17 0434 10/17/17 0351 10/18/17 0500  NA 136 138 134* 135  K 4.4 4.3 3.5 4.7  CL 100 97* 89* 92*  CO2 26  31 35* 32  GLUCOSE 151* 107* 98 313*  BUN 16 19 28* 43*  CALCIUM 9.3 10.2 9.7 9.8  CREATININE 0.56* 0.63 0.66 0.97  GFRNONAA >60 >60 >60 >60  GFRAA >60 >60 >60 >60    LIVER FUNCTION TESTS: Recent Labs    10/11/17 0557 10/13/17 0500 10/15/17 0434 10/17/17 0351  BILITOT 0.4 0.3 0.5 0.6  AST 20 26 24 21   ALT 50* 46* 37 29  ALKPHOS 116 158* 187* 180*  PROT 6.3* 6.9 8.1 7.7  ALBUMIN 2.3* 2.5* 2.9* 3.1*    Assessment and Plan:  Gastric atony- post surgical colectomy; ileostomy 09/27/17 Perc G tube placed in IR 9/30 Plan for conversion to GJ 10/2 Pt is aware of procedure beneits and risks including but not limited to Infection; bleeding; organ damage Agreeable to proceed Consent signed and in chart  Electronically Signed: Gardenia Witter A, PA-C 10/18/2017, 10:43 AM   I spent a total of 25 Minutes at the the patient's bedside AND  on the patient's hospital floor or unit, greater than 50% of which was counseling/coordinating care for G tube -- now conversion to GJ

## 2017-10-18 NOTE — Progress Notes (Signed)
  Speech Language Pathology Treatment: Hillary Bow Speaking valve  Patient Details Name: Philip Thompson MRN: [id number] DOB: 1990/01/29 Today's Date: 10/18/2017 Time: 0950-1004 SLP Time Calculation (min) (ACUTE ONLY): 14 min  Assessment / Plan / Recommendation Clinical Impression  Pt had PMV in place upon SLP arrival, and says that he wears it approximately half of his waking hours, otherwise using digital occlusion. SLP reinforced benefits of utilizing PMV. [name] phonation sounds clear and strong to SLP, but he reports that he thinks he sounds like he has a "sore throat." Pt asked several questions about care and maintenance of PMV. Training was initiated with SLP, with education focusing mostly on cleaning and times to wear valve. Recommend that he wear it during waking hours with intermittent supervision.    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  He had acute aspiration pneumonia on 9/10 prior to surgery in the setting of nausea/vomiting.  He went to the OR on 9/10 for total colectomy and ileostomy.  Extubated 9/12, Reintubated 9/13. 9/18 Trached and Cortrak placed PMH Anxiety/depression, hx of substance abuse disorder.  CXR 9/23: "Right lower lobe collapse/consolidative change. Given volume loss, at least partially felt to represent atelectasis. Infection or aspiration cannot be excluded."      SLP Plan  Continue with current plan of care       Recommendations         Patient may use Passy-Muir Speech Valve: During all waking hours (remove during sleep);During all therapies with supervision;Caregiver trained to provide supervision PMSV Supervision: Intermittent MD: Please consider changing trach tube to : Cuffless         Follow up Recommendations: Inpatient Rehab SLP Visit Diagnosis: Aphonia (R49.1) Plan: Continue with current plan of care       GO                 Maxcine Ham 10/18/2017, 10:27 AM  Maxcine Ham, M.A. CCC-SLP Acute Herbalist [phone] Office [phone]

## 2017-10-19 ENCOUNTER — Encounter (HOSPITAL_COMMUNITY): Payer: Self-pay | Admitting: Interventional Radiology

## 2017-10-19 ENCOUNTER — Inpatient Hospital Stay (HOSPITAL_COMMUNITY): Payer: BLUE CROSS/BLUE SHIELD

## 2017-10-19 DIAGNOSIS — I824Y9 Acute embolism and thrombosis of unspecified deep veins of unspecified proximal lower extremity: Secondary | ICD-10-CM

## 2017-10-19 DIAGNOSIS — Z4659 Encounter for fitting and adjustment of other gastrointestinal appliance and device: Secondary | ICD-10-CM

## 2017-10-19 HISTORY — PX: IR GASTR TUBE CONVERT GASTR-JEJ PER W/FL MOD SED: IMG2332

## 2017-10-19 LAB — COMPREHENSIVE METABOLIC PANEL
ALT: 31 U/L (ref 0–44)
AST: 23 U/L (ref 15–41)
Albumin: 3.4 g/dL — ABNORMAL LOW (ref 3.5–5.0)
Alkaline Phosphatase: 162 U/L — ABNORMAL HIGH (ref 38–126)
Anion gap: 14 (ref 5–15)
BUN: 45 mg/dL — ABNORMAL HIGH (ref 6–20)
CO2: 35 mmol/L — ABNORMAL HIGH (ref 22–32)
Calcium: 10.8 mg/dL — ABNORMAL HIGH (ref 8.9–10.3)
Chloride: 81 mmol/L — ABNORMAL LOW (ref 98–111)
Creatinine, Ser: 0.92 mg/dL (ref 0.61–1.24)
GFR calc Af Amer: >60 mL/min (ref >60)
GFR calc non Af Amer: >60 mL/min (ref >60)
Glucose, Bld: 114 mg/dL — ABNORMAL HIGH (ref 70–99)
Potassium: 3.5 mmol/L (ref 3.5–5.1)
Sodium: 130 mmol/L — ABNORMAL LOW (ref 135–145)
Total Bilirubin: 0.8 mg/dL (ref 0.3–1.2)
Total Protein: 8.2 g/dL — ABNORMAL HIGH (ref 6.5–8.1)

## 2017-10-19 LAB — GLUCOSE, CAPILLARY
GLUCOSE-CAPILLARY: 132 mg/dL — AB (ref 70–99)
Glucose-Capillary: 116 mg/dL — ABNORMAL HIGH (ref 70–99)
Glucose-Capillary: 115 mg/dL — ABNORMAL HIGH (ref 70–99)
Glucose-Capillary: 92 mg/dL (ref 70–99)

## 2017-10-19 LAB — CBC
HCT: 49 % (ref 39.0–52.0)
Hemoglobin: 15.8 g/dL (ref 13.0–17.0)
MCH: 28.1 pg (ref 26.0–34.0)
MCHC: 32.2 g/dL (ref 30.0–36.0)
MCV: 87 fL (ref 78.0–100.0)
Platelets: 437 10*3/uL — ABNORMAL HIGH (ref 150–400)
RBC: 5.63 MIL/uL (ref 4.22–5.81)
RDW: 13.2 % (ref 11.5–15.5)
WBC: 12.9 10*3/uL — ABNORMAL HIGH (ref 4.0–10.5)

## 2017-10-19 LAB — PHOSPHORUS: PHOSPHORUS: 3 mg/dL (ref 2.5–4.6)

## 2017-10-19 LAB — MAGNESIUM: Magnesium: 2.1 mg/dL (ref 1.7–2.4)

## 2017-10-19 MED ORDER — OXYCODONE HCL 5 MG/5ML PO SOLN
10.0000 mg | ORAL | Status: DC | PRN
  Administered 2017-10-19 – 2017-10-20 (×3): 10 mg
  Filled 2017-10-19 (×4): qty 10

## 2017-10-19 MED ORDER — LIDOCAINE VISCOUS HCL 2 % MT SOLN
OROMUCOSAL | Status: AC
  Filled 2017-10-19: qty 15

## 2017-10-19 MED ORDER — FLUTICASONE PROPIONATE 50 MCG/ACT NA SUSP: 2.0000 | Freq: Every day | NASAL | 2 refills | Status: AC

## 2017-10-19 MED ORDER — MIDAZOLAM HCL 2 MG/2ML IJ SOLN
INTRAMUSCULAR | Status: AC
  Filled 2017-10-19: qty 2

## 2017-10-19 MED ORDER — LIDOCAINE HCL 1 % IJ SOLN
INTRAMUSCULAR | Status: AC | PRN
  Administered 2017-10-19: 5 mL

## 2017-10-19 MED ORDER — IOPAMIDOL (ISOVUE-300) INJECTION 61%
INTRAVENOUS | Status: AC
  Administered 2017-10-19: 20 mL
  Filled 2017-10-19: qty 50

## 2017-10-19 MED ORDER — LIDOCAINE HCL 1 % IJ SOLN
INTRAMUSCULAR | Status: AC
  Filled 2017-10-19: qty 20

## 2017-10-19 MED ORDER — FENTANYL CITRATE (PF) 100 MCG/2ML IJ SOLN
INTRAMUSCULAR | Status: AC | PRN
  Administered 2017-10-19: 50 ug via INTRAVENOUS

## 2017-10-19 MED ORDER — ORAL CARE MOUTH RINSE: 15.0000 mL | Freq: Two times a day (BID) | OROMUCOSAL | 0 refills | Status: DC

## 2017-10-19 MED ORDER — VITAL HIGH PROTEIN PO LIQD
1000.0000 mL | ORAL | Status: DC
  Administered 2017-10-19: 1000 mL
  Filled 2017-10-19 (×2): qty 1000

## 2017-10-19 MED ORDER — METOCLOPRAMIDE HCL 5 MG/ML IJ SOLN: 10.0000 mg | Freq: Four times a day (QID) | INTRAMUSCULAR | Status: DC

## 2017-10-19 MED ORDER — OXYCODONE HCL 5 MG/5ML PO SOLN: 5.0000 mg | Freq: Four times a day (QID) | ORAL | 0 refills | Status: DC

## 2017-10-19 MED ORDER — CLONAZEPAM 1 MG PO TBDP: 1.0000 mg | ORAL_TABLET | Freq: Three times a day (TID) | ORAL | Status: DC

## 2017-10-19 MED ORDER — ACETAMINOPHEN 160 MG/5ML PO SOLN: 650.0000 mg | Freq: Four times a day (QID) | ORAL | 0 refills | Status: DC | PRN

## 2017-10-19 MED ORDER — LEVALBUTEROL HCL 0.63 MG/3ML IN NEBU: 0.6300 mg | INHALATION_SOLUTION | RESPIRATORY_TRACT | 12 refills | Status: DC | PRN

## 2017-10-19 MED ORDER — HYDROXYZINE HCL 10 MG/5ML PO SYRP: 10.0000 mg | ORAL_SOLUTION | Freq: Three times a day (TID) | ORAL | Status: DC | PRN

## 2017-10-19 MED ORDER — NICOTINE 21 MG/24HR TD PT24: 21.0000 mg | MEDICATED_PATCH | Freq: Every day | TRANSDERMAL | 0 refills | Status: DC

## 2017-10-19 MED ORDER — FENTANYL CITRATE (PF) 100 MCG/2ML IJ SOLN
INTRAMUSCULAR | Status: AC
  Filled 2017-10-19: qty 2

## 2017-10-19 MED ORDER — BACITRACIN-NEOMYCIN-POLYMYXIN 400-5-5000 EX OINT: 1.0000 "application " | TOPICAL_OINTMENT | Freq: Every day | CUTANEOUS | 0 refills | Status: DC

## 2017-10-19 MED ORDER — VALPROIC ACID 250 MG/5ML PO SOLN: 500.0000 mg | Freq: Two times a day (BID) | ORAL | Status: DC

## 2017-10-19 MED ORDER — LEVOTHYROXINE SODIUM 100 MCG IV SOLR: 37.5000 ug | Freq: Every day | INTRAVENOUS | Status: DC

## 2017-10-19 MED ORDER — MIDAZOLAM HCL 2 MG/2ML IJ SOLN
INTRAMUSCULAR | Status: AC | PRN
  Administered 2017-10-19: 0.5 mg via INTRAVENOUS

## 2017-10-19 MED ORDER — FAMOTIDINE 40 MG/5ML PO SUSR: 20.0000 mg | Freq: Two times a day (BID) | ORAL | Status: DC

## 2017-10-19 MED ORDER — PROPRANOLOL HCL 20 MG/5ML PO SOLN: 40.0000 mg | Freq: Three times a day (TID) | ORAL | 12 refills | Status: DC

## 2017-10-19 MED ORDER — VITAL AF 1.2 CAL PO LIQD: 1500.0000 mL | ORAL | Status: DC

## 2017-10-19 NOTE — PMR Pre-admission (Signed)
PMR Admission Coordinator Pre-Admission Assessment  Patient: Philip Thompson is an 27 y.o., male MRN: [id number] DOB: 1990/10/18 Height: 5' 7.99" (172.7 cm) Weight: 60.2 kg              Insurance Information HMO:     PPO: Yes     PCP:      IPA:      80/20:      OTHER: PRIMARY: [employer] [place]      Policy#: [id number]      Subscriber: Self CM Name: Hayes Ludwig      Phone#: [phone]     Fax#: [phone] Pre-Cert#: [id number] for 10/21/17-11/02/17 with request for faxed updates on 11/01/17     Employer: Not employed  Benefits:  Phone #: [phone]     Name: Verified online at [email or web address] Eff. Date: 07/18/17     Deduct: $600      Out of Pocket Max: $2400      Life Max: N/A CIR: 70%/30%      SNF: 70%/30% Outpatient: 30 combined visits     Co-Pay: $20 per visit  Home Health: 70%      Co-Pay: 30% DME: 70%     Co-Pay: 30% Providers: In-network   SECONDARY: None        Emergency Contact Information Contact Information    Name Relation Home Work Mobile   Corum,Cathy Mother [phone]  [phone]   Lauretta Grill   [phone]     Current Medical History  Patient Admitting Diagnosis: Debility  History of Present Illness: Philip Thompson is a 27 year old right-handed male with history of MDD, multiple TBIs, as well as anoxic BI, chronic pain,  SI, polysubstance abuse, and chronic constipation; who was admitted to [facility] on 09/21/2017 with 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  He was transferred to [facility] for treatment and CT scan done revealing bilateral PE as well as massive distention of colon.  Dr. August Saucer was consulted for input and recommended neostigmine trial with monitoring.  He was started on IV heparin for PE and and cardiology consulted due to reports of chest pain.  Pain felt to be noncardiac in nature and and maintained on anticoagulation initially with Xarelto but held due to drain site bleeding and an IVC filter was  placed 10/02/2017 per interventional radiology with recommendations to retrieve filter after 4 weeks.  Initial plans were to transition Lovenox to Eliquis.  As he had no improvement in colonic inertia, he was taken to the OR for subtotal colectomy and ileostomy on 09/27/2017 by Dr. Magnus Ivan.  He required intubation and sedation as well as NG tube due to high volume output as well as TPN for nutritional support.  He has had issues with anxiety, agitation and difficulty with vent wean and difficulty with vent wean requiring tracheostomy on 9/18 by Dr. Molli Knock and decannulated 10/20/2017.    Psychiatry consulted for input due to suicidal ideation with worsening of anxiety and poor sleep.  Initially maintained on Precedex for agitation weaned off 10/10/2017.  Multiple medication changes recommended and psych has signed off as "no evidence of imminent risk to self or others noted."  He continued to have high volume output from NG tube and gastrostomy tube was placed by Dr. Shella Spearing on 10/17/2017.  He was noted to have high output through G-tube to gravity therefore tube feeds resumed via cortak and gastric tube converted to G-J tube on 10/19/2017.  It has been discussed at  length that continued feeds are through J-tube and gravity bag maintained to G-tube.  Continues to be deconditioned requiring multiple rest breaks as well as tachycardia noted with activity.  CIR recommended due to debility and patient admitted 10/21/17.       Past Medical History  Past Medical History:  Diagnosis Date  . ADD (attention deficit disorder)   . Aggression   . Allergic rhinitis   . Anxiety   . Arthritis    joint pain  . Cardiomyopathy (HCC)    a. EF <25% in 2015 during admission for multi substance intoxication/respiratory failure requiring intubation.  . Chronic back pain   . Constipation   . Depression   . Fecal obstruction (HCC)   . Frontal head injury   . Heroin overdose (HCC)   . Obstipation   . Polysubstance  abuse (HCC)   . Suicide attempt (HCC)     Family History  family history includes Alcohol abuse in his father; Cancer in his paternal grandfather; Cancer - Lung in his paternal grandmother; Cancer - Ovarian in his maternal grandmother; Dementia in his father; Diabetes in his brother and other; Heart attack in his other; Heart disease in his other; Hypertension in his other.  Prior Rehab/Hospitalizations:  Has the patient had major surgery during 100 days prior to admission? No  Current Medications   Current Facility-Administered Medications:  .  acetaminophen (TYLENOL) solution 650 mg, 650 mg, Per Tube, Q6H PRN, Lonia Blood, MD, 650 mg at 10/18/17 1354 .  acetaminophen (TYLENOL) suppository 650 mg, 650 mg, Rectal, Q4H PRN, Jetty Duhamel T, MD .  alteplase (CATHFLO ACTIVASE) injection 2 mg, 2 mg, Intracatheter, Once, Pokhrel, Laxman, MD .  busPIRone (BUSPAR) tablet 10 mg, 10 mg, Per Tube, BID, Randel Pigg, Dorma Russell, MD, 10 mg at 10/21/17 [id number] .  chlorhexidine (PERIDEX) 0.12 % solution 15 mL, 15 mL, Mouth Rinse, BID, Lonia Blood, MD, 15 mL at 10/21/17 0951 .  Chlorhexidine Gluconate Cloth 2 % PADS 6 each, 6 each, Topical, Daily, Franky Macho, MD, 6 each at 10/20/17 1052 .  citalopram (CELEXA) 10 MG/5ML suspension 20 mg, 20 mg, Per Tube, Daily, Selmer Dominion B, NP, 20 mg at 10/21/17 0950 .  clonazePAM (KLONOPIN) tablet 1 mg, 1 mg, Oral, Q8H, Pokhrel, Laxman, MD, 1 mg at 10/21/17 0645 .  enoxaparin (LOVENOX) injection 60 mg, 60 mg, Subcutaneous, BID, Pokhrel, Laxman, MD, 60 mg at 10/21/17 0951 .  famotidine (PEPCID) 40 MG/5ML suspension 20 mg, 20 mg, Per Tube, BID, Titus Mould, RPH, 20 mg at 10/21/17 0950 .  feeding supplement (VITAL AF 1.2 CAL) liquid 1,500 mL, 1,500 mL, Per Tube, Continuous, Lonia Blood, MD, Last Rate: 80 mL/hr at 10/21/17 0651, 1,500 mL at 10/21/17 0651 .  fluticasone (FLONASE) 50 MCG/ACT nasal spray 2 spray, 2 spray, Each Nare, Daily, Lonia Blood, MD, 2 spray at 10/21/17 1008 .  gabapentin (NEURONTIN) 250 MG/5ML solution 300 mg, 300 mg, Per Tube, Q12H, Selmer Dominion B, NP, 300 mg at 10/21/17 0951 .  hydrOXYzine (ATARAX) 10 MG/5ML syrup 10 mg, 10 mg, Per Tube, TID PRN, Lonia Blood, MD, 10 mg at 10/20/17 1730 .  levalbuterol (XOPENEX) nebulizer solution 0.63 mg, 0.63 mg, Nebulization, Q3H PRN, Lonia Blood, MD, 0.63 mg at 10/18/17 0208 .  levothyroxine (SYNTHROID, LEVOTHROID) injection 37.5 mcg, 37.5 mcg, Intravenous, QAC breakfast, Desai, Rahul P, PA-C, 37.5 mcg at 10/21/17 0952 .  MEDLINE mouth rinse, 15 mL, Mouth Rinse, q12n4p, Lonia Blood,  MD, 15 mL at 10/20/17 1636 .  metoCLOPramide (REGLAN) injection 10 mg, 10 mg, Intravenous, Q6H, Violeta Gelinas, MD, 10 mg at 10/21/17 0645 .  neomycin-bacitracin-polymyxin (NEOSPORIN) ointment 1 application, 1 application, Topical, Daily, Gilmer Mor, DO, 1 application at 10/20/17 1052 .  nicotine (NICODERM CQ - dosed in mg/24 hours) patch 21 mg, 21 mg, Transdermal, Daily, Alyson Reedy, MD, 21 mg at 10/21/17 1022 .  [DISCONTINUED] ondansetron (ZOFRAN) tablet 4 mg, 4 mg, Oral, Q6H PRN **OR** ondansetron (ZOFRAN) injection 4 mg, 4 mg, Intravenous, Q6H PRN, Sherrie George, PA-C, 4 mg at 10/07/17 2112 .  oxyCODONE (ROXICODONE) 5 MG/5ML solution 10 mg, 10 mg, Per Tube, Q4H PRN, Pokhrel, Laxman, MD, 10 mg at 10/20/17 1635 .  potassium chloride 20 MEQ/15ML (10%) solution 40 mEq, 40 mEq, Per Tube, BID, Pokhrel, Laxman, MD, 40 mEq at 10/21/17 0952 .  propranolol (INDERAL) 20 MG/5ML solution 40 mg, 40 mg, Per Tube, TID, Lonia Blood, MD, 40 mg at 10/21/17 0951 .  QUEtiapine (SEROQUEL) tablet 200 mg, 200 mg, Per Tube, QHS, Alyson Reedy, MD, 200 mg at 10/20/17 2121 .  QUEtiapine (SEROQUEL) tablet 200 mg, 200 mg, Oral, QHS PRN, Randel Pigg, Dorma Russell, MD, 200 mg at 10/16/17 0030 .  sodium chloride flush (NS) 0.9 % injection 10-40 mL, 10-40 mL, Intracatheter, Q12H, Franky Macho, MD, 10 mL at 10/19/17 2227 .  sodium chloride flush (NS) 0.9 % injection 10-40 mL, 10-40 mL, Intracatheter, PRN, Franky Macho, MD, 10 mL at 10/20/17 0530 .  sodium chloride flush (NS) 0.9 % injection 3 mL, 3 mL, Intravenous, Q12H, Sherrie George, PA-C, 3 mL at 10/20/17 2300 .  traZODone (DESYREL) tablet 100 mg, 100 mg, Oral, QHS, Simpson, Paula B, NP, 100 mg at 10/20/17 2121 .  valproic acid (DEPAKENE) solution 500 mg, 500 mg, Per Tube, BID, Desai, Rahul P, PA-C, 500 mg at 10/21/17 0950  Patients Current Diet:  Diet Order            Diet NPO time specified Except for: Ice Chips  Diet effective now              Precautions / Restrictions Precautions Precautions: Fall Precaution Comments: Multiple lines (trach/PMV, ostomy, cortrak, NGT) Other Brace/Splint: NGT, coretrack, trach, PMV, ostomy Restrictions Weight Bearing Restrictions: No   Has the patient had 2 or more falls or a fall with injury in the past year?No  Prior Activity Level Household: Prior to admission patient mostly stayed home except for medical appointments.  He enjoys woodworking and listening to pod casts.  He loves animals and has a dog at home.  He lives with his dad who is disabled and mom is over daily to assist.   Journalist, newspaper / Equipment Home Assistive Devices/Equipment: None Home Equipment: None  Prior Device Use: Indicate devices/aids used by the patient prior to current illness, exacerbation or injury? 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  Independent with verbal supervision, reminders to do it  Indoor Mobility: Did the patient need assistance with walking from room to room (with or without device)? Independent  Stairs: Did the patient need assistance with internal or external stairs (with or  without device)? Independent  Functional Cognition: Did the patient need help planning regular tasks such as shopping or remembering to take medications? Dependent  Current  Functional Level Cognition  Overall Cognitive Status: Impaired/Different from baseline Current Attention Level: Selective Orientation Level: Oriented X4 Following Commands: Follows one step commands consistently Safety/Judgement: Decreased awareness of safety, Decreased awareness of deficits General Comments: discussed plan with pt, and left to get theraband.  Upon returning to room, pt asked "what are we doing now?" despite explanation provided earlier     Extremity Assessment (includes Sensation/Coordination)  Upper Extremity Assessment: Generalized weakness  Lower Extremity Assessment: Defer to PT evaluation    ADLs  Overall ADL's : Needs assistance/impaired Eating/Feeding: NPO Grooming: Wash/dry face, Moderate assistance, Sitting Grooming Details (indicate cue type and reason): assist primarily due to cortrak and NGT Upper Body Bathing: Set up, Bed level Lower Body Bathing: Minimal assistance, Sit to/from stand Upper Body Dressing : Minimal assistance, Sitting Upper Body Dressing Details (indicate cue type and reason): front opening gown Lower Body Dressing: Supervision/safety, Set up Lower Body Dressing Details (indicate cue type and reason): min guard A sit<>stand at EOB Toilet Transfer: Min guard, Ambulation, Comfort height toilet, Grab bars, RW Toilet Transfer Details (indicate cue type and reason): assist to safely maneuver RW in small spaces  Toileting- Clothing Manipulation and Hygiene: Moderate assistance, Sit to/from stand Functional mobility during ADLs: Minimal assistance, Min guard, Rolling walker General ADL Comments: RN assisting with lines.    Mobility  Overal bed mobility: Needs Assistance Bed Mobility: Supine to Sit, Sit to Supine Rolling: Max assist, +2 for physical assistance Supine to  sit: Supervision, HOB elevated Sit to supine: Supervision, HOB elevated General bed mobility comments: Pt sitting up in chair     Transfers  Overall transfer level: Needs assistance Equipment used: Rolling walker (2 wheeled) Transfers: Sit to/from Stand Sit to Stand: Min guard General transfer comment: VCs for safe hand placement    Ambulation / Gait / Stairs / Wheelchair Mobility  Ambulation/Gait Ambulation/Gait assistance: Editor, commissioning (Feet): 50 Feet Assistive device: Rolling walker (2 wheeled) Gait Pattern/deviations: Step-through pattern, Shuffle, Trunk flexed General Gait Details: Requesting to amb with RW secondary to fatigue. DOE 3/4; pt reports RPE 4/10 post-ambulation Gait velocity: Decreased Gait velocity interpretation: <1.8 ft/sec, indicate of risk for recurrent falls    Posture / Balance Balance Overall balance assessment: Needs assistance Sitting-balance support: No upper extremity supported, Feet supported Sitting balance-Leahy Scale: Good Standing balance support: No upper extremity supported, During functional activity, Single extremity supported Standing balance-Leahy Scale: Poor Standing balance comment: sit>stand from toilet with grab    Special needs/care consideration BiPAP/CPAP: No CPM: No Continuous Drip IV: No, but continuous J-tube feeds and G-tube to drain to gravity Dialysis: No         Life Vest: No Oxygen: No Special Bed: No Trach Size: Decannulated 10/20/17 and will required stoma care Wound Vac (area): No Skin: Surgical incision to abdomen to monitor; Moisture Associated Skin Damage to buttocks         Per WOC RN note from 10/1:  WOC Nurse ostomy follow up Demonstrated pouch change to patient.  He assisted with the procedure and asked appropriate questions. Stoma type/location: Ileostomy stoma is red and viable, flush with skin level, 1 1/4 inches, loose skin folds surrounding at 9:00 o'clock and 3:00 o'clock Peristomal assessment:  intact skin surrounding Output: Mod amt green liquid stool  Ostomy pouching: 1pc.  Education provided:  Applied one piece flexible convex pouch with barrier ring to attempt to maintain a seal.  Pt was able to open and close velcro to empty.  No family members  at the bedside, but pt states he plans to be independent with the process after discharge.  WOC team will continue teaching sessions twice a week.  Supplies ordered to the bedside for staff nurse use.  Reviewed pouching routines. Enrolled patient in [place] Secure Start Discharge program: No WOC Nurse wound consult note Reason for Consult: Pt previously had a stage 3 pressure injury to sacrum.  Upon assessment today, it is a dry healing scabbed area without depth or drainage Dressing procedure/placement/frequency: Foam dressing to protect from further injury.  No further topical treatment is indicated.                         Bowel mgmt: + colostomy output  Bladder mgmt: Continent  Diabetic mgmt: No     Previous Home Environment Living Arrangements: Parent Available Help at Discharge: Family, Available PRN/intermittently Type of Home: House Home Layout: One level Home Access: Stairs to enter Entrance Stairs-Rails: None Secretary/administrator of Steps: 2 Bathroom Shower/Tub: Engineer, manufacturing systems: Standard Home Care Services: No Additional Comments: [name] mother and pt confirmed above info  Discharge Living Setting Plans for Discharge Living Setting: Patient's home, Lives with (comment)(dad) Type of Home at Discharge: House Discharge Home Layout: One level Discharge Home Access: Level entry Discharge Bathroom Shower/Tub: Tub/shower unit, Curtain Discharge Bathroom Toilet: Standard Discharge Bathroom Accessibility: Yes How Accessible: Accessible via walker Does the patient have any problems obtaining your medications?: No  Social/Family/Support Systems Patient Roles: Other (Comment)(Son ) Contact Information:  Mom: Lynden Ang [phone] Anticipated Caregiver: Mom  Anticipated Caregiver's Contact Information: see above  Ability/Limitations of Caregiver: Mom works nights Fri, Sat, Sun, Mon Caregiver Availability: Intermittent Discharge Plan Discussed with Primary Caregiver: Yes(Cathy reports she can take FMLA upon discharge ) Is Caregiver In Agreement with Plan?: Yes Does Caregiver/Family have Issues with Lodging/Transportation while Pt is in Rehab?: No  Goals/Additional Needs Patient/Family Goal for Rehab: PT/OT/SLP: Supervision-Mod I Expected length of stay: 7-12 days  Cultural Considerations: None Dietary Needs: NPO  Equipment Needs: TBD Pt/Family Agrees to Admission and willing to participate: Yes Program Orientation Provided & Reviewed with Pt/Caregiver Including Roles  & Responsibilities: Yes Additional Information Needs: Education/teaching with patient and mom needs to be done for tubes and iluestomy  Information Needs to be Provided By: Team   Barriers to Discharge: Medical stability, Other (comments), Decreased caregiver support(education and teaching )  Decrease burden of Care through IP rehab admission: No  Possible need for SNF placement upon discharge: No  Patient Condition: This patient's medical and functional status has changed since the consult dated: 10/11/17 in which the Rehabilitation Physician determined and documented that the patient's condition is appropriate for intensive rehabilitative care in an inpatient rehabilitation facility. See "History of Present Illness" (above) for medical update. Functional changes are: Min A transfers and Min A gait with patient requesting use of rolling walker due to fatigue. Patient's medical and functional status update has been discussed with the Rehabilitation physician and patient remains appropriate for inpatient rehabilitation. Will admit to inpatient rehab today.  Preadmission Screen Completed By:  Fae Pippin, 10/21/2017 11:25  AM ______________________________________________________________________   Discussed status with Dr. Riley Kill on 10/21/17 at 1128 and received telephone approval for admission today.  Admission Coordinator:  Fae Pippin, time 1128/Date 10/21/17
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
## 2017-10-19 NOTE — Progress Notes (Signed)
Inpatient Rehabilitation  Met with patient and mom, Philip Thompson at bedside to further discuss IP Rehab.  [employer] requested that I notify them of IP Rehab authorization means that the discharge plan is home and SNF will not be covered.  Also discussed length of stay 7-12 days and anticipated need for supervision upon discharge.  Mom, Philip Thompson reports that [employer] has quoted coverage for providing home health at night when she works.  Explained the difference between skilled care and therapy home health versus supervision not being skilled or covered by insurance.  She is prepared to take some FMLA and assist her son in transitioning home also recommended that she initiate disability and Medicaid services for him.  She is in agreement with proceeding with admission today.     I have insurance approval for IP Rehab admission and a bed available to offer today.  I also have acute medical clearance and will process admission for today.  Call if questions.   Carmelia Roller., CCC/SLP Admission Coordinator  [place]  Cell [phone]

## 2017-10-19 NOTE — Progress Notes (Signed)
Inpatient Rehabilitation  Update: Reviewed case with medical team and given current use of IV pain medications.  I am unable to admit to IP Rehab today.  I notified MD, patient's mom, and bedside RN.  Hopefully patient can be transitioned off IV pain meds and my team will re-assess tomorrow.  Call if questions.   Charlane Ferretti., CCC/SLP Admission Coordinator  [facility] Inpatient Rehabilitation  Cell [phone]

## 2017-10-19 NOTE — Discharge Instructions (Signed)
[facility] [phone]  OPEN ABDOMINAL SURGERY: POST OP INSTRUCTIONS  Always review your discharge instruction sheet given to you by the facility where your surgery was performed.  IF YOU HAVE DISABILITY OR FAMILY LEAVE FORMS, YOU MUST BRING THEM TO THE OFFICE FOR PROCESSING.  PLEASE DO NOT GIVE THEM TO YOUR DOCTOR.  1. A prescription for pain medication may be given to you upon discharge.  Take your pain medication as prescribed, if needed.  If narcotic pain medicine is not needed, then you may take acetaminophen (Tylenol) or ibuprofen (Advil) as needed. 2. Take your usually prescribed medications unless otherwise directed. 3. If you need a refill on your pain medication, please contact your pharmacy. They will contact our office to request authorization.  Prescriptions will not be filled after 5pm or on week-ends. 4. You should follow a light diet the first few days after arrival home, such as soup and crackers, pudding, etc.unless your doctor has advised otherwise. A high-fiber, low fat diet can be resumed as tolerated.   Be sure to include lots of fluids daily. Most patients will experience some swelling and bruising on the chest and neck area.  Ice packs will help.  Swelling and bruising can take several days to resolve 5. Most patients will experience some swelling and bruising in the area of the incision. Ice pack will help. Swelling and bruising can take several days to resolve..  6. It is common to experience some constipation if taking pain medication after surgery.  Increasing fluid intake and taking a stool softener will usually help or prevent this problem from occurring.  A mild laxative (Milk of Magnesia or Miralax) should be taken according to package directions if there are no bowel movements after 48 hours. 7.  You may have steri-strips (small skin tapes) in place directly over the incision.  These strips should be left on the skin for 7-10 days.  If your  surgeon used skin glue on the incision, you may shower in 24 hours.  The glue will flake off over the next 2-3 weeks.  Any sutures or staples will be removed at the office during your follow-up visit. You may find that a light gauze bandage over your incision may keep your staples from being rubbed or pulled. You may shower and replace the bandage daily. 8. ACTIVITIES:  You may resume regular (light) daily activities beginning the next day--such as daily self-care, walking, climbing stairs--gradually increasing activities as tolerated.  You may have sexual intercourse when it is comfortable.  Refrain from any heavy lifting or straining until approved by your doctor. a. You may drive when you no longer are taking prescription pain medication, you can comfortably wear a seatbelt, and you can safely maneuver your car and apply brakes b. Return to Work: ___________________________________ 9. You should see your doctor in the office for a follow-up appointment approximately two weeks after your surgery.  Make sure that you call for this appointment within a day or two after you arrive home to insure a convenient appointment time. OTHER INSTRUCTIONS:  _____________________________________________________________ _____________________________________________________________  WHEN TO CALL YOUR DOCTOR: 1. Fever over 101.0 2. Inability to urinate 3. Nausea and/or vomiting 4. Extreme swelling or bruising 5. Continued bleeding from incision. 6. Increased pain, redness, or drainage from the incision. 7. Difficulty swallowing or breathing 8. Muscle cramping or spasms. 9. Numbness or tingling in hands or feet or around lips.  The clinic staff is available to   answer your questions during regular business hours.  Please don't hesitate to call and ask to speak to one of the nurses if you have concerns.  For further questions, please visit [email or web address]   

## 2017-10-19 NOTE — Procedures (Signed)
Pre procedural Dx: Dysphagia Post procedural Dx: Same  Successful fluoroscopic guided conversion of existing pull through G-tube to a GJ tube. Both lumes of the feeding tube are ready for immediate use.  EBL: None  Complications: None immediate.  Katherina Right, MD Pager #: [phone]

## 2017-10-19 NOTE — Sedation Documentation (Signed)
Patient has tracheostomy. Currently on room air. For procedure ETCO2 will unable to be monitered.

## 2017-10-19 NOTE — Progress Notes (Signed)
SLP Cancellation Note  Patient Details Name: Philip Thompson MRN: [id number] DOB: 1990/12/05   Cancelled treatment:       Reason Eval/Treat Not Completed: Other (comment) PMV tx not indicated as pt is doing capping trial today. Will f/u pending decannulation.    Maxcine Ham 10/19/2017, 8:25 AM  Maxcine Ham, M.A. CCC-SLP Acute Herbalist [phone] Office [phone]

## 2017-10-19 NOTE — Progress Notes (Addendum)
PCCM Interval Progress Note  [name] trach capped yesterday late morning 10/1.  Pt tolerated well all day and overnight with no issues; however, pt pulled cap off this AM.  Unsure how long has had cap off.  Pt scheduled for trip to IR today for J tube placement.  CAP replaced this AM.  Will leave trach as is for now incase has any problems post procedure.  Would continue capping overnight and then if remains stable by tomorrow AM, then proceed with decannulation.  PCCM will follow up tomorrow 10/3.  No charge.   Rutherford Guys, [place] [facility] Pager: [phone]  or [phone] 10/19/2017, 7:40 AM

## 2017-10-19 NOTE — Progress Notes (Signed)
Plans to convert G-tube to a GJ tube in IR.  Once this is done and they give clearance to use J tube, then Cortrak can be removed and TFs can commence down J portion of GJ tube.  This will likely be tomorrow at some point, usually 24 hrs after placement, but will defer to IR's recommendations.  Patient otherwise surgically stable.  Letha Cape 8:02 AM 10/19/2017

## 2017-10-19 NOTE — Progress Notes (Signed)
This RN called Philip Thompson with Rehab Admissions to check on the status of the patient transferring today. Melissa informed me that we are still waiting on insurance approval at this time.

## 2017-10-19 NOTE — Progress Notes (Addendum)
Patient trasfered from 80M to [phone] via wheelchair; alert and oriented x 4; no complaints of pain; triple lumen PICC line on RUA - NSL. Colostomy on RLQ; Shiley trach cuffed; PEG - dressing clean, dry, intact; no feeding running, no gravity bag attached to G-tube. Orient patient to room and unit; patient's parents at bedside. PC monitor placed per MD order. MD was paged and asked about the rate of feeding tube and the way he wants to titrate the feeding. Instructed how to use the call bell and fall risk precautions; aspiration precaution in place. Will continue to monitor the patient.

## 2017-10-19 NOTE — Progress Notes (Addendum)
[place] TEAM 1 - Stepdown/ICU TEAM  ZACKARIAH VANDERPOL  JYN:[id number] DOB: Oct 19, 1990 DOA: 09/21/2017 PCP: [name], MD    Brief Narrative:  27 year old male with a hx of polysubstance abuse, major depression, anxiety, chronic constipation, and hypothyroidism, presented to the ED w/ severe abdominal pain and distention with nausea and vomiting.  In the ED he was found to have colonic inertia/megacolon secondary to constipation, along with a pulmonary embolism.  Patient underwent total colectomy and ileostomy and received tracheostomy for failure to wean from ventilation.  Received IVC filter due to bleeding while on a drain.  Significant Events: 9/04 admit 9/10 total colectomy and ileostomy > massive distended colon from cecum all the way to the distal rectum and pelvic ring  9/10 intubation, unable to extubate 9/11 PICC line 9/15 placed retrievable IVC filter due to JP drain bleeding while on anti-coagulation 9/18 Shiley trach cuffed 9/22 started on Precedex for agitation, successfully weaned off on 9/23 9/23 JP drain out   Subjective: Complains of anxiety.  Denies any cough or shortness of breath chest pain.  Has been moving bowels in his colostomy.  No trouble urinating.  Ambulated some yesterday.  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  Planning for conversion of G-tube to GJ tube today postpyloric feeding.  General surgery on board.  Acute hypoxic respiratory failure status post tracheostomy converted to mucous plugging, pulmonary embolism and abdominal compartment syndrome. possible trach decannulation this week - doing well off vent.  Pulmonary embolism/DVT. Due to bowel obstruction/abdominal compartment syndrome. Patient was initially started on anticoagulation but was held on 9/14 drain site bleeding and a retrievable filter was placed on 9/15.  Currently on full dose Lovenox , IR recommending to retrieve filter  after 4 weeks (~10/15)  Persistent Sinus Tachycardia.  Complains of anxiety.  TSH was within normal limits.  Cortisol normal.  On beta-blockers we will continue with that.  Less likely due to withdrawal due to the timeframe.  Sepsis due to Staph aureus and E. Coli in the bronchoalveolar  lavage.  Patient has completed a course of antibiotics.  Anxiety/depression -does have history of substance abuse.  Patient still complains of anxiety.  On multiple psychiatric medication.  Seen by psychiatry this admission.  Hypothyroidism: continue Synthroid  Severe calorie malnutrition in context of chronic illness.  Plan for conversion of G-tube today.  Continue tube feeding.  DVT prophylaxis: lovenox   Code Status: FULL CODE  Family Communication: With the patient's mother at bedside.  Disposition Plan: Patient is awaiting for feeding tube conversion, rehabilitation.  Consultants:   General surgery  Interventional radiology  PCCM  GI   Psychiatry  Antimicrobials:  Cefazolin 9/22 > 9/30  Objective: Blood pressure 115/89, pulse (!) 114, temperature 98.2 F (36.8 C), temperature source Oral, resp. rate 17, height 5' 7.99" (1.727 m), weight 61.3 kg, SpO2 98 %.  Intake/Output Summary (Last 24 hours) at 10/19/2017 0941 Last data filed at 10/19/2017 0900 Gross per 24 hour  Intake 1990.21 ml  Output 5180 ml  Net -3189.79 ml   Filed Weights   10/17/17 0348 10/18/17 0425 10/19/17 0500  Weight: 61.7 kg 59.6 kg 61.3 kg    Examination: General: NAD - alert and conversant, feels anxious.  Trach collar in place. Lungs: CTA B w/o wheezing or crackles  Cardiovascular: Tachycardic.  S1-S2 heard.  No murmur. Abdomen: NT/ND, soft, BS+ - fully healed mildline incision ileostomy bag in place. Extremities: No edema B  LE  CNS: Alert awake and oriented.  Moves all extremities.  CBC: Recent Labs  Lab 10/17/17 0351 10/18/17 0500 10/19/17 0503  WBC 8.3 14.8* 12.9*  HGB 15.0 15.4 15.8  HCT  46.5 48.0 49.0  MCV 88.2 88.1 87.0  PLT 379 465* 437*   Basic Metabolic Panel: Recent Labs  Lab 10/13/17 0500  10/15/17 0434 10/17/17 0351 10/18/17 0500 10/19/17 0503  NA 134*   < > 138 134* 135 130*  K 5.1   < > 4.3 3.5 4.7 3.5  CL 99   < > 97* 89* 92* 81*  CO2 26   < > 31 35* 32 35*  GLUCOSE 240*   < > 107* 98 313* 114*  BUN 16   < > 19 28* 43* 45*  CREATININE 0.54*   < > 0.63 0.66 0.97 0.92  CALCIUM 9.2   < > 10.2 9.7 9.8 10.8*  MG 2.1  --  2.0 1.9 2.0  --   PHOS 4.4  --  4.8* 4.1  --   --    < > = values in this interval not displayed.   GFR: Estimated Creatinine Clearance: 104.6 mL/min (by C-G formula based on SCr of 0.92 mg/dL).  Liver Function Tests: Recent Labs  Lab 10/13/17 0500 10/15/17 0434 10/17/17 0351 10/19/17 0503  AST 26 24 21 23   ALT 46* 37 29 31  ALKPHOS 158* 187* 180* 162*  BILITOT 0.3 0.5 0.6 0.8  PROT 6.9 8.1 7.7 8.2*  ALBUMIN 2.5* 2.9* 3.1* 3.4*    HbA1C: Hgb A1c MFr Bld  Date/Time Value Ref Range Status  03/27/2017 06:54 AM 5.4 4.8 - 5.6 % Final    Comment:    (NOTE) Pre diabetes:          5.7%-6.4% Diabetes:              >6.4% Glycemic control for   <7.0% adults with diabetes   07/02/2016 07:34 AM 5.4 4.8 - 5.6 % Final    Comment:    (NOTE)         Pre-diabetes: 5.7 - 6.4         Diabetes: >6.4         Glycemic control for adults with diabetes: <7.0     CBG: Recent Labs  Lab 10/18/17 1552 10/18/17 1943 10/18/17 2318 10/19/17 0450 10/19/17 0723  GLUCAP 120* 110* 131* 116* 115*    Scheduled Meds: . busPIRone  10 mg Per Tube BID  . chlorhexidine  15 mL Mouth Rinse BID  . Chlorhexidine Gluconate Cloth  6 each Topical Daily  . citalopram  20 mg Per Tube Daily  . clonazepam  1 mg Oral Q8H  . famotidine  20 mg Per Tube BID  . fluticasone  2 spray Each Nare Daily  . free water  250 mL Per Tube Q6H  . gabapentin  300 mg Per Tube Q12H  . levothyroxine  37.5 mcg Intravenous QAC breakfast  . mouth rinse  15 mL Mouth Rinse  [id number]  . metoCLOPramide (REGLAN) injection  10 mg Intravenous Q6H  . neomycin-bacitracin-polymyxin  1 application Topical Daily  . nicotine  21 mg Transdermal Daily  . oxyCODONE  5 mg Per Tube Q6H  . propranolol  40 mg Per Tube TID  . QUEtiapine  200 mg Per Tube QHS  . sodium chloride flush  10-40 mL Intracatheter Q12H  . sodium chloride flush  3 mL Intravenous Q12H  . traZODone  100 mg Oral QHS  .  valproic acid  500 mg Per Tube BID   Continuous Infusions: . feeding supplement (VITAL AF 1.2 CAL) 1,500 mL (10/18/17 0830)     LOS: 27 days   Loistine Chance, MD  If 7PM-7AM, please contact night-coverage per Amion 10/19/2017, 9:41 AM

## 2017-10-20 DIAGNOSIS — Z9889 Other specified postprocedural states: Secondary | ICD-10-CM

## 2017-10-20 LAB — MAGNESIUM: MAGNESIUM: 1.7 mg/dL (ref 1.7–2.4)

## 2017-10-20 LAB — GLUCOSE, CAPILLARY
GLUCOSE-CAPILLARY: 124 mg/dL — AB (ref 70–99)
GLUCOSE-CAPILLARY: 118 mg/dL — AB (ref 70–99)
GLUCOSE-CAPILLARY: 125 mg/dL — AB (ref 70–99)
Glucose-Capillary: 100 mg/dL — ABNORMAL HIGH (ref 70–99)
Glucose-Capillary: 108 mg/dL — ABNORMAL HIGH (ref 70–99)
Glucose-Capillary: 108 mg/dL — ABNORMAL HIGH (ref 70–99)

## 2017-10-20 LAB — CBC
HEMATOCRIT: 46 % (ref 39.0–52.0)
HEMOGLOBIN: 15 g/dL (ref 13.0–17.0)
MCH: 28.3 pg (ref 26.0–34.0)
MCHC: 32.6 g/dL (ref 30.0–36.0)
MCV: 86.8 fL (ref 78.0–100.0)
Platelets: 294 10*3/uL (ref 150–400)
RBC: 5.3 MIL/uL (ref 4.22–5.81)
RDW: 13.1 % (ref 11.5–15.5)
WBC: 10.3 10*3/uL (ref 4.0–10.5)

## 2017-10-20 LAB — BASIC METABOLIC PANEL
Anion gap: 12 (ref 5–15)
BUN: 32 mg/dL — AB (ref 6–20)
CHLORIDE: 82 mmol/L — AB (ref 98–111)
CO2: 35 mmol/L — AB (ref 22–32)
CREATININE: 0.85 mg/dL (ref 0.61–1.24)
Calcium: 10.2 mg/dL (ref 8.9–10.3)
GFR calc Af Amer: >60 mL/min (ref >60)
GFR calc non Af Amer: >60 mL/min (ref >60)
GLUCOSE: 127 mg/dL — AB (ref 70–99)
Potassium: 3.1 mmol/L — ABNORMAL LOW (ref 3.5–5.1)
Sodium: 129 mmol/L — ABNORMAL LOW (ref 135–145)

## 2017-10-20 LAB — PHOSPHORUS
Phosphorus: 3.3 mg/dL (ref 2.5–4.6)
Phosphorus: 2.5 mg/dL (ref 2.5–4.6)

## 2017-10-20 MED ORDER — ALTEPLASE 2 MG IJ SOLR
2.0000 mg | Freq: Once | INTRAMUSCULAR | Status: DC
  Filled 2017-10-20: qty 2

## 2017-10-20 MED ORDER — CLONAZEPAM 1 MG PO TABS
1.0000 mg | ORAL_TABLET | Freq: Three times a day (TID) | ORAL | Status: DC
  Administered 2017-10-20: 1 mg via ORAL
  Filled 2017-10-20: qty 1

## 2017-10-20 MED ORDER — ENOXAPARIN SODIUM 60 MG/0.6ML ~~LOC~~ SOLN
60.0000 mg | SUBCUTANEOUS | Status: AC
  Administered 2017-10-20: 60 mg via SUBCUTANEOUS
  Filled 2017-10-20: qty 0.6

## 2017-10-20 MED ORDER — POTASSIUM CHLORIDE 20 MEQ/15ML (10%) PO SOLN: 40.0000 meq | Freq: Two times a day (BID) | ORAL | Status: DC

## 2017-10-20 MED ORDER — ENOXAPARIN SODIUM 60 MG/0.6ML ~~LOC~~ SOLN
60.0000 mg | Freq: Two times a day (BID) | SUBCUTANEOUS | Status: DC
  Administered 2017-10-20 – 2017-10-21 (×2): 60 mg via SUBCUTANEOUS
  Filled 2017-10-20 (×2): qty 0.6

## 2017-10-20 MED ORDER — POTASSIUM CHLORIDE 20 MEQ/15ML (10%) PO SOLN
40.0000 meq | Freq: Two times a day (BID) | ORAL | Status: DC
  Administered 2017-10-20 – 2017-10-21 (×2): 40 meq
  Filled 2017-10-20 (×2): qty 30

## 2017-10-20 MED ORDER — POTASSIUM CHLORIDE 20 MEQ PO PACK
40.0000 meq | PACK | Freq: Two times a day (BID) | ORAL | Status: DC
  Filled 2017-10-20: qty 2

## 2017-10-20 MED ORDER — CLONAZEPAM 0.125 MG PO TBDP
1.0000 mg | ORAL_TABLET | Freq: Three times a day (TID) | ORAL | Status: DC
  Administered 2017-10-20 (×2): 1 mg via ORAL
  Filled 2017-10-20 (×3): qty 8

## 2017-10-20 NOTE — Progress Notes (Addendum)
Nutrition Follow-up  DOCUMENTATION CODES:   Severe malnutrition in context of chronic illness  INTERVENTION:   - Continue Vital AF 1.2 @ 80 ml/hr (1920 ml/day) to provide 2304 kcal, 144 grams of protein, and 1557 ml free water  - d/c Vital High Protein tube feeding order  NUTRITION DIAGNOSIS:   Severe Malnutrition related to chronic illness (cardiomyopathy, substance abuse, chronic constipation/obstipation with megacolon) as evidenced by severe fat depletion, severe muscle depletion.  Ongoing, being addressed via TF  GOAL:   Patient will meet greater than or equal to 90% of their needs  Met with TF  MONITOR:   TF tolerance, Diet advancement, Labs, Weight trends, I & O's, Skin  ASSESSMENT:   27 yo male admitted 9/4 for megacolon and severe obstructive constipation with severely dilated colon and colitis. Noted pt admitted to [facility] at the end of August with constipation x 3 weeks with megacolon with medical therapy attempts, discharged on 9/2. Pt also with chest pain with bilateral PE on [place] includes polysubstance abuse, drug-induced cardiomyopathy, anxiety, chronic obstipation/constipation  9/10- total colectomy with ileostomy, vent 9/11 - PICC line inserted 9/12 - TPN initiated, extubated 9/13 - re-Intubated 9/16 - trickle feedings started 9/18 - trach placed, Cortrak  9/21 - TPN re-started, NG tube for decompression, TF held 9/23 - diagnostic Radiology advanced Cortrak tube to post-pyloric position, JP drain removed 9/26 -  abdominal xray confirms Cortrak tube in transverse duodenum, NG tube in stomach 9/27 - TPN discontinued, TF to goal rate 9/30 - IR placed G-tube 10/1 - capping trials started, tolerated well 10/2 - IR converted G-tube to G-J tube, Cortrak removed  Noted therapy recommendations for CIR. Per discussion with pt's mother, pt likely to d/c to CIR today.  Per Surgery note this AM, pt was being fed through G-tube and J-tube was clamped off  this morning. TF were stopped and transitioned to J-tube. Plan is to continue to feed through J-tube until G-tube output declines. G-tube will remain to gravity. Per Surgery, pt may have clear liquids for comfort if swallow is okay.  Discussed pt with RN. Vital High Protein was running at 40 ml/hr earlier today despite order for Vital AF 1.2 at 80 ml/hr which pt has had ordered and has had infusing since 9/29. During time of RD visit, formula was switched to Vital AF 1.2 and rate increased to 80 ml/hr as ordered.  Current tube feeding order: Vital AF 1.2 @ 80 ml/hr  [name] weight continues to trend down. Pt with 6 lb weight loss since last RD visit on 10/1. Severe malnutrition remains.  Medications reviewed and include: Pepcid 20 mg BID, levothyroxine 37.5 mcg daily, Reglan 10 mg q 6 hours  Labs reviewed: sodium 129 (L), potassium 3.1 (L), CO2 35 (H), BUN 32 (H) CBG's: 100, 124, 108, 132, 92 x 24 hours  UOP: 700 ml x 24 hours RLQ colostomy output: 885 ml x 24 hours I/O's: -22.1 L since admit  Diet Order:   Diet Order            Diet NPO time specified Except for: Ice Chips  Diet effective now              EDUCATION NEEDS:   Not appropriate for education at this time  Skin:  Skin Assessment: Skin Integrity Issues: Stage III: intragluteal (coccygela) improved; now dry, healing scabbed area without depth of draiange Incisions: abdomen  Last BM:  stool via ileostomy (885 ml x 24 hours)  Height:  Ht Readings from Last 1 Encounters:  09/27/17 5' 7.99" (1.727 m)    Weight:   Wt Readings from Last 1 Encounters:  10/20/17 59 kg    Ideal Body Weight:  70 kg  BMI:  Body mass index is 19.77 kg/m.  Estimated Nutritional Needs:   Kcal:  2200-2400 kcals   Protein:  138-152 g  Fluid:  >/= 2 L    Gaynell Face, MS, RD, LDN Inpatient Clinical Dietitian Pager: [phone] Weekend/After Hours: [phone]

## 2017-10-20 NOTE — Progress Notes (Signed)
Patient and family state that PA came into room & stated that the patient could be cannulated. RT has no orders at this time. Attending MD was paged for clarification.

## 2017-10-20 NOTE — Progress Notes (Signed)
[facility] TEAM 1 - Stepdown/ICU TEAM  Philip Thompson  AVW:[id number] DOB: Sep 02, 1990 DOA: 09/21/2017 PCP: [name], MD    Brief Narrative:  27 year old male with a hx of polysubstance abuse, major depression, anxiety, chronic constipation, and hypothyroidism, presented to the ED w/ severe abdominal pain and distention with nausea and vomiting.  In the ED he was found to have colonic inertia/megacolon secondary to constipation, along with a pulmonary embolism.  Patient underwent total colectomy and ileostomy and received tracheostomy for failure to wean from ventilation.  Received IVC filter due to bleeding while on a drain.  Significant Events: 9/04 admit 9/10 total colectomy and ileostomy > massive distended colon from cecum all the way to the distal rectum and pelvic ring  9/10 intubation, unable to extubate 9/11 PICC line 9/15 placed retrievable IVC filter due to JP drain bleeding while on anti-coagulation 9/18 Shiley trach cuffed 9/22 started on Precedex for agitation, successfully weaned off on 9/23 9/23 JP drain out  10/2- G-J tube placement and rehab assessment  Subjective: Denies any interval complaints today denies any overt pain nausea vomiting.  Is awaiting for discharge to rehab.  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  On J tube and tolerating tube feedings well.  Leukocytosis improved  Hypokalemia.  We will continue to replenish.  Check BMP in a.m.  Acute hypoxic respiratory failure status post tracheostomy converted to mucous plugging, pulmonary embolism and abdominal compartment syndrome.  Improved  Pulmonary embolism/DVT. Due to bowel obstruction/abdominal compartment syndrome. Patient was initially started on anticoagulation but was held on 9/14 drain site bleeding and a retrievable filter was placed on 9/15.  Currently on full dose Lovenox , IR recommending to retrieve filter after 4 weeks  (~10/15).  Change Lovenox to Eliquis on discharge   Sinus Tachycardia.  TSH was within normal limits.  Cortisol normal.  On beta-blockers we will continue with that.  Less likely due to withdrawal due to the timeframe.  Sepsis due to Staph aureus and E. Coli in the bronchoalveolar  lavage.  Patient has completed a course of antibiotics.  Anxiety/depression -does have history of substance abuse.  Patient still complains of anxiety.  On multiple psychiatric medications.  Seen by psychiatry this admission.  Hypothyroidism: continue Synthroid  Severe calorie malnutrition in context of chronic illness.  Continue nutrition through G-tube   DVT prophylaxis: lovenox   Code Status: FULL CODE  Family Communication: No family members present at bedside  Disposition Plan: Awaiting for rehabilitation placement.  Consultants:   General surgery  Interventional radiology  PCCM  GI   Psychiatry  Antimicrobials:  Cefazolin 9/22 > 9/30  Objective: Blood pressure 109/73, pulse 96, temperature 98.6 F (37 C), temperature source Axillary, resp. rate 18, height 5' 7.99" (1.727 m), weight 59 kg, SpO2 97 %.  Intake/Output Summary (Last 24 hours) at 10/20/2017 1307 Last data filed at 10/20/2017 1109 Gross per 24 hour  Intake 396 ml  Output 1675 ml  Net -1279 ml   Filed Weights   10/18/17 0425 10/19/17 0500 10/20/17 0537  Weight: 59.6 kg 61.3 kg 59 kg    Examination: General exam: Appears calm and comfortable ,Not in distress, thinly built.  Trach collar in place. HEENT:PERRL,Oral mucosa moist, trach collar Respiratory system: Bilateral equal air entry, normal vesicular breath sounds, no wheezes or crackles  Cardiovascular system: S1 & S2 heard, RRR.  Mildly tachycardic.  No JVD, murmurs, rubs, gallops or clicks. No pedal edema.  Gastrointestinal system: Gastrojejunostomy tube in place, fully healed midline incision.  Ileostomy bag in place Central nervous system: Alert and oriented. No  focal neurological deficits. Extremities: No edema, no clubbing ,no cyanosis, distal peripheral pulses palpable. Skin: No rashes, lesions or ulcers,no icterus ,no pallor MSK: Normal muscle bulk,tone ,power  CBC: Recent Labs  Lab 10/18/17 0500 10/19/17 0503 10/20/17 0520  WBC 14.8* 12.9* 10.3  HGB 15.4 15.8 15.0  HCT 48.0 49.0 46.0  MCV 88.1 87.0 86.8  PLT 465* 437* 294   Basic Metabolic Panel: Recent Labs  Lab 10/17/17 0351 10/18/17 0500 10/19/17 0503 10/19/17 2018 10/20/17 0520  NA 134* 135 130*  --  129*  K 3.5 4.7 3.5  --  3.1*  CL 89* 92* 81*  --  82*  CO2 35* 32 35*  --  35*  GLUCOSE 98 313* 114*  --  127*  BUN 28* 43* 45*  --  32*  CREATININE 0.66 0.97 0.92  --  0.85  CALCIUM 9.7 9.8 10.8*  --  10.2  MG 1.9 2.0  --  2.1  --   PHOS 4.1  --   --  3.0 3.3   GFR: Estimated Creatinine Clearance: 108.9 mL/min (by C-G formula based on SCr of 0.85 mg/dL).  Liver Function Tests: Recent Labs  Lab 10/15/17 0434 10/17/17 0351 10/19/17 0503  AST 24 21 23   ALT 37 29 31  ALKPHOS 187* 180* 162*  BILITOT 0.5 0.6 0.8  PROT 8.1 7.7 8.2*  ALBUMIN 2.9* 3.1* 3.4*    HbA1C: Hgb A1c MFr Bld  Date/Time Value Ref Range Status  03/27/2017 06:54 AM 5.4 4.8 - 5.6 % Final    Comment:    (NOTE) Pre diabetes:          5.7%-6.4% Diabetes:              >6.4% Glycemic control for   <7.0% adults with diabetes   07/02/2016 07:34 AM 5.4 4.8 - 5.6 % Final    Comment:    (NOTE)         Pre-diabetes: 5.7 - 6.4         Diabetes: >6.4         Glycemic control for adults with diabetes: <7.0     CBG: Recent Labs  Lab 10/19/17 1936 10/20/17 0010 10/20/17 0546 10/20/17 0735 10/20/17 1206  GLUCAP 132* 108* 124* 100* 108*    Scheduled Meds: . busPIRone  10 mg Per Tube BID  . chlorhexidine  15 mL Mouth Rinse BID  . Chlorhexidine Gluconate Cloth  6 each Topical Daily  . citalopram  20 mg Per Tube Daily  . clonazePAM  1 mg Oral Q8H  . enoxaparin (LOVENOX) injection  60 mg  Subcutaneous NOW  . [START ON 10/21/2017] enoxaparin (LOVENOX) injection  60 mg Subcutaneous BID  . famotidine  20 mg Per Tube BID  . fluticasone  2 spray Each Nare Daily  . gabapentin  300 mg Per Tube Q12H  . levothyroxine  37.5 mcg Intravenous QAC breakfast  . mouth rinse  15 mL Mouth Rinse q12n4p  . metoCLOPramide (REGLAN) injection  10 mg Intravenous Q6H  . neomycin-bacitracin-polymyxin  1 application Topical Daily  . nicotine  21 mg Transdermal Daily  . propranolol  40 mg Per Tube TID  . QUEtiapine  200 mg Per Tube QHS  . sodium chloride flush  10-40 mL Intracatheter Q12H  . sodium chloride flush  3 mL Intravenous Q12H  . traZODone  100  mg Oral QHS  . valproic acid  500 mg Per Tube BID   Continuous Infusions: . feeding supplement (VITAL AF 1.2 CAL) 1,500 mL (10/20/17 1034)     LOS: 28 days   Loistine Chance, MD  If 7PM-7AM, please contact night-coverage per Amion 10/20/2017, 1:07 PM

## 2017-10-20 NOTE — Progress Notes (Signed)
Spoke with Tristan Schroeder RN re PICC line.  Tristan Schroeder RN to notify MD for tPa order for 2 ports.  Once tPa arrives, IT sales professional to place IVT consult to instill tPa.

## 2017-10-20 NOTE — Progress Notes (Signed)
Occupational Therapy Treatment Patient Details Name: Philip Thompson MRN: [id number] DOB: April 11, 1990 Today's Date: 10/20/2017    History of present illness Pt is a 27 y.o. 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 Had acute aspiration pneumonia on 9/10 prior to surgery in the setting of nausea/vomiting. S/p total colectomy and ileostomy on 9/10. Extub 9/12; reintubated 9/13. Trach and cortrak placed 9/18. PMH includes anxiety, depression, substance abuse disorder.    OT comments  Philip Thompson is progressing nicely towards a S level or better. He is very motivated to work with therapy. His HR still increases with minimal activity (at rest 106, sitting up to EOB 116, up to bathroom 132)--sats remained 97% or better on RA. He continues to benefit from acute OT with follow up on CIR.  Follow Up Recommendations  CIR;Supervision/Assistance - 24 hour    Equipment Recommendations  Other (comment)(TBD next venue)       Precautions / Restrictions Precautions Precautions: Fall Precaution Comments: Multiple lines (trach/PMV, ostomy, cortrak, NGT) Restrictions Weight Bearing Restrictions: No       Mobility Bed Mobility Overal bed mobility: Needs Assistance Bed Mobility: Supine to Sit;Sit to Supine     Supine to sit: Supervision;HOB elevated Sit to supine: Supervision;HOB elevated      Transfers Overall transfer level: Needs assistance Equipment used: Rolling walker (2 wheeled) Transfers: Sit to/from Stand Sit to Stand: Min guard         General transfer comment: VCs for safe hand placement    Balance Overall balance assessment: Needs assistance Sitting-balance support: No upper extremity supported;Feet supported Sitting balance-Leahy Scale: Good     Standing balance support: No upper extremity supported;During functional activity;Single extremity supported Standing balance-Leahy Scale:  Poor Standing balance comment: sit>stand from toilet with grab                           ADL either performed or assessed with clinical judgement   ADL Overall ADL's : Needs assistance/impaired                     Lower Body Dressing: Supervision/safety;Set up Lower Body Dressing Details (indicate cue type and reason): min guard A sit<>stand at EOB Toilet Transfer: Min guard;Ambulation;Comfort height toilet;Grab bars;RW                   Vision Patient Visual Report: No change from baseline            Cognition Arousal/Alertness: Awake/alert Behavior During Therapy: Flat affect Overall Cognitive Status: Impaired/Different from baseline Area of Impairment: Following commands;Safety/judgement;Problem solving                   Current Attention Level: Selective Memory: Decreased short-term memory Following Commands: Follows one step commands consistently Safety/Judgement: Decreased awareness of safety;Decreased awareness of deficits Awareness: Emergent Problem Solving: Slow processing;Requires verbal cues          Exercises Other Exercises Other Exercises: Had pt work on partial stand to sit at EOB and then working on getting up and down from toilet with UE support      General Comments Mother present and very supportive; both remain motivated for CIR    Pertinent Vitals/ Pain       Pain Assessment: Faces Faces Pain Scale: Hurts a little bit Pain Location: upper back (chronic--"hurts sometimes more than other") Pain Descriptors / Indicators: Aching;Sore Pain Intervention(s): Limited  activity within patient's tolerance;Monitored during session(offered heat, but pt stated that usually does not help)         Frequency  Min 2X/week        Progress Toward Goals  OT Goals(current goals can now be found in the care plan section)  Progress towards OT goals: Progressing toward goals  Acute Rehab OT Goals Patient Stated Goal: CIR  today  Plan Discharge plan remains appropriate       AM-PAC PT "6 Clicks" Daily Activity     Outcome Measure   Help from another person eating meals?: None(can do his own ice chips) Help from another person taking care of personal grooming?: A Little Help from another person toileting, which includes using toliet, bedpan, or urinal?: A Little Help from another person bathing (including washing, rinsing, drying)?: A Little Help from another person to put on and taking off regular upper body clothing?: A Little Help from another person to put on and taking off regular lower body clothing?: A Little 6 Click Score: 19    End of Session Equipment Utilized During Treatment: Rolling walker  OT Visit Diagnosis: Other abnormalities of gait and mobility (R26.89);Pain;Muscle weakness (generalized) (M62.81);Unsteadiness on feet (R26.81);Other symptoms and signs involving cognitive function Pain - part of body: (upper back)   Activity Tolerance Patient tolerated treatment well   Patient Left in bed;with call bell/phone within reach;with family/visitor present   Nurse Communication (tube feeds alarming--saying finished)        Time: 1779-3903 OT Time Calculation (min): 22 min  Charges: OT General Charges $OT Visit: 1 Visit OT Treatments $Self Care/Home Management : 8-22 mins  Philip Thompson, OTR/L Acute [employer] Pager [phone] Office [phone]

## 2017-10-20 NOTE — Progress Notes (Signed)
Philip Thompson  YQI:[id number] DOB: [birth year] DOA: 09/21/2017 PCP: [name], MD    LOS: 28 days    Reason for Consult / Chief Complaint:  Pre op acute hypoxemic respiratory failure   Consulting MD and date:  [name]  HPI/Summary of hospital stay:  27 y/o male admitted with colonic inertia/megacolon secondary to constipation and bilateral pulmonary emboli and DVT due to pelvic vein obstruction.  He had acute aspiration pneumonia on 9/10 prior to surgery in the setting of nausea/vomiting.  He went to the OR on 9/10 for total colectomy and ileostomy.    Ventilator weaning has been hampered by generalized weakness, periods of agitation and the patient / family's requests for sedation.  Initially started on TPN for nutrition now transitioning to enteral nutrition.  Found to have DVT. Filter placed initially given bleeding from JP drain. IV heparin eventually started and now transitioned to Lovenox. Filter remains in place.   9/22 - started on precedex for agitation, spitting at staff/kicking, attempting to bite 9/23 - more oriented, klonopin increased / did not tolerate  9/27 tolerated ATC for 24 hours.  9/30 Off vent since 9/24  10/1 started capping tirals 10/3 done well with cap. decannulate today.   Subjective:  Tolerating cap well. Denies SOB.   Objective   Blood pressure 109/73, pulse 98, temperature 98.6 F (37 C), temperature source Axillary, resp. rate 18, height 5' 7.99" (1.727 m), weight 59 kg, SpO2 98 %.        Intake/Output Summary (Last 24 hours) at 10/20/2017 1556 Last data filed at 10/20/2017 1109 Gross per 24 hour  Intake 396 ml  Output 1650 ml  Net -1254 ml   Filed Weights   10/18/17 0425 10/19/17 0500 10/20/17 0537  Weight: 59.6 kg 61.3 kg 59 kg    Examination:  General: frail young male in NAD. HEENT: Trach with red cap in place, on room air.  Neuro: alert, oriented, non-focal CV: RRR, tachy 120s during PT PULM: even/non-labored. Trach/red  cap in place. Phonating and moving good air around trach.  GI: Longitudal incision CDI, ostomy pink, healthy.  Extremities: warm/dry, neg  edema  Skin: no rashes or lesions   Consults: date of consult/date signed off & final recs:  General surgery 9/6 GI 9/6 Critical care 9/10   Procedures: 9/10 - Total colectomy and ileostomy.  Was found to have a massively distended colon from the cecum all the way to distal rectum and pelvic rim.  No evidence of perforation. Oral endotracheal tube 9/10 >> 9/12.  9/13 >  PICC 9/11>>  ETT 9/10 >> 9/12;  9/13 >> 9/19 Retrievable IVC filter 9/15 6 shiley trach cuffed 9/18 >>  Assessment & Plan:   Acute respiratory failure with hypoxemia due to nutrition related weakness and mucus plugging compounded by sedation use. P: Tolerating CAP for 2 days. Will plan to decannulate.   Provoked DVT/PE from bowel obstruction/abdominal compartment syndrome: anticoagulation held d/t JP bled on 9/14. no evidence of RV strain on CT. S/p retrievable filter 9/15 P: He has a IVC filter in place Retrieval within 4 weeks in place  Ogilvie's syndrome s/p colectomy and ileostomy -JP drain out, staples out 9/23 P:  General surgery is following  HCAP + BAL culture for Staph Aureus and E coli. 9/22. RLL collapse/atelectasis P: Per primary   Disposition / Summary of Today's Plan 10/20/17   Tolerating cap very well. Ready for decannulation.     Feeding: TPN / TF  Analgesia: enteral oxy Sedation: RASS goal 0   Thromboprophylaxis: IVC filter, Lovenox. HOB >30 degrees Ulcer prophylaxis: famotidine Glucose control: CBG q 4, SSI Family: 9/30 mother updated bedside  Labs and ancillary test   Recent Labs  Lab 10/15/17 0434 10/17/17 0351 10/18/17 0500 10/19/17 0503 10/19/17 2018 10/20/17 0520  NA 138 134* 135 130*  --  129*  K 4.3 3.5 4.7 3.5  --  3.1*  CL 97* 89* 92* 81*  --  82*  CO2 31 35* 32 35*  --  35*  GLUCOSE 107* 98 313* 114*  --  127*  BUN  19 28* 43* 45*  --  32*  CREATININE 0.63 0.66 0.97 0.92  --  0.85  CALCIUM 10.2 9.7 9.8 10.8*  --  10.2  MG 2.0 1.9 2.0  --  2.1  --   PHOS 4.8* 4.1  --   --  3.0 3.3    Recent Labs  Lab 10/18/17 0500 10/19/17 0503 10/20/17 0520  HGB 15.4 15.8 15.0  HCT 48.0 49.0 46.0  WBC 14.8* 12.9* 10.3  PLT 465* 437* [place], AGACNP-BC [facility] Pager [phone] or [phone]  10/20/2017 3:56 PM

## 2017-10-20 NOTE — Progress Notes (Signed)
Per MD order, gravity bag was attached to G-tube. Will continue to monitor.

## 2017-10-20 NOTE — Progress Notes (Signed)
Patient ID: Philip Thompson, male   DOB: Oct 08, 1990, 27 y.o.   MRN: [id number]    23 Days Post-Op  Subjective: Patient sleepy this morning.  No other complaints.  Objective: Vital signs in last 24 hours: Temp:  [97.6 F (36.4 C)-98.5 F (36.9 C)] 98 F (36.7 C) (10/03 0537) Pulse Rate:  [85-122] 93 (10/03 0410) Resp:  [9-20] 17 (10/03 0410) BP: (98-126)/(72-99) 110/82 (10/03 0537) SpO2:  [93 %-100 %] 93 % (10/03 0537) Weight:  [59 kg] 59 kg (10/03 0537) Last BM Date: 10/18/17  Intake/Output from previous day: 10/02 0701 - 10/03 0700 In: 496.2 [I.V.:30; NG/GT:366; IV Piggyback:100.2] Out: 1585 [Urine:700; Stool:885] Intake/Output this shift: No intake/output data recorded.  PE: Abd: g-tube is currently being fed and J-tube clamped off.  Unclear why this is the case.  TFs stopped and RN called to patient's room.  abd otherwise NT, ND, incision is healing well.  Lab Results:  Recent Labs    10/19/17 0503 10/20/17 0520  WBC 12.9* 10.3  HGB 15.8 15.0  HCT 49.0 46.0  PLT 437* 294   BMET Recent Labs    10/19/17 0503 10/20/17 0520  NA 130* 129*  K 3.5 3.1*  CL 81* 82*  CO2 35* 35*  GLUCOSE 114* 127*  BUN 45* 32*  CREATININE 0.92 0.85  CALCIUM 10.8* 10.2   PT/INR No results for input(s): LABPROT, INR in the last 72 hours. CMP     Component Value Date/Time   NA 129 (L) 10/20/2017 0520   K 3.1 (L) 10/20/2017 0520   CL 82 (L) 10/20/2017 0520   CO2 35 (H) 10/20/2017 0520   GLUCOSE 127 (H) 10/20/2017 0520   BUN 32 (H) 10/20/2017 0520   CREATININE 0.85 10/20/2017 0520   CALCIUM 10.2 10/20/2017 0520   PROT 8.2 (H) 10/19/2017 0503   ALBUMIN 3.4 (L) 10/19/2017 0503   AST 23 10/19/2017 0503   ALT 31 10/19/2017 0503   ALKPHOS 162 (H) 10/19/2017 0503   BILITOT 0.8 10/19/2017 0503   GFRNONAA >60 10/20/2017 0520   GFRAA >60 10/20/2017 0520   Lipase     Component Value Date/Time   LIPASE 37 09/21/2017 2237       Studies/Results: Ir Adele Schilder Tamsen Snider Convert  Gastr-jej Per W/fl Mod Sed  Result Date: 10/19/2017 INDICATION: History of gastric atony, post percutaneous gastrostomy tube placement on 10/17/2017 Request now made for conversion of the existing pull-through gastrostomy to a gastrojejunostomy catheter for post pyloric nutrition supplementation. EXAM: FLUOROSCOPIC GUIDED CONVERSION OF GASTROSTOMY TO A GASTROJEJUNOSTOMY TUBE COMPARISON:  Image guided gastrostomy tube placement - 10/17/2017 MEDICATIONS: None ANESTHESIA/SEDATION: Moderate (conscious) sedation was employed during this procedure. A total of Versed 0.5 mg and Fentanyl 50 mcg was administered intravenously. Moderate Sedation Time: 13 minutes. The patient's level of consciousness and vital signs were monitored continuously by radiology nursing throughout the procedure under my direct supervision. CONTRAST:  20 cc Isovue-300, administered into the gastric lumen and proximal small bowel FLUOROSCOPY TIME:  3 minutes, 12 seconds (15 mGy) COMPLICATIONS: None. PROCEDURE: Informed written consent was obtained from the patient after a discussion of the risks and benefits. The upper abdomen and the external portion of the existing gastrostomy tube was prepped and draped in the usual sterile fashion, and a sterile drape was applied covering the operative field. Maximum barrier sterile technique with sterile gowns and gloves were used for the procedure. A timeout was performed prior to the initiation of the procedure. The skin surrounding the existing pull-through gastrostomy  tube was anesthetized with 1% lidocaine with epinephrine. Small amount of contrast was injected via the existing gastrostomy tube. The external portion of the pull-through gastrostomy tube with trimmed and a gastrostomy tube was cannulated with a Kumpe the catheter. With the use of a stiff Glidewire, the Kumpe catheter was advanced through the stomach into the proximal small bowel. Contrast injection confirmed appropriate positioning. Next,  under intermittent fluoroscopic guidance, the existing Kumpe the catheter was exchanged for a new coaxial 9-French gastrojejunostomy catheter which was advanced through the pull-through gastrostomy tube with tip ultimately terminating within the proximal small bowel. Contrast injection via the jejunostomy and gastric lumens confirmed appropriate functioning and positioning. Next, under intermittent fluoroscopic guidance, the existing nasogastric tube was removed without displacement of the existing gastrojejunostomy catheter. A dressing was placed. The patient tolerated procedure well without immediate postprocedural complication. IMPRESSION: Successful fluoroscopic guided conversion of existing pull-through gastrostomy tube to a gastrojejunostomy catheter. The tip of the gastrostomy port lies within the gastric lumen and the tip of the jejunostomy lumen lies within the proximal jejunum. Both lumens ready for immediate use. Electronically Signed   By: Simonne Come M.D.   On: 10/19/2017 13:38    Anti-infectives: Anti-infectives (From admission, onward)   Start     Dose/Rate Route Frequency Ordered Stop   10/19/17 0800  ceFAZolin (ANCEF) IVPB 2g/100 mL premix     2 g 200 mL/hr over 30 Minutes Intravenous To Radiology 10/18/17 1052 10/19/17 0926   10/17/17 1700  ceFAZolin (ANCEF) IVPB 2g/100 mL premix     2 g 200 mL/hr over 30 Minutes Intravenous  Once 10/17/17 1608 10/17/17 1545   10/17/17 1615  ceFAZolin (ANCEF) IVPB 2g/100 mL premix  Status:  Discontinued     2 g 200 mL/hr over 30 Minutes Intravenous Every 8 hours 10/17/17 1606 10/17/17 1607   10/17/17 1510  ceFAZolin (ANCEF) 2-4 GM/100ML-% IVPB    Note to Pharmacy:  [name]   : cabinet override      10/17/17 1510 10/18/17 0314   10/09/17 1730  ceFAZolin (ANCEF) IVPB 2g/100 mL premix     2 g 200 mL/hr over 30 Minutes Intravenous Every 8 hours 10/09/17 1641 10/16/17 1745   10/08/17 0815  erythromycin ethylsuccinate (EES) 200 MG/5ML  suspension 200 mg  Status:  Discontinued     200 mg Per Tube Every 6 hours 10/08/17 0807 10/09/17 1636   09/27/17 1000  piperacillin-tazobactam (ZOSYN) IVPB 3.375 g  Status:  Discontinued     3.375 g 12.5 mL/hr over 240 Minutes Intravenous Every 8 hours 09/27/17 0845 10/06/17 1504       Assessment/Plan S/P subtotal colectomy with ileostomy 9/10 Dr. Magnus Ivan-  -Gastric atony, g-tube to gravity. -s/p G to GJ conversion yesterday.  Unfortunately when I walked in this morning, the patient's g-tube was being fed and his j-tube was clamped off.  This puts the patient at high risk for aspiration.  I have contacted the nurse to address this with her to get a gravity bag and place his g-tube to a gravity bag.  We discussed that as of now, the jtube is the only port that should be fed.  We discussed flushing regimen as well for both ports.  I will also have her put a sign above his bed that says "No medications through jtube" -cont to feed jtube for the forseeable future until his g-tube output declines and it is evidence that his gastric atony is improving. -no further surgical interventions warranted at this  time.  He is stable surgically for DC to rehab -if his swallow is ok, he could have clear liquids for comfort as long as g-tube remains to gravity PE- Lovenox Acute resp failure- per CCM Tachycardia/SVT- per medicine PT/OT- rec CIR  FEN - ice/g-tube for gravity/J tube for TFs VTE -Lovenox ID -none currently   LOS: 28 days    Letha Cape , [facility] Surgery 10/20/2017, 7:43 AM Pager: [phone]

## 2017-10-20 NOTE — Progress Notes (Signed)
Patient and his mother were educated that during feeding per J-tube, patient must have is upper part of body elevated at 35 degrees. I explained to them that lying flat or lower than 35 degrees will put him on aspiration risk. Will continue to monitor.

## 2017-10-20 NOTE — Progress Notes (Signed)
Patient was de cannulated without any complications. Dressing applied to stoma site. All vitals are within normal limits at this time & patient is not in any distress.

## 2017-10-20 NOTE — Progress Notes (Signed)
Inpatient Rehabilitation  Reviewed chart, discussed plan of care with bedside RN, called Surgery PA for clarification, and reviewed case with Rehab MD.  Patient not medically ready today per Dr. Wynn Banker given need to demonstrate ability to tolerate tube feeds as well as to get g-tube draining to gravity.  Also note, electrolyte imbalance and that patient is tachy today.  Plan to follow up again tomorrow in hopes of medical readiness.  Updated patient and mom at bedside.  Charlane Ferretti., CCC/SLP Admission Coordinator  [facility]  Cell [phone]

## 2017-10-20 NOTE — Consult Note (Signed)
WOC Nurse ostomy follow up Stoma type/location: Gastrostomy tube needs to be open to drain.  Has GJ tube with nutrition going into J tube.  G tube is connected to bedside drainage.  There was no immediate drainage.    J tube is infusing feeding at this time.  Colostomy is intact.   WOC team will continue to follow.  Maple Hudson MSN, RN, FNP-BC CWON Wound, Ostomy, Continence Nurse Pager [phone]

## 2017-10-20 NOTE — Progress Notes (Signed)
PHARMACY ANTICOAGULATION & ANTIBIOTIC CONSULT NOTE   Pharmacy Consult for Lovenox Indication: pulmonary embolus  No Known Allergies  Patient Measurements: Height: 5' 7.99" (172.7 cm) Weight: 130 lb (59 kg) IBW/kg (Calculated) : 68.38   Vital Signs: Temp: 98.6 F (37 C) (10/03 1218) Temp Source: Axillary (10/03 1218) BP: 109/73 (10/03 1218) Pulse Rate: 96 (10/03 1218)  Labs: Recent Labs    10/18/17 0500 10/19/17 0503 10/20/17 0520  HGB 15.4 15.8 15.0  HCT 48.0 49.0 46.0  PLT 465* 437* 294  CREATININE 0.97 0.92 0.85    Estimated Creatinine Clearance: 108.9 mL/min (by C-G formula based on SCr of 0.85 mg/dL).   Assessment: [name] admitted 9/4 for megacolon and severe obstructive constipation with severely dilated colon and colitis s/p colectomy 9/10 and found to have bilateral pulmonary emboli and DVT (presumed to be related to GI issue impeding venous return). Initially treated with heparin infusion but stopped d/t continued bleeding from JP drain requiring PRBC and FFP 9/12 and another 2 units PRBC 9/14. Subsequently, IVC Filter placed 9/15. Pharmacy consulted 9/21 to switch to Lovenox.   Lovenox was held yesterday for jejunostomy tube. D/w with CCS today and ok to resume today.   Goal of Therapy:  Monitor platelets by anticoagulation protocol: Yes   Plan:  Restart enoxaparin to 60 mg [place] q12h CBC every 72 hours Monitor CBC, renal fx, and clinical progress    Ulyses Southward, PharmD, Suzan Nailer, AAHIVP, CPP Infectious Disease Pharmacist Pager: [phone] 10/20/2017 12:57 PM

## 2017-10-20 NOTE — Progress Notes (Signed)
Spoke with Dr Pollie Meyer re PICC line.  New verbal order to attempt PIV, then d/c PICC line.  If unable to obtain PIV, then instill tPa in PICC line. Pt and family agreeable.

## 2017-10-20 NOTE — Progress Notes (Signed)
Physical Therapy Treatment Patient Details Name: Philip Thompson MRN: [id number] DOB: [birth year] Today's Date: 10/20/2017    History of Present Illness Pt is a 27 y.o. 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 Had acute aspiration pneumonia on 9/10 prior to surgery in the setting of nausea/vomiting. S/p total colectomy and ileostomy on 9/10. Extub 9/12; reintubated 9/13. Trach and cortrak placed 9/18. PMH includes anxiety, depression, substance abuse disorder.    PT Comments    Pt progressing with mobility. Remains limited by decreased activity tolerance, generalized weakness, and fatigue. Demonstrates decreased balance strategies/postural reactions requiring intermittent assist to correct LOB when standing balance is challenged. Decreased insights into deficits and poor ability to self-correct. Continue to recommend intensive CIR-level therapies to maximize functional mobility and return to independent PLOF. Remains motivated to participate. Mother present and supportive.    Follow Up Recommendations  CIR;Supervision for mobility/OOB     Equipment Recommendations  (TBD)    Recommendations for Other Services       Precautions / Restrictions Precautions Precautions: Fall;Other (comment) Precaution Comments: Multiple lines (trach/PMV, ostomy, cortrak, NGT) Restrictions Weight Bearing Restrictions: No    Mobility  Bed Mobility Overal bed mobility: Needs Assistance Bed Mobility: Supine to Sit     Supine to sit: Supervision        Transfers Overall transfer level: Needs assistance   Transfers: Sit to/from Stand Sit to Stand: Min assist         General transfer comment: MinA to steady upon standing, pt with posterior LOB resulting in uncontrolled descent to bed; demonstrates poor balance strategies and ability to self-correct  Ambulation/Gait Ambulation/Gait assistance: Min assist        Gait velocity: Decreased Gait velocity interpretation: <1.8 ft/sec, indicate of risk for recurrent falls General Gait Details: Requesting to amb with RW secondary to fatigue. DOE 3/4; pt reports RPE 4/10 post-ambulation   Stairs             Wheelchair Mobility    Modified Rankin (Stroke Patients Only)       Balance Overall balance assessment: Needs assistance Sitting-balance support: Feet supported;No upper extremity supported Sitting balance-Leahy Scale: Good     Standing balance support: No upper extremity supported Standing balance-Leahy Scale: Fair Standing balance comment: Able to static stand with out UE supports; demonstrates poor balance strategies/postural reactions when balance is challenge requiring assist to prevent LOB                            Cognition Arousal/Alertness: Awake/alert Behavior During Therapy: Flat affect Overall Cognitive Status: Impaired/Different from baseline Area of Impairment: Memory;Safety/judgement;Problem solving;Attention;Awareness                   Current Attention Level: Selective Memory: Decreased short-term memory   Safety/Judgement: Decreased awareness of safety;Decreased awareness of deficits Awareness: Emergent Problem Solving: Slow processing;Requires verbal cues        Exercises      General Comments General comments (skin integrity, edema, etc.): Mother present and very supportive; both remain motivated for CIR      Pertinent Vitals/Pain Pain Assessment: Faces Faces Pain Scale: Hurts a little bit Pain Location: back Pain Descriptors / Indicators: Sore Pain Intervention(s): Monitored during session;Limited activity within patient's tolerance    Home Living  Prior Function            PT Goals (current goals can now be found in the care plan section) Acute Rehab PT Goals Patient Stated Goal: CIR today PT Goal Formulation: With  patient/family Time For Goal Achievement: 10/27/17 Potential to Achieve Goals: Good Progress towards PT goals: Progressing toward goals    Frequency    Min 3X/week      PT Plan Current plan remains appropriate    Co-evaluation              AM-PAC PT "6 Clicks" Daily Activity  Outcome Measure  Difficulty turning over in bed (including adjusting bedclothes, sheets and blankets)?: A Little Difficulty moving from lying on back to sitting on the side of the bed? : A Little Difficulty sitting down on and standing up from a chair with arms (e.g., wheelchair, bedside commode, etc,.)?: Unable Help needed moving to and from a bed to chair (including a wheelchair)?: A Little Help needed walking in hospital room?: A Little Help needed climbing 3-5 steps with a railing? : A Lot 6 Click Score: 15    End of Session Equipment Utilized During Treatment: Gait belt Activity Tolerance: Patient limited by fatigue Patient left: in chair;with call bell/phone within reach;with family/visitor present Nurse Communication: Mobility status(Requesting a shave) PT Visit Diagnosis: Unsteadiness on feet (R26.81);Other abnormalities of gait and mobility (R26.89);Muscle weakness (generalized) (M62.81);Difficulty in walking, not elsewhere classified (R26.2);Pain Pain - part of body: (Back, abdomen)     Time: 1740-8144 PT Time Calculation (min) (ACUTE ONLY): 21 min  Charges:  $Gait Training: 8-22 mins                     Philip Thompson, PT, DPT Acute Rehabilitation Services  Pager [phone] Office [phone]  Philip Thompson 10/20/2017, 12:14 PM

## 2017-10-21 ENCOUNTER — Other Ambulatory Visit: Payer: Self-pay

## 2017-10-21 ENCOUNTER — Inpatient Hospital Stay (HOSPITAL_COMMUNITY)
Admission: RE | Admit: 2017-10-21 | Discharge: 2017-10-27 | DRG: 945 | Disposition: A | Discharge status: Home Health | Payer: BLUE CROSS/BLUE SHIELD | Source: Intra-hospital | Attending: Physical Medicine & Rehabilitation | Admitting: Physical Medicine & Rehabilitation

## 2017-10-21 ENCOUNTER — Encounter (HOSPITAL_COMMUNITY): Payer: Self-pay | Admitting: Emergency Medicine

## 2017-10-21 DIAGNOSIS — R7309 Other abnormal glucose: Secondary | ICD-10-CM

## 2017-10-21 DIAGNOSIS — E871 Hypo-osmolality and hyponatremia: Secondary | ICD-10-CM | POA: Diagnosis not present

## 2017-10-21 DIAGNOSIS — Z419 Encounter for procedure for purposes other than remedying health state, unspecified: Secondary | ICD-10-CM

## 2017-10-21 DIAGNOSIS — E162 Hypoglycemia, unspecified: Secondary | ICD-10-CM | POA: Diagnosis not present

## 2017-10-21 DIAGNOSIS — Z932 Ileostomy status: Secondary | ICD-10-CM | POA: Diagnosis not present

## 2017-10-21 DIAGNOSIS — Y92239 Unspecified place in hospital as the place of occurrence of the external cause: Secondary | ICD-10-CM | POA: Diagnosis not present

## 2017-10-21 DIAGNOSIS — W2100XA Struck by hit or thrown ball, unspecified type, initial encounter: Secondary | ICD-10-CM

## 2017-10-21 DIAGNOSIS — G8929 Other chronic pain: Secondary | ICD-10-CM | POA: Diagnosis present

## 2017-10-21 DIAGNOSIS — Z79890 Hormone replacement therapy (postmenopausal): Secondary | ICD-10-CM

## 2017-10-21 DIAGNOSIS — Z833 Family history of diabetes mellitus: Secondary | ICD-10-CM | POA: Diagnosis not present

## 2017-10-21 DIAGNOSIS — Z79899 Other long term (current) drug therapy: Secondary | ICD-10-CM | POA: Diagnosis not present

## 2017-10-21 DIAGNOSIS — Z7901 Long term (current) use of anticoagulants: Secondary | ICD-10-CM

## 2017-10-21 DIAGNOSIS — K598 Other specified functional intestinal disorders: Secondary | ICD-10-CM | POA: Diagnosis not present

## 2017-10-21 DIAGNOSIS — R5381 Other malaise: Secondary | ICD-10-CM | POA: Diagnosis not present

## 2017-10-21 DIAGNOSIS — Z8249 Family history of ischemic heart disease and other diseases of the circulatory system: Secondary | ICD-10-CM

## 2017-10-21 DIAGNOSIS — F419 Anxiety disorder, unspecified: Secondary | ICD-10-CM | POA: Diagnosis not present

## 2017-10-21 DIAGNOSIS — R000 Tachycardia, unspecified: Secondary | ICD-10-CM | POA: Diagnosis present

## 2017-10-21 DIAGNOSIS — F988 Other specified behavioral and emotional disorders with onset usually occurring in childhood and adolescence: Secondary | ICD-10-CM | POA: Diagnosis present

## 2017-10-21 DIAGNOSIS — Z86711 Personal history of pulmonary embolism: Secondary | ICD-10-CM

## 2017-10-21 DIAGNOSIS — S069X9A Unspecified intracranial injury with loss of consciousness of unspecified duration, initial encounter: Secondary | ICD-10-CM | POA: Diagnosis not present

## 2017-10-21 DIAGNOSIS — F329 Major depressive disorder, single episode, unspecified: Secondary | ICD-10-CM | POA: Diagnosis present

## 2017-10-21 DIAGNOSIS — Z9049 Acquired absence of other specified parts of digestive tract: Secondary | ICD-10-CM

## 2017-10-21 DIAGNOSIS — E039 Hypothyroidism, unspecified: Secondary | ICD-10-CM | POA: Diagnosis not present

## 2017-10-21 DIAGNOSIS — Z87820 Personal history of traumatic brain injury: Secondary | ICD-10-CM

## 2017-10-21 DIAGNOSIS — D72829 Elevated white blood cell count, unspecified: Secondary | ICD-10-CM | POA: Diagnosis not present

## 2017-10-21 DIAGNOSIS — I429 Cardiomyopathy, unspecified: Secondary | ICD-10-CM | POA: Diagnosis present

## 2017-10-21 DIAGNOSIS — Z915 Personal history of self-harm: Secondary | ICD-10-CM

## 2017-10-21 DIAGNOSIS — D62 Acute posthemorrhagic anemia: Secondary | ICD-10-CM

## 2017-10-21 DIAGNOSIS — F32A Depression, unspecified: Secondary | ICD-10-CM | POA: Diagnosis present

## 2017-10-21 DIAGNOSIS — M549 Dorsalgia, unspecified: Secondary | ICD-10-CM | POA: Diagnosis present

## 2017-10-21 DIAGNOSIS — Z95828 Presence of other vascular implants and grafts: Secondary | ICD-10-CM

## 2017-10-21 DIAGNOSIS — E8809 Other disorders of plasma-protein metabolism, not elsewhere classified: Secondary | ICD-10-CM | POA: Diagnosis not present

## 2017-10-21 DIAGNOSIS — Z791 Long term (current) use of non-steroidal anti-inflammatories (NSAID): Secondary | ICD-10-CM

## 2017-10-21 DIAGNOSIS — Z931 Gastrostomy status: Secondary | ICD-10-CM

## 2017-10-21 DIAGNOSIS — Z9181 History of falling: Secondary | ICD-10-CM

## 2017-10-21 DIAGNOSIS — E46 Unspecified protein-calorie malnutrition: Secondary | ICD-10-CM | POA: Diagnosis not present

## 2017-10-21 DIAGNOSIS — Z811 Family history of alcohol abuse and dependence: Secondary | ICD-10-CM

## 2017-10-21 DIAGNOSIS — F1910 Other psychoactive substance abuse, uncomplicated: Secondary | ICD-10-CM | POA: Diagnosis present

## 2017-10-21 DIAGNOSIS — K5909 Other constipation: Secondary | ICD-10-CM | POA: Diagnosis present

## 2017-10-21 DIAGNOSIS — F17210 Nicotine dependence, cigarettes, uncomplicated: Secondary | ICD-10-CM | POA: Diagnosis present

## 2017-10-21 DIAGNOSIS — K5939 Other megacolon: Secondary | ICD-10-CM | POA: Diagnosis present

## 2017-10-21 DIAGNOSIS — E43 Unspecified severe protein-calorie malnutrition: Secondary | ICD-10-CM | POA: Diagnosis not present

## 2017-10-21 DIAGNOSIS — K5981 Ogilvie syndrome: Secondary | ICD-10-CM

## 2017-10-21 LAB — BASIC METABOLIC PANEL
Anion gap: 15 (ref 5–15)
BUN: 25 mg/dL — AB (ref 6–20)
CALCIUM: 10.2 mg/dL (ref 8.9–10.3)
CO2: 32 mmol/L (ref 22–32)
CREATININE: 0.79 mg/dL (ref 0.61–1.24)
Chloride: 82 mmol/L — ABNORMAL LOW (ref 98–111)
GFR calc Af Amer: >60 mL/min (ref >60)
Glucose, Bld: 115 mg/dL — ABNORMAL HIGH (ref 70–99)
POTASSIUM: 3.6 mmol/L (ref 3.5–5.1)
Sodium: 129 mmol/L — ABNORMAL LOW (ref 135–145)

## 2017-10-21 LAB — GLUCOSE, CAPILLARY
GLUCOSE-CAPILLARY: 105 mg/dL — AB (ref 70–99)
GLUCOSE-CAPILLARY: 108 mg/dL — AB (ref 70–99)
GLUCOSE-CAPILLARY: 110 mg/dL — AB (ref 70–99)
Glucose-Capillary: 103 mg/dL — ABNORMAL HIGH (ref 70–99)
Glucose-Capillary: 110 mg/dL — ABNORMAL HIGH (ref 70–99)
Glucose-Capillary: 111 mg/dL — ABNORMAL HIGH (ref 70–99)

## 2017-10-21 LAB — CBC
HCT: 44.4 % (ref 39.0–52.0)
HEMATOCRIT: 43.1 % (ref 39.0–52.0)
HEMOGLOBIN: 14.4 g/dL (ref 13.0–17.0)
Hemoglobin: 14.7 g/dL (ref 13.0–17.0)
MCH: 28.5 pg (ref 26.0–34.0)
MCH: 28.4 pg (ref 26.0–34.0)
MCHC: 33.1 g/dL (ref 30.0–36.0)
MCHC: 33.4 g/dL (ref 30.0–36.0)
MCV: 86.2 fL (ref 78.0–100.0)
MCV: 85 fL (ref 78.0–100.0)
Platelets: 275 10*3/uL (ref 150–400)
Platelets: 277 10*3/uL (ref 150–400)
RBC: 5.15 MIL/uL (ref 4.22–5.81)
RBC: 5.07 MIL/uL (ref 4.22–5.81)
RDW: 12.8 % (ref 11.5–15.5)
RDW: 12.6 % (ref 11.5–15.5)
WBC: 8.1 10*3/uL (ref 4.0–10.5)
WBC: 8.3 10*3/uL (ref 4.0–10.5)

## 2017-10-21 LAB — PHOSPHORUS: Phosphorus: 2.6 mg/dL (ref 2.5–4.6)

## 2017-10-21 LAB — MAGNESIUM
MAGNESIUM: 1.6 mg/dL — AB (ref 1.7–2.4)
Magnesium: 1.8 mg/dL (ref 1.7–2.4)

## 2017-10-21 LAB — CREATININE, SERUM: Creatinine, Ser: 0.64 mg/dL (ref 0.61–1.24)

## 2017-10-21 MED ORDER — ACETAMINOPHEN 160 MG/5ML PO SOLN
650.0000 mg | Freq: Four times a day (QID) | ORAL | Status: DC | PRN
  Administered 2017-10-23: 650 mg
  Filled 2017-10-21: qty 20.3

## 2017-10-21 MED ORDER — FENTANYL 12 MCG/HR TD PT72: 75.0000 ug | MEDICATED_PATCH | TRANSDERMAL | Status: DC

## 2017-10-21 MED ORDER — POLYETHYLENE GLYCOL 3350 17 G PO PACK: 17.0000 g | PACK | Freq: Every day | ORAL | Status: DC | PRN

## 2017-10-21 MED ORDER — CITALOPRAM HYDROBROMIDE 10 MG/5ML PO SOLN
20.0000 mg | Freq: Every day | ORAL | Status: DC
  Administered 2017-10-22 – 2017-10-27 (×6): 20 mg
  Filled 2017-10-21 (×6): qty 10

## 2017-10-21 MED ORDER — TRAZODONE HCL 50 MG PO TABS: 25.0000 mg | ORAL_TABLET | Freq: Every evening | ORAL | Status: DC | PRN

## 2017-10-21 MED ORDER — CLONAZEPAM 0.5 MG PO TABS
1.0000 mg | ORAL_TABLET | Freq: Three times a day (TID) | ORAL | Status: DC
  Administered 2017-10-21 – 2017-10-27 (×17): 1 mg
  Filled 2017-10-21 (×17): qty 2

## 2017-10-21 MED ORDER — PROPRANOLOL HCL 20 MG/5ML PO SOLN
40.0000 mg | Freq: Three times a day (TID) | ORAL | Status: DC
  Administered 2017-10-21 – 2017-10-27 (×17): 40 mg
  Filled 2017-10-21 (×19): qty 10

## 2017-10-21 MED ORDER — TRAZODONE HCL 50 MG PO TABS
50.0000 mg | ORAL_TABLET | Freq: Every day | ORAL | Status: DC
  Administered 2017-10-21 – 2017-10-26 (×6): 50 mg
  Filled 2017-10-21 (×6): qty 1

## 2017-10-21 MED ORDER — ENOXAPARIN SODIUM 60 MG/0.6ML ~~LOC~~ SOLN: 60.0000 mg | Freq: Two times a day (BID) | SUBCUTANEOUS | Status: DC

## 2017-10-21 MED ORDER — VITAL AF 1.2 CAL PO LIQD: 1500.0000 mL | ORAL | Status: DC

## 2017-10-21 MED ORDER — OXYCODONE HCL 5 MG PO TABS
10.0000 mg | ORAL_TABLET | ORAL | Status: DC | PRN
  Administered 2017-10-21 – 2017-10-27 (×15): 10 mg
  Filled 2017-10-21 (×15): qty 2

## 2017-10-21 MED ORDER — LEVALBUTEROL HCL 0.63 MG/3ML IN NEBU: 0.6300 mg | INHALATION_SOLUTION | RESPIRATORY_TRACT | Status: DC | PRN

## 2017-10-21 MED ORDER — FENTANYL 75 MCG/HR TD PT72
75.0000 ug | MEDICATED_PATCH | TRANSDERMAL | Status: DC
  Administered 2017-10-21 – 2017-10-24 (×2): 75 ug via TRANSDERMAL
  Filled 2017-10-21 (×2): qty 1

## 2017-10-21 MED ORDER — VALPROIC ACID 250 MG/5ML PO SOLN
500.0000 mg | Freq: Two times a day (BID) | ORAL | Status: DC
  Administered 2017-10-21 – 2017-10-27 (×12): 500 mg
  Filled 2017-10-21 (×12): qty 10

## 2017-10-21 MED ORDER — BISACODYL 10 MG RE SUPP: 10.0000 mg | Freq: Every day | RECTAL | Status: DC | PRN

## 2017-10-21 MED ORDER — CLONAZEPAM 1 MG PO TABS
1.0000 mg | ORAL_TABLET | Freq: Three times a day (TID) | ORAL | Status: DC
  Administered 2017-10-21 (×2): 1 mg via ORAL
  Filled 2017-10-21 (×2): qty 1

## 2017-10-21 MED ORDER — DIPHENHYDRAMINE HCL 12.5 MG/5ML PO ELIX: 12.5000 mg | ORAL_SOLUTION | Freq: Four times a day (QID) | ORAL | Status: DC | PRN

## 2017-10-21 MED ORDER — OXYCODONE HCL 5 MG/5ML PO SOLN: 10.0000 mg | ORAL | 0 refills | Status: DC | PRN

## 2017-10-21 MED ORDER — QUETIAPINE FUMARATE 50 MG PO TABS
200.0000 mg | ORAL_TABLET | Freq: Every day | ORAL | Status: DC
  Administered 2017-10-21 – 2017-10-26 (×6): 200 mg
  Filled 2017-10-21 (×6): qty 4

## 2017-10-21 MED ORDER — GABAPENTIN 250 MG/5ML PO SOLN
300.0000 mg | Freq: Two times a day (BID) | ORAL | Status: DC
  Administered 2017-10-21 – 2017-10-27 (×12): 300 mg
  Filled 2017-10-21 (×13): qty 6

## 2017-10-21 MED ORDER — POTASSIUM CHLORIDE 20 MEQ/15ML (10%) PO SOLN: 40.0000 meq | Freq: Every day | ORAL | 0 refills | Status: DC

## 2017-10-21 MED ORDER — FAMOTIDINE 40 MG/5ML PO SUSR
20.0000 mg | Freq: Two times a day (BID) | ORAL | Status: DC
  Administered 2017-10-21 – 2017-10-27 (×12): 20 mg
  Filled 2017-10-21 (×12): qty 2.5

## 2017-10-21 MED ORDER — BUSPIRONE HCL 10 MG PO TABS
10.0000 mg | ORAL_TABLET | Freq: Three times a day (TID) | ORAL | Status: DC
  Administered 2017-10-21 – 2017-10-27 (×17): 10 mg
  Filled 2017-10-21 (×18): qty 1

## 2017-10-21 MED ORDER — FLEET ENEMA 7-19 GM/118ML RE ENEM: 1.0000 | ENEMA | Freq: Once | RECTAL | Status: DC | PRN

## 2017-10-21 MED ORDER — LEVOTHYROXINE SODIUM 75 MCG PO TABS
75.0000 ug | ORAL_TABLET | Freq: Every day | ORAL | Status: DC
  Administered 2017-10-22 – 2017-10-27 (×6): 75 ug
  Filled 2017-10-21 (×6): qty 1

## 2017-10-21 MED ORDER — FLUTICASONE PROPIONATE 50 MCG/ACT NA SUSP
2.0000 | Freq: Every day | NASAL | Status: DC
  Administered 2017-10-22 – 2017-10-27 (×6): 2 via NASAL

## 2017-10-21 MED ORDER — PIVOT 1.5 CAL PO LIQD
1000.0000 mL | ORAL | Status: DC
  Administered 2017-10-21 – 2017-10-25 (×6): 1000 mL
  Filled 2017-10-21 (×10): qty 1000

## 2017-10-21 MED ORDER — BACITRACIN-NEOMYCIN-POLYMYXIN 400-5-5000 EX OINT
1.0000 "application " | TOPICAL_OINTMENT | Freq: Every day | CUTANEOUS | Status: AC
  Administered 2017-10-22 – 2017-10-23 (×2): 1 via TOPICAL
  Filled 2017-10-21 (×2): qty 1

## 2017-10-21 MED ORDER — POTASSIUM CHLORIDE 20 MEQ/15ML (10%) PO SOLN
40.0000 meq | Freq: Two times a day (BID) | ORAL | Status: DC
  Administered 2017-10-21 – 2017-10-22 (×3): 40 meq
  Filled 2017-10-21 (×4): qty 30

## 2017-10-21 MED ORDER — OXYCODONE HCL 5 MG/5ML PO SOLN: 10.0000 mg | ORAL | Status: DC | PRN

## 2017-10-21 MED ORDER — APIXABAN 5 MG PO TABS
5.0000 mg | ORAL_TABLET | Freq: Two times a day (BID) | ORAL | Status: DC
  Administered 2017-10-21 – 2017-10-27 (×12): 5 mg via ORAL
  Filled 2017-10-21 (×12): qty 1

## 2017-10-21 MED ORDER — NICOTINE 21 MG/24HR TD PT24
21.0000 mg | MEDICATED_PATCH | Freq: Every day | TRANSDERMAL | Status: DC
  Administered 2017-10-22 – 2017-10-25 (×4): 21 mg via TRANSDERMAL
  Filled 2017-10-21 (×4): qty 1

## 2017-10-21 NOTE — Progress Notes (Signed)
PMR Admission Coordinator Pre-Admission Assessment  Patient: Philip Thompson is an 27 y.o., male MRN: [id number] DOB: 01/23/1990 Height: 5' 7.99" (172.7 cm) Weight: 60.2 kg                                                                                                                                                  Insurance Information HMO:     PPO: Yes     PCP:      IPA:      80/20:      OTHER: PRIMARY: [employer] [place]      Policy#: [id number]      Subscriber: Self CM Name: Hayes Ludwig      Phone#: [phone]     Fax#: [phone] Pre-Cert#: [id number] for 10/21/17-11/02/17 with request for faxed updates on 11/01/17     Employer: Not employed  Benefits:  Phone #: [phone]     Name: Verified online at [email or web address] Eff. Date: 07/18/17     Deduct: $600      Out of Pocket Max: $2400      Life Max: N/A CIR: 70%/30%      SNF: 70%/30% Outpatient: 30 combined visits     Co-Pay: $20 per visit  Home Health: 70%      Co-Pay: 30% DME: 70%     Co-Pay: 30% Providers: In-network   SECONDARY: None        Emergency Contact Information         Contact Information    Name Relation Home Work Mobile   Corum,Cathy Mother [phone]  [phone]   Lauretta Grill   [phone]     Current Medical History  Patient Admitting Diagnosis: Debility  History of Present Illness: Philip Thompson is a 27 year oldright-handedmale with history of MDD, multiple TBIs, as well as anoxic BI, chronic pain, SI, polysubstance abuse, and chronic constipation; who was admitted to [facility] on 09/21/2017 with 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 He was transferred to [facility] for treatment and CT scan done revealing bilateral PE as well as massive distention of colon. Dr. August Saucer was consulted for input and recommended neostigmine trial with monitoring. He was started on IV heparin for PE and and cardiology consulted due to reports of chest pain.  Pain felt to be noncardiac in nature and and maintained on anticoagulation initially with Xarelto but held due to drain site bleeding and an IVC filter was placed 10/02/2017 per interventional radiology with recommendations to retrieve filter after 4 weeks.Initial plans were to transition Lovenox to Eliquis.As he had no improvement in colonic inertia, he was taken to the OR for subtotal colectomy and ileostomy on 09/27/2017 by Dr. Magnus Ivan. He required intubation and sedation as well as NG tube due to high volume outputas well as TPN for nutritional support. He has had issues  with anxiety, agitation and difficulty with vent wean and difficulty with vent wean requiring tracheostomy on 9/18 by Dr. Terrall Laity decannulated 10/20/2017.   Psychiatry consulted for input due to suicidal ideation with worsening of anxiety and poor sleep.Initially maintained on Precedex for agitation weaned off 10/10/2017. Multiple medication changes recommended and psych has signed off as "no evidence of imminent risk to self or others noted."He continued to have high volume output from NG tube and gastrostomy tube was placed by Dr. Shella Spearing on 10/17/2017. He was noted to have high output through G-tube to gravity therefore tube feeds resumedvia cortakand gastric tube converted to G-J tube on10/02/2017.It has been discussed at length that continued feeds are through J-tube and gravity bag maintained to G-tube.  Continues to be deconditioned requiring multiple rest breaks as well as tachycardia noted with activity. CIR recommended due to debility and patient admitted 10/21/17.   Past Medical History  Past Medical History:  Diagnosis Date  . ADD (attention deficit disorder)   . Aggression   . Allergic rhinitis   . Anxiety   . Arthritis    joint pain  . Cardiomyopathy (HCC)    a. EF <25% in 2015 during admission for multi substance intoxication/respiratory failure requiring intubation.  . Chronic back  pain   . Constipation   . Depression   . Fecal obstruction (HCC)   . Frontal head injury   . Heroin overdose (HCC)   . Obstipation   . Polysubstance abuse (HCC)   . Suicide attempt (HCC)     Family History  family history includes Alcohol abuse in his father; Cancer in his paternal grandfather; Cancer - Lung in his paternal grandmother; Cancer - Ovarian in his maternal grandmother; Dementia in his father; Diabetes in his brother and other; Heart attack in his other; Heart disease in his other; Hypertension in his other.  Prior Rehab/Hospitalizations:  Has the patient had major surgery during 100 days prior to admission? No  Current Medications   Current Facility-Administered Medications:  .  acetaminophen (TYLENOL) solution 650 mg, 650 mg, Per Tube, Q6H PRN, Lonia Blood, MD, 650 mg at 10/18/17 1354 .  acetaminophen (TYLENOL) suppository 650 mg, 650 mg, Rectal, Q4H PRN, Jetty Duhamel T, MD .  alteplase (CATHFLO ACTIVASE) injection 2 mg, 2 mg, Intracatheter, Once, Pokhrel, Laxman, MD .  busPIRone (BUSPAR) tablet 10 mg, 10 mg, Per Tube, BID, Randel Pigg, Dorma Russell, MD, 10 mg at 10/21/17 [phone] .  chlorhexidine (PERIDEX) 0.12 % solution 15 mL, 15 mL, Mouth Rinse, BID, Lonia Blood, MD, 15 mL at 10/21/17 0951 .  Chlorhexidine Gluconate Cloth 2 % PADS 6 each, 6 each, Topical, Daily, Franky Macho, MD, 6 each at 10/20/17 1052 .  citalopram (CELEXA) 10 MG/5ML suspension 20 mg, 20 mg, Per Tube, Daily, Selmer Dominion B, NP, 20 mg at 10/21/17 0950 .  clonazePAM (KLONOPIN) tablet 1 mg, 1 mg, Oral, Q8H, Pokhrel, Laxman, MD, 1 mg at 10/21/17 0645 .  enoxaparin (LOVENOX) injection 60 mg, 60 mg, Subcutaneous, BID, Pokhrel, Laxman, MD, 60 mg at 10/21/17 0951 .  famotidine (PEPCID) 40 MG/5ML suspension 20 mg, 20 mg, Per Tube, BID, Titus Mould, RPH, 20 mg at 10/21/17 0950 .  feeding supplement (VITAL AF 1.2 CAL) liquid 1,500 mL, 1,500 mL, Per Tube, Continuous, Lonia Blood, MD, Last Rate: 80 mL/hr at 10/21/17 0651, 1,500 mL at 10/21/17 0651 .  fluticasone (FLONASE) 50 MCG/ACT nasal spray 2 spray, 2 spray, Each Nare, Daily, Sharon Seller, Elpidio Eric, MD, 2  spray at 10/21/17 1008 .  gabapentin (NEURONTIN) 250 MG/5ML solution 300 mg, 300 mg, Per Tube, Q12H, Selmer Dominion B, NP, 300 mg at 10/21/17 0951 .  hydrOXYzine (ATARAX) 10 MG/5ML syrup 10 mg, 10 mg, Per Tube, TID PRN, Lonia Blood, MD, 10 mg at 10/20/17 1730 .  levalbuterol (XOPENEX) nebulizer solution 0.63 mg, 0.63 mg, Nebulization, Q3H PRN, Lonia Blood, MD, 0.63 mg at 10/18/17 0208 .  levothyroxine (SYNTHROID, LEVOTHROID) injection 37.5 mcg, 37.5 mcg, Intravenous, QAC breakfast, Desai, Rahul P, PA-C, 37.5 mcg at 10/21/17 0952 .  MEDLINE mouth rinse, 15 mL, Mouth Rinse, q12n4p, Lonia Blood, MD, 15 mL at 10/20/17 1636 .  metoCLOPramide (REGLAN) injection 10 mg, 10 mg, Intravenous, Q6H, Violeta Gelinas, MD, 10 mg at 10/21/17 0645 .  neomycin-bacitracin-polymyxin (NEOSPORIN) ointment 1 application, 1 application, Topical, Daily, Gilmer Mor, DO, 1 application at 10/20/17 1052 .  nicotine (NICODERM CQ - dosed in mg/24 hours) patch 21 mg, 21 mg, Transdermal, Daily, Alyson Reedy, MD, 21 mg at 10/21/17 1022 .  [DISCONTINUED] ondansetron (ZOFRAN) tablet 4 mg, 4 mg, Oral, Q6H PRN **OR** ondansetron (ZOFRAN) injection 4 mg, 4 mg, Intravenous, Q6H PRN, Sherrie George, PA-C, 4 mg at 10/07/17 2112 .  oxyCODONE (ROXICODONE) 5 MG/5ML solution 10 mg, 10 mg, Per Tube, Q4H PRN, Pokhrel, Laxman, MD, 10 mg at 10/20/17 1635 .  potassium chloride 20 MEQ/15ML (10%) solution 40 mEq, 40 mEq, Per Tube, BID, Pokhrel, Laxman, MD, 40 mEq at 10/21/17 0952 .  propranolol (INDERAL) 20 MG/5ML solution 40 mg, 40 mg, Per Tube, TID, Lonia Blood, MD, 40 mg at 10/21/17 0951 .  QUEtiapine (SEROQUEL) tablet 200 mg, 200 mg, Per Tube, QHS, Alyson Reedy, MD, 200 mg at 10/20/17 2121 .  QUEtiapine (SEROQUEL) tablet 200 mg, 200  mg, Oral, QHS PRN, Randel Pigg, Dorma Russell, MD, 200 mg at 10/16/17 0030 .  sodium chloride flush (NS) 0.9 % injection 10-40 mL, 10-40 mL, Intracatheter, Q12H, Franky Macho, MD, 10 mL at 10/19/17 2227 .  sodium chloride flush (NS) 0.9 % injection 10-40 mL, 10-40 mL, Intracatheter, PRN, Franky Macho, MD, 10 mL at 10/20/17 0530 .  sodium chloride flush (NS) 0.9 % injection 3 mL, 3 mL, Intravenous, Q12H, Sherrie George, PA-C, 3 mL at 10/20/17 2300 .  traZODone (DESYREL) tablet 100 mg, 100 mg, Oral, QHS, Simpson, Paula B, NP, 100 mg at 10/20/17 2121 .  valproic acid (DEPAKENE) solution 500 mg, 500 mg, Per Tube, BID, Desai, Rahul P, PA-C, 500 mg at 10/21/17 0950  Patients Current Diet:  Diet Order                  Diet NPO time specified Except for: Ice Chips  Diet effective now               Precautions / Restrictions Precautions Precautions: Fall Precaution Comments: Multiple lines (trach/PMV, ostomy, cortrak, NGT) Other Brace/Splint: NGT, coretrack, trach, PMV, ostomy Restrictions Weight Bearing Restrictions: No   Has the patient had 2 or more falls or a fall with injury in the past year?No  Prior Activity Level Household: Prior to admission patient mostly stayed home except for medical appointments.  He enjoys woodworking and listening to pod casts.  He loves animals and has a dog at home.  He lives with his dad who is disabled and mom is over daily to assist.   Journalist, newspaper / Equipment Home Assistive Devices/Equipment: None Home Equipment: None  Prior Device Use: Indicate devices/aids used  by the patient prior to current illness, exacerbation or injury? 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  Independent with verbal supervision, reminders to do it  Indoor Mobility:  Did the patient need assistance with walking from room to room (with or without device)? Independent  Stairs: Did the patient need assistance with internal or external stairs (with or without device)? Independent  Functional Cognition: Did the patient need help planning regular tasks such as shopping or remembering to take medications? Dependent  Current Functional Level Cognition  Overall Cognitive Status: Impaired/Different from baseline Current Attention Level: Selective Orientation Level: Oriented X4 Following Commands: Follows one step commands consistently Safety/Judgement: Decreased awareness of safety, Decreased awareness of deficits General Comments: discussed plan with pt, and left to get theraband.  Upon returning to room, pt asked "what are we doing now?" despite explanation provided earlier     Extremity Assessment (includes Sensation/Coordination)  Upper Extremity Assessment: Generalized weakness  Lower Extremity Assessment: Defer to PT evaluation    ADLs  Overall ADL's : Needs assistance/impaired Eating/Feeding: NPO Grooming: Wash/dry face, Moderate assistance, Sitting Grooming Details (indicate cue type and reason): assist primarily due to cortrak and NGT Upper Body Bathing: Set up, Bed level Lower Body Bathing: Minimal assistance, Sit to/from stand Upper Body Dressing : Minimal assistance, Sitting Upper Body Dressing Details (indicate cue type and reason): front opening gown Lower Body Dressing: Supervision/safety, Set up Lower Body Dressing Details (indicate cue type and reason): min guard A sit<>stand at EOB Toilet Transfer: Min guard, Ambulation, Comfort height toilet, Grab bars, RW Toilet Transfer Details (indicate cue type and reason): assist to safely maneuver RW in small spaces  Toileting- Clothing Manipulation and Hygiene: Moderate assistance, Sit to/from stand Functional mobility during ADLs: Minimal assistance, Min guard, Rolling walker General ADL  Comments: RN assisting with lines.    Mobility  Overal bed mobility: Needs Assistance Bed Mobility: Supine to Sit, Sit to Supine Rolling: Max assist, +2 for physical assistance Supine to sit: Supervision, HOB elevated Sit to supine: Supervision, HOB elevated General bed mobility comments: Pt sitting up in chair     Transfers  Overall transfer level: Needs assistance Equipment used: Rolling walker (2 wheeled) Transfers: Sit to/from Stand Sit to Stand: Min guard General transfer comment: VCs for safe hand placement    Ambulation / Gait / Stairs / Wheelchair Mobility  Ambulation/Gait Ambulation/Gait assistance: Editor, commissioning (Feet): 50 Feet Assistive device: Rolling walker (2 wheeled) Gait Pattern/deviations: Step-through pattern, Shuffle, Trunk flexed General Gait Details: Requesting to amb with RW secondary to fatigue. DOE 3/4; pt reports RPE 4/10 post-ambulation Gait velocity: Decreased Gait velocity interpretation: <1.8 ft/sec, indicate of risk for recurrent falls    Posture / Balance Balance Overall balance assessment: Needs assistance Sitting-balance support: No upper extremity supported, Feet supported Sitting balance-Leahy Scale: Good Standing balance support: No upper extremity supported, During functional activity, Single extremity supported Standing balance-Leahy Scale: Poor Standing balance comment: sit>stand from toilet with grab    Special needs/care consideration BiPAP/CPAP: No CPM: No Continuous Drip IV: No, but continuous J-tube feeds and G-tube to drain to gravity Dialysis: No         Life Vest: No Oxygen: No Special Bed: No Trach Size: Decannulated 10/20/17 and will required stoma care Wound Vac (area): No Skin: Surgical incision to abdomen to monitor; Moisture  Associated Skin Damage to buttocks         Per WOC RN note from 10/1:  WOC Nurse ostomy follow up Demonstrated pouch change to patient. He assisted with the procedure and asked  appropriate questions. Stoma type/location:Ileostomy stoma is red and viable, flush with skin level, 1 1/4 inches, loose skin folds surrounding at 9:00 o'clock and 3:00 o'clock Peristomal assessment:intact skin surrounding Output: Mod amt green liquid stool Ostomy pouching: 1pc.  Education provided:Applied one piece flexible convex pouch with barrier ring to attempt to maintain a seal. Pt was able to open and close velcro to empty. No family members at the bedside, but pt states he plans to be independent with the process after discharge. WOC team will continue teaching sessions twice a week. Supplies ordered to the bedside for staff nurse use. Reviewed pouching routines. Enrolled patient in [place] Secure Start Discharge program: No WOC Nurse wound consult note Reason for Consult:Pt previously had a stage 3 pressure injury to sacrum. Upon assessment today, it is a dry healing scabbed area without depth or drainage Dressing procedure/placement/frequency:Foam dressing to protect from further injury. No further topical treatment is indicated.                         Bowel mgmt: + colostomy output  Bladder mgmt: Continent  Diabetic mgmt: No     Previous Home Environment Living Arrangements: Parent Available Help at Discharge: Family, Available PRN/intermittently Type of Home: House Home Layout: One level Home Access: Stairs to enter Entrance Stairs-Rails: None Secretary/administrator of Steps: 2 Bathroom Shower/Tub: Engineer, manufacturing systems: Standard Home Care Services: No Additional Comments: [name] mother and pt confirmed above info  Discharge Living Setting Plans for Discharge Living Setting: Patient's home, Lives with (comment)(dad) Type of Home at Discharge: House Discharge Home Layout: One level Discharge Home Access: Level entry Discharge Bathroom Shower/Tub: Tub/shower unit, Curtain Discharge Bathroom Toilet: Standard Discharge Bathroom Accessibility:  Yes How Accessible: Accessible via walker Does the patient have any problems obtaining your medications?: No  Social/Family/Support Systems Patient Roles: Other (Comment)(Son ) Contact Information: Mom: Lynden Ang [phone] Anticipated Caregiver: Mom  Anticipated Caregiver's Contact Information: see above  Ability/Limitations of Caregiver: Mom works nights Fri, Sat, Sun, Mon Caregiver Availability: Intermittent Discharge Plan Discussed with Primary Caregiver: Yes(Cathy reports she can take FMLA upon discharge ) Is Caregiver In Agreement with Plan?: Yes Does Caregiver/Family have Issues with Lodging/Transportation while Pt is in Rehab?: No  Goals/Additional Needs Patient/Family Goal for Rehab: PT/OT/SLP: Supervision-Mod I Expected length of stay: 7-12 days  Cultural Considerations: None Dietary Needs: NPO  Equipment Needs: TBD Pt/Family Agrees to Admission and willing to participate: Yes Program Orientation Provided & Reviewed with Pt/Caregiver Including Roles  & Responsibilities: Yes Additional Information Needs: Education/teaching with patient and mom needs to be done for tubes and iluestomy  Information Needs to be Provided By: Team   Barriers to Discharge: Medical stability, Other (comments), Decreased caregiver support(education and teaching )  Decrease burden of Care through IP rehab admission: No  Possible need for SNF placement upon discharge: No  Patient Condition: This patient's medical and functional status has changed since the consult dated: 10/11/17 in which the Rehabilitation Physician determined and documented that the patient's condition is appropriate for intensive rehabilitative care in an inpatient rehabilitation facility. See "History of Present Illness" (above) for medical update. Functional changes are: Min A transfers and Min A gait with patient requesting use of rolling walker due to fatigue. Patient's  medical and functional status update has been discussed  with the Rehabilitation physician and patient remains appropriate for inpatient rehabilitation. Will admit to inpatient rehab today.  Preadmission Screen Completed By:  Fae Pippin, 10/21/2017 11:25 AM ______________________________________________________________________   Discussed status with Dr. Riley Kill on 10/21/17 at 1128 and received telephone approval for admission today.  Admission Coordinator:  Fae Pippin, time 1128/Date 10/21/17           Cosigned by: Ranelle Oyster, MD at 10/21/2017 12:09 PM  Revision History

## 2017-10-21 NOTE — Consult Note (Signed)
WOC Nurse ostomy follow up Stoma type/location: RLQ, end ileostomy Stomal assessment/size: 1 1/4" per last pouch change  Peristomal assessment: NA Treatment options for stomal/peristomal skin: using 2" skin barrier ring  Output brown, liquid Ostomy pouching: 1pc.flexible convex in place, however patient prefers clear pouch. I have placed 4 clear convex pouches and 4 barrier rings in patients room and requested that the nursing staff make sure that supplies move with the patient should he DC to rehab. Education provided:  Provided mother with handouts with pictures for pouch change and use of barrier ring Answered patient questions about emptying and cleaning spout.  Enrolled patient in [place] Secure Start Discharge program: Yes   WOC Nurse will follow along with you for continued support with ostomy teaching and care Philip Thompson [facility] MSN, RN, [place], CNS, [place] [phone]

## 2017-10-21 NOTE — Discharge Summary (Signed)
Physician Discharge Summary  Philip Thompson ZOX:[id number] DOB: 1990-12-24 DOA: 09/21/2017  PCP: [name], MD  Admit date: 09/21/2017   Discharge date: 10/21/2017  Admitted From: Home  Disposition: CIR  Discharge Condition:Stable  CODE STATUS: FULL,  Diet recommendation:  J-tube feeding  Brief/Interim Summary:  27 year old male with a hx of polysubstance abuse, major depression, anxiety, chronic constipation, and hypothyroidism, presented to the ED with severe abdominal pain and distention with nausea and vomiting. In the ED, he was found to have colonic inertia/megacolon secondary to constipation, along with a pulmonary embolism.  Patient underwent total colectomy and ileostomy and received tracheostomy for failure to wean from ventilation.  Received IVC filter due to bleeding while on a drain.    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  On J tube received on 10/19/2017 and tolerating tube feedings well.  Hypokalemia.  Replenished potassium with improvement.  Patient might need continued potassium replacement at the inpatient rehab.  Acute hypoxic respiratory failure status post tracheostomy secondary to mucous plugging, pulmonary embolism and abdominal compartment syndrome.    Patient has been decannulated on 10/20/2017 and his respiratory status has been stable.  We will need continued stoma care.  Pulmonary embolism/DVT likely provoked secondary to bowel obstruction/abdominal compartment syndrome. Patient was initially started on anticoagulation but was held on 9/14 drain site bleeding and a retrievable filter was placed on 9/15.  Currently on full dose Lovenox , IR recommending to retrieve filter after 4 weeks (~10/15).    Would recommend change Lovenox to Eliquis on discharge to complete the course of anticoagulation.  Sinus Tachycardia.  TSH was within normal limits.   Cortisol normal.    Patient is currently on propanolol, we will continue with that.  No likelihood of withdrawal at this time.  Recent sepsis due to Staph aureus and E. Coli in the bronchoalveolar  lavage.  Patient has completed a course of antibiotics.  Currently afebrile without any leukocytosis.  Anxiety/depression -does have history of substance abuse.    Does have residual anxiety but is on multiple medications.  Patient was seen by psychiatry this admission.  Patient is extremely motivated to improve  Hypothyroidism: continue Synthroid  Severe calorie malnutrition in context of chronic illness.  Continue nutrition through J-tube   Disposition.  Patient has been considered stable for disposition to CIR today.  Spoke with CIR regarding the plan for disposition.  I also spoke with the patient's mother at bedside today.   Discharge Diagnoses:  Principal Problem:   Anxiety and depression Active Problems:   MDD (major depressive disorder), recurrent severe, without psychosis (HCC)   Leg edema   Fecal impaction (HCC)   Ogilvie's syndrome   Acute respiratory failure with hypoxia (HCC)   Acute pulmonary embolism without acute cor pulmonale (HCC)   Hypothyroidism   Colon distention   Preoperative cardiovascular examination   Protein-calorie malnutrition, severe   Acute respiratory failure with hypoxemia (HCC)   Pressure injury of skin   Protein calorie malnutrition (HCC)   Tracheostomy care (HCC)   Tracheostomy in place [facility])   History of traumatic brain injury   Anoxic brain injury (HCC)   Chronic pain syndrome   Sinus tachycardia   Tachypnea   Leukocytosis   Discharge Instructions  Discharge Instructions    Discharge instructions   Complete by:  As directed    Continue current medication.  Patient will need to follow-up with interventional radiology to remove  the IVC filter in a month.  Please change his anticoagulation from Lovenox to Eliquis oral anticoagulation on  discharge.   Increase activity slowly   Complete by:  As directed      Allergies as of 10/21/2017   No Known Allergies     Medication List    STOP taking these medications   cyclobenzaprine 10 MG tablet Commonly known as:  FLEXERIL   divalproex 500 MG DR tablet Commonly known as:  DEPAKOTE   docusate sodium 100 MG capsule Commonly known as:  COLACE   furosemide 20 MG tablet Commonly known as:  LASIX   ibuprofen 600 MG tablet Commonly known as:  ADVIL,MOTRIN   levothyroxine 50 MCG tablet Commonly known as:  SYNTHROID, LEVOTHROID Replaced by:  levothyroxine 100 MCG Solr injection   linaclotide 290 MCG Caps capsule Commonly known as:  LINZESS   potassium chloride 10 MEQ tablet Commonly known as:  K-DUR,KLOR-CON   QUEtiapine 400 MG tablet Commonly known as:  SEROQUEL   traMADol 50 MG tablet Commonly known as:  ULTRAM     TAKE these medications   acetaminophen 160 MG/5ML solution Commonly known as:  TYLENOL Place 20.3 mLs (650 mg total) into feeding tube every 6 (six) hours as needed for mild pain, headache or fever.   busPIRone 15 MG tablet Commonly known as:  BUSPAR Take 1 tablet (15 mg total) by mouth 3 (three) times daily.   citalopram 40 MG tablet Commonly known as:  CELEXA Take 1 tablet (40 mg total) by mouth daily.   clonazePAM 1 MG disintegrating tablet Commonly known as:  KLONOPIN Take 1 tablet (1 mg total) by mouth every 8 (eight) hours.   enoxaparin 60 MG/0.6ML injection Commonly known as:  LOVENOX Inject 0.6 mLs (60 mg total) into the skin 2 (two) times daily.   famotidine 40 MG/5ML suspension Commonly known as:  PEPCID Place 2.5 mLs (20 mg total) into feeding tube 2 (two) times daily.   feeding supplement (VITAL AF 1.2 CAL) Liqd Place 1,500 mLs into feeding tube continuous.   fluticasone 50 MCG/ACT nasal spray Commonly known as:  FLONASE Place 2 sprays into both nostrils daily.   gabapentin 300 MG capsule Commonly known as:   NEURONTIN Take 2 capsules (600 mg total) by mouth 3 (three) times daily. For agitation   hydrOXYzine 10 MG/5ML syrup Commonly known as:  ATARAX Place 5 mLs (10 mg total) into feeding tube 3 (three) times daily as needed for anxiety.   levalbuterol 0.63 MG/3ML nebulizer solution Commonly known as:  XOPENEX Take 3 mLs (0.63 mg total) by nebulization every 3 (three) hours as needed for wheezing.   levothyroxine 100 MCG Solr injection Commonly known as:  SYNTHROID, LEVOTHROID Inject 1.88 mLs (37.5 mcg total) into the vein daily before breakfast. Replaces:  levothyroxine 50 MCG tablet   metoCLOPramide 5 MG/ML injection Commonly known as:  REGLAN Inject 2 mLs (10 mg total) into the vein every 6 (six) hours.   mouth rinse Liqd solution 15 mLs by Mouth Rinse route 2 times daily at 12 noon and 4 pm.   neomycin-bacitracin-polymyxin ointment Commonly known as:  NEOSPORIN Apply 1 application topically daily.   nicotine 21 mg/24hr patch Commonly known as:  NICODERM CQ - dosed in mg/24 hours Place 1 patch (21 mg total) onto the skin daily.   oxyCODONE 5 MG/5ML solution Commonly known as:  ROXICODONE Place 5 mLs (5 mg total) into feeding tube every 6 (six) hours.   oxyCODONE 5 MG/5ML solution  Commonly known as:  ROXICODONE Place 10 mLs (10 mg total) into feeding tube every 4 (four) hours as needed for moderate pain or severe pain.   potassium chloride 20 MEQ/15ML (10%) Soln Place 30 mLs (40 mEq total) into feeding tube daily for 5 days.   propranolol 20 MG/5ML solution Commonly known as:  INDERAL Place 10 mLs (40 mg total) into feeding tube 3 (three) times daily.   traZODone 100 MG tablet Commonly known as:  DESYREL Take 1 tablet (100 mg total) by mouth at bedtime.   valproic acid 250 MG/5ML Soln solution Commonly known as:  DEPAKENE Place 10 mLs (500 mg total) into feeding tube 2 (two) times daily.      Follow-up Information    Abigail Miyamoto, MD Follow up in 2 week(s).    Specialty:  General Surgery Why:  call to make appointment to see Dr. Magnus Ivan 2 weeks after your discharge from inpatient rehab Contact information: [address] [place] [address] [phone]          No Known Allergies  Consultations: General surgery, wound care, PCCM, interventional radiology, GI, psychiatry   Procedures/Studies: Dg Abd 1 View  Result Date: 10/14/2017 CLINICAL DATA:  27 year old male with enteric tube placement EXAM: ABDOMEN - 1 VIEW COMPARISON:  Abdominal radiograph dated 10/13/2017 FINDINGS: A feeding tube is noted with tip in the third portion of the duodenum. A NG tube with side-port in the body of the stomach and tip in the distal stomach. No bowel dilatation. Partially visualized right lower quadrant ostomy. IVC filter. Right lung base atelectatic changes. Partially visualized right-sided central venous line with tip close to the cavoatrial junction. IMPRESSION: NG tube with tip in the distal stomach and a feeding tube with tip in the distal duodenum. Electronically Signed   By: Elgie Collard M.D.   On: 10/14/2017 03:38   Dg Abd 1 View  Result Date: 10/13/2017 CLINICAL DATA:  Abdominal distention EXAM: ABDOMEN - 1 VIEW COMPARISON:  10/08/2017 FINDINGS: Feeding tube tip is in the transverse duodenum. Right lower quadrant ostomy noted. No current bowel distention. No free air organomegaly. NG tube remains in the stomach. IVC filter in stable position. IMPRESSION: Decreasing bowel distension. No current bowel distention to suggest bowel obstruction. Feeding tube tip in the transverse duodenum. Electronically Signed   By: Charlett Nose M.D.   On: 10/13/2017 18:25   Dg Abd 1 View  Result Date: 10/10/2017 CLINICAL DATA:  27 year old male requiring feeding tube for nutrition. Fluoroscopic advancement of intragastric feeding tube requested. EXAM: ABDOMEN - 1 VIEW COMPARISON:  10/08/2017. FLUOROSCOPY TIME:  2 minutes 24 seconds FINDINGS: A single  fluoroscopic image demonstrates feeding tube tracking from the stomach into the proximal duodenum. A small volume of contrast injected opacifies the proximal duodenum. Superimposed NG type enteric tube in the stomach. IVC filter in place. Abdominal skin staples redemonstrated. IMPRESSION: Fluoroscopic advancement of feeding tube tip to the proximal duodenum. Electronically Signed   By: Odessa Fleming M.D.   On: 10/10/2017 11:08   Dg Abd 1 View  Result Date: 10/08/2017 CLINICAL DATA:  NG tube placement, bowel obstruction EXAM: ABDOMEN - 1 VIEW COMPARISON:  10/01/2017, 09/27/2017 FINDINGS: NG tube within the proximal stomach. Feeding tube coiled in the stomach fundus as well. Diffuse gaseous distention of the bowel. Ostomy in the right lower quadrant. Bowel gas pattern remains nonspecific for mild obstruction versus ileus. Retained stool in the residual rectum. Midline staples noted. IVC filter present. Surgical drain in  the pelvis. IMPRESSION: Stable bowel gas distension remaining nonspecific for mild obstruction versus ileus. NG tube in the proximal stomach. Electronically Signed   By: Judie Petit.  Shick M.D.   On: 10/08/2017 09:27   Dg Abdomen 1 View  Result Date: 09/21/2017 CLINICAL DATA:  Megacolon with little bowel movement while in hospital. EXAM: ABDOMEN - 1 VIEW COMPARISON:  Gastrografin enema images from 09/18/2017 and CT from 09/15/2017 FINDINGS: Significant stool retention throughout the visualized colon compatible with severe constipation and obstipation. Free air identified. No acute osseous appearing abnormality limited by overlying stool filled bowel. IMPRESSION: Significant stool retention throughout the colon. No significant change. Electronically Signed   By: Tollie Eth M.D.   On: 09/21/2017 23:22   Ct Angio Chest Pe W Or Wo Contrast  Result Date: 09/22/2017 CLINICAL DATA:  Recent admission for megacolon. Now unable to eat solids with severely distended abdomen. Shortness of breath. EXAM: CT ANGIOGRAPHY  CHEST CT ABDOMEN AND PELVIS WITH CONTRAST TECHNIQUE: Multidetector CT imaging of the chest was performed using the standard protocol during bolus administration of intravenous contrast. Multiplanar CT image reconstructions and MIPs were obtained to evaluate the vascular anatomy. Multidetector CT imaging of the abdomen and pelvis was performed using the standard protocol during bolus administration of intravenous contrast. CONTRAST:  ISOVUE-370 IOPAMIDOL (ISOVUE-370) INJECTION 76% COMPARISON:  CT abdomen and pelvis 09/15/2017. FINDINGS: CTA CHEST FINDINGS Cardiovascular: Motion artifact limits examination. There is good opacification of the central and segmental pulmonary arteries. Filling defects are demonstrated in the distal main and lower lobe pulmonary arteries bilaterally consistent with bilateral pulmonary emboli. Normal caliber thoracic aorta. Great vessel origins are patent. Normal heart size. Small pericardial effusion. Normal RV to LV ratio at 0.8. Mediastinum/Nodes: No significant lymphadenopathy. Esophagus is decompressed. Lungs/Pleura: Emphysematous changes and fibrosis in both lungs. Fluid or thickened pleura in the fissures. Patchy areas of infiltration or consolidation in both lungs could represent pneumonia or infarcts. No pleural effusions. No pneumothorax. Airways are patent. Musculoskeletal: No chest wall abnormality. No acute or significant osseous findings. Review of the MIP images confirms the above findings. CT ABDOMEN and PELVIS FINDINGS Hepatobiliary: No focal liver lesions. Gallbladder is mildly dilated with probable sludge. No wall thickening. Bile ducts are not dilated. Pancreas: Unremarkable. No pancreatic ductal dilatation or surrounding inflammatory changes. Spleen: Normal in size without focal abnormality. Adrenals/Urinary Tract: Adrenal glands are unremarkable. Kidneys are normal, without renal calculi, focal lesion, or hydronephrosis. Bladder is thick-walled, suggesting  cystitis. Stomach/Bowel: There is massive distention of the colon throughout. The colon is filled with gas and stool. Large amount of stool in the rectum. Rectal diameter measures up to 17 cm. Rectal wall thickening may indicate stercoral colitis. Small bowel and stomach are decompressed. Vascular/Lymphatic: No significant vascular findings are present. No enlarged abdominal or pelvic lymph nodes. Prominent bilateral groin lymph nodes of nonspecific etiology, probably reactive. Reproductive: Prostate is unremarkable. Other: No free air or free fluid in the abdomen. Edema in the subcutaneous fat. Musculoskeletal: No acute or significant osseous findings. Review of the MIP images confirms the above findings. IMPRESSION: 1. Positive examination for bilateral pulmonary emboli. No evidence of right heart strain. 2. Diffuse emphysematous changes in the lungs. Patchy airspace disease may represent pneumonia or infarcts. 3. Massive distention of the colon throughout. Colon is filled with gas and mostly stool. Large amount of stool in the rectum with rectal wall thickening may indicate stercoral colitis. Emphysema (ICD10-J43.9). These results were called by telephone at the time of interpretation on 09/22/2017  at 1:07 am to Dr. Cheron Schaumann , who verbally acknowledged these results. Electronically Signed   By: Burman Nieves M.D.   On: 09/22/2017 01:07   Ct Abdomen Pelvis W Contrast  Result Date: 10/01/2017 CLINICAL DATA:  Post subtotal colectomy with ileostomy. Blood out of JP drain. EXAM: CT ABDOMEN AND PELVIS WITH CONTRAST TECHNIQUE: Multidetector CT imaging of the abdomen and pelvis was performed using the standard protocol following bolus administration of intravenous contrast. CONTRAST:  OMNIPAQUE IOHEXOL 300 MG/ML  SOLN COMPARISON:  09/22/2017 and 09/15/2016 FINDINGS: Lower chest: Patchy airspace consolidation over the right lower lobe which may be due to atelectasis or infection. Small right pleural  effusion. Linear density over the left lower lobe likely atelectasis. Mild patchy peripheral opacification over the lingula and left lower lobe which may be due to atelectasis or infection. Hepatobiliary: Gallbladder somewhat contracted. Liver and biliary tree are within normal. Pancreas: Normal. Spleen: Normal. Adrenals/Urinary Tract: Adrenal glands are normal. Kidneys are normal in size without hydronephrosis or nephrolithiasis. Foley catheter is present within the bladder which is somewhat decompressed. Stomach/Bowel: Nasogastric tube has tip just below the gastroesophageal junction. Border of the stomach is difficult to accurately identified. Patient has undergone interval subtotal colectomy with ileostomy. Ileostomy site over the right lower quadrant is unremarkable. There are several fluid-filled mildly dilated small bowel loops over the upper abdomen likely postoperative ileus. Several small bowel loops have mild wall thickening and mucosal enhancement likely postoperative although can be seen due to regional enteritis of infectious or inflammatory nature. No definite free peritoneal air. Is difficult to follow the course of the remaining colon. There is moderate fecal retention over the rectum with mild rectal wall thickening with significant improvement compared to the previous exam. Vascular/Lymphatic: Within normal. Reproductive: Within normal. Other: No definite intra-abdominal abscess. Mild diffuse subcutaneous edema over the lower abdomen/pelvis. Skin staples vertically over the midline abdomen. There is a percutaneous drain entering the left anterior lower abdomen with tip over the right lower quadrant. Musculoskeletal: Unchanged. IMPRESSION: Evidence of patient's recent subtotal colectomy and ileostomy. Ileostomy site is unremarkable. Several fluid-filled mildly dilated small bowel loops over the upper abdomen with mucosal enhancement and mild wall thickening. Findings are likely postoperative,  although regional enteritis of infectious or inflammatory nature could produce similar findings. Percutaneous drain with tip over the right lower quadrant. Persistent mild rectal wall thickening with significantly improved fecal retention over the rectum. Worsening patchy bilateral airspace opacification within the lung bases right worse than left which may be due to atelectasis or infection. Small right pleural effusion. Enteric tube with tip just below the gastroesophageal junction. Electronically Signed   By: Elberta Fortis M.D.   On: 10/01/2017 16:02   Ct Abdomen Pelvis W Contrast  Result Date: 09/22/2017 CLINICAL DATA:  Recent admission for megacolon. Now unable to eat solids with severely distended abdomen. Shortness of breath. EXAM: CT ANGIOGRAPHY CHEST CT ABDOMEN AND PELVIS WITH CONTRAST TECHNIQUE: Multidetector CT imaging of the chest was performed using the standard protocol during bolus administration of intravenous contrast. Multiplanar CT image reconstructions and MIPs were obtained to evaluate the vascular anatomy. Multidetector CT imaging of the abdomen and pelvis was performed using the standard protocol during bolus administration of intravenous contrast. CONTRAST:  ISOVUE-370 IOPAMIDOL (ISOVUE-370) INJECTION 76% COMPARISON:  CT abdomen and pelvis 09/15/2017. FINDINGS: CTA CHEST FINDINGS Cardiovascular: Motion artifact limits examination. There is good opacification of the central and segmental pulmonary arteries. Filling defects are demonstrated in the distal main and  lower lobe pulmonary arteries bilaterally consistent with bilateral pulmonary emboli. Normal caliber thoracic aorta. Great vessel origins are patent. Normal heart size. Small pericardial effusion. Normal RV to LV ratio at 0.8. Mediastinum/Nodes: No significant lymphadenopathy. Esophagus is decompressed. Lungs/Pleura: Emphysematous changes and fibrosis in both lungs. Fluid or thickened pleura in the fissures. Patchy areas of  infiltration or consolidation in both lungs could represent pneumonia or infarcts. No pleural effusions. No pneumothorax. Airways are patent. Musculoskeletal: No chest wall abnormality. No acute or significant osseous findings. Review of the MIP images confirms the above findings. CT ABDOMEN and PELVIS FINDINGS Hepatobiliary: No focal liver lesions. Gallbladder is mildly dilated with probable sludge. No wall thickening. Bile ducts are not dilated. Pancreas: Unremarkable. No pancreatic ductal dilatation or surrounding inflammatory changes. Spleen: Normal in size without focal abnormality. Adrenals/Urinary Tract: Adrenal glands are unremarkable. Kidneys are normal, without renal calculi, focal lesion, or hydronephrosis. Bladder is thick-walled, suggesting cystitis. Stomach/Bowel: There is massive distention of the colon throughout. The colon is filled with gas and stool. Large amount of stool in the rectum. Rectal diameter measures up to 17 cm. Rectal wall thickening may indicate stercoral colitis. Small bowel and stomach are decompressed. Vascular/Lymphatic: No significant vascular findings are present. No enlarged abdominal or pelvic lymph nodes. Prominent bilateral groin lymph nodes of nonspecific etiology, probably reactive. Reproductive: Prostate is unremarkable. Other: No free air or free fluid in the abdomen. Edema in the subcutaneous fat. Musculoskeletal: No acute or significant osseous findings. Review of the MIP images confirms the above findings. IMPRESSION: 1. Positive examination for bilateral pulmonary emboli. No evidence of right heart strain. 2. Diffuse emphysematous changes in the lungs. Patchy airspace disease may represent pneumonia or infarcts. 3. Massive distention of the colon throughout. Colon is filled with gas and mostly stool. Large amount of stool in the rectum with rectal wall thickening may indicate stercoral colitis. Emphysema (ICD10-J43.9). These results were called by telephone at the  time of interpretation on 09/22/2017 at 1:07 am to Dr. Cheron Schaumann , who verbally acknowledged these results. Electronically Signed   By: Burman Nieves M.D.   On: 09/22/2017 01:07   Ir Gastrostomy Tube Mod Sed  Result Date: 10/17/2017 INDICATION: 27 year old male with dysphagia. EXAM: IMAGE GUIDED PERCUTANEOUS GASTROSTOMY MEDICATIONS: 2.0 g Ancef; Antibiotics were administered within 1 hour of the procedure. ANESTHESIA/SEDATION: Versed 2.0 mg IV; Fentanyl 125 mcg IV Moderate Sedation Time:  10 minutes The patient was continuously monitored during the procedure by the interventional radiology nurse under my direct supervision. CONTRAST:  20 cc-administered into the gastric lumen. FLUOROSCOPY TIME:  Fluoroscopy Time: 1 minutes 18 seconds (13 mGy). COMPLICATIONS: None PROCEDURE: Informed written consent was obtained from the patient's family after a thorough discussion of the procedural risks, benefits and alternatives. All questions were addressed. Maximal Sterile Barrier Technique was utilized including caps, mask, sterile gowns, sterile gloves, sterile drape, hand hygiene and skin antiseptic. A timeout was performed prior to the initiation of the procedure. The procedure, risks, benefits, and alternatives were explained to the patient. Questions regarding the procedure were encouraged and answered. The patient understands and consents to the procedure. The epigastrium was prepped with Betadine in a sterile fashion, and a sterile drape was applied covering the operative field. A sterile gown and sterile gloves were used for the procedure. A 5-French orogastric tube is placed under fluoroscopic guidance. Scout imaging of the abdomen confirms barium within the transverse colon. The stomach was distended with gas. Under fluoroscopic guidance, an 18 gauge needle was  utilized to puncture the anterior wall of the body of the stomach. Note that the needle was immediately adjacent to the inferior costal angle, and the  gentle right to left needle angle was required for adequate puncture of the anterior stomach. An Amplatz wire was advanced through the needle passing a T fastener into the lumen of the stomach. The T fastener was secured for gastropexy. A 9-French sheath was inserted. A snare was advanced through the 9-French sheath. A Teena Dunk was advanced through the orogastric tube. It was snared then pulled out the oral cavity, pulling the snare, as well. The leading edge of the gastrostomy was attached to the snare. It was then pulled down the esophagus and out the percutaneous site. It was secured in place. Contrast was injected. No complication IMPRESSION: Status post percutaneous gastrostomy placement. Signed, Yvone Neu. Reyne Dumas, RPVI [facility] [facility] Radiology Electronically Signed   By: Gilmer Mor D.O.   On: 10/17/2017 16:07   Ir Ivc Filter Plmt / S&i /img Guid/mod Sed  Result Date: 10/02/2017 INDICATION: History of pulmonary embolism, initiated on anti coagulation, now with uncontrolled bleeding associated with a surgical drain. As such, request made for placement an IVC filter for caval interruption purposes. Given the patient's multiple medical comorbidities, the filter will be considered a permanent device and the patient will not be actively followed by the interventional radiology service for retrieval. EXAM: ULTRASOUND GUIDANCE FOR VASCULAR ACCESS IVC CATHETERIZATION AND VENOGRAM IVC FILTER INSERTION COMPARISON:  CT abdomen and pelvis - 09/21/2017; chest CT - 09/22/2017 MEDICATIONS: None. ANESTHESIA/SEDATION: Fentanyl 100 mcg IV; Versed 12 mg IV Sedation Time: 12 minutes; The patient was continuously monitored during the procedure by the interventional radiology nurse under my direct supervision. CONTRAST:  40 cc Isovue-300 FLUOROSCOPY TIME:  1 minute 6 seconds (46.6 mGy) COMPLICATIONS: None immediate PROCEDURE: Informed consent was obtained from the patient's family  following explanation of the procedure, risks, benefits and alternatives. The patient understands, agrees and consents for the procedure. All questions were addressed. A time out was performed prior to the initiation of the procedure. Maximal barrier sterile technique utilized including caps, mask, sterile gowns, sterile gloves, large sterile drape, hand hygiene, and Betadine prep. Under sterile condition and local anesthesia, right internal jugular venous access was performed with ultrasound. An ultrasound image was saved and sent to PACS. Over a guidewire, the IVC filter delivery sheath and inner dilator were advanced into the IVC just above the IVC bifurcation. Contrast injection was performed for an IVC venogram. Through the delivery sheath, a retrievable Denali IVC filter was deployed below the level of the renal veins and above the IVC bifurcation. Limited post deployment venacavagram was performed. The delivery sheath was removed and hemostasis was obtained with manual compression. A dressing was placed. The patient tolerated the procedure well without immediate post procedural complication. FINDINGS: The IVC is patent. No evidence of thrombus, stenosis, or occlusion. No variant venous anatomy. Successful placement of the IVC filter below the level of the renal veins. IMPRESSION: Successful ultrasound and fluoroscopically guided placement of an infrarenal retrievable IVC filter via right jugular approach. PLAN: Due to patient related comorbidities and/or clinical necessity, this IVC filter should be considered a permanent device. This patient will not be actively followed for future filter retrieval. Electronically Signed   By: Simonne Come M.D.   On: 10/02/2017 13:16   Ir Adele Schilder [place]gina Organ Per W/fl Mod Sed  Result Date: 10/19/2017 INDICATION: History of gastric atony, post  percutaneous gastrostomy tube placement on 10/17/2017 Request now made for conversion of the existing pull-through  gastrostomy to a gastrojejunostomy catheter for post pyloric nutrition supplementation. EXAM: FLUOROSCOPIC GUIDED CONVERSION OF GASTROSTOMY TO A GASTROJEJUNOSTOMY TUBE COMPARISON:  Image guided gastrostomy tube placement - 10/17/2017 MEDICATIONS: None ANESTHESIA/SEDATION: Moderate (conscious) sedation was employed during this procedure. A total of Versed 0.5 mg and Fentanyl 50 mcg was administered intravenously. Moderate Sedation Time: 13 minutes. The patient's level of consciousness and vital signs were monitored continuously by radiology nursing throughout the procedure under my direct supervision. CONTRAST:  20 cc Isovue-300, administered into the gastric lumen and proximal small bowel FLUOROSCOPY TIME:  3 minutes, 12 seconds (15 mGy) COMPLICATIONS: None. PROCEDURE: Informed written consent was obtained from the patient after a discussion of the risks and benefits. The upper abdomen and the external portion of the existing gastrostomy tube was prepped and draped in the usual sterile fashion, and a sterile drape was applied covering the operative field. Maximum barrier sterile technique with sterile gowns and gloves were used for the procedure. A timeout was performed prior to the initiation of the procedure. The skin surrounding the existing pull-through gastrostomy tube was anesthetized with 1% lidocaine with epinephrine. Small amount of contrast was injected via the existing gastrostomy tube. The external portion of the pull-through gastrostomy tube with trimmed and a gastrostomy tube was cannulated with a Kumpe the catheter. With the use of a stiff Glidewire, the Kumpe catheter was advanced through the stomach into the proximal small bowel. Contrast injection confirmed appropriate positioning. Next, under intermittent fluoroscopic guidance, the existing Kumpe the catheter was exchanged for a new coaxial 9-French gastrojejunostomy catheter which was advanced through the pull-through gastrostomy tube with tip  ultimately terminating within the proximal small bowel. Contrast injection via the jejunostomy and gastric lumens confirmed appropriate functioning and positioning. Next, under intermittent fluoroscopic guidance, the existing nasogastric tube was removed without displacement of the existing gastrojejunostomy catheter. A dressing was placed. The patient tolerated procedure well without immediate postprocedural complication. IMPRESSION: Successful fluoroscopic guided conversion of existing pull-through gastrostomy tube to a gastrojejunostomy catheter. The tip of the gastrostomy port lies within the gastric lumen and the tip of the jejunostomy lumen lies within the proximal jejunum. Both lumens ready for immediate use. Electronically Signed   By: Simonne Come M.D.   On: 10/19/2017 13:38   Dg Chest Port 1 View  Result Date: 10/11/2017 CLINICAL DATA:  Acute respiratory failure.  Hypoxia. EXAM: PORTABLE CHEST 1 VIEW COMPARISON:  10/10/2017. FINDINGS: Tracheostomy tube, feeding tube, NG tube, right PICC line in unchanged position. Heart size normal. Right base atelectatic changes and consolidation again noted. Atelectasis. No pleural effusion or pneumothorax. IMPRESSION: 1.  Lines and tubes stable position. 2. Right base atelectatic changes and consolidation again noted. Continued follow-up exam to demonstrate resolution. Electronically Signed   By: Maisie Fus  Register   On: 10/11/2017 10:06   Dg Chest Port 1 View  Result Date: 10/10/2017 CLINICAL DATA:  Respiratory failure and shortness of breath. EXAM: PORTABLE CHEST 1 VIEW COMPARISON:  10/16/2016. FINDINGS: Nasogastric and feeding tubes extend beyond the inferior aspect of the film. right-sided PICC line terminates at low SVC. Tracheostomy appropriately position. Normal heart size. Possible small right pleural effusion. No pneumothorax. Clear left lung. Interval development of collapse/consolidative change in the right lower lobe with volume loss. IMPRESSION: Right  lower lobe collapse/consolidative change. Given volume loss, at least partially felt to represent atelectasis. Infection or aspiration cannot be excluded. Electronically Signed  By: Jeronimo Greaves M.D.   On: 10/10/2017 07:38   Dg Chest Port 1 View  Result Date: 10/07/2017 CLINICAL DATA:  Respiratory failure EXAM: PORTABLE CHEST 1 VIEW COMPARISON:  10/07/2016 FINDINGS: Tracheostomy tube and feeding catheter are again noted and stable. Right-sided PICC line is noted at the cavoatrial junction. Cardiac shadow is within normal limits. The lungs are well aerated bilaterally. Improved aeration in the right base is noted although mild residual atelectasis remains. IMPRESSION: Improving aeration in the right base. The remainder of the exam is stable from the previous day. Electronically Signed   By: Alcide Clever M.D.   On: 10/07/2017 07:13   Dg Chest Port 1 View  Result Date: 10/06/2017 CLINICAL DATA:  Respiratory failure. EXAM: PORTABLE CHEST 1 VIEW COMPARISON:  10/05/2017. FINDINGS: Tracheostomy tube, feeding tube, right PICC line in stable position. Heart size normal. Persistent but improved right base atelectasis. No pleural effusion or pneumothorax. IMPRESSION: 1.  Lines and tubes in stable position. 2.  Persistent but improved right base atelectasis. Electronically Signed   By: Maisie Fus  Register   On: 10/06/2017 09:30   Dg Chest Port 1 View  Result Date: 10/05/2017 CLINICAL DATA:  Status post bronchoscopy. EXAM: PORTABLE CHEST 1 VIEW COMPARISON:  Chest radiograph October 05, 2017 FINDINGS: Tracheostomy tube tip projects 5.8 cm above the carina. RIGHT PICC distal tip projects in distal superior vena cava. Feeding tube past distal stomach, Multiple EKG lines overlie the patient and may obscure subtle underlying pathology. RIGHT middle lobe collapse with similarly elevated RIGHT hemidiaphragm. Mild RIGHT upper lobe atelectasis. Hyper inflated LEFT lung. No focal consolidation. No pneumothorax. Midline  laparotomy staples. IMPRESSION: 1. New RIGHT middle lobe collapse. Improved aeration RIGHT upper lobe with mild atelectasis. 2. New feeding tube past distal stomach, otherwise stable appearance of life support lines. Electronically Signed   By: Awilda Metro M.D.   On: 10/05/2017 16:37   Dg Chest Port 1 View  Result Date: 10/05/2017 CLINICAL DATA:  New tracheostomy. History of cardiomyopathy and polysubstance abuse. EXAM: PORTABLE CHEST 1 VIEW COMPARISON:  10/05/2017 and 10/04/2017. FINDINGS: 1359 hours. Interval tracheostomy. The tracheostomy tube appears well positioned. Enteric tube projects below the diaphragm, tip in proximal stomach. There is a right arm PICC which projects to the level of the upper right atrium, stable. There is new right upper lobe collapse with associated volume loss. The left lung is clear. There is no pneumothorax or significant pleural effusion. The heart size and mediastinal contours are stable. IMPRESSION: 1. Tracheostomy appears well positioned. The additional components of the support system are stable. 2. New right upper lobe collapse.  No pneumothorax. Electronically Signed   By: Carey Bullocks M.D.   On: 10/05/2017 14:33   Dg Chest Port 1 View  Result Date: 10/05/2017 CLINICAL DATA:  Hypoxia EXAM: PORTABLE CHEST 1 VIEW COMPARISON:  October 04, 2017 FINDINGS: Endotracheal tube tip is 3.7 cm above the carina. Central catheter tip is at the cavoatrial junction. Nasogastric tube tip is in the gastric cardia region. Side port is above the diaphragm. No pneumothorax. There is mild medial right base atelectasis. No edema or consolidation. Heart size and pulmonary vascular normal. No adenopathy. No bone lesions. IMPRESSION: Tube and catheter positions as described without pneumothorax. Note that the nasogastric tube side port is above the diaphragm. Advise advancing nasogastric tube approximately the 8 cm to insure placement of both tube tip and side port below the  diaphragm. Mild medial right base atelectasis. No edema or consolidation. Stable  cardiac silhouette. Electronically Signed   By: Bretta Bang III M.D.   On: 10/05/2017 07:23   Dg Chest Port 1 View  Result Date: 10/04/2017 CLINICAL DATA:  Respiratory failure EXAM: PORTABLE CHEST 1 VIEW COMPARISON:  Two days ago FINDINGS: Endotracheal tube tip between the clavicular heads and carina. An orogastric tube tip reaches the GE junction. Side port is at the lower esophagus. Right upper extremity PICC with tip at the upper cavoatrial junction. The lungs are clear. Normal heart size IMPRESSION: 1. No evidence of active cardiopulmonary disease. 2. Orogastric tube tip at the GE junction and side-port at the lower esophagus. The stomach is moderately distended by gas. Electronically Signed   By: Marnee Spring M.D.   On: 10/04/2017 09:06   Dg Chest Port 1 View  Result Date: 10/02/2017 CLINICAL DATA:  Acute respiratory failure with hypoxia EXAM: PORTABLE CHEST 1 VIEW COMPARISON:  Chest radiograph dated 10/01/2017. CTA chest dated 09/22/2017. FINDINGS: Right pleural effusion has improved/resolved. Focal patchy opacity in the lateral right lower lung, corresponding to the inferior right upper lobe on CT, where this appearance may suggest sequela of pulmonary infarct. Left lung is clear. No frank interstitial edema. No pneumothorax. The heart is normal in size. Endotracheal tube terminates 4.5 cm above the carina. Right arm PICC terminates in the upper right atrium. Enteric tube courses into the gastric cardia with its side port at the GE junction. IMPRESSION: Endotracheal tube terminates 4.5 cm above the carina. Additional support apparatus as above. Right pleural effusion is improved/resolved. Focal patchy opacity in the lateral right lower lung, corresponding to the inferior right upper lobe on CT, where this appearance may suggest sequela of pulmonary infarct. Follow-up is suggested to document resolution.  Electronically Signed   By: Charline Bills M.D.   On: 10/02/2017 07:42   Portable Chest Xray  Result Date: 10/01/2017 CLINICAL DATA:  27 year old male status post endotracheal tube placement. EXAM: PORTABLE CHEST 1 VIEW COMPARISON:  Chest x-ray a 09/30/2017. FINDINGS: There is a right upper extremity PICC with tip terminating in the right atrium. An endotracheal tube is in place with tip 3.6 cm above the carina. A nasogastric tube is seen extending into the stomach, however, the tip of the nasogastric tube extends below the lower margin of the image. Airspace consolidation in the right middle and lower lobes. Small right pleural effusion. Probable subsegmental atelectasis in the medial aspect of the left lower lobe. No definite left pleural effusion. No evidence of pulmonary edema. Heart size is normal. Upper mediastinal contours are within normal limits. IMPRESSION: 1. Support apparatus, as above. 2. Worsening airspace consolidation in the right middle and lower lobes concerning for pneumonia. Small right parapneumonic pleural effusion. Electronically Signed   By: Trudie Reed M.D.   On: 10/01/2017 07:16   Dg Chest Port 1 View  Result Date: 09/30/2017 CLINICAL DATA:  Intubation.  Cardiomyopathy. EXAM: PORTABLE CHEST 1 VIEW COMPARISON:  09/29/2017 FINDINGS: Endotracheal tube in satisfactory position. Enteric catheter has been partially retracted and the side hole is superior to the expected location of the GE junction. Right PICC line in stable position. Cardiomediastinal silhouette is normal. Mediastinal contours appear intact. No evidence of pneumothorax. Linear bilateral airspace opacities in the lower lobes. Improved right pleural effusion. Osseous structures are without acute abnormality. Soft tissues are grossly normal. IMPRESSION: Status post intubation. Enteric catheter has been partially retracted and the side hole is superior to the expected location of the GE junction. Readjustment  recommended. Bilateral linear lower  lobe airspace opacities may represent atelectasis or consolidation. Improved right pleural effusion. Electronically Signed   By: Ted Mcalpine M.D.   On: 09/30/2017 11:06   Dg Chest Port 1 View  Result Date: 09/29/2017 CLINICAL DATA:  History of cardiomyopathy EXAM: PORTABLE CHEST 1 VIEW COMPARISON:  09/29/2017, 09/28/2017, 09/21/2017, CT 09/22/2017 FINDINGS: Esophageal tube tip appears folded back upon itself in the region of the stomach. Endotracheal tube is removed. Right upper extremity catheter tip overlies the right atrium. Increased right pleural effusion and basilar airspace disease. Stable cardiomediastinal silhouette. No pneumothorax. IMPRESSION: 1. Removal of endotracheal tube. Esophageal tube tip appears folded back upon itself in the region of the proximal stomach 2. Development of small right pleural effusion with worsened right basilar consolidation and mild volume loss/shift to the right Electronically Signed   By: Jasmine Pang M.D.   On: 09/29/2017 22:06   Dg Chest Port 1 View  Result Date: 09/29/2017 CLINICAL DATA:  Acute respiratory failure, hypoxia EXAM: PORTABLE CHEST 1 VIEW COMPARISON:  Chest x-ray of 09/28/2017 and CT chest of 09/22/2017 FINDINGS: The tip of the endotracheal tube is approximately 3.8 cm above the carina. Right PICC line tip overlies the expected right atrium. NG tube extends into the stomach. There is mild linear atelectasis at the lung bases, and minimal opacity in the medial left upper lung possibly residual of patchy opacities noted by CT chest. Pneumonia cannot be excluded. No effusion is seen. Mild cardiomegaly is stable. IMPRESSION: 1. Tip of endotracheal tube approximately 3.8 cm above the carina. 2. Minimal opacity medially in the left upper lobe may be residual of prior patchy opacities noted by CT of the chest. Possible area of pneumonia. 3. Mild basilar atelectasis. Electronically Signed   By: Dwyane Dee M.D.   On:  09/29/2017 09:50   Portable Chest Xray  Result Date: 09/28/2017 CLINICAL DATA:  Hypoxia EXAM: PORTABLE CHEST 1 VIEW COMPARISON:  September 27, 2017 FINDINGS: Endotracheal tube tip is 3.7 cm above the carina. Nasogastric tube tip and side port are in stomach. No pneumothorax. There is atelectatic change in the left base. There is subtle airspace opacity in the right base, slightly less than 1 day prior. No new opacity evident. Heart is borderline prominent with pulmonary vascularity normal. No adenopathy. No bone lesions. IMPRESSION: Tube positions as described without evident pneumothorax. Partial clearing focal infiltrate right base. Atelectatic change left base. No new opacity. Stable cardiac silhouette. Electronically Signed   By: Bretta Bang III M.D.   On: 09/28/2017 07:25   Portable Chest X-ray  Result Date: 09/27/2017 CLINICAL DATA:  27 year old male currently intubated EXAM: PORTABLE CHEST 1 VIEW COMPARISON:  Chest x-ray obtained earlier today FINDINGS: The tip of the endotracheal tube is 4.1 cm above the carina. A nasogastric tube is present. The tip overlies the gastric fundus. Patchy bibasilar airspace opacities more prominent on the right than the left. Overall, inspiratory volumes are improved. No evidence of pneumothorax or large pleural effusion. No acute osseous abnormality. IMPRESSION: 1. Patchy airspace opacities in the right greater than left base which may reflect atelectasis or multifocal pneumonia. 2. Interval intubation. The tip of the endotracheal tube is 4.1 cm above the carina. 3. Improved lung volumes bilaterally following intubation. 4. Well-positioned gastric tube. Electronically Signed   By: Malachy Moan M.D.   On: 09/27/2017 15:40   Dg Chest Portable 1 View  Result Date: 09/21/2017 CLINICAL DATA:  Shortness of breath EXAM: PORTABLE CHEST 1 VIEW COMPARISON:  08/18/2017 08/27/2013 FINDINGS: Low  lung volumes. Subsegmental atelectasis left base. Increasing opacity at  the right base. Stable cardiomediastinal silhouette. No pneumothorax. IMPRESSION: Low lung volumes with atelectasis at the left base. Increasing airspace disease at the right base, atelectasis versus mild infiltrate. Electronically Signed   By: Jasmine Pang M.D.   On: 09/21/2017 23:28   Dg Abd Acute W/chest  Result Date: 09/27/2017 CLINICAL DATA:  Nausea and vomiting EXAM: DG ABDOMEN ACUTE W/ 1V CHEST COMPARISON:  Chest two-view 09/21/2017.  Abdomen 09/21/2017 FINDINGS: Bibasilar airspace disease unchanged. This may represent atelectasis or pneumonia. No effusion. Decreased lung volume. NG tube in the stomach. Very large amount of stool throughout the colon similar to 09/22/2014. Large amount of stool in the rectum. No bowel obstruction or free air. The colon is distended and filled with stool diffusely. IMPRESSION: Hypoventilation.  Bibasilar atelectasis/infiltrate unchanged Extremely large amount of stool throughout the colon. Colon diffusely distended similar to prior studies. Electronically Signed   By: Marlan Palau M.D.   On: 09/27/2017 07:34   [place] Ekg Site Rite  Result Date: 09/28/2017 If Site Rite image not attached, placement could not be confirmed due to current cardiac rhythm.  Dg Or Local Abdomen  Result Date: 09/27/2017 CLINICAL DATA:  Concern for retained surgical instrument EXAM: OR LOCAL ABDOMEN COMPARISON:  None. FINDINGS: Surgical drain present with tip in right lower quadrant. Skin staples present. No retained surgical instrument evident. There is an apparent right lower quadrant ostomy. There is no bowel dilatation or air-fluid level. There is free air consistent with surgery. IMPRESSION: No retained surgical instrument. Skin staples and surgical drain present. There is pneumoperitoneum. No bowel obstruction evident. Critical Value/emergent results were called by telephone at the time of interpretation on 09/27/2017 at 1:41 pm to Sherlyn Lick, RN, who verbally acknowledged these results.  Electronically Signed   By: Bretta Bang III M.D.   On: 09/27/2017 13:41      Subjective: Patient denies any nausea vomiting or abdominal pain.  Has tolerated decannulation.  Feels okay with breathing.  Denies overt pain.  Discharge Exam: Vitals:   10/21/17 0520 10/21/17 0910  BP: 105/72   Pulse: 96 96  Resp: 18 16  Temp:  97.6 F (36.4 C)  SpO2: 98% 98%   Vitals:   10/21/17 0430 10/21/17 0500 10/21/17 0520 10/21/17 0910  BP:   105/72   Pulse:   96 96  Resp:   18 16  Temp: (!) 97.5 F (36.4 C)   97.6 F (36.4 C)  TempSrc: Oral   Oral  SpO2:   98% 98%  Weight:  60.2 kg    Height:       General: Pt is alert, awake, not in acute distress, thinly built.  Status post decannulation Cardiovascular: RRR, mild tachycardia S1/S2 +, no rubs, no gallops Respiratory: CTA bilaterally, no wheezing, no rhonchi Abdominal: Soft, NT, ND, bowel sounds + J-tube in place.  Fully healed midline incision.  Ileostomy bag in place CNS: non focal. Extremities: no edema, no cyanosis  The results of significant diagnostics from this hospitalization (including imaging, microbiology, ancillary and laboratory) are listed below for reference.    Microbiology: No results found for this or any previous visit (from the past 240 hour(s)).   Labs: BNP (last 3 results) No results for input(s): BNP in the last 8760 hours. Basic Metabolic Panel: Recent Labs  Lab 10/17/17 0351 10/18/17 0500 10/19/17 0503 10/19/17 2018 10/20/17 0520 10/20/17 1641 10/21/17 0639  NA 134* 135 130*  --  129*  --  129*  K 3.5 4.7 3.5  --  3.1*  --  3.6  CL 89* 92* 81*  --  82*  --  82*  CO2 35* 32 35*  --  35*  --  32  GLUCOSE 98 313* 114*  --  127*  --  115*  BUN 28* 43* 45*  --  32*  --  25*  CREATININE 0.66 0.97 0.92  --  0.85  --  0.79  CALCIUM 9.7 9.8 10.8*  --  10.2  --  10.2  MG 1.9 2.0  --  2.1  --  1.7 1.8  PHOS 4.1  --   --  3.0 3.3 2.5 2.6   Liver Function Tests: Recent Labs  Lab 10/15/17 0434  10/17/17 0351 10/19/17 0503  AST 24 21 23   ALT 37 29 31  ALKPHOS 187* 180* 162*  BILITOT 0.5 0.6 0.8  PROT 8.1 7.7 8.2*  ALBUMIN 2.9* 3.1* 3.4*   No results for input(s): LIPASE, AMYLASE in the last 168 hours. No results for input(s): AMMONIA in the last 168 hours. CBC: Recent Labs  Lab 10/17/17 0351 10/18/17 0500 10/19/17 0503 10/20/17 0520 10/21/17 0639  WBC 8.3 14.8* 12.9* 10.3 8.1  HGB 15.0 15.4 15.8 15.0 14.7  HCT 46.5 48.0 49.0 46.0 44.4  MCV 88.2 88.1 87.0 86.8 86.2  PLT 379 465* 437* 294 275   Cardiac Enzymes: No results for input(s): CKTOTAL, CKMB, CKMBINDEX, TROPONINI in the last 168 hours. BNP: Invalid input(s): POCBNP CBG: Recent Labs  Lab 10/20/17 1646 10/20/17 1957 10/21/17 0053 10/21/17 0425 10/21/17 0841  GLUCAP 118* 125* 110* 110* 103*   D-Dimer No results for input(s): DDIMER in the last 72 hours. Hgb A1c No results for input(s): HGBA1C in the last 72 hours. Lipid Profile No results for input(s): CHOL, HDL, LDLCALC, TRIG, CHOLHDL, LDLDIRECT in the last 72 hours. Thyroid function studies No results for input(s): TSH, T4TOTAL, T3FREE, THYROIDAB in the last 72 hours.  Invalid input(s): FREET3 Anemia work up No results for input(s): VITAMINB12, FOLATE, FERRITIN, TIBC, IRON, RETICCTPCT in the last 72 hours. Urinalysis    Component Value Date/Time   COLORURINE STRAW (A) 09/12/2016 0326   APPEARANCEUR CLEAR 09/12/2016 0326   LABSPEC 1.005 09/12/2016 0326   PHURINE 7.0 09/12/2016 0326   GLUCOSEU NEGATIVE 09/12/2016 0326   HGBUR NEGATIVE 09/12/2016 0326   BILIRUBINUR NEGATIVE 09/12/2016 0326   KETONESUR NEGATIVE 09/12/2016 0326   PROTEINUR NEGATIVE 09/12/2016 0326   UROBILINOGEN 0.2 08/27/2013 2203   NITRITE NEGATIVE 09/12/2016 0326   LEUKOCYTESUR NEGATIVE 09/12/2016 0326   Sepsis Labs Invalid input(s): PROCALCITONIN,  WBC,  LACTICIDVEN Microbiology No results found for this or any previous visit (from the past 240 hour(s)).  Please  note: You were cared for by a hospitalist during your hospital stay. Once you are discharged, your primary care physician will handle any further medical issues. Please note that NO REFILLS for any discharge medications will be authorized once you are discharged, as it is imperative that you return to your primary care physician (or establish a relationship with a primary care physician if you do not have one) for your post hospital discharge needs so that they can reassess your need for medications and monitor your lab values.  Time coordinating discharge: 40 minutes  SIGNED:  Joycelyn Das, MD  [facility] 10/21/2017, 11:03 AM   If 7PM-7AM, please contact night-coverage [email or web address] Password [email or web address]

## 2017-10-21 NOTE — Plan of Care (Signed)
  Problem: Consults Goal: RH GENERAL PATIENT EDUCATION Description See Patient Education module for education specifics. Outcome: Not Progressing Goal: Skin Care Protocol Initiated - if Braden Score 18 or less Description If consults are not indicated, leave blank or document N/A Outcome: Not Progressing   Problem: RH BOWEL ELIMINATION Goal: RH STG MANAGE BOWEL WITH ASSISTANCE Description STG Manage Bowel with min Assistance.  Outcome: Not Progressing Goal: RH STG MANAGE BOWEL W/MEDICATION W/ASSISTANCE Description STG Manage Bowel with Medication with min Assistance.  Outcome: Not Progressing   Problem: RH BLADDER ELIMINATION Goal: RH STG MANAGE BLADDER WITH ASSISTANCE Description STG Manage Bladder With min Assistance  Outcome: Not Progressing Goal: RH STG MANAGE BLADDER WITH MEDICATION WITH ASSISTANCE Description STG Manage Bladder With Medication With min Assistance.  Outcome: Not Progressing Goal: RH STG MANAGE BLADDER WITH EQUIPMENT WITH ASSISTANCE Description STG Manage Bladder With Equipment With min  Assistance  Outcome: Not Progressing   Problem: RH SKIN INTEGRITY Goal: RH STG SKIN FREE OF INFECTION/BREAKDOWN Outcome: Not Progressing Goal: RH STG MAINTAIN SKIN INTEGRITY WITH ASSISTANCE Description STG Maintain Skin Integrity With min-mod Assistance.  Outcome: Not Progressing Goal: RH STG ABLE TO PERFORM INCISION/WOUND CARE W/ASSISTANCE Description STG Able To Perform Incision/Wound Care With min-mod Assistance.  Outcome: Not Progressing   Problem: RH SAFETY Goal: RH STG ADHERE TO SAFETY PRECAUTIONS W/ASSISTANCE/DEVICE Description STG Adhere to Safety Precautions With min-mod Assistance/Device.  Outcome: Not Progressing Goal: RH STG DECREASED RISK OF FALL WITH ASSISTANCE Description STG Decreased Risk of Fall With min-mod Assistance.  Outcome: Not Progressing   Problem: RH PAIN MANAGEMENT Goal: RH STG PAIN MANAGED AT OR BELOW [name] PAIN GOAL Outcome:  Not Progressing   Problem: RH KNOWLEDGE DEFICIT GENERAL Goal: RH STG INCREASE KNOWLEDGE OF SELF CARE AFTER HOSPITALIZATION Outcome: Not Progressing   Problem: RH Vision Goal: RH LTG Vision (Specify) Outcome: Not Progressing   Problem: RH Pre-functional/Other (Specify) Goal: RH LTG Pre-functional (Specify) Outcome: Not Progressing Goal: RH LTG Interdisciplinary (Specify) 1 Description RH LTG Interdisciplinary (Specify)1 Outcome: Not Progressing Goal: RH LTG Interdisciplinary (Specify) 2 Description RH LTG Interdisciplinary (Specify) 2  Outcome: Not Progressing  New admit

## 2017-10-21 NOTE — Progress Notes (Signed)
Patient ID: Philip Thompson, male   DOB: February 16, 1990, 27 y.o.   MRN: [id number]    24 Days Post-Op  Subjective: Patient sleeping.  Objective: Vital signs in last 24 hours: Temp:  [97.5 F (36.4 C)-98.6 F (37 C)] 97.5 F (36.4 C) (10/04 0430) Pulse Rate:  [88-102] 96 (10/04 0910) Resp:  [14-20] 16 (10/04 0910) BP: (95-109)/(60-88) 105/72 (10/04 0520) SpO2:  [96 %-100 %] 98 % (10/04 0910) Weight:  [60.2 kg] 60.2 kg (10/04 0500) Last BM Date: 10/21/17  Intake/Output from previous day: 10/03 0701 - 10/04 0700 In: 1937.7 [I.V.:3; NG/GT:1574.7] Out: 3600 [Urine:1550; Drains:200; Stool:1850] Intake/Output this shift: No intake/output data recorded.  PE: Abd: soft, NT, midline incision well healed.  Ileostomy with good output.  GJ tube in place.  g-tube hooked to gravity bag with minimal output currently.  J-tube hooked up to TFs at goal rate.  Lab Results:  Recent Labs    10/20/17 0520 10/21/17 0639  WBC 10.3 8.1  HGB 15.0 14.7  HCT 46.0 44.4  PLT 294 275   BMET Recent Labs    10/20/17 0520 10/21/17 0639  NA 129* 129*  K 3.1* 3.6  CL 82* 82*  CO2 35* 32  GLUCOSE 127* 115*  BUN 32* 25*  CREATININE 0.85 0.79  CALCIUM 10.2 10.2   PT/INR No results for input(s): LABPROT, INR in the last 72 hours. CMP     Component Value Date/Time   NA 129 (L) 10/21/2017 0639   K 3.6 10/21/2017 0639   CL 82 (L) 10/21/2017 0639   CO2 32 10/21/2017 0639   GLUCOSE 115 (H) 10/21/2017 0639   BUN 25 (H) 10/21/2017 0639   CREATININE 0.79 10/21/2017 0639   CALCIUM 10.2 10/21/2017 0639   PROT 8.2 (H) 10/19/2017 0503   ALBUMIN 3.4 (L) 10/19/2017 0503   AST 23 10/19/2017 0503   ALT 31 10/19/2017 0503   ALKPHOS 162 (H) 10/19/2017 0503   BILITOT 0.8 10/19/2017 0503   GFRNONAA >60 10/21/2017 0639   GFRAA >60 10/21/2017 0639   Lipase     Component Value Date/Time   LIPASE 37 09/21/2017 2237       Studies/Results: Ir Adele Schilder Tamsen Snider Convert Gastr-jej Per W/fl Mod Sed  Result  Date: 10/19/2017 INDICATION: History of gastric atony, post percutaneous gastrostomy tube placement on 10/17/2017 Request now made for conversion of the existing pull-through gastrostomy to a gastrojejunostomy catheter for post pyloric nutrition supplementation. EXAM: FLUOROSCOPIC GUIDED CONVERSION OF GASTROSTOMY TO A GASTROJEJUNOSTOMY TUBE COMPARISON:  Image guided gastrostomy tube placement - 10/17/2017 MEDICATIONS: None ANESTHESIA/SEDATION: Moderate (conscious) sedation was employed during this procedure. A total of Versed 0.5 mg and Fentanyl 50 mcg was administered intravenously. Moderate Sedation Time: 13 minutes. The patient's level of consciousness and vital signs were monitored continuously by radiology nursing throughout the procedure under my direct supervision. CONTRAST:  20 cc Isovue-300, administered into the gastric lumen and proximal small bowel FLUOROSCOPY TIME:  3 minutes, 12 seconds (15 mGy) COMPLICATIONS: None. PROCEDURE: Informed written consent was obtained from the patient after a discussion of the risks and benefits. The upper abdomen and the external portion of the existing gastrostomy tube was prepped and draped in the usual sterile fashion, and a sterile drape was applied covering the operative field. Maximum barrier sterile technique with sterile gowns and gloves were used for the procedure. A timeout was performed prior to the initiation of the procedure. The skin surrounding the existing pull-through gastrostomy tube was anesthetized with 1% lidocaine with epinephrine.  Small amount of contrast was injected via the existing gastrostomy tube. The external portion of the pull-through gastrostomy tube with trimmed and a gastrostomy tube was cannulated with a Kumpe the catheter. With the use of a stiff Glidewire, the Kumpe catheter was advanced through the stomach into the proximal small bowel. Contrast injection confirmed appropriate positioning. Next, under intermittent fluoroscopic  guidance, the existing Kumpe the catheter was exchanged for a new coaxial 9-French gastrojejunostomy catheter which was advanced through the pull-through gastrostomy tube with tip ultimately terminating within the proximal small bowel. Contrast injection via the jejunostomy and gastric lumens confirmed appropriate functioning and positioning. Next, under intermittent fluoroscopic guidance, the existing nasogastric tube was removed without displacement of the existing gastrojejunostomy catheter. A dressing was placed. The patient tolerated procedure well without immediate postprocedural complication. IMPRESSION: Successful fluoroscopic guided conversion of existing pull-through gastrostomy tube to a gastrojejunostomy catheter. The tip of the gastrostomy port lies within the gastric lumen and the tip of the jejunostomy lumen lies within the proximal jejunum. Both lumens ready for immediate use. Electronically Signed   By: Simonne Come M.D.   On: 10/19/2017 13:38    Anti-infectives: Anti-infectives (From admission, onward)   Start     Dose/Rate Route Frequency Ordered Stop   10/19/17 0800  ceFAZolin (ANCEF) IVPB 2g/100 mL premix     2 g 200 mL/hr over 30 Minutes Intravenous To Radiology 10/18/17 1052 10/19/17 0926   10/17/17 1700  ceFAZolin (ANCEF) IVPB 2g/100 mL premix     2 g 200 mL/hr over 30 Minutes Intravenous  Once 10/17/17 1608 10/17/17 1545   10/17/17 1615  ceFAZolin (ANCEF) IVPB 2g/100 mL premix  Status:  Discontinued     2 g 200 mL/hr over 30 Minutes Intravenous Every 8 hours 10/17/17 1606 10/17/17 1607   10/17/17 1510  ceFAZolin (ANCEF) 2-4 GM/100ML-% IVPB    Note to Pharmacy:  [name]   : cabinet override      10/17/17 1510 10/18/17 0314   10/09/17 1730  ceFAZolin (ANCEF) IVPB 2g/100 mL premix     2 g 200 mL/hr over 30 Minutes Intravenous Every 8 hours 10/09/17 1641 10/16/17 1745   10/08/17 0815  erythromycin ethylsuccinate (EES) 200 MG/5ML suspension 200 mg  Status:   Discontinued     200 mg Per Tube Every 6 hours 10/08/17 0807 10/09/17 1636   09/27/17 1000  piperacillin-tazobactam (ZOSYN) IVPB 3.375 g  Status:  Discontinued     3.375 g 12.5 mL/hr over 240 Minutes Intravenous Every 8 hours 09/27/17 0845 10/06/17 1504       Assessment/Plan S/P subtotal colectomy with ileostomy 9/10 Dr. Magnus Ivan-  -Gastric atony,g-tube to gravity. -s/p G to GJ conversion.   -cont to feed jtube through J-tube for now.  However, g-tube output seems to have dropped off over the last couple of days.   -no further surgical interventions warranted at this time.  He is stable surgically for DC to rehab -if his swallow is ok, he could have clear liquids for comfort as long as g-tube remains to gravity; however, if his g-tube output continues to remain low, then at some point this could be clamped and PO intake could be tried. PE- Lovenox Acute resp failure- decannulated Tachycardia/SVT- improved PT/OT- rec CIR  FEN - ice/g-tube for gravity/J tube for TFs VTE -Lovenox ID -none currently   LOS: 29 days    Letha Cape , [facility] 10/21/2017, 9:45 AM Pager: [phone]
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
## 2017-10-21 NOTE — Progress Notes (Signed)
Physical Medicine and Rehabilitation Consult   Reason for Consult: Debility Referring Physician: Dr. Molli Knock.    HPI: Philip Thompson is a 27 y.o. 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  History taken from chart review and mother. He was transferred to [facility] for treatment and CT scan done revealing bilateral PE and massive distention of the colon.  Dr. Evette Cristal consulted for input and recommended neostigmine trial with monitoring.  He was started on IV heparin for PE and has no improvement in colonic inertia, was taken to the OR for subtotal colectomy and ileostomy on zero 9/10 by Dr. Magnus Ivan.  He required intubation and sedation as well as NG tube due to high volume output.  IVC filter placed TPN started for nutritional support he has had issues with anxiety as well as agitation and difficulty with vent wean requiring tracheostomy on 9/18 by Dr. Molli Knock.  Anxiety levels improving and he has been weaned to ATC. He has been unable to tolerate tube feed trials and post pyloric panda placed by radiology yesterday. Therapy evaluations done and patient noted to be limited by tachycardia and debility.  CIR recommended due to functional deficits  Review of Systems  Unable to perform ROS: Patient nonverbal       Past Medical History:  Diagnosis Date  . ADD (attention deficit disorder)   . Aggression   . Allergic rhinitis   . Anxiety   . Arthritis    joint pain  . Cardiomyopathy (HCC)    a. EF <25% in 2015 during admission for multi substance intoxication/respiratory failure requiring intubation.  . Chronic back pain   . Constipation   . Depression   . Fecal obstruction (HCC)   . Frontal head injury   . Heroin overdose (HCC)   . Obstipation   . Polysubstance abuse (HCC)   . Suicide  attempt [facility])          Past Surgical History:  Procedure Laterality Date  . COLECTOMY N/A 09/27/2017   Procedure: SUBTOTAL COLECTOMY AND ILEOSTOMY;  Surgeon: Abigail Miyamoto, MD;  Location: [facility] OR;  Service: General;  Laterality: N/A;  . FOOT SURGERY     to get a BB out  . IR IVC FILTER PLMT / S&I /IMG GUID/MOD SED  10/02/2017  . NO PAST SURGERIES           Family History  Problem Relation Age of Onset  . Dementia Father   . Alcohol abuse Father   . Diabetes Brother   . Cancer - Ovarian Maternal Grandmother   . Cancer - Lung Paternal Grandmother   . Cancer Paternal Grandfather   . Diabetes Other   . Hypertension Other   . Heart disease Other   . Heart attack Other     Social History:  Lives with disabled father. Parents divorced by mother checks in on them daily. Mother works nights on weekends at [employer]. Per reports that he has been smoking cigarettes--one PPD.  He has a 7.00 pack-year smoking history. He has never used smokeless tobacco. He denies any alcohol use. He reports that he has current drug history. Drugs: Marijuana and Cocaine--"for pain" per mother.    Allergies: No Known Allergies          Medications Prior to Admission  Medication Sig Dispense Refill  . busPIRone (BUSPAR) 15 MG  tablet Take 1 tablet (15 mg total) by mouth 3 (three) times daily. 90 tablet 3  . citalopram (CELEXA) 40 MG tablet Take 1 tablet (40 mg total) by mouth daily. 90 tablet 0  . cyclobenzaprine (FLEXERIL) 10 MG tablet Take 1 tablet (10 mg total) by mouth 3 (three) times daily as needed for muscle spasms. 30 tablet 0  . divalproex (DEPAKOTE) 500 MG DR tablet Take 1 tablet (500 mg total) by mouth every 12 (twelve) hours. For mood stabilization 180 tablet 0  . docusate sodium (COLACE) 100 MG capsule Take 1 capsule (100 mg total) by mouth 2 (two) times daily. (Patient taking differently: Take 100 mg by mouth daily. ) 60 capsule 0  . furosemide (LASIX) 20 MG  tablet Take 1 tablet (20 mg total) by mouth daily. Prn leg swelling (Patient taking differently: Take 20 mg by mouth daily as needed for fluid or edema. As needed for leg swelling) 30 tablet 2  . gabapentin (NEURONTIN) 300 MG capsule Take 2 capsules (600 mg total) by mouth 3 (three) times daily. For agitation 540 capsule 0  . ibuprofen (ADVIL,MOTRIN) 600 MG tablet Take 1 tablet (600 mg total) by mouth every 6 (six) hours as needed for moderate pain. (May buy from over the counter): For pain 1 tablet 0  . levothyroxine (SYNTHROID) 50 MCG tablet Take 1 tablet (50 mcg total) by mouth daily before breakfast. 30 tablet 1  . linaclotide (LINZESS) 290 MCG CAPS capsule Take 1 capsule (290 mcg total) by mouth daily before breakfast. 30 capsule 1  . potassium chloride (K-DUR,KLOR-CON) 10 MEQ tablet Take 1 tablet (10 mEq total) by mouth daily. With furosemide (Patient taking differently: Take 10 mEq by mouth daily as needed (only takes when Lasix (Furosemide)). With furosemide) 30 tablet 2  . QUEtiapine (SEROQUEL) 400 MG tablet Take 2 tablets (800 mg total) by mouth at bedtime. 180 tablet 0  . traMADol (ULTRAM) 50 MG tablet Take 50 mg by mouth every 12 (twelve) hours as needed for moderate pain or severe pain.   0  . traZODone (DESYREL) 100 MG tablet Take 1 tablet (100 mg total) by mouth at bedtime. 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, industrial/product Status:    Mobility: Bed Mobility Overal bed mobility: Needs Assistance Bed Mobility: Supine to Sit Rolling: Max assist, +2 for physical assistance Supine to sit: Max assist General bed mobility comments: pt able to initiate LE movement toward EoB, requires maxA for trunk to upright and hip scoot toward EoB Transfers Overall transfer level: Needs assistance Equipment used: 2 person hand held assist Transfers: Sit to/from Stand Sit to Stand: Max assist, +2 physical assistance General transfer comment: maxAx2 for power up and steadying in standing Ambulation/Gait Ambulation/Gait assistance: Max assist, +2 physical assistance Gait Distance (Feet): 2 Feet Assistive device: 2 person hand held assist Gait Pattern/deviations: Step-to pattern, Shuffle General Gait Details: maxAx2 for stepping to recliner, vc for sequencing, increased time required Gait velocity: slowed Gait velocity interpretation: <1.31 ft/sec, indicative of household ambulator  ADL: ADL Overall ADL's :  Needs assistance/impaired Eating/Feeding: NPO Grooming: Wash/dry hands, Wash/dry face, Set up, Bed level Upper Body Bathing: Set up, Bed level Lower Body Bathing: Maximal assistance, Bed level Upper Body Dressing : Maximal assistance, Bed level Lower Body Dressing: Maximal assistance, Bed level Toilet Transfer: Total assistance Toilet Transfer Details (indicate cue type and reason): unable to safely attempt  Toileting- Clothing Manipulation and Hygiene: Total assistance, Bed level General ADL Comments: Pt limited by lines and tubes as well as HR 152-166   Cognition: Cognition Overall Cognitive Status: Impaired/Different from baseline Orientation Level: Intubated/Tracheostomy - Unable to assess Cognition Arousal/Alertness: Awake/alert Behavior During Therapy: Flat affect, WFL for tasks assessed/performed Overall Cognitive Status: Impaired/Different from baseline Area of Impairment: Attention, Memory, Following commands,  Awareness, Problem solving Current Attention Level: Sustained Memory: Decreased short-term memory Following Commands: Follows one step commands consistently Safety/Judgement: Decreased awareness of safety, Decreased awareness of deficits Awareness: Intellectual Problem Solving: Slow processing, Difficulty sequencing, Requires tactile cues, Requires verbal cues General Comments: oriented x4, however continues to have limited awareness of his deficits   Blood pressure 111/69, pulse (!) 130, temperature 98.6 F (37 C), temperature source Oral, resp. rate (!) 32, height 5' 7.99" (1.727 m), weight 71.2 kg, SpO2 94 %. Physical Exam  Nursing note and vitals reviewed. Constitutional: He appears well-developed.  Cachectic  HENT:  Head: Normocephalic and atraumatic.  +Feeding tube +NG  Eyes: EOM are normal. Right eye exhibits no discharge. Left eye exhibits no discharge.  Neck:  +Trach  Cardiovascular: Regular rhythm.  +Tachycardia  Respiratory: Breath sounds normal.  +Tachypnea  GI:  Abdominal incision  Musculoskeletal:  No edema or tenderness in extremities  Neurological: He is alert.  Confused and distracted but able to follow basic commands with increased time Thought he was at [facility] and was here due to "helipad".  Motor: 4+/5 throughout  Skin: Skin is warm and dry.  See above  Psychiatric: His affect is blunt. He is slowed. He expresses impulsivity.  Assessment/Plan: Diagnosis: Debility Labs independently reviewed.  Records reviewed and summated above.  1. Does the need for close, 24 hr/day medical supervision in concert with the patient's rehab needs make it unreasonable for this patient to be served in a less intensive setting? Yes 2. Co-Morbidities requiring supervision/potential complications: MDD (cont meds), polysubstance abuse (counsel when appropriate), multiple TBI's, anoxic BI, chronic pain (Biofeedback training with therapies to help reduce reliance on opiate pain  medications, monitor pain control during therapies, and sedation at rest and titrate to maximum efficacy to ensure participation and gains in therapies), chronic constipation, Tachycardia (monitor in accordance with pain and increasing activity), tachypnea (monitor RR and O2 Sats with increased physical exertion), leukocytosis (cont to monitor for signs and symptoms of infection, further workup if indicated), bilateral PE (cont meds) 3. Due to bladder management, bowel management, safety, skin/wound care, disease management, medication administration, pain management and patient education, does the patient require 24 hr/day rehab nursing? Yes 4. Does the patient require coordinated care of a physician, rehab nurse, PT (1-2 hrs/day, 5 days/week), OT (1-2 hrs/day, 5 days/week) and SLP (1-2 hrs/day, 5 days/week) to address physical and functional deficits in the context of the above medical diagnosis(es)? Yes Addressing deficits in the following areas: balance, endurance, locomotion, strength, transferring, bowel/bladder control, bathing, dressing, feeding, grooming, toileting, cognition, speech, swallowing and psychosocial support 5. Can the patient actively participate in an intensive therapy program of at least 3 hrs of therapy per day at least 5 days per week? Potentially 6. The potential for  patient to make measurable gains while on inpatient rehab is excellent 7. Anticipated functional outcomes upon discharge from inpatient rehab are min assist  with PT, min assist with OT, min assist with SLP. 8. Estimated rehab length of stay to reach the above functional goals is: 20-25 days. 9. Anticipated D/C setting: TBD 10. Anticipated post D/C treatments: HH therapy and Home excercise program 11. Overall Rehab/Functional Prognosis: good and fair  RECOMMENDATIONS: This patient's condition is appropriate for continued rehabilitative care in the following setting: CIR once medically stable and able to tolerate  3 hours of therapy/day if 24/7 caregiver support and supervision available. Patient has agreed to participate in recommended program. Potentially Note that insurance prior authorization may be required for reimbursement for recommended care.  Comment: Rehab Admissions Coordinator to follow up.   I have personally performed a face to face diagnostic evaluation, including, but not limited to relevant history and physical exam findings, of this patient and developed relevant assessment and plan.  Additionally, I have reviewed and concur with the physician assistant's documentation above.   Maryla Morrow, MD, ABPMR Jacquelynn Cree, PA-C 10/11/2017        Revision History                        Routing History

## 2017-10-21 NOTE — Progress Notes (Signed)
SLP Cancellation Note  Patient Details Name: Philip Thompson MRN: [id number] DOB: [birth year]   Cancelled treatment:       Reason Eval/Treat Not Completed: Other (comment) Pt is now decannulated per chart review, therefore PMV goals no longer applicable. SLP to sign off. If swallow evaluation is wanted prior to resuming POs, please order SLP when medically ready. Thanks!   Maxcine Ham 10/21/2017, 8:37 AM  Maxcine Ham, M.A. CCC-SLP Acute Herbalist [phone] Office [phone]

## 2017-10-21 NOTE — Progress Notes (Signed)
Physical Therapy Treatment Patient Details Name: Philip Thompson MRN: [id number] DOB: Apr 25, 1990 Today's Date: 10/21/2017    History of Present Illness Pt is a 27 y.o. 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 Had acute aspiration pneumonia on 9/10 prior to surgery in the setting of nausea/vomiting. S/p total colectomy and ileostomy on 9/10. Extub 9/12; reintubated 9/13. Trach and cortrak placed 9/18. PMH includes anxiety, depression, substance abuse disorder.     PT Comments    Patient seen for mobility progression. Pt requires supervision/min guard for bed and functional transfers and min A for gait training. Pt is making progress toward PT goals and tolerated increased gait distance this session with seated rest break. Continue to recommend CIR for further skilled PT services to maximize independence and safety with mobility.    Follow Up Recommendations  CIR;Supervision for mobility/OOB     Equipment Recommendations  (TBD)    Recommendations for Other Services       Precautions / Restrictions Precautions Precautions: Fall Precaution Comments: Multiple lines (trach/PMV, ostomy, cortrak, NGT)    Mobility  Bed Mobility Overal bed mobility: Needs Assistance Bed Mobility: Supine to Sit;Sit to Supine     Supine to sit: Supervision;HOB elevated Sit to supine: Supervision;HOB elevated   General bed mobility comments: cues for safety with lines when returning to bed as pt had no awareness of where lines were prior to returning to supine   Transfers Overall transfer level: Needs assistance Equipment used: Rolling walker (2 wheeled) Transfers: Sit to/from Stand Sit to Stand: Min guard         General transfer comment: VCs for safe hand placement  Ambulation/Gait Ambulation/Gait assistance: Min assist Gait Distance (Feet): (~158ft total with seated rest break) Assistive device:  Rolling walker (2 wheeled) Gait Pattern/deviations: Step-through pattern;Trunk flexed;Decreased stride length Gait velocity: Decreased   General Gait Details: pt limited by fatigue and required seated rest break; cues for posture and breathing technique   Stairs             Wheelchair Mobility    Modified Rankin (Stroke Patients Only)       Balance Overall balance assessment: Needs assistance Sitting-balance support: No upper extremity supported;Feet supported Sitting balance-Leahy Scale: Good     Standing balance support: During functional activity;Single extremity supported Standing balance-Leahy Scale: Fair                              Cognition Arousal/Alertness: Awake/alert Behavior During Therapy: Flat affect Overall Cognitive Status: Impaired/Different from baseline Area of Impairment: Following commands;Safety/judgement;Problem solving                       Following Commands: Follows one step commands consistently Safety/Judgement: Decreased awareness of safety;Decreased awareness of deficits   Problem Solving: Slow processing;Requires verbal cues        Exercises      General Comments General comments (skin integrity, edema, etc.): mother present      Pertinent Vitals/Pain Pain Assessment: Faces Faces Pain Scale: Hurts a little bit Pain Location: upper back (chronic--"hurts sometimes more than other") Pain Descriptors / Indicators: Aching Pain Intervention(s): Monitored during session    Home Living                      Prior Function            PT  Goals (current goals can now be found in the care plan section) Progress towards PT goals: Progressing toward goals    Frequency    Min 3X/week      PT Plan Current plan remains appropriate    Co-evaluation              AM-PAC PT "6 Clicks" Daily Activity  Outcome Measure  Difficulty turning over in bed (including adjusting bedclothes, sheets  and blankets)?: A Little Difficulty moving from lying on back to sitting on the side of the bed? : A Little Difficulty sitting down on and standing up from a chair with arms (e.g., wheelchair, bedside commode, etc,.)?: Unable Help needed moving to and from a bed to chair (including a wheelchair)?: A Little Help needed walking in hospital room?: A Little Help needed climbing 3-5 steps with a railing? : A Lot 6 Click Score: 15    End of Session Equipment Utilized During Treatment: Gait belt Activity Tolerance: Patient tolerated treatment well Patient left: with call bell/phone within reach;with family/visitor present;in bed Nurse Communication: Mobility status PT Visit Diagnosis: Unsteadiness on feet (R26.81);Other abnormalities of gait and mobility (R26.89);Muscle weakness (generalized) (M62.81);Difficulty in walking, not elsewhere classified (R26.2);Pain Pain - Right/Left: (lower) Pain - part of body: (Back, abdomen)     Time: 4540-9811 PT Time Calculation (min) (ACUTE ONLY): 22 min  Charges:  $Gait Training: 8-22 mins                     Erline Levine, PTA Acute Rehabilitation Services Pager: [phone] Office: [phone]     Carolynne Edouard 10/21/2017, 11:31 AM

## 2017-10-21 NOTE — Progress Notes (Signed)
Inpatient Rehabilitation  I have reviewed case with Dr. Riley Kill.  Patient medically ready for an IP Rehab admission.  I have updated [employer] and it was confirmed that authorization for admission stands.  I have received acute medical clearance as well and have a bed to offer patient today.  Called and updated mom, Cathy via phone.  Will update team, prepare, and proceed with admission today.  Call if questions.   Charlane Ferretti., CCC/SLP Admission Coordinator  [facility] Inpatient Rehabilitation  Cell [phone]

## 2017-10-21 NOTE — Progress Notes (Signed)
Initial Nutrition Assessment  DOCUMENTATION CODES:   Severe malnutrition in context of chronic illness  INTERVENTION:   Tube Feeding via J-tube:  Pivot 1.5 @ 80 ml/hr (1600 ml/day) for 20 hours Provide 2400 kcal, 150 grams of protein, and 1216 ml free water Meets 100% estimated calorie and protein needs  Follow for diet advancement as able  NUTRITION DIAGNOSIS:   Severe Malnutrition related to chronic illness(CM, chronic constipation/obstipation with megacolon, chronic substance abuse) as evidenced by severe fat depletion, severe muscle depletion.  GOAL:   Patient will meet greater than or equal to 90% of their needs  MONITOR:   TF tolerance, Diet advancement, Labs, Weight trends, Skin  REASON FOR ASSESSMENT:   New TF    ASSESSMENT:   27 yo male admitted to [facility] on 9/4 for megacolon with severe obstructive constipation, severely dilated colon and colitis requiring total colectomy with ileostomy on 9/10 and ultimately required G-J tube for nutrition; pt also with bilateral PE on admission, on vent support ultimately requiring trach placement. PMH includes polysubstance abuse, drug-induced cardiomyopathy, anxiety, depression, chronic constipation/obstipation  9/10- total colectomy with ileostomy, vent 9/11 - PICC line inserted 9/12 - TPN initiated, extubated 9/13 - re-Intubated 9/16 - trickle feedings started 9/18 - trach placed, Cortrak  9/21 - TPN re-started, NG tube for decompression, TF held 9/23 - diagnostic Radiology advanced Cortrak tube to post-pyloric position, JP drain removed 9/26 -  abdominal xray confirms Cortrak tube in transverse duodenum, NG tube in stomach 9/27 - TPN discontinued, TF to goal rate 9/30 - IR placed G-tube 10/1 - capping trials started, tolerated well 10/2 - IR converted G-tube to G-J tube, Cortrak removed 10/3 Trach decannulated  Prior to transfer to to [facility] today, pt tolerating Vital AF 1.2 @ 80 ml/hr via  J-tube G-tube for decompression, minimal output at present  Ileostomy with around 1300 mL in last 24 hours  Current wt 64.9 kg; weight down significant from admission wt on 9/4 (93.8 kg) up to 105.9 kg post surgery. However, most of this weight los can be attributed to the removal of colon (weighed 45 pounds post removal) and significant fluid loss as pt severely edematous on admission and post surgery. Pt with no edema at present. RD plans to keep close eye on weight trend however  Remains hyponatremic; if this is related to high G-tube output and ileostomy output, pt may need to supplement sodium via salt tabs  Labs: CBGs 103-125, sodium 129 Meds:  No meds ordered at present  NUTRITION - FOCUSED PHYSICAL EXAM:    Most Recent Value  Orbital Region  Severe depletion  Upper Arm Region  Severe depletion  Thoracic and Lumbar Region  Severe depletion  Buccal Region  Severe depletion  Temple Region  Severe depletion  Clavicle Bone Region  Severe depletion  Clavicle and Acromion Bone Region  Severe depletion  Scapular Bone Region  Severe depletion  Dorsal Hand  Moderate depletion  Patellar Region  Moderate depletion  Anterior Thigh Region  Moderate depletion  Posterior Calf Region  Moderate depletion  Edema (RD Assessment)  None       Diet Order:   Diet Order    None      EDUCATION NEEDS:   Not appropriate for education at this time  Skin:  Skin Assessment: Skin Integrity Issues: Stage III: intragluteal (coccygeal) improving, dry, healing scabbed area without depth or drainage Incisions: abdomen (surgical)  Last BM:  +stool via ileostomy  Height:   Ht  Readings from Last 1 Encounters:  10/21/17 5\' 8"  (1.727 m)    Weight:   Wt Readings from Last 1 Encounters:  10/21/17 64.9 kg    Ideal Body Weight:     BMI:  Body mass index is 21.76 kg/m.  Estimated Nutritional Needs:   Kcal:  2300-2500 kcals   Protein:  130-150 g  Fluid:  >/= 2L    Romelle Starcher MS,  RD, LDN, CNSC [phone] Pager  [phone] Weekend/On-Call Pager

## 2017-10-21 NOTE — Progress Notes (Signed)
Pt A/O, no noted distress. Denies pain. Educated pt of rehab unit and expectation. He is hoping to leave in a week. Heels has hard skin, administered barrier to feet. Smokes a ppd 1/2 a day.

## 2017-10-22 ENCOUNTER — Inpatient Hospital Stay (HOSPITAL_COMMUNITY): Payer: Self-pay | Admitting: Occupational Therapy

## 2017-10-22 ENCOUNTER — Inpatient Hospital Stay (HOSPITAL_COMMUNITY): Payer: Self-pay | Admitting: Physical Therapy

## 2017-10-22 DIAGNOSIS — E871 Hypo-osmolality and hyponatremia: Secondary | ICD-10-CM

## 2017-10-22 LAB — BASIC METABOLIC PANEL
ANION GAP: 8 (ref 5–15)
BUN: 18 mg/dL (ref 6–20)
CALCIUM: 9.7 mg/dL (ref 8.9–10.3)
CO2: 35 mmol/L — AB (ref 22–32)
Chloride: 85 mmol/L — ABNORMAL LOW (ref 98–111)
Creatinine, Ser: 0.74 mg/dL (ref 0.61–1.24)
GFR calc non Af Amer: >60 mL/min (ref >60)
GLUCOSE: 93 mg/dL (ref 70–99)
POTASSIUM: 3.9 mmol/L (ref 3.5–5.1)
Sodium: 128 mmol/L — ABNORMAL LOW (ref 135–145)

## 2017-10-22 LAB — GLUCOSE, CAPILLARY
GLUCOSE-CAPILLARY: 108 mg/dL — AB (ref 70–99)
GLUCOSE-CAPILLARY: 87 mg/dL (ref 70–99)
GLUCOSE-CAPILLARY: 106 mg/dL — AB (ref 70–99)
GLUCOSE-CAPILLARY: 62 mg/dL — AB (ref 70–99)
GLUCOSE-CAPILLARY: 131 mg/dL — AB (ref 70–99)
Glucose-Capillary: 110 mg/dL — ABNORMAL HIGH (ref 70–99)

## 2017-10-22 LAB — CBC WITH DIFFERENTIAL/PLATELET
ABS IMMATURE GRANULOCYTES: 0 10*3/uL (ref 0.0–0.1)
BASOS PCT: 1 %
Basophils Absolute: 0.1 10*3/uL (ref 0.0–0.1)
Eosinophils Absolute: 0.2 10*3/uL (ref 0.0–0.7)
Eosinophils Relative: 2 %
HCT: 42.5 % (ref 39.0–52.0)
HEMOGLOBIN: 14 g/dL (ref 13.0–17.0)
IMMATURE GRANULOCYTES: 1 %
LYMPHS PCT: 37 %
Lymphs Abs: 2.7 10*3/uL (ref 0.7–4.0)
MCH: 28.6 pg (ref 26.0–34.0)
MCHC: 32.9 g/dL (ref 30.0–36.0)
MCV: 86.7 fL (ref 78.0–100.0)
MONO ABS: 1.3 10*3/uL — AB (ref 0.1–1.0)
MONOS PCT: 18 %
NEUTROS ABS: 2.9 10*3/uL (ref 1.7–7.7)
NEUTROS PCT: 41 %
PLATELETS: 235 10*3/uL (ref 150–400)
RBC: 4.9 MIL/uL (ref 4.22–5.81)
RDW: 13 % (ref 11.5–15.5)
WBC: 7.2 10*3/uL (ref 4.0–10.5)

## 2017-10-22 LAB — CULTURE, BAL-QUANTITATIVE

## 2017-10-22 LAB — MAGNESIUM
MAGNESIUM: 1.8 mg/dL (ref 1.7–2.4)
MAGNESIUM: 1.8 mg/dL (ref 1.7–2.4)

## 2017-10-22 LAB — CULTURE, BAL-QUANTITATIVE W GRAM STAIN: Culture: 30000 — AB

## 2017-10-22 LAB — PHOSPHORUS
Phosphorus: 2.1 mg/dL — ABNORMAL LOW (ref 2.5–4.6)
Phosphorus: 2.6 mg/dL (ref 2.5–4.6)

## 2017-10-22 NOTE — Plan of Care (Signed)
LONG TERM GOALS ESTABLISHED 10/22/17

## 2017-10-22 NOTE — Plan of Care (Signed)
  Problem: Consults Goal: RH GENERAL PATIENT EDUCATION Description See Patient Education module for education specifics. Outcome: Progressing Goal: Skin Care Protocol Initiated - if Braden Score 18 or less Description If consults are not indicated, leave blank or document N/A Outcome: Progressing   Problem: RH BOWEL ELIMINATION Goal: RH STG MANAGE BOWEL WITH ASSISTANCE Description STG Manage Bowel with min Assistance.  Outcome: Progressing Goal: RH STG MANAGE BOWEL W/MEDICATION W/ASSISTANCE Description STG Manage Bowel with Medication with min Assistance.  Outcome: Progressing   Problem: RH BLADDER ELIMINATION Goal: RH STG MANAGE BLADDER WITH ASSISTANCE Description STG Manage Bladder With min Assistance  Outcome: Progressing Goal: RH STG MANAGE BLADDER WITH MEDICATION WITH ASSISTANCE Description STG Manage Bladder With Medication With min Assistance.  Outcome: Progressing Goal: RH STG MANAGE BLADDER WITH EQUIPMENT WITH ASSISTANCE Description STG Manage Bladder With Equipment With min  Assistance  Outcome: Progressing   Problem: RH SKIN INTEGRITY Goal: RH STG SKIN FREE OF INFECTION/BREAKDOWN Outcome: Progressing Goal: RH STG MAINTAIN SKIN INTEGRITY WITH ASSISTANCE Description STG Maintain Skin Integrity With min-mod Assistance.  Outcome: Progressing Goal: RH STG ABLE TO PERFORM INCISION/WOUND CARE W/ASSISTANCE Description STG Able To Perform Incision/Wound Care With min-mod Assistance.  Outcome: Progressing   Problem: RH SAFETY Goal: RH STG ADHERE TO SAFETY PRECAUTIONS W/ASSISTANCE/DEVICE Description STG Adhere to Safety Precautions With min-mod Assistance/Device.  Outcome: Progressing Goal: RH STG DECREASED RISK OF FALL WITH ASSISTANCE Description STG Decreased Risk of Fall With min-mod Assistance.  Outcome: Progressing   Problem: RH PAIN MANAGEMENT Goal: RH STG PAIN MANAGED AT OR BELOW [name] PAIN GOAL Outcome: Progressing   Problem: RH KNOWLEDGE DEFICIT  GENERAL Goal: RH STG INCREASE KNOWLEDGE OF SELF CARE AFTER HOSPITALIZATION Outcome: Progressing   Problem: RH Vision Goal: RH LTG Vision (Specify) Outcome: Progressing   Problem: RH Pre-functional/Other (Specify) Goal: RH LTG Pre-functional (Specify) Outcome: Progressing Goal: RH LTG Interdisciplinary (Specify) 1 Description RH LTG Interdisciplinary (Specify)1 Outcome: Progressing Goal: RH LTG Interdisciplinary (Specify) 2 Description RH LTG Interdisciplinary (Specify) 2  Outcome: Progressing

## 2017-10-22 NOTE — Progress Notes (Signed)
Occupational Therapy Session Note  Patient Details  Name: JAHEL LELEUX MRN: [id number] Date of Birth: 1990/08/10  Today's Date: 10/22/2017 OT Individual Time: 1430-1530 OT Individual Time Calculation (min): 60 Short Term Goals: Week 1:  OT Short Term Goal 1 (Week 1): Pt will be mod I with grooming standing level. OT Short Term Goal 2 (Week 1): Pt will be mod I with bathing in sitting and in standing OT Short Term Goal 3 (Week 1): Pt will be mod I with grooming activities OT Short Term Goal 4 (Week 1): Pt will be mod I with simple meal prep OT Short Term Goal 5 (Week 1): Pt will verbalize steps for caring for ostomy bag  Skilled Therapeutic Interventions/Progress Updates:  Balance/vestibular training;Community reintegration;Discharge planning;DME/adaptive equipment instruction;Functional mobility training;Neuromuscular re-education;Patient/family education;Pain management;Self Care/advanced ADL retraining;Skin care/wound managment;Therapeutic Activities;Therapeutic Exercise;UE/LE Strength taining/ROM;UE/LE Coordination activities  Pt agreed to OT this afternoon.  Planned to shower but for medical reasons decided to hold off.  Ambulated to sink and pt leaned over to wash hair.  OT assisted with this task.  Pt performed activity in standing for 20 minutes.  Combed hair, dried hair.  Donned new pair of slip on shoes with increased time.  Ambulated with min assist plus RW to nursing station and back.  Mother present during session.   Pt left in room with Mother and all needs in place.   Therapy Documentation Precautions:  Precautions Precautions: Fall Precaution Comments: /gJ tubel IV right arm Other Brace/Splint: NGT, coretrack, trach, PMV, ostomy Restrictions Weight Bearing Restrictions: No   Vital Signs: Therapy Vitals Temp: 98.6 F (37 C) Pulse Rate: 88 Resp: 16 BP: 106/64 Patient Position (if appropriate): Lying Oxygen Therapy SpO2: 100 % O2 Device: Room Air Pain: Pain  Assessment Pain Scale: 0-10 Pain Score: 0 Pain Type:  Pain Location:  Pain Orientation:  Pain Descriptors / Indicators:  Pain Onset:  Pain Intervention(s): Medication (See eMAR)    Vision Baseline Vision/History: No visual deficits Patient Visual Report: No change from baseline Vision Assessment?: No apparent visual deficits Perception  Perception: Within Functional Limits Praxis Praxis: Intact Exercises:   Other Treatments:     Therapy/Group: Individual Therapy  Humberto Seals 10/22/2017, 4:56 PM

## 2017-10-22 NOTE — Progress Notes (Signed)
[facility] PHYSICAL MEDICINE & REHABILITATION PROGRESS NOTE  Subjective/Complaints: Patient seen this morning.  He states he slept well overnight.  He is ready to begin therapies and wants to know to schedule is.  Discussed with SLP.  ROS: Denies CP, SOB, nausea, vomiting, diarrhea.  Objective: Vital Signs: Blood pressure 100/61, pulse 97, temperature (!) 97.5 F (36.4 C), temperature source Oral, resp. rate 16, height 5\' 8"  (1.727 m), weight 64.9 kg, SpO2 100 %. No results found. Recent Labs    10/21/17 1840 10/22/17 0001  WBC 8.3 7.2  HGB 14.4 14.0  HCT 43.1 42.5  PLT 277 235   Recent Labs    10/21/17 0639 10/21/17 1840 10/22/17 0001  NA 129*  --  128*  K 3.6  --  3.9  CL 82*  --  85*  CO2 32  --  35*  GLUCOSE 115*  --  93  BUN 25*  --  18  CREATININE 0.79 0.64 0.74  CALCIUM 10.2  --  9.7    Physical Exam: BP 100/61 (BP Location: Left Arm)   Pulse 97   Temp (!) 97.5 F (36.4 C) (Oral)   Resp 16   Ht 5\' 8"  (1.727 m)   Wt 64.9 kg   SpO2 100%   BMI 21.76 kg/m  Constitutional: NAD.  Frail. HENT: Normocephalic and atraumatic.  Eyes: EOMI.  No discharge. Neck: Dressing to stoma C/C/I Cardiovascular: Normal rate and regular rhythm.  No JVD. Respiratory: Effort normal. No respiratory distress. He has no wheezes.  GI: Bowel sounds are normal. He exhibits no distension. Ileostomy which sealed.  + G/J tube  Musculoskeletal: He exhibits no edema and no tenderness.  Neurological: He is alert and oriented Patient is alert.  Follows simple commands with reasonable insight.  Motor: 4/5 grossly throughout Skin: Skin is warm. See above Psychiatric: Flat but cooperative   Assessment/Plan: 1. Functional deficits secondary to debility which require 3+ hours per day of interdisciplinary therapy in a comprehensive inpatient rehab setting.  Physiatrist is providing close team supervision and 24 hour management of active medical problems listed below.  Physiatrist and  rehab team continue to assess barriers to discharge/monitor patient progress toward functional and medical goals  Care Tool:  Bathing              Bathing assist       Upper Body Dressing/Undressing Upper body dressing        Upper body assist      Lower Body Dressing/Undressing Lower body dressing            Lower body assist       Toileting Toileting    Toileting assist       Transfers Chair/bed transfer  Transfers assist           Locomotion Ambulation   Ambulation assist              Walk 10 feet activity   Assist           Walk 50 feet activity   Assist           Walk 150 feet activity   Assist           Walk 10 feet on uneven surface  activity   Assist           Wheelchair     Assist               Wheelchair 50 feet with 2 turns activity  Assist            Wheelchair 150 feet activity     Assist          Medical Problem List and Plan:  1. 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 Bilateral PE/Anticoagulation: Subcutaneous Lovenox transitioned to Eliquis 10/21/2017 status post IVC filter 10/02/2017  3. Pain Management: Neurontin 300 mg every 12 hours, oxycodone as needed  4. Mood: BuSpar 10 mg twice daily, Celexa 20 mg daily, Klonopin 1 mg every 8 hours, Inderal 40 mg 3 times daily, Seroquel 200 mg nightly as well as PRN, trazodone 100 mg nightly, valproic acid 500 mg twice daily   Appreciate neuropsych   Optimize sleep patterns   Team to provide positive reinforcement  5. Neuropsych: This patient is not fully capable of  making decisions on his own behalf.  6. Skin/Wound Care: Routine skin checks  7. Fluids/Electrolytes/Nutrition: Strict in and outs   TF through J port   G port to suction   May have ice chips  8. VDRF: Status post tracheostomy decannulated 10/20/2017   cclusive dressing in place, wound closing  10. Hypothyroidism. Synthroid  11. Tobacco and polysubstance abuse. Continue NicoDerm patch. Provide counseling  12.  Hyponatremia  Sodium 128 on 10/5  Continue to monitor  LOS: 1 days A FACE TO FACE EVALUATION WAS PERFORMED  Ankit Karis Juba 10/22/2017, 7:20 AM

## 2017-10-22 NOTE — Progress Notes (Signed)
Stoma site WNL. No drainage on guaze. Stoma site dry. RT applied new dry guaze with no guaze. Pt states stoma site feels good no pain at this time
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
## 2017-10-22 NOTE — IPOC Note (Addendum)
Overall Plan of Care [facility]) Patient Details Name: Philip Thompson MRN: [id number] DOB: [birth year]  Admitting Diagnosis: Debility  Hospital Problems: Active Problems:   Polysubstance abuse (HCC)   Megacolon   Ogilvie's syndrome   Anxiety and depression   Debility   Hyponatremia   Labile blood glucose   Hypoglycemia     Functional Problem List: Nursing Behavior, Bladder, Bowel, Medication Management, Pain, Safety, Skin Integrity  PT Balance, Endurance  OT Balance, Endurance, Motor, Safety  SLP    TR         Basic ADL's: OT Grooming, Bathing, Dressing, Toileting, Eating     Advanced  ADL's: OT Simple Meal Preparation     Transfers: PT Bed Mobility, Bed to Chair, Car, Furniture, Floor  OT Toilet, Research scientist (life sciences): PT Ambulation, Stairs     Additional Impairments: OT    SLP        TR      Anticipated Outcomes Item Anticipated Outcome  Self Feeding mod independent(Pt will verbalize the diet he is on.  )  Swallowing      Basic self-care  mod I  Toileting  mod I   Bathroom Transfers Mod I  Bowel/Bladder  Colostomy,Cont Bladder  Transfers  Mod I  Locomotion  Mod I with LRAD  Communication     Cognition     Pain  0/10 pain, tolerable 5/10, assess pain as needed, administered prn   Safety/Judgment  Refrain from falls/injuries    Therapy Plan: PT Intensity: Minimum of 1-2 x/day ,45 to 90 minutes PT Frequency: 5 out of 7 days PT Duration Estimated Length of Stay: 5-7 days OT Intensity: Minimum of 1-2 x/day, 45 to 90 minutes OT Frequency: 5 out of 7 days OT Duration/Estimated Length of Stay: 5-7 days      Team Interventions: Nursing Interventions Patient/Family Education, Bladder Management, Bowel Management, Pain Management, Medication Management, Skin Care/Wound Management  PT interventions Ambulation/gait training, Warden/ranger, Community reintegration, Discharge planning, Disease management/prevention, DME/adaptive  equipment instruction, Functional mobility training, Neuromuscular re-education, Pain management, Patient/family education, Psychosocial support, Stair training, Therapeutic Activities, Therapeutic Exercise, UE/LE Strength taining/ROM, UE/LE Coordination activities  OT Interventions Warden/ranger, Community reintegration, Discharge planning, DME/adaptive equipment instruction, Functional mobility training, Neuromuscular re-education, Patient/family education, Pain management, Self Care/advanced ADL retraining, Skin care/wound managment, Therapeutic Activities, Therapeutic Exercise, UE/LE Strength taining/ROM, UE/LE Coordination activities  SLP Interventions    TR Interventions    SW/CM Interventions Discharge Planning, Psychosocial Support, Patient/Family Education   Barriers to Discharge MD  Medical stability, Wound care, Lack of/limited family support and History of multiple TBI's and polysubstance abuse  Nursing      PT Medical stability, Nutrition means    OT      SLP      SW       Team Discharge Planning: Destination: PT-Home ,OT- Home , SLP-  Projected Follow-up: PT-Home health PT, OT-  None, SLP-  Projected Equipment Needs: PT-To be determined, OT- Tub/shower bench, SLP-  Equipment Details: PT-RW vs no AD, OT-  Patient/family involved in discharge planning: PT- Patient,  OT-Patient, Family member/caregiver(Mother present), SLP-   MD ELOS: 3-5 days. Medical Rehab Prognosis:  Good Assessment:  27 year old right-handed male with history of MDD, multiple TBI as well as anoxic BI, chronic pain, SI, polysubstance abuse, chronic constipation; who was admitted to [facility] on 09/21/2017 with 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 He was transferred to [facility] for treatment  and CT scan done revealing bilateral PE as well as massive distention of colon. Dr. August Saucer was consulted for input and recommended neostigmine trial with monitoring.  He was started on IV heparin for PE and and cardiology consulted due to reports of chest pain. Pain felt to be noncardiac in nature and and maintained on anticoagulation initially with Xarelto but held due to drain site bleeding and an IVC filter was placed 10/02/2017 per interventional radiology with recommendations to retrieve filter after 4 weeks.. Currently maintained on subcutaneous Lovenox and transitioned to Eliquis 10/21/2017. As he had no improvement in colonic inertia, he was taken to the OR for subtotal colectomy and ileostomy on zero 9/10 by Dr. Magnus Ivan. He required intubation and sedation as well as NG tube due to high volume output as well as TPN for nutritional support. He has had issues with anxiety, agitation and difficulty with vent wean and difficulty with vent wean requiring tracheostomy on 9/18 by Dr. Molli Knock and decannulated 10/20/2017. Psychiatry consulted for input due to suicidal ideation with worsening of anxiety and poor sleep. Initially maintained on Precedex for agitation weaned off 10/10/2017. Multiple medication changes recommended and psych has signed off as "no evidence of imminent risk to self or others noted." He continued to have high volume output from NG tube and gastrostomy tube was placed by Dr. Shella Spearing on 9/30. He was noted to have high output through G-tube to gravity therefore tube feeds resumed via cortak and gastric tube converted to GJ tube on 10/2. It has been discussed at length that continued feeds are through J-tube and gravity bag maintained to G-tube. Continues to be deconditioned requiring multiple rest breaks as well as tachycardia noted with activity.  We will set goals for Mod I with PT/OT.  See Team Conference Notes for weekly updates to the plan of care
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
## 2017-10-23 ENCOUNTER — Inpatient Hospital Stay (HOSPITAL_COMMUNITY): Payer: Self-pay | Admitting: Occupational Therapy

## 2017-10-23 DIAGNOSIS — R7309 Other abnormal glucose: Secondary | ICD-10-CM

## 2017-10-23 DIAGNOSIS — E162 Hypoglycemia, unspecified: Secondary | ICD-10-CM

## 2017-10-23 LAB — BASIC METABOLIC PANEL
ANION GAP: 10 (ref 5–15)
BUN: 18 mg/dL (ref 6–20)
CALCIUM: 10 mg/dL (ref 8.9–10.3)
CO2: 26 mmol/L (ref 22–32)
Chloride: 92 mmol/L — ABNORMAL LOW (ref 98–111)
Creatinine, Ser: 0.61 mg/dL (ref 0.61–1.24)
GFR calc non Af Amer: >60 mL/min (ref >60)
GLUCOSE: 90 mg/dL (ref 70–99)
Potassium: 5.1 mmol/L (ref 3.5–5.1)
Sodium: 128 mmol/L — ABNORMAL LOW (ref 135–145)

## 2017-10-23 LAB — GLUCOSE, CAPILLARY
GLUCOSE-CAPILLARY: 93 mg/dL (ref 70–99)
GLUCOSE-CAPILLARY: 115 mg/dL — AB (ref 70–99)
Glucose-Capillary: 131 mg/dL — ABNORMAL HIGH (ref 70–99)
Glucose-Capillary: 133 mg/dL — ABNORMAL HIGH (ref 70–99)
Glucose-Capillary: 96 mg/dL (ref 70–99)

## 2017-10-23 LAB — MAGNESIUM
MAGNESIUM: 1.6 mg/dL — AB (ref 1.7–2.4)
Magnesium: 1.7 mg/dL (ref 1.7–2.4)

## 2017-10-23 LAB — PHOSPHORUS
PHOSPHORUS: 2.7 mg/dL (ref 2.5–4.6)
Phosphorus: 2.8 mg/dL (ref 2.5–4.6)

## 2017-10-23 NOTE — Progress Notes (Signed)
MD Allena Katz notified of Patient's incident with therapy ball with OT. Patient reporting some facial pain. No swelling, redness or bleeding noted. No new orders received. Patient given PRN pain medication.

## 2017-10-23 NOTE — Progress Notes (Signed)
Occupational Therapy Session Note  Patient Details  Name: Philip Thompson MRN: [id number] Date of Birth: April 15, 1990  Today's Date: 10/23/2017 OT Individual Time: 8115-7262 OT Individual Time Calculation (min): 30 min    Short Term Goals: Week 1:  OT Short Term Goal 1 (Week 1): Pt will be mod I with grooming standing level. OT Short Term Goal 2 (Week 1): Pt will be mod I with bathing in sitting and in standing OT Short Term Goal 3 (Week 1): Pt will be mod I with grooming activities OT Short Term Goal 4 (Week 1): Pt will be mod I with simple meal prep OT Short Term Goal 5 (Week 1): Pt will verbalize steps for caring for ostomy bag  Skilled Therapeutic Interventions/Progress Updates:    !:! Pt don clean shirt and pants with contact guard after setup.  Pt ambulated from his room to the gym with contact guard. In the gym continued focus on cardiopulmonary endurance and overall strengthening. Pt performed bounce and catching with 4 lb weighted ball on the rebounder. Able to progress from sitting to standing to sit/ standing inbetween passes. Pt did overestimate his strength and ball came back and bumped his nose.  Ice applied and reported to [employer]. Pt reported he is able to continue. Pt performed lateral squats to the right and left with resistance band (orange). Pt does require prolonged rest break in between tasks due to fatigue and elevated HR to 115-116. Performed UB exercises with weighted bar 3 lbs with focus on control and endurance. PT able to ambulate back to his room again with contact guard and return to EOB.    Therapy Documentation Precautions:  Precautions Precautions: Fall Precaution Comments: /gJ tubel IV right arm Other Brace/Splint: NGT, coretrack, trach, PMV, ostomy Restrictions Weight Bearing Restrictions: No Pain: After treatment reports his back is a little sore - requested meds at end of session - reported to RN.   Therapy/Group: Individual Therapy  Roney Mans  [facility] 10/23/2017, 11:50 AM

## 2017-10-23 NOTE — Progress Notes (Signed)
[place] PHYSICAL MEDICINE & REHABILITATION PROGRESS NOTE  Subjective/Complaints: Patient seen sitting up with edge of his bed this morning.  He states he is trying to call his mom on his phone.  He states he slept well overnight.  He notes he had a good first day of therapies yesterday.  Informed by nursing regarding hypoglycemia overnight.  ROS: Denies CP, SOB, nausea, vomiting, diarrhea.  Objective: Vital Signs: Blood pressure 103/67, pulse (!) 103, temperature 98.2 F (36.8 C), resp. rate 18, height 5\' 8"  (1.727 m), weight 65.1 kg, SpO2 92 %. No results found. Recent Labs    10/21/17 1840 10/22/17 0001  WBC 8.3 7.2  HGB 14.4 14.0  HCT 43.1 42.5  PLT 277 235   Recent Labs    10/21/17 0639 10/21/17 1840 10/22/17 0001  NA 129*  --  128*  K 3.6  --  3.9  CL 82*  --  85*  CO2 32  --  35*  GLUCOSE 115*  --  93  BUN 25*  --  18  CREATININE 0.79 0.64 0.74  CALCIUM 10.2  --  9.7    Physical Exam: BP 103/67 (BP Location: Left Arm)   Pulse (!) 103   Temp 98.2 F (36.8 C)   Resp 18   Ht 5\' 8"  (1.727 m)   Wt 65.1 kg   SpO2 92%   BMI 21.82 kg/m  Constitutional: NAD.  Frail. HENT: Normocephalic and atraumatic.  Eyes: EOMI.  No discharge. Neck: Dressing to stoma C/C/I Cardiovascular: RRR.  No JVD. Respiratory: Effort normal.  Clear. GI: Bowel sounds are normal. He exhibits no distension. Ileostomy which sealed.  + G/J tube  Musculoskeletal: He exhibits no edema and no tenderness.  Neurological: He is alert and oriented Patient is alert.  Follows simple commands with reasonable insight.  Motor: 4/5 grossly throughout, stable Skin: Skin is warm. See above.  Incision/tube C/D/I Psychiatric: Flat but cooperative   Assessment/Plan: 1. Functional deficits secondary to debility which require 3+ hours per day of interdisciplinary therapy in a comprehensive inpatient rehab setting.  Physiatrist is providing close team supervision and 24 hour management of active medical  problems listed below.  Physiatrist and rehab team continue to assess barriers to discharge/monitor patient progress toward functional and medical goals  Care Tool:  Bathing              Bathing assist       Upper Body Dressing/Undressing Upper body dressing        Upper body assist      Lower Body Dressing/Undressing Lower body dressing            Lower body assist       Toileting Toileting    Toileting assist Assist for toileting: Independent with assistive device     Transfers Chair/bed transfer  Transfers assist  Chair/bed transfer activity did not occur: N/A  Chair/bed transfer assist level: Minimal Assistance - Patient > 75%     Locomotion Ambulation   Ambulation assist   Ambulation activity did not occur: N/A  Assist level: Minimal Assistance - Patient > 75% Assistive device: Walker-rolling Max distance: 120'   Walk 10 feet activity   Assist     Assist level: Minimal Assistance - Patient > 75% Assistive device: Walker-rolling   Walk 50 feet activity   Assist    Assist level: Minimal Assistance - Patient > 75% Assistive device: Walker-rolling    Walk 150 feet activity   Assist Walk 150 feet  activity did not occur: Safety/medical concerns         Walk 10 feet on uneven surface  activity   Assist Walk 10 feet on uneven surfaces activity did not occur: Safety/medical concerns         Wheelchair     Assist Will patient use wheelchair at discharge?: No             Wheelchair 50 feet with 2 turns activity    Assist            Wheelchair 150 feet activity     Assist          Medical Problem List and Plan:  1. 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 Bilateral PE/Anticoagulation: Subcutaneous Lovenox transitioned  to Eliquis 10/21/2017 status post IVC filter 10/02/2017  3. Pain Management: Neurontin 300 mg every 12 hours, oxycodone as needed  4. Mood: BuSpar 10 mg twice daily, Celexa 20 mg daily, Klonopin 1 mg every 8 hours, Inderal 40 mg 3 times daily, Seroquel 200 mg nightly as well as PRN, trazodone 100 mg nightly, valproic acid 500 mg twice daily   Appreciate neuropsych   Optimize sleep patterns   Team to provide positive reinforcement  5. Neuropsych: This patient is not fully capable of making decisions on his own behalf.  6. Skin/Wound Care: Routine skin checks  7. Fluids/Electrolytes/Nutrition: Strict in and outs   TF through J port   G port to suction   May have ice chips  8. VDRF: Status post tracheostomy decannulated 10/20/2017   cclusive dressing in place, wound closing  10. Hypothyroidism. Synthroid  11. Tobacco and polysubstance abuse. Continue NicoDerm patch. Provide counseling  12.  Hyponatremia  Sodium 128 on 10/5  Labs ordered for tomorrow  Continue to monitor 13.  Labile blood glucose  Hypoglycemia overnight, continue to monitor  LOS: 2 days A FACE TO FACE EVALUATION WAS PERFORMED  Ankit Karis Juba 10/23/2017, 7:01 AM

## 2017-10-23 NOTE — Progress Notes (Signed)
Hypoglycemic Event  CBG:62  Treatment: 15 GM carbohydrate snack (on tube feeding)  Symptoms: None  Follow-up CBG: Time:2054 CBG Result:110  Possible Reasons for Event: Unknown  Comments/MD notified:Dr. Allena Katz notified, no treatment at this time. Re-check blood sugar in 30 minutes and treat if still low.    Philip Thompson, [employer]

## 2017-10-24 ENCOUNTER — Inpatient Hospital Stay (HOSPITAL_COMMUNITY): Payer: Self-pay | Admitting: Physical Therapy

## 2017-10-24 ENCOUNTER — Inpatient Hospital Stay (HOSPITAL_COMMUNITY): Payer: Self-pay | Admitting: Occupational Therapy

## 2017-10-24 DIAGNOSIS — D72829 Elevated white blood cell count, unspecified: Secondary | ICD-10-CM

## 2017-10-24 DIAGNOSIS — D62 Acute posthemorrhagic anemia: Secondary | ICD-10-CM

## 2017-10-24 DIAGNOSIS — Z87820 Personal history of traumatic brain injury: Secondary | ICD-10-CM

## 2017-10-24 LAB — COMPREHENSIVE METABOLIC PANEL
ALT: 56 U/L — ABNORMAL HIGH (ref 0–44)
AST: 35 U/L (ref 15–41)
Albumin: 2.9 g/dL — ABNORMAL LOW (ref 3.5–5.0)
Alkaline Phosphatase: 121 U/L (ref 38–126)
Anion gap: 6 (ref 5–15)
BUN: 15 mg/dL (ref 6–20)
CO2: 28 mmol/L (ref 22–32)
Calcium: 9 mg/dL (ref 8.9–10.3)
Chloride: 94 mmol/L — ABNORMAL LOW (ref 98–111)
Creatinine, Ser: 0.6 mg/dL — ABNORMAL LOW (ref 0.61–1.24)
GFR calc Af Amer: >60 mL/min (ref >60)
GFR calc non Af Amer: >60 mL/min (ref >60)
Glucose, Bld: 93 mg/dL (ref 70–99)
Potassium: 4.7 mmol/L (ref 3.5–5.1)
Sodium: 128 mmol/L — ABNORMAL LOW (ref 135–145)
Total Bilirubin: 0.3 mg/dL (ref 0.3–1.2)
Total Protein: 6.3 g/dL — ABNORMAL LOW (ref 6.5–8.1)

## 2017-10-24 LAB — CBC WITH DIFFERENTIAL/PLATELET
Abs Immature Granulocytes: 0.1 10*3/uL (ref 0.0–0.1)
Basophils Absolute: 0.1 10*3/uL (ref 0.0–0.1)
Basophils Relative: 0 %
EOS ABS: 0.2 10*3/uL (ref 0.0–0.7)
EOS PCT: 1 %
HEMATOCRIT: 36.5 % — AB (ref 39.0–52.0)
Hemoglobin: 11.8 g/dL — ABNORMAL LOW (ref 13.0–17.0)
Immature Granulocytes: 1 %
LYMPHS ABS: 1.6 10*3/uL (ref 0.7–4.0)
Lymphocytes Relative: 9 %
MCH: 29.1 pg (ref 26.0–34.0)
MCHC: 32.3 g/dL (ref 30.0–36.0)
MCV: 89.9 fL (ref 78.0–100.0)
Monocytes Absolute: 1.9 10*3/uL — ABNORMAL HIGH (ref 0.1–1.0)
Monocytes Relative: 10 %
Neutro Abs: 14.6 10*3/uL — ABNORMAL HIGH (ref 1.7–7.7)
Neutrophils Relative %: 79 %
Platelets: 203 10*3/uL (ref 150–400)
RBC: 4.06 MIL/uL — AB (ref 4.22–5.81)
RDW: 13.1 % (ref 11.5–15.5)
WBC: 18.5 10*3/uL — AB (ref 4.0–10.5)

## 2017-10-24 LAB — GLUCOSE, CAPILLARY
Glucose-Capillary: 108 mg/dL — ABNORMAL HIGH (ref 70–99)
Glucose-Capillary: 105 mg/dL — ABNORMAL HIGH (ref 70–99)
Glucose-Capillary: 111 mg/dL — ABNORMAL HIGH (ref 70–99)
Glucose-Capillary: 129 mg/dL — ABNORMAL HIGH (ref 70–99)
Glucose-Capillary: 90 mg/dL (ref 70–99)

## 2017-10-24 MED ORDER — PRO-STAT SUGAR FREE PO LIQD
30.0000 mL | Freq: Two times a day (BID) | ORAL | Status: DC
  Administered 2017-10-24 – 2017-10-27 (×7): 30 mL via ORAL
  Filled 2017-10-24 (×7): qty 30
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
## 2017-10-24 NOTE — Consult Note (Signed)
WOC Nurse ostomy follow up Stoma type/location: RLQ ileostomy. Pouch was changed last night and is intact at this time.   Gastrostomy tube remains open to drain and bedside drainage with 300 cc brown effluent noted.  Is preparing to work with therapy this morning and states he feels "pretty good".  No further WOC needs at this time. Additional supplies on nightstand.  WOC team will follow.  Maple Hudson MSN, RN, FNP-BC CWON Wound, Ostomy, Continence Nurse Pager [phone]

## 2017-10-24 NOTE — Progress Notes (Signed)
Physical Therapy Session Note  Patient Details  Name: Philip Thompson MRN: [id number] Date of Birth: 02/21/1990  Today's Date: 10/24/2017 PT Individual Time: 0900-1000 abd 1420-1530 PT Individual Time Calculation (min): 60 min and 70 min  Short Term Goals: Week 1:  PT Short Term Goal 1 (Week 1): =LTG due to ELOS  Skilled Therapeutic Interventions/Progress Updates: Pt presented in bed agreeable to therapy. Performed bed mobility with supervision and increased time. Ambulated to rehab gym with CGA holding G/J drain bag. Pt noted initially to have narrow BOS and occurrence of scissoring with near LOB during ambulation. Pt participated in balance/coordination activities including toe taps, toe taps to target, standing on Airex with min/mod permutations, eyes open/closed, and shoulder flexion with eyes open/closed. Pt noted to have x 1 LOB posteriorly however pt was able to recover. Participated in obstacle course weaving through cones, onto steps, and over thresholds. Pt initially required CGA and progressed to close S with moderate challenges. Pt ambulated to day room and participated in Biodex LOS on static and level 12 as well as catch game on level 12. Pt then participated in NuStep L4 x 4 min with c/o increased LBP. Pt indicating that NuStep causing increased pain at G/J insertion site and LBP. Pt ambulated back to room close S at decreased gait speed however no deviations in balance. PTA notified pt of increased pain and provided hot pack to apply to low back. Pt left in bed with nsg present and current needs met.   Tx2: Pt presented in bed agreeable to therapy. Pt stating decreased pain in LB, no numerical number given. Performed bed mobility with supervision and pt requesting to urinate. Use of urinal and pt holding bed rail. Pt ambulated throughout facility no AD to South entrance patio without rest. Pt ascended stairs from 1st to second floor in atrium with R rail CGA. Continued ambulation to  front entrance of hospital around patio, to parking garage, and through staff entrance with x 1 seated rest break. Pt initially CGA and progressed to close supervision without AD. Upon return to rehab gym pt participated in rocker board a-p and laterally without use of parallel bars for increased ankle strategy. Participated in agility ladder for balance and coordination initially requiring CGA however improved once shoes removed. Pt also participated in BLE therex including mini squats at parallel bars, hamstring curls, heel rises x 20 bilaterally for BLE strengthening. Pt ambulated back to room and returned to bed in same manner as prior. Pt left in bed with bed alarm on, call bell within reach, and nsg present to connect feeding tube.      Therapy Documentation Precautions:  Precautions Precautions: Fall Precaution Comments: /gJ tubel IV right arm Other Brace/Splint: PEG tube  ostomy Restrictions Weight Bearing Restrictions: No General:   Vital Signs:  Pain: Pain Assessment Pain Scale: Faces Pain Score: 5  Faces Pain Scale: Hurts a little bit Pain Type: Chronic pain Pain Location: Back Pain Orientation: Lower Pain Descriptors / Indicators: Aching Pain Onset: With Activity Pain Intervention(s): Heat applied;Repositioned    Therapy/Group: Individual Therapy      , PTA  10/24/2017, 1:00 PM  

## 2017-10-24 NOTE — Progress Notes (Signed)
NT reported that patient has candy and juice in the room during shift report. Dr. Allena Katz made aware. Education will be provided.

## 2017-10-24 NOTE — Progress Notes (Signed)
Occupational Therapy Session Note  Patient Details  Name: Philip Thompson MRN: [id number] Date of Birth: [birth year]  Today's Date: 10/24/2017 OT Individual Time: 1102-1200 OT Individual Time Calculation (min): 58 min    Short Term Goals: Week 1:  OT Short Term Goal 1 (Week 1): Pt will be mod I with grooming standing level. OT Short Term Goal 2 (Week 1): Pt will be mod I with bathing in sitting and in standing OT Short Term Goal 3 (Week 1): Pt will be mod I with grooming activities OT Short Term Goal 4 (Week 1): Pt will be mod I with simple meal prep OT Short Term Goal 5 (Week 1): Pt will verbalize steps for caring for ostomy bag  Skilled Therapeutic Interventions/Progress Updates:    Pt completed functional mobility down to the day room with supervision.  He worked on Tree surgeon with use of the Wii balance board during various activities.  He demonstrates slower than normal balance reactions at times, requiring min assist to correct.  He was able to exhibit some appropriate ankle and hip strategies for 75% of task, but did flex knees for compensation to help promote weight shifting.  Returned back to room at end of activity with pt requesting ice chips.  Therapist provided them based on orders.  Pt left up in the wheelchair with heat pack on his lower back, which was present prior to session.  Chair alarm in place and call button and phone in reach.    Therapy Documentation Precautions:  Precautions Precautions: Fall Precaution Comments: /gJ tubel IV right arm Other Brace/Splint: PEG tube  ostomy Restrictions Weight Bearing Restrictions: No   Pain: Pain Assessment Pain Scale: Faces Pain Score: 5  Faces Pain Scale: Hurts a little bit Pain Type: Chronic pain Pain Location: Back Pain Orientation: Lower Pain Descriptors / Indicators: Aching Pain Onset: With Activity Pain Intervention(s): Heat applied;Repositioned ADL: See Care Plan Section for  Details  Therapy/Group: Individual Therapy  Hadasah Brugger OTR/L 10/24/2017, 12:46 PM

## 2017-10-25 ENCOUNTER — Ambulatory Visit (HOSPITAL_COMMUNITY): Payer: Self-pay | Admitting: Licensed Clinical Social Worker

## 2017-10-25 ENCOUNTER — Inpatient Hospital Stay (HOSPITAL_COMMUNITY): Payer: Self-pay | Admitting: Physical Therapy

## 2017-10-25 ENCOUNTER — Inpatient Hospital Stay (HOSPITAL_COMMUNITY): Payer: Self-pay

## 2017-10-25 ENCOUNTER — Inpatient Hospital Stay (HOSPITAL_COMMUNITY): Payer: Self-pay | Admitting: Occupational Therapy

## 2017-10-25 LAB — CBC WITH DIFFERENTIAL/PLATELET
ABS IMMATURE GRANULOCYTES: 0.1 10*3/uL — AB (ref 0.00–0.07)
Basophils Absolute: 0.1 10*3/uL (ref 0.0–0.1)
Basophils Relative: 0 %
Eosinophils Absolute: 0.3 10*3/uL (ref 0.0–0.5)
Eosinophils Relative: 2 %
HCT: 37.3 % — ABNORMAL LOW (ref 39.0–52.0)
Hemoglobin: 11.8 g/dL — ABNORMAL LOW (ref 13.0–17.0)
Immature Granulocytes: 1 %
Lymphocytes Relative: 12 %
Lymphs Abs: 1.8 10*3/uL (ref 0.7–4.0)
MCH: 28.5 pg (ref 26.0–34.0)
MCHC: 31.6 g/dL (ref 30.0–36.0)
MCV: 90.1 fL (ref 80.0–100.0)
MONO ABS: 1.7 10*3/uL — AB (ref 0.1–1.0)
MONOS PCT: 12 %
NEUTROS ABS: 10.7 10*3/uL — AB (ref 1.7–7.7)
Neutrophils Relative %: 73 %
Platelets: 200 10*3/uL (ref 150–400)
RBC: 4.14 MIL/uL — ABNORMAL LOW (ref 4.22–5.81)
RDW: 13.3 % (ref 11.5–15.5)
WBC: 14.6 10*3/uL — ABNORMAL HIGH (ref 4.0–10.5)

## 2017-10-25 LAB — BASIC METABOLIC PANEL
ANION GAP: 7 (ref 5–15)
BUN: 14 mg/dL (ref 6–20)
CALCIUM: 9.3 mg/dL (ref 8.9–10.3)
CO2: 32 mmol/L (ref 22–32)
Chloride: 93 mmol/L — ABNORMAL LOW (ref 98–111)
Creatinine, Ser: 0.64 mg/dL (ref 0.61–1.24)
GFR calc Af Amer: >60 mL/min (ref >60)
GFR calc non Af Amer: >60 mL/min (ref >60)
Glucose, Bld: 91 mg/dL (ref 70–99)
Potassium: 4.4 mmol/L (ref 3.5–5.1)
Sodium: 132 mmol/L — ABNORMAL LOW (ref 135–145)

## 2017-10-25 LAB — GLUCOSE, CAPILLARY
GLUCOSE-CAPILLARY: 101 mg/dL — AB (ref 70–99)
GLUCOSE-CAPILLARY: 114 mg/dL — AB (ref 70–99)
GLUCOSE-CAPILLARY: 81 mg/dL (ref 70–99)
Glucose-Capillary: 127 mg/dL — ABNORMAL HIGH (ref 70–99)
Glucose-Capillary: 143 mg/dL — ABNORMAL HIGH (ref 70–99)

## 2017-10-25 NOTE — Progress Notes (Signed)
Occupational Therapy Discharge Summary  Patient Details  Name: NYLES MITTON MRN: [id number] Date of Birth: 05-06-1990  Today's Date: 10/25/2017 OT Individual Time: 1517-6160 OT Individual Time Calculation (min): 43 min  30 missed minutes secondary to fatigue and increased pain  Patient has met 10 of 10 long term goals due to improved activity tolerance, improved balance and ability to compensate for deficits.  Patient to discharge at overall Modified Independent level.  Patient's care partner is independent to provide the necessary physical and cognitive assistance at discharge.    Reasons goals not met: NA  Recommendation:  No further OT needs at this time as pt is modified independent to independent for selfcare tasks and functional transfers at this time.    Equipment: No equipment provided  Reasons for discharge: treatment goals met and discharge from hospital  Patient/family agrees with progress made and goals achieved: Yes   OT Intervention: Upon entering the room, pt supine in bed with mother present and pt with 7/10 low back pain reports. RN notified. OT placed heat on lower back for pain management. Pt is mod I in the room and OT providing education to pt for safety with that achievement. Pt verbalized understanding. Pt falling asleep and difficult to arouse for participation. OT discussing pt's progress, recommendations for safety at home, and need for no follow up OT services at discharge with mother. She had several questions in which therapist answered until no further questions needed. OT educating caregiver on energy conservation for pt at home as well. RN present with medication and caregiver needed to participate in medication/PEG education in preparation for discharge. Pt remains sleeping soundly at this time with all needs within reach.   OT Discharge Precautions/Restrictions  Precautions Precaution Comments: g tube, j tube, clear liquid diet Other Brace/Splint:  PEG tube  ostomy Restrictions Weight Bearing Restrictions: No   Vital Signs Therapy Vitals Temp: 98.8 F (37.1 C) Temp Source: Oral Pulse Rate: (!) 102 Resp: 17 BP: 94/60 Patient Position (if appropriate): Sitting Oxygen Therapy SpO2: 100 % O2 Device: Room Air Pain Pain Assessment Pain Scale: 0-10 Pain Score: 7  Pain Type: Acute pain Pain Location: Back Pain Orientation: Lower Pain Descriptors / Indicators: Aching Pain Frequency: Intermittent Pain Onset: On-going Patients Stated Pain Goal: 1 Pain Intervention(s): Repositioned;Heat applied;RN made aware Multiple Pain Sites: No ADL   Vision Baseline Vision/History: No visual deficits Patient Visual Report: No change from baseline Vision Assessment?: No apparent visual deficits Perception  Perception: Within Functional Limits Praxis Praxis: Intact Cognition Overall Cognitive Status: History of cognitive impairments - at baseline Arousal/Alertness: Awake/alert Attention: Selective Selective Attention: Appears intact Memory: Appears intact Awareness: Impaired Awareness Impairment: Anticipatory impairment Problem Solving: Impaired Problem Solving Impairment: Functional complex Safety/Judgment: Impaired Comments: Pt with history of frontal TBI which he reports changed his personality.  Feel he is at baseline from previous injury.  Sensation Sensation Light Touch: Appears Intact Hot/Cold: Appears Intact Proprioception: Appears Intact Stereognosis: Appears Intact Coordination Gross Motor Movements are Fluid and Coordinated: Yes Fine Motor Movements are Fluid and Coordinated: Yes Motor  Motor Motor - Discharge Observations: [place] Mobility  Bed Mobility Rolling Right: Independent Rolling Left: Independent Supine to Sit: Independent Sit to Supine: Independent Transfers Sit to Stand: Independent Stand to Sit: Independent  Trunk/Postural Assessment  Cervical Assessment Cervical Assessment: Within Functional  Limits Thoracic Assessment Thoracic Assessment: Exceptions to WFL(slight thoracic rounding noted secondary to weakness) Lumbar Assessment Lumbar Assessment: Exceptions to WFL(Posterior pelvic tilt noted)  Balance Balance Balance Assessed:  Yes Static Sitting Balance Static Sitting - Balance Support: No upper extremity supported;Feet supported Static Sitting - Level of Assistance: 7: Independent Dynamic Sitting Balance Dynamic Sitting - Balance Support: During functional activity Dynamic Sitting - Level of Assistance: 7: Independent Static Standing Balance Static Standing - Balance Support: No upper extremity supported Static Standing - Level of Assistance: 7: Independent Dynamic Standing Balance Dynamic Standing - Balance Support: During functional activity Dynamic Standing - Level of Assistance: 6: Modified independent (Device/Increase time) Extremity/Trunk Assessment RUE Assessment RUE Assessment: Within Functional Limits General Strength Comments: AROM WFLS for all joints, strength 4/5 throughout grossly LUE Assessment LUE Assessment: Within Functional Limits General Strength Comments: AROM WFLS for all joints, strength 4/5 throughout grossly   MCGUIRE,JAMES OTR/L 10/25/2017, 5:46 PM

## 2017-10-25 NOTE — Progress Notes (Signed)
[facility] PHYSICAL MEDICINE & REHABILITATION PROGRESS NOTE  Subjective/Complaints: Patient seen laying in bed this morning.  Father at bedside.  Patient states he slept well overnight.  He states he is still sleepy.  ROS: Denies CP, SOB, nausea, vomiting, diarrhea.  Objective: Vital Signs: Blood pressure (!) 106/53, pulse 97, temperature 98.3 F (36.8 C), temperature source Oral, resp. rate 18, height 5\' 8"  (1.727 m), weight 66 kg, SpO2 98 %. No results found. Recent Labs    10/24/17 0710 10/25/17 0424  WBC 18.5* 14.6*  HGB 11.8* 11.8*  HCT 36.5* 37.3*  PLT 203 200   Recent Labs    10/24/17 0710 10/25/17 0424  NA 128* 132*  K 4.7 4.4  CL 94* 93*  CO2 28 32  GLUCOSE 93 91  BUN 15 14  CREATININE 0.60* 0.64  CALCIUM 9.0 9.3    Physical Exam: BP (!) 106/53 (BP Location: Left Arm)   Pulse 97   Temp 98.3 F (36.8 C) (Oral)   Resp 18   Ht 5\' 8"  (1.727 m)   Wt 66 kg   SpO2 98%   BMI 22.12 kg/m  Constitutional: NAD.  Frail. HENT: Normocephalic and atraumatic.  Eyes: EOMI.  No discharge. Neck: Dressing to stoma C/C/I Cardiovascular: RRR.  No JVD. Respiratory: Effort normal.  Clear. GI: Bowel sounds are normal. He exhibits no distension. Ileostomy which sealed.  + G/J tube  Musculoskeletal: He exhibits no edema and no tenderness.  Neurological: He is alert and oriented Arousable.  Follows simple commands with reasonable insight.  Motor: 4/5 grossly throughout, stable Skin: Skin is warm. See above.  Incision/tube C/D/I Psychiatric: Affect improving  Assessment/Plan: 1. Functional deficits secondary to debility which require 3+ hours per day of interdisciplinary therapy in a comprehensive inpatient rehab setting.  Physiatrist is providing close team supervision and 24 hour management of active medical problems listed below.  Physiatrist and rehab team continue to assess barriers to discharge/monitor patient progress toward functional and medical goals  Care  Tool:  Bathing              Bathing assist       Upper Body Dressing/Undressing Upper body dressing   What is the patient wearing?: Pull over shirt    Upper body assist Assist Level: Set up assist    Lower Body Dressing/Undressing Lower body dressing      What is the patient wearing?: Pants     Lower body assist Assist for lower body dressing: Set up assist     Toileting Toileting    Toileting assist Assist for toileting: Independent with assistive device     Transfers Chair/bed transfer  Transfers assist  Chair/bed transfer activity did not occur: N/A  Chair/bed transfer assist level: Supervision/Verbal cueing     Locomotion Ambulation   Ambulation assist   Ambulation activity did not occur: N/A  Assist level: Supervision/Verbal cueing Assistive device: Walker-rolling Max distance: 150ft   Walk 10 feet activity   Assist     Assist level: Supervision/Verbal cueing Assistive device: Walker-rolling   Walk 50 feet activity   Assist    Assist level: Supervision/Verbal cueing Assistive device: Walker-rolling    Walk 150 feet activity   Assist Walk 150 feet activity did not occur: Safety/medical concerns  Assist level: Supervision/Verbal cueing      Walk 10 feet on uneven surface  activity   Assist Walk 10 feet on uneven surfaces activity did not occur: Safety/medical concerns   Assist level: Supervision/Verbal cueing  Wheelchair     Assist Will patient use wheelchair at discharge?: No        Max wheelchair distance: 161ft    Wheelchair 50 feet with 2 turns activity    Assist            Wheelchair 150 feet activity     Assist          Medical Problem List and Plan:  1. 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  Bilateral PE/Anticoagulation: Subcutaneous Lovenox transitioned to Eliquis 10/21/2017 status post IVC filter 10/02/2017  3. Pain Management: Neurontin 300 mg every 12 hours, oxycodone as needed  4. Mood: BuSpar 10 mg twice daily, Celexa 20 mg daily, Klonopin 1 mg every 8 hours, Inderal 40 mg 3 times daily, Seroquel 200 mg nightly as well as PRN, trazodone 100 mg nightly, valproic acid 500 mg twice daily   Appreciate neuropsych   Optimize sleep patterns   Team to provide positive reinforcement   Affect improved 5. Neuropsych: This patient is not fully capable of making decisions on his own behalf.  6. Skin/Wound Care: Routine skin checks  7. Fluids/Electrolytes/Nutrition: Strict in and outs   TF through J port   G port to suction   May have ice chips  8. VDRF: Status post tracheostomy decannulated 10/20/2017   Occlusive dressing in place, wound closing  10. Hypothyroidism. Synthroid  11. Tobacco and polysubstance abuse. Continue NicoDerm patch. Provide counseling  12.  Hyponatremia  Sodium 132 on 10/8  Continue to monitor 13.  Labile blood glucose  Controlled on 10/8 14.  Hypoalbuminemia  Supplement initiated on 10/7 15.  Leukocytosis  WBC is 14.6 on 10/8, improving  Afebrile, no signs of infection 16.  Acute blood loss anemia  Hemoglobin 11.8 on 10/8  Cont to monitor  LOS: 4 days A FACE TO FACE EVALUATION WAS PERFORMED  Ankit Karis Juba 10/25/2017, 8:23 AM

## 2017-10-25 NOTE — Progress Notes (Signed)
Nutrition Follow-up  DOCUMENTATION CODES:   Severe malnutrition in context of chronic illness  INTERVENTION:   Continue tube Feeding via J-port of G-J tube:  - Pivot 1.5 @ 80 ml/hr (1600 ml/day) for 20 hours  Provides 2400kcal, 150grams of protein, and 12102m free water. Meets 100% estimated kcal and protein needs.  - Continue Pro-stat 30 ml BID PO, each supplement provides 100 kcal and 15 grams of protein  Recommend continuing tube feeds at goal rate until pt has demonstrated ability to tolerate POs. At that time, recommend switching to nocturnal tube feeds and continuing this until pt is able to meet 100% of his needs via POs.  NUTRITION DIAGNOSIS:   Severe Malnutrition related to chronic illness (CM, chronic constipation/obstipation with megacolon, chronic substance abuse) as evidenced by severe fat depletion, severe muscle depletion.  Ongoing  GOAL:   Patient will meet greater than or equal to 90% of their needs  Met via TF regimen  MONITOR:   TF tolerance, Diet advancement, Labs, Weight trends, Skin  REASON FOR ASSESSMENT:   Consult Enteral/tube feeding initiation and management  ASSESSMENT:   27[facility] with severe obstructive constipation, severely dilated colon and colitis requiring total colectomy with ileostomy on 9/10 and ultimately required G-J tube for nutrition; pt also with bilateral PE on admission, on vent support ultimately requiring trach placement. PMH includes polysubstance abuse, drug-induced cardiomyopathy, anxiety, depression, chronic constipation/obstipation  9/10 - total colectomy with ileostomy,vent 9/11-PICC line inserted 9/12-TPN initiated, extubated 9/13- re-Intubated 9/16- trickle feedings started 9/18- trach placed, Cortrak  9/21-TPN re-started, NG tube for decompression, TF held 9/23- diagnostic Radiology advanced Cortrak tube to post-pyloric position,JP drain removed 9/26-  abdominal xray confirms Cortrak tube in transverse duodenum, NG tube in stomach 9/27-TPN discontinued, TF to goal rate 9/30-IR placed G-tube 10/1 - capping trials started, tolerated well 10/2 - IR converted G-tube to G-J tube, Cortrak removed 10/3 - trach decannulated  Noted pt with 2.4 lb net weight gain since admission to rehab.  Spoke with pt at bedside. Pt resting in wheelchair and on the phone at time of visit.  Pt in good spirits, stating that he is finally able to have clear liquids. Pt is looking forward to his first meal tray this evening.  Pt states that he is likely going home Thursday and requested education for him and his mom regarding G-J tube use. RD assured pt that this education would be provided.  Pt states that he is tolerating the Pivot 1.5 formula well, stating, "I haven't noticed a difference." TF not infusing at time of visit as pt had just returned from therapies.  Pt requesting pain medication. RD alerted RN.  Medications reviewed and include: Pepcid 20 mg BID, levothyroxine 75 mcg daily  Labs reviewed: sodium 132 (L), hemoglobin 11.8 (L), HCT 37.3 (L) CBG's: 101, 127, 143, 90, 129 x 24 hours  UOP: 2625 ml x 24 hours G-tube output: 850 ml x 24 hours Ostomy output: 850 ml x 24 hours I/O's: -12 L since admit  Diet Order:   Diet Order            Diet clear liquid Room service appropriate? Yes; Fluid consistency: Thin  Diet effective now              EDUCATION NEEDS:   Not appropriate for education at this time  Skin:  Skin Assessment: Stage III: intragluteal (coccygeal) improving, dry, healing scabbed area without depth or drainage Incisions:  abdomen (surgical)  Last BM:  + stool via ostomy  Height:   Ht Readings from Last 1 Encounters:  10/21/17 '5\' 8"'  (1.727 m)    Weight:   Wt Readings from Last 1 Encounters:  10/25/17 66 kg    Ideal Body Weight:  70 kg  BMI:  Body mass index is 22.12 kg/m.  Estimated Nutritional Needs:    Kcal:  0109-3235 kcals   Protein:  130-150 g  Fluid:  >/= 2L     Philip Face, MS, RD, LDN Inpatient Clinical Dietitian Pager: [phone] Weekend/After Hours: [phone]

## 2017-10-25 NOTE — Progress Notes (Signed)
Occupational Therapy Session Note  Patient Details  Name: Philip Thompson MRN: [id number] Date of Birth: 03-19-1990  Today's Date: 10/25/2017 OT Individual Time: 1300-1330 OT Individual Time Calculation (min): 30 min    Short Term Goals: Week 1:  OT Short Term Goal 1 (Week 1): Pt will be mod I with grooming standing level. OT Short Term Goal 2 (Week 1): Pt will be mod I with bathing in sitting and in standing OT Short Term Goal 3 (Week 1): Pt will be mod I with grooming activities OT Short Term Goal 4 (Week 1): Pt will be mod I with simple meal prep OT Short Term Goal 5 (Week 1): Pt will verbalize steps for caring for ostomy bag  Skilled Therapeutic Interventions/Progress Updates:    1:1 continued focus on cardiopulmonary endurance and dynamic balance. Performed limits of stability and maze control with static and dynamic platform (at 7 and then 3). Pt requires more time to complete tasks with 5-25% accuracy.  Transitioned to arm bike for 3 min and then to the kinetron in standing for 3 min without UE support. PT does require rest breaks inbetween activities but able to complete with stand by supervision. Pt demonstrate functional ambulation around unit at mod I.  Therapy Documentation Precautions:  Precautions Precautions: Fall Precaution Comments: /gJ tubel IV right arm Other Brace/Splint: PEG tube  ostomy Restrictions Weight Bearing Restrictions: No Pain:   no c/o pain at this time; reported back was hurting earlier but able to continue.   Therapy/Group: Individual Therapy  Roney Mans [facility] 10/25/2017, 2:00 PM

## 2017-10-25 NOTE — Evaluation (Signed)
Speech Language Pathology Assessment and Plan  Patient Details  Name: Philip Thompson MRN: [id number] Date of Birth: October 30, 1990   Today's Date: 10/25/2017 SLP Individual Time: 5093-2671 SLP Individual Time Calculation (min): 10 min   Problem List:  Patient Active Problem List   Diagnosis Date Noted  . Acute blood loss anemia   . Labile blood glucose   . Hypoglycemia   . Hyponatremia   . Debility 10/21/2017  . Anxiety and depression   . Protein calorie malnutrition ([place])   . Tracheostomy care ([place])   . Tracheostomy in place [facility])   . History of traumatic brain injury   . Anoxic brain injury ([place])   . Chronic pain syndrome   . Sinus tachycardia   . Tachypnea   . Leukocytosis   . Pressure injury of skin 10/08/2017  . Acute respiratory failure with hypoxemia ([place])   . Protein-calorie malnutrition, severe 09/28/2017  . Preoperative cardiovascular examination   . Acute respiratory failure with hypoxia ([place]) 09/22/2017  . Acute pulmonary embolism without acute cor pulmonale ([place]) 09/22/2017  . Hypothyroidism 09/22/2017  . Colon distention   . 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 Thrombocytopenia ([place]) 09/18/2017  . Hemoptysis 09/18/2017  . Megacolon 09/18/2017  . Fecal impaction ([place])   . Impaction of colon ([place])   . Obstipation 09/16/2017  . Constipation   . Leg edema   . Acute megacolon ([place]) 09/15/2017  . Hypokalemia 09/15/2017  . Tobacco use 09/15/2017  . Traumatic brain injury ([place]) 04/19/2017  . Mild cognitive disorder 04/19/2017  . MDD (major depressive disorder), recurrent severe, without psychosis ([place]) 03/25/2017  . Polysubstance abuse ([place]) 10/15/2016  . Substance or medication-induced bipolar and related disorder with onset during intoxication ([place]) 07/05/2016  . Opioid use disorder, moderate, dependence ([place]) 07/05/2016  . Cannabis use disorder, severe, dependence ([place]) 07/05/2016  . Chronic back pain 07/05/2012   Past Medical History:  Past Medical  History:  Diagnosis Date  . ADD (attention deficit disorder)   . Aggression   . Allergic rhinitis   . Anxiety   . Arthritis    joint pain  . Cardiomyopathy ([place])    a. EF <25% in 2015 during admission for multi substance intoxication/respiratory failure requiring intubation.  . Chronic back pain   . Constipation   . Depression   . Fecal obstruction ([place])   . Frontal head injury   . Heroin overdose ([place])   . Obstipation   . Polysubstance abuse ([place])   . Suicide attempt [facility])    Past Surgical History:  Past Surgical History:  Procedure Laterality Date  . COLECTOMY N/A 09/27/2017   Procedure: SUBTOTAL COLECTOMY AND ILEOSTOMY;  Surgeon: Coralie Keens, MD;  Location: [place];  Service: General;  Laterality: N/A;  . FOOT SURGERY     to get a BB out  . IR GASTR TUBE CONVERT GASTR-JEJ PER W/FL MOD SED  10/19/2017  . IR GASTROSTOMY TUBE MOD SED  10/17/2017  . IR IVC FILTER PLMT / S&I /IMG GUID/MOD SED  10/02/2017  . NO PAST SURGERIES      Assessment / Plan / Recommendation Clinical Impression   Philip Thompson is a 27 year old right-handed male with history of MDD, multiple TBI as well as anoxic BI, chronic pain, SI, polysubstance abuse, chronic constipation; who was admitted to [facility] on 09/21/2017 with 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 He was transferred to [facility] for treatment and CT scan done revealing bilateral  PE as well as massive distention of colon. Dr. Marlou Sa was consulted for input and recommended neostigmine trial with monitoring. He was started on IV heparin for PE and and cardiology consulted due to reports of chest pain. Pain felt to be noncardiac in nature and and maintained on anticoagulation initially with Xarelto but held due to drain site bleeding and an IVC filter was placed 10/02/2017 per interventional radiology with recommendations to retrieve filter after 4 weeks.. Currently maintained on subcutaneous Lovenox and  transitioned to Eliquis 10/21/2017. As he had no improvement in colonic inertia, he was taken to the OR for subtotal colectomy and ileostomy on zero 9/10 by Dr. Ninfa Linden. He required intubation and sedation as well as NG tube due to high volume output as well as TPN for nutritional support. He has had issues with anxiety, agitation and difficulty with vent wean and difficulty with vent wean requiring tracheostomy on 9/18 by Dr. Nelda Marseille and decannulated 10/20/2017.  Psychiatry consulted for input due to suicidal ideation with worsening of anxiety and poor sleep. Initially maintained on Precedex for agitation weaned off 10/10/2017. Multiple medication changes recommended and psych has signed off as "no evidence of imminent risk to self or others noted." He continued to have high volume output from NG tube and gastrostomy tube was placed by Dr. Carman Ching on 9/30. He was noted to have high output through G-tube to gravity therefore tube feeds resumed via cortak and gastric tube converted to [place] tube on 10/2. It has been discussed at length that continued feeds are through J-tube and gravity bag maintained to G-tube.  Continues to be deconditioned requiring multiple rest breaks as well as tachycardia noted with activity. CIR recommended due to debility.  SLP evaluation was completed on 10/25/2017 to assess pt's readiness to begin limited POs.   Pt presents with grossly intact oropharyngeal swallowing function at bedside.  [name] oral phase was efficient for containment, transit, and clearance of boluses and swallow response was swift and timely.  No overt s/s of aspiration were evident with thin liquids despite being challenged with multiple consecutive sips.  As a result, pt appears appropriate to initiate a clear liquids diet (per surgery) once cleared by MD.  No further ST needs are indicated at this time given that pt is currently unable to be advanced beyond clear liquids; however, should pt have solid food dysphagia once  cleared for solids feel free to reconsult ST as an outpatient.    Skilled Therapeutic Interventions          Bedside swallow evaluation completed with results and recommendations reviewed with patient.     SLP Assessment  Patient does not need any further Speech Lanaguage Pathology Services    Recommendations  SLP Diet Recommendations: No solids, see liquids;Thin Liquid Administration via: Cup;Straw Medication Administration: Via alternative means Supervision: Patient able to self feed Compensations: Slow rate;Small sips/bites Postural Changes and/or Swallow Maneuvers: Seated upright 90 degrees;Upright 30-60 min after meal Oral Care Recommendations: Oral care BID Follow up Recommendations: None Equipment Recommended: None recommended by SLP           Pain Pain Assessment Pain Scale: 0-10 Pain Score: 0-No pain  Prior Functioning Cognitive/Linguistic Baseline: Within functional limits Type of Home: House  Lives With: Family Available Help at Discharge: Family Education: HS    Recommendations for other services: None   Discharge Criteria: Patient will be discharged from SLP if patient refuses treatment 3 consecutive times without medical reason, if treatment goals not met, if there  is a change in medical status, if patient makes no progress towards goals or if patient is discharged from hospital.  The above assessment, treatment plan, treatment alternatives and goals were discussed and mutually agreed upon: by patient  Emilio Math 10/25/2017, 8:32 PM

## 2017-10-25 NOTE — Progress Notes (Signed)
Occupational Therapy Session Note  Patient Details  Name: Philip Thompson MRN: [id number] Date of Birth: 07-Nov-1990  Today's Date: 10/25/2017 OT Individual Time: 1350-1445 OT Individual Time Calculation (min): 55 min    Short Term Goals: Week 1:  OT Short Term Goal 1 (Week 1): Pt will be mod I with grooming standing level. OT Short Term Goal 2 (Week 1): Pt will be mod I with bathing in sitting and in standing OT Short Term Goal 3 (Week 1): Pt will be mod I with grooming activities OT Short Term Goal 4 (Week 1): Pt will be mod I with simple meal prep OT Short Term Goal 5 (Week 1): Pt will verbalize steps for caring for ostomy bag  Skilled Therapeutic Interventions/Progress Updates:    Pt with ostomy leak to start session so nursing came in and changed ostomy and then pt was able to independently change his shirt and donn his slippers.  Had him complete functional mobility from room out to the 4 north entrance, around the fountain, and then thru the parking deck to the [facility] entrance of the hospital.  He was able to ambulate down 3 flights of steps to reach the ground floor of the parking deck and then ambulated up 2 small sets of steps, all with independence.  Returned to the unit and had pt work on dynamic standing balance with use of the Biodex.  He was able to complete Random Control Program for 93% and 96% for two intervals.  Finished session with return to the room and pt left with call button and phone in reach.    Therapy Documentation Precautions:  Precautions Precautions: Fall Precaution Comments: g tube, j tube, clear liquid diet Other Brace/Splint: PEG tube  ostomy Restrictions Weight Bearing Restrictions: No  Pain: Pain Assessment Pain Scale: 0-10 Pain Score: 8  Pain Type: Chronic pain Pain Location: Back Pain Descriptors / Indicators: Aching Pain Frequency: Intermittent Pain Onset: On-going Patients Stated Pain Goal: 4 Pain Intervention(s): Medication (See  eMAR) ADL: Therapy/Group: Individual Therapy  Safira Proffit OTR/L 10/25/2017, 4:00 PM

## 2017-10-25 NOTE — Progress Notes (Signed)
Physical Therapy Session Note  Patient Details  Name: Philip Thompson MRN: [id number] Date of Birth: 12-12-1990  Today's Date: 10/25/2017 PT Individual Time: 0800-0830 PT Individual Time Calculation (min): 30 min   Short Term Goals: Week 1:  PT Short Term Goal 1 (Week 1): =LTG due to ELOS  Skilled Therapeutic Interventions/Progress Updates:    Pt supine in bed upon PT arrival, agreeable to therapy tx and denies pain. Pt asking about when he will start getting speech therapy to eat, therapist explained she will follow up with doctor. Pt transferred to sitting EOB with supervision and sit<>stand with supervision, ambulated into bathroom with supervision to use urinal. Pt ambulated to the gym without AD, supervision x 252ft. Pt worked on dynamic standing balance this session including standing on airex while tossing ball, backwards walking, sidestepping, tandem walking, gait with changes in speeds, gait with sudden stops, all with supervision-CGA. Pt ambulated back to room and left seated in w/c with chair alarm set and needs in reach.   Therapy Documentation Precautions:  Precautions Precautions: Fall Precaution Comments: /gJ tubel IV right arm Other Brace/Splint: PEG tube  ostomy Restrictions Weight Bearing Restrictions: No    Therapy/Group: Individual Therapy  Cresenciano Genre, PT, DPT 10/25/2017, 7:48 AM

## 2017-10-25 NOTE — Progress Notes (Signed)
Physical Therapy Session Note  Patient Details  Name: Philip Thompson MRN: [id number] Date of Birth: 1990-09-06  Today's Date: 10/25/2017 PT Individual Time: 0900-1000 and 1503-1530 PT Individual Time Calculation (min): 60 min and 30 min  Short Term Goals: Week 1:  PT Short Term Goal 1 (Week 1): =LTG due to ELOS  Skilled Therapeutic Interventions/Progress Updates: Pt presented in bed agreeable to therapy. Pt performed all bed mobility and transfers at supervision level. Pt ambulated to day room and participated in Wii balance penguin slide and ski solomn games. Pt noted to have no LOB with activities. Pt ambulated to ortho gym and participated in trunk stability activities while sitting on physioball. Pt participated in ball toss, alternating shoulder flexion, alternating hip flexion while sitting on ball. Pt was supervision with ball toss, and shoudler flexion and required CGA while performing hip flexion. Pt ambulated to [facility] and then returned to room. All ambulation done at supervision level throughout session. Pt returned to bed at end of session and left with bed alarm on and needs met.   Tx2: Pt presented in w/c with nsg present administering meds and agreeable to therapy. Pt indicated now able to eat clear liquids and looking forward to dinner. PTA then discussed with other therapies and agreeable to make tomorrow Grad day. Pt notified that if cleared anticipate d/c Thur 10/10. Pt pleased with information. Pt ambulated to outside atrium area at distant supervision level. Pt noted to safely negotiate around chairs and tables without increased sway or LOB. Pt returned to room and returned to w/c with current needs met.      Therapy Documentation Precautions:  Precautions Precautions: Fall Precaution Comments: /gJ tubel IV right arm Other Brace/Splint: PEG tube  ostomy Restrictions Weight Bearing Restrictions: No      Therapy/Group: Individual Therapy  Philip Thompson   Philip Thompson, PTA  10/25/2017, 12:45 PM

## 2017-10-25 NOTE — Progress Notes (Signed)
Spoke with general surgery in regards to G-tube output.  Recommendations at this time for SLP evaluation of swallow.  Patient is not to advance past clear liquids.  He will continue with G-tube and J-tube at time of discharge with family education to be provided and follow-up outpatient with general surgery.

## 2017-10-26 ENCOUNTER — Encounter (HOSPITAL_COMMUNITY): Payer: Self-pay | Admitting: Psychology

## 2017-10-26 ENCOUNTER — Inpatient Hospital Stay (HOSPITAL_COMMUNITY): Payer: Self-pay | Admitting: Physical Therapy

## 2017-10-26 ENCOUNTER — Inpatient Hospital Stay (HOSPITAL_COMMUNITY): Payer: Self-pay | Admitting: Occupational Therapy

## 2017-10-26 LAB — BASIC METABOLIC PANEL
Anion gap: 6 (ref 5–15)
BUN: 12 mg/dL (ref 6–20)
CHLORIDE: 96 mmol/L — AB (ref 98–111)
CO2: 29 mmol/L (ref 22–32)
CREATININE: 0.56 mg/dL — AB (ref 0.61–1.24)
Calcium: 9.1 mg/dL (ref 8.9–10.3)
GFR calc Af Amer: >60 mL/min (ref >60)
GFR calc non Af Amer: >60 mL/min (ref >60)
Glucose, Bld: 128 mg/dL — ABNORMAL HIGH (ref 70–99)
Potassium: 4.1 mmol/L (ref 3.5–5.1)
Sodium: 131 mmol/L — ABNORMAL LOW (ref 135–145)

## 2017-10-26 LAB — GLUCOSE, CAPILLARY
GLUCOSE-CAPILLARY: 88 mg/dL (ref 70–99)
Glucose-Capillary: 111 mg/dL — ABNORMAL HIGH (ref 70–99)
Glucose-Capillary: 116 mg/dL — ABNORMAL HIGH (ref 70–99)
Glucose-Capillary: 123 mg/dL — ABNORMAL HIGH (ref 70–99)
Glucose-Capillary: 131 mg/dL — ABNORMAL HIGH (ref 70–99)
Glucose-Capillary: 98 mg/dL (ref 70–99)

## 2017-10-26 MED ORDER — NICOTINE 14 MG/24HR TD PT24
14.0000 mg | MEDICATED_PATCH | Freq: Every day | TRANSDERMAL | Status: DC
  Administered 2017-10-26 – 2017-10-27 (×2): 14 mg via TRANSDERMAL
  Filled 2017-10-26 (×2): qty 1

## 2017-10-26 MED ORDER — BOOST / RESOURCE BREEZE PO LIQD CUSTOM
1.0000 | Freq: Three times a day (TID) | ORAL | Status: DC
  Administered 2017-10-26 – 2017-10-27 (×4): 1 via ORAL

## 2017-10-26 MED ORDER — OSMOLITE 1.5 CAL PO LIQD
1000.0000 mL | ORAL | Status: DC
  Administered 2017-10-26: 1000 mL
  Filled 2017-10-26 (×3): qty 1000

## 2017-10-26 NOTE — Progress Notes (Signed)
Physical Therapy Discharge Summary  Patient Details  Name: Philip Thompson MRN: [id number] Date of Birth: 02/12/1990  Today's Date: 10/26/2017 PT Individual Time:1005-1100   55 min    Patient has met 8 of 8 long term goals due to improved activity tolerance, improved balance, increased strength and decreased pain.  Patient to discharge at an ambulatory level Modified Independent.   Patient's care partner is independent to provide the necessary physical assistance at discharge.  Reasons goals not met: All PT goals met.   Recommendation:  Patient will benefit from ongoing skilled PT services in HHPT to continue to advance safe functional mobility, address ongoing impairments in balance, endurance, safety, and minimize fall risk.  Equipment: No equipment provided  Reasons for discharge: treatment goals met and discharge from hospital  Patient/family agrees with progress made and goals achieved: Yes   PT Treatment:  Pt received sitting in WC and agreeable to PT. PT instructed pt in Grad day assessment to measure progress toward goals. See below for details. Patient returned to room and left sitting in [facility] with call bell in reach and all needs met.       PT Discharge Precautions/Restrictions   Vital Signs Therapy Vitals Pulse Rate: 91 BP: 108/60 Pain Pain Assessment Pain Scale: 0-10 Pain Score: 7  Pain Type: Acute pain Pain Location: Back Pain Orientation: Lower Pain Descriptors / Indicators: Aching Pain Frequency: Intermittent Pain Onset: On-going Patients Stated Pain Goal: 1 Pain Intervention(s): Repositioned;Heat applied;RN made aware Multiple Pain Sites: No Vision/Perception  Perception Perception: Within Functional Limits Praxis Praxis: Intact  Cognition Orientation Level: Oriented to person;Oriented to place;Oriented to situation;Disoriented to time Sensation Sensation Light Touch: Appears Intact Hot/Cold: Appears Intact Proprioception: Appears  Intact Stereognosis: Appears Intact Coordination Gross Motor Movements are Fluid and Coordinated: Yes Fine Motor Movements are Fluid and Coordinated: Yes Coordination and Movement Description: [facility] Motor  Motor Motor: Within Functional Limits Motor - Discharge Observations: [facility]  Mobility Bed Mobility Rolling Right: Independent Rolling Left: Independent Supine to Sit: Independent Sit to Supine: Independent Transfers Transfers: Sit to Stand;Stand to Sit;Stand Pivot Transfers Sit to Stand: Independent Stand to Sit: Independent Stand Pivot Transfers: Independent Transfer (Assistive device): None Locomotion  Gait Ambulation: Yes Gait Assistance: Independent with assistive device Gait Distance (Feet): 200 Feet Assistive device: Other (Comment)(UE supported on IV ) Stairs / Additional Locomotion Stairs: Yes Stairs Assistance: Independent with assistive device Stair Management Technique: One rail Left Number of Stairs: 16 Height of Stairs: 6 Wheelchair Mobility Wheelchair Mobility: No  Trunk/Postural Assessment  Cervical Assessment Cervical Assessment: Within Functional Limits Thoracic Assessment Thoracic Assessment: Exceptions to WFL(slight thoracic rounding noted secondary to weakness) Lumbar Assessment Lumbar Assessment: Exceptions to WFL(Posterior pelvic tilt noted) Postural Control Postural Control: Within Functional Limits  Balance Standardized Balance Assessment Standardized Balance Assessment: Berg Balance Test Berg Balance Test Sit to Stand: Able to stand without using hands and stabilize independently Standing Unsupported: Able to stand safely 2 minutes Sitting with Back Unsupported but Feet Supported on Floor or Stool: Able to sit safely and securely 2 minutes Stand to Sit: Sits safely with minimal use of hands Transfers: Able to transfer safely, minor use of hands Standing Unsupported with Eyes Closed: Able to stand 10 seconds safely Standing Ubsupported with  Feet Together: Able to place feet together independently and stand 1 minute safely From Standing, Reach Forward with Outstretched Arm: Can reach confidently >25 cm (10") From Standing Position, Pick up Object from Floor: Able to pick up shoe safely and easily  From Standing Position, Turn to Look Behind Over each Shoulder: Looks behind from both sides and weight shifts well Turn 360 Degrees: Able to turn 360 degrees safely but slowly Standing Unsupported, Alternately Place Feet on Step/Stool: Able to stand independently and safely and complete 8 steps in 20 seconds Standing Unsupported, One Foot in Front: Able to place foot tandem independently and hold 30 seconds Standing on One Leg: Able to lift leg independently and hold > 10 seconds Total Score: 54 Static Sitting Balance Static Sitting - Level of Assistance: 7: Independent Dynamic Sitting Balance Dynamic Sitting - Level of Assistance: 7: Independent Static Standing Balance Static Standing - Level of Assistance: 6: Modified independent (Device/Increase time) Dynamic Standing Balance Dynamic Standing - Level of Assistance: 6: Modified independent (Device/Increase time) Extremity Assessment      RLE Assessment RLE Assessment: Within Functional Limits General Strength Comments: 5/5 grossly except hips flexion 4+/5  LLE Assessment LLE Assessment: Within Functional Limits General Strength Comments: 5/5 grossly except hip flexion 4+/5    Lorie Phenix 10/26/2017, 10:52 AM

## 2017-10-26 NOTE — Consult Note (Addendum)
WOC Nurse ostomy follow up. Patient seen in 4M11. Mother present. Stoma type/location: RLQ end ileostomy. Stomal assessment/size: Not measured Peristomal assessment: Not assessed Treatment options for stomal/peristomal skin: barrier ring Output: thin brown  Ostomy pouching: 1pc. Cut to fit convex.  Education provided: Patients mother had a question about how often to change the pouch. This was answered and she reviewed the use of the barrier ring. Supplies at bedside, patient anticipated discharge date is 10/10  Helmut Muster, RN, MSN, [employer], CNS-BC, pager [phone]
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
## 2017-10-26 NOTE — Progress Notes (Signed)
Nutrition Follow-up  DOCUMENTATION CODES:   Severe malnutrition in context of chronic illness  INTERVENTION:  - Continue 30 mL Prostat BID, each supplement provides 100 kcal and 15 grams of protein. - Will order Boost Breeze TID, each supplement provides 250 kcal and 9 grams of protein. - Will adjust TF regimen: Osmolite 1.5 @ 90 mL/hr x16 hours/day (5PM-9AM). This regimen will provide 2160 kcal, 90 grams of protein, and 1097 mL free water.   *continue Boost Breeze (or equivalent, such as Ensure Clear) and Prostat (or equivalent) PO following d/c. These supplements, as ordered above, will provide 950 kcal, 57 grams of protein.    NUTRITION DIAGNOSIS:   Severe Malnutrition related to chronic illness(CM, chronic constipation/obstipation with megacolon, chronic substance abuse) as evidenced by severe fat depletion, severe muscle depletion. -ongoing  GOAL:   Patient will meet greater than or equal to 90% of their needs -met with current TF regimen + PO intakes of trays and supplements.   MONITOR:   PO intake, Supplement acceptance, Diet advancement, TF tolerance, Weight trends, Labs, I & O's  REASON FOR ASSESSMENT:   Consult Enteral/tube feeding initiation and management(Question change to bolus tube feeds)  ASSESSMENT:   27 yo male admitted to [facility] on 9/4 for megacolon with severe obstructive constipation, severely dilated colon and colitis requiring total colectomy with ileostomy on 9/10 and ultimately required G-J tube for nutrition; pt also with bilateral PE on admission, on vent support ultimately requiring trach placement. PMH includes polysubstance abuse, drug-induced cardiomyopathy, anxiety, depression, chronic constipation/obstipation  Significant Events: 9/10 - total colectomy with ileostomy,vent 9/11-PICC line inserted 9/12-TPN initiated, extubated 9/13- re-Intubated 9/16- trickle feedings started 9/18- trach placed, Cortrak  9/21-TPN re-started, NG  tube for decompression, TF held 9/23- diagnostic Radiology advanced Cortrak tube to post-pyloric position,JP drain removed 9/26- abdominal xray confirms Cortrak tube in transverse duodenum, NG tube in stomach 9/27-TPN discontinued, TF to goal rate 9/30-IR placed G-tube 10/1 - capping trials started, tolerated well 10/2 - IR converted G-tube to G-J tube, Cortrak removed 10/3 - trach decannulated 10/8 - advanced from NPO to CLD, added Prostat (PO)    Weight +2.6 kg/6 lb from 10/4-10/9. Diet advanced from NPO to CLD yesterday at 1:55 PM and patient reports that he has consumed all PO items provided to him. Lunch 10/8 provided 357 kcal, 2 grams of protein; dinner 10/8 provided 380 kcal, 3 grams of protein; breakfast 10/9 provided 262 kcal, 1 gram of protein. He is ordered 30 mL Prostat BID (oral). Each supplement provides 100 kcal and 15 grams of protein and patient has taken all doses since yesterday.   Talked with patient about adding Boost Breeze and he is very open to this. He denies any abdominal pain/pressure or nausea with PO intakes and denies any difficulties or pain with swallowing. Patient is receiving TF via J-tube. He is receiving Pivot 1.5 @ 80 mL/hr x20 hours. This is providing 2400 kcal, 150 grams of protein, and 1214 mL free water.   Per Dr. Serita Grit note this AM, G-tube to suction. Note also indicates plan to d/c from rehab tomorrow. At this time, patient is not appropriate for TF via G-tube and unable to do bolus TF via J-tube given patient's anatomy.    Medications reviewed; 20 mg Pepcid per tube BID, 75 mcg Synthroid per tube/day.  Labs reviewed; CBGs: 98, 131, and 111 mg/dL today, Na: 131 mmol/L, Cl: 96 mmol/L, creatinine: 0.56 mg/dL.     Diet Order:   Diet Order  Diet clear liquid Room service appropriate? Yes; Fluid consistency: Thin  Diet effective now              EDUCATION NEEDS:   Not appropriate for education at this time  Skin:  Skin  Assessment: Skin Integrity Issues: Stage III: intragluteal (coccygeal) improving, dry, healing scabbed area without depth or drainage Incisions: abdomen (surgical, 10/2)  Last BM:  375 mL via colostomy 10/9  Height:   Ht Readings from Last 1 Encounters:  10/21/17 '5\' 8"'  (1.727 m)    Weight:   Wt Readings from Last 1 Encounters:  10/26/17 67.5 kg    Ideal Body Weight:  70 kg  BMI:  Body mass index is 22.63 kg/m.  Estimated Nutritional Needs:   Kcal:  6701-4103 kcals   Protein:  130-150 g  Fluid:  >/= 2L      Jarome Matin, MS, RD, LDN, [facility] Inpatient Clinical Dietitian Pager # [phone] After hours/weekend pager # [phone]
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
## 2017-10-26 NOTE — Care Management (Signed)
Inpatient Rehabilitation Center Individual Statement of Services  Patient Name:  ODALIS KASEL  Date:  10/26/2017  Welcome to the Inpatient Rehabilitation Center.  Our goal is to provide you with an individualized program based on your diagnosis and situation, designed to meet your specific needs.  With this comprehensive rehabilitation program, you will be expected to participate in at least 3 hours of rehabilitation therapies Monday-Friday, with modified therapy programming on the weekends.  Your rehabilitation program will include the following services:  Physical Therapy (PT), Occupational Therapy (OT), Speech Therapy (ST), 24 hour per day rehabilitation nursing, Neuropsychology, Case Management (Social Worker), Rehabilitation Medicine, Nutrition Services and Pharmacy Services  Weekly team conferences will be held on Wednesdays to discuss your progress.  Your Social Worker will talk with you frequently to get your input and to update you on team discussions.  Team conferences with you and your family in attendance may also be held.  Expected length of stay: 5-7 days   Overall anticipated outcome: supervision  Depending on your progress and recovery, your program may change. Your Social Worker will coordinate services and will keep you informed of any changes. Your Social Worker's name and contact numbers are listed  below.  The following services may also be recommended but are not provided by the Inpatient Rehabilitation Center:   Driving Evaluations  Home Health Rehabiltiation Services  Outpatient Rehabilitation Services  Vocational Rehabilitation   Arrangements will be made to provide these services after discharge if needed.  Arrangements include referral to agencies that provide these services.  Your insurance has been verified to be:  [employer] Your primary doctor is:  [place]  Pertinent information will be shared with your doctor and your insurance company.  Social Worker:   [place], [place] [phone] or ([phone]   Information discussed with and copy given to patient by: Amada Jupiter, 10/26/2017, 9:57 AM

## 2017-10-26 NOTE — Discharge Summary (Signed)
Discharge summary job [id number]

## 2017-10-26 NOTE — Progress Notes (Signed)
[place] PHYSICAL MEDICINE & REHABILITATION PROGRESS NOTE  Subjective/Complaints: Patient seen sitting up in his chair this morning.  Mother at bedside.  He states he slept well overnight.  Mother has questions regarding need for supervision at discharge..  ROS: Denies CP, SOB, nausea, vomiting, diarrhea.  Objective: Vital Signs: Blood pressure 108/60, pulse 91, temperature 98.2 F (36.8 C), temperature source Oral, resp. rate 17, height 5\' 8"  (1.727 m), weight 67.5 kg, SpO2 97 %. No results found. Recent Labs    10/24/17 0710 10/25/17 0424  WBC 18.5* 14.6*  HGB 11.8* 11.8*  HCT 36.5* 37.3*  PLT 203 200   Recent Labs    10/25/17 0424 10/26/17 0615  NA 132* 131*  K 4.4 4.1  CL 93* 96*  CO2 32 29  GLUCOSE 91 128*  BUN 14 12  CREATININE 0.64 0.56*  CALCIUM 9.3 9.1    Physical Exam: BP 108/60 (BP Location: Left Arm)   Pulse 91   Temp 98.2 F (36.8 C) (Oral)   Resp 17   Ht 5\' 8"  (1.727 m)   Wt 67.5 kg   SpO2 97%   BMI 22.63 kg/m  Constitutional: NAD.  Frail. HENT: Normocephalic and atraumatic.  Eyes: EOMI.  No discharge. Neck: Dressing to stoma C/C/I Cardiovascular: RRR.  No JVD. Respiratory: Effort normal.  Clear. GI: Bowel sounds are normal. He exhibits no distension. Ileostomy which sealed.  + G/J tube  Musculoskeletal: He exhibits no edema and no tenderness.  Neurological: He is alert and oriented Arousable.  Follows simple commands with reasonable insight.  Motor: 4/5 grossly throughout, stable Skin: Skin is warm. See above.  Incision/tube C/D/I Psychiatric: Affect improving  Assessment/Plan: 1. Functional deficits secondary to debility which require 3+ hours per day of interdisciplinary therapy in a comprehensive inpatient rehab setting.  Physiatrist is providing close team supervision and 24 hour management of active medical problems listed below.  Physiatrist and rehab team continue to assess barriers to discharge/monitor patient progress toward  functional and medical goals  Care Tool:  Bathing  Bathing activity did not occur: Refused           Bathing assist       Upper Body Dressing/Undressing Upper body dressing   What is the patient wearing?: Pull over shirt    Upper body assist Assist Level: Independent    Lower Body Dressing/Undressing Lower body dressing      What is the patient wearing?: Pants     Lower body assist Assist for lower body dressing: Set up assist     Toileting Toileting    Toileting assist Assist for toileting: Independent with assistive device     Transfers Chair/bed transfer  Transfers assist  Chair/bed transfer activity did not occur: N/A  Chair/bed transfer assist level: Supervision/Verbal cueing     Locomotion Ambulation   Ambulation assist   Ambulation activity did not occur: N/A  Assist level: Independent Assistive device: Walker-rolling Max distance: >548ft   Walk 10 feet activity   Assist     Assist level: Independent Assistive device: Walker-rolling   Walk 50 feet activity   Assist    Assist level: Independent Assistive device: Walker-rolling    Walk 150 feet activity   Assist Walk 150 feet activity did not occur: Safety/medical concerns  Assist level: Independent      Walk 10 feet on uneven surface  activity   Assist Walk 10 feet on uneven surfaces activity did not occur: Safety/medical concerns   Assist level: Independent  Wheelchair     Assist Will patient use wheelchair at discharge?: No        Max wheelchair distance: 142ft    Wheelchair 50 feet with 2 turns activity    Assist            Wheelchair 150 feet activity     Assist          Medical Problem List and Plan:  1. 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 Bilateral PE/Anticoagulation: Subcutaneous Lovenox transitioned to Eliquis 10/21/2017 status post IVC filter 10/02/2017  3. Pain Management: Neurontin 300 mg every 12 hours, oxycodone as needed  4. Mood: BuSpar 10 mg twice daily, Celexa 20 mg daily, Klonopin 1 mg every 8 hours, Inderal 40 mg 3 times daily, Seroquel 200 mg nightly as well as PRN, trazodone 100 mg nightly, valproic acid 500 mg twice daily   Appreciate neuropsych   Optimize sleep patterns   Team to provide positive reinforcement   Affect improved 5. Neuropsych: This patient is not fully capable of making decisions on his own behalf.  6. Skin/Wound Care: Routine skin checks  7. Fluids/Electrolytes/Nutrition: Strict in and outs   TF through J port, will attempt to transition to bolus feeds  G port to suction   May have ice chips  8. VDRF: Status post tracheostomy decannulated 10/20/2017   Occlusive dressing in place, wound closing  10. Hypothyroidism. Synthroid  11. Tobacco and polysubstance abuse. Continue NicoDerm patch. Provide counseling  12.  Hyponatremia  Sodium 131 on 10/9  Continue to monitor 13.  Labile blood glucose  Relatively controlled on 10/9 14.  Hypoalbuminemia  Supplement initiated on 10/7 15.  Leukocytosis  WBC is 14.6 on 10/8, improving  Afebrile, no signs of infection 16.  Acute blood loss anemia  Hemoglobin 11.8 on 10/8  Cont to monitor  LOS: 5 days A FACE TO FACE EVALUATION WAS PERFORMED  Ankit Karis Juba 10/26/2017, 8:45 AM

## 2017-10-26 NOTE — Progress Notes (Signed)
Physical Therapy Session Note  Patient Details  Name: Philip Thompson MRN: [id number] Date of Birth: 29-Sep-1990  Today's Date: 10/26/2017 PT Individual Time: 1300-1328 PT Individual Time Calculation (min): 28 min   Short Term Goals: Week 1:  PT Short Term Goal 1 (Week 1): =LTG due to ELOS  Skilled Therapeutic Interventions/Progress Updates:    Patient received up on couch, pleasant and willing to participate in PT today, requesting focus on gait/endurance. Able to ambulate >1055f on level indoor and uneven outdoor surfaces, including mild inclines, with intermittent bouts of power walking for increased challenge and HR elevation. Able to ascend/descend 8 flights of stairs (collectively, intermittent through session and no more than 2 flights at a time) with no railing. Requires only S for ambulation and stair training with no device. Also incorporated challenging balance activities such as vertical and horizontal head turns during gait and tandem gait in level hallway with min guard. Patient fatigued at end of session, requesting pain and anxiety medicine, RN aware. He was left sitting on couch in his room with all needs met and questions/concerns addressed this afternoon.   Therapy Documentation Precautions:  Precautions Precautions: Fall Precaution Comments: g tube, j tube, clear liquid diet Other Brace/Splint: PEG tube  ostomy Restrictions Weight Bearing Restrictions: No General:   Pain: Pain Assessment Pain Scale: 0-10 Pain Score: 7  Pain Type: Acute pain Pain Location: Back Pain Orientation: Lower Pain Descriptors / Indicators: Aching;Sore Pain Onset: On-going Patients Stated Pain Goal: 0 Pain Intervention(s): RN made aware;Ambulation/increased activity;Distraction;Environmental changes Mobility:   Locomotion :       Therapy/Group: Individual Therapy  Philip ReePT, DPT, CBIS  Supplemental Physical Therapist [facility]   Pager [phone] Rehab  Office [phone]  10/26/2017, 3:49 PM

## 2017-10-26 NOTE — Progress Notes (Signed)
Physical Therapy Session Note  Patient Details  Name: Philip Thompson MRN: [id number] Date of Birth: June 29, 1990  Today's Date: 10/26/2017 PT Individual Time: 1405-1430 PT Individual Time Calculation (min): 25 min   Short Term Goals: Week 1:  PT Short Term Goal 1 (Week 1): =LTG due to ELOS  Skilled Therapeutic Interventions/Progress Updates: Pt presented in room agreeable to therapy. Pt c/o LBP but able to tolerate therapy. Ambulated to [facility] gym mod I, participated in Dynavision mode B while standing on Airex 1 min and 2 min bouts. Pt able to maintain balance with moderate challenges without LOB.During rest breaks performed LB stretches in chair. Pt returned to room at end of session and ambulated back to room in same manner prior. Pt left in room mod I with all current needs met.      Therapy Documentation Precautions:  Precautions Precautions: Fall Precaution Comments: g tube, j tube, clear liquid diet Other Brace/Splint: PEG tube  ostomy Restrictions Weight Bearing Restrictions: No General:   Vital Signs: Therapy Vitals Temp: 98.8 F (37.1 C) Temp Source: Oral Pulse Rate: (!) 101 Resp: 17 BP: 107/63 Patient Position (if appropriate): Sitting Oxygen Therapy SpO2: 95 % O2 Device: Room Air    Therapy/Group: Individual Therapy  Wilmer Santillo  Baby Gieger, PTA  10/26/2017, 3:41 PM

## 2017-10-27 LAB — BASIC METABOLIC PANEL
ANION GAP: 8 (ref 5–15)
BUN: 10 mg/dL (ref 6–20)
CALCIUM: 9.4 mg/dL (ref 8.9–10.3)
CO2: 30 mmol/L (ref 22–32)
CREATININE: 0.56 mg/dL — AB (ref 0.61–1.24)
Chloride: 97 mmol/L — ABNORMAL LOW (ref 98–111)
Glucose, Bld: 92 mg/dL (ref 70–99)
Potassium: 4.2 mmol/L (ref 3.5–5.1)
Sodium: 135 mmol/L (ref 135–145)

## 2017-10-27 LAB — GLUCOSE, CAPILLARY
Glucose-Capillary: 109 mg/dL — ABNORMAL HIGH (ref 70–99)
Glucose-Capillary: 114 mg/dL — ABNORMAL HIGH (ref 70–99)
Glucose-Capillary: 80 mg/dL (ref 70–99)

## 2017-10-27 MED ORDER — LEVOTHYROXINE SODIUM 75 MCG PO TABS: 75.0000 ug | ORAL_TABLET | Freq: Every day | ORAL | 1 refills | Status: DC

## 2017-10-27 MED ORDER — FENTANYL 12 MCG/HR TD PT72: 50.0000 ug | MEDICATED_PATCH | TRANSDERMAL | 0 refills | Status: DC

## 2017-10-27 MED ORDER — TRAZODONE HCL 50 MG PO TABS: 50.0000 mg | ORAL_TABLET | Freq: Every day | ORAL | 0 refills | Status: DC

## 2017-10-27 MED ORDER — NICOTINE 14 MG/24HR TD PT24: MEDICATED_PATCH | TRANSDERMAL | 0 refills | Status: DC

## 2017-10-27 MED ORDER — PROPRANOLOL HCL 20 MG/5ML PO SOLN: 40.0000 mg | Freq: Three times a day (TID) | ORAL | 12 refills | Status: DC

## 2017-10-27 MED ORDER — QUETIAPINE FUMARATE 200 MG PO TABS: 200.0000 mg | ORAL_TABLET | Freq: Every day | ORAL | 1 refills | Status: DC

## 2017-10-27 MED ORDER — CITALOPRAM HYDROBROMIDE 10 MG/5ML PO SOLN: 20.0000 mg | Freq: Every day | ORAL | 12 refills | Status: DC

## 2017-10-27 MED ORDER — FENTANYL 50 MCG/HR TD PT72: 50.0000 ug | MEDICATED_PATCH | TRANSDERMAL | 0 refills | Status: DC

## 2017-10-27 MED ORDER — FENTANYL 12 MCG/HR TD PT72
50.0000 ug | MEDICATED_PATCH | TRANSDERMAL | Status: DC
  Administered 2017-10-27: 50 ug via TRANSDERMAL
  Filled 2017-10-27: qty 4

## 2017-10-27 MED ORDER — TRAMADOL HCL 50 MG PO TABS: 50.0000 mg | ORAL_TABLET | Freq: Two times a day (BID) | ORAL | 0 refills | Status: DC | PRN

## 2017-10-27 MED ORDER — VALPROIC ACID 250 MG/5ML PO SOLN: 500.0000 mg | Freq: Two times a day (BID) | ORAL | 1 refills | Status: DC

## 2017-10-27 MED ORDER — CLONAZEPAM 1 MG PO TABS: 1.0000 mg | ORAL_TABLET | Freq: Three times a day (TID) | ORAL | 0 refills | Status: AC

## 2017-10-27 MED ORDER — OSMOLITE 1.5 CAL PO LIQD: 1000.0000 mL | ORAL | 0 refills | Status: DC

## 2017-10-27 MED ORDER — FAMOTIDINE 40 MG/5ML PO SUSR: 20.0000 mg | Freq: Two times a day (BID) | ORAL | 0 refills | Status: DC

## 2017-10-27 MED ORDER — BUSPIRONE HCL 10 MG PO TABS: 10.0000 mg | ORAL_TABLET | Freq: Three times a day (TID) | ORAL | 0 refills | Status: DC

## 2017-10-27 MED ORDER — APIXABAN 5 MG PO TABS: 5.0000 mg | ORAL_TABLET | Freq: Two times a day (BID) | ORAL | 1 refills | Status: DC

## 2017-10-27 MED ORDER — FENTANYL 75 MCG/HR TD PT72: MEDICATED_PATCH | TRANSDERMAL | 0 refills | Status: DC

## 2017-10-27 MED ORDER — GABAPENTIN 250 MG/5ML PO SOLN: 300.0000 mg | Freq: Two times a day (BID) | ORAL | 12 refills | Status: DC

## 2017-10-27 NOTE — Discharge Instructions (Signed)
FOR SAFETY, 24 HOUR SUPERVISION IS RECOMMENDED UNTIL OTHERWISE DIRECTED BY A PHYSICIAN.  ABSOLUTELY NO DRIVING UNTIL OTHERWISE DIRECTED BY A PHYSICIAN. CAR KEYS SHOULD BE HIDDEN IN A SAFE LOCATION IF NEEDED.  DUE TO RISK OF INJURY, PLEASE REMOVE GUNS, KNIVES, OR ANY OTHER POTENTIALLY DANGEROUS OBJECTS AND HAZARDOUS MATERIALS FROM THE HOME. Inpatient Rehab Discharge Instructions  Philip Thompson Discharge date and time: No discharge date for patient encounter.   Activities/Precautions/ Functional Status: Activity: activity as tolerated Diet: Resource supplements 3 times daily as well as Osmolite 90 mL's over 16 hours daily through tube Wound Care: keep wound clean and dry Functional status:  ___ No restrictions     ___ Walk up steps independently ___ 24/7 supervision/assistance   ___ Walk up steps with assistance ___ Intermittent supervision/assistance  ___ Bathe/dress independently ___ Walk with walker     _x__ Bathe/dress with assistance ___ Walk Independently    ___ Shower independently ___ Walk with assistance    ___ Shower with assistance ___ No alcohol     ___ Return to work/school ________    COMMUNITY REFERRALS UPON DISCHARGE:    Home Health:   PT      RN                     Agency: [facility] Phone: [phone]    GENERAL COMMUNITY RESOURCES FOR PATIENT/FAMILY:  Mental Health: resume follow up with local counselors/ psychiatry       Special Instructions:  No smoking drinking of alcohol or illicit drug use  Follow-up 4 weeks interventional radiology Dr. Criss Rosales for removal retrievable of IVC filter  My questions have been answered and I understand these instructions. I will adhere to these goals and the provided educational materials after my discharge from the hospital.  Patient/Caregiver Signature _______________________________ Date __________  Clinician Signature _______________________________________ Date __________  Please bring this  form and your medication list with you to all your follow-up doctor's appointments. Information on my medicine - ELIQUIS (apixaban)  This medication education was reviewed with me or my healthcare representative as part of my discharge preparation.  Why was Eliquis prescribed for you? Eliquis was prescribed to treat blood clots that may have been found in the veins of your legs (deep vein thrombosis) or in your lungs (pulmonary embolism) and to reduce the risk of them occurring again.  What do You need to know about Eliquis ? The dose is ONE 5 mg tablet taken TWICE daily.  Eliquis may be taken with or without food.   Try to take the dose about the same time in the morning and in the evening. If you have difficulty swallowing the tablet whole please discuss with your pharmacist how to take the medication safely.  Take Eliquis exactly as prescribed and DO NOT stop taking Eliquis without talking to the doctor who prescribed the medication.  Stopping may increase your risk of developing a new blood clot.  Refill your prescription before you run out.  After discharge, you should have regular check-up appointments with your healthcare provider that is prescribing your Eliquis.    What do you do if you miss a dose? If a dose of ELIQUIS is not taken at the scheduled time, take it as soon as possible on the same day and twice-daily administration should be resumed. The dose should not be doubled to make up for a missed dose.  Important Safety Information A possible side effect of Eliquis is bleeding. You  should call your healthcare provider right away if you experience any of the following: ? Bleeding from an injury or your nose that does not stop. ? Unusual colored urine (red or dark brown) or unusual colored stools (red or black). ? Unusual bruising for unknown reasons. ? A serious fall or if you hit your head (even if there is no bleeding).  Some medicines may interact with Eliquis and  might increase your risk of bleeding or clotting while on Eliquis. To help avoid this, consult your healthcare provider or pharmacist prior to using any new prescription or non-prescription medications, including herbals, vitamins, non-steroidal anti-inflammatory drugs (NSAIDs) and supplements.  This website has more information on Eliquis (apixaban): http://www.eliquis.com/eliquis/home

## 2017-10-27 NOTE — Progress Notes (Signed)
Social Work  Discharge Note  The overall goal for the admission was met for:   Discharge location: Yes - pt d/c'ing home with father, however, mother has taken FMLA to stay with pt and provide recommended supervision/ assist with peg feedings and wound care.  Length of Stay: Yes - 6 days  Discharge activity level: Yes - supervision to independent  Home/community participation: Yes  Services provided included: MD, RD, PT, OT, SLP, RN, Pharmacy, Neuropsych and SW  Financial Services: Private Insurance: [employer]  Follow-up services arranged: Home Health: RN, PT via [facility], DME: tube feeding supplies/ pump via [facility] and Patient/Family has no preference for HH/DME agencies  Comments (or additional information):  Patient/Family verbalized understanding of follow-up arrangements: Yes  Individual responsible for coordination of the follow-up plan: pt/mom  Confirmed correct DME delivered: Philip Thompson 10/27/2017    Philip Thompson

## 2017-10-27 NOTE — Patient Care Conference (Cosign Needed)
Inpatient RehabilitationTeam Conference and Plan of Care Update Date: 10/26/2017   Time: 2:50 PM    Patient Name: Philip Thompson      Medical Record Number: [id number]  Date of Birth: 11/03/1990 Sex: Male         Room/Bed: 4M11C/4M11C-01 Payor Info: Payor: BLUE CROSS BLUE SHIELD / Plan: BCBS OTHER / Product Type: *No Product type* /    Admitting Diagnosis: Debility  Admit Date/Time:  10/21/2017  3:47 PM Admission Comments: No comment available   Primary Diagnosis:  <principal problem not specified> Principal Problem: <principal problem not specified>  Patient Active Problem List   Diagnosis Date Noted  . Acute blood loss anemia   . Labile blood glucose   . Hypoglycemia   . Hyponatremia   . Debility 10/21/2017  . Anxiety and depression   . Protein calorie malnutrition (HCC)   . Tracheostomy care (HCC)   . Tracheostomy in place (HCC)   . History of traumatic brain injury   . Anoxic brain injury (HCC)   . Chronic pain syndrome   . Sinus tachycardia   . Tachypnea   . Leukocytosis   . Pressure injury of skin 10/08/2017  . Acute respiratory failure with hypoxemia (HCC)   . Protein-calorie malnutrition, severe 09/28/2017  . Preoperative cardiovascular examination   . Acute respiratory failure with hypoxia (HCC) 09/22/2017  . Acute pulmonary embolism without acute cor pulmonale (HCC) 09/22/2017  . Hypothyroidism 09/22/2017  . Colon distention   . 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 Thrombocytopenia (HCC) 09/18/2017  . Hemoptysis 09/18/2017  . Megacolon 09/18/2017  . Fecal impaction (HCC)   . Impaction of colon (HCC)   . Obstipation 09/16/2017  . Constipation   . Leg edema   . Acute megacolon (HCC) 09/15/2017  . Hypokalemia 09/15/2017  . Tobacco use 09/15/2017  . Traumatic brain injury (HCC) 04/19/2017  . Mild cognitive disorder 04/19/2017  . MDD (major depressive disorder), recurrent severe, without psychosis (HCC) 03/25/2017  . Polysubstance abuse (HCC) 10/15/2016  .  Substance or medication-induced bipolar and related disorder with onset during intoxication (HCC) 07/05/2016  . Opioid use disorder, moderate, dependence (HCC) 07/05/2016  . Cannabis use disorder, severe, dependence (HCC) 07/05/2016  . Chronic back pain 07/05/2012    Expected Discharge Date: Expected Discharge Date: 10/27/17  Team Members Present: Physician leading conference: Dr. 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    independent to modified independent  functional mobility, selfcare retraining, balance retraining, transfer training, pt/family education   Mobility   distant supervision to mod I for transfers and ambulation  mod I  d/c planning, endurance   Communication               Safety/Cognition/ Behavioral Observations            Pain   c/o pain to back  <4  assess q shift and prn; medicate prn   Skin   old trach site with gauze; split gauze to JG tube site (L abd) with some drainage; colostomy R abd  free from infection/breakdown  assess q shift and prn; dsg changes     Rehab Goals Patient on target to meet rehab  goals: Yes *See Care Plan and progress notes for long and short-term goals.     Barriers to Discharge  Current Status/Progress Possible Resolutions Date Resolved   Physician    Wound Care;Medical stability;Other (comments)  TFs  See above  Therapies, transition to bolus feeds, follow labs      Nursing                  PT                    OT                  SLP                SW Decreased caregiver support;Other (comments)(notable h/o TBI and psychiatric admission) Mother's time to provide 24/7 support is limited.            Discharge Planning/Teaching Needs:  Pt to d/c home with father, however, mother to provide 24/7 supervision initially.  Taking FMLA.  Teaching with mother completed this morning.   Team Discussion:  Pt has met independent goals.  Family ed for ostomy and J-tube care completed with mother. REady for d/c tomorrow.  Revisions to Treatment Plan:  None    Continued Need for Acute Rehabilitation Level of Care: The patient requires daily medical management by a physician with specialized training in physical medicine and rehabilitation for the following conditions: Daily direction of a multidisciplinary physical rehabilitation program to ensure safe treatment while eliciting the highest outcome that is of practical value to the patient.: Yes Daily medical management of patient stability for increased activity during participation in an intensive rehabilitation regime.: Yes Daily analysis of laboratory values and/or radiology reports with any subsequent need for medication adjustment of medical intervention for : Post surgical problems;Mood/behavior problems;Other   I attest that I was present, lead the team conference, and concur with the assessment and plan of the team.   Ahmani Daoud 10/27/2017, 12:06 PM

## 2017-10-27 NOTE — Progress Notes (Signed)
Patient discharged to home per wheelchair accompanied by NT and mother. Discharge instructions done by Jesusita Oka PA, Per patient's mom that equipments will be delivered to the house and a nurse will be coming to assist her with tube feeding. Questions answered re: med and tube feeding administration. No further questions noted.

## 2017-10-28 ENCOUNTER — Telehealth: Payer: Self-pay

## 2017-10-28 DIAGNOSIS — F419 Anxiety disorder, unspecified: Secondary | ICD-10-CM | POA: Diagnosis not present

## 2017-10-28 DIAGNOSIS — M549 Dorsalgia, unspecified: Secondary | ICD-10-CM | POA: Diagnosis not present

## 2017-10-28 DIAGNOSIS — F909 Attention-deficit hyperactivity disorder, unspecified type: Secondary | ICD-10-CM | POA: Diagnosis not present

## 2017-10-28 DIAGNOSIS — K5909 Other constipation: Secondary | ICD-10-CM | POA: Diagnosis not present

## 2017-10-28 DIAGNOSIS — I2699 Other pulmonary embolism without acute cor pulmonale: Secondary | ICD-10-CM | POA: Diagnosis not present

## 2017-10-28 DIAGNOSIS — Z432 Encounter for attention to ileostomy: Secondary | ICD-10-CM | POA: Diagnosis not present

## 2017-10-28 DIAGNOSIS — E43 Unspecified severe protein-calorie malnutrition: Secondary | ICD-10-CM | POA: Diagnosis not present

## 2017-10-28 DIAGNOSIS — I428 Other cardiomyopathies: Secondary | ICD-10-CM | POA: Diagnosis not present

## 2017-10-28 DIAGNOSIS — Z434 Encounter for attention to other artificial openings of digestive tract: Secondary | ICD-10-CM | POA: Diagnosis not present

## 2017-10-28 DIAGNOSIS — G894 Chronic pain syndrome: Secondary | ICD-10-CM | POA: Diagnosis not present

## 2017-10-28 DIAGNOSIS — F332 Major depressive disorder, recurrent severe without psychotic features: Secondary | ICD-10-CM | POA: Diagnosis not present

## 2017-10-28 DIAGNOSIS — Z48815 Encounter for surgical aftercare following surgery on the digestive system: Secondary | ICD-10-CM | POA: Diagnosis not present

## 2017-10-28 DIAGNOSIS — Z431 Encounter for attention to gastrostomy: Secondary | ICD-10-CM | POA: Diagnosis not present

## 2017-10-28 NOTE — Telephone Encounter (Signed)
Transitional Care call-mother Cathy    1. Are you/is patient experiencing any problems since coming home?No Are there any questions regarding any aspect of care?No 2. Are there any questions regarding medications administration/dosing? YesAre meds being taken as prescribed?Yes Patient should review meds with caller to confirm 3. Have there been any falls?No 4. Has Home Health been to the house and/or have they contacted you? yesIf not, have you tried to contact them? Can we help you contact them? 5. Are bowels and bladder emptying properly?Yes Are there any unexpected incontinence issues?No If applicable, is patient following bowel/bladder programs? 6. Any fevers, problems with breathing, unexpected pain?No 7. Are there any skin problems or new areas of breakdown?No 8. Has the patient/family member arranged specialty MD follow up (ie cardiology/neurology/renal/surgical/etc)? Yes Can we help arrange? 9. Does the patient need any other services or support that we can help arrange?No 10. Are caregivers following through as expected in assisting the patient?Yes 11. Has the patient quit smoking, drinking alcohol, or using drugs as recommended?smoke one cigarette, started back on nicotine patches Appointment time, arrive time 10:40 for 11:00 with Dr. Allena Katz on 11/09/17 [address]

## 2017-10-28 NOTE — Telephone Encounter (Signed)
Particia Jasper RN [facility] called requesting verbal orders for this patient for skilled nursing for feeding and ileostomy training for 1xwk X 1wk followed by 2xwk X 1wk followed by 1xwk X 2wks.  Called her back and confirmed verbal orders.

## 2017-10-31 DIAGNOSIS — E43 Unspecified severe protein-calorie malnutrition: Secondary | ICD-10-CM | POA: Diagnosis not present

## 2017-10-31 DIAGNOSIS — Z431 Encounter for attention to gastrostomy: Secondary | ICD-10-CM | POA: Diagnosis not present

## 2017-10-31 DIAGNOSIS — F419 Anxiety disorder, unspecified: Secondary | ICD-10-CM | POA: Diagnosis not present

## 2017-10-31 DIAGNOSIS — I428 Other cardiomyopathies: Secondary | ICD-10-CM | POA: Diagnosis not present

## 2017-10-31 DIAGNOSIS — Z434 Encounter for attention to other artificial openings of digestive tract: Secondary | ICD-10-CM | POA: Diagnosis not present

## 2017-10-31 DIAGNOSIS — F909 Attention-deficit hyperactivity disorder, unspecified type: Secondary | ICD-10-CM | POA: Diagnosis not present

## 2017-10-31 DIAGNOSIS — G894 Chronic pain syndrome: Secondary | ICD-10-CM | POA: Diagnosis not present

## 2017-10-31 DIAGNOSIS — I2699 Other pulmonary embolism without acute cor pulmonale: Secondary | ICD-10-CM | POA: Diagnosis not present

## 2017-10-31 DIAGNOSIS — Z48815 Encounter for surgical aftercare following surgery on the digestive system: Secondary | ICD-10-CM | POA: Diagnosis not present

## 2017-10-31 DIAGNOSIS — Z432 Encounter for attention to ileostomy: Secondary | ICD-10-CM | POA: Diagnosis not present

## 2017-10-31 DIAGNOSIS — F332 Major depressive disorder, recurrent severe without psychotic features: Secondary | ICD-10-CM | POA: Diagnosis not present

## 2017-10-31 DIAGNOSIS — M549 Dorsalgia, unspecified: Secondary | ICD-10-CM | POA: Diagnosis not present

## 2017-10-31 DIAGNOSIS — K5909 Other constipation: Secondary | ICD-10-CM | POA: Diagnosis not present

## 2017-11-03 DIAGNOSIS — K56609 Unspecified intestinal obstruction, unspecified as to partial versus complete obstruction: Secondary | ICD-10-CM | POA: Diagnosis not present

## 2017-11-03 DIAGNOSIS — Z933 Colostomy status: Secondary | ICD-10-CM | POA: Diagnosis not present

## 2017-11-04 DIAGNOSIS — I2699 Other pulmonary embolism without acute cor pulmonale: Secondary | ICD-10-CM | POA: Diagnosis not present

## 2017-11-04 DIAGNOSIS — Z432 Encounter for attention to ileostomy: Secondary | ICD-10-CM | POA: Diagnosis not present

## 2017-11-04 DIAGNOSIS — Z434 Encounter for attention to other artificial openings of digestive tract: Secondary | ICD-10-CM | POA: Diagnosis not present

## 2017-11-04 DIAGNOSIS — Z48815 Encounter for surgical aftercare following surgery on the digestive system: Secondary | ICD-10-CM | POA: Diagnosis not present

## 2017-11-04 DIAGNOSIS — E43 Unspecified severe protein-calorie malnutrition: Secondary | ICD-10-CM | POA: Diagnosis not present

## 2017-11-04 DIAGNOSIS — I428 Other cardiomyopathies: Secondary | ICD-10-CM | POA: Diagnosis not present

## 2017-11-04 DIAGNOSIS — K5909 Other constipation: Secondary | ICD-10-CM | POA: Diagnosis not present

## 2017-11-04 DIAGNOSIS — F909 Attention-deficit hyperactivity disorder, unspecified type: Secondary | ICD-10-CM | POA: Diagnosis not present

## 2017-11-04 DIAGNOSIS — G894 Chronic pain syndrome: Secondary | ICD-10-CM | POA: Diagnosis not present

## 2017-11-04 DIAGNOSIS — F332 Major depressive disorder, recurrent severe without psychotic features: Secondary | ICD-10-CM | POA: Diagnosis not present

## 2017-11-04 DIAGNOSIS — F419 Anxiety disorder, unspecified: Secondary | ICD-10-CM | POA: Diagnosis not present

## 2017-11-04 DIAGNOSIS — Z431 Encounter for attention to gastrostomy: Secondary | ICD-10-CM | POA: Diagnosis not present

## 2017-11-04 DIAGNOSIS — M549 Dorsalgia, unspecified: Secondary | ICD-10-CM | POA: Diagnosis not present

## 2017-11-06 DIAGNOSIS — F909 Attention-deficit hyperactivity disorder, unspecified type: Secondary | ICD-10-CM | POA: Diagnosis not present

## 2017-11-06 DIAGNOSIS — Z431 Encounter for attention to gastrostomy: Secondary | ICD-10-CM | POA: Diagnosis not present

## 2017-11-06 DIAGNOSIS — F419 Anxiety disorder, unspecified: Secondary | ICD-10-CM | POA: Diagnosis not present

## 2017-11-06 DIAGNOSIS — M549 Dorsalgia, unspecified: Secondary | ICD-10-CM | POA: Diagnosis not present

## 2017-11-06 DIAGNOSIS — Z432 Encounter for attention to ileostomy: Secondary | ICD-10-CM | POA: Diagnosis not present

## 2017-11-06 DIAGNOSIS — E43 Unspecified severe protein-calorie malnutrition: Secondary | ICD-10-CM | POA: Diagnosis not present

## 2017-11-06 DIAGNOSIS — Z434 Encounter for attention to other artificial openings of digestive tract: Secondary | ICD-10-CM | POA: Diagnosis not present

## 2017-11-06 DIAGNOSIS — Z48815 Encounter for surgical aftercare following surgery on the digestive system: Secondary | ICD-10-CM | POA: Diagnosis not present

## 2017-11-06 DIAGNOSIS — K5909 Other constipation: Secondary | ICD-10-CM | POA: Diagnosis not present

## 2017-11-06 DIAGNOSIS — I428 Other cardiomyopathies: Secondary | ICD-10-CM | POA: Diagnosis not present

## 2017-11-06 DIAGNOSIS — I2699 Other pulmonary embolism without acute cor pulmonale: Secondary | ICD-10-CM | POA: Diagnosis not present

## 2017-11-06 DIAGNOSIS — F332 Major depressive disorder, recurrent severe without psychotic features: Secondary | ICD-10-CM | POA: Diagnosis not present

## 2017-11-06 DIAGNOSIS — G894 Chronic pain syndrome: Secondary | ICD-10-CM | POA: Diagnosis not present

## 2017-11-07 ENCOUNTER — Telehealth: Payer: Self-pay | Admitting: *Deleted

## 2017-11-07 ENCOUNTER — Telehealth: Payer: Self-pay | Admitting: Family Medicine

## 2017-11-07 ENCOUNTER — Telehealth: Payer: Self-pay

## 2017-11-07 DIAGNOSIS — Z432 Encounter for attention to ileostomy: Secondary | ICD-10-CM | POA: Diagnosis not present

## 2017-11-07 DIAGNOSIS — G894 Chronic pain syndrome: Secondary | ICD-10-CM | POA: Diagnosis not present

## 2017-11-07 DIAGNOSIS — Z48815 Encounter for surgical aftercare following surgery on the digestive system: Secondary | ICD-10-CM | POA: Diagnosis not present

## 2017-11-07 DIAGNOSIS — I428 Other cardiomyopathies: Secondary | ICD-10-CM | POA: Diagnosis not present

## 2017-11-07 DIAGNOSIS — K5909 Other constipation: Secondary | ICD-10-CM | POA: Diagnosis not present

## 2017-11-07 DIAGNOSIS — F332 Major depressive disorder, recurrent severe without psychotic features: Secondary | ICD-10-CM | POA: Diagnosis not present

## 2017-11-07 DIAGNOSIS — Z431 Encounter for attention to gastrostomy: Secondary | ICD-10-CM | POA: Diagnosis not present

## 2017-11-07 DIAGNOSIS — F909 Attention-deficit hyperactivity disorder, unspecified type: Secondary | ICD-10-CM | POA: Diagnosis not present

## 2017-11-07 DIAGNOSIS — Z434 Encounter for attention to other artificial openings of digestive tract: Secondary | ICD-10-CM | POA: Diagnosis not present

## 2017-11-07 DIAGNOSIS — F419 Anxiety disorder, unspecified: Secondary | ICD-10-CM | POA: Diagnosis not present

## 2017-11-07 DIAGNOSIS — E43 Unspecified severe protein-calorie malnutrition: Secondary | ICD-10-CM | POA: Diagnosis not present

## 2017-11-07 DIAGNOSIS — I2699 Other pulmonary embolism without acute cor pulmonale: Secondary | ICD-10-CM | POA: Diagnosis not present

## 2017-11-07 DIAGNOSIS — M549 Dorsalgia, unspecified: Secondary | ICD-10-CM | POA: Diagnosis not present

## 2017-11-07 NOTE — Telephone Encounter (Signed)
Kathlene November with [facility] calling for verbal order for a PT. He did an evaluation on the pt yesterday and the pt is mobile and doesn't need further PT. CB# [phone]

## 2017-11-07 NOTE — Telephone Encounter (Signed)
Patient mother called and stated patient having URI issues. Consult with Dr Lorin Picket who recommended patient go to Urgent Care or ER for evaluation given underlying medical condition. Mother verbalized understanding.

## 2017-11-07 NOTE — Telephone Encounter (Signed)
Patients mother called and asked if there is anything that he can take over the counter for routine cough/cold symptoms.  I asked if there was any fever or heat or redness in the areas where all his tubes and bag were at and she stated none of those.  Recommended home remedies such as salt-walter gargle for sore throat, increase fluids and rest until his follow up in 2 days.  Also stated if symptoms do worsen and he develops a fever or any of the earlier symptoms to immediately go to the ER.

## 2017-11-07 NOTE — Telephone Encounter (Signed)
I assume he needs a verbal order to discontinue physical therapy based upon what I am reading If that is the case may have verbal order for that

## 2017-11-07 NOTE — Telephone Encounter (Signed)
Verbal order given to [facility] at [facility]

## 2017-11-09 ENCOUNTER — Encounter
Payer: BLUE CROSS/BLUE SHIELD | Attending: Physical Medicine & Rehabilitation | Admitting: Physical Medicine & Rehabilitation

## 2017-11-09 ENCOUNTER — Encounter: Payer: Self-pay | Admitting: Physical Medicine & Rehabilitation

## 2017-11-09 VITALS — BP 98/63 | HR 94 | Ht 68.0 in | Wt 159.0 lb

## 2017-11-09 DIAGNOSIS — G8921 Chronic pain due to trauma: Secondary | ICD-10-CM

## 2017-11-09 DIAGNOSIS — Z801 Family history of malignant neoplasm of trachea, bronchus and lung: Secondary | ICD-10-CM | POA: Insufficient documentation

## 2017-11-09 DIAGNOSIS — Z811 Family history of alcohol abuse and dependence: Secondary | ICD-10-CM | POA: Insufficient documentation

## 2017-11-09 DIAGNOSIS — S069X2A Unspecified intracranial injury with loss of consciousness of 31 minutes to 59 minutes, initial encounter: Secondary | ICD-10-CM | POA: Insufficient documentation

## 2017-11-09 DIAGNOSIS — K598 Other specified functional intestinal disorders: Secondary | ICD-10-CM

## 2017-11-09 DIAGNOSIS — Z8249 Family history of ischemic heart disease and other diseases of the circulatory system: Secondary | ICD-10-CM | POA: Insufficient documentation

## 2017-11-09 DIAGNOSIS — F333 Major depressive disorder, recurrent, severe with psychotic symptoms: Secondary | ICD-10-CM | POA: Insufficient documentation

## 2017-11-09 DIAGNOSIS — Z833 Family history of diabetes mellitus: Secondary | ICD-10-CM | POA: Diagnosis not present

## 2017-11-09 DIAGNOSIS — Z8041 Family history of malignant neoplasm of ovary: Secondary | ICD-10-CM | POA: Diagnosis not present

## 2017-11-09 DIAGNOSIS — F988 Other specified behavioral and emotional disorders with onset usually occurring in childhood and adolescence: Secondary | ICD-10-CM | POA: Diagnosis not present

## 2017-11-09 DIAGNOSIS — F0630 Mood disorder due to known physiological condition, unspecified: Secondary | ICD-10-CM

## 2017-11-09 DIAGNOSIS — Z720 Tobacco use: Secondary | ICD-10-CM

## 2017-11-09 DIAGNOSIS — K5981 Ogilvie syndrome: Secondary | ICD-10-CM

## 2017-11-09 NOTE — Progress Notes (Addendum)
Subjective:    Patient ID: Philip Thompson, male    DOB: 08/30/1990, 27 y.o.   MRN: [id number]  HPI 27 year old right-handed male with history of multiple TBIs, anoxic brain injury, chronic pain, polysubstance abuse, chronic constipation, presents for transitional care management after receiving CIR for debility.  DATE OF ADMISSION: 10/21/2017 DATE OF DISCHARGE: 10/27/2017   Mother presents, who supplements history. Since discharge, exchanges communication regarding surgeries.  He notes intermittent pain in abdomen and back.  He sees Surgery next week. He was told by his PCP to follow up with other physicians due to complexity.  His cold is improving.  At discharge, he was instructed to maintain liquid diet, denies choking or related coughing.  He has TFs over 16 hours.  Mother called [facility], who informed her IVC filter was permanent.  Denies falls.  Therapies: Released from therapies Mobility: No assistive device needed DME: Not required  Pain Inventory Average Pain 7 Pain Right Now 9 My pain is burning and stabbing  In the last 24 hours, has pain interfered with the following? General activity 8 Relation with others 8 Enjoyment of life 10 What TIME of day is your pain at its worst? morning, evening, night Sleep (in general) Fair  Pain is worse with: bending Pain improves with: medication Relief from Meds: 3  Mobility walk without assistance how many minutes can you walk? 10-15 ability to climb steps?  yes do you drive?  no transfers alone  Function not employed: date last employed . I need assistance with the following:  bathing, household duties and shopping Do you have any goals in this area?  yes  Neuro/Psych weakness numbness confusion depression anxiety  Prior Studies transitional care  Physicians involved in your care transitional care   Family History  Problem Relation Age of Onset  . Dementia Father   . Alcohol abuse Father   . Diabetes Brother    . Cancer - Ovarian Maternal Grandmother   . Cancer - Lung Paternal Grandmother   . Cancer Paternal Grandfather   . Diabetes Other   . Hypertension Other   . Heart disease Other   . Heart attack Other    Social History   Socioeconomic History  . Marital status: Single    Spouse name: Not on file  . Number of children: Not on file  . Years of education: Not on file  . Highest education level: 12th grade  Occupational History  . Not on file  Social Needs  . Financial resource strain: Somewhat hard  . Food insecurity:    Worry: Never true    Inability: Never true  . Transportation needs:    Medical: No    Non-medical: No  Tobacco Use  . Smoking status: Current Every Day Smoker    Packs/day: 1.00    Years: 7.00    Pack years: 7.00    Types: Cigarettes  . Smokeless tobacco: Never Used  Substance and Sexual Activity  . Alcohol use: Not Currently    Comment: hx of. last use 01/2016  . Drug use: Yes    Types: Marijuana, Cocaine    Comment: last used marijuana 3 weeks ago. heroin last used  6 months ago.   Marland Kitchen Sexual activity: Yes    Birth control/protection: Condom  Lifestyle  . Physical activity:    Days per week: 0 days    Minutes per session: 0 min  . Stress: To some extent  Relationships  . Social connections:  Talks on phone: More than three times a week    Gets together: Twice a week    Attends religious service: Never    Active member of club or organization: No    Attends meetings of clubs or organizations: Never    Relationship status: Never married  Other Topics Concern  . Not on file  Social History Narrative      Resides with parents    Past Surgical History:  Procedure Laterality Date  . COLECTOMY N/A 09/27/2017   Procedure: SUBTOTAL COLECTOMY AND ILEOSTOMY;  Surgeon: Abigail Miyamoto, MD;  Location: [facility] OR;  Service: General;  Laterality: N/A;  . FOOT SURGERY     to get a BB out  . IR GASTR TUBE CONVERT GASTR-JEJ PER W/FL MOD SED  10/19/2017  .  IR GASTROSTOMY TUBE MOD SED  10/17/2017  . IR IVC FILTER PLMT / S&I /IMG GUID/MOD SED  10/02/2017  . NO PAST SURGERIES     Past Medical History:  Diagnosis Date  . ADD (attention deficit disorder)   . Aggression   . Allergic rhinitis   . Anxiety   . Arthritis    joint pain  . Cardiomyopathy (HCC)    a. EF <25% in 2015 during admission for multi substance intoxication/respiratory failure requiring intubation.  . Chronic back pain   . Constipation   . Depression   . Fecal obstruction (HCC)   . Frontal head injury   . Heroin overdose (HCC)   . Obstipation   . Polysubstance abuse (HCC)   . Suicide attempt (HCC)    BP 98/63 (BP Location: Left Arm, Patient Position: Sitting, Cuff Size: Normal)   Pulse 94   Ht 5\' 8"  (1.727 m)   Wt 159 lb (72.1 kg)   SpO2 (!) 89%   BMI 24.18 kg/m   Opioid Risk Score:   Fall Risk Score:  `1  Depression screen PHQ 2/9  Depression screen [facility] 2/9 11/09/2017 01/21/2017 08/20/2013  Decreased Interest 1 1 0  Down, Depressed, Hopeless 0 0 0  PHQ - 2 Score 1 1 0  Altered sleeping 0 - -  Tired, decreased energy 2 - -  Change in appetite 0 - -  Feeling bad or failure about yourself  0 - -  Trouble concentrating 0 - -  Moving slowly or fidgety/restless 0 - -  Suicidal thoughts 0 - -  PHQ-9 Score 3 - -  Difficult doing work/chores Not difficult at all - -    Review of Systems  Constitutional: Negative.   HENT: Negative.   Eyes: Negative.   Respiratory: Positive for cough and wheezing.   Gastrointestinal: Positive for abdominal pain.  Endocrine: Negative.   Genitourinary: Positive for difficulty urinating, dysuria and flank pain.  Musculoskeletal: Positive for back pain and neck pain.  Skin: Negative.   Allergic/Immunologic: Negative.   Neurological: Positive for weakness and numbness.  Psychiatric/Behavioral: Positive for confusion and dysphoric mood. The patient is nervous/anxious.   All other systems reviewed and are negative.       Objective:   Physical Exam Constitutional: NAD.  Frail. HENT: Normocephalic and atraumatic.  Eyes: EOMI.  No discharge. Neck: Stoma closed Cardiovascular: RRR. No JVD. Respiratory: Effort normal. Clear. GI: Bowel sounds are normal. He exhibits no distension. Ileostomy which sealed.  +G/J tube  Musculoskeletal: He exhibits no edema and no tenderness.  Neurological: He is alert and oriented Follows simple commands with reasonable insight.  Motor: 4+/5 grossly throughout Skin: Skin is warm. See above.  Incision/tube C/D/I Psychiatric: Mildly flat    Assessment & Plan:  27 year old right-handed male with history of multiple TBIs, anoxic brain injury, chronic pain, polysubstance abuse, chronic constipation, presents for transitional care management after receiving CIR for debility  1. 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 Bilateral PE/Anticoagulation:   Was told by [facility], IVC permanent  3. Chronic pain syndrome  Wean medications, used last fentanyl patch  4. Mood:   Cont follow up with Psychiatry   5. Tobacco and polysubstance abuse.   1/4 PPD  Encouraged abstinence   Meds reviewed Referrals reviewed All questions answered

## 2017-11-12 DIAGNOSIS — F332 Major depressive disorder, recurrent severe without psychotic features: Secondary | ICD-10-CM | POA: Diagnosis not present

## 2017-11-12 DIAGNOSIS — K5909 Other constipation: Secondary | ICD-10-CM | POA: Diagnosis not present

## 2017-11-12 DIAGNOSIS — I428 Other cardiomyopathies: Secondary | ICD-10-CM | POA: Diagnosis not present

## 2017-11-12 DIAGNOSIS — I2699 Other pulmonary embolism without acute cor pulmonale: Secondary | ICD-10-CM | POA: Diagnosis not present

## 2017-11-12 DIAGNOSIS — Z432 Encounter for attention to ileostomy: Secondary | ICD-10-CM | POA: Diagnosis not present

## 2017-11-12 DIAGNOSIS — F909 Attention-deficit hyperactivity disorder, unspecified type: Secondary | ICD-10-CM | POA: Diagnosis not present

## 2017-11-12 DIAGNOSIS — E43 Unspecified severe protein-calorie malnutrition: Secondary | ICD-10-CM | POA: Diagnosis not present

## 2017-11-12 DIAGNOSIS — Z434 Encounter for attention to other artificial openings of digestive tract: Secondary | ICD-10-CM | POA: Diagnosis not present

## 2017-11-12 DIAGNOSIS — Z431 Encounter for attention to gastrostomy: Secondary | ICD-10-CM | POA: Diagnosis not present

## 2017-11-12 DIAGNOSIS — F419 Anxiety disorder, unspecified: Secondary | ICD-10-CM | POA: Diagnosis not present

## 2017-11-12 DIAGNOSIS — M549 Dorsalgia, unspecified: Secondary | ICD-10-CM | POA: Diagnosis not present

## 2017-11-12 DIAGNOSIS — G894 Chronic pain syndrome: Secondary | ICD-10-CM | POA: Diagnosis not present

## 2017-11-12 DIAGNOSIS — Z48815 Encounter for surgical aftercare following surgery on the digestive system: Secondary | ICD-10-CM | POA: Diagnosis not present

## 2017-11-14 DIAGNOSIS — Z933 Colostomy status: Secondary | ICD-10-CM | POA: Diagnosis not present

## 2017-11-14 DIAGNOSIS — K56609 Unspecified intestinal obstruction, unspecified as to partial versus complete obstruction: Secondary | ICD-10-CM | POA: Diagnosis not present

## 2017-11-15 DIAGNOSIS — K56609 Unspecified intestinal obstruction, unspecified as to partial versus complete obstruction: Secondary | ICD-10-CM | POA: Diagnosis not present

## 2017-11-15 DIAGNOSIS — Z933 Colostomy status: Secondary | ICD-10-CM | POA: Diagnosis not present

## 2017-11-16 ENCOUNTER — Ambulatory Visit (HOSPITAL_COMMUNITY): Payer: Self-pay | Admitting: Licensed Clinical Social Worker

## 2017-11-16 DIAGNOSIS — F3181 Bipolar II disorder: Secondary | ICD-10-CM | POA: Diagnosis not present

## 2017-11-16 DIAGNOSIS — K5909 Other constipation: Secondary | ICD-10-CM | POA: Diagnosis not present

## 2017-11-16 DIAGNOSIS — Z48815 Encounter for surgical aftercare following surgery on the digestive system: Secondary | ICD-10-CM | POA: Diagnosis not present

## 2017-11-16 DIAGNOSIS — Z434 Encounter for attention to other artificial openings of digestive tract: Secondary | ICD-10-CM | POA: Diagnosis not present

## 2017-11-16 DIAGNOSIS — Z432 Encounter for attention to ileostomy: Secondary | ICD-10-CM | POA: Diagnosis not present

## 2017-11-16 DIAGNOSIS — F909 Attention-deficit hyperactivity disorder, unspecified type: Secondary | ICD-10-CM | POA: Diagnosis not present

## 2017-11-16 NOTE — Telephone Encounter (Signed)
Patient is coming back in to get reestablished has not been here in 5 years

## 2017-11-23 ENCOUNTER — Encounter: Payer: Self-pay | Admitting: Family Medicine

## 2017-11-23 ENCOUNTER — Ambulatory Visit: Payer: BLUE CROSS/BLUE SHIELD | Admitting: Family Medicine

## 2017-11-23 VITALS — BP 96/68 | Ht 68.0 in | Wt 158.0 lb

## 2017-11-23 DIAGNOSIS — E039 Hypothyroidism, unspecified: Secondary | ICD-10-CM

## 2017-11-23 DIAGNOSIS — D509 Iron deficiency anemia, unspecified: Secondary | ICD-10-CM | POA: Diagnosis not present

## 2017-11-23 DIAGNOSIS — N289 Disorder of kidney and ureter, unspecified: Secondary | ICD-10-CM

## 2017-11-23 MED ORDER — MUPIROCIN 2 % EX OINT: TOPICAL_OINTMENT | CUTANEOUS | 0 refills | Status: DC

## 2017-11-23 MED ORDER — CEFPROZIL 250 MG/5ML PO SUSR: ORAL | 0 refills | Status: DC

## 2017-11-23 NOTE — Progress Notes (Signed)
   Subjective:    Patient ID: Philip Thompson, male    DOB: [birth year], 27 y.o.   MRN: [id number]  HPI Patient is here today to re establish care. He has seen Eber Jones this past year per mother. He eats healthy and gets some exercise. He does see a Gi Dr and has an upcoming appt on 11/30/2017.He will see a surgeron on Dec 20, 2017 Dr. Magnus Ivan. Patient went through some severe surgeries he had his whole colon removed because of megacolon He has had various complications including blood clots he is currently on Eliquis he will need to be on this for a good 5 months if not longer he is followed by other specialists as well  Review of Systems  Constitutional: Negative for diaphoresis and fatigue.  HENT: Negative for congestion and rhinorrhea.   Respiratory: Negative for cough and shortness of breath.   Cardiovascular: Negative for chest pain and leg swelling.  Gastrointestinal: Negative for abdominal pain and diarrhea.  Skin: Negative for color change and rash.  Neurological: Negative for dizziness and headaches.  Psychiatric/Behavioral: Negative for behavioral problems and confusion.   Patient has hypothyroidism we will will recheck TSH    Objective:   Physical Exam  Constitutional: He appears well-nourished. No distress.  HENT:  Head: Normocephalic and atraumatic.  Eyes: Right eye exhibits no discharge. Left eye exhibits no discharge.  Neck: No tracheal deviation present.  Cardiovascular: Normal rate, regular rhythm and normal heart sounds.  No murmur heard. Pulmonary/Chest: Effort normal and breath sounds normal. No respiratory distress.  Musculoskeletal: He exhibits no edema.  Lymphadenopathy:    He has no cervical adenopathy.  Neurological: He is alert. Coordination normal.  Skin: Skin is warm and dry.  Psychiatric: He has a normal mood and affect. His behavior is normal.  Vitals reviewed. Patient has a G-tube currently.  Patient with nutritional deficiencies will check lab  work patient doing much better job eating  This patient took 30 minutes to be able to reestablish him in regards to the new patient going over his records plus also physical exam plus also discussing a game plan and coordination    Assessment & Plan:  Patient currently disabled  Patient to follow-up with his psychiatrist on a regular basis  Patient is seeing gastroenterology to have a removed hopefully  G-tube redness use Bactroban twice daily if ongoing troubles follow-up  Follow-up with surgeon in early December to see if they are going to refer him to specialist to test for Hirschsprung's and hopefully be able to reconnect his intestines more than likely that surgery will occur in 5 to 6 months  Patient to follow-up here 6 weeks sooner problems

## 2017-11-27 DIAGNOSIS — S069X9A Unspecified intracranial injury with loss of consciousness of unspecified duration, initial encounter: Secondary | ICD-10-CM | POA: Diagnosis not present

## 2017-11-29 ENCOUNTER — Ambulatory Visit (HOSPITAL_COMMUNITY): Payer: Self-pay | Admitting: Licensed Clinical Social Worker

## 2017-11-30 ENCOUNTER — Other Ambulatory Visit (HOSPITAL_COMMUNITY): Payer: Self-pay | Admitting: Gastroenterology

## 2017-11-30 DIAGNOSIS — Z432 Encounter for attention to ileostomy: Secondary | ICD-10-CM | POA: Diagnosis not present

## 2017-11-30 DIAGNOSIS — Z933 Colostomy status: Secondary | ICD-10-CM | POA: Diagnosis not present

## 2017-11-30 DIAGNOSIS — Z434 Encounter for attention to other artificial openings of digestive tract: Secondary | ICD-10-CM | POA: Diagnosis not present

## 2017-11-30 DIAGNOSIS — R633 Feeding difficulties, unspecified: Secondary | ICD-10-CM

## 2017-11-30 DIAGNOSIS — K56609 Unspecified intestinal obstruction, unspecified as to partial versus complete obstruction: Secondary | ICD-10-CM | POA: Diagnosis not present

## 2017-12-01 ENCOUNTER — Telehealth: Payer: Self-pay | Admitting: Family Medicine

## 2017-12-01 NOTE — Telephone Encounter (Signed)
Carollee Herter with [facility] faxed over a request to stop pts Eliquis for J tube removal on 12/22/17. Form placed in Dr. Roby Lofts box in office. Please fax back to [phone] attention Shannon.

## 2017-12-05 ENCOUNTER — Encounter: Payer: Self-pay | Admitting: Psychology

## 2017-12-05 ENCOUNTER — Encounter: Payer: BLUE CROSS/BLUE SHIELD | Attending: Psychology | Admitting: Psychology

## 2017-12-05 DIAGNOSIS — S069X2A Unspecified intracranial injury with loss of consciousness of 31 minutes to 59 minutes, initial encounter: Secondary | ICD-10-CM | POA: Insufficient documentation

## 2017-12-05 DIAGNOSIS — K598 Other specified functional intestinal disorders: Secondary | ICD-10-CM

## 2017-12-05 DIAGNOSIS — Z8249 Family history of ischemic heart disease and other diseases of the circulatory system: Secondary | ICD-10-CM | POA: Diagnosis not present

## 2017-12-05 DIAGNOSIS — F333 Major depressive disorder, recurrent, severe with psychotic symptoms: Secondary | ICD-10-CM | POA: Insufficient documentation

## 2017-12-05 DIAGNOSIS — Z811 Family history of alcohol abuse and dependence: Secondary | ICD-10-CM | POA: Diagnosis not present

## 2017-12-05 DIAGNOSIS — K5981 Ogilvie syndrome: Secondary | ICD-10-CM

## 2017-12-05 DIAGNOSIS — Z8041 Family history of malignant neoplasm of ovary: Secondary | ICD-10-CM | POA: Diagnosis not present

## 2017-12-05 DIAGNOSIS — F988 Other specified behavioral and emotional disorders with onset usually occurring in childhood and adolescence: Secondary | ICD-10-CM | POA: Insufficient documentation

## 2017-12-05 DIAGNOSIS — Z801 Family history of malignant neoplasm of trachea, bronchus and lung: Secondary | ICD-10-CM | POA: Diagnosis not present

## 2017-12-05 DIAGNOSIS — G8921 Chronic pain due to trauma: Secondary | ICD-10-CM

## 2017-12-05 DIAGNOSIS — Z833 Family history of diabetes mellitus: Secondary | ICD-10-CM | POA: Diagnosis not present

## 2017-12-05 NOTE — Progress Notes (Signed)
Today the patient was administered the Wechsler Adult Intelligence Scale-IV as well as the Wechsler Memory Scale-IV.  The patient appeared to fully participate on this measure and appeared to give him his full effort.  This does appear to be a fair and valid administration.  The patient was alert and oriented during the testing procedures.  These measures were administered and scored today during the 4 hours of administration and they were administered by myself.  The patient's test performances will be interpreted and formal report light writing will be conducted on the next visit and then the feedback session will be conducted.

## 2017-12-05 NOTE — Telephone Encounter (Signed)
This form was filled out Please send it to the specialist May stop Eliquis 2 days before procedure May resume after 24 hours if no bleeding If deemed to be high risk of bleeding per specialist then I recommend restarting 48 hours later Patient was put on blood thinner back in early September with blood clots he will be on it for at least 6 months

## 2017-12-06 NOTE — Telephone Encounter (Signed)
I faxed the form to [facility].

## 2017-12-06 NOTE — Telephone Encounter (Signed)
Fax was not going through to the [phone] number. Contacted shannon to ask if she had received the fax. Left message with receptionist for Carollee Herter to call me back. Carollee Herter returned call and states that the [phone] number was not the right fax number. Faxed paper again to [phone] and the fax went through.

## 2017-12-12 ENCOUNTER — Encounter (HOSPITAL_BASED_OUTPATIENT_CLINIC_OR_DEPARTMENT_OTHER): Payer: BLUE CROSS/BLUE SHIELD | Admitting: Psychology

## 2017-12-12 DIAGNOSIS — S069X2A Unspecified intracranial injury with loss of consciousness of 31 minutes to 59 minutes, initial encounter: Secondary | ICD-10-CM

## 2017-12-12 DIAGNOSIS — Z801 Family history of malignant neoplasm of trachea, bronchus and lung: Secondary | ICD-10-CM | POA: Diagnosis not present

## 2017-12-12 DIAGNOSIS — Z8041 Family history of malignant neoplasm of ovary: Secondary | ICD-10-CM | POA: Diagnosis not present

## 2017-12-12 DIAGNOSIS — G8921 Chronic pain due to trauma: Secondary | ICD-10-CM | POA: Diagnosis not present

## 2017-12-12 DIAGNOSIS — Z8249 Family history of ischemic heart disease and other diseases of the circulatory system: Secondary | ICD-10-CM | POA: Diagnosis not present

## 2017-12-12 DIAGNOSIS — Z833 Family history of diabetes mellitus: Secondary | ICD-10-CM | POA: Diagnosis not present

## 2017-12-12 DIAGNOSIS — F333 Major depressive disorder, recurrent, severe with psychotic symptoms: Secondary | ICD-10-CM

## 2017-12-12 DIAGNOSIS — F988 Other specified behavioral and emotional disorders with onset usually occurring in childhood and adolescence: Secondary | ICD-10-CM | POA: Diagnosis not present

## 2017-12-12 DIAGNOSIS — Z811 Family history of alcohol abuse and dependence: Secondary | ICD-10-CM | POA: Diagnosis not present

## 2017-12-12 DIAGNOSIS — F0630 Mood disorder due to known physiological condition, unspecified: Secondary | ICD-10-CM

## 2017-12-20 ENCOUNTER — Ambulatory Visit (HOSPITAL_COMMUNITY): Payer: Self-pay | Admitting: Licensed Clinical Social Worker

## 2017-12-21 ENCOUNTER — Other Ambulatory Visit: Payer: Self-pay | Admitting: Student

## 2017-12-22 ENCOUNTER — Encounter: Payer: Self-pay | Admitting: Physician Assistant

## 2017-12-22 ENCOUNTER — Ambulatory Visit (HOSPITAL_COMMUNITY)
Admission: RE | Admit: 2017-12-22 | Discharge: 2017-12-22 | Disposition: A | Discharge status: Home / Self Care | Payer: BLUE CROSS/BLUE SHIELD | Source: Ambulatory Visit | Attending: Gastroenterology | Admitting: Gastroenterology

## 2017-12-22 DIAGNOSIS — R633 Feeding difficulties, unspecified: Secondary | ICD-10-CM

## 2017-12-22 DIAGNOSIS — Z931 Gastrostomy status: Secondary | ICD-10-CM | POA: Diagnosis not present

## 2017-12-22 DIAGNOSIS — Z431 Encounter for attention to gastrostomy: Secondary | ICD-10-CM | POA: Insufficient documentation

## 2017-12-22 HISTORY — PX: IR GASTROSTOMY TUBE REMOVAL: IMG5492

## 2017-12-22 MED ORDER — LIDOCAINE VISCOUS HCL 2 % MT SOLN
OROMUCOSAL | Status: DC | PRN
  Administered 2017-12-22: 15 mL

## 2017-12-22 MED ORDER — LIDOCAINE VISCOUS HCL 2 % MT SOLN
OROMUCOSAL | Status: AC
  Filled 2017-12-22: qty 15

## 2017-12-22 NOTE — Progress Notes (Signed)
Spoke with Carollee Herter at Dr Luan Moore office for clarification of orders for Philip Thompson.  Carollee Herter stated that Dr. Evette Cristal said that pt was eating appropriately for gastric and jejunal tube removal.  We will take g and j tubes out for Dr Evette Cristal.  [facility]-tR

## 2017-12-22 NOTE — Procedures (Signed)
Successful bedside removal of intact pull through GJ-tube. No immediate post procedural complications.  Please see imaging section of Epic for full dictation.  Lynnette Caffey, PA-C

## 2017-12-26 ENCOUNTER — Other Ambulatory Visit: Payer: Self-pay | Admitting: Family Medicine

## 2017-12-26 NOTE — Telephone Encounter (Signed)
4 refills on Eliquis and 6 refills on thyroid medicine

## 2017-12-27 DIAGNOSIS — Z7189 Other specified counseling: Secondary | ICD-10-CM | POA: Diagnosis not present

## 2017-12-27 DIAGNOSIS — S069X9A Unspecified intracranial injury with loss of consciousness of unspecified duration, initial encounter: Secondary | ICD-10-CM | POA: Diagnosis not present

## 2017-12-30 ENCOUNTER — Other Ambulatory Visit: Payer: Self-pay | Admitting: Family Medicine

## 2017-12-30 ENCOUNTER — Telehealth: Payer: Self-pay | Admitting: Family Medicine

## 2017-12-30 ENCOUNTER — Encounter: Payer: Self-pay | Admitting: Psychology

## 2017-12-30 MED ORDER — TRAMADOL HCL 50 MG PO TABS: ORAL_TABLET | ORAL | 0 refills | Status: DC

## 2017-12-30 NOTE — Telephone Encounter (Signed)
Pt needing a refill for traMADol (ULTRAM) 50 MG tablet.  Pharmacy:  [facility] - [place], [place] - [address]  [place]

## 2017-12-30 NOTE — Telephone Encounter (Signed)
Mother (DPR) notified 

## 2017-12-30 NOTE — Progress Notes (Signed)
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 COMPARISON  Comparison Score 1 Score 2 Difference Critical Value Base Rate  Spatial Addition - Symbol Span 8 8 0 2.74 -  Statistical significance (critical value) at the .05 level.    SUBTEST-LEVEL CONTRAST SCALED SCORES  Logical Memory  Score Score 1 Score 2 Contrast Scaled Score  LM II Recognition vs. Delayed Recall 26-50% 7 8  LM Immediate Recall vs. Delayed Recall 8 7 7    Verbal Paired Associates  Score Score 1 Score 2 Contrast Scaled Score  VPA II Recognition vs. Delayed Recall 26-50% 7 7  VPA Immediate Recall vs. Delayed Recall 7 7 10    Designs  Score Score 1 Score 2 Contrast Scaled Score  DE I Spatial vs. Content 5 9 11   DE II Spatial vs. Content 8 8 9   DE II Recognition vs. Delayed Recall 10-16% 7 9  DE Immediate Recall vs. Delayed Recall 7 7 8    Visual Reproduction  Score Score 1 Score 2 Contrast Scaled Score  VR II Recognition vs. Delayed Recall 26-50%  6 6  VR Immediate Recall vs. Delayed Recall 7 6 8     INDEX-LEVEL CONTRAST SCALED SCORES  WMS-IV Indexes  Score Score 1 Score 2 Contrast Scaled Score  Auditory Memory Index vs. Visual Memory Index 84 81 7  Visual Working Memory Index vs. Visual Memory Index 88 81 7  Immediate Memory Index vs. Delayed Memory Index 81 78 7    RAW SCORES  Subtest Score Range Adult Score Range Older Adult Raw Score  Brief Cognitive Status Exam 0-58 0-58 46  Logical Memory I 0-50 0-53 20  Logical Memory II 0-50 0-39 15  Verbal [facility]ired Associates I 0-56 0-40 24  Verbal [facility] Associates II 0-14 0-10 8  CVLT-II Trials 1-5 5-95 5-95   CVLT-II Long-Delay -5-5 -5-5   Designs I 0-120  60  Designs II 0-120  50  Visual Reproduction I 0-43 0-43 33  Visual Reproduction II 0-43 0-43 16  Spatial Addition 0-24  13  Symbol Span 0-50 0-50 23  Process Score Range Adult Score Range Older Adult Raw Score  LM II Recognition 0-30 0-23 24  VPA II Recognition 0-40 0-30 39  VPA II Word Recall 0-28 0-20   DE I Content 0-48  36  DE I Spatial 0-24  12  DE II Content 0-48  33  DE II Spatial 0-24  11  DE II Recognition 0-24  12  VR II Recognition 0-7 0-7 6  VR II Copy 0-43 0-43     ABILITY-MEMORY  ANALYSIS  Ability Score:  GAI: 101 Date of Testing:  WAIS-IV 2017/12/05; WMS-IV 2017/12/05  Predicted Difference Method   Index Predicted WMS-IV Index Score Actual WMS-IV Index Score Difference Critical Value  Significant Difference Y/N Base Rate  Auditory Memory 101 84 17 7.89 Y 10%  Visual Memory 101 81 20 8.82 Y 5%  Visual Working Memory 101 88 13 10.58 Y 10-15%  Immediate Memory 101 81 20 8.98 Y 4%  Delayed Memory 101 78 23 8.95 Y 3%  Statistical significance (critical value) at the .01 level.   Contrast Scaled Scores  Score Score 1 Score 2 Contrast Scaled Score  General Ability Index vs. Auditory Memory Index 101 84 6  General Ability Index vs. Visual Memory Index 101 81 5  General Ability Index vs.  Visual Working Memory Index 101 88 7  General Ability Index vs. Immediate Memory Index 101 81 4  General Ability Index vs. Delayed Memory Index 101 78 5  Verbal Comprehension Index vs. Auditory Memory Index 112 84 5  Perceptual Reasoning Index vs. Visual Memory Index 88 81 8  Perceptual Reasoning Index vs. Visual Working Memory Index 88 88 9  Working Memory Index vs. Auditory Memory Index 83 84 8  Working Memory Index vs. Visual Working Memory Index 83 88 10    End of Report

## 2017-12-30 NOTE — Telephone Encounter (Signed)
This medication was sent into his drugstore as requested

## 2018-01-02 ENCOUNTER — Encounter

## 2018-01-02 ENCOUNTER — Encounter: Payer: BLUE CROSS/BLUE SHIELD | Attending: Psychology | Admitting: Psychology

## 2018-01-02 DIAGNOSIS — Z8041 Family history of malignant neoplasm of ovary: Secondary | ICD-10-CM | POA: Diagnosis not present

## 2018-01-02 DIAGNOSIS — S069X2A Unspecified intracranial injury with loss of consciousness of 31 minutes to 59 minutes, initial encounter: Secondary | ICD-10-CM | POA: Diagnosis not present

## 2018-01-02 DIAGNOSIS — Z801 Family history of malignant neoplasm of trachea, bronchus and lung: Secondary | ICD-10-CM | POA: Diagnosis not present

## 2018-01-02 DIAGNOSIS — F988 Other specified behavioral and emotional disorders with onset usually occurring in childhood and adolescence: Secondary | ICD-10-CM | POA: Insufficient documentation

## 2018-01-02 DIAGNOSIS — Z8249 Family history of ischemic heart disease and other diseases of the circulatory system: Secondary | ICD-10-CM | POA: Diagnosis not present

## 2018-01-02 DIAGNOSIS — Z833 Family history of diabetes mellitus: Secondary | ICD-10-CM | POA: Insufficient documentation

## 2018-01-02 DIAGNOSIS — F333 Major depressive disorder, recurrent, severe with psychotic symptoms: Secondary | ICD-10-CM | POA: Diagnosis not present

## 2018-01-02 DIAGNOSIS — G8921 Chronic pain due to trauma: Secondary | ICD-10-CM | POA: Diagnosis not present

## 2018-01-02 DIAGNOSIS — Z811 Family history of alcohol abuse and dependence: Secondary | ICD-10-CM | POA: Diagnosis not present

## 2018-01-03 ENCOUNTER — Encounter: Payer: Self-pay | Admitting: Psychology

## 2018-01-03 NOTE — Progress Notes (Signed)
Today I provided feedback to the patient and his mother.  Philip Thompson is a 27 year old male who has a history of numerous traumatic brain injuries.  At the bottom of this you will see the summary and interpretations of the recent neuropsychological evaluation that can be found in his full report on the visit dated 01/11/2018.  Today, the patient and his mother were able to participate in the feedback.  However, there was clearly a lot of stress going on between the 2 of them and the patient was very critical of his mother.  It appears that this is been going on for several days and his mother is getting worn out with trying to deal with his impulsive aggression.  The patient reports that he is angry because he does not have access to his benzodiazepine and which the mother had taken to her house and forgot to bring back.  The patient was insisting that she drove back to her home which is some distance away to get the medication.  There have clearly ongoing issues and the patient clearly needs to continue with his psychiatric visits as well as his therapeutic efforts.  Summary of Results:                        Overall, the results of the current neuropsychological evaluation are consistent with significant cognitive deficits and neuropsychological deficits with regard to visual spatial and visual analysis abilities, auditory and visual encoding abilities, information processing speed.  The patient showed preserved and well functioning abilities with regard to his verbal comprehension and verbal reasoning abilities.  With regard to memory functions, the patient shows significant memory deficits for both auditory and visual memory.  Initially, his auditory and visual encoding abilities are impaired and fall in the mild to moderate range of impairment.  The patient's initial learning of both visual and auditory information after encoding was also mild to moderate impairment.  The patient also showed moderate  deficits with regard to delayed memory and his performance did not improve significantly with cueing.  This would suggest that his deficits are clearly within the encoding and storage aspect of memory deficits and not simply retrieval deficits.  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  While the patient clearly shows significant neuropsychological deficits, these are not severely impaired but in fact low average levels of functioning with regard to most areas assessed.  The patient was likely quite bright and intelligent prior to these recurrent traumatic brain injuries.  The deficits that are identified include visual-spatial/visual reasoning abilities, auditory and visual encoding deficits, slowed information processing speed.  The patient has difficulty with long-term memory for both visual and auditory information.  The patient also has a long history of severe depressive events that have included suicide attempts that could have easily been lethal as well as causing further brain injury.  As far as ongoing treatment, the patient continues to possess good verbal comprehension skills and clearly has the ability to learn new information and benefit from retraining skills or learning new skills with adequate effort and perseverance.  The patient's significant mood disorder including major depression and anxiety as well as mood and impulse control secondary to significant diffuse axonal injury and/or frontal lobe injury  will need to be continually addressed.  I do think the patient should actively participate in psychotherapeutic interventions around adjusting to the late effects of his traumatic brain injuries.  It is impossible to identify the specific events causing various deficits as they are likely to of had a  compounding effect.  I also think that we should look at psychotropic interventions and active psychiatric care.  The patient does retain high average abilities with regard to verbal comprehension abilities including his verbal reasoning and problem-solving, his vocabulary, and his social judgment and reasoning abilities.  These should be the primary focus of therapeutic interventions for adjustment and coping as they are clearly his strengths going forward.  These retained abilities should be utilized to facilitate adjustment and coping to issues related to information processing speed and learning/memory deficits.  The patient's memory deficits are not so severe that he is not able to learn new information and we should really attempt to help this patient further adapt and cope with his residual late effects of his traumatic brain injury.  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 Neuropsychologist

## 2018-01-05 ENCOUNTER — Ambulatory Visit: Payer: BLUE CROSS/BLUE SHIELD | Admitting: Family Medicine

## 2018-01-05 DIAGNOSIS — K56609 Unspecified intestinal obstruction, unspecified as to partial versus complete obstruction: Secondary | ICD-10-CM | POA: Diagnosis not present

## 2018-01-05 DIAGNOSIS — Z933 Colostomy status: Secondary | ICD-10-CM | POA: Diagnosis not present

## 2018-01-09 ENCOUNTER — Ambulatory Visit: Payer: BLUE CROSS/BLUE SHIELD | Admitting: Family Medicine

## 2018-01-17 ENCOUNTER — Ambulatory Visit (HOSPITAL_COMMUNITY): Payer: Self-pay | Admitting: Licensed Clinical Social Worker

## 2018-01-23 DIAGNOSIS — F3181 Bipolar II disorder: Secondary | ICD-10-CM | POA: Diagnosis not present

## 2018-01-27 DIAGNOSIS — S069X9A Unspecified intracranial injury with loss of consciousness of unspecified duration, initial encounter: Secondary | ICD-10-CM | POA: Diagnosis not present

## 2018-01-30 ENCOUNTER — Ambulatory Visit: Payer: Self-pay | Admitting: Psychology

## 2018-01-30 ENCOUNTER — Ambulatory Visit: Payer: BLUE CROSS/BLUE SHIELD | Admitting: Family Medicine

## 2018-01-31 DIAGNOSIS — Z932 Ileostomy status: Secondary | ICD-10-CM | POA: Diagnosis not present

## 2018-02-06 DIAGNOSIS — K56609 Unspecified intestinal obstruction, unspecified as to partial versus complete obstruction: Secondary | ICD-10-CM | POA: Diagnosis not present

## 2018-02-06 DIAGNOSIS — Z933 Colostomy status: Secondary | ICD-10-CM | POA: Diagnosis not present

## 2018-02-09 ENCOUNTER — Ambulatory Visit: Payer: Self-pay | Admitting: Psychology

## 2018-02-17 DIAGNOSIS — K56609 Unspecified intestinal obstruction, unspecified as to partial versus complete obstruction: Secondary | ICD-10-CM | POA: Diagnosis not present

## 2018-02-17 DIAGNOSIS — Z933 Colostomy status: Secondary | ICD-10-CM | POA: Diagnosis not present

## 2018-02-20 ENCOUNTER — Encounter (HOSPITAL_COMMUNITY): Payer: Self-pay | Admitting: Physician Assistant

## 2018-03-03 ENCOUNTER — Ambulatory Visit: Payer: BLUE CROSS/BLUE SHIELD | Admitting: Family Medicine

## 2018-03-03 ENCOUNTER — Encounter: Payer: Self-pay | Admitting: Family Medicine

## 2018-03-03 VITALS — BP 118/82 | Wt 190.6 lb

## 2018-03-03 DIAGNOSIS — I2699 Other pulmonary embolism without acute cor pulmonale: Secondary | ICD-10-CM | POA: Diagnosis not present

## 2018-03-03 DIAGNOSIS — Z933 Colostomy status: Secondary | ICD-10-CM | POA: Diagnosis not present

## 2018-03-03 DIAGNOSIS — K2900 Acute gastritis without bleeding: Secondary | ICD-10-CM | POA: Diagnosis not present

## 2018-03-03 DIAGNOSIS — L03311 Cellulitis of abdominal wall: Secondary | ICD-10-CM

## 2018-03-03 DIAGNOSIS — K56609 Unspecified intestinal obstruction, unspecified as to partial versus complete obstruction: Secondary | ICD-10-CM | POA: Diagnosis not present

## 2018-03-03 MED ORDER — TRAMADOL HCL 50 MG PO TABS: ORAL_TABLET | ORAL | 0 refills | Status: DC

## 2018-03-03 MED ORDER — TRIAMCINOLONE ACETONIDE 0.1 % EX CREA: 1.0000 "application " | TOPICAL_CREAM | Freq: Two times a day (BID) | CUTANEOUS | 0 refills | Status: DC

## 2018-03-03 MED ORDER — DOXYCYCLINE HYCLATE 100 MG PO TABS: 100.0000 mg | ORAL_TABLET | Freq: Two times a day (BID) | ORAL | 0 refills | Status: DC

## 2018-03-03 MED ORDER — APIXABAN 5 MG PO TABS: 5.0000 mg | ORAL_TABLET | Freq: Two times a day (BID) | ORAL | 3 refills | Status: DC

## 2018-03-03 NOTE — Progress Notes (Signed)
   Subjective:    Patient ID: Philip Thompson, male    DOB: Aug 25, 1990, 28 y.o.   MRN: [id number]  HPI Patient arrives for a follow up on Eliquis. Patient needs refills of eliquis and tramadol. Patient also has a knot on his stomach near his surgical sit that is tender and has been having issues with vomiting. This patient has a history of pulmonary embolus in September Had a colectomy He has a colostomy bag Along the left lateral aspect there is a slight tenderness He states is been present for the past few days Not draining any pus He also relates intermittent gastritis symptoms.  But is been taking his acid blocker Denies coffee ground emesis denies melena  Review of Systems  Constitutional: Negative for diaphoresis and fatigue.  HENT: Negative for congestion and rhinorrhea.   Respiratory: Negative for cough and shortness of breath.   Cardiovascular: Negative for chest pain and leg swelling.  Gastrointestinal: Negative for abdominal pain and diarrhea.  Skin: Negative for color change and rash.  Neurological: Negative for dizziness and headaches.  Psychiatric/Behavioral: Negative for behavioral problems and confusion.       Objective:   Physical Exam Vitals signs reviewed.  Constitutional:      General: He is not in acute distress. HENT:     Head: Normocephalic and atraumatic.  Eyes:     General:        Right eye: No discharge.        Left eye: No discharge.  Neck:     Trachea: No tracheal deviation.  Cardiovascular:     Rate and Rhythm: Normal rate and regular rhythm.     Heart sounds: Normal heart sounds. No murmur.  Pulmonary:     Effort: Pulmonary effort is normal. No respiratory distress.     Breath sounds: Normal breath sounds.  Lymphadenopathy:     Cervical: No cervical adenopathy.  Skin:    General: Skin is warm and dry.  Neurological:     Mental Status: He is alert.     Coordination: Coordination normal.  Psychiatric:        Behavior: Behavior normal.      Minimal cellulitis on the left side of the colostomy region       Assessment & Plan:  Gastritis continue pepcid OTC In addition to this recommend referral to gastroenterology probable EGD  Pulmonary embolus continue Eliquis no sign of bleeding issues recommend consultation with hematology to see if it is recommended for this patient to be on this more than 6 months or stop it 6 months  More than likely pulmonary embolus related to the severe illness issues he was having last August September when he had the colectomy he has not had any problems since and no sign of cancer or other underlying trigger may or may not need blood work to look for hypercoagulability-we will leave this all to hematology  Early cellulitis doxycycline twice daily for the next 7 to 10 days should gradually get better without any major intervention warning signs discussed

## 2018-03-08 DIAGNOSIS — E039 Hypothyroidism, unspecified: Secondary | ICD-10-CM | POA: Diagnosis not present

## 2018-03-08 DIAGNOSIS — D509 Iron deficiency anemia, unspecified: Secondary | ICD-10-CM | POA: Diagnosis not present

## 2018-03-08 DIAGNOSIS — N289 Disorder of kidney and ureter, unspecified: Secondary | ICD-10-CM | POA: Diagnosis not present

## 2018-03-09 ENCOUNTER — Encounter: Payer: Self-pay | Admitting: Family Medicine

## 2018-03-09 LAB — CBC WITH DIFFERENTIAL/PLATELET
BASOS ABS: 0 10*3/uL (ref 0.0–0.2)
Basos: 1 %
EOS (ABSOLUTE): 0.2 10*3/uL (ref 0.0–0.4)
Eos: 3 %
HEMATOCRIT: 38.6 % (ref 37.5–51.0)
Hemoglobin: 12.5 g/dL — ABNORMAL LOW (ref 13.0–17.7)
Immature Grans (Abs): 0 10*3/uL (ref 0.0–0.1)
Immature Granulocytes: 0 %
LYMPHS ABS: 1.9 10*3/uL (ref 0.7–3.1)
Lymphs: 27 %
MCH: 27.4 pg (ref 26.6–33.0)
MCHC: 32.4 g/dL (ref 31.5–35.7)
MCV: 85 fL (ref 79–97)
MONOCYTES: 10 %
MONOS ABS: 0.7 10*3/uL (ref 0.1–0.9)
NEUTROS ABS: 4.1 10*3/uL (ref 1.4–7.0)
Neutrophils: 59 %
Platelets: 149 10*3/uL — ABNORMAL LOW (ref 150–450)
RBC: 4.56 x10E6/uL (ref 4.14–5.80)
RDW: 14.4 % (ref 11.6–15.4)
WBC: 7 10*3/uL (ref 3.4–10.8)

## 2018-03-09 LAB — BASIC METABOLIC PANEL
BUN / CREAT RATIO: 11 (ref 9–20)
BUN: 10 mg/dL (ref 6–20)
CHLORIDE: 100 mmol/L (ref 96–106)
CO2: 28 mmol/L (ref 20–29)
Calcium: 9.4 mg/dL (ref 8.7–10.2)
Creatinine, Ser: 0.92 mg/dL (ref 0.76–1.27)
GFR calc non Af Amer: 114 mL/min/{1.73_m2} (ref >59)
GFR, EST AFRICAN AMERICAN: 131 mL/min/{1.73_m2} (ref >59)
Glucose: 82 mg/dL (ref 65–99)
POTASSIUM: 4.6 mmol/L (ref 3.5–5.2)
SODIUM: 143 mmol/L (ref 134–144)

## 2018-03-09 LAB — FERRITIN: FERRITIN: 32 ng/mL (ref 30–400)

## 2018-03-09 LAB — TSH: TSH: 0.787 u[IU]/mL (ref 0.450–4.500)

## 2018-03-10 ENCOUNTER — Other Ambulatory Visit: Payer: Self-pay | Admitting: Family Medicine

## 2018-03-10 DIAGNOSIS — D509 Iron deficiency anemia, unspecified: Secondary | ICD-10-CM

## 2018-03-14 ENCOUNTER — Encounter: Payer: Self-pay | Admitting: Family Medicine

## 2018-03-15 ENCOUNTER — Telehealth: Payer: Self-pay | Admitting: Family Medicine

## 2018-03-15 DIAGNOSIS — Z932 Ileostomy status: Secondary | ICD-10-CM | POA: Diagnosis not present

## 2018-03-15 NOTE — Telephone Encounter (Signed)
DPR printed and sent to listed address on file

## 2018-03-16 ENCOUNTER — Inpatient Hospital Stay (HOSPITAL_COMMUNITY): Payer: BLUE CROSS/BLUE SHIELD | Attending: Internal Medicine | Admitting: Internal Medicine

## 2018-03-16 ENCOUNTER — Other Ambulatory Visit: Payer: Self-pay

## 2018-03-16 ENCOUNTER — Inpatient Hospital Stay (HOSPITAL_COMMUNITY): Payer: BLUE CROSS/BLUE SHIELD

## 2018-03-16 ENCOUNTER — Encounter (HOSPITAL_COMMUNITY): Payer: Self-pay | Admitting: Internal Medicine

## 2018-03-16 VITALS — BP 99/58 | HR 85 | Resp 16 | Ht 68.0 in | Wt 202.0 lb

## 2018-03-16 DIAGNOSIS — Z7901 Long term (current) use of anticoagulants: Secondary | ICD-10-CM | POA: Diagnosis not present

## 2018-03-16 DIAGNOSIS — I2699 Other pulmonary embolism without acute cor pulmonale: Secondary | ICD-10-CM | POA: Diagnosis not present

## 2018-03-16 DIAGNOSIS — M1990 Unspecified osteoarthritis, unspecified site: Secondary | ICD-10-CM

## 2018-03-16 DIAGNOSIS — K5909 Other constipation: Secondary | ICD-10-CM | POA: Diagnosis not present

## 2018-03-16 DIAGNOSIS — R634 Abnormal weight loss: Secondary | ICD-10-CM | POA: Diagnosis not present

## 2018-03-16 DIAGNOSIS — M25559 Pain in unspecified hip: Secondary | ICD-10-CM

## 2018-03-16 LAB — CBC WITH DIFFERENTIAL/PLATELET
Abs Immature Granulocytes: 0.02 10*3/uL (ref 0.00–0.07)
BASOS PCT: 1 %
Basophils Absolute: 0 10*3/uL (ref 0.0–0.1)
Eosinophils Absolute: 0.3 10*3/uL (ref 0.0–0.5)
Eosinophils Relative: 5 %
HCT: 41.6 % (ref 39.0–52.0)
Hemoglobin: 12.9 g/dL — ABNORMAL LOW (ref 13.0–17.0)
Immature Granulocytes: 0 %
Lymphocytes Relative: 36 %
Lymphs Abs: 2 10*3/uL (ref 0.7–4.0)
MCH: 26.8 pg (ref 26.0–34.0)
MCHC: 31 g/dL (ref 30.0–36.0)
MCV: 86.5 fL (ref 80.0–100.0)
Monocytes Absolute: 0.6 10*3/uL (ref 0.1–1.0)
Monocytes Relative: 10 %
NEUTROS ABS: 2.6 10*3/uL (ref 1.7–7.7)
Neutrophils Relative %: 48 %
PLATELETS: UNDETERMINED 10*3/uL (ref 150–400)
RBC: 4.81 MIL/uL (ref 4.22–5.81)
RDW: 14.7 % (ref 11.5–15.5)
WBC: 5.5 10*3/uL (ref 4.0–10.5)
nRBC: 0 % (ref 0.0–0.2)

## 2018-03-16 LAB — COMPREHENSIVE METABOLIC PANEL
ALT: 14 U/L (ref 0–44)
AST: 15 U/L (ref 15–41)
Albumin: 4.1 g/dL (ref 3.5–5.0)
Alkaline Phosphatase: 71 U/L (ref 38–126)
Anion gap: 6 (ref 5–15)
BUN: 8 mg/dL (ref 6–20)
CO2: 24 mmol/L (ref 22–32)
Calcium: 9 mg/dL (ref 8.9–10.3)
Chloride: 108 mmol/L (ref 98–111)
Creatinine, Ser: 0.75 mg/dL (ref 0.61–1.24)
GFR calc non Af Amer: >60 mL/min (ref >60)
Glucose, Bld: 117 mg/dL — ABNORMAL HIGH (ref 70–99)
Potassium: 4.4 mmol/L (ref 3.5–5.1)
Sodium: 138 mmol/L (ref 135–145)
Total Bilirubin: 0.2 mg/dL — ABNORMAL LOW (ref 0.3–1.2)
Total Protein: 6.8 g/dL (ref 6.5–8.1)

## 2018-03-16 LAB — SEDIMENTATION RATE: Sed Rate: 2 mm/hr (ref 0–16)

## 2018-03-16 LAB — LACTATE DEHYDROGENASE: LDH: 104 U/L (ref 98–192)

## 2018-03-16 LAB — FERRITIN: Ferritin: 24 ng/mL (ref 24–336)

## 2018-03-16 LAB — C-REACTIVE PROTEIN: CRP: <0.8 mg/dL (ref <1.0)

## 2018-03-16 NOTE — Progress Notes (Signed)
Referring Physician:  Dr. Gerda DissLuking.    Diagnosis Pain in joint involving pelvic region and thigh, unspecified laterality - Plan: Lupus anticoagulant panel, Beta-2-glycoprotein i abs, IgG/M/A, CBC with Differential, Comprehensive metabolic panel, Lactate dehydrogenase, Factor 5 leiden, Prothrombin gene mutation, ANA, IFA (with reflex), Rheumatoid factor, Sedimentation rate, C-reactive protein, Ferritin, Protein electrophoresis, serum, CT ANGIO CHEST PE W OR WO CONTRAST, US Venous Img Lower Bilateral  Other constipation - Plan: Lupus anticoagulant panel, Beta-2-glycoprotein i abs, IgG/M/A, CBC with Differential, Comprehensive metabolic panel, Lactate dehydrogenase, Factor 5 leiden, Prothrombin gene mutation, ANA, IFA (with reflex), Rheumatoid factor, Sedimentation rate, C-reactive protein, Ferritin, Protein electrophoresis, serum, CT ABDOMEN PELVIS W CONTRAST, CT ANGIO CHEST PE W OR WO CONTRAST, US Venous Img Lower Bilateral  Abnormal weight loss - Plan: Lupus anticoagulant panel, Beta-2-glycoprotein i abs, IgG/M/A, CBC with Differential, Comprehensive metabolic panel, Lactate dehydrogenase, Factor 5 leiden, Prothrombin gene mutation, ANA, IFA (with reflex), Rheumatoid factor, Sedimentation rate, C-reactive protein, Ferritin, Protein electrophoresis, serum, CT ABDOMEN PELVIS W CONTRAST, CT ANGIO CHEST PE W OR WO CONTRAST, US Venous Img Lower Bilateral  Other acute pulmonary embolism without acute cor pulmonale (HCC) - Plan: Lupus anticoagulant panel, Beta-2-glycoprotein i abs, IgG/M/A, CBC with Differential, Comprehensive metabolic panel, Lactate dehydrogenase, Factor 5 leiden, Prothrombin gene mutation, ANA, IFA (with reflex), Rheumatoid factor, Sedimentation rate, C-reactive protein, Ferritin, Protein electrophoresis, serum, CT ANGIO CHEST PE W OR WO CONTRAST, US Venous Img Lower Bilateral  Staging Cancer Staging No matching staging information was found for the patient.  Assessment and Plan:  171.  PE.   28 year old male diagnosed with PE in 09/2017.  PE was diagnosed prior to surgery. He denied any frequent travel.   Pt had CT angio done 09/22/2017 that showed Impression: 1.  bilateral pulmonary emboli. No evidence of right heart strain. 2. Diffuse emphysematous changes in the lungs. Patchy airspace disease may represent pneumonia or infarcts. 3. Massive distention of the colon throughout. Colon is filled with gas and mostly stool. Large amount of stool in the rectum with rectal wall thickening may indicate stercoral colitis.   He was started on IV heparin for pulmonary emboli.  Cardiology service is consulted.  He was maintained on anticoagulation initially with Xarelto, but held due to drain site bleeding.  IVC filter placed 10/02/2017 per interventional radiology to be retrieved after 4 weeks.  Placed on lovenox, transitioned to Eliquis.  Pt currently remains on Eliquis.  He denies family history of blood clots.  He has family history of CAD and CVAs on mother's side.    Labs done today 03/16/2018 reviewed and showed WBC 5.5 HB 12.9 plts > 100,000.  Chemistries WNL with K+ 4.4 Cr 0.75 and normal LFTs.  Prior CBC done 03/08/2018 showed WBC 7 HB 12.5 plts 149,000.  Pt will undergo hypercoagulable workup today.  He will be set up for CT angio of chest as will as bilateral LE dopplers for follow-up of PE history.  Pt will RTC to go over labs and imaging results.  He should continue Eliquis as recommended. Further recommendations will follow once labs and imaging has been reviewed.    2.  Chronic constipation.  Pt has history of multiple TBIs, anoxic brain injury, chronic pain, polysubstance abuse, chronic constipation.  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  He was transferred to [facility] for further treatment.    He was taken to the operating room for subtotal colectomy,  ileostomy 09/27/2017 by Dr. Magnus Ivan required  intubation, sedation nasogastric tube, TPN for nutritional support. He was weaned from ventilator requiring tracheostomy, 10/05/2017, decannulated 10/20/2017.   Pathology from colectomy showed  Impression:CONGESTION AND FOCAL ISCHEMIC CHANGES. - MARGINS APPEAR VIABLE. - UNREMARKABLE APPENDIX.  No malignancy was identified.    Pt is being considered for ileostomy reversal.  Pt is set up for CT abdomen and pelvis for follow-up.  He will RTC to go over results.   3.  Smoking.  Cessation recommended.  Pt will undergo CTA of chest for interval follow-up of PE.    4  Polysubstance abuse.  Likely etiology of chronic constipation.  Follow-up with Psychology and PCP as directed.    5.  Family history of lung cancer and uterine cancer.  Pt reports this occurred in grandparents.    40 minutes spent with more than 50% spent in review of records, counseling and coordination of care.    HPI:  27 year old male referred for evaluation due to pulmonary emboli.  Pt had longstanding problem with constipation.  He reports he never had GI evaluation.  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  He was transferred to [facility] for further treatment.    CT angio done 09/22/2017 showed Impression: 1.  bilateral pulmonary emboli. No evidence of right heart strain. 2. Diffuse emphysematous changes in the lungs. Patchy airspace disease may represent pneumonia or infarcts. 3. Massive distention of the colon throughout. Colon is filled with gas and mostly stool. Large amount of stool in the rectum with rectal wall thickening may indicate stercoral colitis.   Dr. August Saucer consulted for input.  Recommended Neostigmine trial with monitoring.  He was started on IV heparin for pulmonary emboli.  Cardiology service is consulted.  He was  maintained on anticoagulation initially with Xarelto, but held due to drain site bleeding.  IVC filter placed 10/02/2017 per interventional radiology to be retrieved after 4 weeks.  Placed on lovenox, transitioned to Eliquis.    He was taken to the operating room for subtotal colectomy, ileostomy 09/27/2017 by Dr. Magnus Ivan required intubation, sedation nasogastric tube, TPN for nutritional support. He was weaned from ventilator requiring tracheostomy, 10/05/2017, decannulated 10/20/2017.    Psychiatry consulted for input due to suicidal ideation and worsening of anxiety, poor sleep.  Initially maintained on Precedex for agitation, slowly weaned.  He continued to have high volume output from NG tube and gastrostomy tube was placed by Dr. Loreta Ave, 10/17/2017.  Pt had IR gastrostomy tube removal on 12/22/2017.   Pt denied recent travel around 09/2017.  He denies any family history of blood clots.  Grandmother had lung cancer and uterine cancer.  He has long smoking history and continues to smoke.  Pt reports he is being considered for ileostomy reversal.  Pt is seen today for consultation due to PE.    Problem List Patient Active Problem List   Diagnosis Date Noted  . Acute blood loss anemia [D62]   . Labile blood glucose [R73.09]   . Hypoglycemia [E16.2]   . Hyponatremia [E87.1]   . Debility [R53.81] 10/21/2017  . Anxiety and depression [F41.9, F32.9]   . Protein calorie malnutrition (HCC) [E46]   . Tracheostomy care (HCC) [Z43.0]   . Tracheostomy in place (HCC) [Z93.0]   . History of traumatic brain injury [Z87.820]   . Anoxic brain  injury (HCC) [G93.1]   . Chronic pain syndrome [G89.4]   . Sinus tachycardia [R00.0]   . Tachypnea [R06.82]   . Leukocytosis [D72.829]   . Pressure injury of skin [L89.90] 10/08/2017  . Acute respiratory failure with hypoxemia (HCC) [J96.01]   . Protein-calorie malnutrition, severe [E43] 09/28/2017  . Preoperative cardiovascular examination [Z01.810]   . Acute  respiratory failure with hypoxia (HCC) [J96.01] 09/22/2017  . Acute pulmonary embolism without acute cor pulmonale (HCC) [I26.99] 09/22/2017  . Hypothyroidism [E03.9] 09/22/2017  . Colon distention [K63.89]   . 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 Thrombocytopenia (HCC) [D69.6] 09/18/2017  . Hemoptysis [R04.2] 09/18/2017  . Megacolon [K59.39] 09/18/2017  . Fecal impaction (HCC) [K56.41]   . Impaction of colon (HCC) [K56.49]   . Obstipation [K59.00] 09/16/2017  . Constipation [K59.00]   . Leg edema [R60.0]   . Acute megacolon (HCC) [K59.31] 09/15/2017  . Hypokalemia [E87.6] 09/15/2017  . Tobacco use [Z72.0] 09/15/2017  . Traumatic brain injury (HCC) [S06.9X9A] 04/19/2017  . Mild cognitive disorder [F09] 04/19/2017  . MDD (major depressive disorder), recurrent severe, without psychosis (HCC) [F33.2] 03/25/2017  . Polysubstance abuse (HCC) [F19.10] 10/15/2016  . Substance or medication-induced bipolar and related disorder with onset during intoxication (HCC) [E95.284, F19.94] 07/05/2016  . Opioid use disorder, moderate, dependence (HCC) [F11.20] 07/05/2016  . Cannabis use disorder, severe, dependence (HCC) [F12.20] 07/05/2016  . Chronic back pain [M54.9, G89.29] 07/05/2012    Past Medical History Past Medical History:  Diagnosis Date  . ADD (attention deficit disorder)   . Aggression   . Allergic rhinitis   . Anxiety   . Arthritis    joint pain  . Cardiomyopathy (HCC)    a. EF <25% in 2015 during admission for multi substance intoxication/respiratory failure requiring intubation.  . Chronic back pain   . Constipation   . Depression   . Fecal obstruction (HCC)   . Frontal head injury   . Heroin overdose (HCC)   . Obstipation   . Polysubstance abuse (HCC)   . Suicide attempt [facility])     Past Surgical History Past Surgical History:  Procedure Laterality Date  . COLECTOMY N/A 09/27/2017   Procedure: SUBTOTAL COLECTOMY AND ILEOSTOMY;  Surgeon: Abigail Miyamoto, MD;   Location: [facility] OR;  Service: General;  Laterality: N/A;  . FOOT SURGERY     to get a BB out  . IR GASTR TUBE CONVERT GASTR-JEJ PER W/FL MOD SED  10/19/2017  . IR GASTROSTOMY TUBE MOD SED  10/17/2017  . IR GASTROSTOMY TUBE REMOVAL  12/22/2017  . IR IVC FILTER PLMT / S&I /IMG GUID/MOD SED  10/02/2017  . NO PAST SURGERIES      Family History Family History  Problem Relation Age of Onset  . Dementia Father   . Alcohol abuse Father   . Mental illness Brother   . Diabetes Brother   . Cancer - Ovarian Maternal Grandmother   . Cancer - Lung Paternal Grandmother   . Cancer Paternal Grandfather   . Diabetes Other   . Hypertension Other   . Heart disease Other   . Heart attack Other   . Fibromyalgia Mother   . Thyroid disease Mother   . Colitis Mother   . Spina bifida Sister   . Mental illness Sister   . Hepatitis C Sister   . Mental illness Brother      Social History  reports that he has been smoking cigarettes. He has a 10.00 pack-year smoking history. He has never  used smokeless tobacco. He reports previous alcohol use. He reports current drug use. Drugs: Marijuana and Cocaine.  Medications  Current Outpatient Medications:  .  apixaban (ELIQUIS) 5 MG TABS tablet, Take 1 tablet (5 mg total) by mouth 2 (two) times daily., Disp: 60 tablet, Rfl: 3 .  busPIRone (BUSPAR) 15 MG tablet, , Disp: , Rfl:  .  citalopram (CELEXA) 40 MG tablet, , Disp: , Rfl:  .  clonazePAM (KLONOPIN) 1 MG tablet, Place 1 tablet (1 mg total) into feeding tube every 8 (eight) hours., Disp: 90 tablet, Rfl: 0 .  divalproex (DEPAKOTE) 500 MG DR tablet, , Disp: , Rfl:  .  doxycycline (VIBRA-TABS) 100 MG tablet, Take 1 tablet (100 mg total) by mouth 2 (two) times daily., Disp: 14 tablet, Rfl: 0 .  famotidine (PEPCID) 20 MG tablet, , Disp: , Rfl: 0 .  fluticasone (FLONASE) 50 MCG/ACT nasal spray, Place 2 sprays into both nostrils daily., Disp: , Rfl: 2 .  levothyroxine (SYNTHROID, LEVOTHROID) 75 MCG tablet, TAKE ONE  TABLET EACH MORNING BEFORE BREAKFAST, Disp: 30 tablet, Rfl: 5 .  mupirocin ointment (BACTROBAN) 2 %, Apply BID to base of Gtube., Disp: 22 g, Rfl: 0 .  propranolol (INDERAL) 40 MG tablet, , Disp: , Rfl:  .  QUEtiapine (SEROQUEL) 400 MG tablet, , Disp: , Rfl:  .  traMADol (ULTRAM) 50 MG tablet, One tid prn pain use sparingly, Disp: 15 tablet, Rfl: 0 .  traZODone (DESYREL) 150 MG tablet, , Disp: , Rfl:  .  triamcinolone cream (KENALOG) 0.1 %, Apply 1 application topically 2 (two) times daily., Disp: 30 g, Rfl: 0  Allergies Patient has no known allergies.  Review of Systems Review of Systems - Oncology ROS negative   Physical Exam  Vitals Wt Readings from Last 3 Encounters:  03/16/18 202 lb (91.6 kg)  03/03/18 190 lb 9.6 oz (86.5 kg)  11/23/17 158 lb 0.4 oz (71.7 kg)   Temp Readings from Last 3 Encounters:  10/26/17 98.2 F (36.8 C)  10/21/17 98.3 F (36.8 C) (Oral)  09/19/17 98.1 F (36.7 C) (Oral)   BP Readings from Last 3 Encounters:  03/16/18 (!) 99/58  03/03/18 118/82  11/23/17 96/68   Pulse Readings from Last 3 Encounters:  03/16/18 85  11/09/17 94  10/26/17 (!) 102   Constitutional: Well-developed, well-nourished, and in no distress.   HENT: Head: Normocephalic and atraumatic.  Mouth/Throat: No oropharyngeal exudate. Mucosa moist. Eyes: Pupils are equal, round, and reactive to light. Conjunctivae are normal. No scleral icterus.  Neck: Normal range of motion. Neck supple. No JVD present.  Cardiovascular: Normal rate, regular rhythm and normal heart sounds.  Exam reveals no gallop and no friction rub.   No murmur heard. Pulmonary/Chest: Effort normal and breath sounds normal. No respiratory distress. No wheezes.No rales.  Abdominal: Soft. Bowel sounds are normal. Scar noted on abdomen from prior surgery.  Ileostomy noted.  Diffuse tenderness to palpation.  There is no guarding.  Musculoskeletal: No edema or tenderness.  Lymphadenopathy: No cervical,axillary or  supraclavicular adenopathy.  Neurological: Alert and oriented to person, place, and time. No cranial nerve deficit.  Skin: Skin is warm and dry. No rash noted. No erythema. No pallor.  Psychiatric: Affect and judgment normal.   Labs Appointment on 03/16/2018  Component Date Value Ref Range Status  . WBC 03/16/2018 5.5  4.0 - 10.5 K/uL Final  . RBC 03/16/2018 4.81  4.22 - 5.81 MIL/uL Final  . Hemoglobin 03/16/2018 12.9* 13.0 - 17.0 g/dL  Final  . HCT 03/16/2018 41.6  39.0 - 52.0 % Final  . MCV 03/16/2018 86.5  80.0 - 100.0 fL Final  . MCH 03/16/2018 26.8  26.0 - 34.0 pg Final  . MCHC 03/16/2018 31.0  30.0 - 36.0 g/dL Final  . RDW 66/06/3014 14.7  11.5 - 15.5 % Final  . Platelets 03/16/2018 PLATELET CLUMPS NOTED ON SMEAR, UNABLE TO ESTIMATE  150 - 400 K/uL Final   PLATELET CLUMPING, SUGGEST RECOLLECTION OF SAMPLE IN CITRATE TUBE.  Marland Kitchen nRBC 03/16/2018 0.0  0.0 - 0.2 % Final  . Neutrophils Relative % 03/16/2018 48  % Final  . Neutro Abs 03/16/2018 2.6  1.7 - 7.7 K/uL Final  . Lymphocytes Relative 03/16/2018 36  % Final  . Lymphs Abs 03/16/2018 2.0  0.7 - 4.0 K/uL Final  . Monocytes Relative 03/16/2018 10  % Final  . Monocytes Absolute 03/16/2018 0.6  0.1 - 1.0 K/uL Final  . Eosinophils Relative 03/16/2018 5  % Final  . Eosinophils Absolute 03/16/2018 0.3  0.0 - 0.5 K/uL Final  . Basophils Relative 03/16/2018 1  % Final  . Basophils Absolute 03/16/2018 0.0  0.0 - 0.1 K/uL Final  . Immature Granulocytes 03/16/2018 0  % Final  . Abs Immature Granulocytes 03/16/2018 0.02  0.00 - 0.07 K/uL Final   Performed at [facility], [address] [place], [place] [address]  . Sodium 03/16/2018 138  135 - 145 mmol/L Final  . Potassium 03/16/2018 4.4  3.5 - 5.1 mmol/L Final  . Chloride 03/16/2018 108  98 - 111 mmol/L Final  . CO2 03/16/2018 24  22 - 32 mmol/L Final  . Glucose, Bld 03/16/2018 117* 70 - 99 mg/dL Final  . BUN 23/55/7322 8  6 - 20 mg/dL Final  . Creatinine, Ser 03/16/2018 0.75  0.61 -  1.24 mg/dL Final  . Calcium 02/54/2706 9.0  8.9 - 10.3 mg/dL Final  . Total Protein 03/16/2018 6.8  6.5 - 8.1 g/dL Final  . Albumin 23/76/2831 4.1  3.5 - 5.0 g/dL Final  . AST 51/76/1607 15  15 - 41 U/L Final  . ALT 03/16/2018 14  0 - 44 U/L Final  . Alkaline Phosphatase 03/16/2018 71  38 - 126 U/L Final  . Total Bilirubin 03/16/2018 0.2* 0.3 - 1.2 mg/dL Final  . GFR calc non Af Amer 03/16/2018 >60  >60 mL/min Final  . GFR calc Af Amer 03/16/2018 >60  >60 mL/min Final  . Anion gap 03/16/2018 6  5 - 15 Final   Performed at [facility], [address], [place], [place] [address]  . LDH 03/16/2018 104  98 - 192 U/L Final   Performed at [facility], [address], [place], [place] [address]  . Sed Rate 03/16/2018 2  0 - 16 mm/hr Final   Performed at [facility], [address], [place], [place] [address]  . Ferritin 03/16/2018 24  24 - 336 ng/mL Final   Performed at [facility], [address] [place], [place] [address]     Pathology Orders Placed This Encounter  Procedures  . CT ABDOMEN PELVIS W CONTRAST    Standing Status:   Future    Standing Expiration Date:   03/16/2019    Order Specific Question:   If indicated for the ordered procedure, I authorize the administration of contrast media per Radiology protocol    Answer:   Yes    Order Specific Question:   Preferred imaging location?    Answer:  [facility]    Order Specific Question:   Is Oral Contrast requested for this exam?    Answer:   Yes, Per Radiology protocol    Order Specific Question:   Radiology Contrast Protocol - do NOT remove file path    Answer:   \\[email or web address]  . CT ANGIO CHEST PE W OR WO CONTRAST    Standing Status:   Future    Standing Expiration Date:   06/14/2019    Order Specific Question:   If indicated for the ordered procedure, I authorize the administration of contrast media per Radiology protocol    Answer:   Yes    Order Specific Question:   Preferred imaging  location?    Answer:   [facility]    Order Specific Question:   Radiology Contrast Protocol - do NOT remove file path    Answer:   \\[email or web address]  . US Venous Img Lower Bilateral    Standing Status:   Future    Standing Expiration Date:   03/16/2019    Order Specific Question:   Reason for Exam (SYMPTOM  OR DIAGNOSIS REQUIRED)    Answer:   PE follow-up    Order Specific Question:   Preferred imaging location?    Answer:   [facility]  . Lupus anticoagulant panel    Standing Status:   Future    Number of Occurrences:   1    Standing Expiration Date:   03/17/2019  . Beta-2-glycoprotein i abs, IgG/M/A    Standing Status:   Future    Number of Occurrences:   1    Standing Expiration Date:   03/17/2019  . CBC with Differential    Standing Status:   Future    Number of Occurrences:   1    Standing Expiration Date:   03/17/2019  . Comprehensive metabolic panel    Standing Status:   Future    Number of Occurrences:   1    Standing Expiration Date:   03/17/2019  . Lactate dehydrogenase    Standing Status:   Future    Number of Occurrences:   1    Standing Expiration Date:   03/17/2019  . Factor 5 leiden    Standing Status:   Future    Number of Occurrences:   1    Standing Expiration Date:   03/17/2019  . Prothrombin gene mutation    Standing Status:   Future    Number of Occurrences:   1    Standing Expiration Date:   03/17/2019  . ANA, IFA (with reflex)    Standing Status:   Future    Number of Occurrences:   1    Standing Expiration Date:   03/17/2019  . Rheumatoid factor    Standing Status:   Future    Number of Occurrences:   1    Standing Expiration Date:   03/17/2019  . Sedimentation rate    Standing Status:   Future    Number of Occurrences:   1    Standing Expiration Date:   03/17/2019  . C-reactive protein    Standing Status:   Future    Number of Occurrences:   1    Standing Expiration Date:   03/17/2019  . Ferritin     Standing Status:   Future    Number of Occurrences:   1    Standing Expiration Date:   03/17/2019  . Protein electrophoresis, serum  Standing Status:   Future    Number of Occurrences:   1    Standing Expiration Date:   03/17/2019       Ahmed Prima MD

## 2018-03-17 LAB — LUPUS ANTICOAGULANT PANEL
DRVVT: 44.4 s (ref 0.0–47.0)
PTT Lupus Anticoagulant: 30.6 s (ref 0.0–51.9)

## 2018-03-17 LAB — BETA-2-GLYCOPROTEIN I ABS, IGG/M/A
Beta-2 Glyco I IgG: <9 GPI IgG units (ref 0–20)
Beta-2-Glycoprotein I IgA: <9 GPI IgA units (ref 0–25)
Beta-2-Glycoprotein I IgM: <9 GPI IgM units (ref 0–32)

## 2018-03-17 LAB — ANTINUCLEAR ANTIBODIES, IFA: ANA Ab, IFA: NEGATIVE

## 2018-03-17 LAB — RHEUMATOID FACTOR: Rheumatoid fact SerPl-aCnc: <10 IU/mL (ref 0.0–13.9)

## 2018-03-20 ENCOUNTER — Other Ambulatory Visit: Payer: Self-pay | Admitting: Surgery

## 2018-03-20 DIAGNOSIS — Z932 Ileostomy status: Secondary | ICD-10-CM

## 2018-03-20 LAB — PROTEIN ELECTROPHORESIS, SERUM
A/G RATIO SPE: 1.5 (ref 0.7–1.7)
ALBUMIN ELP: 3.7 g/dL (ref 2.9–4.4)
ALPHA-1-GLOBULIN: 0.2 g/dL (ref 0.0–0.4)
Alpha-2-Globulin: 0.6 g/dL (ref 0.4–1.0)
Beta Globulin: 1 g/dL (ref 0.7–1.3)
Gamma Globulin: 0.8 g/dL (ref 0.4–1.8)
Globulin, Total: 2.5 g/dL (ref 2.2–3.9)
Total Protein ELP: 6.2 g/dL (ref 6.0–8.5)

## 2018-03-21 DIAGNOSIS — R12 Heartburn: Secondary | ICD-10-CM | POA: Diagnosis not present

## 2018-03-21 LAB — PROTHROMBIN GENE MUTATION

## 2018-03-22 ENCOUNTER — Ambulatory Visit (HOSPITAL_COMMUNITY)
Admission: RE | Admit: 2018-03-22 | Discharge: 2018-03-22 | Disposition: A | Discharge status: Home / Self Care | Payer: BLUE CROSS/BLUE SHIELD | Source: Ambulatory Visit | Attending: Internal Medicine | Admitting: Internal Medicine

## 2018-03-22 DIAGNOSIS — R634 Abnormal weight loss: Secondary | ICD-10-CM

## 2018-03-22 DIAGNOSIS — K5909 Other constipation: Secondary | ICD-10-CM | POA: Insufficient documentation

## 2018-03-22 DIAGNOSIS — I2699 Other pulmonary embolism without acute cor pulmonale: Secondary | ICD-10-CM

## 2018-03-22 DIAGNOSIS — M25559 Pain in unspecified hip: Secondary | ICD-10-CM | POA: Diagnosis not present

## 2018-03-22 LAB — FACTOR 5 LEIDEN

## 2018-03-23 DIAGNOSIS — F3181 Bipolar II disorder: Secondary | ICD-10-CM | POA: Diagnosis not present

## 2018-03-29 ENCOUNTER — Ambulatory Visit
Admission: RE | Admit: 2018-03-29 | Discharge: 2018-03-29 | Disposition: A | Discharge status: Home / Self Care | Payer: BLUE CROSS/BLUE SHIELD | Source: Ambulatory Visit | Attending: Surgery | Admitting: Surgery

## 2018-03-29 DIAGNOSIS — Z432 Encounter for attention to ileostomy: Secondary | ICD-10-CM | POA: Diagnosis not present

## 2018-03-29 DIAGNOSIS — Z932 Ileostomy status: Secondary | ICD-10-CM

## 2018-03-29 DIAGNOSIS — Z01818 Encounter for other preprocedural examination: Secondary | ICD-10-CM | POA: Diagnosis not present

## 2018-03-29 DIAGNOSIS — Z933 Colostomy status: Secondary | ICD-10-CM | POA: Diagnosis not present

## 2018-03-29 DIAGNOSIS — K56609 Unspecified intestinal obstruction, unspecified as to partial versus complete obstruction: Secondary | ICD-10-CM | POA: Diagnosis not present

## 2018-04-05 ENCOUNTER — Ambulatory Visit (HOSPITAL_COMMUNITY)
Admission: RE | Admit: 2018-04-05 | Discharge: 2018-04-05 | Disposition: A | Discharge status: Home / Self Care | Payer: BLUE CROSS/BLUE SHIELD | Source: Ambulatory Visit | Attending: Internal Medicine | Admitting: Internal Medicine

## 2018-04-05 ENCOUNTER — Other Ambulatory Visit: Payer: Self-pay

## 2018-04-05 ENCOUNTER — Other Ambulatory Visit (HOSPITAL_COMMUNITY): Payer: Self-pay | Admitting: *Deleted

## 2018-04-05 ENCOUNTER — Ambulatory Visit (HOSPITAL_COMMUNITY): Payer: BLUE CROSS/BLUE SHIELD

## 2018-04-05 DIAGNOSIS — R634 Abnormal weight loss: Secondary | ICD-10-CM

## 2018-04-05 DIAGNOSIS — K5909 Other constipation: Secondary | ICD-10-CM | POA: Diagnosis not present

## 2018-04-05 DIAGNOSIS — I2699 Other pulmonary embolism without acute cor pulmonale: Secondary | ICD-10-CM

## 2018-04-05 DIAGNOSIS — M25559 Pain in unspecified hip: Secondary | ICD-10-CM | POA: Insufficient documentation

## 2018-04-05 MED ORDER — IOHEXOL 350 MG/ML SOLN
100.0000 mL | Freq: Once | INTRAVENOUS | Status: AC | PRN
  Administered 2018-04-05: 100 mL via INTRAVENOUS

## 2018-04-05 NOTE — Progress Notes (Signed)
Talked with Rosana Berger 04/05/2018 @ 1645, insurance denies CT Pelvis

## 2018-04-11 ENCOUNTER — Encounter (HOSPITAL_COMMUNITY): Payer: Self-pay | Admitting: Hematology

## 2018-04-11 ENCOUNTER — Other Ambulatory Visit: Payer: Self-pay

## 2018-04-11 ENCOUNTER — Inpatient Hospital Stay (HOSPITAL_COMMUNITY): Payer: BLUE CROSS/BLUE SHIELD | Attending: Hematology | Admitting: Hematology

## 2018-04-11 VITALS — BP 122/71 | HR 105 | Temp 97.5°F | Wt 207.1 lb

## 2018-04-11 DIAGNOSIS — I2699 Other pulmonary embolism without acute cor pulmonale: Secondary | ICD-10-CM

## 2018-04-11 DIAGNOSIS — Z7901 Long term (current) use of anticoagulants: Secondary | ICD-10-CM

## 2018-04-11 NOTE — Progress Notes (Signed)
[facility] [address] [place] [address]   CLINIC:  Medical Oncology/Hematology  PCP:  [name], MD [address] [place] [address] [phone]   REASON FOR VISIT:  Follow-up for Other acute pulmonary embolism without acute cor pulmonale      INTERVAL HISTORY:  Mr. Philip Thompson 28 y.o. male returns for routine follow-up. He is here today with his mother. He states that he is taking the Eliquis as directed. He states that he has bee feeling good lately. Denies any recent cough. Denies any nausea, vomiting, or diarrhea. Denies any new pains. Had not noticed any recent bleeding such as epistaxis, hematuria or hematochezia. Denies recent chest pain on exertion, shortness of breath on minimal exertion, pre-syncopal episodes, or palpitations. Denies any numbness or tingling in hands or feet. Denies any recent fevers, infections, or recent hospitalizations. Patient reports appetite at 100% and energy level at 100%.   REVIEW OF SYSTEMS:  Review of Systems  All other systems reviewed and are negative.    PAST MEDICAL/SURGICAL HISTORY:  Past Medical History:  Diagnosis Date  . ADD (attention deficit disorder)   . Aggression   . Allergic rhinitis   . Anxiety   . Arthritis    joint pain  . Cardiomyopathy (HCC)    a. EF <25% in 2015 during admission for multi substance intoxication/respiratory failure requiring intubation.  . Chronic back pain   . Constipation   . Depression   . Fecal obstruction (HCC)   . Frontal head injury   . Heroin overdose (HCC)   . Obstipation   . Polysubstance abuse (HCC)   . Suicide attempt [facility])    Past Surgical History:  Procedure Laterality Date  . COLECTOMY N/A 09/27/2017   Procedure: SUBTOTAL COLECTOMY AND ILEOSTOMY;  Surgeon: Abigail Miyamoto, MD;  Location: [facility] OR;  Service: General;  Laterality: N/A;  . FOOT SURGERY     to get a BB out  . IR GASTR TUBE CONVERT GASTR-JEJ PER W/FL MOD SED  10/19/2017   . IR GASTROSTOMY TUBE MOD SED  10/17/2017  . IR GASTROSTOMY TUBE REMOVAL  12/22/2017  . IR IVC FILTER PLMT / S&I /IMG GUID/MOD SED  10/02/2017  . NO PAST SURGERIES       SOCIAL HISTORY:  Social History   Socioeconomic History  . Marital status: Single    Spouse name: Not on file  . Number of children: Not on file  . Years of education: Not on file  . Highest education level: 12th grade  Occupational History  . Not on file  Social Needs  . Financial resource strain: Somewhat hard  . Food insecurity:    Worry: Never true    Inability: Never true  . Transportation needs:    Medical: No    Non-medical: No  Tobacco Use  . Smoking status: Current Every Day Smoker    Packs/day: 1.00    Years: 10.00    Pack years: 10.00    Types: Cigarettes  . Smokeless tobacco: Never Used  Substance and Sexual Activity  . Alcohol use: Not Currently    Comment: hx of. last use 01/2016  . Drug use: Yes    Types: Marijuana, Cocaine    Comment: last used marijuana 3 weeks ago. heroin last used  6 months ago.   Marland Kitchen Sexual activity: Yes    Birth control/protection: Condom  Lifestyle  . Physical activity:    Days per week: 0 days  Minutes per session: 0 min  . Stress: To some extent  Relationships  . Social connections:    Talks on phone: More than three times a week    Gets together: Twice a week    Attends religious service: Never    Active member of club or organization: No    Attends meetings of clubs or organizations: Never    Relationship status: Never married  . Intimate partner violence:    Fear of current or ex partner: No    Emotionally abused: No    Physically abused: No    Forced sexual activity: No  Other Topics Concern  . Not on file  Social History Narrative      Resides with parents     FAMILY HISTORY:  Family History  Problem Relation Age of Onset  . Dementia Father   . Alcohol abuse Father   . Mental illness Brother   . Diabetes Brother   . Cancer - Ovarian  Maternal Grandmother   . Cancer - Lung Paternal Grandmother   . Cancer Paternal Grandfather   . Diabetes Other   . Hypertension Other   . Heart disease Other   . Heart attack Other   . Fibromyalgia Mother   . Thyroid disease Mother   . Colitis Mother   . Spina bifida Sister   . Mental illness Sister   . Hepatitis C Sister   . Mental illness Brother     CURRENT MEDICATIONS:  Outpatient Encounter Medications as of 04/11/2018  Medication Sig  . apixaban (ELIQUIS) 5 MG TABS tablet Take 1 tablet (5 mg total) by mouth 2 (two) times daily.  . busPIRone (BUSPAR) 15 MG tablet Take 15 mg by mouth 2 (two) times daily.   . citalopram (CELEXA) 40 MG tablet Take 40 mg by mouth daily.   . clonazePAM (KLONOPIN) 1 MG tablet Place 1 tablet (1 mg total) into feeding tube every 8 (eight) hours. (Patient taking differently: Take 1 mg by mouth daily. )  . divalproex (DEPAKOTE) 500 MG DR tablet Take 500 mg by mouth 2 (two) times daily.   . famotidine (PEPCID) 20 MG tablet Take 20 mg by mouth daily.   . fluticasone (FLONASE) 50 MCG/ACT nasal spray Place 2 sprays into both nostrils daily.  Marland Kitchen levothyroxine (SYNTHROID, LEVOTHROID) 75 MCG tablet TAKE ONE TABLET EACH MORNING BEFORE BREAKFAST  . mupirocin ointment (BACTROBAN) 2 % Apply BID to base of Gtube.  . propranolol (INDERAL) 40 MG tablet Take 40 mg by mouth 2 (two) times daily.   . QUEtiapine (SEROQUEL) 400 MG tablet Take 1,200 mg by mouth daily. Per pt he takes 1,200 mg daily  . traMADol (ULTRAM) 50 MG tablet One tid prn pain use sparingly (Patient not taking: Reported on 04/11/2018)  . traZODone (DESYREL) 150 MG tablet Take 50 mg by mouth 2 (two) times daily.   Marland Kitchen triamcinolone cream (KENALOG) 0.1 % Apply 1 application topically 2 (two) times daily. (Patient not taking: Reported on 04/11/2018)  . [DISCONTINUED] doxycycline (VIBRA-TABS) 100 MG tablet Take 1 tablet (100 mg total) by mouth 2 (two) times daily.   No facility-administered encounter medications  on file as of 04/11/2018.     ALLERGIES:  No Known Allergies   PHYSICAL EXAM:  ECOG Performance status: 1  Vitals:   04/11/18 1315  BP: 122/71  Pulse: (!) 105  Temp: (!) 97.5 F (36.4 C)  SpO2: 98%   Filed Weights   04/11/18 1315  Weight: 207 lb 1.6 oz (  93.9 kg)    Physical Exam Cardiovascular:     Rate and Rhythm: Normal rate and regular rhythm.     Heart sounds: Normal heart sounds.  Pulmonary:     Effort: Pulmonary effort is normal.     Breath sounds: Normal breath sounds.  Abdominal:     Palpations: Abdomen is soft. There is no mass.  Musculoskeletal:        General: No swelling.     Right lower leg: Edema present.     Left lower leg: No edema.  Skin:    General: Skin is warm.  Neurological:     General: No focal deficit present.     Mental Status: He is alert and oriented to person, place, and time.  Psychiatric:        Mood and Affect: Mood normal.        Behavior: Behavior normal.      LABORATORY DATA:  I have reviewed the labs as listed.  CBC    Component Value Date/Time   WBC 5.5 03/16/2018 1418   RBC 4.81 03/16/2018 1418   HGB 12.9 (L) 03/16/2018 1418   HGB 12.5 (L) 03/08/2018 1102   HCT 41.6 03/16/2018 1418   HCT 38.6 03/08/2018 1102   PLT PLATELET CLUMPS NOTED ON SMEAR, UNABLE TO ESTIMATE 03/16/2018 1418   PLT 149 (L) 03/08/2018 1102   MCV 86.5 03/16/2018 1418   MCV 85 03/08/2018 1102   MCH 26.8 03/16/2018 1418   MCHC 31.0 03/16/2018 1418   RDW 14.7 03/16/2018 1418   RDW 14.4 03/08/2018 1102   LYMPHSABS 2.0 03/16/2018 1418   LYMPHSABS 1.9 03/08/2018 1102   MONOABS 0.6 03/16/2018 1418   EOSABS 0.3 03/16/2018 1418   EOSABS 0.2 03/08/2018 1102   BASOSABS 0.0 03/16/2018 1418   BASOSABS 0.0 03/08/2018 1102   CMP Latest Ref Rng & Units 03/16/2018 03/08/2018 10/27/2017  Glucose 70 - 99 mg/dL 741(O) 82 92  BUN 6 - 20 mg/dL 8 10 10   Creatinine 0.61 - 1.24 mg/dL 8.78 6.76 7.20(N)  Sodium 135 - 145 mmol/L 138 143 135  Potassium 3.5 - 5.1  mmol/L 4.4 4.6 4.2  Chloride 98 - 111 mmol/L 108 100 97(L)  CO2 22 - 32 mmol/L 24 28 30   Calcium 8.9 - 10.3 mg/dL 9.0 9.4 9.4  Total Protein 6.5 - 8.1 g/dL 6.8 - -  Total Bilirubin 0.3 - 1.2 mg/dL 4.7(S) - -  Alkaline Phos 38 - 126 U/L 71 - -  AST 15 - 41 U/L 15 - -  ALT 0 - 44 U/L 14 - -       DIAGNOSTIC IMAGING:  I have independently reviewed the scans and discussed with the patient.   I have reviewed Willy Eddy LPN's note and agree with the documentation.  I personally performed a face-to-face visit, made revisions and my assessment and plan is as follows.    ASSESSMENT & PLAN:   Acute pulmonary embolism without acute cor pulmonale (HCC) 1.  Unprovoked pulmonary embolism: -CT chest PE protocol on 09/22/2017 shows bilateral pulmonary emboli with no evidence of right ventricular strain.  Diffuse emphysematous changes in the lungs.  Massive distention of the colon. -Subtotal colectomy and ileostomy on 09/27/2017 due to colonic inertia from chronic opioid abuse. - Patient on Eliquis 5 mg twice daily and is tolerating it very well. - We reviewed results of the CT PE protocol on 04/05/2018 which showed interval resolution of the previously demonstrated pulmonary emboli.  Interval mild patchy opacities  in the right middle lobe and left lower lobe compatible with inflammation.  Stable changes of COPD and chronic bronchitis.  Stable mild right hilar adenopathy likely reactive. -Ultrasound Doppler of the lower extremities on 03/22/2018 shows no femoropopliteal and no calf DVT. - As he had unprovoked pulmonary embolism, I have recommended continuing full dose anticoagulation for at least 6 more months followed by decreased intensity anticoagulation with Eliquis 2.5 mg twice daily.  He and his mother are agreeable to this recommendation. -He is planning to have reversal of ileostomy by Dr. Magnus Ivan in the near future.  He will require bridging of anticoagulation with Lovenox around the time of  surgery. -I will see him back in 6 months for follow-up.  Total time spent is 25 minutes with more than 50% of the time spent face-to-face discussing rationale for continuation of anticoagulation and coordination of care.    Orders placed this encounter:  Orders Placed This Encounter  Procedures  . CBC with Differential/Platelet  . Comprehensive metabolic panel  . D-dimer, quantitative      Doreatha Massed, MD [facility] [phone]

## 2018-04-11 NOTE — Assessment & Plan Note (Signed)
1.  Unprovoked pulmonary embolism: -CT chest PE protocol on 09/22/2017 shows bilateral pulmonary emboli with no evidence of right ventricular strain.  Diffuse emphysematous changes in the lungs.  Massive distention of the colon. -Subtotal colectomy and ileostomy on 09/27/2017 due to colonic inertia from chronic opioid abuse. - Patient on Eliquis 5 mg twice daily and is tolerating it very well. - We reviewed results of the CT PE protocol on 04/05/2018 which showed interval resolution of the previously demonstrated pulmonary emboli.  Interval mild patchy opacities in the right middle lobe and left lower lobe compatible with inflammation.  Stable changes of COPD and chronic bronchitis.  Stable mild right hilar adenopathy likely reactive. -Ultrasound Doppler of the lower extremities on 03/22/2018 shows no femoropopliteal and no calf DVT. - As he had unprovoked pulmonary embolism, I have recommended continuing full dose anticoagulation for at least 6 more months followed by decreased intensity anticoagulation with Eliquis 2.5 mg twice daily.  He and his mother are agreeable to this recommendation. -He is planning to have reversal of ileostomy by Dr. Magnus Ivan in the near future.  He will require bridging of anticoagulation with Lovenox around the time of surgery. -I will see him back in 6 months for follow-up.

## 2018-04-11 NOTE — Patient Instructions (Addendum)
[facility] Discharge Instructions  You were seen today by Dr. Ellin Saba. He went over your recent scan and ultra sound results, they both were good. You have had an unprovoked blood clot, due to this and your age you will need to continue taking the blood thinner until it no longer helps. Please let us know if you get scheduled for surgery. He will see you back in 6 months for labs and follow up.   Thank you for choosing  Cancer Center at [facility] to provide your oncology and hematology care.  To afford each patient quality time with our provider, please arrive at least 15 minutes before your scheduled appointment time.   If you have a lab appointment with the Cancer Center please come in thru the  Main Entrance and check in at the main information desk  You need to re-schedule your appointment should you arrive 10 or more minutes late.  We strive to give you quality time with our providers, and arriving late affects you and other patients whose appointments are after yours.  Also, if you no show three or more times for appointments you may be dismissed from the clinic at the providers discretion.     Again, thank you for choosing [facility].  Our hope is that these requests will decrease the amount of time that you wait before being seen by our physicians.       _____________________________________________________________  Should you have questions after your visit to [facility], please contact our office at [phone] between the hours of 8:00 a.m. and 4:30 p.m.  Voicemails left after 4:00 p.m. will not be returned until the following business day.  For prescription refill requests, have your pharmacy contact our office and allow 72 hours.    Cancer Center Support Programs:   > Cancer Support Group  2nd Tuesday of the month 1pm-2pm, Journey Room

## 2018-04-16 ENCOUNTER — Encounter: Payer: Self-pay | Admitting: Family Medicine

## 2018-04-28 DIAGNOSIS — Z933 Colostomy status: Secondary | ICD-10-CM | POA: Diagnosis not present

## 2018-04-28 DIAGNOSIS — K56609 Unspecified intestinal obstruction, unspecified as to partial versus complete obstruction: Secondary | ICD-10-CM | POA: Diagnosis not present

## 2018-05-02 ENCOUNTER — Other Ambulatory Visit: Payer: Self-pay | Admitting: Family Medicine

## 2018-05-02 NOTE — Telephone Encounter (Signed)
Med list states pt not taking. Left message to return call

## 2018-05-04 ENCOUNTER — Other Ambulatory Visit: Payer: Self-pay | Admitting: Family Medicine

## 2018-05-04 MED ORDER — TRAMADOL HCL 50 MG PO TABS: ORAL_TABLET | ORAL | 0 refills | Status: DC

## 2018-05-04 NOTE — Telephone Encounter (Signed)
I sent in a prescription may delete this 1

## 2018-05-04 NOTE — Telephone Encounter (Signed)
Mom returned call. Mom states that patient back is bothering him. Pt mom states he is taking the Tramadol. Please advise. Thank you

## 2018-05-11 ENCOUNTER — Encounter
Payer: BLUE CROSS/BLUE SHIELD | Attending: Physical Medicine & Rehabilitation | Admitting: Physical Medicine & Rehabilitation

## 2018-05-11 ENCOUNTER — Encounter: Payer: Self-pay | Admitting: Physical Medicine & Rehabilitation

## 2018-05-11 ENCOUNTER — Other Ambulatory Visit: Payer: Self-pay

## 2018-05-11 VITALS — BP 120/86 | HR 133 | Temp 97.9°F | Ht 68.0 in | Wt 210.0 lb

## 2018-05-11 DIAGNOSIS — S069X2A Unspecified intracranial injury with loss of consciousness of 31 minutes to 59 minutes, initial encounter: Secondary | ICD-10-CM | POA: Diagnosis not present

## 2018-05-11 DIAGNOSIS — Z720 Tobacco use: Secondary | ICD-10-CM

## 2018-05-11 DIAGNOSIS — F0630 Mood disorder due to known physiological condition, unspecified: Secondary | ICD-10-CM

## 2018-05-11 DIAGNOSIS — F333 Major depressive disorder, recurrent, severe with psychotic symptoms: Secondary | ICD-10-CM

## 2018-05-11 DIAGNOSIS — G8921 Chronic pain due to trauma: Secondary | ICD-10-CM

## 2018-05-11 DIAGNOSIS — R000 Tachycardia, unspecified: Secondary | ICD-10-CM

## 2018-05-11 NOTE — Progress Notes (Signed)
Subjective:    Patient ID: Philip Thompson, male    DOB: Feb 24, 1990, 28 y.o.   MRN: [id number]  TELEHEALTH NOTE  Due to national recommendations of social distancing due to COVID 19, an audio/video telehealth visit is felt to be most appropriate for this patient at this time.  See Chart message from today for the patient's consent to telehealth from [facility].     I verified that I am speaking with the correct person using two identifiers.  Location of patient: Home Location of provider: Office Method of communication: Telephone Names of participants : Wadie Lessen scheduling, Angelica Chessman obtaining consent and vitals if available Established patient Time spent on call: 15 minutes  HPI 28 year old right-handed male with history of multiple TBIs, anoxic brain injury, chronic pain, polysubstance abuse, chronic constipation, presents for follow-up for debility.  Last clinic visit on 11/09/2017.  Limited historian. Mother supplements history. Since that time, he states he has not been doing well due to anxiety and not being able to sleep. He is doing limited HEP.  He is eating a regular diet now with G-J tube d/ced. He is taking tylenol PRN for pain. He is following up with Psychiatry, who is making medication adjustments.  He is smoking 1/2 PPD.  Pain Inventory Average Pain 7 Pain Right Now 9 My pain is burning and stabbing  In the last 24 hours, has pain interfered with the following? General activity 8 Relation with others 8 Enjoyment of life 10 What TIME of day is your pain at its worst? all, evening, night Sleep (in general) Poor  Pain is worse with: bending Pain improves with: medication Relief from Meds: 3  Mobility walk without assistance how many minutes can you walk? 10-15 ability to climb steps?  yes do you drive?  no transfers alone  Function not employed: date last employed . I need assistance with the following:  bathing,  household duties and shopping Do you have any goals in this area?  yes  Neuro/Psych weakness numbness confusion depression anxiety  Prior Studies x-rays CT/MRI  Physicians involved in your care Psychologist Dr. Rene Kocher Hematologist   Family History  Problem Relation Age of Onset  . Dementia Father   . Alcohol abuse Father   . Mental illness Brother   . Diabetes Brother   . Cancer - Ovarian Maternal Grandmother   . Cancer - Lung Paternal Grandmother   . Cancer Paternal Grandfather   . Diabetes Other   . Hypertension Other   . Heart disease Other   . Heart attack Other   . Fibromyalgia Mother   . Thyroid disease Mother   . Colitis Mother   . Spina bifida Sister   . Mental illness Sister   . Hepatitis C Sister   . Mental illness Brother    Social History   Socioeconomic History  . Marital status: Single    Spouse name: Not on file  . Number of children: Not on file  . Years of education: Not on file  . Highest education level: 12th grade  Occupational History  . Not on file  Social Needs  . Financial resource strain: Somewhat hard  . Food insecurity:    Worry: Never true    Inability: Never true  . Transportation needs:    Medical: No    Non-medical: No  Tobacco Use  . Smoking status: Current Every Day Smoker    Packs/day: 1.00    Years: 10.00  Pack years: 10.00    Types: Cigarettes  . Smokeless tobacco: Never Used  Substance and Sexual Activity  . Alcohol use: Not Currently    Comment: hx of. last use 01/2016  . Drug use: Yes    Types: Marijuana, Cocaine    Comment: last used marijuana 3 weeks ago. heroin last used  6 months ago.   Marland Kitchen Sexual activity: Yes    Birth control/protection: Condom  Lifestyle  . Physical activity:    Days per week: 0 days    Minutes per session: 0 min  . Stress: To some extent  Relationships  . Social connections:    Talks on phone: More than three times a week    Gets together: Twice a week    Attends religious  service: Never    Active member of club or organization: No    Attends meetings of clubs or organizations: Never    Relationship status: Never married  Other Topics Concern  . Not on file  Social History Narrative      Resides with parents    Past Surgical History:  Procedure Laterality Date  . COLECTOMY N/A 09/27/2017   Procedure: SUBTOTAL COLECTOMY AND ILEOSTOMY;  Surgeon: Abigail Miyamoto, MD;  Location: [facility] OR;  Service: General;  Laterality: N/A;  . FOOT SURGERY     to get a BB out  . IR GASTR TUBE CONVERT GASTR-JEJ PER W/FL MOD SED  10/19/2017  . IR GASTROSTOMY TUBE MOD SED  10/17/2017  . IR GASTROSTOMY TUBE REMOVAL  12/22/2017  . IR IVC FILTER PLMT / S&I /IMG GUID/MOD SED  10/02/2017  . NO PAST SURGERIES     Past Medical History:  Diagnosis Date  . ADD (attention deficit disorder)   . Aggression   . Allergic rhinitis   . Anxiety   . Arthritis    joint pain  . Cardiomyopathy (HCC)    a. EF <25% in 2015 during admission for multi substance intoxication/respiratory failure requiring intubation.  . Chronic back pain   . Constipation   . Depression   . Fecal obstruction (HCC)   . Frontal head injury   . Heroin overdose (HCC)   . Obstipation   . Polysubstance abuse (HCC)   . Suicide attempt (HCC)    BP 120/86 Comment: pt reported, virtual visit  Pulse (!) 133 Comment: pt reported, virtual visit  Temp 97.9 F (36.6 C) Comment: pt reported, virtual visit  Ht 5\' 8"  (1.727 m) Comment: pt reported, virtual visit  Wt 210 lb (95.3 kg) Comment: pt reported, virtual visit  BMI 31.93 kg/m   Opioid Risk Score:   Fall Risk Score:  `1  Depression screen PHQ 2/9  Depression screen [facility] 2/9 05/11/2018 11/09/2017 01/21/2017 08/20/2013  Decreased Interest 1 1 1  0  Down, Depressed, Hopeless 1 0 0 0  PHQ - 2 Score 2 1 1  0  Altered sleeping - 0 - -  Tired, decreased energy - 2 - -  Change in appetite - 0 - -  Feeling bad or failure about yourself  - 0 - -  Trouble concentrating - 0  - -  Moving slowly or fidgety/restless - 0 - -  Suicidal thoughts - 0 - -  PHQ-9 Score - 3 - -  Difficult doing work/chores - Not difficult at all - -    Review of Systems  Constitutional: Negative.   HENT: Negative.   Eyes: Negative.   Respiratory: Negative.  Negative for cough and wheezing.  Cardiovascular: Negative.   Gastrointestinal: Positive for abdominal pain.  Endocrine: Negative.   Genitourinary: Negative.  Negative for difficulty urinating, dysuria and flank pain.  Musculoskeletal: Positive for back pain and neck pain.       Leg pain  Skin: Negative.   Allergic/Immunologic: Negative.   Neurological: Positive for weakness. Negative for numbness.  Hematological: Negative.   Psychiatric/Behavioral: Positive for confusion and dysphoric mood. The patient is nervous/anxious.   All other systems reviewed and are negative.     Objective:   Physical Exam Gen: NAD. Pulm: Effort normal Neuro: Alert and oriented    Assessment & Plan:  28 year old right-handed male with history of multiple TBIs, anoxic brain injury, chronic pain, polysubstance abuse, chronic constipation, presents for follow-up for debility.  1. 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 Chronic pain syndrome  Relatively controlled on OTC meds  4. Mood:   Cont follow up with Psychiatry - meds being adjusted mainly for mood stability  5. Tobacco and polysubstance abuse.   Now smoking 1/2 PPD  Encouraged follow up with PCP  Encouraged abstinence again  6. Tachycardia  Elevated today - patient states elevated due to stress  of appointment  Encouraged Propranolol 40 TID

## 2018-05-12 DIAGNOSIS — Z933 Colostomy status: Secondary | ICD-10-CM | POA: Diagnosis not present

## 2018-05-12 DIAGNOSIS — K56609 Unspecified intestinal obstruction, unspecified as to partial versus complete obstruction: Secondary | ICD-10-CM | POA: Diagnosis not present

## 2018-05-16 DIAGNOSIS — K56609 Unspecified intestinal obstruction, unspecified as to partial versus complete obstruction: Secondary | ICD-10-CM | POA: Diagnosis not present

## 2018-05-16 DIAGNOSIS — Z933 Colostomy status: Secondary | ICD-10-CM | POA: Diagnosis not present

## 2018-06-03 ENCOUNTER — Emergency Department (HOSPITAL_COMMUNITY)
Admission: EM | Admit: 2018-06-03 | Discharge: 2018-06-03 | Disposition: A | Discharge status: Home / Self Care | Payer: BLUE CROSS/BLUE SHIELD | Attending: Emergency Medicine | Admitting: Emergency Medicine

## 2018-06-03 ENCOUNTER — Emergency Department (HOSPITAL_COMMUNITY)
Admission: EM | Admit: 2018-06-03 | Discharge: 2018-06-03 | Disposition: A | Discharge status: Home / Self Care | Payer: BLUE CROSS/BLUE SHIELD | Source: Home / Self Care | Attending: Emergency Medicine | Admitting: Emergency Medicine

## 2018-06-03 ENCOUNTER — Encounter (HOSPITAL_COMMUNITY): Payer: Self-pay | Admitting: Emergency Medicine

## 2018-06-03 ENCOUNTER — Other Ambulatory Visit: Payer: Self-pay

## 2018-06-03 ENCOUNTER — Encounter (HOSPITAL_COMMUNITY): Payer: Self-pay | Admitting: *Deleted

## 2018-06-03 DIAGNOSIS — L03311 Cellulitis of abdominal wall: Secondary | ICD-10-CM | POA: Diagnosis not present

## 2018-06-03 DIAGNOSIS — B372 Candidiasis of skin and nail: Secondary | ICD-10-CM

## 2018-06-03 DIAGNOSIS — Z432 Encounter for attention to ileostomy: Secondary | ICD-10-CM | POA: Insufficient documentation

## 2018-06-03 DIAGNOSIS — R197 Diarrhea, unspecified: Secondary | ICD-10-CM | POA: Diagnosis not present

## 2018-06-03 DIAGNOSIS — Z932 Ileostomy status: Secondary | ICD-10-CM | POA: Diagnosis not present

## 2018-06-03 DIAGNOSIS — F17210 Nicotine dependence, cigarettes, uncomplicated: Secondary | ICD-10-CM | POA: Insufficient documentation

## 2018-06-03 DIAGNOSIS — Z79899 Other long term (current) drug therapy: Secondary | ICD-10-CM | POA: Insufficient documentation

## 2018-06-03 DIAGNOSIS — Z7901 Long term (current) use of anticoagulants: Secondary | ICD-10-CM | POA: Insufficient documentation

## 2018-06-03 DIAGNOSIS — E039 Hypothyroidism, unspecified: Secondary | ICD-10-CM | POA: Insufficient documentation

## 2018-06-03 DIAGNOSIS — L988 Other specified disorders of the skin and subcutaneous tissue: Secondary | ICD-10-CM | POA: Diagnosis not present

## 2018-06-03 MED ORDER — FLUCONAZOLE 150 MG PO TABS: 150.0000 mg | ORAL_TABLET | Freq: Every day | ORAL | 0 refills | Status: AC

## 2018-06-03 MED ORDER — FLUCONAZOLE 150 MG PO TABS: 150.0000 mg | ORAL_TABLET | Freq: Every day | ORAL | 0 refills | Status: DC

## 2018-06-03 MED ORDER — KETOROLAC TROMETHAMINE 60 MG/2ML IM SOLN
60.0000 mg | Freq: Once | INTRAMUSCULAR | Status: AC
  Administered 2018-06-03: 60 mg via INTRAMUSCULAR
  Filled 2018-06-03: qty 2

## 2018-06-03 MED ORDER — FLUCONAZOLE 150 MG PO TABS
150.0000 mg | ORAL_TABLET | Freq: Once | ORAL | Status: AC
  Administered 2018-06-03: 150 mg via ORAL
  Filled 2018-06-03: qty 1

## 2018-06-03 MED ORDER — CEPHALEXIN 500 MG PO CAPS: 500.0000 mg | ORAL_CAPSULE | Freq: Three times a day (TID) | ORAL | 0 refills | Status: AC

## 2018-06-03 MED ORDER — TRAMADOL HCL 50 MG PO TABS
50.0000 mg | ORAL_TABLET | Freq: Once | ORAL | Status: AC
  Administered 2018-06-03: 19:00:00 50 mg via ORAL
  Filled 2018-06-03: qty 1

## 2018-06-03 NOTE — ED Triage Notes (Signed)
Pt from home with leaking ileostomy x3  Days with it becoming progressively worst. Pt reports pain around site.

## 2018-06-03 NOTE — Discharge Instructions (Addendum)
I have discussed your care with the general surgeon today, she has recommended that you be on oral Diflucan which I have prescribed.  Please take this 1 pill a day for the next 2 weeks.  You will need to see your general surgeon in follow-up.  You may also need to see a wound ostomy nurse to help with some of the ostomy care.  It is also been recommended that you may need what is called an ostomy paste which goes on the skin and hardens and allowing the bag to attached to the paste instead of your skin however your pharmacy is closed and this is not a prescription that can be called in.  Your general surgeon may need to use this if the oral medication is not working. Urgency department for severe or worsening symptoms.

## 2018-06-03 NOTE — ED Triage Notes (Signed)
Pt with pain and redness around ostomy site, had since October 2019.
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
## 2018-06-03 NOTE — Discharge Instructions (Addendum)
Please use the barrier cream prescribed earlier in addition to anti-yeast and antibiotic rx.  Home health has been contacted. Please follow up with your PCP, general surgery and home health as previously discussed.  Take Tylenol 1000 mg 4 times a day for 1 week. This is the maximum dose of Tylenol (acetaminophen) you can take from all sources. Please check other over-the-counter medications and prescriptions to ensure you are not taking other medications that contain acetaminophen.

## 2018-06-05 ENCOUNTER — Other Ambulatory Visit: Payer: Self-pay | Admitting: Family Medicine
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
## 2018-06-08 DIAGNOSIS — K56609 Unspecified intestinal obstruction, unspecified as to partial versus complete obstruction: Secondary | ICD-10-CM | POA: Diagnosis not present

## 2018-06-08 DIAGNOSIS — Z933 Colostomy status: Secondary | ICD-10-CM | POA: Diagnosis not present

## 2018-06-15 DIAGNOSIS — K5909 Other constipation: Secondary | ICD-10-CM | POA: Diagnosis not present

## 2018-06-21 ENCOUNTER — Other Ambulatory Visit: Payer: Self-pay | Admitting: Family Medicine

## 2018-06-22 DIAGNOSIS — F3181 Bipolar II disorder: Secondary | ICD-10-CM | POA: Diagnosis not present

## 2018-06-26 ENCOUNTER — Other Ambulatory Visit: Payer: Self-pay | Admitting: *Deleted

## 2018-06-26 ENCOUNTER — Telehealth: Payer: Self-pay | Admitting: Family Medicine

## 2018-06-26 MED ORDER — TRAMADOL HCL 50 MG PO TABS: ORAL_TABLET | ORAL | 0 refills | Status: DC

## 2018-06-26 NOTE — Telephone Encounter (Signed)
Ok times one 

## 2018-06-26 NOTE — Telephone Encounter (Signed)
Med sent to pharm. Left message to return call  

## 2018-06-26 NOTE — Telephone Encounter (Signed)
Pt would like refill on traMADol (ULTRAM) 50 MG tablet. Please send to [place], [place] has follow up on 6/17

## 2018-06-26 NOTE — Telephone Encounter (Signed)
Please advise. Thank you

## 2018-06-27 NOTE — Telephone Encounter (Signed)
Pt returned call. I told pt medication has been sent to pharmacy

## 2018-06-28 ENCOUNTER — Other Ambulatory Visit: Payer: Self-pay | Admitting: Family Medicine

## 2018-06-30 NOTE — Telephone Encounter (Signed)
He was given a small prescription this week I recommend we will discuss all of this further at his virtual follow-up visit

## 2018-07-05 ENCOUNTER — Other Ambulatory Visit: Payer: Self-pay

## 2018-07-05 ENCOUNTER — Ambulatory Visit (INDEPENDENT_AMBULATORY_CARE_PROVIDER_SITE_OTHER): Payer: BC Managed Care – PPO | Admitting: Family Medicine

## 2018-07-05 DIAGNOSIS — E039 Hypothyroidism, unspecified: Secondary | ICD-10-CM | POA: Diagnosis not present

## 2018-07-05 DIAGNOSIS — I2699 Other pulmonary embolism without acute cor pulmonale: Secondary | ICD-10-CM

## 2018-07-05 DIAGNOSIS — G894 Chronic pain syndrome: Secondary | ICD-10-CM | POA: Diagnosis not present

## 2018-07-05 MED ORDER — TRAMADOL HCL 50 MG PO TABS: ORAL_TABLET | ORAL | 0 refills | Status: DC

## 2018-07-05 MED ORDER — APIXABAN 5 MG PO TABS: 5.0000 mg | ORAL_TABLET | Freq: Two times a day (BID) | ORAL | 3 refills | Status: AC

## 2018-07-05 MED ORDER — LEVOTHYROXINE SODIUM 75 MCG PO TABS: ORAL_TABLET | ORAL | 5 refills | Status: DC

## 2018-07-05 NOTE — Progress Notes (Signed)
   Subjective:    Patient ID: Philip Thompson, male    DOB: [birth year], 28 y.o.   MRN: [id number]  HPI Pt here for 4 month follow up. Pt states he has not been doing to well. Pt states his stomach is still giving him issue. Pt does not have G tube any longer.  Patient with intermittent chronic abdominal pain and discomfort Not appropriate to be on strong narcotics Does use tramadol sparingly Is seeing a psychiatrist on a regular basis making good progress Hopefully having reversal of his stoma later this summer Virtual Visit via Video Note  I connected with [place] on 07/05/18 at  3:00 PM EDT by a video enabled telemedicine application and verified that I am speaking with the correct person using two identifiers.  Location: Patient: home Provider: office   I discussed the limitations of evaluation and management by telemedicine and the availability of in person appointments. The patient expressed understanding and agreed to proceed.  History of Present Illness:    Observations/Objective:   Assessment and Plan:   Follow Up Instructions:    I discussed the assessment and treatment plan with the patient. The patient was provided an opportunity to ask questions and all were answered. The patient agreed with the plan and demonstrated an understanding of the instructions.   The patient was advised to call back or seek an in-person evaluation if the symptoms worsen or if the condition fails to improve as anticipated.  I provided 15 minutes of non-face-to-face time during this encounter.   Vicente Males, LPN    Review of Systems  Constitutional: Negative for diaphoresis and fatigue.  HENT: Negative for congestion and rhinorrhea.   Respiratory: Negative for cough and shortness of breath.   Cardiovascular: Negative for chest pain and leg swelling.  Gastrointestinal: Negative for abdominal pain and diarrhea.  Skin: Negative for color change and rash.  Neurological:  Negative for dizziness and headaches.  Psychiatric/Behavioral: Negative for behavioral problems and confusion.       Objective:   Physical Exam Today's visit was via telephone Physical exam was not possible for this visit        Assessment & Plan:  Chronic abdominal pain Tramadol refills given  History of hypothyroidism will need to check TSH later this year  Pulmonary embolism referral to hematology they will be seeing him in the summer Refills of Eliquis given to the patient Not having any bleeding issues

## 2018-07-07 DIAGNOSIS — K56609 Unspecified intestinal obstruction, unspecified as to partial versus complete obstruction: Secondary | ICD-10-CM | POA: Diagnosis not present

## 2018-07-07 DIAGNOSIS — Z933 Colostomy status: Secondary | ICD-10-CM | POA: Diagnosis not present

## 2018-07-20 DIAGNOSIS — K56609 Unspecified intestinal obstruction, unspecified as to partial versus complete obstruction: Secondary | ICD-10-CM | POA: Diagnosis not present

## 2018-07-20 DIAGNOSIS — Z933 Colostomy status: Secondary | ICD-10-CM | POA: Diagnosis not present

## 2018-07-31 DIAGNOSIS — R5381 Other malaise: Secondary | ICD-10-CM | POA: Diagnosis not present

## 2018-07-31 DIAGNOSIS — T50904A Poisoning by unspecified drugs, medicaments and biological substances, undetermined, initial encounter: Secondary | ICD-10-CM | POA: Diagnosis not present

## 2018-07-31 DIAGNOSIS — F17200 Nicotine dependence, unspecified, uncomplicated: Secondary | ICD-10-CM | POA: Diagnosis not present

## 2018-07-31 DIAGNOSIS — T40601A Poisoning by unspecified narcotics, accidental (unintentional), initial encounter: Secondary | ICD-10-CM | POA: Diagnosis not present

## 2018-07-31 DIAGNOSIS — Z0389 Encounter for observation for other suspected diseases and conditions ruled out: Secondary | ICD-10-CM | POA: Diagnosis not present

## 2018-07-31 DIAGNOSIS — T40604A Poisoning by unspecified narcotics, undetermined, initial encounter: Secondary | ICD-10-CM | POA: Diagnosis not present

## 2018-08-02 DIAGNOSIS — K56609 Unspecified intestinal obstruction, unspecified as to partial versus complete obstruction: Secondary | ICD-10-CM | POA: Diagnosis not present

## 2018-08-02 DIAGNOSIS — Z933 Colostomy status: Secondary | ICD-10-CM | POA: Diagnosis not present

## 2018-08-03 ENCOUNTER — Telehealth: Payer: Self-pay | Admitting: Family Medicine

## 2018-08-03 NOTE — Telephone Encounter (Signed)
Nurses I recommend that they contact hematology regarding this question The patient is under the care of hematology for this issue He may or may not need Lovenox bridging Please have the dental office call Dr. Delton Coombes office (please provide them with the number) A copy of this message is also being forwarded to Dr. Delton Coombes

## 2018-08-03 NOTE — Telephone Encounter (Signed)
Mateo Flow at Dr. Lowella Grip office called because they need something faxed or emailed from our office stating if patient should go off his blood thinners before dental surgery next Wednesday. Said that patient will have IV sedation for multiple extractions. She said they will need it by Monday or Tuesday if he needs to stop meds prior to surgery on Wednesday.   Need it faxed to [phone] attn: Mateo Flow  or emailed to [email or web address]  Their phone number is [phone]

## 2018-08-04 NOTE — Telephone Encounter (Signed)
Spoke with assistant at dental office and advised Dr Nicki Reaper recommends that they contact hematology regarding this question The patient is under the care of hematology for this issue He may or may not need Lovenox bridging Please have the dental office call Dr. Delton Coombes office Dental office verbalized understanding and will contact Dr Tomie China office

## 2018-08-07 ENCOUNTER — Other Ambulatory Visit (HOSPITAL_COMMUNITY): Payer: Self-pay | Admitting: Nurse Practitioner

## 2018-08-08 ENCOUNTER — Other Ambulatory Visit (HOSPITAL_COMMUNITY): Payer: Self-pay | Admitting: Nurse Practitioner

## 2018-08-08 ENCOUNTER — Telehealth (HOSPITAL_COMMUNITY): Payer: Self-pay | Admitting: *Deleted

## 2018-08-08 ENCOUNTER — Other Ambulatory Visit (HOSPITAL_COMMUNITY): Admission: RE | Admit: 2018-08-08 | Payer: BLUE CROSS/BLUE SHIELD | Source: Ambulatory Visit

## 2018-08-08 DIAGNOSIS — K56609 Unspecified intestinal obstruction, unspecified as to partial versus complete obstruction: Secondary | ICD-10-CM | POA: Diagnosis not present

## 2018-08-08 DIAGNOSIS — Z933 Colostomy status: Secondary | ICD-10-CM | POA: Diagnosis not present

## 2018-08-08 NOTE — Progress Notes (Signed)
Called patient and talked with patient as patient has not had covid test prior to [place]. [name] mom stated she had not been told that he had to have a covid by the office. Pt 20 mom stated he already has an appointment for oral surgery tomorrow and that can not be changed. [name] mom stated she would call DR. Marcello Moores' office to be rescheduled.

## 2018-08-08 NOTE — Telephone Encounter (Signed)
Erin from [facility] called and stated this patient is having two tooth extracted with IV sedation and wanted to know the protocol of when to stop and start the patient's medication eliquis. Spoke with provider about patient and provider stated for the patient to stop the day before and start back the day after the procedure. If the patient has SOB or increased bleeding the patient is to go to the ER immediately. Called back and spoke with Caryl Pina and told her what provider stated and she verbalized understanding and stated the clinic to fax it in writing as well. Faxed sent and received fax confirmation that fax was sent successfully.

## 2018-08-09 ENCOUNTER — Other Ambulatory Visit (HOSPITAL_COMMUNITY)
Admission: RE | Admit: 2018-08-09 | Discharge: 2018-08-09 | Disposition: A | Discharge status: Home / Self Care | Payer: BC Managed Care – PPO | Source: Ambulatory Visit | Attending: General Surgery | Admitting: General Surgery

## 2018-08-09 DIAGNOSIS — Z1159 Encounter for screening for other viral diseases: Secondary | ICD-10-CM | POA: Diagnosis not present

## 2018-08-09 LAB — SARS CORONAVIRUS 2 (TAT 6-24 HRS): SARS Coronavirus 2: NEGATIVE

## 2018-08-11 ENCOUNTER — Encounter (HOSPITAL_COMMUNITY)
Admission: RE | Disposition: A | Discharge status: Home / Self Care | Payer: Self-pay | Source: Home / Self Care | Attending: General Surgery

## 2018-08-11 ENCOUNTER — Ambulatory Visit (HOSPITAL_COMMUNITY)
Admission: RE | Admit: 2018-08-11 | Discharge: 2018-08-11 | Disposition: A | Discharge status: Home / Self Care | Payer: BC Managed Care – PPO | Attending: General Surgery | Admitting: General Surgery

## 2018-08-11 DIAGNOSIS — R933 Abnormal findings on diagnostic imaging of other parts of digestive tract: Secondary | ICD-10-CM | POA: Insufficient documentation

## 2018-08-11 DIAGNOSIS — K5900 Constipation, unspecified: Secondary | ICD-10-CM | POA: Insufficient documentation

## 2018-08-11 HISTORY — PX: ANAL RECTAL MANOMETRY: SHX6358

## 2018-08-11 SURGERY — MANOMETRY, ANORECTAL

## 2018-08-11 NOTE — Progress Notes (Signed)
2D anal manometry done per protocol.  Patient tolerated well without pain.  Small amount of blood on probe upon removal.  30cc balloon explusion test done.  Unable to tolerate more fluid in balloon. Patient unable to expel balloon within 3 minutes.  Balloon removed.  Dr. Marcello Moores notified.

## 2018-08-13 ENCOUNTER — Encounter (HOSPITAL_COMMUNITY): Payer: Self-pay | Admitting: General Surgery

## 2018-08-16 ENCOUNTER — Ambulatory Visit: Payer: Self-pay | Admitting: General Surgery

## 2018-08-29 DIAGNOSIS — K56609 Unspecified intestinal obstruction, unspecified as to partial versus complete obstruction: Secondary | ICD-10-CM | POA: Diagnosis not present

## 2018-08-29 DIAGNOSIS — Z933 Colostomy status: Secondary | ICD-10-CM | POA: Diagnosis not present

## 2018-08-31 ENCOUNTER — Other Ambulatory Visit: Payer: Self-pay | Admitting: Family Medicine

## 2018-09-01 ENCOUNTER — Telehealth: Payer: Self-pay | Admitting: Family Medicine

## 2018-09-01 NOTE — Telephone Encounter (Signed)
Mom checking on rx request for refill on Tramadol that she said the pharmacy was sending over.  [facility] [place].

## 2018-09-01 NOTE — Telephone Encounter (Signed)
RX pending in provider box

## 2018-09-04 NOTE — Telephone Encounter (Signed)
Prescription was already sent in thank you

## 2018-09-06 DIAGNOSIS — F3181 Bipolar II disorder: Secondary | ICD-10-CM | POA: Diagnosis not present

## 2018-09-12 ENCOUNTER — Other Ambulatory Visit: Payer: Self-pay

## 2018-09-12 ENCOUNTER — Other Ambulatory Visit (HOSPITAL_COMMUNITY): Payer: BC Managed Care – PPO

## 2018-09-12 ENCOUNTER — Other Ambulatory Visit (HOSPITAL_COMMUNITY)
Admission: RE | Admit: 2018-09-12 | Discharge: 2018-09-12 | Disposition: A | Discharge status: Home / Self Care | Payer: BC Managed Care – PPO | Source: Ambulatory Visit | Attending: General Surgery | Admitting: General Surgery

## 2018-09-12 DIAGNOSIS — Z20828 Contact with and (suspected) exposure to other viral communicable diseases: Secondary | ICD-10-CM | POA: Insufficient documentation

## 2018-09-12 DIAGNOSIS — Z01812 Encounter for preprocedural laboratory examination: Secondary | ICD-10-CM | POA: Insufficient documentation

## 2018-09-12 LAB — SARS CORONAVIRUS 2 (TAT 6-24 HRS): SARS Coronavirus 2: NEGATIVE

## 2018-09-14 NOTE — Progress Notes (Signed)
Left message npo after midnight arrive 630 am [facility], take the following medications with sip of water: buspirone, klonopin, citalopram, seroquel, inderal, flonase nasal spray, levothyroxine, pepcid. lov cardiology dr Orson Slick 10-10-16 care everywhere released from cardiology per note. Hasdsevere cardiomyoathy 2015 ef 25%, echo 2017 with normal ef, cardiomyopathy resolved.. ekg 10-13-17 epic/chart st rate 146. Has surgery orders in epic. Needs istat 4.left message to arrange driver

## 2018-09-15 ENCOUNTER — Encounter (HOSPITAL_BASED_OUTPATIENT_CLINIC_OR_DEPARTMENT_OTHER): Payer: Self-pay

## 2018-09-15 ENCOUNTER — Ambulatory Visit (HOSPITAL_BASED_OUTPATIENT_CLINIC_OR_DEPARTMENT_OTHER)
Admission: RE | Admit: 2018-09-15 | Discharge: 2018-09-15 | Disposition: A | Discharge status: Home / Self Care | Payer: BC Managed Care – PPO | Attending: General Surgery | Admitting: General Surgery

## 2018-09-15 ENCOUNTER — Encounter (HOSPITAL_BASED_OUTPATIENT_CLINIC_OR_DEPARTMENT_OTHER)
Admission: RE | Disposition: A | Discharge status: Home / Self Care | Payer: Self-pay | Source: Home / Self Care | Attending: General Surgery

## 2018-09-15 ENCOUNTER — Ambulatory Visit (HOSPITAL_BASED_OUTPATIENT_CLINIC_OR_DEPARTMENT_OTHER): Payer: BC Managed Care – PPO | Admitting: Anesthesiology

## 2018-09-15 DIAGNOSIS — E669 Obesity, unspecified: Secondary | ICD-10-CM | POA: Insufficient documentation

## 2018-09-15 DIAGNOSIS — G8929 Other chronic pain: Secondary | ICD-10-CM | POA: Insufficient documentation

## 2018-09-15 DIAGNOSIS — Z9049 Acquired absence of other specified parts of digestive tract: Secondary | ICD-10-CM | POA: Insufficient documentation

## 2018-09-15 DIAGNOSIS — J9601 Acute respiratory failure with hypoxia: Secondary | ICD-10-CM | POA: Diagnosis not present

## 2018-09-15 DIAGNOSIS — F17210 Nicotine dependence, cigarettes, uncomplicated: Secondary | ICD-10-CM | POA: Insufficient documentation

## 2018-09-15 DIAGNOSIS — D62 Acute posthemorrhagic anemia: Secondary | ICD-10-CM | POA: Diagnosis not present

## 2018-09-15 DIAGNOSIS — I429 Cardiomyopathy, unspecified: Secondary | ICD-10-CM | POA: Diagnosis not present

## 2018-09-15 DIAGNOSIS — Z6837 Body mass index (BMI) 37.0-37.9, adult: Secondary | ICD-10-CM | POA: Diagnosis not present

## 2018-09-15 DIAGNOSIS — K5909 Other constipation: Secondary | ICD-10-CM | POA: Insufficient documentation

## 2018-09-15 DIAGNOSIS — K5900 Constipation, unspecified: Secondary | ICD-10-CM | POA: Diagnosis not present

## 2018-09-15 DIAGNOSIS — F329 Major depressive disorder, single episode, unspecified: Secondary | ICD-10-CM | POA: Insufficient documentation

## 2018-09-15 DIAGNOSIS — E039 Hypothyroidism, unspecified: Secondary | ICD-10-CM | POA: Insufficient documentation

## 2018-09-15 DIAGNOSIS — Z932 Ileostomy status: Secondary | ICD-10-CM | POA: Insufficient documentation

## 2018-09-15 HISTORY — PX: RECTAL EXAM UNDER ANESTHESIA: SHX6399

## 2018-09-15 HISTORY — PX: RECTAL BIOPSY: SHX2303

## 2018-09-15 LAB — POCT I-STAT, CHEM 8
BUN: 11 mg/dL (ref 6–20)
Calcium, Ion: 1.35 mmol/L (ref 1.15–1.40)
Chloride: 104 mmol/L (ref 98–111)
Creatinine, Ser: 0.8 mg/dL (ref 0.61–1.24)
Glucose, Bld: 105 mg/dL — ABNORMAL HIGH (ref 70–99)
HCT: 47 % (ref 39.0–52.0)
Hemoglobin: 16 g/dL (ref 13.0–17.0)
Potassium: 4.5 mmol/L (ref 3.5–5.1)
Sodium: 141 mmol/L (ref 135–145)
TCO2: 25 mmol/L (ref 22–32)

## 2018-09-15 SURGERY — EXAM UNDER ANESTHESIA, RECTUM
Anesthesia: Monitor Anesthesia Care

## 2018-09-15 MED ORDER — ACETAMINOPHEN 650 MG RE SUPP
650.0000 mg | RECTAL | Status: DC | PRN
  Filled 2018-09-15: qty 1

## 2018-09-15 MED ORDER — DEXAMETHASONE SODIUM PHOSPHATE 10 MG/ML IJ SOLN
INTRAMUSCULAR | Status: AC
  Filled 2018-09-15: qty 1

## 2018-09-15 MED ORDER — FENTANYL CITRATE (PF) 100 MCG/2ML IJ SOLN
INTRAMUSCULAR | Status: AC
  Filled 2018-09-15: qty 2

## 2018-09-15 MED ORDER — PROPOFOL 500 MG/50ML IV EMUL
INTRAVENOUS | Status: DC | PRN
  Administered 2018-09-15: 100 ug/kg/min via INTRAVENOUS

## 2018-09-15 MED ORDER — BUPIVACAINE-EPINEPHRINE 0.5% -1:200000 IJ SOLN
INTRAMUSCULAR | Status: DC | PRN
  Administered 2018-09-15: 30 mL

## 2018-09-15 MED ORDER — PROPOFOL 500 MG/50ML IV EMUL
INTRAVENOUS | Status: AC
  Filled 2018-09-15: qty 50

## 2018-09-15 MED ORDER — ONDANSETRON HCL 4 MG/2ML IJ SOLN
INTRAMUSCULAR | Status: AC
  Filled 2018-09-15: qty 2

## 2018-09-15 MED ORDER — LACTATED RINGERS IV SOLN
INTRAVENOUS | Status: DC
  Administered 2018-09-15 (×2): via INTRAVENOUS
  Filled 2018-09-15: qty 1000

## 2018-09-15 MED ORDER — FENTANYL CITRATE (PF) 100 MCG/2ML IJ SOLN
INTRAMUSCULAR | Status: DC | PRN
  Administered 2018-09-15 (×2): 25 ug via INTRAVENOUS

## 2018-09-15 MED ORDER — ACETAMINOPHEN 500 MG PO TABS
1000.0000 mg | ORAL_TABLET | ORAL | Status: AC
  Administered 2018-09-15: 1000 mg via ORAL
  Filled 2018-09-15: qty 2

## 2018-09-15 MED ORDER — LIDOCAINE 2% (20 MG/ML) 5 ML SYRINGE
INTRAMUSCULAR | Status: AC
  Filled 2018-09-15: qty 5

## 2018-09-15 MED ORDER — ACETAMINOPHEN 325 MG PO TABS
650.0000 mg | ORAL_TABLET | ORAL | Status: DC | PRN
  Filled 2018-09-15: qty 2

## 2018-09-15 MED ORDER — ACETAMINOPHEN 500 MG PO TABS
ORAL_TABLET | ORAL | Status: AC
  Filled 2018-09-15: qty 2

## 2018-09-15 MED ORDER — LIDOCAINE 5 % EX OINT
TOPICAL_OINTMENT | CUTANEOUS | Status: DC | PRN
  Administered 2018-09-15: 1 via TOPICAL

## 2018-09-15 MED ORDER — SODIUM CHLORIDE 0.9% FLUSH
3.0000 mL | Freq: Two times a day (BID) | INTRAVENOUS | Status: DC
  Filled 2018-09-15: qty 3

## 2018-09-15 MED ORDER — ONDANSETRON HCL 4 MG/2ML IJ SOLN
INTRAMUSCULAR | Status: DC | PRN
  Administered 2018-09-15: 4 mg via INTRAVENOUS

## 2018-09-15 MED ORDER — SODIUM CHLORIDE 0.9% FLUSH
3.0000 mL | INTRAVENOUS | Status: DC | PRN
  Filled 2018-09-15: qty 3

## 2018-09-15 MED ORDER — KETOROLAC TROMETHAMINE 30 MG/ML IJ SOLN
INTRAMUSCULAR | Status: AC
  Filled 2018-09-15: qty 1

## 2018-09-15 MED ORDER — PROPOFOL 10 MG/ML IV BOLUS
INTRAVENOUS | Status: DC | PRN
  Administered 2018-09-15: 20 mg via INTRAVENOUS

## 2018-09-15 MED ORDER — OXYCODONE HCL 5 MG PO TABS
5.0000 mg | ORAL_TABLET | ORAL | Status: DC | PRN
  Filled 2018-09-15: qty 2

## 2018-09-15 MED ORDER — SODIUM CHLORIDE 0.9 % IV SOLN
250.0000 mL | INTRAVENOUS | Status: DC | PRN
  Filled 2018-09-15: qty 250

## 2018-09-15 MED ORDER — LIDOCAINE HCL (CARDIAC) PF 100 MG/5ML IV SOSY
PREFILLED_SYRINGE | INTRAVENOUS | Status: DC | PRN
  Administered 2018-09-15: 60 mg via INTRAVENOUS

## 2018-09-15 MED ORDER — MIDAZOLAM HCL 2 MG/2ML IJ SOLN
INTRAMUSCULAR | Status: AC
  Filled 2018-09-15: qty 2

## 2018-09-15 SURGICAL SUPPLY — 54 items
BENZOIN TINCTURE PRP APPL 2/3 (GAUZE/BANDAGES/DRESSINGS) ×3 IMPLANT
BLADE EXTENDED COATED 6.5IN (ELECTRODE) IMPLANT
BLADE HEX COATED 2.75 (ELECTRODE) ×2 IMPLANT
BLADE SURG 10 STRL SS (BLADE) IMPLANT
BRIEF STRETCH FOR OB PAD LRG (UNDERPADS AND DIAPERS) ×2 IMPLANT
CANISTER SUCT 3000ML PPV (MISCELLANEOUS) ×2 IMPLANT
COVER BACK TABLE 60X90IN (DRAPES) ×2 IMPLANT
COVER MAYO STAND STRL (DRAPES) ×2 IMPLANT
COVER WAND RF STERILE (DRAPES) ×2 IMPLANT
DECANTER SPIKE VIAL GLASS SM (MISCELLANEOUS) ×2 IMPLANT
DRAPE LAPAROTOMY 100X72 PEDS (DRAPES) ×2 IMPLANT
DRAPE UTILITY XL STRL (DRAPES) ×2 IMPLANT
DRSG PAD ABDOMINAL 8X10 ST (GAUZE/BANDAGES/DRESSINGS) ×1 IMPLANT
ELECT REM PT RETURN 9FT ADLT (ELECTROSURGICAL) ×2
ELECTRODE REM PT RTRN 9FT ADLT (ELECTROSURGICAL) ×1 IMPLANT
GAUZE SPONGE 4X4 12PLY STRL (GAUZE/BANDAGES/DRESSINGS) ×2 IMPLANT
GLOVE BIO SURGEON STRL SZ 6.5 (GLOVE) ×3 IMPLANT
GLOVE BIOGEL PI IND STRL 6.5 (GLOVE) IMPLANT
GLOVE BIOGEL PI IND STRL 7.0 (GLOVE) ×1 IMPLANT
GLOVE BIOGEL PI INDICATOR 6.5 (GLOVE) ×1
GLOVE BIOGEL PI INDICATOR 7.0 (GLOVE) ×1
GOWN STRL REUS W/ TWL LRG LVL3 (GOWN DISPOSABLE) IMPLANT
GOWN STRL REUS W/TWL LRG LVL3 (GOWN DISPOSABLE) ×1
GOWN STRL REUS W/TWL XL LVL3 (GOWN DISPOSABLE) ×2 IMPLANT
HYDROGEN PEROXIDE 16OZ (MISCELLANEOUS) ×2 IMPLANT
IV CATH 14GX2 1/4 (CATHETERS) ×2 IMPLANT
IV CATH 18G SAFETY (IV SOLUTION) ×2 IMPLANT
KIT SIGMOIDOSCOPE (SET/KITS/TRAYS/PACK) IMPLANT
KIT TURNOVER CYSTO (KITS) ×2 IMPLANT
LOOP VESSEL MAXI BLUE (MISCELLANEOUS) IMPLANT
NEEDLE HYPO 22GX1.5 SAFETY (NEEDLE) ×2 IMPLANT
NS IRRIG 500ML POUR BTL (IV SOLUTION) ×2 IMPLANT
PACK BASIN DAY SURGERY FS (CUSTOM PROCEDURE TRAY) ×2 IMPLANT
PAD ABD 8X10 STRL (GAUZE/BANDAGES/DRESSINGS) ×2 IMPLANT
PAD ARMBOARD 7.5X6 YLW CONV (MISCELLANEOUS) IMPLANT
PENCIL BUTTON HOLSTER BLD 10FT (ELECTRODE) ×2 IMPLANT
SPONGE HEMORRHOID 8X3CM (HEMOSTASIS) IMPLANT
SPONGE SURGIFOAM ABS GEL 12-7 (HEMOSTASIS) IMPLANT
SUCTION FRAZIER HANDLE 10FR (MISCELLANEOUS)
SUCTION TUBE FRAZIER 10FR DISP (MISCELLANEOUS) IMPLANT
SUT CHROMIC 2 0 SH (SUTURE) IMPLANT
SUT CHROMIC 3 0 SH 27 (SUTURE) IMPLANT
SUT ETHIBOND 0 (SUTURE) IMPLANT
SUT VIC AB 2-0 SH 27 (SUTURE)
SUT VIC AB 2-0 SH 27XBRD (SUTURE) IMPLANT
SUT VIC AB 3-0 SH 18 (SUTURE) IMPLANT
SUT VIC AB 3-0 SH 27 (SUTURE) ×1
SUT VIC AB 3-0 SH 27X BRD (SUTURE) IMPLANT
SUT VIC AB 3-0 SH 27XBRD (SUTURE) IMPLANT
SYR CONTROL 10ML LL (SYRINGE) ×2 IMPLANT
TOWEL OR 17X26 10 PK STRL BLUE (TOWEL DISPOSABLE) ×2 IMPLANT
TRAY DSU PREP LF (CUSTOM PROCEDURE TRAY) ×2 IMPLANT
TUBE CONNECTING 12X1/4 (SUCTIONS) ×2 IMPLANT
YANKAUER SUCT BULB TIP NO VENT (SUCTIONS) ×2 IMPLANT

## 2018-09-15 NOTE — Anesthesia Procedure Notes (Signed)
Procedure Name: MAC Date/Time: 09/15/2018 9:16 AM Performed by: Justice Rocher, CRNA Pre-anesthesia Checklist: Patient identified, Emergency Drugs available, Suction available, Patient being monitored and Timeout performed Patient Re-evaluated:Patient Re-evaluated prior to induction Oxygen Delivery Method: Simple face mask Preoxygenation: Pre-oxygenation with 100% oxygen Induction Type: IV induction Placement Confirmation: positive ETCO2 and breath sounds checked- equal and bilateral

## 2018-09-15 NOTE — Anesthesia Preprocedure Evaluation (Addendum)
Anesthesia Evaluation  Patient identified by MRN, date of birth, ID band Patient awake    Reviewed: Allergy & Precautions, NPO status , Patient's Chart, lab work & pertinent test results  History of Anesthesia Complications Negative for: history of anesthetic complications  Airway Mallampati: II  TM Distance: >3 FB Neck ROM: Full    Dental  (+) Poor Dentition, Chipped, Missing, Dental Advisory Given   Pulmonary Current Smoker and Patient abstained from smoking., PE 09/12/2018 SARS coronavirus NEG   breath sounds clear to auscultation       Cardiovascular negative cardio ROS   Rhythm:Regular Rate:Normal  '19 ECHO: Left ventricle: The cavity size was normal. Wall thickness was normal. Systolic function was normal. EF 55% to 60%. Mild TR   Neuro/Psych PSYCHIATRIC DISORDERS (ADD) Anxiety Depression negative neurological ROS     GI/Hepatic GERD  Medicated,(+)     substance abuse (h/o heroin use in the past)  , Chronic constipation   Endo/Other  Hypothyroidism obese  Renal/GU negative Renal ROS     Musculoskeletal   Abdominal (+) + obese,   Peds  Hematology eliquis   Anesthesia Other Findings   Reproductive/Obstetrics                            Anesthesia Physical Anesthesia Plan  ASA: III  Anesthesia Plan: MAC   Post-op Pain Management:    Induction:   PONV Risk Score and Plan: 0 and Ondansetron and Treatment may vary due to age or medical condition  Airway Management Planned: Natural Airway and Simple Face Mask  Additional Equipment:   Intra-op Plan:   Post-operative Plan:   Informed Consent: I have reviewed the patients History and Physical, chart, labs and discussed the procedure including the risks, benefits and alternatives for the proposed anesthesia with the patient or authorized representative who has indicated his/her understanding and acceptance.     Dental advisory  given  Plan Discussed with: CRNA and Surgeon  Anesthesia Plan Comments:        Anesthesia Quick Evaluation

## 2018-09-15 NOTE — Transfer of Care (Signed)
Immediate Anesthesia Transfer of Care Note  Patient: Philip Thompson  Procedure(s) Performed: Procedure(s) (LRB): ANAL EXAM UNDER ANESTHESIA (N/A) RECTAL WALL BIOSPY (N/A)  Patient Location: PACU  Anesthesia Type: MAC  Level of Consciousness: awake, sedated, patient cooperative and responds to stimulation  Airway & Oxygen Therapy: Patient Spontanous Breathing and Patient connected to face mask oxygen  Post-op Assessment: Report given to PACU RN, Post -op Vital signs reviewed and stable and Patient moving all extremities  Post vital signs: Reviewed and stable  Complications: No apparent anesthesia complications

## 2018-09-15 NOTE — Discharge Instructions (Addendum)

## 2018-09-15 NOTE — H&P (Signed)
HPI Patient was emergently taken to the operating room for total abdominal colectomy due to megacolon from constipation.  Patient now has an ileostomy and a rectal stump.  He is undergoing work-up for evaluation for Hirschsprung's disease.  Anal manometry was equivocal for this.  Past Medical History:  Diagnosis Date  . ADD (attention deficit disorder)   . Aggression   . Allergic rhinitis   . Anxiety   . Arthritis    joint pain  . Cardiomyopathy ([place])    a. EF <25% in 2015 during admission for multi substance intoxication/respiratory failure requiring intubation.  . Chronic back pain   . Constipation   . Depression   . Fecal obstruction ([place])   . Frontal head injury   . Heroin overdose ([place])   . Obstipation   . Polysubstance abuse ([place])   . Suicide attempt [facility])    Past Surgical History:  Procedure Laterality Date  . ANAL RECTAL MANOMETRY N/A 08/11/2018   Procedure: ANAL MANOMETRY;  Surgeon: Leighton Ruff, MD;  Location: [facility];  Service: Endoscopy;  Laterality: N/A;  . COLECTOMY N/A 09/27/2017   Procedure: SUBTOTAL COLECTOMY AND ILEOSTOMY;  Surgeon: Coralie Keens, MD;  Location: [place];  Service: General;  Laterality: N/A;  . FOOT SURGERY     to get a BB out  . IR GASTR TUBE CONVERT GASTR-JEJ PER W/FL MOD SED  10/19/2017  . IR GASTROSTOMY TUBE MOD SED  10/17/2017  . IR GASTROSTOMY TUBE REMOVAL  12/22/2017  . IR IVC FILTER PLMT / S&I /IMG GUID/MOD SED  10/02/2017  . NO PAST SURGERIES     Family History  Problem Relation Age of Onset  . Dementia Father   . Alcohol abuse Father   . Mental illness Brother   . Diabetes Brother   . Cancer - Ovarian Maternal Grandmother   . Cancer - Lung Paternal Grandmother   . Cancer Paternal Grandfather   . Diabetes Other   . Hypertension Other   . Heart disease Other   . Heart attack Other   . Fibromyalgia Mother   . Thyroid disease Mother   . Colitis Mother   . Spina bifida Sister   . Mental illness Sister   . Hepatitis C  Sister   . Mental illness Brother    Social History   Socioeconomic History  . Marital status: Single    Spouse name: Not on file  . Number of children: Not on file  . Years of education: Not on file  . Highest education level: 12th grade  Occupational History  . Not on file  Social Needs  . Financial resource strain: Somewhat hard  . Food insecurity    Worry: Never true    Inability: Never true  . Transportation needs    Medical: No    Non-medical: No  Tobacco Use  . Smoking status: Current Every Day Smoker    Packs/day: 0.50    Years: 10.00    Pack years: 5.00    Types: Cigarettes  . Smokeless tobacco: Never Used  Substance and Sexual Activity  . Alcohol use: Not Currently    Comment: hx of. last use 01/2016  . Drug use: Not Currently    Types: Marijuana, Cocaine    Comment: last used last Oct. 2019  . Sexual activity: Yes    Birth control/protection: Condom  Lifestyle  . Physical activity    Days per week: 0 days    Minutes per session: 0 min  . Stress: To  some extent  Relationships  . Social connections    Talks on phone: More than three times a week    Gets together: Twice a week    Attends religious service: Never    Active member of club or organization: No    Attends meetings of clubs or organizations: Never    Relationship status: Never married  . Intimate partner violence    Fear of current or ex partner: No    Emotionally abused: No    Physically abused: No    Forced sexual activity: No  Other Topics Concern  . Not on file  Social History Narrative      Resides with parents    Current Meds  Medication Sig  . apixaban (ELIQUIS) 5 MG TABS tablet Take 1 tablet (5 mg total) by mouth 2 (two) times daily.  . busPIRone (BUSPAR) 15 MG tablet Take 15 mg by mouth 2 (two) times daily.   . citalopram (CELEXA) 40 MG tablet Take 40 mg by mouth daily.   . clonazePAM (KLONOPIN) 1 MG tablet Place 1 tablet (1 mg total) into feeding tube every 8 (eight) hours.  (Patient taking differently: Take 1 mg by mouth daily. )  . divalproex (DEPAKOTE) 500 MG DR tablet Take 500 mg by mouth 2 (two) times daily.   . famotidine (PEPCID) 20 MG tablet Take 20 mg by mouth daily.   . fluticasone (FLONASE) 50 MCG/ACT nasal spray Place 2 sprays into both nostrils daily.  Marland Kitchen levothyroxine (SYNTHROID) 75 MCG tablet TAKE ONE TABLET EACH MORNING BEFORE BREAKFAST  . mupirocin ointment (BACTROBAN) 2 % Apply BID to base of Gtube.  . propranolol (INDERAL) 40 MG tablet Take 40 mg by mouth 2 (two) times daily.   . QUEtiapine (SEROQUEL) 400 MG tablet Take 1,200 mg by mouth daily. Per pt he takes 1,200 mg daily  . traZODone (DESYREL) 150 MG tablet Take 50 mg by mouth 2 (two) times daily.   No Known Allergies Review of Systems - General ROS: negative for - chills or fever Respiratory ROS: no cough, shortness of breath, or wheezing Cardiovascular ROS: no chest pain or dyspnea on exertion Gastrointestinal ROS: no abdominal pain, change in bowel habits, or black or bloody stools Genito-Urinary ROS: no dysuria, trouble voiding, or hematuria   Physical Exam Constitutional:      Appearance: Normal appearance.  HENT:     Head: Normocephalic and atraumatic.     Nose: Nose normal.     Mouth/Throat:     Mouth: Mucous membranes are moist.  Eyes:     Extraocular Movements: Extraocular movements intact.     Pupils: Pupils are equal, round, and reactive to light.  Neck:     Musculoskeletal: Normal range of motion.  Cardiovascular:     Rate and Rhythm: Normal rate and regular rhythm.  Pulmonary:     Effort: Pulmonary effort is normal. No respiratory distress.  Abdominal:     General: There is no distension.     Palpations: Abdomen is soft.  Musculoskeletal: Normal range of motion.  Skin:    General: Skin is warm and dry.  Neurological:     Mental Status: He is alert.   Assessment Chronic constipation  Plan: To operating room for full-thickness rectal biopsy to rule out  Hirschsprung's disease.  Risks include infection, bleeding and pain.  This was explained to the patient and his guardian.  All questions were answered.  They have agreed to proceed with surgery.

## 2018-09-15 NOTE — Op Note (Signed)
09/15/2018  9:39 AM  PATIENT:  Philip Thompson  28 y.o. male  Patient Care Team: [name], MD as PCP - General (Family Medicine)  PRE-OPERATIVE DIAGNOSIS:  CHRONIC CONSTIPATION  POST-OPERATIVE DIAGNOSIS:  CHRONIC CONSTIPATION  PROCEDURE:  ANAL EXAM UNDER ANESTHESIA RECTAL WALL BIOSPY   Surgeon(s): Leighton Ruff, MD  ASSISTANT: none   ANESTHESIA:   local and MAC  SPECIMEN:  Source of Specimen:  rectal wall biopsy  DISPOSITION OF SPECIMEN:  PATHOLOGY  COUNTS:  YES  PLAN OF CARE: Discharge to home after PACU  PATIENT DISPOSITION:  PACU - hemodynamically stable.  INDICATION: 28 y.o. M with chronic constipation.  He was hospitalized approximately 6 months ago with megacolon and required emergent total abdominal colectomy by Dr. Ninfa Linden.  He underwent a work-up for pelvic floor disorders.  Anal manometry showed equivocal findings for Hirschsprung's disease with a equivocal RAIR.  He is here today for rectal wall biopsy   OR FINDINGS: Hardened stool noted within rectal vault.  DESCRIPTION: the patient was identified in the preoperative holding area and taken to the OR where they were laid on the operating room table.  MAC anesthesia was induced without difficulty. The patient was then positioned in prone jackknife position with buttocks gently taped apart.  The patient was then prepped and draped in usual sterile fashion.  SCDs were noted to be in place prior to the initiation of anesthesia. A surgical timeout was performed indicating the correct patient, procedure, positioning and need for preoperative antibiotics.  A rectal block was performed using Marcaine with epinephrine.    I began with a digital rectal exam.  The patient had normal rectal tone.  Normal anal wink reflex.  I then placed a Hill-Ferguson anoscope into the anal canal and evaluated this completely.  There was no hemorrhoid disease.  Identified a portion of the distal rectum approximately 4 cm above the  dentate line.  This was elevated with an Allis clamp.  It was incised sharply using Metzenbaum scissors.  I then closed the defect using a 3-0 Vicryl suture.  Hemostasis was good.  The patient was awakened from anesthesia and sent to the postanesthesia care unit in stable condition.  All counts were correct per operating room staff.

## 2018-09-15 NOTE — Anesthesia Postprocedure Evaluation (Signed)
Anesthesia Post Note  Patient: Zyquan L Langenbach  Procedure(s) Performed: ANAL EXAM UNDER ANESTHESIA (N/A ) RECTAL WALL BIOSPY (N/A )     Patient location during evaluation: Phase II Anesthesia Type: MAC Level of consciousness: awake and alert, patient cooperative and oriented Pain management: pain level controlled Vital Signs Assessment: post-procedure vital signs reviewed and stable Respiratory status: spontaneous breathing, nonlabored ventilation and respiratory function stable Cardiovascular status: blood pressure returned to baseline and stable Postop Assessment: no apparent nausea or vomiting, able to ambulate and adequate PO intake Anesthetic complications: no    Last Vitals:  Vitals:   09/15/18 1000 09/15/18 1015  BP: (!) 151/63 (!) 143/82  Pulse: 93 96  Resp: 19 14  Temp:    SpO2: 98% 99%    Last Pain:  Vitals:   09/15/18 1015  TempSrc:   PainSc: 1                  Juliany Daughety,E. Katlyne Nishida

## 2018-09-18 ENCOUNTER — Encounter (HOSPITAL_BASED_OUTPATIENT_CLINIC_OR_DEPARTMENT_OTHER): Payer: Self-pay | Admitting: General Surgery

## 2018-09-21 ENCOUNTER — Telehealth: Payer: Self-pay | Admitting: Family Medicine

## 2018-09-21 NOTE — Telephone Encounter (Signed)
Nurses I received a document from his lawyer regarding disability  We will be happy to fill this out but in order to help his case I need for him to list out some answers to some questions and submit these to me  The complete form will be filled out at the time of a office visit  The office visit can be done in person or virtual but it is necessary to fill out some additional questions before that visit and submit those to [place] (The patient can pick up a copy of these questions fill them out and then drop that back off for my attention) I would recommend that the patient work hard at doing this questionnaire over the next several days Then we could do a office visit in person or virtual toward the end of next week as long as I have those questionnaire filled out  Please see copy of questionnaire with highlighted areas for the patient to fill in and return to [place]

## 2018-09-21 NOTE — Telephone Encounter (Signed)
Contacted mom and informed mom that we need to set up an office visit for patient to discuss questionnaire related to disability. Informed mom that the questionnaire is to be filled out before hand and sent back to [place]. Mom verbalized understanding and transferred up front to schedule appt. Questionnaire mailed to patient per mom request

## 2018-09-22 DIAGNOSIS — Z933 Colostomy status: Secondary | ICD-10-CM | POA: Diagnosis not present

## 2018-09-22 DIAGNOSIS — K56609 Unspecified intestinal obstruction, unspecified as to partial versus complete obstruction: Secondary | ICD-10-CM | POA: Diagnosis not present

## 2018-09-27 ENCOUNTER — Ambulatory Visit: Payer: BC Managed Care – PPO | Admitting: Family Medicine

## 2018-09-28 ENCOUNTER — Telehealth: Payer: Self-pay | Admitting: Family Medicine

## 2018-09-28 NOTE — Telephone Encounter (Signed)
Please consult home health because of colostomy bag  Hopefully they can help him out as well as do colostomy education

## 2018-09-28 NOTE — Telephone Encounter (Signed)
Patient's mom is requesting an order for home health aid to come out and help patient due to the fact he has a colostomy bag.Mom states he needs help keeping it changed out and taped down. Please Advise

## 2018-09-29 ENCOUNTER — Encounter (HOSPITAL_COMMUNITY): Payer: Self-pay | Admitting: Emergency Medicine

## 2018-09-29 ENCOUNTER — Other Ambulatory Visit: Payer: Self-pay

## 2018-09-29 ENCOUNTER — Other Ambulatory Visit: Payer: Self-pay | Admitting: *Deleted

## 2018-09-29 ENCOUNTER — Emergency Department (HOSPITAL_COMMUNITY)
Admission: EM | Admit: 2018-09-29 | Discharge: 2018-09-30 | Disposition: A | Discharge status: Home / Self Care | Payer: BC Managed Care – PPO | Attending: Emergency Medicine | Admitting: Emergency Medicine

## 2018-09-29 DIAGNOSIS — Z433 Encounter for attention to colostomy: Secondary | ICD-10-CM

## 2018-09-29 DIAGNOSIS — S069X9A Unspecified intracranial injury with loss of consciousness of unspecified duration, initial encounter: Secondary | ICD-10-CM | POA: Diagnosis present

## 2018-09-29 DIAGNOSIS — R4689 Other symptoms and signs involving appearance and behavior: Secondary | ICD-10-CM | POA: Diagnosis not present

## 2018-09-29 DIAGNOSIS — F4310 Post-traumatic stress disorder, unspecified: Secondary | ICD-10-CM | POA: Insufficient documentation

## 2018-09-29 DIAGNOSIS — Z7901 Long term (current) use of anticoagulants: Secondary | ICD-10-CM | POA: Diagnosis not present

## 2018-09-29 DIAGNOSIS — F332 Major depressive disorder, recurrent severe without psychotic features: Secondary | ICD-10-CM | POA: Insufficient documentation

## 2018-09-29 DIAGNOSIS — Z79899 Other long term (current) drug therapy: Secondary | ICD-10-CM | POA: Diagnosis not present

## 2018-09-29 DIAGNOSIS — R456 Violent behavior: Secondary | ICD-10-CM | POA: Diagnosis not present

## 2018-09-29 DIAGNOSIS — S069XAA Unspecified intracranial injury with loss of consciousness status unknown, initial encounter: Secondary | ICD-10-CM | POA: Diagnosis present

## 2018-09-29 DIAGNOSIS — F17210 Nicotine dependence, cigarettes, uncomplicated: Secondary | ICD-10-CM | POA: Diagnosis not present

## 2018-09-29 LAB — COMPREHENSIVE METABOLIC PANEL
ALT: 19 U/L (ref 0–44)
AST: 14 U/L — ABNORMAL LOW (ref 15–41)
Albumin: 4.2 g/dL (ref 3.5–5.0)
Alkaline Phosphatase: 71 U/L (ref 38–126)
Anion gap: 8 (ref 5–15)
BUN: 12 mg/dL (ref 6–20)
CO2: 24 mmol/L (ref 22–32)
Calcium: 9.3 mg/dL (ref 8.9–10.3)
Chloride: 106 mmol/L (ref 98–111)
Creatinine, Ser: 0.83 mg/dL (ref 0.61–1.24)
GFR calc Af Amer: >60 mL/min (ref >60)
GFR calc non Af Amer: >60 mL/min (ref >60)
Glucose, Bld: 87 mg/dL (ref 70–99)
Potassium: 4 mmol/L (ref 3.5–5.1)
Sodium: 138 mmol/L (ref 135–145)
Total Bilirubin: 0.5 mg/dL (ref 0.3–1.2)
Total Protein: 7.2 g/dL (ref 6.5–8.1)

## 2018-09-29 LAB — RAPID URINE DRUG SCREEN, HOSP PERFORMED
Amphetamines: NOT DETECTED
Barbiturates: NOT DETECTED
Benzodiazepines: POSITIVE — AB
Cocaine: NOT DETECTED
Opiates: POSITIVE — AB
Tetrahydrocannabinol: POSITIVE — AB

## 2018-09-29 LAB — CBC WITH DIFFERENTIAL/PLATELET
Abs Immature Granulocytes: 0.03 10*3/uL (ref 0.00–0.07)
Basophils Absolute: 0.1 10*3/uL (ref 0.0–0.1)
Basophils Relative: 1 %
Eosinophils Absolute: 0.2 10*3/uL (ref 0.0–0.5)
Eosinophils Relative: 2 %
HCT: 42.7 % (ref 39.0–52.0)
Hemoglobin: 13.4 g/dL (ref 13.0–17.0)
Immature Granulocytes: 0 %
Lymphocytes Relative: 20 %
Lymphs Abs: 1.7 10*3/uL (ref 0.7–4.0)
MCH: 28.3 pg (ref 26.0–34.0)
MCHC: 31.4 g/dL (ref 30.0–36.0)
MCV: 90.3 fL (ref 80.0–100.0)
Monocytes Absolute: 0.8 10*3/uL (ref 0.1–1.0)
Monocytes Relative: 10 %
Neutro Abs: 5.7 10*3/uL (ref 1.7–7.7)
Neutrophils Relative %: 67 %
Platelets: 168 10*3/uL (ref 150–400)
RBC: 4.73 MIL/uL (ref 4.22–5.81)
RDW: 14 % (ref 11.5–15.5)
WBC: 8.5 10*3/uL (ref 4.0–10.5)
nRBC: 0 % (ref 0.0–0.2)

## 2018-09-29 LAB — VALPROIC ACID LEVEL: Valproic Acid Lvl: 13 ug/mL — ABNORMAL LOW (ref 50.0–100.0)

## 2018-09-29 LAB — ETHANOL: Alcohol, Ethyl (B): <10 mg/dL (ref <10)

## 2018-09-29 MED ORDER — FAMOTIDINE 20 MG PO TABS
20.0000 mg | ORAL_TABLET | Freq: Every day | ORAL | Status: DC
  Administered 2018-09-29 – 2018-09-30 (×2): 20 mg via ORAL
  Filled 2018-09-29 (×2): qty 1

## 2018-09-29 MED ORDER — BUSPIRONE HCL 5 MG PO TABS
15.0000 mg | ORAL_TABLET | Freq: Two times a day (BID) | ORAL | Status: DC
  Administered 2018-09-29: 15 mg via ORAL
  Filled 2018-09-29: qty 3

## 2018-09-29 MED ORDER — DIVALPROEX SODIUM 250 MG PO DR TAB
500.0000 mg | DELAYED_RELEASE_TABLET | Freq: Two times a day (BID) | ORAL | Status: DC
  Administered 2018-09-29 – 2018-09-30 (×2): 500 mg via ORAL
  Filled 2018-09-29 (×2): qty 2

## 2018-09-29 MED ORDER — QUETIAPINE FUMARATE 100 MG PO TABS
1200.0000 mg | ORAL_TABLET | Freq: Every day | ORAL | Status: DC
  Administered 2018-09-29: 1200 mg via ORAL
  Filled 2018-09-29: qty 12

## 2018-09-29 MED ORDER — CLONAZEPAM 0.5 MG PO TABS
1.0000 mg | ORAL_TABLET | Freq: Every day | ORAL | Status: DC
  Administered 2018-09-29 – 2018-09-30 (×2): 1 mg via ORAL
  Filled 2018-09-29 (×2): qty 2

## 2018-09-29 MED ORDER — LEVOTHYROXINE SODIUM 50 MCG PO TABS
75.0000 ug | ORAL_TABLET | Freq: Once | ORAL | Status: AC
  Administered 2018-09-29: 75 ug via ORAL
  Filled 2018-09-29: qty 2

## 2018-09-29 MED ORDER — APIXABAN 5 MG PO TABS
5.0000 mg | ORAL_TABLET | Freq: Two times a day (BID) | ORAL | Status: DC
  Administered 2018-09-29 – 2018-09-30 (×2): 5 mg via ORAL
  Filled 2018-09-29 (×2): qty 1

## 2018-09-29 MED ORDER — TRAZODONE HCL 50 MG PO TABS
50.0000 mg | ORAL_TABLET | Freq: Two times a day (BID) | ORAL | Status: DC
  Administered 2018-09-29 – 2018-09-30 (×2): 50 mg via ORAL
  Filled 2018-09-29 (×2): qty 1

## 2018-09-29 MED ORDER — CITALOPRAM HYDROBROMIDE 20 MG PO TABS
40.0000 mg | ORAL_TABLET | Freq: Every day | ORAL | Status: DC
  Administered 2018-09-29 – 2018-09-30 (×2): 40 mg via ORAL
  Filled 2018-09-29 (×4): qty 2

## 2018-09-29 MED ORDER — PANTOPRAZOLE SODIUM 40 MG PO TBEC: 40.0000 mg | DELAYED_RELEASE_TABLET | Freq: Every day | ORAL | Status: DC

## 2018-09-29 MED ORDER — PROPRANOLOL HCL 10 MG PO TABS
40.0000 mg | ORAL_TABLET | Freq: Two times a day (BID) | ORAL | Status: DC
  Administered 2018-09-29 – 2018-09-30 (×2): 40 mg via ORAL
  Filled 2018-09-29 (×2): qty 4
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
## 2018-09-29 NOTE — ED Notes (Signed)
Pt belongings secured in locker. [name] room stripped of all cords, and harmful objects. Security wanded Pt.

## 2018-09-29 NOTE — ED Notes (Signed)
Pts mother called and asked if she could bring Pts ostomy supplies to ED from home. RN Peach took the phone call informed [name] mother this would be okay.

## 2018-09-29 NOTE — BH Assessment (Signed)
Spoke to intake staff at [facility]. She Pt is psychiatrically appropriate for their unit however in order for Pt to be accepted he must have the appropriate colostomy equipment and the ability to care for his condition independently. I told her we would contact [facility] should his situation change.   Evelena Peat, [facility], [facility], [facility] Triage Specialist [phone]
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
## 2018-09-29 NOTE — BH Assessment (Signed)
[facility] Assessment Progress Note  Case was staffed with Romilda Garret NP who recommended inpatient admission to assist with stabilization.

## 2018-09-29 NOTE — Care Management (Signed)
Under Review: [place]  [place]  [place]  [place]  [place]  [place]

## 2018-09-29 NOTE — Telephone Encounter (Signed)
Referral put in and pt notified 

## 2018-09-29 NOTE — ED Notes (Signed)
Pts skin is excoriated around stoma site and colostomy pouch will not adhere to skin. Pt has constant fecal matter draining at site and multiple nurses have attempted to collaborate on a solution for Pt at this time. Pt otherwise remains cooperative and willing to work with staff.
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
## 2018-09-30 NOTE — ED Notes (Signed)
Patient's IVC papers rescinded. Patient being discharged. Patient's personal belongings given back.

## 2018-09-30 NOTE — ED Notes (Signed)
Patient being discharged. Colostomy bag changed per patient's request, tolerated well. Patient getting dressed. Mother here in parking lot to get patient.

## 2018-09-30 NOTE — Progress Notes (Signed)
CSW spoke with Stephanie/Intake at [facility] at [phone] to ask about a bed for patient. Colletta Maryland stated that patient is still under review, and she is waiting for the nurse to get in to review the referral. She stated she will call back when she receives information.    Netta Neat, MSW, LCSW Clinical Social Work
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
## 2018-09-30 NOTE — ED Notes (Signed)
Pt given breakfast tray

## 2018-09-30 NOTE — BH Assessment (Signed)
Received call back from [name] mother/petitioner, Loreli Slot [phone]. She says that she spoke with [name] psychiatrist after petitioning for IVC and that he explained to her that Pt has a head injury and that it isn't uncommon for [name] to have anger outbursts. She says she now believes that she didn't handle the episode today as well as she could have and would like for the physician to consider Pt be allowed to return home. She says she could benefit from counseling on how to best help her son. She says she sees Pt daily to help care for him. She states Pt is having a difficult time with his colostomy and home health is scheduled to start services on Monday. She says she hope he will not miss this appointment.   Relayed information to Dr. Rolland Porter. She asked that Pt be evaluated by psychiatrist in the morning for recommendation. Documented on TTS shift report that Pt is to be seen by psychiatry today.   Evelena Peat, [facility], [facility], [facility] Triage Specialist [phone]
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
## 2018-09-30 NOTE — ED Provider Notes (Signed)
Psych team has re-evaluated pt this morning: pt does not meet inpt criteria at this time, IVC can be rescinded and pt can be d/c home with family and Family is agreeable with plan. Will d/c stable.    Francine Graven, DO 09/30/18 [phone]

## 2018-09-30 NOTE — Progress Notes (Signed)
Pt declined at [facility] due to being at capacity.  Lind Covert, MSW, LCSW Therapeutic Triage Specialist  [phone]

## 2018-09-30 NOTE — BH Assessment (Signed)
Received call from Julaine Hua, RN stating that [name] mother/petitioner, Loreli Slot [phone], requested to speak to TTS. Left voicemail for Ms Holly Bodily stating TTS was returning her call.   Evelena Peat, [facility], [facility], [facility] Triage Specialist [phone]

## 2018-09-30 NOTE — Discharge Instructions (Signed)
Take your usual prescriptions as previously directed.  Call your regular medical doctor and your mental health provider on Monday to schedule a follow up appointment within the next week.  Return to the Emergency Department immediately sooner if worsening.

## 2018-10-02 NOTE — ED Notes (Addendum)
Pt had a nurse tech stationed outside of his room. Pt never made any SI comments to me. Brain Patraw RN and Darrol Poke attempted to place  bags for the pt. Pt was not cleared medically to have room cleared for psy.

## 2018-10-04 ENCOUNTER — Other Ambulatory Visit: Payer: Self-pay

## 2018-10-04 ENCOUNTER — Inpatient Hospital Stay (HOSPITAL_COMMUNITY): Payer: BC Managed Care – PPO | Attending: Hematology

## 2018-10-04 DIAGNOSIS — Z7901 Long term (current) use of anticoagulants: Secondary | ICD-10-CM | POA: Insufficient documentation

## 2018-10-04 DIAGNOSIS — Z79899 Other long term (current) drug therapy: Secondary | ICD-10-CM | POA: Insufficient documentation

## 2018-10-04 DIAGNOSIS — I2699 Other pulmonary embolism without acute cor pulmonale: Secondary | ICD-10-CM

## 2018-10-04 DIAGNOSIS — F17210 Nicotine dependence, cigarettes, uncomplicated: Secondary | ICD-10-CM | POA: Diagnosis not present

## 2018-10-04 DIAGNOSIS — Z8041 Family history of malignant neoplasm of ovary: Secondary | ICD-10-CM | POA: Insufficient documentation

## 2018-10-04 DIAGNOSIS — Z801 Family history of malignant neoplasm of trachea, bronchus and lung: Secondary | ICD-10-CM | POA: Insufficient documentation

## 2018-10-04 DIAGNOSIS — F329 Major depressive disorder, single episode, unspecified: Secondary | ICD-10-CM | POA: Diagnosis not present

## 2018-10-04 LAB — COMPREHENSIVE METABOLIC PANEL
ALT: 19 U/L (ref 0–44)
AST: 17 U/L (ref 15–41)
Albumin: 4.3 g/dL (ref 3.5–5.0)
Alkaline Phosphatase: 81 U/L (ref 38–126)
Anion gap: 11 (ref 5–15)
BUN: 10 mg/dL (ref 6–20)
CO2: 25 mmol/L (ref 22–32)
Calcium: 9.5 mg/dL (ref 8.9–10.3)
Chloride: 106 mmol/L (ref 98–111)
Creatinine, Ser: 0.98 mg/dL (ref 0.61–1.24)
GFR calc Af Amer: >60 mL/min (ref >60)
GFR calc non Af Amer: >60 mL/min (ref >60)
Glucose, Bld: 133 mg/dL — ABNORMAL HIGH (ref 70–99)
Potassium: 4.2 mmol/L (ref 3.5–5.1)
Sodium: 142 mmol/L (ref 135–145)
Total Bilirubin: 0.4 mg/dL (ref 0.3–1.2)
Total Protein: 7.5 g/dL (ref 6.5–8.1)

## 2018-10-04 LAB — CBC WITH DIFFERENTIAL/PLATELET
Abs Immature Granulocytes: 0.04 10*3/uL (ref 0.00–0.07)
Basophils Absolute: 0 10*3/uL (ref 0.0–0.1)
Basophils Relative: 0 %
Eosinophils Absolute: 0.1 10*3/uL (ref 0.0–0.5)
Eosinophils Relative: 1 %
HCT: 45.2 % (ref 39.0–52.0)
Hemoglobin: 14.3 g/dL (ref 13.0–17.0)
Immature Granulocytes: 1 %
Lymphocytes Relative: 18 %
Lymphs Abs: 1.4 10*3/uL (ref 0.7–4.0)
MCH: 28.6 pg (ref 26.0–34.0)
MCHC: 31.6 g/dL (ref 30.0–36.0)
MCV: 90.4 fL (ref 80.0–100.0)
Monocytes Absolute: 0.6 10*3/uL (ref 0.1–1.0)
Monocytes Relative: 8 %
Neutro Abs: 5.6 10*3/uL (ref 1.7–7.7)
Neutrophils Relative %: 72 %
Platelets: 164 10*3/uL (ref 150–400)
RBC: 5 MIL/uL (ref 4.22–5.81)
RDW: 13.8 % (ref 11.5–15.5)
WBC: 7.8 10*3/uL (ref 4.0–10.5)
nRBC: 0 % (ref 0.0–0.2)

## 2018-10-04 LAB — D-DIMER, QUANTITATIVE: D-Dimer, Quant: 0.79 ug/mL-FEU — ABNORMAL HIGH (ref 0.00–0.50)

## 2018-10-05 ENCOUNTER — Other Ambulatory Visit: Payer: Self-pay | Admitting: Family Medicine

## 2018-10-05 DIAGNOSIS — K56609 Unspecified intestinal obstruction, unspecified as to partial versus complete obstruction: Secondary | ICD-10-CM | POA: Diagnosis not present

## 2018-10-05 DIAGNOSIS — Z933 Colostomy status: Secondary | ICD-10-CM | POA: Diagnosis not present

## 2018-10-10 DIAGNOSIS — Z933 Colostomy status: Secondary | ICD-10-CM | POA: Diagnosis not present

## 2018-10-10 DIAGNOSIS — K56609 Unspecified intestinal obstruction, unspecified as to partial versus complete obstruction: Secondary | ICD-10-CM | POA: Diagnosis not present

## 2018-10-11 ENCOUNTER — Other Ambulatory Visit: Payer: Self-pay

## 2018-10-11 ENCOUNTER — Inpatient Hospital Stay (HOSPITAL_BASED_OUTPATIENT_CLINIC_OR_DEPARTMENT_OTHER): Payer: BC Managed Care – PPO | Admitting: Hematology

## 2018-10-11 ENCOUNTER — Encounter (HOSPITAL_COMMUNITY): Payer: Self-pay | Admitting: Hematology

## 2018-10-11 VITALS — BP 131/72 | HR 100 | Temp 97.5°F | Resp 18 | Wt 260.3 lb

## 2018-10-11 DIAGNOSIS — Z7901 Long term (current) use of anticoagulants: Secondary | ICD-10-CM | POA: Diagnosis not present

## 2018-10-11 DIAGNOSIS — I2699 Other pulmonary embolism without acute cor pulmonale: Secondary | ICD-10-CM | POA: Diagnosis not present

## 2018-10-11 DIAGNOSIS — Z79899 Other long term (current) drug therapy: Secondary | ICD-10-CM | POA: Diagnosis not present

## 2018-10-11 DIAGNOSIS — Z801 Family history of malignant neoplasm of trachea, bronchus and lung: Secondary | ICD-10-CM | POA: Diagnosis not present

## 2018-10-11 DIAGNOSIS — F17210 Nicotine dependence, cigarettes, uncomplicated: Secondary | ICD-10-CM | POA: Diagnosis not present

## 2018-10-11 DIAGNOSIS — Z8041 Family history of malignant neoplasm of ovary: Secondary | ICD-10-CM | POA: Diagnosis not present

## 2018-10-11 DIAGNOSIS — F329 Major depressive disorder, single episode, unspecified: Secondary | ICD-10-CM | POA: Diagnosis not present

## 2018-10-11 NOTE — Assessment & Plan Note (Signed)
1.  Unprovoked pulmonary embolism: - CT chest PE protocol on 09/22/2017 shows bilateral pulmonary emboli with no evidence of right ventricular strain.  Diffuse emphysematous changes in the lungs. 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 - Patient and his mother report that he was asked to further 2 to 3 months prior to his presentation with pulmonary embolism.  However he developed abdominal distention 2 months prior to presentation.  He also had some leg swellings at that time. -He is currently on Eliquis 5 mg twice daily and is tolerating it very well.  No bleeding issues. - It is not very clear although it appears that he had unprovoked pulmonary embolism. - He is also obese with a weight around 260 pounds.  Hence I have recommended indefinite anticoagulation with full dose Eliquis.  Patient and mother are agreeable to my recommendation. -I will do yearly evaluations to assess risk-benefit ratio.
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
## 2018-10-11 NOTE — Patient Instructions (Addendum)
[place] at [facility] Discharge Instructions  You were seen today by Dr. Delton Coombes. He went over your recent lab results. He would like you to continue taking the blood thinner at the same dose you are currently taking. He will see you back in 1 year for labs and follow up.   Thank you for choosing [place] at [facility] to provide your oncology and hematology care.  To afford each patient quality time with our provider, please arrive at least 15 minutes before your scheduled appointment time.   If you have a lab appointment with the [facility] please come in thru the  Main Entrance and check in at the main information desk  You need to re-schedule your appointment should you arrive 10 or more minutes late.  We strive to give you quality time with our providers, and arriving late affects you and other patients whose appointments are after yours.  Also, if you no show three or more times for appointments you may be dismissed from the clinic at the providers discretion.     Again, thank you for choosing [facility].  Our hope is that these requests will decrease the amount of time that you wait before being seen by our physicians.       _____________________________________________________________  Should you have questions after your visit to [facility], please contact our office at [phone] between the hours of 8:00 a.m. and 4:30 p.m.  Voicemails left after 4:00 p.m. will not be returned until the following business day.  For prescription refill requests, have your pharmacy contact our office and allow 72 hours.    Cancer Center Support Programs:   > Cancer Support Group  2nd Tuesday of the month 1pm-2pm, Journey Room

## 2018-10-12 ENCOUNTER — Ambulatory Visit (INDEPENDENT_AMBULATORY_CARE_PROVIDER_SITE_OTHER): Payer: BC Managed Care – PPO | Admitting: Family Medicine

## 2018-10-12 ENCOUNTER — Other Ambulatory Visit: Payer: Self-pay

## 2018-10-12 ENCOUNTER — Encounter: Payer: Self-pay | Admitting: Family Medicine

## 2018-10-12 VITALS — Temp 98.2°F | Ht 68.0 in | Wt 257.0 lb

## 2018-10-12 DIAGNOSIS — F329 Major depressive disorder, single episode, unspecified: Secondary | ICD-10-CM

## 2018-10-12 DIAGNOSIS — E039 Hypothyroidism, unspecified: Secondary | ICD-10-CM

## 2018-10-12 DIAGNOSIS — M5440 Lumbago with sciatica, unspecified side: Secondary | ICD-10-CM | POA: Diagnosis not present

## 2018-10-12 DIAGNOSIS — F332 Major depressive disorder, recurrent severe without psychotic features: Secondary | ICD-10-CM | POA: Diagnosis not present

## 2018-10-12 DIAGNOSIS — G894 Chronic pain syndrome: Secondary | ICD-10-CM

## 2018-10-12 DIAGNOSIS — R739 Hyperglycemia, unspecified: Secondary | ICD-10-CM

## 2018-10-12 DIAGNOSIS — F419 Anxiety disorder, unspecified: Secondary | ICD-10-CM

## 2018-10-12 DIAGNOSIS — G8929 Other chronic pain: Secondary | ICD-10-CM

## 2018-10-12 DIAGNOSIS — F32A Depression, unspecified: Secondary | ICD-10-CM

## 2018-10-12 DIAGNOSIS — S069X1S Unspecified intracranial injury with loss of consciousness of 30 minutes or less, sequela: Secondary | ICD-10-CM

## 2018-10-12 NOTE — Progress Notes (Addendum)
Subjective:    Patient ID: Philip Thompson, male    DOB: [birth year], 28 y.o.   MRN: [id number]  HPI  Patient arrives to discuss disability forms. This patient has some severe underlying health issues He had a colectomy due to having severe megacolon.  Unfortunately cannot get the colectomy were reversed This disrupts his daily life it comes to the point where the colostomy bag comes off 2 or 3 times a day it takes anywhere between 20 and 30 minutes to get back on causing frequent leaks which causes hygiene issues as well as pain and discomfort because of the burning in the skin from frequent changes  Patient also has traumatic brain injury back in 2012 this is left him with some severe disability.  He has difficult time focusing and staying on track his short-term memory is not good.  He gets easily distracted.  This patient also has significant low back problems with sciatica.  He has had previous MRI.  He is not a surgical candidate because of all of his other health issues.  He has a herniated disc.  Arthritic changes.  Anterior listhesis.  This causes chronic pain causes significant troubles with twisting bending stooping.  Patient cannot bend over to use a soft soled shoes because colostomy bag often gets in the way and dislodges.  When the colostomy bag dislodges it is very difficult to get back on.  It is a two-person process takes anywhere between 15 and 25 minutes.  It is also painful because the stoma is not healing properly.  They are hoping to do surgery again in the future.  This patient also has chronic pain in the abdomen from his surgery.  He manages the pain mainly with Tylenol or occasional tramadol.  Patient is not a good candidate for narcotics.  Because of the chronic pain and discomfort with his abdomen he finds he cannot stand for any significant length of time often having to sit down for 5 to 10 minutes every hour.  Also when he is sitting he does have to get up and move  around which precludes him from sitting for any significant length of time.  He may be able to sit for 20 to 30 minutes before having to move around.  The patient is found in his experience that any significant squatting twisting bending pushing or pulling causes his colostomy bag to come off.  He is seen specialist for this and unfortunately they state they cannot do any surgical revision because of the extensive surgery he had from having his whole colon removed.  Also because the patient is on blood thinners it is not a good idea for him to go up flights of steps or ladders because of fall could trigger a severe bleed.  Because of his previous traumatic brain injury he finds himself feeling woozy off balance frequently.  This limits his mobility.  The traumatic brain injury also limits his focus and concentration.  He has a hard time processing as well.  Patient also has severe depression issues along with anxiety issues on multiple medicines for this.  Has had suicidal ideation and attempts and also hospitalizations for behavioral health he is followed by psychiatry on a monthly basis and also does counseling currently not suicidal.  But his moods disrupt his focus and ability to concentrate.  Patient also has chronic pain in the abdomen from his previous surgeries which limit his ability to move around because of the colostomy bag he  cannot do any significant lifting twisting pulling bending squatting.  Patient does have substance abuse issues in the past but no longer having substance abuse issues.  But because of this he cannot be on any type of chronic pain medicine such as hydrocodone or oxycodone therefore his pain does interfere with his ability to function  In addition to all of this patient has had a blood clot that the specialist recommends he stay on blood thinner ongoing because of this he cannot do ladders or steps because of falling would result in a deadly bleed.  Review of Systems   Constitutional: Positive for fatigue. Negative for diaphoresis.  HENT: Negative for congestion and rhinorrhea.   Respiratory: Negative for cough and shortness of breath.   Cardiovascular: Negative for chest pain and leg swelling.  Gastrointestinal: Negative for abdominal pain and diarrhea.  Musculoskeletal: Positive for arthralgias and back pain.  Skin: Negative for color change and rash.  Neurological: Negative for dizziness and headaches.  Psychiatric/Behavioral: Positive for dysphoric mood. Negative for behavioral problems and confusion.       Objective:   Physical Exam Vitals signs reviewed.  Constitutional:      General: He is not in acute distress. HENT:     Head: Normocephalic and atraumatic.  Eyes:     General:        Right eye: No discharge.        Left eye: No discharge.  Neck:     Trachea: No tracheal deviation.  Cardiovascular:     Rate and Rhythm: Normal rate and regular rhythm.     Heart sounds: Normal heart sounds. No murmur.  Pulmonary:     Effort: Pulmonary effort is normal. No respiratory distress.     Breath sounds: Normal breath sounds.  Lymphadenopathy:     Cervical: No cervical adenopathy.  Skin:    General: Skin is warm and dry.  Neurological:     Mental Status: He is alert.     Coordination: Coordination normal.  Psychiatric:        Behavior: Behavior normal.    Morbid obesity Colostomy bag noted Surgical scars on abdomen noted  Recent lab work shows hyperglycemia therefore check A1c A1c came back looking good [place] cognitive assessment overall good at a score of 25 but patient does have short-term memory dysfunction     Assessment & Plan:  This patient has colostomy bag which unfortunately comes off frequently and unfortunately cannot be revised this poses a significant problem for him  He also has chronic pain which interferes with ability to do things  Pulmonary embolism patient also has chronic blood thinner  Morbid obesity  patient is trying to lose weight  Hypothyroidism  Traumatic brain injury history  Patient is disabled please see disability forms  It should be noted that his colostomy bag cannot be reversed.  At least twice a day it comes off.  It takes anywhere from 10 to 30-minute process to changes.  It is a 2 person job.  It is very painful when he gets a stone.  He also has a lot of leaking and bursting.  History of lumbar pain with anterior listhesis herniated disc and degenerative disease.  This limits his ability to twist turn bend and stoop squat lift.  There is no surgical correction for this  Patient also has traumatic brain injury back in 2012.  Because of this he suffers with mood fluctuations also significant amount of anxiety difficulty focusing and following through he is  followed by psychiatry he is on multiple medications that caused significant drowsiness and side effects that affect his thinking.  Patient also has chronic anticoagulation should not go up on ladders or high places because of the risk of falls with significant bleeding  Because of the patient's multiple health issues he also suffers with weakness muscle weakness low energy difficult time doing physical labor.  Difficult time doing repetitive actions.  Because of his cognitive issues he has a difficult time with focusing and staying on time and following through on directions.  In my opinion this patient would not be able to work a standard job.  He would require a inordinate amount of breaks and accommodations which would make it impossible for him to work a standard 40-hour job no matter how sedentary it may be.  This patient would have to take significant amount of breaks also to because of flareups of his various conditions he would miss a inordinate amount of days per month which would make it impossible for him to keep a job.  I also feel that the patient would miss multiple days of work because of difficulties with his  colostomy bag.  In addition to this his cognitive skills are not quick due to the traumatic brain injury.  And because of his chronic depression disc creates difficulty for him processing understanding and remembering task.  I do not see how this person would be able to hold down a standard 40-hour week job

## 2018-10-19 ENCOUNTER — Telehealth: Payer: Self-pay | Admitting: Family Medicine

## 2018-10-19 NOTE — Telephone Encounter (Signed)
Patient had a sheet that need to be filled out and sent with last office visit.I need sheet so I can send it records please.

## 2018-10-23 NOTE — Telephone Encounter (Signed)
I have finished the forms.  They are with the green folder. There is a form from his Chief Executive Officer. I also highly recommend to send an office visit note from the most recent visit with this. Thank you for your help

## 2018-10-24 DIAGNOSIS — K56609 Unspecified intestinal obstruction, unspecified as to partial versus complete obstruction: Secondary | ICD-10-CM | POA: Diagnosis not present

## 2018-10-24 DIAGNOSIS — Z933 Colostomy status: Secondary | ICD-10-CM | POA: Diagnosis not present

## 2018-10-26 DIAGNOSIS — Z029 Encounter for administrative examinations, unspecified: Secondary | ICD-10-CM

## 2018-10-27 ENCOUNTER — Emergency Department (HOSPITAL_COMMUNITY)
Admission: EM | Admit: 2018-10-27 | Discharge: 2018-10-27 | Disposition: A | Discharge status: Home / Self Care | Payer: BC Managed Care – PPO | Attending: Emergency Medicine | Admitting: Emergency Medicine

## 2018-10-27 ENCOUNTER — Encounter (HOSPITAL_COMMUNITY): Payer: Self-pay

## 2018-10-27 ENCOUNTER — Other Ambulatory Visit: Payer: Self-pay

## 2018-10-27 DIAGNOSIS — R1110 Vomiting, unspecified: Secondary | ICD-10-CM | POA: Diagnosis not present

## 2018-10-27 DIAGNOSIS — Z5321 Procedure and treatment not carried out due to patient leaving prior to being seen by health care provider: Secondary | ICD-10-CM | POA: Insufficient documentation

## 2018-10-27 LAB — COMPREHENSIVE METABOLIC PANEL
ALT: 29 U/L (ref 0–44)
AST: 24 U/L (ref 15–41)
Albumin: 5.2 g/dL — ABNORMAL HIGH (ref 3.5–5.0)
Alkaline Phosphatase: 82 U/L (ref 38–126)
Anion gap: 10 (ref 5–15)
BUN: 8 mg/dL (ref 6–20)
CO2: 25 mmol/L (ref 22–32)
Calcium: 9.5 mg/dL (ref 8.9–10.3)
Chloride: 102 mmol/L (ref 98–111)
Creatinine, Ser: 0.96 mg/dL (ref 0.61–1.24)
GFR calc Af Amer: >60 mL/min (ref >60)
GFR calc non Af Amer: >60 mL/min (ref >60)
Glucose, Bld: 130 mg/dL — ABNORMAL HIGH (ref 70–99)
Potassium: 3.9 mmol/L (ref 3.5–5.1)
Sodium: 137 mmol/L (ref 135–145)
Total Bilirubin: 0.8 mg/dL (ref 0.3–1.2)
Total Protein: 8.7 g/dL — ABNORMAL HIGH (ref 6.5–8.1)

## 2018-10-27 LAB — CBC
HCT: 50.9 % (ref 39.0–52.0)
Hemoglobin: 16.2 g/dL (ref 13.0–17.0)
MCH: 28.5 pg (ref 26.0–34.0)
MCHC: 31.8 g/dL (ref 30.0–36.0)
MCV: 89.6 fL (ref 80.0–100.0)
Platelets: 216 10*3/uL (ref 150–400)
RBC: 5.68 MIL/uL (ref 4.22–5.81)
RDW: 13.5 % (ref 11.5–15.5)
WBC: 9.8 10*3/uL (ref 4.0–10.5)
nRBC: 0 % (ref 0.0–0.2)

## 2018-10-27 LAB — LIPASE, BLOOD: Lipase: 25 U/L (ref 11–51)

## 2018-10-27 NOTE — ED Triage Notes (Signed)
Pt brought to ED with complaints of emesis, unable to keep anything down since last night.

## 2018-11-02 DIAGNOSIS — F3181 Bipolar II disorder: Secondary | ICD-10-CM | POA: Diagnosis not present

## 2018-11-08 DIAGNOSIS — Z933 Colostomy status: Secondary | ICD-10-CM | POA: Diagnosis not present

## 2018-11-08 DIAGNOSIS — K56609 Unspecified intestinal obstruction, unspecified as to partial versus complete obstruction: Secondary | ICD-10-CM | POA: Diagnosis not present

## 2018-11-10 ENCOUNTER — Other Ambulatory Visit: Payer: Self-pay | Admitting: Family Medicine

## 2018-11-11 ENCOUNTER — Other Ambulatory Visit: Payer: Self-pay | Admitting: Family Medicine

## 2018-11-13 DIAGNOSIS — Z933 Colostomy status: Secondary | ICD-10-CM | POA: Diagnosis not present

## 2018-11-13 DIAGNOSIS — K56609 Unspecified intestinal obstruction, unspecified as to partial versus complete obstruction: Secondary | ICD-10-CM | POA: Diagnosis not present

## 2018-12-01 ENCOUNTER — Other Ambulatory Visit: Payer: Self-pay | Admitting: Family Medicine

## 2018-12-20 ENCOUNTER — Other Ambulatory Visit: Payer: Self-pay | Admitting: *Deleted

## 2018-12-20 ENCOUNTER — Telehealth: Payer: Self-pay | Admitting: Family Medicine

## 2018-12-20 MED ORDER — PANTOPRAZOLE SODIUM 40 MG PO TBEC: 40.0000 mg | DELAYED_RELEASE_TABLET | Freq: Every day | ORAL | 3 refills | Status: AC

## 2018-12-20 MED ORDER — PROPRANOLOL HCL 40 MG PO TABS: 40.0000 mg | ORAL_TABLET | Freq: Two times a day (BID) | ORAL | 3 refills | Status: AC

## 2018-12-20 NOTE — Telephone Encounter (Signed)
Med sent to pharm. Tried to call and notify pt but phone does not ring. Tried multiple times today.

## 2018-12-20 NOTE — Telephone Encounter (Signed)
May have 4 months of refill on each Follow-up by spring

## 2018-12-20 NOTE — Telephone Encounter (Signed)
Needs refills on : pantoprazole (PROTONIX) 40 MG tablet and propranolol (INDERAL) 40 MG tablet     [facility] [employer]

## 2018-12-21 NOTE — Telephone Encounter (Signed)
Pt.notified

## 2018-12-28 DIAGNOSIS — K56609 Unspecified intestinal obstruction, unspecified as to partial versus complete obstruction: Secondary | ICD-10-CM | POA: Diagnosis not present

## 2018-12-28 DIAGNOSIS — Z933 Colostomy status: Secondary | ICD-10-CM | POA: Diagnosis not present

## 2019-01-05 DIAGNOSIS — K56609 Unspecified intestinal obstruction, unspecified as to partial versus complete obstruction: Secondary | ICD-10-CM | POA: Diagnosis not present

## 2019-01-05 DIAGNOSIS — Z933 Colostomy status: Secondary | ICD-10-CM | POA: Diagnosis not present

## 2019-01-09 ENCOUNTER — Other Ambulatory Visit: Payer: Self-pay | Admitting: Family Medicine

## 2019-01-16 DIAGNOSIS — Z933 Colostomy status: Secondary | ICD-10-CM | POA: Diagnosis not present

## 2019-01-16 DIAGNOSIS — K56609 Unspecified intestinal obstruction, unspecified as to partial versus complete obstruction: Secondary | ICD-10-CM | POA: Diagnosis not present

## 2019-02-27 ENCOUNTER — Ambulatory Visit (INDEPENDENT_AMBULATORY_CARE_PROVIDER_SITE_OTHER): Payer: Medicaid Other | Admitting: Family Medicine

## 2019-02-27 ENCOUNTER — Other Ambulatory Visit: Payer: Self-pay

## 2019-02-27 DIAGNOSIS — R109 Unspecified abdominal pain: Secondary | ICD-10-CM

## 2019-02-27 DIAGNOSIS — G894 Chronic pain syndrome: Secondary | ICD-10-CM | POA: Diagnosis not present

## 2019-02-27 DIAGNOSIS — E039 Hypothyroidism, unspecified: Secondary | ICD-10-CM

## 2019-02-27 MED ORDER — TRAMADOL HCL 50 MG PO TABS: ORAL_TABLET | ORAL | 0 refills | Status: AC

## 2019-02-27 MED ORDER — DICYCLOMINE HCL 10 MG PO CAPS: ORAL_CAPSULE | ORAL | 3 refills | Status: AC

## 2019-02-27 MED ORDER — LEVOTHYROXINE SODIUM 75 MCG PO TABS: ORAL_TABLET | ORAL | 5 refills | Status: AC

## 2019-02-27 MED ORDER — AMOXICILLIN 500 MG PO TABS: 500.0000 mg | ORAL_TABLET | Freq: Three times a day (TID) | ORAL | 0 refills | Status: AC

## 2019-02-27 NOTE — Progress Notes (Signed)
Subjective:    Patient ID: Philip Thompson, male    DOB: 1990/02/16, 29 y.o.   MRN: [id number]  Abdominal Pain This is a new problem. Episode onset: one to two months off and on. Pain location: mid left side. Pertinent negatives include no arthralgias, diarrhea, dysuria, headaches, hematuria, nausea or vomiting.  Patient with significant intermittent abdominal pain more on the left side in the left lower abdomen.  He states it is a sharp pain aching at times denies blood or mucus in the stools.  States nothing seems to really help it.  Does not cause fever chills vomiting.  Does not cause bloody stools.  Sore throat and cough for 4 days. Tried nyquil and dayquil.  Patient does relate some sore throat and cough over the past few days he states he has been totally isolated and so is his mom so he does not feel he has Covid.  He denies chest tightness pressure pain or shortness of breath.  Denies muscle aches sweats chills wheezing or difficulty breathing  Needs refill on tramadol.  Patient has chronic pain.  The pain in the abdomen related to previous surgeries plus also pain in his back he is requesting a prescription of tramadol he states he uses it responsibly and does not overuse it.  States it does not cause drowsiness for him.  Virtual Visit via Video Note  I connected with [place] on 02/27/19 at  3:00 PM EST by a video enabled telemedicine application and verified that I am speaking with the correct person using two identifiers.  Location: Patient: home Provider: office   I discussed the limitations of evaluation and management by telemedicine and the availability of in person appointments. The patient expressed understanding and agreed to proceed.  History of Present Illness:    Observations/Objective:   Assessment and Plan:   Follow Up Instructions:    I discussed the assessment and treatment plan with the patient. The patient was provided an opportunity to ask  questions and all were answered. The patient agreed with the plan and demonstrated an understanding of the instructions.   The patient was advised to call back or seek an in-person evaluation if the symptoms worsen or if the condition fails to improve as anticipated.  I provided 30 minutes of non-face-to-face time during this encounter.  30 minutes spent between reviewing chart reviewing previous records also talking with the patient formulation of the plan plus also documentation       Review of Systems  Constitutional: Negative for activity change.  HENT: Negative for congestion and rhinorrhea.   Respiratory: Negative for cough and shortness of breath.   Cardiovascular: Negative for chest pain.  Gastrointestinal: Positive for abdominal pain. Negative for diarrhea, nausea and vomiting.  Genitourinary: Negative for dysuria and hematuria.  Musculoskeletal: Positive for back pain. Negative for arthralgias.  Neurological: Negative for weakness and headaches.  Psychiatric/Behavioral: Negative for behavioral problems and confusion.       Objective:   Physical Exam  Patient had virtual visit Appears to be in no distress Atraumatic Neuro able to relate and oriented No apparent resp distress Color normal       Assessment & Plan:  1. Hypothyroidism, unspecified type Patient is due for his blood work.  We will send him papers to check his thyroid function.  Medication was sent into the drugstore we will check a TSH free T4  2. Chronic pain syndrome He has chronic pain from his back tramadol is  helpful for him he does not abuse it does not use it frequently a refill will be sent in  3. Abdominal pain, unspecified abdominal location This abdominal pain I doubt that it is an abscess or tumor more likely is abdominal discomfort related to previous surgeries dicyclomine 3 times daily as needed is reasonable but if it does not improve over the next 7 to 14 days I do recommend an in  person visit.  We will check a CBC liver lipase met 7 along with a TSH

## 2019-02-28 ENCOUNTER — Other Ambulatory Visit: Payer: Self-pay | Admitting: *Deleted

## 2019-02-28 DIAGNOSIS — R109 Unspecified abdominal pain: Secondary | ICD-10-CM

## 2019-02-28 DIAGNOSIS — E039 Hypothyroidism, unspecified: Secondary | ICD-10-CM

## 2019-02-28 NOTE — Progress Notes (Signed)
Orders put in. Pt notified.  

## 2019-03-19 DEATH — deceased

## 2019-10-11 IMAGING — DX DG CHEST 2V
2 series · 2 of 2 positions shown · non-contrast
Comparison: CT abdomen pelvis dated September 15, 2017. Chest x-ray
dated August 27, 2013.

CLINICAL DATA: Hemoptysis.

EXAM:
CHEST - 2 VIEW

[chest pa]
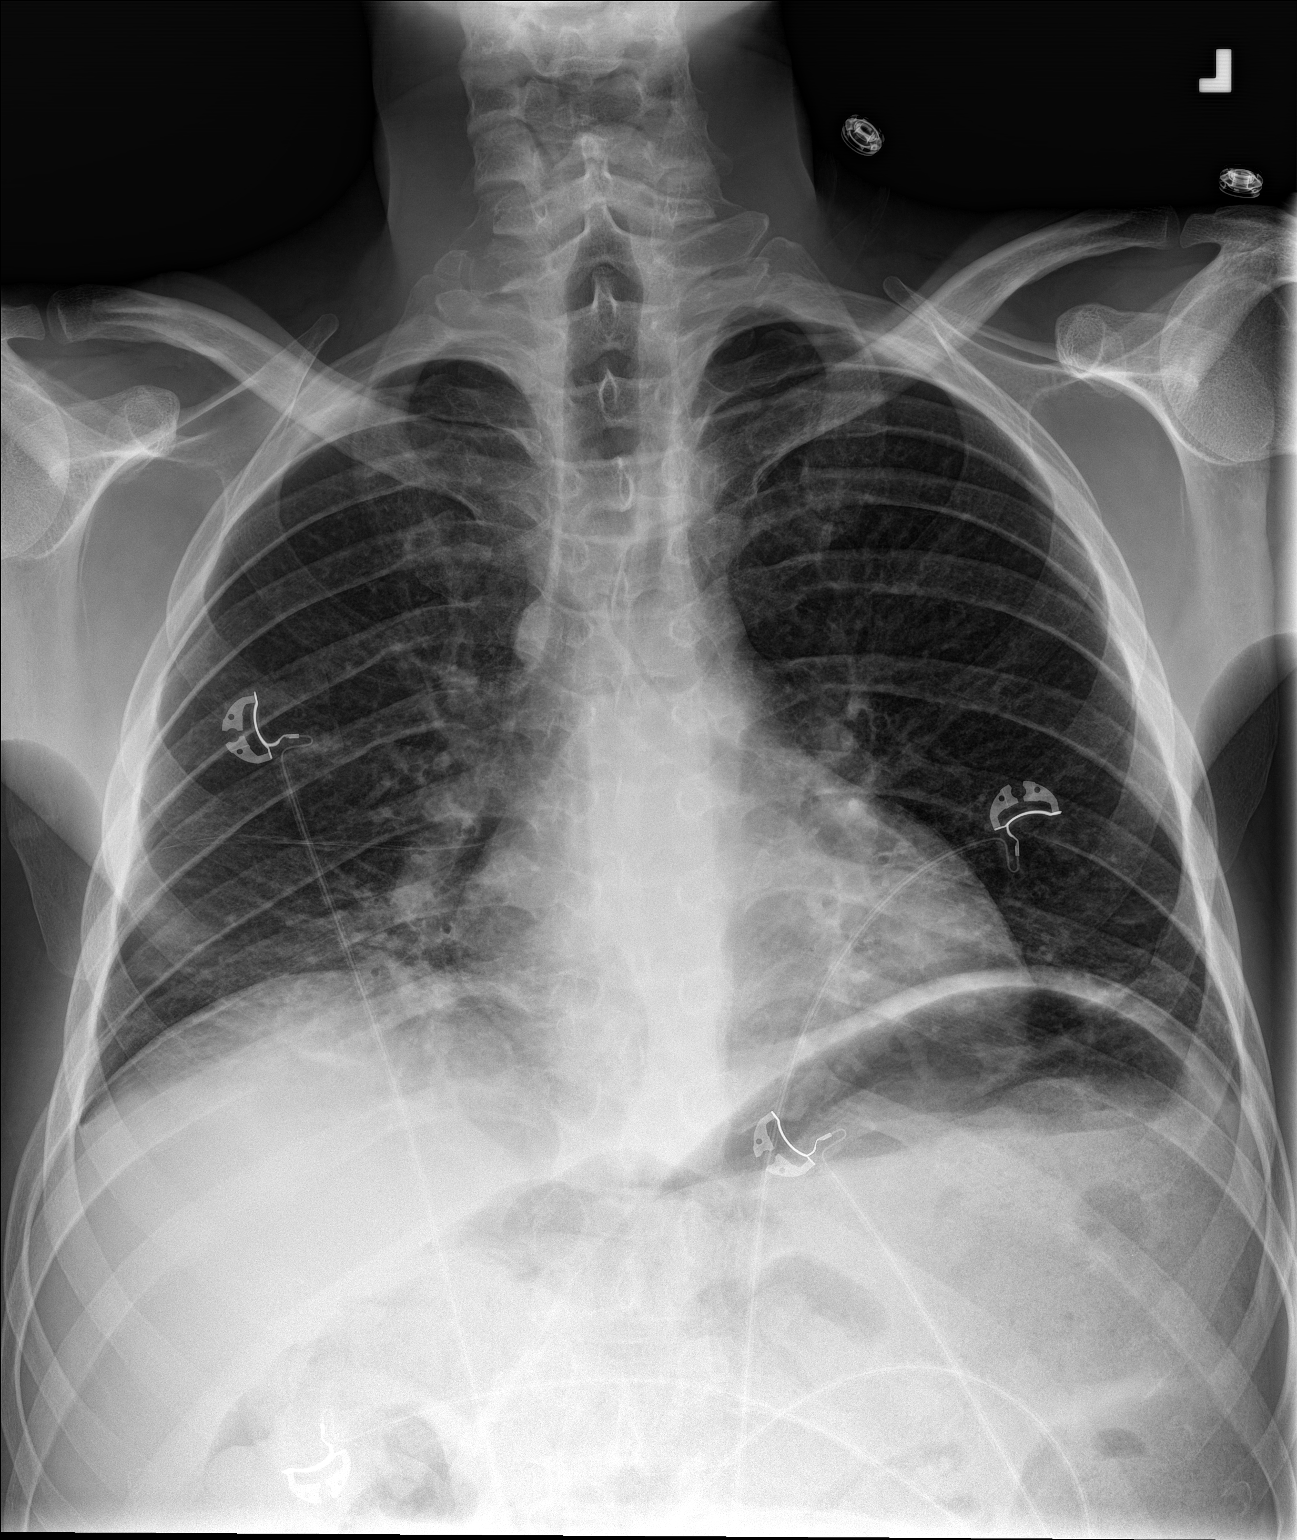

[chest lat]
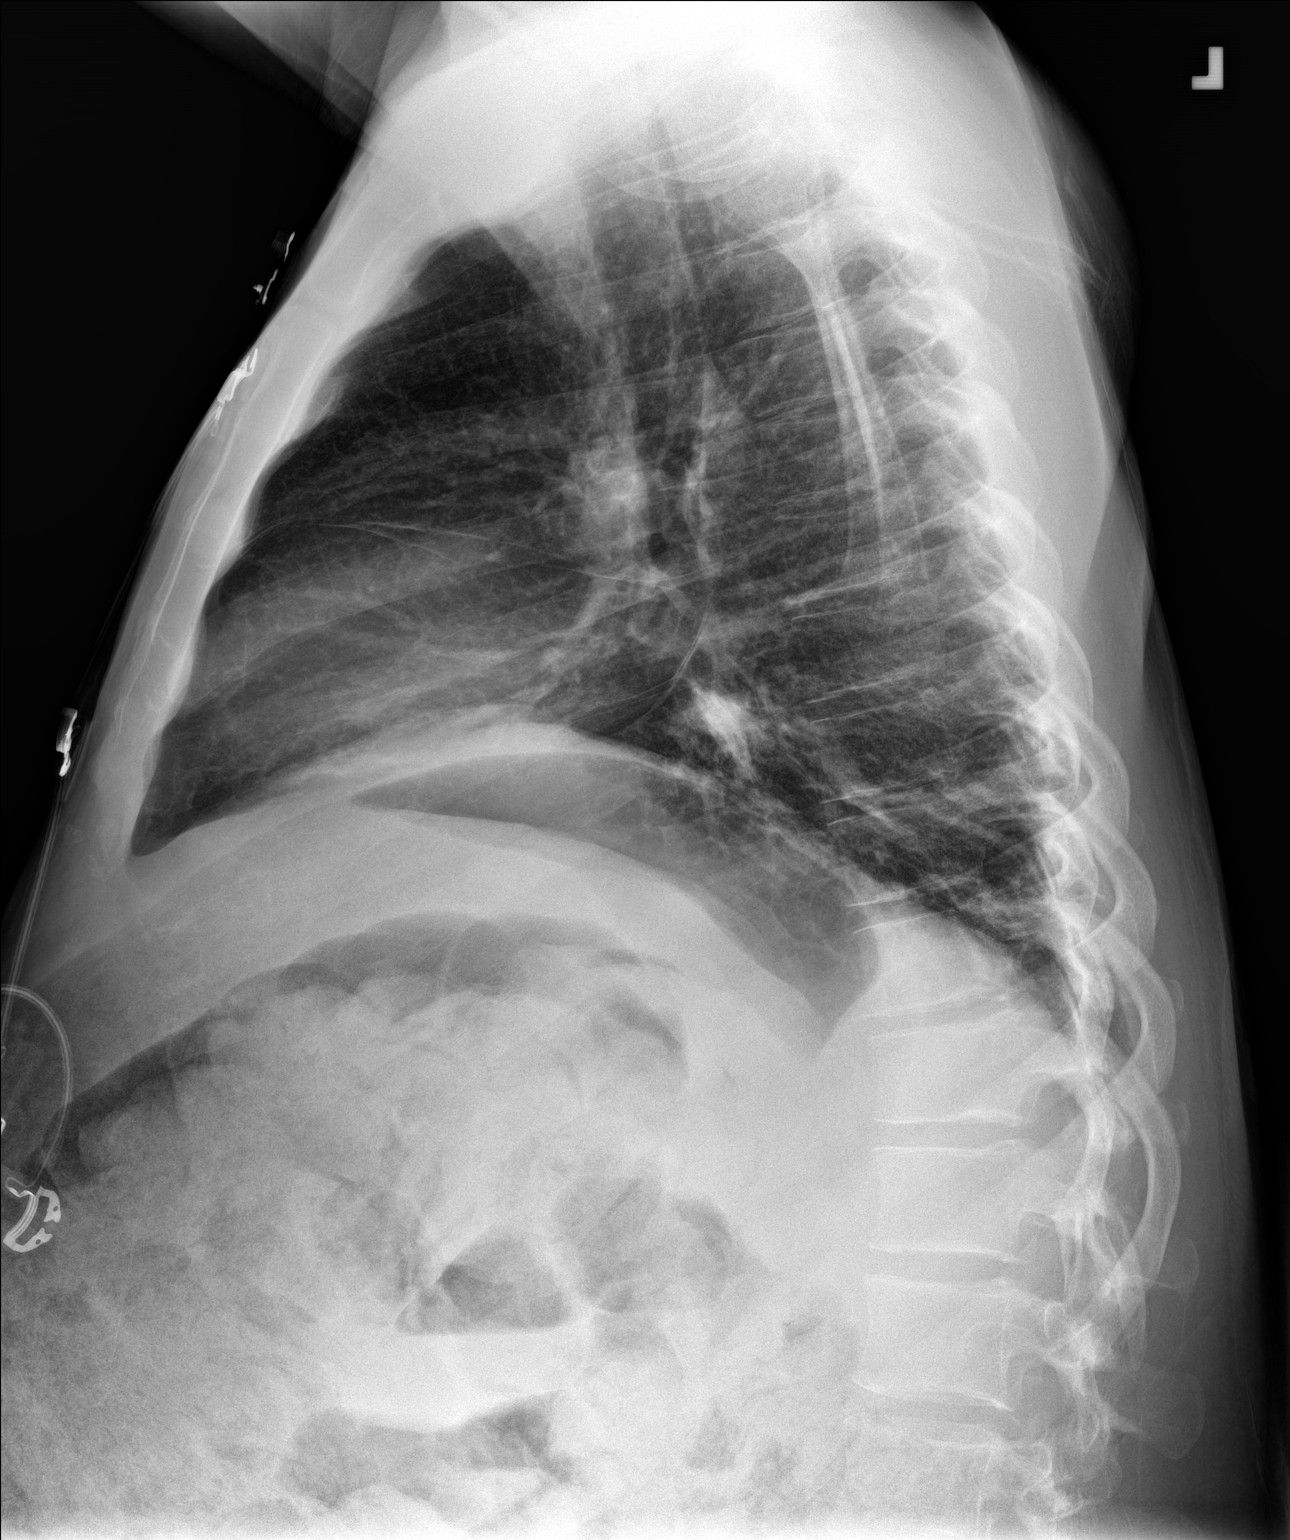

[2 of 2 positions shown; findings below may reference images not displayed]

FINDINGS: The heart size and mediastinal contours are within normal limits.
Normal pulmonary vascularity. Mild subsegmental atelectasis in the
right middle lobe and bilateral lower lobes. Nodular density in the
lower lobes on the lateral view likely represents a vessel, as no
discrete nodule is seen in this area on recent CT abdomen from 2
days ago. No acute osseous abnormality. Unchanged markedly distended
colon in the upper abdomen.
IMPRESSION: 1. Bibasilar atelectasis.

## 2019-10-17 ENCOUNTER — Other Ambulatory Visit (HOSPITAL_COMMUNITY): Payer: BC Managed Care – PPO

## 2019-10-23 IMAGING — DX DG CHEST 1V PORT
1 series · 1 of 1 positions shown · non-contrast
Comparison: 09/29/2017

CLINICAL DATA: Intubation.  Cardiomyopathy.

EXAM:
PORTABLE CHEST 1 VIEW

[chest ap]
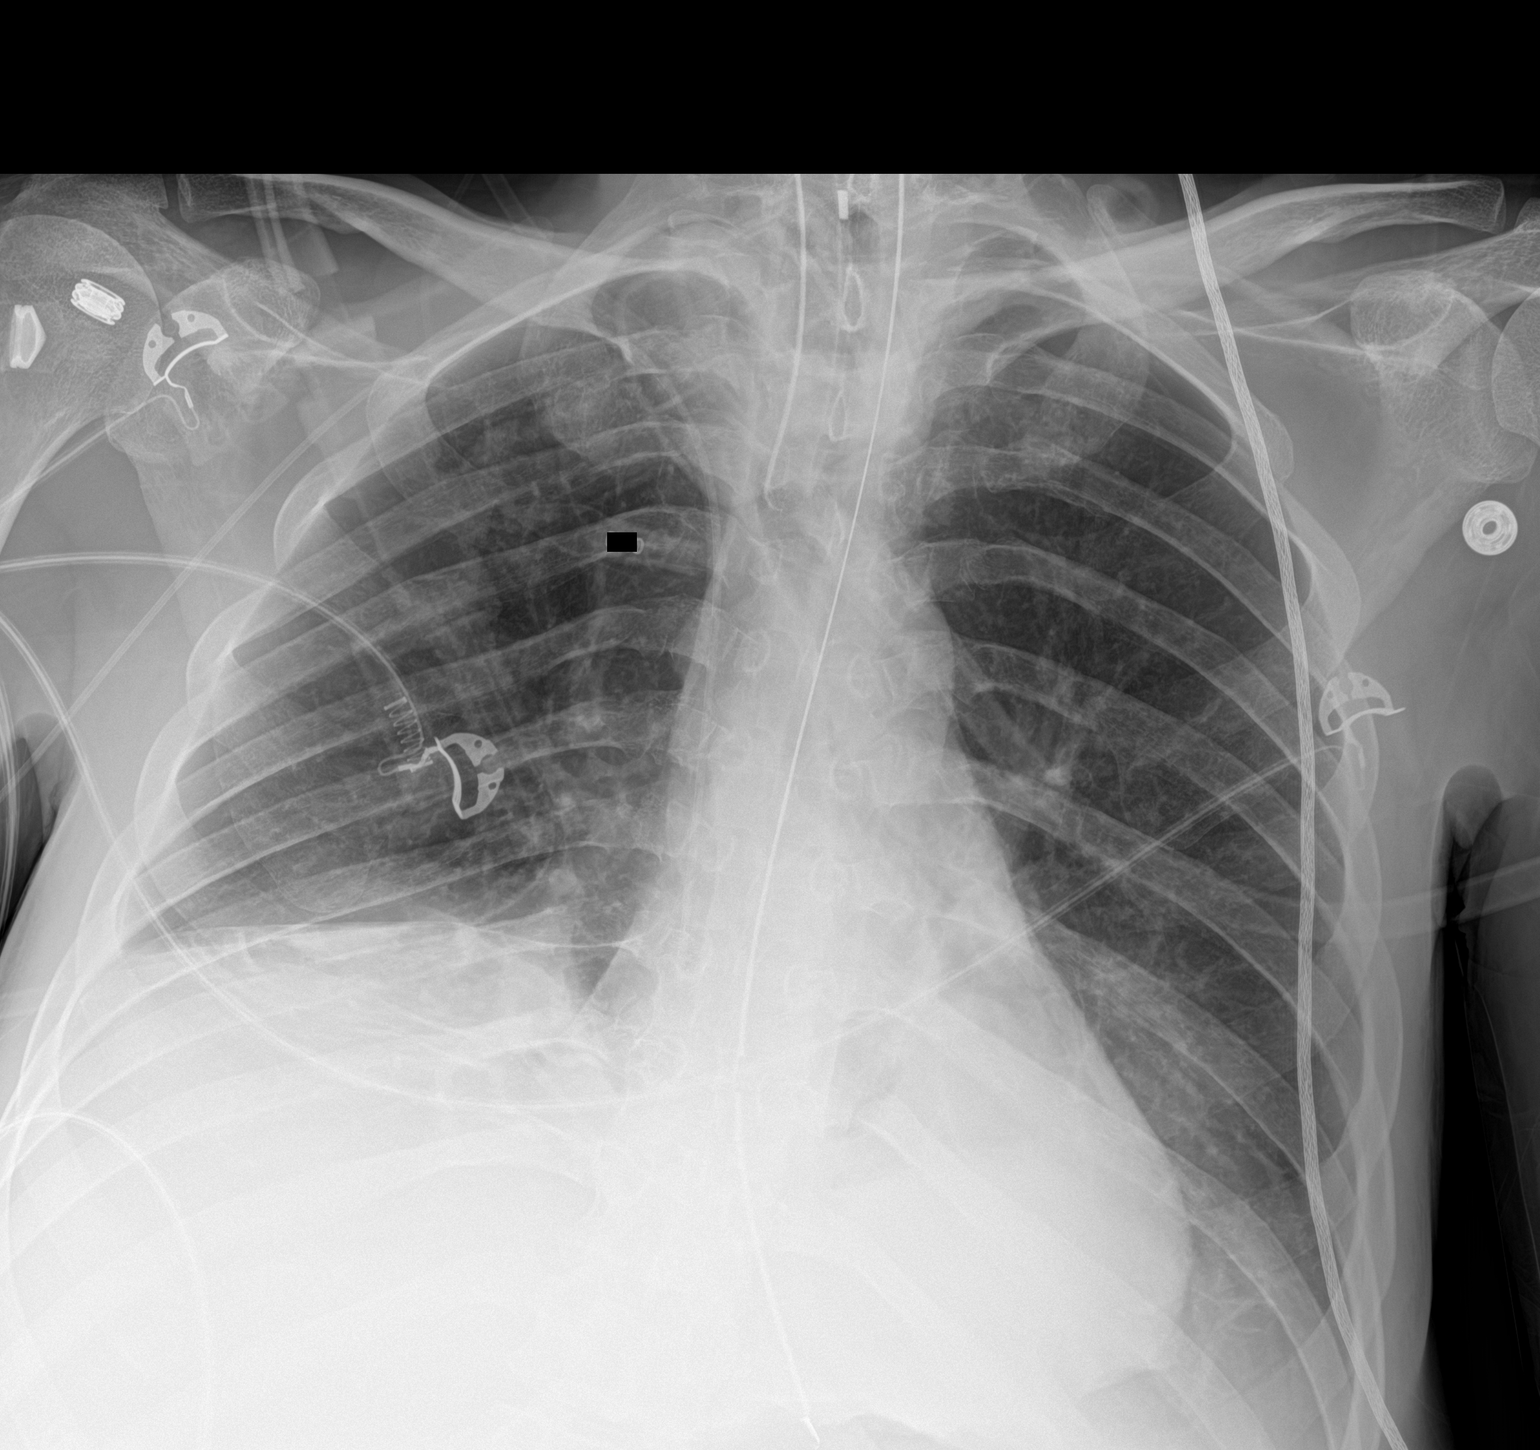

[1 of 1 positions shown; findings below may reference images not displayed]

FINDINGS: Endotracheal tube in satisfactory position. Enteric catheter has
been partially retracted and the side hole is superior to the
expected location of the GE junction. Right PICC line in stable
position.

Cardiomediastinal silhouette is normal. Mediastinal contours appear
intact.

No evidence of pneumothorax. Linear bilateral airspace opacities in
the lower lobes. Improved right pleural effusion.

Osseous structures are without acute abnormality. Soft tissues are
grossly normal.
IMPRESSION: Status post intubation.

Enteric catheter has been partially retracted and the side hole is
superior to the expected location of the GE junction. Readjustment
recommended.

Bilateral linear lower lobe airspace opacities may represent
atelectasis or consolidation.

Improved right pleural effusion.

## 2019-10-24 ENCOUNTER — Ambulatory Visit (HOSPITAL_COMMUNITY): Payer: BC Managed Care – PPO | Admitting: Hematology

## 2019-10-24 IMAGING — CT CT ABD-PELV W/ CM
2 of 4 series · 15 of 46 positions shown, 17 images · IV contrast (APPLIED)
Comparison: 09/22/2017 and 09/15/2016

CLINICAL DATA: Post subtotal colectomy with ileostomy. Blood out of
JP drain.

EXAM:
CT ABDOMEN AND PELVIS WITH CONTRAST
TECHNIQUE: Multidetector CT imaging of the abdomen and pelvis was performed
using the standard protocol following bolus administration of
intravenous contrast.
CONTRAST:  100mL OMNIPAQUE IOHEXOL 300 MG/ML  SOLN

[Series 3: abd/ pelvis 5.0 i30f 2 · axial · 0.88mm/px · z∈[+836,+1301]mm · 12 of 107 slices shown, 14 images]
[im 9/107  soft-tissue]
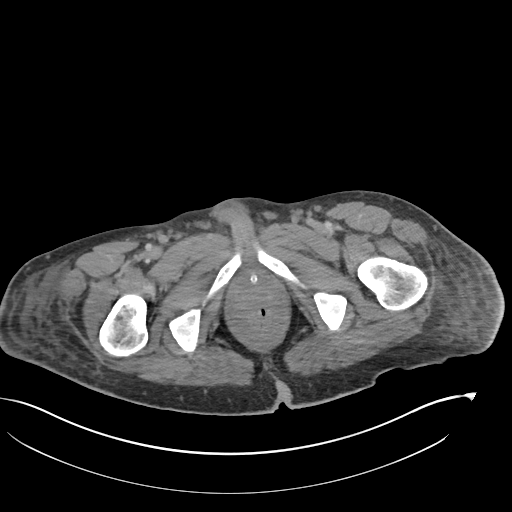
[im 9/107  bone]
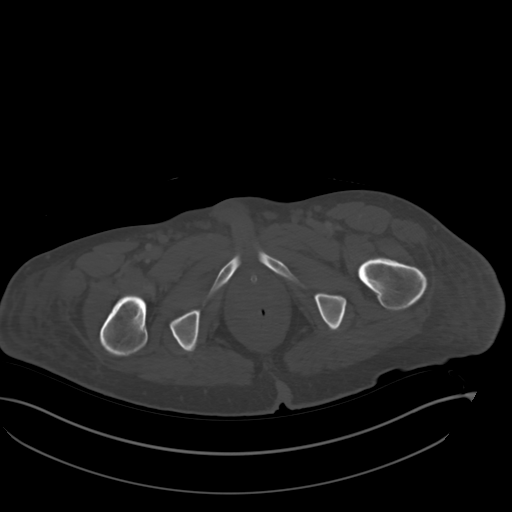
[im 17/107  soft-tissue]
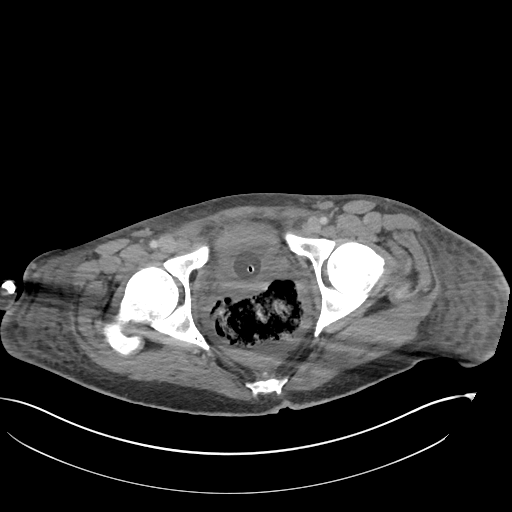
[im 26/107  soft-tissue]
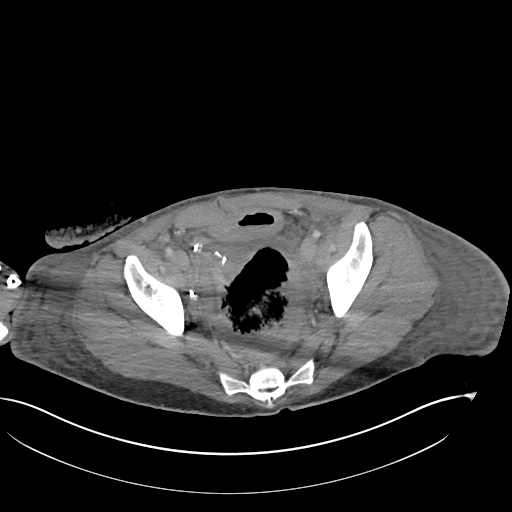
[im 34/107  soft-tissue]
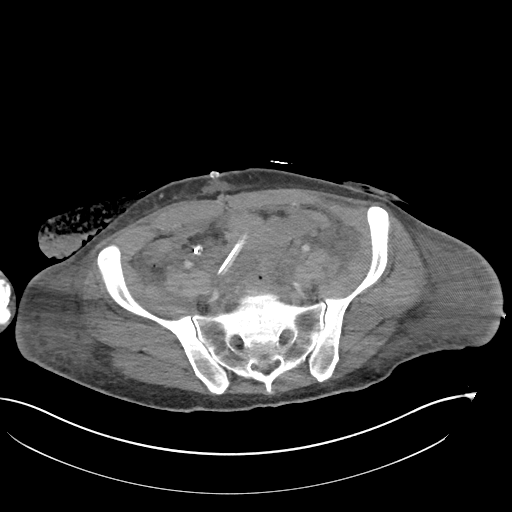
[im 43/107  soft-tissue]
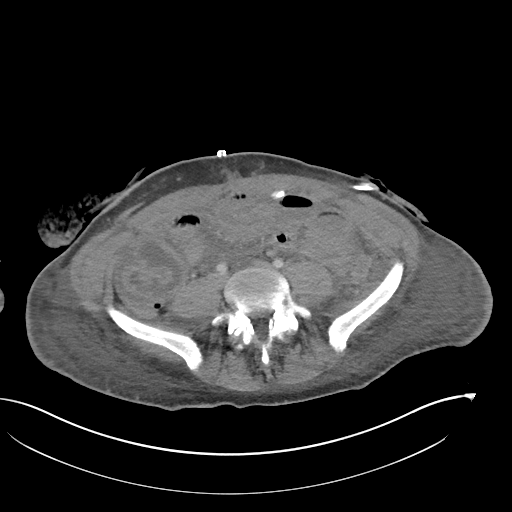
[im 51/107  soft-tissue]
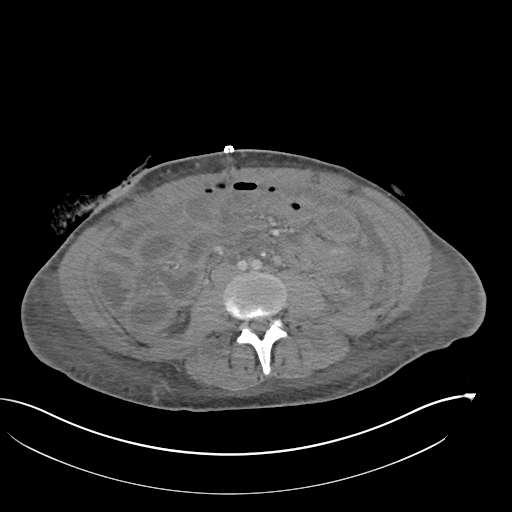
[im 60/107  soft-tissue]
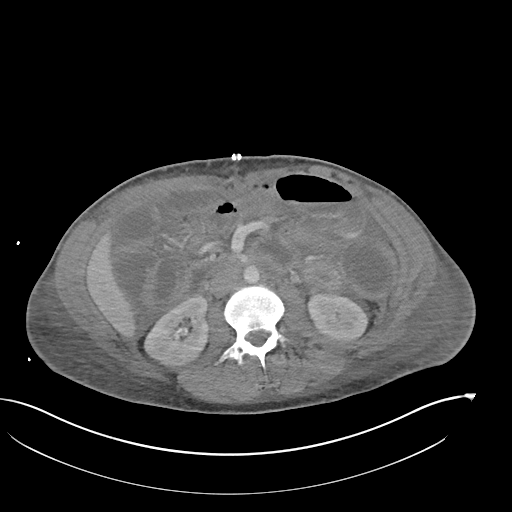
[im 68/107  soft-tissue]
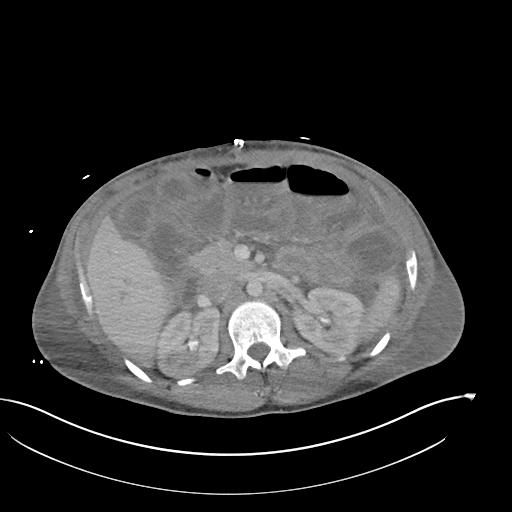
[im 77/107  soft-tissue]
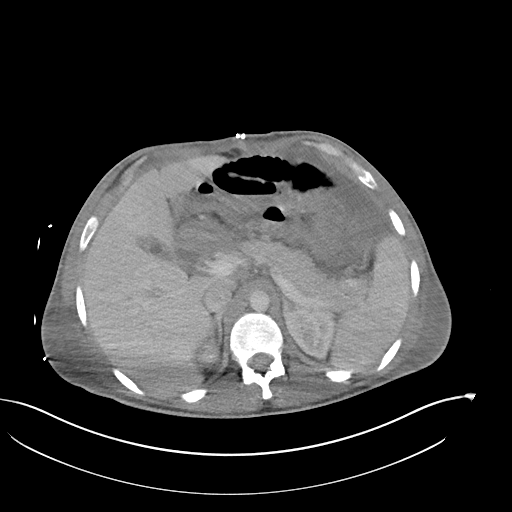
[im 77/107  bone]
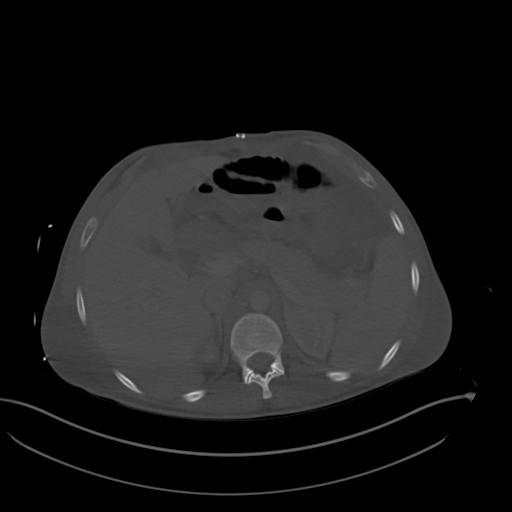
[im 85/107  soft-tissue]
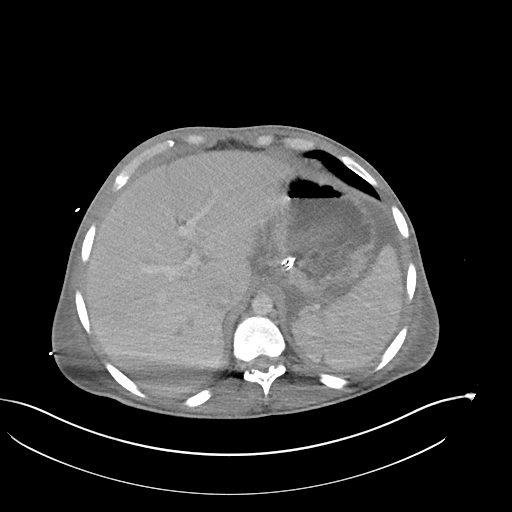
[im 94/107  soft-tissue]
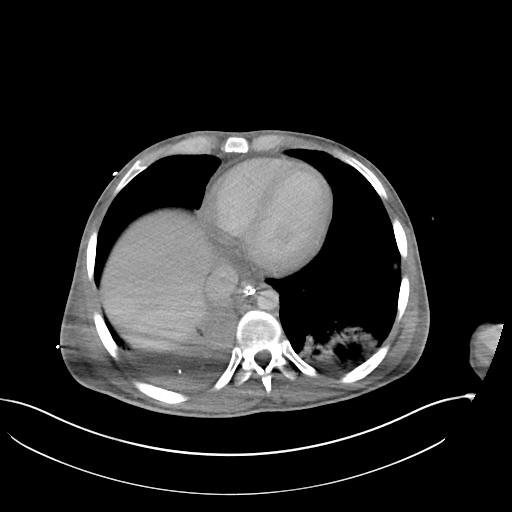
[im 102/107  soft-tissue]
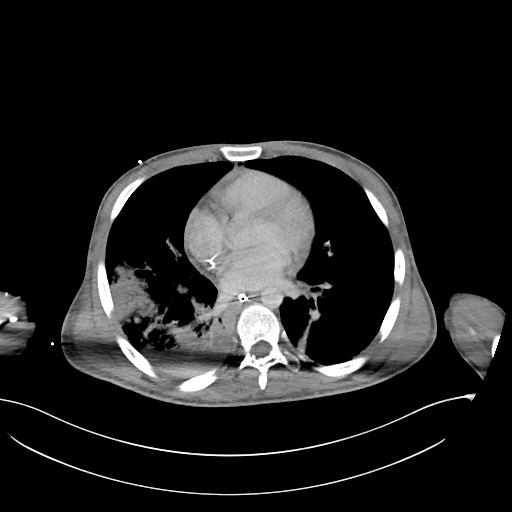

[Series 6: coronal soft tissue · coronal · 0.96mm/px · 3 of 101 slices shown]
[im 34/101  soft-tissue]
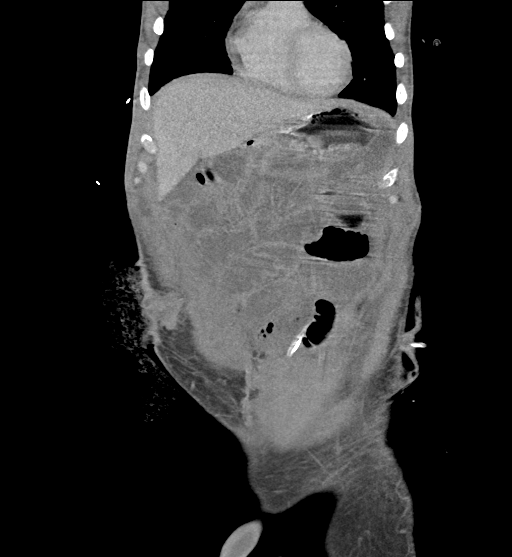
[im 45/101  soft-tissue]
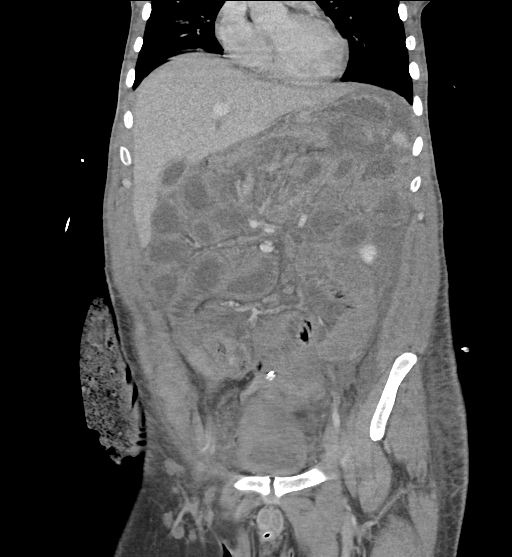
[im 56/101  soft-tissue]
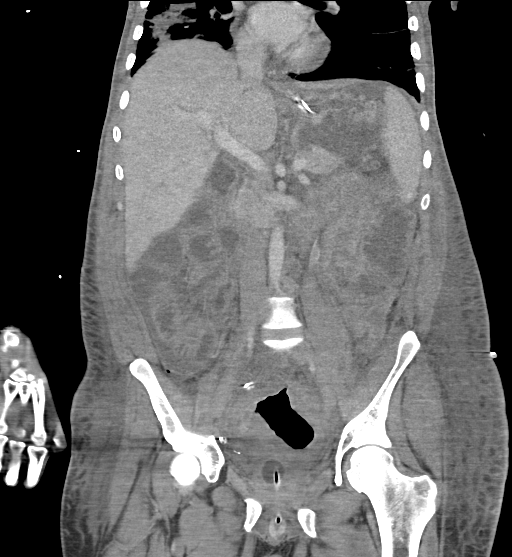

[15 of 46 positions shown; findings below may reference images not displayed]

FINDINGS: Lower chest: Patchy airspace consolidation over the right lower lobe
which may be due to atelectasis or infection. Small right pleural
effusion. Linear density over the left lower lobe likely
atelectasis. Mild patchy peripheral opacification over the lingula
and left lower lobe which may be due to atelectasis or infection.

Hepatobiliary: Gallbladder somewhat contracted. Liver and biliary
tree are within normal.

Pancreas: Normal.

Spleen: Normal.

Adrenals/Urinary Tract: Adrenal glands are normal. Kidneys are
normal in size without hydronephrosis or nephrolithiasis. Foley
catheter is present within the bladder which is somewhat
decompressed.

Stomach/Bowel: Nasogastric tube has tip just below the
gastroesophageal junction. Border of the stomach is difficult to
accurately identified. Patient has undergone interval subtotal
colectomy with ileostomy. Ileostomy site over the right lower
quadrant is unremarkable. There are several fluid-filled mildly
dilated small bowel loops over the upper abdomen likely
postoperative ileus. Several small bowel loops have mild wall
thickening and mucosal enhancement likely postoperative although can
be seen due to regional enteritis of infectious or inflammatory
nature. No definite free peritoneal air. Is difficult to follow the
course of the remaining colon. There is moderate fecal retention
over the rectum with mild rectal wall thickening with significant
improvement compared to the previous exam.

Vascular/Lymphatic: Within normal.

Reproductive: Within normal.

Other: No definite intra-abdominal abscess. Mild diffuse
subcutaneous edema over the lower abdomen/pelvis. Skin staples
vertically over the midline abdomen. There is a percutaneous drain
entering the left anterior lower abdomen with tip over the right
lower quadrant.

Musculoskeletal: Unchanged.
IMPRESSION: Evidence of patient's recent subtotal colectomy and ileostomy.
Ileostomy site is unremarkable. Several fluid-filled mildly dilated
small bowel loops over the upper abdomen with mucosal enhancement
and mild wall thickening. Findings are likely postoperative,
although regional enteritis of infectious or inflammatory nature
could produce similar findings. Percutaneous drain with tip over the
right lower quadrant.

Persistent mild rectal wall thickening with significantly improved
fecal retention over the rectum.

Worsening patchy bilateral airspace opacification within the lung
bases right worse than left which may be due to atelectasis or
infection. Small right pleural effusion.

Enteric tube with tip just below the gastroesophageal junction.
